# Patient Record
Sex: Female | Born: 1965
Health system: Southern US, Community
[De-identification: ages and names within clinical notes are randomized; demographics above are authoritative.]

## PROBLEM LIST (undated history)

## (undated) DIAGNOSIS — M47812 Spondylosis without myelopathy or radiculopathy, cervical region: Secondary | ICD-10-CM

## (undated) DIAGNOSIS — G43909 Migraine, unspecified, not intractable, without status migrainosus: Secondary | ICD-10-CM

## (undated) DIAGNOSIS — I10 Essential (primary) hypertension: Secondary | ICD-10-CM

## (undated) DIAGNOSIS — F445 Conversion disorder with seizures or convulsions: Secondary | ICD-10-CM

## (undated) DIAGNOSIS — R630 Anorexia: Secondary | ICD-10-CM

## (undated) DIAGNOSIS — F32A Depression, unspecified: Secondary | ICD-10-CM

## (undated) DIAGNOSIS — F329 Major depressive disorder, single episode, unspecified: Secondary | ICD-10-CM

## (undated) DIAGNOSIS — E785 Hyperlipidemia, unspecified: Secondary | ICD-10-CM

## (undated) DIAGNOSIS — F419 Anxiety disorder, unspecified: Secondary | ICD-10-CM

## (undated) DIAGNOSIS — F1010 Alcohol abuse, uncomplicated: Secondary | ICD-10-CM

## (undated) DIAGNOSIS — R569 Unspecified convulsions: Secondary | ICD-10-CM

## (undated) DIAGNOSIS — Z452 Encounter for adjustment and management of vascular access device: Secondary | ICD-10-CM

## (undated) HISTORY — PX: CARPAL TUNNEL RELEASE: SHX101

## (undated) HISTORY — PX: OTHER SURGICAL HISTORY: SHX169

## (undated) HISTORY — PX: CHOLECYSTECTOMY: SHX55

## (undated) HISTORY — PX: KNEE SURGERY: SHX244

## (undated) HISTORY — DX: Hyperlipidemia, unspecified: E78.5

## (undated) HISTORY — PX: NASAL SINUS SURGERY: SHX719

## (undated) HISTORY — PX: APPENDECTOMY: SHX54

## (undated) HISTORY — PX: ABDOMINAL SURGERY: SHX537

## (undated) HISTORY — PX: ABDOMINAL HYSTERECTOMY: SHX81

## (undated) HISTORY — DX: Essential (primary) hypertension: I10

---

## 1998-06-02 ENCOUNTER — Emergency Department (HOSPITAL_COMMUNITY): Admission: EM | Admit: 1998-06-02 | Discharge: 1998-06-02 | Payer: Self-pay | Admitting: Internal Medicine

## 1998-08-02 ENCOUNTER — Emergency Department (HOSPITAL_COMMUNITY): Admission: EM | Admit: 1998-08-02 | Discharge: 1998-08-02 | Payer: Self-pay | Admitting: Emergency Medicine

## 1998-08-02 ENCOUNTER — Encounter: Payer: Self-pay | Admitting: Emergency Medicine

## 1998-08-12 ENCOUNTER — Other Ambulatory Visit: Admission: RE | Admit: 1998-08-12 | Discharge: 1998-08-12 | Payer: Self-pay | Admitting: Obstetrics and Gynecology

## 1998-10-16 ENCOUNTER — Other Ambulatory Visit: Admission: RE | Admit: 1998-10-16 | Discharge: 1998-10-16 | Payer: Self-pay | Admitting: Obstetrics and Gynecology

## 1998-10-19 ENCOUNTER — Emergency Department (HOSPITAL_COMMUNITY): Admission: EM | Admit: 1998-10-19 | Discharge: 1998-10-19 | Payer: Self-pay | Admitting: Emergency Medicine

## 1998-11-11 ENCOUNTER — Ambulatory Visit (HOSPITAL_COMMUNITY): Admission: RE | Admit: 1998-11-11 | Discharge: 1998-11-11 | Payer: Self-pay | Admitting: Obstetrics and Gynecology

## 1998-11-15 ENCOUNTER — Inpatient Hospital Stay (HOSPITAL_COMMUNITY): Admission: AD | Admit: 1998-11-15 | Discharge: 1998-11-15 | Payer: Self-pay | Admitting: Obstetrics and Gynecology

## 1999-02-11 ENCOUNTER — Observation Stay (HOSPITAL_COMMUNITY): Admission: EM | Admit: 1999-02-11 | Discharge: 1999-02-12 | Payer: Self-pay | Admitting: Emergency Medicine

## 1999-02-11 ENCOUNTER — Encounter: Payer: Self-pay | Admitting: Family Medicine

## 1999-02-12 ENCOUNTER — Inpatient Hospital Stay (HOSPITAL_COMMUNITY): Admission: AD | Admit: 1999-02-12 | Discharge: 1999-02-20 | Payer: Self-pay | Admitting: Psychiatry

## 1999-02-16 ENCOUNTER — Encounter: Payer: Self-pay | Admitting: Psychiatry

## 1999-02-17 ENCOUNTER — Encounter: Payer: Self-pay | Admitting: Orthopedic Surgery

## 1999-02-20 ENCOUNTER — Encounter: Admission: RE | Admit: 1999-02-20 | Discharge: 1999-02-20 | Payer: Self-pay | Admitting: Family Medicine

## 1999-02-24 ENCOUNTER — Encounter: Admission: RE | Admit: 1999-02-24 | Discharge: 1999-05-25 | Payer: Self-pay | Admitting: Orthopedic Surgery

## 1999-03-14 ENCOUNTER — Emergency Department (HOSPITAL_COMMUNITY): Admission: EM | Admit: 1999-03-14 | Discharge: 1999-03-14 | Payer: Self-pay | Admitting: *Deleted

## 1999-03-21 ENCOUNTER — Encounter: Payer: Self-pay | Admitting: Emergency Medicine

## 1999-03-21 ENCOUNTER — Emergency Department (HOSPITAL_COMMUNITY): Admission: EM | Admit: 1999-03-21 | Discharge: 1999-03-21 | Payer: Self-pay | Admitting: Emergency Medicine

## 1999-03-22 ENCOUNTER — Emergency Department (HOSPITAL_COMMUNITY): Admission: EM | Admit: 1999-03-22 | Discharge: 1999-03-22 | Payer: Self-pay | Admitting: Emergency Medicine

## 1999-06-06 ENCOUNTER — Emergency Department (HOSPITAL_COMMUNITY): Admission: EM | Admit: 1999-06-06 | Discharge: 1999-06-06 | Payer: Self-pay | Admitting: Emergency Medicine

## 1999-06-07 ENCOUNTER — Emergency Department (HOSPITAL_COMMUNITY): Admission: EM | Admit: 1999-06-07 | Discharge: 1999-06-07 | Payer: Self-pay | Admitting: Emergency Medicine

## 1999-06-07 ENCOUNTER — Encounter: Payer: Self-pay | Admitting: Emergency Medicine

## 1999-09-07 ENCOUNTER — Emergency Department (HOSPITAL_COMMUNITY): Admission: EM | Admit: 1999-09-07 | Discharge: 1999-09-07 | Payer: Self-pay | Admitting: Emergency Medicine

## 1999-10-06 ENCOUNTER — Other Ambulatory Visit: Admission: RE | Admit: 1999-10-06 | Discharge: 1999-10-06 | Payer: Self-pay | Admitting: Obstetrics and Gynecology

## 1999-11-17 ENCOUNTER — Encounter (INDEPENDENT_AMBULATORY_CARE_PROVIDER_SITE_OTHER): Payer: Self-pay

## 1999-11-17 ENCOUNTER — Observation Stay (HOSPITAL_COMMUNITY): Admission: RE | Admit: 1999-11-17 | Discharge: 1999-11-18 | Payer: Self-pay

## 1999-12-16 ENCOUNTER — Encounter: Payer: Self-pay | Admitting: Emergency Medicine

## 1999-12-16 ENCOUNTER — Emergency Department (HOSPITAL_COMMUNITY): Admission: EM | Admit: 1999-12-16 | Discharge: 1999-12-16 | Payer: Self-pay | Admitting: Emergency Medicine

## 2000-02-24 ENCOUNTER — Emergency Department (HOSPITAL_COMMUNITY): Admission: EM | Admit: 2000-02-24 | Discharge: 2000-02-24 | Payer: Self-pay | Admitting: Emergency Medicine

## 2000-02-24 ENCOUNTER — Encounter: Payer: Self-pay | Admitting: Emergency Medicine

## 2000-04-11 ENCOUNTER — Emergency Department (HOSPITAL_COMMUNITY): Admission: EM | Admit: 2000-04-11 | Discharge: 2000-04-11 | Payer: Self-pay | Admitting: Internal Medicine

## 2000-04-11 ENCOUNTER — Emergency Department (HOSPITAL_COMMUNITY): Admission: EM | Admit: 2000-04-11 | Discharge: 2000-04-11 | Payer: Self-pay | Admitting: Emergency Medicine

## 2000-04-19 ENCOUNTER — Encounter (HOSPITAL_COMMUNITY): Admission: RE | Admit: 2000-04-19 | Discharge: 2000-07-18 | Payer: Self-pay | Admitting: *Deleted

## 2000-05-11 ENCOUNTER — Emergency Department (HOSPITAL_COMMUNITY): Admission: EM | Admit: 2000-05-11 | Discharge: 2000-05-11 | Payer: Self-pay | Admitting: Emergency Medicine

## 2000-05-31 ENCOUNTER — Emergency Department (HOSPITAL_COMMUNITY): Admission: EM | Admit: 2000-05-31 | Discharge: 2000-05-31 | Payer: Self-pay | Admitting: Emergency Medicine

## 2000-07-05 ENCOUNTER — Other Ambulatory Visit: Admission: RE | Admit: 2000-07-05 | Discharge: 2000-07-05 | Payer: Self-pay | Admitting: Obstetrics and Gynecology

## 2000-07-17 ENCOUNTER — Emergency Department (HOSPITAL_COMMUNITY): Admission: EM | Admit: 2000-07-17 | Discharge: 2000-07-17 | Payer: Self-pay | Admitting: *Deleted

## 2000-07-19 ENCOUNTER — Observation Stay (HOSPITAL_COMMUNITY): Admission: RE | Admit: 2000-07-19 | Discharge: 2000-07-20 | Payer: Self-pay | Admitting: Obstetrics and Gynecology

## 2000-07-26 ENCOUNTER — Emergency Department (HOSPITAL_COMMUNITY): Admission: EM | Admit: 2000-07-26 | Discharge: 2000-07-26 | Payer: Self-pay | Admitting: *Deleted

## 2000-08-12 ENCOUNTER — Emergency Department (HOSPITAL_COMMUNITY): Admission: EM | Admit: 2000-08-12 | Discharge: 2000-08-12 | Payer: Self-pay | Admitting: Emergency Medicine

## 2000-08-13 ENCOUNTER — Emergency Department (HOSPITAL_COMMUNITY): Admission: EM | Admit: 2000-08-13 | Discharge: 2000-08-13 | Payer: Self-pay | Admitting: Emergency Medicine

## 2000-08-23 ENCOUNTER — Encounter: Payer: Self-pay | Admitting: Internal Medicine

## 2000-08-23 ENCOUNTER — Inpatient Hospital Stay (HOSPITAL_COMMUNITY): Admission: EM | Admit: 2000-08-23 | Discharge: 2000-08-25 | Payer: Self-pay | Admitting: Emergency Medicine

## 2000-09-05 ENCOUNTER — Encounter: Admission: RE | Admit: 2000-09-05 | Discharge: 2000-09-05 | Payer: Self-pay | Admitting: Internal Medicine

## 2000-10-26 ENCOUNTER — Ambulatory Visit (HOSPITAL_COMMUNITY): Admission: RE | Admit: 2000-10-26 | Discharge: 2000-10-26 | Payer: Self-pay | Admitting: Obstetrics and Gynecology

## 2000-10-29 ENCOUNTER — Emergency Department (HOSPITAL_COMMUNITY): Admission: EM | Admit: 2000-10-29 | Discharge: 2000-10-29 | Payer: Self-pay | Admitting: Emergency Medicine

## 2000-10-30 ENCOUNTER — Emergency Department (HOSPITAL_COMMUNITY): Admission: EM | Admit: 2000-10-30 | Discharge: 2000-10-30 | Payer: Self-pay | Admitting: Emergency Medicine

## 2000-10-31 ENCOUNTER — Emergency Department (HOSPITAL_COMMUNITY): Admission: EM | Admit: 2000-10-31 | Discharge: 2000-10-31 | Payer: Self-pay | Admitting: Emergency Medicine

## 2000-11-02 ENCOUNTER — Emergency Department (HOSPITAL_COMMUNITY): Admission: EM | Admit: 2000-11-02 | Discharge: 2000-11-02 | Payer: Self-pay | Admitting: Emergency Medicine

## 2000-11-02 ENCOUNTER — Encounter: Payer: Self-pay | Admitting: Emergency Medicine

## 2000-11-11 ENCOUNTER — Emergency Department (HOSPITAL_COMMUNITY): Admission: EM | Admit: 2000-11-11 | Discharge: 2000-11-12 | Payer: Self-pay | Admitting: Emergency Medicine

## 2000-11-23 ENCOUNTER — Other Ambulatory Visit: Admission: RE | Admit: 2000-11-23 | Discharge: 2000-11-23 | Payer: Self-pay | Admitting: Obstetrics and Gynecology

## 2000-11-30 ENCOUNTER — Emergency Department (HOSPITAL_COMMUNITY): Admission: EM | Admit: 2000-11-30 | Discharge: 2000-12-01 | Payer: Self-pay | Admitting: Emergency Medicine

## 2000-12-28 ENCOUNTER — Emergency Department (HOSPITAL_COMMUNITY): Admission: EM | Admit: 2000-12-28 | Discharge: 2000-12-29 | Payer: Self-pay

## 2001-01-28 ENCOUNTER — Encounter: Payer: Self-pay | Admitting: Emergency Medicine

## 2001-01-28 ENCOUNTER — Inpatient Hospital Stay (HOSPITAL_COMMUNITY): Admission: EM | Admit: 2001-01-28 | Discharge: 2001-01-30 | Payer: Self-pay | Admitting: Emergency Medicine

## 2001-02-07 ENCOUNTER — Emergency Department (HOSPITAL_COMMUNITY): Admission: EM | Admit: 2001-02-07 | Discharge: 2001-02-07 | Payer: Self-pay | Admitting: Emergency Medicine

## 2001-03-18 ENCOUNTER — Emergency Department (HOSPITAL_COMMUNITY): Admission: EM | Admit: 2001-03-18 | Discharge: 2001-03-18 | Payer: Self-pay | Admitting: Emergency Medicine

## 2001-03-20 ENCOUNTER — Emergency Department (HOSPITAL_COMMUNITY): Admission: EM | Admit: 2001-03-20 | Discharge: 2001-03-20 | Payer: Self-pay | Admitting: Emergency Medicine

## 2001-05-19 ENCOUNTER — Encounter: Payer: Self-pay | Admitting: Emergency Medicine

## 2001-05-19 ENCOUNTER — Emergency Department (HOSPITAL_COMMUNITY): Admission: EM | Admit: 2001-05-19 | Discharge: 2001-05-19 | Payer: Self-pay | Admitting: *Deleted

## 2001-07-11 ENCOUNTER — Emergency Department (HOSPITAL_COMMUNITY): Admission: EM | Admit: 2001-07-11 | Discharge: 2001-07-11 | Payer: Self-pay | Admitting: Emergency Medicine

## 2001-08-07 ENCOUNTER — Emergency Department (HOSPITAL_COMMUNITY): Admission: EM | Admit: 2001-08-07 | Discharge: 2001-08-07 | Payer: Self-pay | Admitting: Emergency Medicine

## 2001-11-09 ENCOUNTER — Emergency Department (HOSPITAL_COMMUNITY): Admission: EM | Admit: 2001-11-09 | Discharge: 2001-11-09 | Payer: Self-pay | Admitting: Emergency Medicine

## 2001-11-09 ENCOUNTER — Encounter: Payer: Self-pay | Admitting: Emergency Medicine

## 2002-01-28 ENCOUNTER — Emergency Department (HOSPITAL_COMMUNITY): Admission: EM | Admit: 2002-01-28 | Discharge: 2002-01-28 | Payer: Self-pay

## 2002-01-29 ENCOUNTER — Encounter: Payer: Self-pay | Admitting: Emergency Medicine

## 2002-01-29 ENCOUNTER — Emergency Department (HOSPITAL_COMMUNITY): Admission: EM | Admit: 2002-01-29 | Discharge: 2002-01-29 | Payer: Self-pay

## 2002-02-04 ENCOUNTER — Emergency Department (HOSPITAL_COMMUNITY): Admission: EM | Admit: 2002-02-04 | Discharge: 2002-02-04 | Payer: Self-pay | Admitting: Emergency Medicine

## 2002-02-04 ENCOUNTER — Encounter: Payer: Self-pay | Admitting: Emergency Medicine

## 2002-02-07 ENCOUNTER — Other Ambulatory Visit: Admission: RE | Admit: 2002-02-07 | Discharge: 2002-02-07 | Payer: Self-pay | Admitting: Obstetrics and Gynecology

## 2003-12-03 ENCOUNTER — Emergency Department (HOSPITAL_COMMUNITY): Admission: EM | Admit: 2003-12-03 | Discharge: 2003-12-03 | Payer: Self-pay | Admitting: Emergency Medicine

## 2003-12-22 ENCOUNTER — Emergency Department (HOSPITAL_COMMUNITY): Admission: EM | Admit: 2003-12-22 | Discharge: 2003-12-22 | Payer: Self-pay

## 2004-01-24 ENCOUNTER — Encounter (INDEPENDENT_AMBULATORY_CARE_PROVIDER_SITE_OTHER): Payer: Self-pay | Admitting: Specialist

## 2004-01-24 ENCOUNTER — Ambulatory Visit (HOSPITAL_BASED_OUTPATIENT_CLINIC_OR_DEPARTMENT_OTHER): Admission: RE | Admit: 2004-01-24 | Discharge: 2004-01-24 | Payer: Self-pay | Admitting: Orthopedic Surgery

## 2004-01-24 ENCOUNTER — Ambulatory Visit (HOSPITAL_COMMUNITY): Admission: RE | Admit: 2004-01-24 | Discharge: 2004-01-24 | Payer: Self-pay | Admitting: Orthopedic Surgery

## 2004-02-12 ENCOUNTER — Encounter: Admission: RE | Admit: 2004-02-12 | Discharge: 2004-02-12 | Payer: Self-pay | Admitting: Internal Medicine

## 2004-02-20 ENCOUNTER — Emergency Department (HOSPITAL_COMMUNITY): Admission: EM | Admit: 2004-02-20 | Discharge: 2004-02-20 | Payer: Self-pay | Admitting: Emergency Medicine

## 2004-02-24 ENCOUNTER — Emergency Department (HOSPITAL_COMMUNITY): Admission: EM | Admit: 2004-02-24 | Discharge: 2004-02-25 | Payer: Self-pay | Admitting: Emergency Medicine

## 2004-03-05 ENCOUNTER — Encounter: Admission: RE | Admit: 2004-03-05 | Discharge: 2004-03-05 | Payer: Self-pay | Admitting: Internal Medicine

## 2004-03-12 ENCOUNTER — Inpatient Hospital Stay (HOSPITAL_COMMUNITY): Admission: EM | Admit: 2004-03-12 | Discharge: 2004-03-14 | Payer: Self-pay | Admitting: Emergency Medicine

## 2004-03-31 ENCOUNTER — Emergency Department (HOSPITAL_COMMUNITY): Admission: EM | Admit: 2004-03-31 | Discharge: 2004-03-31 | Payer: Self-pay | Admitting: Emergency Medicine

## 2004-04-03 ENCOUNTER — Inpatient Hospital Stay (HOSPITAL_COMMUNITY): Admission: EM | Admit: 2004-04-03 | Discharge: 2004-04-07 | Payer: Self-pay | Admitting: Emergency Medicine

## 2004-04-07 ENCOUNTER — Inpatient Hospital Stay (HOSPITAL_COMMUNITY): Admission: RE | Admit: 2004-04-07 | Discharge: 2004-04-10 | Payer: Self-pay | Admitting: Psychiatry

## 2004-04-13 ENCOUNTER — Other Ambulatory Visit (HOSPITAL_COMMUNITY): Admission: RE | Admit: 2004-04-13 | Discharge: 2004-07-12 | Payer: Self-pay | Admitting: Psychiatry

## 2004-05-03 ENCOUNTER — Emergency Department (HOSPITAL_COMMUNITY): Admission: EM | Admit: 2004-05-03 | Discharge: 2004-05-04 | Payer: Self-pay | Admitting: Emergency Medicine

## 2004-05-08 ENCOUNTER — Emergency Department (HOSPITAL_COMMUNITY): Admission: EM | Admit: 2004-05-08 | Discharge: 2004-05-08 | Payer: Self-pay

## 2004-05-19 ENCOUNTER — Emergency Department (HOSPITAL_COMMUNITY): Admission: EM | Admit: 2004-05-19 | Discharge: 2004-05-19 | Payer: Self-pay | Admitting: Emergency Medicine

## 2004-05-20 ENCOUNTER — Emergency Department (HOSPITAL_COMMUNITY): Admission: EM | Admit: 2004-05-20 | Discharge: 2004-05-20 | Payer: Self-pay | Admitting: *Deleted

## 2004-05-26 ENCOUNTER — Emergency Department (HOSPITAL_COMMUNITY): Admission: EM | Admit: 2004-05-26 | Discharge: 2004-05-27 | Payer: Self-pay

## 2004-06-03 ENCOUNTER — Emergency Department (HOSPITAL_COMMUNITY): Admission: EM | Admit: 2004-06-03 | Discharge: 2004-06-03 | Payer: Self-pay | Admitting: Family Medicine

## 2004-06-05 ENCOUNTER — Inpatient Hospital Stay (HOSPITAL_COMMUNITY): Admission: EM | Admit: 2004-06-05 | Discharge: 2004-06-06 | Payer: Self-pay | Admitting: Emergency Medicine

## 2004-07-03 ENCOUNTER — Encounter: Admission: RE | Admit: 2004-07-03 | Discharge: 2004-07-31 | Payer: Self-pay | Admitting: Internal Medicine

## 2004-07-06 ENCOUNTER — Emergency Department (HOSPITAL_COMMUNITY): Admission: EM | Admit: 2004-07-06 | Discharge: 2004-07-06 | Payer: Self-pay | Admitting: Emergency Medicine

## 2004-08-24 ENCOUNTER — Emergency Department (HOSPITAL_COMMUNITY): Admission: EM | Admit: 2004-08-24 | Discharge: 2004-08-25 | Payer: Self-pay

## 2004-08-27 ENCOUNTER — Emergency Department (HOSPITAL_COMMUNITY): Admission: EM | Admit: 2004-08-27 | Discharge: 2004-08-27 | Payer: Self-pay | Admitting: Family Medicine

## 2004-10-06 ENCOUNTER — Emergency Department (HOSPITAL_COMMUNITY): Admission: EM | Admit: 2004-10-06 | Discharge: 2004-10-06 | Payer: Self-pay | Admitting: Emergency Medicine

## 2004-10-07 ENCOUNTER — Emergency Department (HOSPITAL_COMMUNITY): Admission: EM | Admit: 2004-10-07 | Discharge: 2004-10-07 | Payer: Self-pay | Admitting: Emergency Medicine

## 2004-10-12 ENCOUNTER — Encounter
Admission: RE | Admit: 2004-10-12 | Discharge: 2005-01-10 | Payer: Self-pay | Admitting: Physical Medicine & Rehabilitation

## 2004-10-14 ENCOUNTER — Ambulatory Visit: Payer: Self-pay | Admitting: Physical Medicine & Rehabilitation

## 2004-10-15 ENCOUNTER — Emergency Department (HOSPITAL_COMMUNITY): Admission: EM | Admit: 2004-10-15 | Discharge: 2004-10-15 | Payer: Self-pay | Admitting: Emergency Medicine

## 2004-10-25 ENCOUNTER — Emergency Department (HOSPITAL_COMMUNITY): Admission: EM | Admit: 2004-10-25 | Discharge: 2004-10-26 | Payer: Self-pay | Admitting: Emergency Medicine

## 2004-10-28 ENCOUNTER — Emergency Department (HOSPITAL_COMMUNITY): Admission: EM | Admit: 2004-10-28 | Discharge: 2004-10-28 | Payer: Self-pay | Admitting: Emergency Medicine

## 2004-11-01 ENCOUNTER — Emergency Department (HOSPITAL_COMMUNITY): Admission: EM | Admit: 2004-11-01 | Discharge: 2004-11-02 | Payer: Self-pay | Admitting: Emergency Medicine

## 2004-11-01 ENCOUNTER — Emergency Department (HOSPITAL_COMMUNITY): Admission: EM | Admit: 2004-11-01 | Discharge: 2004-11-01 | Payer: Self-pay | Admitting: Emergency Medicine

## 2004-11-04 ENCOUNTER — Emergency Department (HOSPITAL_COMMUNITY): Admission: EM | Admit: 2004-11-04 | Discharge: 2004-11-04 | Payer: Self-pay | Admitting: Emergency Medicine

## 2004-11-14 ENCOUNTER — Emergency Department (HOSPITAL_COMMUNITY): Admission: EM | Admit: 2004-11-14 | Discharge: 2004-11-14 | Payer: Self-pay | Admitting: Emergency Medicine

## 2004-12-07 ENCOUNTER — Ambulatory Visit: Payer: Self-pay | Admitting: Physical Medicine & Rehabilitation

## 2004-12-28 ENCOUNTER — Emergency Department (HOSPITAL_COMMUNITY): Admission: EM | Admit: 2004-12-28 | Discharge: 2004-12-28 | Payer: Self-pay | Admitting: Emergency Medicine

## 2005-01-02 ENCOUNTER — Emergency Department (HOSPITAL_COMMUNITY): Admission: EM | Admit: 2005-01-02 | Discharge: 2005-01-02 | Payer: Self-pay | Admitting: Emergency Medicine

## 2005-01-15 ENCOUNTER — Emergency Department (HOSPITAL_COMMUNITY): Admission: EM | Admit: 2005-01-15 | Discharge: 2005-01-15 | Payer: Self-pay | Admitting: Emergency Medicine

## 2005-02-02 ENCOUNTER — Encounter
Admission: RE | Admit: 2005-02-02 | Discharge: 2005-05-03 | Payer: Self-pay | Admitting: Physical Medicine & Rehabilitation

## 2005-02-18 ENCOUNTER — Ambulatory Visit: Payer: Self-pay | Admitting: Physical Medicine & Rehabilitation

## 2005-02-22 ENCOUNTER — Emergency Department (HOSPITAL_COMMUNITY): Admission: EM | Admit: 2005-02-22 | Discharge: 2005-02-22 | Payer: Self-pay | Admitting: Emergency Medicine

## 2005-04-05 ENCOUNTER — Encounter: Admission: RE | Admit: 2005-04-05 | Discharge: 2005-04-05 | Payer: Self-pay | Admitting: Internal Medicine

## 2005-04-28 ENCOUNTER — Ambulatory Visit: Payer: Self-pay | Admitting: Physical Medicine & Rehabilitation

## 2005-04-30 ENCOUNTER — Emergency Department (HOSPITAL_COMMUNITY): Admission: EM | Admit: 2005-04-30 | Discharge: 2005-04-30 | Payer: Self-pay | Admitting: Emergency Medicine

## 2005-05-12 ENCOUNTER — Emergency Department (HOSPITAL_COMMUNITY): Admission: EM | Admit: 2005-05-12 | Discharge: 2005-05-12 | Payer: Self-pay | Admitting: Emergency Medicine

## 2005-05-19 ENCOUNTER — Encounter
Admission: RE | Admit: 2005-05-19 | Discharge: 2005-05-19 | Payer: Self-pay | Admitting: Physical Medicine & Rehabilitation

## 2005-05-25 ENCOUNTER — Encounter
Admission: RE | Admit: 2005-05-25 | Discharge: 2005-08-23 | Payer: Self-pay | Admitting: Physical Medicine & Rehabilitation

## 2005-06-09 ENCOUNTER — Emergency Department (HOSPITAL_COMMUNITY): Admission: EM | Admit: 2005-06-09 | Discharge: 2005-06-09 | Payer: Self-pay | Admitting: Emergency Medicine

## 2005-06-15 ENCOUNTER — Emergency Department (HOSPITAL_COMMUNITY): Admission: EM | Admit: 2005-06-15 | Discharge: 2005-06-16 | Payer: Self-pay | Admitting: Emergency Medicine

## 2005-06-25 ENCOUNTER — Emergency Department (HOSPITAL_COMMUNITY): Admission: EM | Admit: 2005-06-25 | Discharge: 2005-06-26 | Payer: Self-pay | Admitting: Emergency Medicine

## 2005-06-26 ENCOUNTER — Emergency Department (HOSPITAL_COMMUNITY): Admission: EM | Admit: 2005-06-26 | Discharge: 2005-06-26 | Payer: Self-pay | Admitting: Family Medicine

## 2005-07-05 ENCOUNTER — Emergency Department (HOSPITAL_COMMUNITY): Admission: EM | Admit: 2005-07-05 | Discharge: 2005-07-05 | Payer: Self-pay | Admitting: Emergency Medicine

## 2005-07-08 ENCOUNTER — Ambulatory Visit: Payer: Self-pay | Admitting: Physical Medicine & Rehabilitation

## 2005-07-29 ENCOUNTER — Emergency Department (HOSPITAL_COMMUNITY): Admission: EM | Admit: 2005-07-29 | Discharge: 2005-07-30 | Payer: Self-pay | Admitting: Emergency Medicine

## 2005-10-09 ENCOUNTER — Observation Stay (HOSPITAL_COMMUNITY): Admission: EM | Admit: 2005-10-09 | Discharge: 2005-10-11 | Payer: Self-pay | Admitting: *Deleted

## 2005-10-09 ENCOUNTER — Ambulatory Visit: Payer: Self-pay | Admitting: Family Medicine

## 2005-10-13 ENCOUNTER — Encounter
Admission: RE | Admit: 2005-10-13 | Discharge: 2006-01-11 | Payer: Self-pay | Admitting: Physical Medicine & Rehabilitation

## 2005-10-22 ENCOUNTER — Observation Stay (HOSPITAL_COMMUNITY): Admission: EM | Admit: 2005-10-22 | Discharge: 2005-10-24 | Payer: Self-pay | Admitting: Emergency Medicine

## 2005-10-24 ENCOUNTER — Inpatient Hospital Stay (HOSPITAL_COMMUNITY): Admission: AD | Admit: 2005-10-24 | Discharge: 2005-10-29 | Payer: Self-pay | Admitting: Psychiatry

## 2005-10-25 ENCOUNTER — Ambulatory Visit: Payer: Self-pay | Admitting: Psychiatry

## 2005-10-28 ENCOUNTER — Emergency Department (HOSPITAL_COMMUNITY): Admission: EM | Admit: 2005-10-28 | Discharge: 2005-10-29 | Payer: Self-pay | Admitting: Emergency Medicine

## 2005-11-09 ENCOUNTER — Ambulatory Visit: Payer: Self-pay | Admitting: Family Medicine

## 2005-11-09 ENCOUNTER — Inpatient Hospital Stay (HOSPITAL_COMMUNITY): Admission: EM | Admit: 2005-11-09 | Discharge: 2005-11-11 | Payer: Self-pay | Admitting: Emergency Medicine

## 2005-11-25 ENCOUNTER — Ambulatory Visit: Payer: Self-pay | Admitting: Physical Medicine & Rehabilitation

## 2005-12-28 ENCOUNTER — Emergency Department (HOSPITAL_COMMUNITY): Admission: EM | Admit: 2005-12-28 | Discharge: 2005-12-28 | Payer: Self-pay | Admitting: Emergency Medicine

## 2006-01-22 ENCOUNTER — Ambulatory Visit: Payer: Self-pay | Admitting: Physical Medicine & Rehabilitation

## 2006-01-22 ENCOUNTER — Encounter
Admission: RE | Admit: 2006-01-22 | Discharge: 2006-04-22 | Payer: Self-pay | Admitting: Physical Medicine & Rehabilitation

## 2006-04-19 ENCOUNTER — Ambulatory Visit: Payer: Self-pay | Admitting: Physical Medicine & Rehabilitation

## 2006-04-25 ENCOUNTER — Encounter
Admission: RE | Admit: 2006-04-25 | Discharge: 2006-07-24 | Payer: Self-pay | Admitting: Physical Medicine & Rehabilitation

## 2006-07-15 ENCOUNTER — Ambulatory Visit: Payer: Self-pay | Admitting: Physical Medicine & Rehabilitation

## 2006-09-07 ENCOUNTER — Encounter
Admission: RE | Admit: 2006-09-07 | Discharge: 2006-12-06 | Payer: Self-pay | Admitting: Physical Medicine & Rehabilitation

## 2006-09-23 ENCOUNTER — Ambulatory Visit: Payer: Self-pay | Admitting: Physical Medicine & Rehabilitation

## 2006-12-22 ENCOUNTER — Encounter
Admission: RE | Admit: 2006-12-22 | Discharge: 2007-03-22 | Payer: Self-pay | Admitting: Physical Medicine & Rehabilitation

## 2007-02-03 ENCOUNTER — Ambulatory Visit: Payer: Self-pay | Admitting: Physical Medicine & Rehabilitation

## 2007-05-23 ENCOUNTER — Ambulatory Visit: Payer: Self-pay | Admitting: Physical Medicine & Rehabilitation

## 2007-05-23 ENCOUNTER — Encounter
Admission: RE | Admit: 2007-05-23 | Discharge: 2007-05-24 | Payer: Self-pay | Admitting: Physical Medicine & Rehabilitation

## 2007-07-25 ENCOUNTER — Ambulatory Visit: Payer: Self-pay | Admitting: Physical Medicine & Rehabilitation

## 2007-07-26 ENCOUNTER — Encounter
Admission: RE | Admit: 2007-07-26 | Discharge: 2007-07-26 | Payer: Self-pay | Admitting: Physical Medicine & Rehabilitation

## 2007-10-12 ENCOUNTER — Ambulatory Visit: Payer: Self-pay | Admitting: Physical Medicine & Rehabilitation

## 2007-10-12 ENCOUNTER — Encounter
Admission: RE | Admit: 2007-10-12 | Discharge: 2007-10-16 | Payer: Self-pay | Admitting: Physical Medicine & Rehabilitation

## 2008-01-08 ENCOUNTER — Encounter
Admission: RE | Admit: 2008-01-08 | Discharge: 2008-01-12 | Payer: Self-pay | Admitting: Physical Medicine & Rehabilitation

## 2008-01-12 ENCOUNTER — Ambulatory Visit: Payer: Self-pay | Admitting: Physical Medicine & Rehabilitation

## 2008-04-18 ENCOUNTER — Encounter
Admission: RE | Admit: 2008-04-18 | Discharge: 2008-04-19 | Payer: Self-pay | Admitting: Physical Medicine & Rehabilitation

## 2008-04-19 ENCOUNTER — Ambulatory Visit: Payer: Self-pay | Admitting: Physical Medicine & Rehabilitation

## 2008-08-27 ENCOUNTER — Encounter
Admission: RE | Admit: 2008-08-27 | Discharge: 2008-08-27 | Payer: Self-pay | Admitting: Physical Medicine & Rehabilitation

## 2008-08-28 ENCOUNTER — Emergency Department (HOSPITAL_COMMUNITY): Admission: EM | Admit: 2008-08-28 | Discharge: 2008-08-28 | Payer: Self-pay | Admitting: Emergency Medicine

## 2009-01-12 ENCOUNTER — Emergency Department (HOSPITAL_COMMUNITY): Admission: EM | Admit: 2009-01-12 | Discharge: 2009-01-12 | Payer: Self-pay | Admitting: Emergency Medicine

## 2009-07-15 ENCOUNTER — Encounter: Admission: RE | Admit: 2009-07-15 | Discharge: 2009-07-15 | Payer: Self-pay | Admitting: Internal Medicine

## 2009-07-16 ENCOUNTER — Emergency Department (HOSPITAL_COMMUNITY): Admission: EM | Admit: 2009-07-16 | Discharge: 2009-07-16 | Payer: Self-pay | Admitting: Emergency Medicine

## 2009-09-02 ENCOUNTER — Inpatient Hospital Stay (HOSPITAL_COMMUNITY): Admission: EM | Admit: 2009-09-02 | Discharge: 2009-09-06 | Payer: Self-pay | Admitting: Emergency Medicine

## 2009-09-06 ENCOUNTER — Ambulatory Visit: Payer: Self-pay | Admitting: Psychiatry

## 2009-09-06 ENCOUNTER — Inpatient Hospital Stay (HOSPITAL_COMMUNITY): Admission: RE | Admit: 2009-09-06 | Discharge: 2009-09-10 | Payer: Self-pay | Admitting: Psychiatry

## 2009-12-18 ENCOUNTER — Emergency Department (HOSPITAL_COMMUNITY): Admission: EM | Admit: 2009-12-18 | Discharge: 2009-12-18 | Payer: Self-pay | Admitting: Emergency Medicine

## 2010-01-01 ENCOUNTER — Emergency Department (HOSPITAL_COMMUNITY): Admission: EM | Admit: 2010-01-01 | Discharge: 2010-01-02 | Payer: Self-pay | Admitting: Emergency Medicine

## 2010-01-03 ENCOUNTER — Emergency Department (HOSPITAL_COMMUNITY): Admission: EM | Admit: 2010-01-03 | Discharge: 2010-01-03 | Payer: Self-pay | Admitting: Emergency Medicine

## 2010-01-04 ENCOUNTER — Emergency Department (HOSPITAL_COMMUNITY): Admission: EM | Admit: 2010-01-04 | Discharge: 2010-01-04 | Payer: Self-pay | Admitting: Emergency Medicine

## 2010-01-06 ENCOUNTER — Emergency Department (HOSPITAL_COMMUNITY): Admission: EM | Admit: 2010-01-06 | Discharge: 2010-01-06 | Payer: Self-pay | Admitting: Emergency Medicine

## 2010-01-07 ENCOUNTER — Emergency Department (HOSPITAL_COMMUNITY): Admission: EM | Admit: 2010-01-07 | Discharge: 2010-01-07 | Payer: Self-pay | Admitting: Family Medicine

## 2010-01-08 ENCOUNTER — Observation Stay (HOSPITAL_COMMUNITY): Admission: EM | Admit: 2010-01-08 | Discharge: 2010-01-10 | Payer: Self-pay | Admitting: Emergency Medicine

## 2010-01-08 ENCOUNTER — Ambulatory Visit: Payer: Self-pay | Admitting: Cardiology

## 2010-01-09 ENCOUNTER — Encounter (INDEPENDENT_AMBULATORY_CARE_PROVIDER_SITE_OTHER): Payer: Self-pay | Admitting: Internal Medicine

## 2010-01-14 ENCOUNTER — Emergency Department (HOSPITAL_COMMUNITY): Admission: EM | Admit: 2010-01-14 | Discharge: 2010-01-14 | Payer: Self-pay | Admitting: Emergency Medicine

## 2010-01-17 ENCOUNTER — Emergency Department (HOSPITAL_COMMUNITY): Admission: EM | Admit: 2010-01-17 | Discharge: 2010-01-18 | Payer: Self-pay | Admitting: Emergency Medicine

## 2010-02-13 ENCOUNTER — Emergency Department (HOSPITAL_COMMUNITY): Admission: EM | Admit: 2010-02-13 | Discharge: 2010-02-13 | Payer: Self-pay | Admitting: Emergency Medicine

## 2010-02-14 ENCOUNTER — Emergency Department (HOSPITAL_COMMUNITY): Admission: EM | Admit: 2010-02-14 | Discharge: 2010-02-15 | Payer: Self-pay | Admitting: Emergency Medicine

## 2010-05-25 ENCOUNTER — Emergency Department (HOSPITAL_COMMUNITY): Admission: EM | Admit: 2010-05-25 | Discharge: 2010-05-25 | Payer: Self-pay | Admitting: Emergency Medicine

## 2010-06-08 ENCOUNTER — Emergency Department (HOSPITAL_COMMUNITY): Admission: EM | Admit: 2010-06-08 | Discharge: 2010-06-09 | Payer: Self-pay | Admitting: Emergency Medicine

## 2010-06-11 ENCOUNTER — Encounter: Admission: RE | Admit: 2010-06-11 | Discharge: 2010-06-11 | Payer: Self-pay | Admitting: Specialist

## 2010-10-01 ENCOUNTER — Emergency Department (HOSPITAL_COMMUNITY)
Admission: EM | Admit: 2010-10-01 | Discharge: 2010-10-01 | Payer: Self-pay | Source: Home / Self Care | Admitting: Family Medicine

## 2010-10-03 ENCOUNTER — Emergency Department (HOSPITAL_COMMUNITY)
Admission: EM | Admit: 2010-10-03 | Discharge: 2010-10-04 | Payer: Self-pay | Source: Home / Self Care | Admitting: Emergency Medicine

## 2010-10-10 ENCOUNTER — Emergency Department (HOSPITAL_COMMUNITY)
Admission: EM | Admit: 2010-10-10 | Discharge: 2010-10-10 | Payer: Self-pay | Source: Home / Self Care | Admitting: Emergency Medicine

## 2010-10-10 ENCOUNTER — Encounter: Payer: Self-pay | Admitting: Family Medicine

## 2010-10-13 LAB — CBC
Hemoglobin: 13.4 g/dL (ref 12.0–15.0)
MCHC: 35.4 g/dL (ref 30.0–36.0)
MCV: 90.2 fL (ref 78.0–100.0)
Platelets: 417 10*3/uL — ABNORMAL HIGH (ref 150–400)
RDW: 12.8 % (ref 11.5–15.5)

## 2010-10-13 LAB — DIFFERENTIAL
Basophils Absolute: 0 10*3/uL (ref 0.0–0.1)
Lymphocytes Relative: 46 % (ref 12–46)
Lymphs Abs: 3.6 10*3/uL (ref 0.7–4.0)
Monocytes Absolute: 0.5 10*3/uL (ref 0.1–1.0)

## 2010-10-13 LAB — BASIC METABOLIC PANEL
BUN: 9 mg/dL (ref 6–23)
CO2: 28 mEq/L (ref 19–32)
Calcium: 9.9 mg/dL (ref 8.4–10.5)
Creatinine, Ser: 0.64 mg/dL (ref 0.4–1.2)
GFR calc non Af Amer: >60 mL/min (ref >60)
Sodium: 136 mEq/L (ref 135–145)

## 2010-10-13 LAB — CK TOTAL AND CKMB (NOT AT ARMC)
CK, MB: 3.8 ng/mL (ref 0.3–4.0)
Total CK: 191 U/L — ABNORMAL HIGH (ref 7–177)

## 2010-12-03 LAB — POCT CARDIAC MARKERS
Myoglobin, poc: 108 ng/mL (ref 12–200)
Myoglobin, poc: 43.8 ng/mL (ref 12–200)
Troponin i, poc: <0.05 ng/mL (ref 0.00–0.09)
Troponin i, poc: <0.05 ng/mL (ref 0.00–0.09)
Troponin i, poc: <0.05 ng/mL (ref 0.00–0.09)

## 2010-12-03 LAB — CBC
HCT: 42 % (ref 36.0–46.0)
Hemoglobin: 14.6 g/dL (ref 12.0–15.0)
MCHC: 34.6 g/dL (ref 30.0–36.0)
MCV: 92.9 fL (ref 78.0–100.0)

## 2010-12-03 LAB — POCT I-STAT, CHEM 8
BUN: 11 mg/dL (ref 6–23)
Calcium, Ion: 1.16 mmol/L (ref 1.12–1.32)
Chloride: 102 mEq/L (ref 96–112)
Glucose, Bld: 89 mg/dL (ref 70–99)
HCT: 47 % — ABNORMAL HIGH (ref 36.0–46.0)

## 2010-12-03 LAB — DIFFERENTIAL
Basophils Relative: 1 % (ref 0–1)
Monocytes Absolute: 0.5 10*3/uL (ref 0.1–1.0)
Monocytes Relative: 5 % (ref 3–12)
Neutro Abs: 5.7 10*3/uL (ref 1.7–7.7)

## 2010-12-08 LAB — COMPREHENSIVE METABOLIC PANEL
ALT: 24 U/L (ref 0–35)
ALT: 27 U/L (ref 0–35)
ALT: 62 U/L — ABNORMAL HIGH (ref 0–35)
ALT: 75 U/L — ABNORMAL HIGH (ref 0–35)
AST: 20 U/L (ref 0–37)
AST: 22 U/L (ref 0–37)
AST: 25 U/L (ref 0–37)
AST: 28 U/L (ref 0–37)
Albumin: 3.3 g/dL — ABNORMAL LOW (ref 3.5–5.2)
Albumin: 3.8 g/dL (ref 3.5–5.2)
Albumin: 3.8 g/dL (ref 3.5–5.2)
Alkaline Phosphatase: 120 U/L — ABNORMAL HIGH (ref 39–117)
Alkaline Phosphatase: 98 U/L (ref 39–117)
Alkaline Phosphatase: 99 U/L (ref 39–117)
BUN: 7 mg/dL (ref 6–23)
BUN: 8 mg/dL (ref 6–23)
CO2: 25 mEq/L (ref 19–32)
CO2: 25 mEq/L (ref 19–32)
CO2: 29 mEq/L (ref 19–32)
Calcium: 8.8 mg/dL (ref 8.4–10.5)
Calcium: 9.7 mg/dL (ref 8.4–10.5)
Chloride: 103 mEq/L (ref 96–112)
Chloride: 105 mEq/L (ref 96–112)
Chloride: 106 mEq/L (ref 96–112)
Chloride: 97 mEq/L (ref 96–112)
Creatinine, Ser: 0.57 mg/dL (ref 0.4–1.2)
GFR calc Af Amer: >60 mL/min (ref >60)
GFR calc Af Amer: >60 mL/min (ref >60)
GFR calc Af Amer: >60 mL/min (ref >60)
GFR calc Af Amer: >60 mL/min (ref >60)
GFR calc non Af Amer: >60 mL/min (ref >60)
GFR calc non Af Amer: >60 mL/min (ref >60)
GFR calc non Af Amer: >60 mL/min (ref >60)
Glucose, Bld: 103 mg/dL — ABNORMAL HIGH (ref 70–99)
Potassium: 3.5 mEq/L (ref 3.5–5.1)
Potassium: 3.6 mEq/L (ref 3.5–5.1)
Potassium: 3.7 mEq/L (ref 3.5–5.1)
Potassium: 3.8 mEq/L (ref 3.5–5.1)
Sodium: 134 mEq/L — ABNORMAL LOW (ref 135–145)
Sodium: 136 mEq/L (ref 135–145)
Sodium: 139 mEq/L (ref 135–145)
Sodium: 139 mEq/L (ref 135–145)
Total Bilirubin: 0.4 mg/dL (ref 0.3–1.2)
Total Bilirubin: 0.5 mg/dL (ref 0.3–1.2)
Total Bilirubin: 0.5 mg/dL (ref 0.3–1.2)
Total Protein: 6 g/dL (ref 6.0–8.3)
Total Protein: 7 g/dL (ref 6.0–8.3)

## 2010-12-08 LAB — AMMONIA
Ammonia: 29 umol/L (ref 11–35)
Ammonia: 32 umol/L (ref 11–35)

## 2010-12-08 LAB — DIFFERENTIAL
Eosinophils Absolute: 0.2 10*3/uL (ref 0.0–0.7)
Eosinophils Relative: 1 % (ref 0–5)
Lymphs Abs: 5.9 10*3/uL — ABNORMAL HIGH (ref 0.7–4.0)
Monocytes Absolute: 0.7 10*3/uL (ref 0.1–1.0)

## 2010-12-08 LAB — CLOSTRIDIUM DIFFICILE EIA
C difficile Toxins A+B, EIA: NEGATIVE
C difficile Toxins A+B, EIA: NEGATIVE

## 2010-12-08 LAB — URINALYSIS, ROUTINE W REFLEX MICROSCOPIC
Bilirubin Urine: NEGATIVE
Nitrite: NEGATIVE
Nitrite: NEGATIVE
Specific Gravity, Urine: 1.011 (ref 1.005–1.030)
Specific Gravity, Urine: 1.011 (ref 1.005–1.030)
Urobilinogen, UA: 0.2 mg/dL (ref 0.0–1.0)
pH: 5.5 (ref 5.0–8.0)
pH: 7 (ref 5.0–8.0)

## 2010-12-08 LAB — POCT I-STAT 3, ART BLOOD GAS (G3+)
Acid-Base Excess: 6 mmol/L — ABNORMAL HIGH (ref 0.0–2.0)
Bicarbonate: 30 mEq/L — ABNORMAL HIGH (ref 20.0–24.0)
Patient temperature: 98.6
TCO2: 31 mmol/L (ref 0–100)
pH, Arterial: 7.486 — ABNORMAL HIGH (ref 7.350–7.400)
pO2, Arterial: 61 mmHg — ABNORMAL LOW (ref 80.0–100.0)

## 2010-12-08 LAB — CBC
HCT: 36.4 % (ref 36.0–46.0)
HCT: 38.7 % (ref 36.0–46.0)
Hemoglobin: 12.8 g/dL (ref 12.0–15.0)
MCV: 95 fL (ref 78.0–100.0)
Platelets: 352 10*3/uL (ref 150–400)
Platelets: ADEQUATE 10*3/uL (ref 150–400)
RBC: 3.74 MIL/uL — ABNORMAL LOW (ref 3.87–5.11)
RBC: 3.83 MIL/uL — ABNORMAL LOW (ref 3.87–5.11)
RDW: 13.5 % (ref 11.5–15.5)
WBC: 11.1 10*3/uL — ABNORMAL HIGH (ref 4.0–10.5)
WBC: 17.5 10*3/uL — ABNORMAL HIGH (ref 4.0–10.5)
WBC: 8.6 10*3/uL (ref 4.0–10.5)

## 2010-12-08 LAB — POCT I-STAT, CHEM 8
BUN: 17 mg/dL (ref 6–23)
BUN: 9 mg/dL (ref 6–23)
BUN: 9 mg/dL (ref 6–23)
Calcium, Ion: 1.03 mmol/L — ABNORMAL LOW (ref 1.12–1.32)
Calcium, Ion: 1.17 mmol/L (ref 1.12–1.32)
Chloride: 104 mEq/L (ref 96–112)
Creatinine, Ser: 0.8 mg/dL (ref 0.4–1.2)
Glucose, Bld: 106 mg/dL — ABNORMAL HIGH (ref 70–99)
Glucose, Bld: 70 mg/dL (ref 70–99)
Glucose, Bld: 86 mg/dL (ref 70–99)
Hemoglobin: 12.6 g/dL (ref 12.0–15.0)
Potassium: 3.7 mEq/L (ref 3.5–5.1)
TCO2: 28 mmol/L (ref 0–100)
TCO2: 30 mmol/L (ref 0–100)

## 2010-12-08 LAB — POCT CARDIAC MARKERS

## 2010-12-08 LAB — HEPATIC FUNCTION PANEL
ALT: 86 U/L — ABNORMAL HIGH (ref 0–35)
AST: 64 U/L — ABNORMAL HIGH (ref 0–37)
Alkaline Phosphatase: 111 U/L (ref 39–117)
Bilirubin, Direct: 0.2 mg/dL (ref 0.0–0.3)
Indirect Bilirubin: 0.3 mg/dL (ref 0.3–0.9)

## 2010-12-08 LAB — CARDIAC PANEL(CRET KIN+CKTOT+MB+TROPI)
CK, MB: 0.9 ng/mL (ref 0.3–4.0)
CK, MB: 1 ng/mL (ref 0.3–4.0)
Relative Index: INVALID (ref 0.0–2.5)
Relative Index: INVALID (ref 0.0–2.5)
Relative Index: INVALID (ref 0.0–2.5)
Total CK: 60 U/L (ref 7–177)
Total CK: 64 U/L (ref 7–177)
Troponin I: 0.03 ng/mL (ref 0.00–0.06)

## 2010-12-08 LAB — HEPATITIS PANEL, ACUTE: Hepatitis B Surface Ag: NEGATIVE

## 2010-12-08 LAB — ACETAMINOPHEN LEVEL: Acetaminophen (Tylenol), Serum: <10 ug/mL — ABNORMAL LOW (ref 10–30)

## 2010-12-08 LAB — HEPATITIS B SURFACE ANTIBODY,QUALITATIVE: Hep B S Ab: POSITIVE — AB

## 2010-12-08 LAB — RAPID URINE DRUG SCREEN, HOSP PERFORMED
Barbiturates: NOT DETECTED
Opiates: POSITIVE — AB

## 2010-12-08 LAB — SALICYLATE LEVEL: Salicylate Lvl: <4 mg/dL (ref 2.8–20.0)

## 2010-12-08 LAB — APTT: aPTT: 26 seconds (ref 24–37)

## 2010-12-08 LAB — TSH: TSH: 0.373 u[IU]/mL (ref 0.350–4.500)

## 2010-12-08 LAB — LIPASE, BLOOD: Lipase: 34 U/L (ref 11–59)

## 2010-12-08 LAB — POCT PREGNANCY, URINE: Preg Test, Ur: NEGATIVE

## 2010-12-08 LAB — PREGNANCY, URINE: Preg Test, Ur: NEGATIVE

## 2010-12-21 LAB — COMPREHENSIVE METABOLIC PANEL
ALT: 581 U/L — ABNORMAL HIGH (ref 0–35)
AST: 276 U/L — ABNORMAL HIGH (ref 0–37)
AST: 97 U/L — ABNORMAL HIGH (ref 0–37)
Albumin: 3.4 g/dL — ABNORMAL LOW (ref 3.5–5.2)
Albumin: 3.6 g/dL (ref 3.5–5.2)
Alkaline Phosphatase: 118 U/L — ABNORMAL HIGH (ref 39–117)
BUN: 4 mg/dL — ABNORMAL LOW (ref 6–23)
BUN: 6 mg/dL (ref 6–23)
CO2: 26 mEq/L (ref 19–32)
Calcium: 9.4 mg/dL (ref 8.4–10.5)
Chloride: 105 mEq/L (ref 96–112)
Creatinine, Ser: 0.54 mg/dL (ref 0.4–1.2)
Creatinine, Ser: 0.67 mg/dL (ref 0.4–1.2)
GFR calc Af Amer: >60 mL/min (ref >60)
GFR calc Af Amer: >60 mL/min (ref >60)
GFR calc non Af Amer: >60 mL/min (ref >60)
Glucose, Bld: 99 mg/dL (ref 70–99)
Potassium: 3.7 mEq/L (ref 3.5–5.1)
Sodium: 139 mEq/L (ref 135–145)
Total Bilirubin: 0.6 mg/dL (ref 0.3–1.2)
Total Protein: 6.3 g/dL (ref 6.0–8.3)
Total Protein: 6.4 g/dL (ref 6.0–8.3)

## 2010-12-21 LAB — MAGNESIUM: Magnesium: 2.2 mg/dL (ref 1.5–2.5)

## 2010-12-21 LAB — HEPATIC FUNCTION PANEL
ALT: 1215 U/L — ABNORMAL HIGH (ref 0–35)
AST: 27 U/L (ref 0–37)
AST: 713 U/L — ABNORMAL HIGH (ref 0–37)
Albumin: 4 g/dL (ref 3.5–5.2)
Albumin: 3.6 g/dL (ref 3.5–5.2)
Alkaline Phosphatase: 120 U/L — ABNORMAL HIGH (ref 39–117)
Bilirubin, Direct: 0.1 mg/dL (ref 0.0–0.3)
Indirect Bilirubin: 0.6 mg/dL (ref 0.3–0.9)
Total Bilirubin: 0.7 mg/dL (ref 0.3–1.2)
Total Protein: 7.3 g/dL (ref 6.0–8.3)
Total Protein: 6.4 g/dL (ref 6.0–8.3)

## 2010-12-21 LAB — TSH: TSH: 0.888 u[IU]/mL (ref 0.350–4.500)

## 2010-12-21 LAB — RPR: RPR Ser Ql: NONREACTIVE

## 2010-12-21 LAB — PROTIME-INR
INR: 0.92 (ref 0.00–1.49)
INR: 1 (ref 0.00–1.49)
Prothrombin Time: 12.3 seconds (ref 11.6–15.2)
Prothrombin Time: 13.1 seconds (ref 11.6–15.2)

## 2010-12-21 LAB — PHOSPHORUS: Phosphorus: 3.1 mg/dL (ref 2.3–4.6)

## 2010-12-22 LAB — HEPATIC FUNCTION PANEL
ALT: 195 U/L — ABNORMAL HIGH (ref 0–35)
AST: 178 U/L — ABNORMAL HIGH (ref 0–37)
AST: 308 U/L — ABNORMAL HIGH (ref 0–37)
Albumin: 3.6 g/dL (ref 3.5–5.2)
Albumin: 4.3 g/dL (ref 3.5–5.2)
Alkaline Phosphatase: 133 U/L — ABNORMAL HIGH (ref 39–117)
Bilirubin, Direct: <0.1 mg/dL (ref 0.0–0.3)
Total Bilirubin: 0.6 mg/dL (ref 0.3–1.2)
Total Bilirubin: 0.8 mg/dL (ref 0.3–1.2)
Total Protein: 6.3 g/dL (ref 6.0–8.3)

## 2010-12-22 LAB — DIFFERENTIAL
Basophils Absolute: 0 10*3/uL (ref 0.0–0.1)
Eosinophils Relative: 2 % (ref 0–5)
Lymphocytes Relative: 16 % (ref 12–46)
Monocytes Absolute: 0.1 10*3/uL (ref 0.1–1.0)
Monocytes Relative: 1 % — ABNORMAL LOW (ref 3–12)
Neutro Abs: 11.8 10*3/uL — ABNORMAL HIGH (ref 1.7–7.7)

## 2010-12-22 LAB — BASIC METABOLIC PANEL
CO2: 21 mEq/L (ref 19–32)
CO2: 25 mEq/L (ref 19–32)
Calcium: 8.8 mg/dL (ref 8.4–10.5)
Calcium: 9.2 mg/dL (ref 8.4–10.5)
Creatinine, Ser: 0.64 mg/dL (ref 0.4–1.2)
GFR calc Af Amer: >60 mL/min (ref >60)
GFR calc Af Amer: >60 mL/min (ref >60)
GFR calc non Af Amer: >60 mL/min (ref >60)
GFR calc non Af Amer: >60 mL/min (ref >60)
Glucose, Bld: 120 mg/dL — ABNORMAL HIGH (ref 70–99)
Potassium: 3 mEq/L — ABNORMAL LOW (ref 3.5–5.1)
Sodium: 134 mEq/L — ABNORMAL LOW (ref 135–145)
Sodium: 136 mEq/L (ref 135–145)

## 2010-12-22 LAB — CBC
HCT: 43.5 % (ref 36.0–46.0)
Hemoglobin: 14.8 g/dL (ref 12.0–15.0)
MCHC: 34.1 g/dL (ref 30.0–36.0)
RBC: 4.51 MIL/uL (ref 3.87–5.11)
RDW: 13.5 % (ref 11.5–15.5)

## 2010-12-22 LAB — PROTIME-INR
INR: 1.04 (ref 0.00–1.49)
INR: 1.18 (ref 0.00–1.49)
Prothrombin Time: 13.5 seconds (ref 11.6–15.2)
Prothrombin Time: 14.9 seconds (ref 11.6–15.2)

## 2010-12-22 LAB — ACETAMINOPHEN LEVEL: Acetaminophen (Tylenol), Serum: <10 ug/mL — ABNORMAL LOW (ref 10–30)

## 2010-12-22 LAB — RAPID URINE DRUG SCREEN, HOSP PERFORMED
Cocaine: NOT DETECTED
Opiates: POSITIVE — AB

## 2010-12-22 LAB — APTT: aPTT: 28 seconds (ref 24–37)

## 2010-12-27 ENCOUNTER — Emergency Department (HOSPITAL_COMMUNITY)
Admission: EM | Admit: 2010-12-27 | Discharge: 2010-12-27 | Disposition: A | Discharge status: Home / Self Care | Payer: Self-pay | Attending: Emergency Medicine | Admitting: Emergency Medicine

## 2010-12-27 DIAGNOSIS — S335XXA Sprain of ligaments of lumbar spine, initial encounter: Secondary | ICD-10-CM | POA: Insufficient documentation

## 2010-12-27 DIAGNOSIS — M546 Pain in thoracic spine: Secondary | ICD-10-CM | POA: Insufficient documentation

## 2010-12-27 DIAGNOSIS — M545 Low back pain, unspecified: Secondary | ICD-10-CM | POA: Insufficient documentation

## 2010-12-27 DIAGNOSIS — M79609 Pain in unspecified limb: Secondary | ICD-10-CM | POA: Insufficient documentation

## 2010-12-27 DIAGNOSIS — X500XXA Overexertion from strenuous movement or load, initial encounter: Secondary | ICD-10-CM | POA: Insufficient documentation

## 2010-12-27 DIAGNOSIS — M5380 Other specified dorsopathies, site unspecified: Secondary | ICD-10-CM | POA: Insufficient documentation

## 2011-01-26 ENCOUNTER — Emergency Department (HOSPITAL_COMMUNITY)
Admission: EM | Admit: 2011-01-26 | Discharge: 2011-01-27 | Disposition: A | Discharge status: Home / Self Care | Payer: Self-pay | Attending: Emergency Medicine | Admitting: Emergency Medicine

## 2011-01-26 DIAGNOSIS — W108XXA Fall (on) (from) other stairs and steps, initial encounter: Secondary | ICD-10-CM | POA: Insufficient documentation

## 2011-01-26 DIAGNOSIS — Z9889 Other specified postprocedural states: Secondary | ICD-10-CM | POA: Insufficient documentation

## 2011-01-26 DIAGNOSIS — M25569 Pain in unspecified knee: Secondary | ICD-10-CM | POA: Insufficient documentation

## 2011-01-27 ENCOUNTER — Emergency Department (HOSPITAL_COMMUNITY): Payer: Self-pay

## 2011-02-02 NOTE — Assessment & Plan Note (Signed)
HISTORY OF PRESENT ILLNESS:  [name] returns to the clinic today for  followup evaluation.  She does need a refill on her morphine sulfate  over the next few days.  She also would like to have refills on her Tresa Garter  when she is next due.  She reports that she is having poor sleep at the  present time.  She cannot take Lunesta and apparently is fearful of  taking Ambien.  We have decided to try trazodone for her in the office  today.  The patient reports that she is getting reasonable pain relief  overall.   MEDICATIONS:  1. Estrace 1 mg daily.  2. Morphine sulfate immediate release 15 mg one tablet 4 times daily      p.r.n.  3. Soma 350 mg one tablet 4 times daily p.r.n.  4. Benadryl p.r.n.  5. Vitamins daily.  6. Calcium daily.   REVIEW OF SYSTEMS:  Positive for night sweats.   PHYSICAL EXAMINATION:  General:  Well-appearing, middle-aged, adult  female in mild to no acute discomfort.  Vital Signs:  Blood pressure  130/68 with a pulse of 92, respiratory rate 18, and O2 saturation 96% on  room air.  Musculoskeletal:  She has 5-/5 strength throughout.  She  ambulates without any assistive device.   IMPRESSION:  1. Mid thoracic pain related to T7-T8 herniated nucleus pulposus      without neurological compromise.  2. Degenerative disk disease of the lumbar spine, L3-L5.  3. Status post rotator cuff repair, right upper extremity, with      subsequent reinjury.   PLAN:  In the office today, we did refill the patient's morphine sulfate  immediate release as of September 10.  We also refilled her Tresa Garter as of  the end of the month.  We gave her a new script for trazodone 50 mg 2  tablets p.o. q.h.s. p.r.n. for sleep.  Will plan on seeing the patient  in followup in approximately 3 to 4 months' time with refills prior to  that appointment as necessary.           ______________________________  Ellwood Dense, M.D.     DC/MedQ  D:  05/24/2007 09:51:40  T:  05/24/2007 11:47:42   Job #:  [id number]

## 2011-02-02 NOTE — Assessment & Plan Note (Signed)
[name] returns to the clinic today for followup evaluation.  She  reports that she has been having increased pain recently.  She reports  that she has been working more hours and has a recent puppy that she is  trying to care of at home.  She does requested an adjustment in her  morphine sulfate immediate release medication.  She is presently using  15 mg 4 times per day for a total of 60 mg daily.  The patient reports  having been started back on Prozac by her psychiatrist, and presently is  up to 40 mg daily.  Pain is located in her T7-T8 area posteriorly.   MEDICATIONS:  1. Estrace 1 mg daily.  2. Morphine sulfate immediate release 15 mg 1 tablet q.i.d. p.r.n. (4      per day).  3. Soma 350 mg 1 tablet q.i.d. p.r.n. (4 per day).  4. Benadryl p.r.n.  5. Vitamins daily.  6. Calcium daily.  7. Trazodone 50 mg nightly.   REVIEW OF SYSTEMS:  Positive for weight gain, poor appetite, diarrhea,  and nausea.   PHYSICAL EXAMINATION:  GENERAL:  Reasonably well-appearing, middle-aged  adult female in mild acute discomfort.  VITAL SIGNS:  Blood pressure 120/71 with a pulse of 82, respiratory rate  22, and O2 saturation 98% on room air.  EXTREMITIES:  She ambulates without any assistive device.  She has 5-/5  strength throughout.   IMPRESSION:  1. Mid thoracic pain related to T7-T8 herniated nucleus pulposus      without neurological compromise.  2. Degenerative disk disease of the lumbar spine at L3-L5.  3. Status post rotator cuff injury, right upper extremity with      subsequent reinjury.   In the office today, we did adjust the patient's morphine sulfate  immediate release to 30 mg 1 tablet t.i.d.  This will be an increase  from 60 mg to 90 mg daily.  I have gone over with her how she can use up  to 15 mg tablets that she has at present and then she will fill the  script of the 30 mg tablets as of May 03, 2008.  So essentially she  will be taking two 15 mg tablets 3 times per  day for a total of 90 and  then switch over to 30 mg tablets 3 times a day for the same dosage.  We  will plan on seeing the patient in followup in this office in  approximately 3 weeks' time with refills prior to that appointment if  necessary.  I would like to continue the Soma as I think she does gain  better benefit than she realizes at present and I think that she would  do worse off that medication.           ______________________________  Ellwood Dense, M.D.     DC/MedQ  D:  04/19/2008 16:26:47  T:  04/20/2008 23:07:33  Job #:  [id number]

## 2011-02-02 NOTE — Assessment & Plan Note (Signed)
FOLLOW-UP VISIT   SUBJECTIVE:  [name] returns to clinic today for follow-up evaluation.  She reports that she is getting good relief from the morphine sulfate  used 15 mg three to four times per day.  She also has had to increase  her Soma up to four times a day.  During the last clinic visit, we  increased her from twice a day to three times a day, but she finds that  she still needs to add an extra dose.  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  She has had to lift some  chemical containers and those have aggravated her pain to some degree.  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   She is having some health problems involving her mother.  Apparently her  mother underwent femoral artery bypass grafting Feb 03, 2007 and is in  the Intensive Care Unit at [facility].  She is having some mild  psychosis at the present time and being agitated after prolonged  surgery.  The patient has had to spend some extra time with her and that  has also caused her problems with her sleep cycle.   MEDICATIONS:  1. Esterase 1 mg __________.  2. Morphine sulfate immediate release 15 mg one tablet q.i.d. p.r.n.      (3 to 4 per day).  3. Soma 350 mg one tablet q.i.d. p.r.n.  4. Benadryl q day p.r.n.  5. Vitamins q day.  6. Calcium q day.   REVIEW OF SYSTEMS:  Positive for night sweats.   PHYSICAL EXAMINATION:  Well-appearing, middle-aged adult female in mild  to no acute discomfort.  Blood pressure 135/78 with pulse 68,  respiratory rate 16 and O2 saturation 98 percent on room air.  She has 5-  /5 strength throughout the bilateral upper and lower extremities.  She  ambulates without any assistive device.   IMPRESSION:  1. Mid-thoracic pain related to T7-T8.  2. Herniated nucleus pulposus without neurological compromise.  3. Degenerative disk disease of the lumbar spine L3-L5 status post  rotator cuff repair, right upper extremity with subsequent      reinjury.  4. __________ no refill on medications as necessary.  We will refill      her morphine sulfate and Soma as noted above when she needs a      refill.  Those were just filled this past Friday, May, 16, 2008.      We will plan on seeing her in follow-up in approximately 3 to 4      months' time.           ______________________________  Ellwood Dense, M.D.     DC/MedQ  D:  02/06/2007 11:40:13  T:  02/06/2007 12:16:18  Job #:  [id number]

## 2011-02-02 NOTE — Assessment & Plan Note (Signed)
[name] returns to clinic today for followup evaluation.  She reports  that she is doing well although she has had some increased stress.  She  is caring for her mother in addition to working full time and still  planning to start school again in the fall.  She is also in the process  of moving and having a lot of increased pain with the extra lifting that  she has been doing.  She does need a refill on her morphine sulfate  which she uses 4 times a day.  She does get good relief overall in  combination with the Soma.  She has a sufficient supply of this Soma and  trazodone at this point.   MEDICATIONS:  1. Estrace 1 mg daily.  2. Morphine sulfate immediate release 15 mg 1 tablet q.i.d. p.r.n.  3. Soma 350 mg 1 tablet q.i.d. p.r.n.  4. Benadryl p.r.n.  5. Vitamins daily.  6. Calcium daily.  7. Trazodone 50 mg p.o. q.h.s.   REVIEW OF SYSTEMS:  Noncontributory.   PHYSICAL EXAM:  Well-appearing, middle-aged, adult female in mild to no  acute discomfort.  Blood pressure is 122/61 with a pulse of 82,  respiratory rate 20, and O2 saturation 96% on room air.  She has 5-/5  strength throughout.  She ambulates without any assistive device.   IMPRESSION:  1. Midthoracic pain related to T7-T8 herniated nucleus pulposus      without neurological compromise.  2. Degenerative disk disease of the lumbar spine at L3-L5.  3. Status post rotator cuff injury, right upper extremity, with      subsequent reinjury.   In the office today, we did refill the patient's morphine sulfate  immediate release as of January 16, 2008.  We will plan on seeing her in  followup in approximately 2 to 3 months' time with refills prior to that  appointment as necessary.  She has a sufficient supply of Soma and  trazodone at this point.           ______________________________  Ellwood Dense, M.D.     DC/MedQ  D:  01/12/2008 15:07:49  T:  01/12/2008 15:24:47  Job #:  [id number]

## 2011-02-02 NOTE — Assessment & Plan Note (Signed)
[name] returns to the clinic today for followup evaluation. She  reports that she has had some changes in her overall social situation.  She was recently laid off and plans to apply for unemployment. She is  also going back to the college where she has plans to return in January  2009 and is going to finally complete paperwork for that admission  process. She reports that she has been working out a Research officer, political party, [employer] and therapy balls along with doing crunches. She  has a history of anorexia and bulimia and feels that she is experiencing  some of the same symptoms again. She does see Dr. Sherrine Maples and she reports  that they are presently working on post-traumatic symptoms. She plans to  address the bulimia/anorexia with Dr. Sherrine Maples when she next meets. The  patient continues to attend Alcoholics Anonymous and is now sober for 21  months. 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   MEDICATIONS:  1. Estrace 1 mg daily.  2. Morphine sulfate immediate release 15 mg one tablet q.i.d. p.r.n.  3. Soma 350 mg one tablet q.i.d. p.r.n.  4. Benadryl p.r.n.  5. Vitamins daily.  6. Calcium daily.  7. Trazodone 50 mg q nightly.   REVIEW OF SYSTEMS:  Positive for night sweats, diarrhea, nausea and poor  appetite and abdominal pain along with stress.   PHYSICAL EXAMINATION:  Is a well-appearing middle aged adult female in  mild acute discomfort. Vitals were not obtained in the office today. She  ambulates without any assistive device. She has 5-/5 strength  throughout.   IMPRESSION:  1. Mid thoracic pain related to T7-T8 herniated nucleus pulposis      without neurological compromise.  2. Degenerative disc disease of the lumbar spine at L3-L5.  3. Status post rotator cuff right upper extremity with subsequent re-      injury.   In the office today, we did refill the patient's morphine sulfate. No  refill  was necessary on the Soma at this time. I have encouraged her to  definitely bring up the anorexia/bulimia symptoms that she is  experiencing with Dr. Sherrine Maples, her psychologist. Will plan on seeing the  patient in followup in this office in approximately three months time  with refills prior to that appointment as necessary.           ______________________________  Ellwood Dense, M.D.     DC/MedQ  D:  07/26/2007 11:14:05  T:  07/26/2007 19:50:00  Job #:  [id number]

## 2011-02-02 NOTE — Assessment & Plan Note (Signed)
[name] returns to clinic today for followup evaluation.  She reports  that she is trying to do more swimming at the present time.  She has  tried Pilates and yoga, but that caused increased pain of her knees.  She also reports that she is almost 2 years completed from alcohol  recovery.  She has been sober now for almost 2 years.  She reports that  she is working with a friend of hers at [employer].  They are  working on [employer] and grants for her to hopefully start  schooling, working towards a degree in social work.  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  She would then plan to go to school for Social Work and work  to support herself as a Engineer, site.   In terms of her pain medicines, she has used immediate release morphine  4 times a day along with the Soma 4 times a day.  She just has had a  refill on the Soma but does need a refill on the morphine over the next  few days.   MEDICATIONS:  1. Estrace 1 mg daily.  2. Morphine sulfate immediate release 15 mg one tablet q.i.d. p.r.n.  3. Soma 350 mg one tablet q.i.d. p.r.n.  4. Benadryl p.r.n.  5. Vitamins daily.  6. Calcium daily.  7. Trazodone 50 mg p.o. nightly.   REVIEW OF SYSTEMS:  Noncontributory.   PHYSICAL EXAMINATION:  Well-appearing, middle-aged adult female in mild  to no acute discomfort.  Blood pressure 127/73 with a pulse of 93.  Respiratory rate 18 and O2 saturation 98% on room air.  She ambulates  without any assistive device.  She has 5- over 5 strength throughout.   IMPRESSION:  1. Midthoracic pain related to T7-T8 herniated nucleus pulposis      without neurological compromise.  2. Degenerative disk disease of the lumbar spine at L3-L5.  3. Status post rotator cuff injury, right upper extremity with      subsequent re-injury.   In the office today we did refill the patient's morphine sulfate  immediate release as of October 21, 2007.  She has sufficient supply of  Soma at this point.  We will plan on seeing her in followup in  approximately 3 month's time with refills prior to that appointment as  necessary.  She is encouraged to continue her education at this point.           ______________________________  [name], M.D.     DC/MedQ  D:  10/16/2007 10:25:11  T:  10/16/2007 12:19:44  Job #:  [id number]

## 2011-02-05 NOTE — Consult Note (Signed)
NAME:  [name], [name]                          ACCOUNT NO.:  [id number]   MEDICAL RECORD NO.:  [id number]                   PATIENT TYPE:  INP   LOCATION:  0349                                 FACILITY:  [facility]   PHYSICIAN:  Anselmo Rod, M.D.               DATE OF BIRTH:  10-Aug-1966   DATE OF CONSULTATION:  03/13/2004  DATE OF DISCHARGE:  03/14/2004                                   CONSULTATION   REASON FOR CONSULTATION:  Left lower quadrant pain with diarrhea.   ASSESSMENT:  1. Left lower quadrant pain with diarrhea, question irritable bowel     syndrome, question post cholecystectomy syndrome.  2. History of occasional bright red blood per rectum with family history of     colon cancer, colonoscopy planned on July 27th.  This was scheduled on     her initial visit on March 05, 2004.  3. History of eating disorder anorexia nervosa and bulimia.  4. History endometriosis status post total abdominal hysterectomy.  5. History of laparoscopic cholecystectomy.  6. Anxiety and depression with insomnia and a history of a suicide attempt.  7. Scarlet fever infectious mononucleosis in the past.  8. Bronchitis with history of tobacco use.   RECOMMENDATIONS:  1. Prep for flexible sigmoidoscopy today.  2. Colonoscopy at a later date.  3. Avoid narcotic use.  4. Psyche evaluation.  5. Question transfer to another gastroenterologist after hospitalization     discharge at the patient's request.   DISCUSSION:  [name] is a 45 year old white female who first saw me  on March 05, 2004 when she was referred to me by Dr. Kern Reap for a GI  evaluation.  The patient presented for further evaluation of left lower  quadrant pain and diarrhea.  She says she has had nervous stomach and has  averaged about 5 loose bowel movements per day.  Over the weekend, she says  she was at her nephew's party and her diarrhea become worse reaching 10 to  15 loose bowel movements a day.  She thought  this was an infectious etiology  and was started to feel dehydration and wanted IV fluids.  She was examined  in the office again a couple of days ago and was found to have adequate  blood pressure with no evidence of dehydration, therefore it was advised to  have labs and white count was slightly elevated at 11.6 and she was advised  to do a course of Cipro and Flagyl empirically which I believe she did not  start and went to the emergency room where she was evaluated and admitted  for dehydration.  She describes having a nervous stomach and has had a lot  of stress.  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  Her symptoms seemed to be more since her gallbladder  surgery  in 2000.  On her last visit in the office, I advised her to try  cholestyramine which I believe she has not filled yet.  She also had an  anaphylactic reaction at the [facility] when a recent CT was done which  was essentially unrevealing.  The patient continues to have this complaint  and was therefore hospitalized as mentioned above.   ALLERGIES:  The patient has multiple DRUG ALLERGIES including IBUPROFEN,  NAPROSYN, SODIUM SULFA, AMITRIPTYLINE, LIDOCAINE, X-RAY DYE, STEROIDS, and  COMPAZINE.   MEDICATIONS:  Klonopin, Zanaflex, estradiol, Phenergan p.r.n., Thorazine,  Robaxin, Lexapro, Effexor, and Actonel.   PAST MEDICAL HISTORY:  See list above.   PAST SURGICAL HISTORY:  She has had a total abdominal hysterectomy in 2004,  laparoscopic cholecystectomy in 2000, and laparoscopic evaluation for  endometriosis prior to that.   PERSONAL HISTORY:  She is married and describes using alcohol socially.  She  has smoked 1/2 pack per day for the last 8 years.  She has had one  miscarriage in the past prior to her hysterectomy.  She denies use of  illicit drugs and there are no other children.   FAMILY HISTORY:  There is a history of colon cancer associated with her   paternal grandfather and paternal uncle and prostate cancer in her paternal  grandfather as well.  Her father died of an MI.  Her paternal aunt has had  ovarian cancer.  There is no known family history of breast or uterine  cancer.   PHYSICAL EXAMINATION:  GENERAL:  An anxious young white female somewhat  sedated from the use of Ativan but requesting more medications.  VITAL SIGNS:  With stable vital signs.  NECK:  Supple.  CHEST:  Clear to auscultation.  HEART:  S1 & S2 regular.  ABDOMEN:  Soft with normal bowel sounds with some left lower quadrant  tenderness on palpation with no evidence of hemoptysis.  RECTAL:  Deferred.   Laboratory evaluation including liver function tests, renal function, CBC  and UA are all within normal limits.  White count was 7.6 with hemoglobin of  14.2.  Sodium was slightly low at 133 and the rest of her electrolytes were  within normal limits.   PLAN:  As above.  Further recommendations made thereafter.                                               Anselmo Rod, M.D.    JNM/MEDQ  D:  03/14/2004  T:  03/14/2004  Job:  [id number]   cc:   Olene Craven, M.D.  [address]  [address]  [place]  [place] [address]  Fax: [phone]

## 2011-02-05 NOTE — Discharge Summary (Signed)
[name], LABELL                            ACCOUNT NO.:  [id number]   MEDICAL RECORD NO.:  [id number]                   PATIENT TYPE:  INP   LOCATION:  2309                                 FACILITY:  [facility]   PHYSICIAN:  Olene Craven, M.D.            DATE OF BIRTH:  [birth year]   DATE OF ADMISSION:  04/03/2004  DATE OF DISCHARGE:  04/07/2004                                 DISCHARGE SUMMARY   DISCHARGE DIAGNOSES:  1. Suicide attempt, acetaminophen.  2. History of mood disorder/anxiety disorder/bipolar disorder.  3. Migraine.   PROCEDURES:  The patient had a central line placed.   CONSULTATIONS:  Psychiatry, Dr. Dub Mikes.   DISCHARGE MEDICATIONS:  She will continue her previous medications from her  last discharge from March 20, 2004, which is:  1. Effexor XR 37.5 mg p.o. daily.  2. Estradiol 1 mg p.o. daily.  3. Robaxin 100 mg p.o. q.6h. p.r.n.  4. Thorazine 25 mg 1-2 tablets p.o. q.6h. p.r.n.  5. Zanaflex 4 mg p.o. q.8h. p.r.n.  6. Klonopin 0.5 mg b.i.d.  7. Atenolol 100 mg p.o. daily.  8. Lexapro 10 mg p.o. daily.   HOSPITAL COURSE:  The patient was admitted on April 03, 2004, after attempted  suicide.  She was having social issues with her husband being in [place] in the  [employer].  She ingested a combination of Tylenol, Robaxin and Zanaflex.  Tylenol level on admission was 117.6.  She did receive a course of the  Mucomyst therapy with acetaminophen level being reduced to less than 10.  She did see an increase in her LFT's.  However, this leveled off and was on  the decline at the time of discharge.  Alk-phos being 142, SGOT 94, SGPT 42,  INR 0.8.  She is being discharged to the [facility]  to follow up for psychiatric treatment.  The patient also suffered from  migraine during the course of her hospitalization.  She has a long standing  history of migraines with allergies to multiple medications.  She has been  seen by the headache center as an  outpatient and put on regimen including  what her discharge summary includes.  The patient has a long standing  history of multiple medical admissions requiring large amounts of narcotics  for her migraine control.  This remains an issue of concern considering  narcotic use and abuse potential.  She is to follow up with the headache  center upon discharge from [facility].  She, at  this time, is stable for discharge at the [facility] for psychiatric care.  I would recommend at this time an LFT follow  up at 72 hours.  Of note, the patient did have a urine drug screen that was  positive for benzodiazepine, barbiturates and cocaine.  However, she denies  cocaine use at this time.   RECOMMENDATIONS:  Follow up LFT's in 72 hours.  Continue to follow up with  headache center upon discharge to [facility].  She is stable  on discharge.                                                Olene Craven, M.D.    DEH/MEDQ  D:  04/07/2004  T:  04/07/2004  Job:  [id number]
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
## 2011-02-05 NOTE — Assessment & Plan Note (Signed)
FOLLOWUP NOTE   DATE OF SERVICE:  September 23, 2006   [name] returns to clinic today for followup evaluation.  She  reports that she is getting good relief from her Morphine Sulfate used  three to four tablets per day.  She does use her Soma twice a day but  gets toward the end of the day and has trouble sleeping.  She would like  to consider an adjustment of that medication or possibly a different  medication for improvement in her sleep and tightness in her back.  She  has tried Flexeril and Robaxin in the past without much benefit.   In terms of her work:  She is looking for a job at the present time.  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  She plans to apply to  nursing school as of March of 2008.   MEDICATIONS:  1. Estrace 1 mg.  2. Hydrochlorothiazide 25 mg.  3. Morphine Sulfate mediate release 15 mg one tablet q.i.d., p.r.n.      (three to four per day).  4. Soma 350 mg one tablet b.i.d., p.r.n.   REVIEW OF SYSTEMS:  Positive for weight loss.   PHYSICAL EXAMINATION:  GENERAL:  Well-appearing adult female in mild to  no acute discomfort.  VITAL SIGNS:  Blood pressure 120/71 with pulse of 80, respiratory rate  16 and 02 saturation 97% on room air.  NEURO:  She has 5- over 5 strength throughout the bilateral upper and  lower extremities.  Sensation was intact to light touch throughout.  She  ambulates with no assisted device.   IMPRESSION:  1. Mid thoracic sprain related to T7-T8 nucleolus pulposus without      neurological compromise.  2. Degenerative disc disease of the lumbar spine, L3-L5.  3. Status post rotator cuff repair, right upper extremity with      subsequent re-injury.   PLAN:  1. Off today.  2. No refill on her morphine sulfate __________ or Soma is necessary.      She is allowed to increase her Tresa Garter that she has at home to 350 mg      three times a day from twice a  day.  She will be calling in for a      refill on the Soma in approximately three weeks which should      coincide with a refill on the Morphine Sulfate.  She has a bottle      of the Soma available at the pharmacy but that is on a b.i.d.      dosing and she is now allowed to take it three times per day.  We      will plan on filling both of those prescriptions probably the third      week in January 2008.  We will plan on seeing the patient in      followup in approximately two to three months' time with refill as      part of that appointment if necessary.           ______________________________  Ellwood Dense, M.D.     DC/MedQ  D:  09/23/2006 14:32:14  T:  09/23/2006 15:19:18  Job #:  [id number]

## 2011-02-05 NOTE — Assessment & Plan Note (Signed)
MEDICAL RECORD NUMBER:  [id number].   [name] returns to clinic today for followup evaluation. I first and  last saw her October 14, 2004 for evaluation and treatment of mid thoracic  pain posteriorly. The patient reported at that time that she was getting a  fair amount of relief from oxycodone medication which she had obtained  through an emergency room visit. With that information, we had started her  back on her oxycodone and asked her to use 5 mg 1 tablet t.i.d. p.r.n. for  her posterior thoracic pain. She had a MRI scan of her thoracic spine  August 05, 2004 which showed a small right paracentral herniated nucleus  pulposus at T7-8 abutting the thoracic cord with abnormal cord signal. There  was also mild degenerative disk disease in the lower thoracic spine. She was  being treated by Dr. Otelia Sergeant at that time, and he recommended conservative  management with no over head lifting of greater than 10 to 15 pounds.   The patient is reporting that she has not gained any relief with the  oxycodone and actually threw the medication away recently when she got  frustrated. She apparently has been back to see the emergency room physician  at least once, and I received a phone call from the emergency room physician  approximately a week ago. At that time, she was complaining of low back pain  in addition to headache pain. She was apparently prescribed mild analgesics  and sent home and told to rest for a while. That did not satisfy her, and  she went into the [facility] room on February 15. She  reports that x-rays showed she had degenerative disk disease at the L4-5  level, and this seems to have satisfied her. She was also treated with  Reglan, Ativan, and also Dilaudid for her headaches.   The patient reports that she has received treatment with Dr. Neale Burly, her  migraine headache specialist, in the past. Apparently, they were using  visualization along with Midrin and  occasional Demerol. She is unable to get  in with Dr. Neale Burly as she is having trouble with insurance coverage at the  present time. Additionally, she has not been able to get in with therapy  because of the same problem. Apparently, she has been working for [employer] since December, but she has had some work absences, and they  apparently require an extensive period of time consecutively working before  an employee can be covered under their Programmer, applications. She has [employer]  through her husband but is trying to get over to [employer]  through the [employer].   The patient reports that she has tried Vicodin and also methadone in the  past, and they either gave her no relief or made her sick. Also, she has  tried Ultram, and that reportedly made her sick. She is anxious about trying  any patches, and for that reason, we have not used fentanyl on her.   I have explained to her that we need to have her managed through this office  for her pain and try to avoid emergency room visits. Hopefully, she can get  back in with Dr. Neale Burly, and we can decrease the incidence of emergency  room visits for treatment of migraine headaches.   The patient also has questions regarding her employer and what restrictions  she needs to have at work at the present time.   MEDICATIONS:  1.  Atenolol  100 mg q.d.  2.  Estrace 1 mg q.d.  3.  Oxycodone 5 mg t.i.d. p.r.n. (not using at present).   PHYSICAL EXAMINATION:  Well-appearing, fit, adult female in mild to no acute  distress. Blood pressure 114/72 with pulse of 89, respiratory rate 16, and  O2 saturation 98% on room air. Bilateral upper extremity exam showed 4 to  4+/5 strength throughout. Bulk and tone were normal. Reflexes were 2+ and  symmetrical. Bilateral lower extremity exam showed strength at least 4+/5  with decreased sensation of the right lateral and anterior thigh. The  patient ambulates without any assistive  device.   IMPRESSION:  1.  Mild thoracic pain related to T7-8 herniated nucleus pulposus without      neurological changes.  2.  Reported degenerative disk disease of the lumbar spine at L4-5.   In the office today, we did repeat the instructions about all pain medicines  coming through this office. If she continues to seek Dilaudid or Demerol  through the emergency room, then we will probably need to discharge her from  this practice. I am not prepared to prescribe Demerol or Dilaudid for her on  a regular basis.   We have been left with minimal choices regarding her pain medicines. She  cannot take Vicodin or methadone and is not interested in fentanyl. She has  not gained any relief with the oxycodone according to her report.   We have decided to try MS Contin at 15 mg 1 p.o. b.i.d. I have given a  prescription for 60 in the office today.   We will have also discussed her work issues. At the present time, we  discussed general avoidance of lifting greater than 20 pounds. That should  be adequate for her at the present time. Hopefully, she will not be  discharged from her employment. We will plan on seeing the patient in  followup in approximately one month's time.      DC/MedQ  D:  11/09/2004 10:54:13  T:  11/09/2004 11:27:07  Job #:  [id number]

## 2011-02-05 NOTE — Discharge Summary (Signed)
[place]  Patient:    [name], [name]                 MRN: [id number] Adm. Date:  [id number] Disc. Date: [id number] Attending:  Alfonso Ramus Dictator:   Talmage Coin, M.D. CC:         Outpatient Clinic   Discharge Summary  DISCHARGE DIAGNOSES: 1. Dehydration. 2. Nausea and vomiting. 3. Diarrhea. 4. Viral gastroenteritis. 5. Irritable bowel syndrome. 6. Endometriosis. 7. Migraine. 8. Status post resection of the gallbladder August 28, 2000 status post    appendectomy.  DISCHARGE MEDICATIONS: 1. Benadryl 50 mg one tab p.o. t.i.d. x 2 days. 2. Darvocet-N 100 one tab p.o. q.4h. p.r.n. pain.  FOLLOW-UP:  The patient has a follow-up appointment scheduled at the outpatient clinic on Monday, September 05, 2000 at 2 p.m.  At this time, we will follow up on her gastroenteritis as well as her dystonic reaction.  PROCEDURE:  Abdominal x-ray showed no obstruction or masses.  CONSULTANTS:  None.  HISTORY OF PRESENT ILLNESS:  [name] is a 45 year old white female with a history of irritable bowel syndrome, endometriosis, and migraine headaches who presents to the emergency room with complaints of dizziness, nausea, vomiting, diarrhea, and headache.  She stated that she had been unable to keep down solid foods for eight days and has tolerated very little fluid intake for three days.  She reported vomiting as well as having diarrhea shortly after taking anything by mouth.  She also complained of abdominal pain in the epigastric and suprapubic region.  She denied blood in emesis or diarrhea. Additionally, on the day of admission, she reported that a migraine headache had begun, this was mostly left sided and also associated with tinnitus and a visual halo.  She stated that the headache at time of her presentation was 8/10 although it had previously been relieved somewhat by morphine.  She said she felt dizzy on standing but has not had  any syncope.  While in the ER, she had had additional bouts of diarrhea but no further emesis.  PAST MEDICAL HISTORY:  Irritable bowel syndrome, migraine headaches, endometriosis, and panic attacks.  PAST SURGICAL HISTORY:  Appendectomy, cholecystectomy, and an exploratory laparoscopy for endometriosis.  ALLERGIES:  ASPIRIN and NSAIDs, SULFA, CODEINE, and IMITREX.  SOCIAL HISTORY:  The patient lives in [place] with her mother, is single, and self-employed.  Smokes one pack of cigarettes per day x 14 years. Occasional alcohol.  No drugs.  PHYSICAL EXAMINATION:  VITAL SIGNS:  The patient had a temperature of 100.6 and a blood pressure 122/73, orthostatic blood pressure showed no significant change from from lying to standing, pulse 108, and 75% O2 saturations on room air.  GENERAL:  The patient was alert and oriented but lying in the bed with her head covered by a towel.  HEENT:  Pupils were equal, round, and reactive to light.  Extraocular muscles were intact.  Her oropharynx was clear.  CARDIOVASCULAR:  Her heart rate was regular without any murmurs, rubs, or gallops.  LUNGS:  Clear to auscultation bilaterally.  There was no CVA tenderness.  ABDOMEN:  Soft, nontender, with normoactive bowel sounds, but was diffusely tender, especially in the epigastric and suprapubic areas.  There was no hepatosplenomegaly.  EXTREMITIES:  No edema, pulses were 2+.  NEUROLOGIC:  The patient was alert and oriented x 3.  Cranial nerves II-XII were grossly intact.  Strength was 5/5.  Deep tendon reflexes were 2+. Sensation  was grossly intact.  LABORATORIES AT TIME OF ADMISSION:  BMP: Sodium 139, potassium 3.6, chloride 107, bicarb 26, BUN 8, creatinine 0.8, glucose 87.  CBC showed white count of 10.7 with an ANC of 6.6, hemoglobin 14.6, hematocrit 40.7, and platelets 571. Liver function tests were all within normal limits.  Urinalysis was negative. Urine pregnancy test was negative.   Abdominal film was negative as well.  HOSPITAL COURSE:  The patient was admitted for IV fluid rehydration in an attempt to control her vomiting and diarrhea as this was thought to be viral gastroenteritis versus a small-bowel obstruction versus an irritable bowel syndrome exacerbation.  She was given IV fluids and clear liquid p.o. as tolerated.  She was given Phenergan for the nausea and morphine IV for migraine headache.  Throughout the duration of the admission, the patients pain was controlled fairly well with IV morphine.  Additionally, nausea was controlled with Phenergan and then Compazine was begun in an attempt to relieve the headache pain and the nausea.  Just shortly before the patient was planning to be discharged the patient was found to be having a dystonic reaction about one hour after she had Compazine in which she was turning her head, clenching her teeth, and contracting the whole left side of her body. The patients O2 saturation was noted at this time and determined to be greater than 90% on room air.  IV Benadryl 50 mg was given to the patient with relief of symptoms.  The patient was then watched for several more hours and given an additional dose of IV Benadryl 25 mg in which time she was determined to not have any further dystonia although she did have some muscle tenderness in the areas that had been dystonic.  At the time of discharge, the patients vital signs were temperature 99.0, pulse 66, respirations 20, and blood pressure was 65/81.  Pupils were equal, round, and reactive to light.  Lungs were clear to auscultation bilaterally. Heart rate was regular.  Abdomen was soft and nontender with good bowel sounds but was still diffusely tender.  Extremities showed no edema.  Discharge laboratories were sodium 139, potassium 3.3, chloride 109, bicarb 22, BUN 3, creatinine 0.7, and glucose 105.  White blood cell count was 7.5.   It was determined that the patients  symptoms of viral gastroenteritis had improved although she still was not taking solid food p.o.  She was taking sufficient fluids p.o. and her headache symptoms had resolved somewhat.  The patient was discharged with Darvocet for pain and Benadryl to prevent further dystonia.  Due to this dystonic reaction the patient was not given any further Compazine or Phenergan. DD:  08/26/00 TD:  08/26/00 Job: [id number]

## 2011-02-05 NOTE — Group Therapy Note (Signed)
REASON FOR EVALUATION:  Evaluate and treat mid thoracic pain.   HISTORY OF PRESENT ILLNESS:  [name] is a 45 year old adult female  referred to this office by Dr. Otelia Sergeant for evaluation and treatment of mid  thoracic pain posteriorly. The patient reports that the pain has been  present since approximately September 2005. She denies any particular  injury, falls or accident at that time. She had been working out at [employer]  regularly and had not noticed any particular problems. She has not noticed  any injuries while she was doing exercises on a regular basis. The patient  reports that the pain seems to have developed spontaneously. On July 09, 2004, the patient saw Dr. Otelia Sergeant for the mid back along with rib pain and  also right knee pain. He diagnosed soft tissue pain of the right  parascapular region secondary to muscle strain/sprain. He recommended a rib  belt along with Flexeril. On July 29, 2004 the patient saw Dr. Otelia Sergeant in  followup. She had neck and back pain and reported problems using her arm  over her head on the right. The pain was along the medial scapular border.  Dr. Otelia Sergeant requested a MRI scan of her thoracic spine. The patient also  reports that bone spurs were identified on right shoulder films. On August 05, 2004, a MRI scan of her thoracic spine without contrast showed small  right paracentral herniated nucleus pulposus at T7-8 abutting the thoracic  cord without abnormal cord signal. There was mild degenerative disk disease  in the lower thoracic spine. On August 07, 2004 the patient saw Dr. Otelia Sergeant  in followup. The MRI scan was reviewed. No surgery was recommended for the  disk herniation. She was referred to pain management and refilled on her  Vicodin. She was told to avoid overhead lifting of greater than 10 to 15  pounds. Presently, the patient reports that her pain is in the right lower  back, in the thoracic region around the area of the lower  rhomboids. She  reports that there is deep pressure pain in that area. When she sits for any  prolonged period of time, she has a sharp radiating pain into her right  scapular area and radiation into her right shoulder. She reports that if she  stands, lifts, or walks for a prolonged period of time, that same pain  radiates into her right hip and down her right leg into her knee. She  reports that Flexeril has helped some in the past but she gained no relief  with the Vicodin. She also reports having tried Amitriptyline but that made  her too sedate. She also gained no relief with Klonopin and no substantial  relief with Robaxin. The patient had tried physical therapy for 2 visits but  that was stopped by her insurance carrier, as the pre-authorization steps  were not taken. She reports that she gained no relief with the rib belt. The  patient reports that she had been on Dilaudid in the past. She went to the  emergency room recently for migraine headaches and was sent home with  Oxycodone for approximately 2 or 3 days. She reports that that gave her a  fair amount of relief, mostly in her back, but did not help her headache  much. She feels that if she had pain control for a short period of time,  that she could resume some of her prior workouts and also if she had  therapy, she could  improve. She has not been able to attend therapy because  of insurance issues. 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 She had been doing aerobics along with the use of  free weights and a treadmill back in September 2005. She has not been doing  much of that recently and would like to get back into that.   MEDICATIONS:  Atenolol 100 mg q.d., Esterase 1 mg q.d.   PAST MEDICAL HISTORY:  1.  Prior appendectomy.  2.  Prior cholecystectomy.  3.  Prior hysterectomy.  4.  Status post left wrist ganglion cyst resection.  5.  History of headaches.   FAMILY HISTORY:   Positive for cancer of the stomach, breast, lung and bone.  She also has positive family history for hypertension and heart disease.   ALLERGIES:  ASPIRIN, SULFA, IBUPROFEN, NAPROXEN, INTRAVENOUS IMITREX,  ULTRAM, STEROIDS, TRYPTOPHAN, COMPAZINE.   SOCIAL HISTORY:  The patient smokes 1/2 pack of cigarettes per day and  admits to occasional alcohol intake. 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 She is separated with no children at this time.   PHYSICAL EXAMINATION:  GENERAL:  A well appearing, fit adult female.  VITAL SIGNS:  Blood pressure 143/94 with pulse of 70, respiratory rate 16  and O2 saturation 98% on room air.  EXTREMITIES:  She complains of pain in her right scapular region, both  medially and inferiorly with range of motion of her bilateral upper  extremities. She has rather full range of motion but complains of pain in  those areas noted. On palpation, those areas were also painful and she has  also pain up into her left axilla. Strength was generally 4 to 4+ over 5  throughout the bilateral upper extremities. Reflexes were 2+ and symmetrical  and sensation was intact to light touch throughout. Lower extremity  examination showed normal bulk and tone throughout with reflexes, 2+ and  symmetrical and sensation intact to light touch. Strength was 5 over 5  throughout the bilateral lower extremities.   IMPRESSION:  Mid thoracic back pain probably related to T7-8 herniated  nucleus pulposus.   PLAN:  At the present time, conservative treatment has been recommended for  her herniated nucleus pulposus at T7-8. We have given her a refill on her  Oxycodone 5 mg 1 tablet p.o. t.i.d. p.r.n. I asked her to discontinue her  Vicodin medication, as she gained no relief with that. She is comfortable  with that plan. We have also planned for her to start outpatient physical  therapy, probably early February, once she is covered by [facility] in place of her  present insurance. Hopefully, we can treat her with  narcotics for a very short period of time and then get her active with the  therapy and that hopefully, will improve her pain overall. We will plan on  seeing her in followup in this office in approximately 1 months time to see  how she is doing. She is comfortable with this plan at the present time and  reports that she will be complying with all restrictions of our narcotic  agreement that she signed in the office today.      DC/MedQ  D:  10/14/2004 16:49:30  T:  10/14/2004 21:47:30  Job #:  [id number]   cc:   Kerrin Champagne, M.D.  [address]  [place]  [place] [address]  Fax: [phone]

## 2011-02-05 NOTE — Discharge Summary (Signed)
[name], [name]          ACCOUNT NO.:  [id number]   MEDICAL RECORD NO.:  [id number]          PATIENT TYPE:  INP   LOCATION:  5523                         FACILITY:  [facility]   PHYSICIAN:  Pearlean Brownie, M.D.DATE OF BIRTH:  [birth year]   DATE OF ADMISSION:  10/22/2005  DATE OF DISCHARGE:  10/24/2005                                 DISCHARGE SUMMARY   DISCHARGE DIAGNOSES:  1.  Tylenol overdose.  2.  Suicide attempt.  3.  Anorexia nervosa.  4.  Anxiety.  5.  Depression.  6.  Migraines.  7.  Borderline personality disorder.  8.  Pseudoseizure.  9.  Posttraumatic stress disorder.   DISCHARGE MEDICATIONS:  To be determined by Behavioral Health. We will not  resume any medications currently.   CONSULTATIONS:  Poison Control, wound care management as well as Behavioral  Health.   BRIEF HISTORY ADMISSION:  [name] is a 45 year old female with  multiple psychiatric illness who was brought to the emergency department  after a reported overdose of greater than 90 Tylenol gelcaps as well as five  Fentanyl patches and apparently some MSIR as well. The patient states  multiple social stressors leading up to her actions. She is unsure if she  wanted to end her life. One of the patient's friends called her immediately  after she took the medication and the patient revealed what she had done, so  the friend called EMS and she was brought promptly to the emergency  department. In the ER she was given Charcoal and Mucomyst and the initial  Tylenol range was 260. She was admitted for this. Please see the dictated  history and physical for full details of the admission history and physical.   HOSPITAL COURSE:  Problem #1:  Tylenol overdose. The patient was started  Mucomyst promptly, i.e., within a couple of hours after ingestion. Her  initial Tylenol level was 253.3, it dropped down to 168.6 with Mucomyst  treatment and at discharge was less than 10.  Her liver function  tests rose  and plateaued the day of her admission. SGOT went from 190 to 165,  to 83 to  43; SGPT from 55 to 127 to 126 to 86.  The Poison Control Center was  contacted to assist with [place] in the care of this patient. Mucomyst was  continued for 48 hours, but there were alterations in her laboratory studies  that I have already discussed.  Her INR was never elevated above 1.3, and  was 1.2 the day prior to discharge. After consulting with the [facility] on the day of discharge, her lab results were discussed with them and  they stated that her vital signs were stable, her Tylenol level was less  than 10, and her SGOT and SGPT levels that they were, i.e., they had  plateaued and then begun to drop, that she would be stable for discharge.  She had been seen by [facility] and was deemed appropriate for  inpatient care.  So we contacted them and informed them of the advice given  by the [facility] and they suggested that we consult  care  management to have the patient evaluated for discharge.   Problem #2:  Suicide attempt. The patient denies being actively suicidal.  However, with large amounts of medications that she took and the resultant  alterations in her labs, which worries, it seems like this certainly is a  likely possibility and she has also had a history of this in the past.  Psychiatry was consulted and they recommend inpatient Behavioral Health.   Problem #3:  Anxiety.  She was continued on her Klonopin during her  hospitalization.   Problem #4:  Depression. She did not appear to be on an regular medications  for this and therefore nothing was continued. However, she may require some  kind of medication after discharge to [facility].   Problem #5:  Migraine headache. She did not complain of migraine headache  during her hospitalization. Her atenolol had been stopped at admission. She  takes the atenolol for prophylaxis and this could be  restarted as an  outpatient.   Problem #6:  Borderline personality disorder. This will be addressed with  Behavioral Health.   Problem #7:  Pseudoseizures. There was no evidence of pseudoseizures during  her admission.   Problem #8:  PTSD. He is currently not medically treated for that and this  appeared to be stable during her hospitalization.   The patient was discharged to the care of the [facility] in  medically stable condition.      Hailey Beath, MD    ______________________________  Pearlean Brownie, M.D.    JT/MEDQ  D:  10/24/2005  T:  10/24/2005  Job:  [id number]   cc:   Geoffery Lyons, M.D.   Dr. Merla Riches  [facility]

## 2011-02-05 NOTE — Op Note (Signed)
[facility]  Patient:    [name], [name]                 MRN: [id number] Proc. Date: 07/19/00 Adm. Date:  [id number] Attending:  Wandalee Ferdinand                           Operative Report  PREOPERATIVE DIAGNOSES:       1. Endometriosis.                               2. Pelvic pain.  POSTOPERATIVE DIAGNOSES:      1. Endometriosis.                               2. Pelvic pain.                               3. Pigmented lesions on the posterior                                  aspect of the right ovary and right                                  uterosacral ligament.  PROCEDURE:                    1. Diagnostic/operative laparoscopy with                                  fulguration of lesions.                               2. Chromopertubation of the ova ducts.  SURGEON:                      Rudy Jew. Ashley Royalty, M.D.  ANESTHESIA:                   General.  ESTIMATED BLOOD LOSS:         Less than 25 cc.  COMPLICATIONS:                None.  PACKS/DRAINS:                 None.  DESCRIPTION OF PROCEDURE:     The patient was taken to the operating room and placed in the dorsal supine position.  An adequate general endotracheal anesthesia was administered.  She was placed in the lithotomy position and prepped and draped in the usual manner for abdominal and vaginal surgery.  A 1.5 cm infraumbilical incision was made with through the umbilicus in the transverse plane.  The operator dissected into the abdominal cavity using sharp and blunt dissection.  An open laparoscopic technique was used where the open laparoscopic trocar was placed directly into the abdominal cavity and stabilized with stay sutures of #0 Vicryl in the fascia.  The laparoscope was placed.  There was no evidence of any trauma.  A pneumoperitoneum was created with CO2 and maintained throughout  the procedure.  A left-sided suprapubic 5.0 mm trocar was placed using direct  visualization and transillumination techniques.  The pelvis was thoroughly surveyed.  The uterus was normal size, shape, and contour, without evidence of any endometriosis or adhesions.  There were no fibroids.  The fallopian tubes were bilaterally normal size, shape, length, with luxuriant fimbria.  The left ovary was normal size and shape, without evidence of any endometriosis or cysts.  The right ovary was normal size and shape, without evidence of any cysts.  There was a pigmented lesion on the posterior aspect of the right ovary, consistent with, but not diagnostic of endometriosis.  In addition, the right uterosacral ligament contained a pigmented lesion consistent with, but not diagnostic of endometriosis.  The ureters were identified bilaterally.  The peritoneal surfaces were smooth and glistening without evidence of any endometriosis. Appropriate photos were obtained.  The bipolar cautery was used to cauterize the pigmented lesions mentioned above.  Appropriate photos were obtained.  Chromopertubation of the ova ducts was performed with Indigo Carmine.  The peritoneal spill was noted bilaterally.  Appropriate photos were obtained. The patient was felt to then have benefited maxillary from the surgical procedure.  The abdominal instruments were removed, and the pneumoperitoneum evacuated.  The fascial defects were closed with #0 Vicryl in an interrupted fashion.  The skin was closed with #3-0 chromic in a subcuticular fashion. Approximately 10 cc of 0.25% bupivacaine was instilled into the surgical incisions to aid in postoperative analgesia.  The vaginal instruments were removed.  Hemostasis was noted.  The procedure was terminated.  The patient was taken to the recovery room in excellent condition.DD:  07/19/00 TD:  07/19/00 Job: [id number]

## 2011-02-05 NOTE — Op Note (Signed)
[name], [name]                            ACCOUNT NO.:  [id number]   MEDICAL RECORD NO.:  [id number]                   PATIENT TYPE:  AMB   LOCATION:  DSC                                  FACILITY:  [facility]   PHYSICIAN:  Almedia Balls. Ranell Patrick, M.D.              DATE OF BIRTH:  [birth year]   DATE OF PROCEDURE:  01/24/2004  DATE OF DISCHARGE:                                 OPERATIVE REPORT   PREOPERATIVE DIAGNOSIS:  Left dorsal wrist ganglion.   POSTOPERATIVE DIAGNOSIS:  Left dorsal wrist ganglion.   PROCEDURE PERFORMED:  Left dorsal wrist ganglion excision and posterior  interosseous nerve neurectomy.   ATTENDING SURGEON:  Almedia Balls. Ranell Patrick, M.D.   General anesthesia was used.   ESTIMATED BLOOD LOSS:  Minimal.   TOURNIQUET TIME:  17 minutes.   Instrument count was correct.  There were no complications.  Perioperative  antibiotics given.   INDICATIONS:  The patient is a 45 year old female who presents complaining  of a painful dorsal wrist mass.  The patient has negative x-ray.  She has an  examination consistent with a dorsal wrist ganglion, which seems to be  irritating her terminal branch posterior interosseous nerve on the dorsal  wrist capsule.  The patient has pain with dorsiflexion and pain with grip,  and it is interfering with activities of daily living.  Due to these  concerns, the patient has elected to proceed with surgical management.   DESCRIPTION OF OPERATION:  After an adequate level of anesthesia was  achieved, the patient positioned on the operating room table, the left arm  was sterilely prepped and draped.  A nonsterile tourniquet was placed on the  left proximal arm.  The left arm was then exsanguinated and the tourniquet  elevated to 300 mmHg.  A transverse skin incision was created directly  overlying the subcutaneous mass.  Dissection was carried under loupe  magnification sharply down through the subcutaneous tissues.  The mass was  dissected free of  surrounding tissues and down to the dorsal wrist capsule  and excised with its stalk off the capsule and sent for pathology.  Exposure  was then made of the terminal branch of the posterior interosseous nerve at  the wrist capsule.  One centimeter of this nerve was resected, allowing the  free end of the nerve to retract back into the proximal forearm.  Thorough  irrigation was performed, followed by 3-0 Vicryl subcutaneous closure with  Steri-Strips, a sterile dressing, and a short-arm splint.  The patient  tolerated surgery well and was taken to the recovery room in stable  condition.  Two specimens, both the terminal end of the nerve and the cyst,  were sent.  Almedia Balls. Ranell Patrick, M.D.    SRN/MEDQ  D:  01/24/2004  T:  01/25/2004  Job:  [id number]

## 2011-02-05 NOTE — Discharge Summary (Signed)
[name], [name]          ACCOUNT NO.:  [id number]   MEDICAL RECORD NO.:  [id number]          PATIENT TYPE:  IPS   LOCATION:  0306                          [facility]  [facility]   PHYSICIAN:  Geoffery Lyons, M.D.      DATE OF BIRTH:  Jul 05, 1966   DATE OF ADMISSION:  10/24/2005  DATE OF DISCHARGE:  10/29/2005                                 DISCHARGE SUMMARY   CHIEF COMPLAINT AND PRESENT ILLNESS:  This was the second admission to [facility] for this 45 year old divorced white female  voluntarily admitted.  Transferred from [facility] after overdosing  on 75 100 mg Tylenol gel caps, 8 morphine tablets and 15 mcg patch of  fentanyl, using 6-7 at home.  Intention of hoping to go quietly in her  sleep.  Endorsed that she was tired of all the peaks and valleys of her  depression.  She had no interest in things, had dropped out of school due to  her depression.  History of substance abuse.  Has been sober for 15 months  from her periods of binge-drinking.  States she has tried all medicines.  Denied any specific stressors.  Had been sleeping satisfactorily.  History  of eating disorder.  Endorsing nausea.   PAST PSYCHIATRIC HISTORY:  Second time at [facility].  Was admitted  two years prior to this admission.  Seeing Delano Metz for  psychotherapy.  Prior history of overdosing six times on Tylenol.   ALCOHOL/DRUG HISTORY:  As already stated, prior history of substance abuse.  Denied any active use.   MEDICAL HISTORY:  [facility].  Endorsed migraines and anorexia.  Seeing Dr.  Ellamae Sia.   MEDICATIONS:  Atenolol 100 mg per day, hydrochlorothiazide 25 mg per day,  was on Duragesic and morphine for breakthrough pain.   PHYSICAL EXAMINATION:  Performed and failed to show any acute findings.   LABORATORY DATA:  BUN 2.  Liver function tests with SGOT 185, SGPT 120.  Tylenol level  was 160.6.   MENTAL STATUS EXAM:  Female, in bed, awake.  Good eye contact.  Speech was  normal in rate, tempo and production.  Endorsed feeling nauseated.  Affect  was constricted.  Thought processes were logical, coherent and relevant.  No  evidence of delusions.  No active suicidal or homicidal ideation.  Cognition  was well-preserved.   ADMISSION DIAGNOSES:  AXIS I:  Post-traumatic stress disorder.  Major  depressive disorder.  Bipolar disorder, type 2, depressed.  AXIS II:  Personality disorder not otherwise specified.  AXIS III:  Status post Tylenol overdose, hypertension, chronic back pain.  AXIS IV:  Moderate.  AXIS V:  GAF upon admission 25; highest GAF in the last year 70.   HOSPITAL COURSE:  She was admitted.  She was started in individual and group  psychotherapy.  She was maintained on the fentanyl patch 50 mcg every 24  hours, MSIR 15 mg four times a day as needed, Robaxin 500 mg four times a  day.  She was started on Lamictal 25 mg  per day and she was given atenolol  100 mg per day.  She endorsed this was one of a cycle of suicidal attempts.  Endorsed that she had done this every few years, same pattern.  Endorsed  relationship issues, past experiences, memories, flashbacks, still dealing  with the mood fluctuation.  Endorsed the suicidal ruminations but cannot  identify one trigger.  Endorsed being overwhelmed.  Hopeless, helpless.  Tired of the mood swings.  She continued to endorse a sense of hopelessness  and helplessness.  We started on Lamictal 25 mg per day.  On the 7th, she  had an acute migraine headache.  She was given Dilaudid 2 mg IM, which took  care of it.  Had difficulty with sleep.  Still feeling that her moods were  unpredictable, trying to identify possible triggers.  She was given Lunesta  and that night she had an allergic reaction to it.  Became dysphoric,  unresponsive, requiring 9-1-1 to be called.  She was returned back to the  unit.  On October 29, 2005, she endorsed she was better.  She was endorsing  no active suicidal or homicidal ideation.  She was committed to her health.  She was encouraged by the possibility that the medication was making a  difference.  She was also wanting to see a different counselor as she felt  that she was not getting anywhere with her __________ counselor.  As she was  improved and there were no active suicidal or homicidal ideation, we went  ahead and discharged to outpatient follow-up.   DISCHARGE DIAGNOSES:  AXIS I:  Bipolar disorder, depressed.  Post-traumatic  stress disorder.  AXIS II:  No diagnosis.  AXIS III:  Status post overdose, arterial hypertension.  AXIS IV:  Moderate.  AXIS V:  GAF upon discharge 50-55.   DISCHARGE MEDICATIONS:  1.  Protonix 40 mg per day.  2.  Tenormin 100 mg per day.  3.  Duragesic 50 mcg every 72 hours.  4.  Robaxin 500 mg four times a day.  5.  Lamictal 25 mg per day.  6.  Klonopin 1 mg every eight hours as needed.  7.  Morphine sulfate 15 mg for breakthrough pain.  8.  Seroquel 25 mg, 1-2 at night for sleep.   FOLLOW UP:  Dr. Lolly Mustache.  She was going to follow up with Delano Metz  and follow up with Vernia Buff.      Geoffery Lyons, M.D.  Electronically Signed     IL/MEDQ  D:  11/08/2005  T:  11/09/2005  Job:  [id number]

## 2011-02-05 NOTE — Op Note (Signed)
[facility]  Patient:    [name], [name]                 MRN: [id number] Proc. Date: 10/26/00 Adm. Date:  [id number] Attending:  Wandalee Ferdinand                           Operative Report  PREOPERATIVE DIAGNOSES:       1. Endometriosis.                               2. Pelvic pain.  POSTOPERATIVE DIAGNOSES:      Pelvic pain, no evidence of active                               endometriosis.  OPERATION:                    Diagnostic laparoscopy.  SURGEON:                      Rudy Jew. Ashley Royalty, M.D.  ASSISTANT:  ANESTHESIA:                   General anesthesia.  ESTIMATED BLOOD LOSS:         Less than 25 cc.  COMPLICATIONS:                None.  PACKS AND DRAINS:             None.  DESCRIPTION OF PROCEDURE:     The patient was taken to the operating room and placed in the dorsal supine position.  After adequate general anesthesia was administered. She was placed in the lithotomy position and prepped and draped in the usual manner for vaginal surgery.  A posterior weighted retractor was placed in the vagina and anterior lip of the cervix was grasped with a single-tooth tenaculum.  Jelco uterine manipulator was placed per cervix. Bladder was drained with a latex-free catheter which was left in situ during the procedure.  A 1.5 cm infraumbilical incision was made in the transverse plane.  Subcutaneous tissues were bluntly and sharply dissected down to the level of the peritoneum which was entered bluntly with the operators finger. Open laparoscopic trocar was then placed into the abdominal cavity and held in place with stay sutures of 0 Vicryl. The laparoscopic was placed and intra-abdominal location verified.  At this point pneumoperitoneum was created with CO2 and maintained throughout the procedure.  The pelvis was thoroughly surveyed.  A 5 mm trocar was placed in the left lower quadrant using transillumination and direct  visualization techniques to aid in visualization. The uterus was normal size, shape and contour without evidence of endometriosis or fibroids. The left and right ovaries were normal size, shape and contour without evidence of any cysts or endometriosis. The right and left fallopian tubes were normal size, shape, contour and length with ___________ fimbria bilaterally.  The anterior and posterior cul-de-sacs were clean.  The remainder of the peritoneal surfaces were smooth and glistening without evidence of any endometriosis or adhesions.  After this point, the patient was felt to have benefited maximally from the surgical procedure. Appropriate photos were obtained and the abdominal instruments were removed and pneumoperitoneum evacuated.  Fascial defects were closed with 0 Vicryl in interrupted fashion. The skin  was closed with 3-0 chromic in subcuticular fashion. Vaginal instruments were removed.  Hemostasis was noted and the procedure was terminated.  The patient tolerated the procedure extremely well and was returned to the recovery room in good condition. DD:  10/26/00 TD:  10/27/00 Job: [id number]

## 2011-02-05 NOTE — Discharge Summary (Signed)
[name], [name]          ACCOUNT NO.:  [id number]   MEDICAL RECORD NO.:  [id number]          PATIENT TYPE:  INP   LOCATION:  3009                         FACILITY:  [facility]   PHYSICIAN:  Altamese Cabal, M.D.  DATE OF BIRTH:  01-21-1966   DATE OF ADMISSION:  11/09/2005  DATE OF DISCHARGE:  11/11/2005                                 DISCHARGE SUMMARY   DISCHARGE DIAGNOSES:  1.  Nausea, vomiting, and dehydration.  2.  Hypokalemia.  3.  Migraine headache.  4.  History of drug abuse.  5.  History of psychiatric issues.   DISCHARGE MEDICATIONS:  1.  Protonix 40 mg p.o. daily.  2.  MSIR 15 mg p.o. q.8h. p.r.n. breakthrough pain.  3.  Fentanyl patch 500 mcg q.72h.  4.  Reglan 10 mg p.o. q.6h.  5.  Seroquel 25 to 50 mg p.o. q.h.s.  6.  Robaxin 500 mg p.o. q.i.d.  7.  Lamictal 25 mg p.o. daily.  8.  Klonopin 1 mg p.o. t.i.d. p.r.n.  9.  Estrace 1 mg p.o. daily.   LABORATORY DATA ON ADMISSION:  Sodium 135, potassium 2.9, chloride 95,  bicarb 27, BUN 20, creatinine 1.0, glucose 83.  T bilirubin 0.9, alkaline  phosphatase 103, AST 48, ALT 55, total protein 7.3, albumin 4.7, amylase 82,  lipase 34.  White count 6.9, hemoglobin 14.8, hematocrit 43.3, platelets  320.  UDS positive for opiates and benzodiazepines.   HISTORY OF PRESENT ILLNESS:  [name] is a 45 year old female who  presented to the ED with a 12-hour history of persistent nausea, vomiting,  and diarrhea.  The patient was unable to tolerate any fluid intake, so she  was admitted to the hospital for rehydration.   PHYSICAL EXAMINATION:  VITAL SIGNS:  She was afebrile, and other vital signs  were normal and stable.  ABDOMEN:  Nonsurgical.   HOSPITAL COURSE BY PROBLEM:  Problem 1.  Nausea, vomiting, and dehydration.  The patient was aggressively hydrated with IV fluids starting early in the  morning on the day of admission.  She was given Phenergan for nausea.  The  patient began to feel better much later  on the day of admission and was able  to take p.o. that night.  Her blood pressures remained low despite IV  fluids; however, the patient's baseline blood pressure is on the low side.  Her electrolyte abnormalities were also corrected.  No further vomiting was  witnessed by staff while she was hospitalized, but the patient said she had  a few small episodes of emesis while she was in the hospital.  We decided  not to do an EGD on the patient since her symptoms improved and she had had  a normal EGD back in 2005.  On the day of discharge, she was able to  tolerate clear liquids and was no longer nauseous or vomiting.   Problem 2.  Bacteria.  The patient was treated with Cipro for two days for  bacteria in her urine.  She did not have any symptoms of dysuria or  frequency.   Problem 3.  Hypokalemia.  The patient's initial potassium  was 2.9 and was  repleted IV on the morning of admission.  Later in the day, she was able to  tolerate p.o. potassium.  When she was discharged, her potassium was normal  at 3.6.   Problem 4.  Migraine headache.  The patient came in with a migraine  headache, and this persisted per the patient's report throughout her stay  until she was given 2 mg of IV Dilaudid, and then her headache was relieved  quite suddenly after that.   Problem 5.  History of drug abuse.  Given the patient's multiple suicide  attempts in the past, I only gave her four pills of her MS Contin, which is  a chronic medication for her, at discharge along with enough Klonopin to  last two days until the patient could follow up with her psychiatrist on  Monday.   Problem 6.  History of psychiatric issues.  The patient's Klonopin and  Seroquel were initially held along with Lamictal until she took p.o. which  was only 12 hours after admission.  I restarted those later that day, and  the patient remained stable.   DISCHARGE INSTRUCTIONS:  1.  She is to take all medications as prescribed.   2.  She is to follow up with [name], her counselor, on Monday.  She is      also to follow up with [name] next week.  3.  She is not to take her atenolol or hydrochlorothiazide until further      instructed to do so by a medical professional.  4.  She verbally contracted for her safety and denied any suicidal or      homicidal ideations at any time while she was in the hospital.      Altamese Cabal, M.D.     KS/MEDQ  D:  11/14/2005  T:  11/15/2005  Job:  [id number]

## 2011-02-05 NOTE — H&P (Signed)
[name], [name]                            ACCOUNT NO.:  [id number]   MEDICAL RECORD NO.:  [id number]                   PATIENT TYPE:  INP   LOCATION:  1843                                 FACILITY:  [facility]   PHYSICIAN:  Ralene Ok, M.D.                   DATE OF BIRTH:  September 13, 1966   DATE OF ADMISSION:  04/03/2004  DATE OF DISCHARGE:                                HISTORY & PHYSICAL   HISTORY OF PRESENT ILLNESS:  This 44 year old white female was brought to  the emergency room after she was noted to have taken an overdose of her  medications.   The patient apparently took her Robaxin, Keppra, and Tylenol.  The exact  time of ingestion is unclear, and on arrival, the patient is drowsy, is  arousable, but is not speaking as to why she took the overdose.  She had  only recently been discharged from the hospital when she was admitted with  nausea and vomiting.  Evaluation at that time with a CT scan of the abdomen  and pelvis was negative according to her discharge.  The patient had been  discharged on multiple medications that included Effexor, estradiol,  Robaxin, Thorazine, Keflex, Klonopin, atenolol, Lexapro, and Percocet.  Currently unclear how the patient obtained Keppra.  She, however, has been  seeing the neurologist at the headache clinic, and it is not clear if this  was started recently.  The patient's mother is at her bedside, but is unable  to give any significant history.  The patient apparently has been admitted  in the past with an overdose in 1982.  __________ in 2002.   ALLERGIES:  She gives an allergy list of multiple medications including -  1. SALICYLATE.  2. PHENERGAN.  3. COMPAZINE.   REVIEW OF SYSTEMS:  The patient does not give any review of systems.  She  did, however, did wake up and ask for a Percocet tablet.   PAST MEDICAL HISTORY:  Her previous main medical problems included -  1. Major depression.  2. Borderline personality disorder.  3. History  of migraine-type headaches, and a recent CT scan of the head was     reported as negative.  4. Status post total hysterectomy, with a past history of endometriosis.  5. She has also had a cholecystectomy.   PHYSICAL EXAMINATION:  VITAL SIGNS:  The patient is afebrile.  GENERAL:  She is well nourished.  She has recently been given activated  charcoal.  Gag reflex is still positive.  HEENT:  Pupils equal, round and reactive to light.  Ears, nose, and throat  are normal.  NECK:  Supple.  HEART:  Auscultation showed no murmurs with a regular rhythm.  Blood  pressure was 100/70, pulse thready.  EXTREMITIES:  The patient was moving all four limbs, but tone was decreased.  ABDOMEN:  Soft, nontender.  Liver and spleen not  palpable.   LABORATORY DATA:  Urinalysis was negative.  Alcohol level was not  detectable.  Salicylate level was also negative.  CBC showed a WBC of 9.4,  hemoglobin of 12.7, and platelet count of 190.  Her chemistry profile showed  a slightly elevated glucose of 149.  BUN and creatinine were normal, as were  sodium and potassium.  Acetaminophen level was 117.6, which was elevated.  __________ unclear.  ABG showed a pH of 7.49, with a slight alkalosis, with  a bicarb of 18.5.  Her PT and PTT were normal.   IMPRESSION:  Multiple drug overdose, probably oral ingestion of Keppra, as  the bottle is empty.  Also Robaxin, exact tablets unclear, and  acetaminophen.  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   PLAN:  Patient admitted to the hospital for __________ treatment for  acetaminophen overdose and supportive care for Robaxin and Keppra overdose.  Her EKG showed bradycardia, which could reflect her atenolol __________.  The patient will have an NG tube placed for the Mucomyst.  We will consult  Dr. Sherene Sires, pulmonary critical care,  for central line placement for better IV  access and blood draws, as attempt by the ER physician was unsuccessful.  We  will hold all her other medications including pain medications for now.  The  patient will need psychiatric evaluation when she is well enough.  Her  mother was as her bedside and has been advised of patient's __________.                                                Ralene Ok, M.D.    RM/MEDQ  D:  04/03/2004  T:  04/03/2004  Job:  [id number]

## 2011-02-05 NOTE — H&P (Signed)
[name], [name]          ACCOUNT NO.:  [id number]   MEDICAL RECORD NO.:  [id number]          PATIENT TYPE:  IPS   LOCATION:  0405                          [facility]ACILITY[facility]  [facility]   PHYSICIAN:  Geoffery Lyons, M.D.      DATE OF BIRTH:  [birth year]   DATE OF ADMISSION:  10/24/2005  DATE OF DISCHARGE:                         PSYCHIATRIC ADMISSION ASSESSMENT   IDENTIFYING INFORMATION:  A 45 year old divorced white female, voluntarily  admitted on October 24, 2005.   HISTORY OF PRESENT ILLNESS:  The patient is a transfer from [facility] after overdosing on 75-100 Tylenol Gelcaps; 8 morphine tablets; and  15-mcg patches of fentanyl, using 6-7 at home, with her intention of hoping  to go quietly in her sleep.  The patient states she is just tired of all the  peaks and valleys of her depression.  The patient states she has no  interest in things, has dropped out of school from her depression.  She  states a history of substance abuse but has been sober for 14 months from  her periods of binge drinking.  The patient states she has tried all  medicines.  She denies any specific stressors.  She has been sleeping  satisfactorily, has a history of eating disorder, not currently active.  No  psychotic symptoms.  The patient states that she is complaining of some  nausea, but feels that is due to the Mucomyst that she received.   PAST PSYCHIATRIC HISTORY:  Second admission to [facility].  Here about 2 years ago.  She has a Paramedic, first name Durward Mallard.  Has a  history of overdosing 6 times before, on Tylenol.  The patient states that  in the past she has been on all psychiatric medications.   SOCIAL HISTORY:  She is a 45 year old divorced white female with no  children, currently living with her mother.  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  No legal issues.   FAMILY HISTORY:  Denies.   ALCOHOL DRUG HISTORY:  The patient smokes.  Denies any other drug use.   PAST  MEDICAL HISTORY:  Primary care Noelie Renfrow is Dr. Ellamae Sia in  [place].  Sees Dr. Thomasena Edis at [facility].  Medical problems are migraines and anorexia.   MEDICATIONS:  Medications are Atenolol 100 mg daily, hydrochlorothiazide 25  mg daily.  Was on a Duragesic patch and morphine for breakthrough pain.   DRUG ALLERGIES:  LATEX, from which she reports a rash.  ASPIRIN, from which  she reports anaphylaxis.  NSAIDS--anaphylaxis.  DOXYCYCLINE--hallucinations.  IVP DYES--anaphylaxis.  TRIPTANS--reporting elevated blood pressure.   PHYSICAL EXAMINATION:  The patient was assessed at [facility].  The patient,  today, is complaining of some nausea.  She appears in no acute distress.   LABORATORY DATA:  Her BUN was 2.  Liver function tests:  Her AST was  elevated at 185, down to 43 prior to transfer. ALT was elevated at 120, down  to 66 prior to transfer.  Her Tylenol level was elevated at 160.6, less than  0.10 prior to transfer.   MENTAL STATUS EXAM:  She is in the bed.  She is awake, with good eye  contact.  Speech is normal in rate and tone.  The patient feels nauseated.  Her affect is constricted.  Thought processes are coherent.  No evidence of  psychosis.  Cognitive function intact.  Memory is good.  Judgment is poor.  Insight is limited.   ADMISSION DIAGNOSES:  AXIS I:  Major depressive disorder, recurrent, severe,  post-traumatic stress disorder, rule out bipolar disorder 2.  AXIS II:  Borderline personality disorder, per chart.  AXIS III:  Status post Tylenol overdose, hypertension, chronic back pain.  AXIS IV:  Other psychosocial problems, medical problems, education problems.  AXIS V:  Current is 25, estimated this past year is 60.   PLAN:  Plan is to stabilize mood and thinking, contract for safety.  We will  monitor intake.  We will contact pain clinic for recommendations for pain  control.  The patient may benefit from Lamictal.  We  will have a family  session with mother for support.  The patient needs to continue with her  therapy and her followup specialist appointments.   TENTATIVE LENGTH OF CARE:  4-6 days.      Landry Corporal, N.P.      Geoffery Lyons, M.D.  Electronically Signed    JO/MEDQ  D:  10/25/2005  T:  10/25/2005  Job:  [id number]

## 2011-02-05 NOTE — Assessment & Plan Note (Signed)
MEDICAL RECORD NUMBER:  [id number].   [name] returns to the clinic today for followup evaluation. Since last  clinic visit, the patient had arthroscopic surgery of her right shoulder  approximately three weeks' ago with Dr. Ranell Patrick. She apparently had bone  spurs that were shaved that were causing problems with her rotator cuff on  that side. She has been doing outpatient physical therapy at [facility]. She is wondering if she could do some water aerobics and  water therapy to see if she could help with her back pain.   Specifically, regarding her back pain, she reports that she is not getting  as much relief as she hoped with the MS Contin 15 mg q.i.d. She has 72  tablets remaining which covered her for 18 days. She reports having been on  Dilaudid and Robaxin after her scoping. She understands that we cannot  prescribe Dilaudid for her. She would like to try the Robaxin as that did  give her some relief in terms of her back despite having been started for  her shoulder.   MEDICATIONS:  1.  Atenolol 100 mg q.d.  2.  Estrace 1 mg q.d.  3.  MS Contin extended release 15 mg q.i.d.  4.  Klonopin 0.5 mg p.r.n.  5.  Tylenol p.r.n.   PHYSICAL EXAMINATION:  Well appearing adult female in no acute discomfort.  Examination of her shoulders show well healed arthroscopic scars. She has  strength at 5-/5 throughout the bilateral upper and lower extremities. Bulk  and tone were normal, and reflexes were 2+ and symmetrical.   IMPRESSION:  1.  Mid thoracic pain related to T7-8 herniated nucleus pulposus without      neurological change.  2.  Reported degenerative disk disease of the lumbar spine at L4-5.  3.  Status post right rotator cuff surgery, arthroscopically.   In the office today, we did start the patient on Robaxin 500 mg 1 tablet  p.o. t.i.d. I also gave her a prescription for outpatient PT to work on  lumbar strengthening and aerobic exercises in the water through  hand and  rehabilitation at [facility].   In terms of the pain medicine, she will be using up the MS Contin 15 mg  q.i.d. We will then start her on MS Contin 30 mg 1 tablet q.12h. The total  dose will remain at 60 mg q.d., but it may be more helpful to have the  stronger dose twice a day instead of the 15 mg 4 times per day. In any  event, we will see how she responds to that once she starts it mid June  2006. We will plan on seeing her in followup in approximately one month's  time.      DC/MedQ  D:  02/18/2005 10:55:36  T:  02/18/2005 12:34:49  Job #:  [id number]

## 2011-02-05 NOTE — Assessment & Plan Note (Signed)
MEDICAL RECORD NUMBER:  [id number]   [name] returns to the clinic today for follow-up evaluation. She  reports that she did have an overdose of Tylenol intentionally October 21, 2005. She was in the intensive care unit for several days and then the  stepdown unit, and eventually went home October 29, 2005. She saw Dr. Dub [name]  from [facility] while she was in the hospital and was placed on  Lamictal, Seroquel and Klonopin. She continues to see Dr. Lance [name], her  psychiatrist, along with her psychologist, [name].   The patient reports that she has a history of periodic Tylenol overdoses.  She reports that she would never try that with any of her narcotic  medication. 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 The patient does need a refill on her  fentanyl patch in the office today but she does have sufficient morphine.  She also is not using her Robaxin and asked for a substitute for that.   MEDICATIONS:  1.  Atenolol 100 mg q.i.d.  2.  Estrace 1 mg daily.  3.  Morphine sulfate immediate-release 15 mg one tablet q.i.d. p.r.n.  4.  Fentanyl patch 50 mcg per hour, changed q.72h.  5.  Robaxin 500 mg t.i.d. p.r.n. (not taking at present).   REVIEW OF SYSTEMS:  Positive for poor appetite, night sweats, weight gain,  diarrhea, vomiting, nausea.   PHYSICAL EXAMINATION:  Reasonably well-appearing adult female in mild to no  acute discomfort. Blood pressure 117/59 with a pulse of 93, respiratory rate  16, and O2 saturation 98% on room air. She has 5-/5 strength throughout the  bilateral upper and lower extremities. She ambulates without any assistive  device. Reflexes were 2+ and symmetrical   IMPRESSION:  1.  Mid thoracic pain related to T7-8 herniated nucleus pulposus without      neurological compromise.  2.  Degenerative disc disease of the lumbar spine at L3-4 and L4-5.  3.  Status post rotator cuff surgery on the right with subsequent re-injury.   In the office today, no refill on the morphine sulfate is necessary at this  time. We did start her on Soma 350 mg one tablet p.o. b.i.d. in place of her  Robaxin that she is not taking. We also refilled her Duragesic 50 mcg per  hour as of March 15. We will plan on seeing her in follow-up in this office  in approximately 2 months' time.           ______________________________  Ellwood Dense, M.D.     DC/MedQ  D:  11/26/2005 10:15:34  T:  11/26/2005 15:07:21  Job #:  [id number]

## 2011-02-05 NOTE — Assessment & Plan Note (Signed)
[name] returns to clinic today for follow-up evaluation.  She has been  using MS Contin 30 mg q.12h. along with the morphine sulfate immediate  release 15 mg q.i.d.  She reports that she is sleeping poorly at the present  time.  We did review the MRI scan of her thoracic and lumbar spine done  May 19, 2005.  The impression on the thoracic spine was right paracentral  disc protrusion at T7-8 with mild cord flattening.  No abnormal cord signal  or significant spinal stenosis was identified.  This was not significantly  changed compared to a prior study of November 2005.  We also did a new study  for lumbar spine involving an MRI study.  This showed mild annular bulging  at L3-4 and L4-5.  No prior study was available for comparison.  There was  no spinal stenosis nor disc protrusion of any significance present in the  lumbar spine.   The patient is concerned that she is not getting better.  I have explained  to her again that the condition of her thoracic spine at the T7-8 level is  not likely to get back to normal and hopefully will not worsen. She  certainly is not in need of surgery at this time and Dr. Otelia Sergeant has already  seen her in November and told her that she is not a surgical candidate.  The  latest MRI scan does not show any significant worsening.  She has been told  that the only activities that can probably worsen her condition are an  accident or a fall or heavy lifting on a repetitive basis.  Certainly,  personal habits such smoking may eventually slowly cause deterioration of  her vertebral discs.  She has been cautioned against continuation of tobacco  use.   MEDICATIONS:  1.  Atenolol 100 mg q.i.d.  2.  Esterase 1 mg daily.  3.  MS Contin extended release 30 mg q.12h.  4.  Morphine sulfate immediate release 15 mg q.i.d. p.r.n.  5.  Klonopin 0.5 mg p.r.n.  6.  Tylenol p.r.n.   REVIEW OF SYSTEMS:  Positive for weight gain, night sweats, nausea,  vomiting,  diarrhea, poor appetite, limb swelling, shortness of breath.   PHYSICAL EXAMINATION:  GENERAL APPEARANCE:  A well-appearing, fit adult  female in mild acute discomfort.  VITAL SIGNS:  Blood pressure 116/73 with pulse 73, respiratory rate 16 and  O2 saturation 98% on room air.  NEUROLOGIC:  She has 5-/5 strength throughout the bilateral upper and lower  extremities.  Bulk and tone were normal.  Reflexes were 2+ and symmetrical.   IMPRESSION:  1.  Mid thoracic pain related to T7-8 herniated nucleus pulposus without      neurological compromise.  2.  Reported degenerative disc disease of the lumbar spine at L3-4 and L4-5.  3.  Status post right rotator cuff surgery and subsequent injury.   In the office today, we did refill the patient's methocarbamol along with  her morphine sulfate extended release.  She just has had a refill on the  immediate release medication.  Will plan on seeing her in follow-up in  approximately one to two months' time with refill of medication prior to  that appointment as necessary.           ______________________________  Ellwood Dense, M.D.    DC/MedQ  D:  05/26/2005 15:00:38  T:  05/26/2005 18:45:51  Job #:  [id number]

## 2011-02-05 NOTE — Assessment & Plan Note (Signed)
[name] returns to clinic today for follow-up evaluation.  She reports  that she is doing well on her morphine sulfate used anywhere from two to  three times per day.  She does need a refill on that in the office today.  She has a sufficient supply of Soma at the present time.   The patient was water skiing this past weekend and twisted her left ankle  which is slightly puffy on the lateral aspect.  She also is complaining of  some muscle aching through her shoulders and arms secondary to the water  skiing activity.   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  She plans to attend either [employer] or possibly  local community college for an associate's degree before proceeding on to  get a four-year degree.   MEDICATIONS:  1. Estrace 1 mg daily.  2. Hydrochlorothiazide 25 mg daily.  3. Morphine sulfate immediate release 15 mg one tablet t.i.d. p.r.n. (two      to three per day).  4. Soma 350 mg one tablet two to three times per week.   REVIEW OF SYSTEMS:  Noncontributory.   PHYSICAL EXAMINATION:  GENERAL:  Well-appearing, fit adult female in no  acute discomfort.  VITAL SIGNS:  Blood pressure 121/74 with a pulse 92, respiratory rate 16,  and O2 saturation 99% on room air.  EXTREMITIES:  She is 5-/5 strength throughout the bilateral upper  extremities with some complaints of muscle aching.  She has slightly puffy  left ankle on the lateral aspect, but is able to ambulate without any  assistive device.   IMPRESSION:  1. Mid thoracic pain related to T7-T8 herniated nucleus pulposus without      neurological compromise.  2. Degenerative disk disease of the lumbar spine L3-L5.  3. Status post rotator cuff surgery of the right upper extremity with      subsequent re-injury.   At the present time the patient is doing well and we have refilled her  morphine sulfate immediate release.  No refill on the Tresa Garter is necessary.  Will plan on seeing the patient in follow-up in approximately three months'  time.  She is getting good analgesic effect and is getting gradually more  active.  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  Will  plan on seeing her as noted above.           ______________________________  Ellwood Dense, M.D.     DC/MedQ  D:  04/25/2006 09:25:20  T:  04/25/2006 09:40:25  Job #:  [id number]
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
## 2011-02-05 NOTE — Op Note (Signed)
[name], SIPE                            ACCOUNT NO.:  [id number]   MEDICAL RECORD NO.:  [id number]                   PATIENT TYPE:  INP   LOCATION:  0349                                 FACILITY[facility]   PHYSICIAN:  Anselmo Rod, M.D.               DATE OF BIRTH:  04/29/1966   DATE OF PROCEDURE:  03/14/2004  DATE OF DISCHARGE:  03/14/2004                                 OPERATIVE REPORT   PROCEDURE:  Flexible sigmoidoscopy.   ENDOSCOPIST:  Anselmo Rod, M.D.   INSTRUMENT USED:  Olympus video colonoscope.   INDICATIONS FOR PROCEDURE:  Severe diarrhea in a 45 year old white female,  rule out C Dif colitis, proctitis, etc.   PREPROCEDURE PREPARATION:  Informed consent was procured from the patient.  The patient fasted for eight hours prior to the procedure.   PREPROCEDURE HISTORY AND PHYSICAL:  The patient had stable vital signs. Neck  supple. Chest clear to auscultation. S1, S2 regular. Abdomen soft with  normal bowel sounds.  Some left lower quadrant tenderness on palpation and  guarding noted around the region of the hepatosplenomegaly.   DESCRIPTION OF PROCEDURE:  The patient received 2 g of Ativan prior to the  procedure when she was in her room and received 100 mg of Demerol and 10 mg  of Versed in slow incremental doses.  The patient placed in the left lateral  position and the video colonoscope was advanced in the rectum to 20 cm.  There was solid stool beyond that point and the scope had to be withdrawn.  Multiple washings were done and the stool was washed off. No ulcers,  erosions or exudates were noted. Retroflexion in the rectum revealed no  evidence of hemorrhoids.  The patient tolerated the procedure well without  complications.   IMPRESSION:  Unrevealing flexible sigmoidoscopy up to 20 cm, no hemorrhoids  seen on retroflexion.   RECOMMENDATIONS:  1. Avoid narcotics, question psyche consult.  2. Colonoscopy at a later date considering the patient's  family history of     colon cancer and occasional BRBPR.  3. Outpatient followup.  The patient's situation is complicated. She does     not want me to continue providing her GI care and has requested Dr.     Barbee Shropshire to get her a new gastroenterologist.  I was not aware of this     prior to the     procedure and therefore I will talk to the patient when she recovers from     her sedation and make arrangements as is best for the patient after we     discuss the results of the procedure with the patient's mother over the     phone at the patient's request.  Anselmo Rod, M.D.    JNM/MEDQ  D:  03/14/2004  T:  03/14/2004  Job:  [id number]   cc:   Olene Craven, M.D.  [address]  [address]  [place]  [place] [address]  Fax: [phone]

## 2011-02-05 NOTE — H&P (Signed)
NAMEKRYSSA, RISENHOOVER          ACCOUNT NO.:  [id number]   MEDICAL RECORD NO.:  [id number]          PATIENT TYPE:  INP   LOCATION:  2904                         FACILITY:  [facility]   PHYSICIAN:  Pearlean Brownie, M.D.DATE OF BIRTH:  Oct 04, 1965   DATE OF ADMISSION:  10/22/2005  DATE OF DISCHARGE:                                HISTORY & PHYSICAL   CHIEF COMPLAINT:  Overdose.   HISTORY OF PRESENT ILLNESS:  Ms. Mahler is a 45 year old female with  multiple psychiatric issues who was brought to the ED by EMS after an  overdose of Tylenol.  She took approximately 90 gel caps as well as five  fentanyl patches, a handful of morphine pills, and Klonopin between 11 and  12 p.m. last night.  The patient sites multiple social stressors leading up  to her actions.  Patient was unsure if she wanted to end her life.  The  patient's friend had called her immediately after she had taken the  medication and phoned EMS.  The patient has had multiple suicide attempts in  the period.  Currently she claims she is not actively suicidal in the ED.  In the ED she was given charcoal and loaded with Mucomyst and given  potassium 10 mEq IV as well.  Her first Tylenol level at 2 a.m. which was  about three hours post ingestion was 260 and this is likely in the  hepatotoxic range.  The patient is now feeling nauseated and having  abdominal pain.  She was recently discharged from the hospital 10 days ago  with a GI viral illness and atypical chest pain.   PAST MEDICAL HISTORY:  1.  Anorexia nervosa.  2.  Anxiety.  3.  Depression.  4.  Migraines.  5.  Endometriosis.  6.  Borderline personality disorder.  7.  Pseudo seizures.  8.  PTSD.  9.  Status post hysterectomy.  10. Status post appendectomy.  11. Status post cholecystectomy.  12. Multiple suicide attempts.   MEDICATIONS:  1.  Atenolol 100 mg p.o. daily.  2.  Hydrochlorothiazide 25 mg p.o. daily.  3.  Fentanyl transdermal patch 50 mcg  every 72 hours.  4.  Robaxin as directed.  5.  Klonopin 1 mg p.o. t.i.d.   ALLERGIES:  ASPIRIN causes anaphylaxis, NSAIDs cause anaphylaxis, STEROIDS  cause high blood pressure and heart rate, DOXYCYCLINE causes hallucinations,  IV DYE causes anaphylaxis, TRIPTANS cause increase blood pressure and  increased heart rate, LATEX.   FAMILY HISTORY:  Significant for father dying at age 36 secondary to an MI.  Maternal grandparents have a history of stomach and prostate cancer.   SOCIAL HISTORY:  She lives with her mom.  She is a Physicist, medical at  [employer]  She smokes one pack a day.  She has a history of alcohol dependence,  though she has been sober for 14 months and attending AA.   PHYSICAL EXAMINATION:  VITAL SIGNS:  Temperature ranged between 95.1-97,  pulse 76, blood pressure 112/72 down to 96/34, respirations 16, O2 100% on  room air.  GENERAL:  The patient is sleepy, but awakens with verbal stimulation and  responds appropriately and in complete sentences, although she is sometimes  slow to respond at times.  She is oriented x3.  HEENT:  Head is normocephalic, atraumatic.  Pupils are equal, round, and  reactive to light and accommodation.  Extraocular movements intact.  Oropharynx without erythema or exudate.  Dry mucous membranes.  NECK:  Supple.  No lymphadenopathy.  CARDIOVASCULAR:  Regular rate and rhythm.  Pulses 2+ and equal throughout.  She has a grade 2/6 systolic ejection murmur at the left upper sternal  border.  LUNGS:  Clear to auscultation bilaterally with good air movement.  Patient  is breathing comfortably and at a normal rate.  ABDOMEN:  Decreased bowel sounds, soft, nontender, nondistended.  No  hepatosplenomegaly.  NEUROLOGIC:  Cranial nerves II-XII grossly intact.  Cerebellar function  grossly intact.  Sensation normal throughout.  Strength about 5/5  throughout.   LABORATORIES:  EKG normal sinus rhythm, right axis deviation, normal  intervals.  No ST or T  changes.  UDS positive for benzos and opiates.  Alcohol less than 5.  Tylenol level 260.8 three hours post ingestion and  253.3 seven hours post ingestion.  Salicylates negative.  White count 9.7,  hemoglobin 12.2, hematocrit 34.8, platelets 316.  Total protein 6.4, AST 23,  ALT 14, alkaline phosphatase 57, Tbili 0.8.  Sodium 132, potassium 3,  chloride 99, bicarbonate 30, BUN 15, creatinine 1.2, glucose 122.   ASSESSMENT/PLAN:  This is a 45 year old female status post acetaminophen and  multiple medication overdose.  1.  Drug overdose.  Her Tylenol first level was in the hepatotoxic range on      the Tylenol nomogram as is the second level.  Poison control has been      contacted once already and will be calling us back again shortly.  We      have loaded her with Mucomyst and will probably need 17 doses every four      hours.  We will need to check a PT/INR and follow a CMP and CBGs on her.      We will be following her Tylenol levels.  Should her INR bump over 2 she      may be at the point where she needs to be transferred for a liver      transplant.  Will give her Zofran for nausea and Protonix for GI upset.  2.  Hypokalemia.  We will replete her with four runs of KCl and recheck.  3.  Psych.  We will get a sitter and have her on suicide precautions.  We      will call psych in the morning.  4.  Fluid, electrolytes, nutrition.  We will give a 500 mL bolus and then      maintenance intravenous fluids and then a low salt diet.      Altamese Cabal, M.D.    ______________________________  Pearlean Brownie, M.D.    KS/MEDQ  D:  10/22/2005  T:  10/22/2005  Job:  [id number]

## 2011-02-05 NOTE — Discharge Summary (Signed)
NAMESHARAYAH, [name]                            ACCOUNT NO.:  [id number]   MEDICAL RECORD NO.:  [id number]                   PATIENT TYPE:  INP   LOCATION:  0373                                 [facility]ACILITY[facility]  [facility]   PHYSICIAN:  Lonia Blood, M.D.                   DATE OF BIRTH:  11-02-1965   DATE OF ADMISSION:  06/04/2004  DATE OF DISCHARGE:  06/06/2004                                 DISCHARGE SUMMARY   PRIMARY CARE PHYSICIAN:  Dr. Kern Reap.   PSYCHIATRIST:  Dr. Dub Mikes.   NEUROLOGIST:  Dr. Lesia Sago   REASON FOR ADMISSION:  1.  Drug overdose secondary to Tylenol intoxication.  2.  Suicide attempt.  3.  Depression.  4.  Alcohol intoxication.  5.  Migraine headaches.   CONSULTATIONS:  Dr. Jeanie Sewer, psychiatrist.   DISCHARGE MEDICATIONS:  1.  Lexapro 20 mg daily.  2.  Estradiol 1 mg daily.  3.  Atenolol 100 mg daily.  4.  Oxycodone 5 mg q.6h. p.r.n. pain.   DISPOSITION:  The patient is to follow with Dr. Lesia Sago on June 17, 2004 and her psychiatrist, Dr. Dub Mikes on this coming Tuesday.  She is also  to follow with Dr. Barbee Shropshire, who is her primary care physician.  She has  been instructed to call on Monday for an appointment.   PROCEDURES PERFORMED:  1.  Head CT performed on June 04, 2004, showed no acute disease.  2.  She had a chest x-ray performed on June 04, 2004, also that shows      clear lungs.   BRIEF HISTORY AND PHYSICAL:  This is a 45 year old female with history of  severe migraine headaches, previous drug overdose in August 2005, and  depression.  The patient was admitted secondary to Tylenol overdose.  Please  refer to history and physical as dictated on June 05, 2004.  Per  patient, she was having severe headache that was not relieved by any  medication.  She was scheduled to meet with Dr. Anne Hahn at the end of this  month for further adjustment to her medications.  The headache was so severe  that she decided to take Tylenol  Extra-Strength, and she took 100 pills at  once.  She collapsed while in the emergency room and was subsequently  intubated.   HOSPITAL COURSE:  1.  Drug overdose.  The patient was initially intubated, placed on      mechanical ventilation; however, she recovered a few hours later and was      extubated.  Mucomyst protocol was started according to [employer].      The patient's Tylenol level at the time of admission was as high as 369      but was less than 10 today at the time of discharge.  Supportive      measures were mainly instituted with IV fluids in addition to  the      Mucomyst as well as NG tube aspiration in the ED.  She did not get any      activated charcoal, but she seemed to have recovered very well, probably      because she came in on time.  2.  Suicide attempt.  Per her mom, she believed the patient was probably      trying to take her life.  She __________ earlier in August, but patient      now denies this.  She said it was the extreme headache that drove her to      do that.  She was also found to have an alcohol level of 89, indicating      that she was intoxicated at the time.  Psychiatry was subsequently      consulted, and Dr. Jeanie Sewer evaluated patient, cleared patient for      discharge, and increased her Lexapro to 20 mg daily.  She will be seen      by Dr. Dub Mikes within the next week for further adjustment of her      medications.  3.  Migraine headaches.  These are severe and continuous with the patient.      She has had multiple medications in the past __________.  She has an      appointment with Dr. Anne Hahn next week.  She has tried all sorts of      medications including Zanaflex, Robaxin, Midrin, etc.  But patient      stated they do not work for her, and she really does not want to take      Midrin.  She is also allergic to the [facility], so she does not want to      give them a try.  The atenolol is the only drug that seems to help her.      So, I  am discharging her on the atenolol, the Lexapro and oxycodone      which she said helps a little bit, until she sees Dr. Anne Hahn.  4.  Depression.  This is also an old problem, and the Lexapro hopefully will      help the patient.      LG/MEDQ  D:  06/06/2004  T:  06/07/2004  Job:  [id number]   cc:   Olene Craven, M.D.  [address]  [address]  [place]  [place] [address]  Fax: [phone]   Geoffery Lyons, M.D.   Marlan Palau, M.D.  [address]  [address]  [place]  [place] [address]  Fax: [phone]

## 2011-02-05 NOTE — H&P (Signed)
NAMETHRESEA, DOBLE                            ACCOUNT NO.:  [id number]   MEDICAL RECORD NO.:  [id number]                   PATIENT TYPE:  INP   LOCATION:  0161                                 FACILITY:  [facility]   PHYSICIAN:  Lonia Blood, M.D.                   DATE OF BIRTH:  [birth year]   DATE OF ADMISSION:  06/05/2004  DATE OF DISCHARGE:                                HISTORY & PHYSICAL   PRIMARY CARE PHYSICIAN:  Dr. Demetrios Isaacs. [place]we[place].   PSYCHIATRIST:  Dr. Geoffery Lyons.   REASON FOR ADMISSION:  Drug overdose.   HISTORY OF PRESENT ILLNESS:  This is a 45 year old white female with history  of migraine headaches and previous drug overdose in August 2005, who came in  accompanied by her boyfriend secondary to ingestion of about 100 pills of  Extra-Strength Tylenol.  Per patient's mother, she was doing okay today,  although boyfriend said she had some headaches.  She was doing okay, up  until this night, however, she went into an argument with her boyfriend; she  left her mother's house abruptly.  On arrival to the ED, she was alert and  talking to the ED physician, when she passed out.  She quickly lost  consciousness.  The patient was promptly intubated and placed on the  ventilator.  Initial NG suction showed a number of un-dissolved pills.  The  patient apparently saw Dr. Dub Mikes, her psychiatrist, this past week and  according to her mom, he was pleased with her progress.  She has avoided  Tylenol and has been telling her mom she is worried because her LFTs were  elevated from previous obtained in August 2005, for which she was admitted  to [facility] and subsequently [facility].   PAST MEDICAL HISTORY:  1.  Migraine headaches.  2.  History of depression.  3.  Borderline personality disorder.  4.  Previous drug overdose in August of 2005 and suicide attempt for which      she was discharged on May 20, 2004 from [facility].  5.  She is status post  hysterectomy for endometritis.  6.  Status post cholecystectomy.   ALLERGIES:  The patient has reported allergies in her chart including  ASPIRIN, NSAIDS, SULFA, LIDOCAINE, COMPAZINE and PHENERGAN.   MEDICATIONS:  Her medication according to discharge note on May 20, 2004  include:  1.  Atenolol 100 mg daily.  2.  Estradiol 1 mg daily.  3.  Keppra 500 mg daily.  4.  Effexor XR 37.5 mg daily.  5.  Robaxin 100 mg q.6 h. p.r.n.  6.  Midrin p.r.n. for migraine headaches.  7.  Zanaflex 4 mg daily.  8.  Lexapro 10 mg daily.  9.  Klonopin 0.5 mg daily.   SOCIAL HISTORY:  The patient lives with her mom here in [place].  She  also has a  boyfriend who accompanied her here.  She smokes about a pack per  day.  She drinks alcohol sparingly, but denies any IV drug use.   FAMILY HISTORY:  No significant psychiatric illness in the family.   REVIEW OF SYSTEMS:  Review of systems is not obtainable, as the patient is  obtunded.   PHYSICAL EXAMINATION:  VITAL SIGNS:  On exam, temperature is 98.2, blood  pressure 101/65, pulse of 93, respiratory rate of 16, SATS 98% on room air  prior to her intubation.  She is currently on the vent, PRVC with a rate of  12, PEEP +5, tidal volume 500, FIO2 1.0 with saturations 100%.  GENERAL:  Generally, the patient is on the vent and sedated.  HEENT:  Pupils reactive to light.  She had decreased gag reflex.  NECK:  Neck is supple, no JVD, no lymphadenopathy.  CHEST:  Her chest is clear to auscultation bilaterally with good air entry.  CARDIOVASCULAR:  She is slightly tachycardic.  ABDOMEN:  Abdomen is soft with positive bowel sounds.  EXTREMITIES:  Extremities show no edema, cyanosis or clubbing.   LABORATORIES:  Initial labs showed a white count of 10.1, hemoglobin 14.1,  platelets 407,000.  Sodium is 143, potassium 4.2, chloride 111, CO2 24, BUN  10, creatinine 0.6, glucose 115.  Her LFTs at this point are all normal.  Urine drug screen is also  negative.  Her acetaminophen level is 369.6,  salicylate level less than 4.  Pregnancy test is negative.  Her alcohol  level is 89.  Her ABG on the vent:  A pH of 7.377, PCO2 41, PO2 550, bicarb  of 23 and SATs 100%.   ASSESSMENT:  This is a 45 year old female with drug overdose and potentially  a suicide attempt.   PLAN:  1.  Drug overdose:  The patient is not currently on the vent.  We will admit      to the ICU and institute supportive care.  We will give her some IV      Mucomyst.  Hydrate patient.  We will also get critical care medicine      more for vent management.  We will provide a sitter for the patient and      continue to monitor her LFTs overnight.  2.  Suicide attempt:  This is a repeated episode and the patient is already      being seen by a psychiatrist.  We will put suicide precautions and a      sitter in her room.  Once she is off the vent and stable, we will re-      consult psychiatry for further management.  3.  Depression:  We will continue as in #2.  4.  Alcohol intoxication:  This may have worsened her condition and her drug      overdose.  We will also continue with supportive care.  We will give      thiamine and folate at this point.                                               Lonia Blood, M.D.    Verlin Grills  D:  06/05/2004  T:  06/05/2004  Job:  [id number]   cc:   Olene Craven, M.D.  [address]  [address]  [place]  [place] [address]  Fax: [phone]  Geoffery Lyons, M.D.

## 2011-02-05 NOTE — Discharge Summary (Signed)
[name], [name]                            ACCOUNT NO.:  [id number]   MEDICAL RECORD NO.:  [id number]                   PATIENT TYPE:  INP   LOCATION:  0349                                 FACILITY:  [facility]   PHYSICIAN:  Olene Craven, M.D.            DATE OF BIRTH:  1966-08-12   DATE OF ADMISSION:  03/12/2004  DATE OF DISCHARGE:  03/14/2004                                 DISCHARGE SUMMARY   DISCHARGE DIAGNOSES:  1. Protracted nausea and vomiting.  2. Left lower quadrant abdominal pain.  3. Hypovolemia.  4. Diarrhea.  5. Anxiety/depression.  6. Migraine.   DISCHARGE MEDICATIONS:  1. Effexor XR 37.5 mg daily.  2. Estradiol 1 mg daily.  3. Robaxin 500 mg on p.o. q.6h. p.r.n.  4. Thorazine 25 mg one to two tablets p.o. q.6h. p.r.n.  5. Zanaflex 4 mg q.8h. p.r.n.  6. Klonopin 0.5 mg b.i.d.  7. Atenolol 100 mg p.o. daily.  8. Lexapro 10 mg p.o. daily.  9. Percocet 5/325 mg one p.o. q.6h. p.r.n., #30, no refills.   CONSULTATIONS:  Dr. Anselmo Rod.   PROCEDURE:  Colonoscopy with normal mucosa up to 25 cm; too much stool to go  any further.   HOSPITAL COURSE:  The patient was admission on March 12, 2004, after a  several week history of protracted nausea and vomiting, and continued  abdominal pain. She had negative CT scans as an outpatient. Scheduled her  for outpatient workup and she reported to the emergency department claiming  inability to hold down any food or drink, and dehydration. She was admitted  for IV hydration and pursued GI workup. She was consulted by Dr. Loreta Ave and  Dr. Loreta Ave attempted lower endoscopy; however, it was difficult to complete  secondary to content of stool. She was monitored during the hospitalization  without any episode of true vomiting. She had multiple small loose mucosy  stools, however not to the extent as reported as an outpatient. She was  discharged on March 14, 2004, after further evaluation. She is to have a  complete evaluation  as an outpatient after a thorough cleansing. Possible  psych evaluation may be in order due to her continued complaints with no  physical findings to validate. She continues to  complain of migraine throughout the course of hospitalization. She is seen  at the headache clinic and would recommend that she follow up with the  headache clinic upon discharge. She is to follow with Dr. Barbee Shropshire in one  week's times.                                               Olene Craven, M.D.    DEH/MEDQ  D:  03/25/2004  T:  03/25/2004  Job:  [id number]   cc:  Anselmo Rod, M.D.  [address]  [address]  [place]  [place] [address]  Fax: [phone]

## 2011-02-05 NOTE — H&P (Signed)
[name], [name]          ACCOUNT NO.:  [id number]   MEDICAL RECORD NO.:  [id number]          PATIENT TYPE:  INP   LOCATION:  6743                         FACILITY:  [facility]   PHYSICIAN:  Leighton Roach McDiarmid, M.D.DATE OF BIRTH:  23-Jun-1966   DATE OF ADMISSION:  10/09/2005  DATE OF DISCHARGE:                                HISTORY & PHYSICAL   CHIEF COMPLAINT:  Vomiting.   PRIMARY CARE PHYSICIAN:  Dr. Merla Riches at [facility].   HISTORY OF PRESENT ILLNESS:  This is a 45 year old female with history of  anorexia, anxiety and depression who is presenting with vomiting and  diarrhea. The patient states she has had vomiting for approximately 3-4  weeks that is non bloody.  Her p.o. intake has been poor, mainly eating  broth over the last 3-4 weeks. She said the vomiting was worse today up to  17 times and is the reason why she came in. The patient states she has had  diarrhea for the past 2 days, loose stools, not frequent, no melena or  hematochezia. The patient also complains of chest pain that she says began  last night. Patient describes the chest pain as tightness substernally,  radiating to her back. She states that the chest pain is not as severe but  is still present. No diaphoresis associated, but patient states she did have  shortness of breath earlier with the chest pain. She mentions that she  experienced light headedness last week when she was walking. She also  reports having an aura for the past 2 days and states her headache has begun  since being in the emergency room and she is fearful of the pain being quite  severe. She would like to have pain medication for this to alleviate it. The  patient also mentions that her symptoms may be secondary to her thyroid. The  patient states she was seen at Urgent Care on October 08, 2005; lab work was  done and that she was told she is likely hyperthyroid.   ALLERGIES:  ASPIRIN (with anaphylaxis). NSAIDs (anaphylaxis).  SULFA (with  rash). COMPAZINE with a dystonic reaction. STEROIDS (increased blood  pressure and heart rate). DOXYCYCLINE (produces hallucinations). IVP DYE  (evokes anaphylaxis).  LATEX (causes a rash). TRITANS' (cause increased  blood pressure and heart rate).   CURRENT MEDICATIONS:  1.  Estrace 1 mg p.o. daily.  2.  Hydrochlorothiazide 25 mg p.o. daily.  3.  Fentanyl patch 50 mcg q.72 hours.  4.  Klonopin 2 mg p.r.n.  5.  Robaxin p.r.n.  6.  Atenolol 100 mg q.a.m.   PAST MEDICAL HISTORY:  1.  Significant for anorexia nervosa.  2.  Anxiety.  3.  Depression.  4.  Migraines.  5.  Endometriosis.  6.  Status post hysterectomy.   PAST SURGICAL HISTORY:  Appendectomy, a number of laparoscopic procedures  prior to hysterectomy. Cholecystectomy. Sphenoidectomy x2.   SOCIAL HISTORY:  The patient lives with her Mom. She is a full time Consulting civil engineer  at [employer]. She smokes approximately one pack per day. She has a history of  alcohol dependence, states she has been sober  for approximately 14 months  and is currently in A. [facility].  PSYCHE HISTORY:  The patient states that she has overdosed on Tylenol a  number of times and is fearful of Tylenol. States she was hospitalized each  time. Also states that there has been another suicide attempt that she was  hospitalized for. The patient does not go into details.   FAMILY HISTORY:  Father died at age 53 with a myocardial infarction. Stomach  and prostate cancer with the maternal grandparents.   PHYSICAL EXAMINATION:  VITAL SIGNS:  Temperature is 97.6, blood pressure  102/78, pulse 74, respiratory rate of 16, O2 saturation 100% on room air.  GENERAL:  The patient is alert, in no apparent distress. The room is dark.  HEENT:  Pupils are equal, round and reactive to light. Oropharynx is clear  with mild dryness of the mucosa.  NECK:  No thyromegaly, no lymphadenopathy.  CARDIOVASCULAR:  Regular rate and rhythm with a 1 out of 6 systolic ejection  murmur.   LUNGS:  Clear to auscultation bilaterally.  ABDOMEN:  Positive bowel sounds, soft, nontender, nondistended. No rebound  or guarding.  EXTREMITIES:  Have 2+ pulses and no edema.   LABS/PROCEDURES:  EKG shows sinus bradycardia with nonspecific ST and T wave  changes, mildly indicative of anterior infarct. Chest x-ray is NAD.  White blood cell count of 10,800, hemoglobin 14.2, platelets 392,000. I stat  shows sodium of 134, potassium 2.8, chloride 100, bicarb of 30, BUN of 24,  creatinine 1, and glucose of 81. Urine drug screen was positive for  benzodiazepines. Blood alcohol level was less than 5. Urinalysis was  completely negative.   ASSESSMENT AND PLAN:  A 45 year old female admitted with vomiting, diarrhea,  and dehydration.  1.  GI. The patient has had diarrhea x2 days with vomiting 3-4 weeks. I do      not think this is related to her anorexia. The patient reports nausea      and spontaneous emesis versus induced emesis that she states she has had      in the past with anorexia. Will monitor patient's I&Os strictly and      treat supportively. If patient tolerates p.o., will likely be discharged      tomorrow.  2.  Chest pain, atypical in nature. Considering her EKG was abnormal and      history of tobacco abuse, will rule out myocardial infarction with      serial enzymes. Will also check her TSH and fasting lipids.  3.  FEN. Patient has mild dehydration. Will hydrate with normal saline.      Potassium is low, she was given 40 mEq in the emergency department. Will      repeat her labs and supplement as needed. Will hold her      hydrochlorothiazide for now.  4.  Anorexia. There are no current signs or symptoms of anorexia at this      point. Patient states she would like to get into the [facility] eating disorder      center and is hoping that this could expedite her admission.  5.  Anxiety. Will continue her Klonopin p.r.n. 6.  Tobacco abuse. Discussed cessation with the patient. She  states that her      physician and AA members have counseled her not to do too many things at      once. She states she has been working on her steps in [place] and does not  feel like she is at the point where she can attempt to stop smoking.      Anastasio Auerbach, MD    ______________________________  Leighton Roach McDiarmid, M.D.    AD/MEDQ  D:  10/09/2005  T:  10/10/2005  Job:  [id number]   cc:   Harrel Lemon. Merla Riches, M.D.  Fax: [phone]

## 2011-02-05 NOTE — Assessment & Plan Note (Signed)
[name] returns to the clinic today for follow-up evaluation.  She  reports that overall she is doing much better.  She has decided to wean  herself off several medications and reports that even off the medications  her pain is better controlled.  She has stopped using the fentanyl patch  along with the Robaxin.  She has also stopped the atenolol that she was  using for migraine headaches and also has stopped the Klonopin.  She is  using the morphine sulfate immediate-release anywhere from two to three  times per day and along with the [facility] that gives her better relief than she  has had in a while.  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  She is involved  with Alcoholics Anonymous.   MEDICATIONS:  1.  Estrace 1 mg daily.  2.  Hydrochlorothiazide 25 mg daily.  3.  Morphine sulfate immediate release 15 mg one tablet b.i.d. to t.i.d.  4.  Soma 350 mg one tablet every week.   REVIEW OF SYSTEMS:  Positive for weight loss.   PHYSICAL EXAMINATION:  GENERAL:  Well-appearing, fit adult female in no  acute discomfort.  VITAL SIGNS:  Blood pressure and vitals were not yet obtained.  NEUROLOGIC:  She has 5-/5 strength throughout the bilateral upper and lower  extremities.  She ambulates without any assistive device.  Reflexes were 2+  and symmetrical throughout the bilateral upper and lower extremities.   IMPRESSION:  1.  Mid thoracic pain related to T7-8.  Herniated nucleus pulposus without      neurological compromise.  2.  Degenerative disk disease of the lumbar spine at L3-L4, L4-L5.  3.  Status post rotator cuff surgery of the right upper extremity with      subsequent re-injury.   In the office today no refill on her Tresa Garter is necessary.  She is using only  minimal amounts approximately once weekly.  We have given her a refill for  her  morphine sulfate immediate release as of Feb 05, 2006.  She continues to  use small amounts of that on a daily basis.  She is not using any fentanyl  and we have asked her to take the remaining patches back to the pharmacy to  see if she can get a refill.  She is not using any Klonopin or Robaxin at  this time.   We will plan on seeing her on follow-up in approximately three months' time  with refill of medication prior to that appointment if necessary.           ______________________________  Ellwood Dense, M.D.     DC/MedQ  D:  01/24/2006 12:40:49  T:  01/25/2006 10:56:06  Job #:  [id number]

## 2011-02-05 NOTE — Discharge Summary (Signed)
[name], [name]                            ACCOUNT NO.:  [id number]   MEDICAL RECORD NO.:  [id number]                   PATIENT TYPE:  IPS   LOCATION:  0505                                 FACILITY:  [facility]   PHYSICIAN:  Geoffery Lyons, M.D.                   DATE OF BIRTH:  07-10-1966   DATE OF ADMISSION:  04/07/2004  DATE OF DISCHARGE:  04/10/2004                                 DISCHARGE SUMMARY   CHIEF COMPLAINT AND PRESENT ILLNESS:  This was the second admission to [facility] for this 45 year old married white female  voluntarily admitted, transferred from the medical unit after an overdose of  Tylenol.  She had a couple of alcohol drinks but denied cocaine abuse.  Had  used marijuana 2 weeks prior.  She was depressed and upset that the husband  could not be treated for war injuries in [place] but would be moved  to [place].  Also upset about migraine headaches not being controlled.   PAST PSYCHIATRIC HISTORY:  No current psychiatric care.  Second time  [facility].  History of eating disorder, depression, and agitation.  There are 4 Tylenol overdoses.  Has been at [facility] in the past.   ALCOHOL AND DRUG HISTORY:  Minimizes the use of substances.  __________ use  of alcohol.   PAST MEDICAL HISTORY:  Migraine headaches.   MEDICATIONS:  1.  Keppra 500 daily for headaches.  2.  Effexor XR 37.5 mg daily.  3.  Estradiol 1 mg daily.  4.  Robaxin 100 mg every 6 hours.  5.  Zanaflex 4 mg daily, 2 at the onset of the headache and one every 6      hours.  6.  Atenolol 100 mg daily.  7.  Lexapro 10 mg daily.  8.  Klonopin 0.5 daily.   PHYSICAL EXAMINATION:  Performed and failed show any acute findings.   LABORATORY DATA:  CBC:  Hemoglobin 11.1.  Hematocrit 33.7.  Blood  chemistries:  Glucose 101.  SGOT 60.  SGPT 125.  Alkaline phosphatase 137.  Total bilirubin 0.5.  TSH 0.864.   MENTAL STATUS EXAM:  Reveals a fully alert please, cooperative female.  Blunted affect.  Speech, normal rate, production, and tempo.  Mood is  depressed.  Affect depressed.  Thought processes were logical and coherent.  Suicidal rumination.  No plans or homicidal ideas.  There are no delusional  or hallucinations.  Cognition well preserved.   ADMISSION DIAGNOSIS:   AXIS I:  1.  Depressive disorder, not otherwise specified.  2.  Polysubstance abuse.  3.  Post-traumatic stress disorder.   AXIS II:  No diagnosis.   AXIS III:  1.  Migraine headaches.  2.  Status post acetaminophen overdose.   AXIS IV:  Moderate.   AXIS V:  1.  Global assessment of functioning upon admission:  22.  2.  Highest global assessment of functioning in the last year:  11.   HOSPITAL COURSE:  She was admitted and started in intensive individual and  group psychotherapy.  She was maintained on her medications.  She had to be  given some Demerol and Phenergan for acute headache.  She was also given  Dilaudid.  She did endorse multiple symptoms of nightmares, flashbacks of  abuse.  Recurring nightmares.  Unable to handle the images.  She claimed  that she was rocking back and forth most of the night.  Mood was of anxiety  and depression.  Affect was anxious and depressed.  __________ headache, the  flashbacks.  There was a family session with the husband where it was clear  that there was a lot of conflict between them.  The concern for her  substance abuse was identified.   On April 10, 2004, she was endorsing she was doing well.  She was able to  deal with the conflict that was identified in the session with the husband.  She said that she would be ready to leave the husband if he has to leave  town.  She minimized all her substance abuse.  There was no suicide  ideation.  No homicidal ideas.  There were no hallucinations or delusions.  She was willing to pursue abstinence through the [facility] program.   DISCHARGE DIAGNOSIS:   AXIS I:  1.  Depressive disorder, not otherwise  specified.  2.  Polysubstance abuse.   AXIS II:  No diagnosis.   AXIS III:  Migraine headaches.   AXIS IV:  Moderate.   AXIS V:  Global assessment of functioning on discharge:  55.   DISCHARGE MEDICATIONS:  1.  Tenormin 200 mg daily.  2.  Estrace 1 mg daily.  3.  Lexapro 20 mg daily.  4.  Klonopin 0.5 twice a day as needed with plans to decrease further and      eventually come off.  5.  Zanaflex 4 mg one every 8 hours as needed.   FOLLOW UP:  [facility].                                               Geoffery Lyons, M.D.    IL/MEDQ  D:  05/20/2004  T:  05/22/2004  Job:  [id number]

## 2011-02-05 NOTE — Assessment & Plan Note (Signed)
[name] returns to the clinic today for followup evaluation.  She  reports that overall she is doing fairly well on her morphine sulphate  immediate release up to 4 times per day.  She generally uses 3 to 4 tablets  per day.  She also uses her soma generally at bedtime and occasionally  through the day as needed.  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  She is continuing her education and finishing off  prerequisites for nursing school.  The patient reports that she occasionally  elevates her feet and that helps relieve her low back pain.  She complains  of tightness, especially in the low back in the evening.   MEDICATIONS:  1. Estrace 1 mg q. day.  2. Hydrochlorothiazide 25 mg q. day.  3. Morphine sulfate immediate release 15 mg 1 tablet q.i.d. p.r.n. (3 to 4      per day).  4. Soma 350 mg 1 tablet b.i.d. p.r.n.   REVIEW OF SYSTEMS:  Noncontributory.   PHYSICAL EXAM:  Well-appearing thin adult female in mild to no acute  discomfort.  Blood pressure 137/87, with a pulse of 92, respiratory rate 16, and O2  saturation 99% on room air.  She has 5-/5 strength symmetric bilateral upper and lower extremities.  The  bulk of them were normal.  Reflexes were 2+ and symmetrical.  Sensation was  intact to light touch throughout.  She ambulates with no assistive devices.   IMPRESSION:  1. Mid thoracic pain related to T7-T8 herniated nucleus pulposis without      neurological compromise.  2. Degenerative disk disease of the lumbar spine L3-L5.  3. Status post rotator cuff surgery of the right upper extremity with      subsequent re-injury.   In the office today we did refill the patient's soma as of today, along with  her morphine sulfate.  We will plan on seeing her in followup in  approximately 2 months' time with refills prior to that appointment as  needed.  She continues to get good analgesic effect with no signs of  diversion and no significant adverse side  effects.           ______________________________  Ellwood Dense, M.D.     DC/MedQ  D:  07/18/2006 09:18:42  T:  07/18/2006 15:42:18  Job #:  [id number]
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
## 2011-02-05 NOTE — Assessment & Plan Note (Signed)
MEDICAL RECORD NUMBER:  [id number].   She comes into the clinic today for followup evaluation. She actually has  several different names, some of which she prefers and some of which are  legal names. She reports that her official name is [name]. We  will plan on using that at least through this note.   The patient was started on a fentanyl patch at 50 mcg per hour, change  q.72h. during the last clinic visit July 09, 2005. She reports that she  is getting good relief with that. She still uses her morphine sulfate  immediate release 15 mg 1 tablet q.i.d. p.r.n. She does need a refill on  both of those in the office today. She also uses her Robaxin but has  sufficient supply at the present time.   MEDICATIONS:  1.  Atenolol 100 mg q.i.d.  2.  Estrace 1 mg daily.  3.  Morphine sulfate immediate relief 15 mg 1 tablet q.i.d. p.r.n.  4.  Fentanyl patch 50 mcg per hour, change q.72h.  5.  Robaxin 500 mg t.i.d. p.r.n.   PHYSICAL EXAMINATION:  Reasonably well appearing adult female in no acute  discomfort. Blood pressure 116/55 with a pulse of 68, respiratory rate 16,  and O2 saturation 98% on room air. She is reporting respiratory symptoms  related to a recent cold. She has 5-/5 strength through the bilateral upper  and lower extremities. Bulk and tone were normal, and reflexes were 2+ and  symmetrical.   IMPRESSION:  1.  Mid thoracic pain related to T7-8 herniated nucleus pulposus without      neurological compromise.  2.  Degenerative disk disease of the lumbar spine at L3-4 and L4-5.  3.  Status post rotator cuff surgery on the right with subsequent reinjury.   In the office today, we did refill the patient's morphine sulfate immediate  release and her fentanyl patch. She is doing reasonably well on the fentanyl  patch and that seems to work better for her compared to the morphine sulfate  continuous release. We will plan on seeing her in followup at approximately  two  months' time with refill of medications prior that appointment as  necessary. She has obtained some tape which seems to help her keep the patch  in place without having it peel off accidentally.           ______________________________  Ellwood Dense, M.D.     DC/MedQ  D:  08/06/2005 15:01:49  T:  08/08/2005 08:22:53  Job #:  [id number]

## 2011-02-05 NOTE — Assessment & Plan Note (Signed)
[name] returns to the clinic today for followup evaluation. She reports  that she underwent ganglion cyst removal from her left wrist with Dr. Ranell Patrick  approximately three weeks ago. She was given Percocet and reports that she  took them for about four days and then threw the prescription away. While  she was taking the Percocet, she took one MS Contin daily and then once she  threw the Percocet away, she started taking her MS Contin back three times  per day. She took her last dose this morning. She reports that she gets some  fair relief from the MS Contin. She apparently fell off a [facility] while  she was visiting her recently. Apparently she was taken to the emergency  room January 02, 2005 and given a shot of Dilaudid and Phenergan by Dr.  Jennette Kettle. No x-rays were reportedly taken.   The patient is apparently doing some shoulder therapy for planned  arthroscopic surgery to her right shoulder. She has some questions about the  pain that she gets during therapy and we discussed that in the office today.   MEDICATIONS:  1.  Atenolol 100 mg q.d.  2.  Estrace 1 mg q.d.  3.  MS Contin extended release 15 mg three times a day.  4.  Klonopin 0.5 mg p.r.n.  5.  Tylenol p.r.n.   PHYSICAL EXAMINATION:  GENERAL:  A well appearing adult female in no acute  distress.  VITAL SIGNS:  Blood pressure 115/87 with a pulse of 70, respiratory rate 18,  and O2 saturation 99% on room air.   She has a splint present in her left wrist and forearm region. She has 5-/5  strength throughout the bilateral upper and lower extremities. Range of  motion was slightly decreased in the right shoulder compared to the left.  She ambulates without any assistive device.   IMPRESSION:  1.  Mid thoracic pain related to T7-8 herniated nucleus pulposus without      neurological change.  2.  Reported degenerative disk disease of the lumbar spine at L4-5.   In the office today, we did refill her MS Contin 15 mg, 1  tablet p.o. t.i.d.  p.r.n.  Will plan on seeing the patient in followup in approximately one  months time. I have recommended that she continue with her range of motion  exercises that they are doing in therapy in anticipation of probable right  shoulder arthroscopic surgery with her orthopedist. Will plan on seeing her  in followup as noted above.      DC/MedQ  D:  01/07/2005 11:43:12  T:  01/07/2005 11:53:00  Job #:  [id number]
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
## 2011-02-05 NOTE — Consult Note (Signed)
[facility]  Patient:    [name], [name]                 MRN: [id number] Proc. Date: 11/11/00 Adm. Date:  [id number] Disc. Date: [id number] Attending:  Molpus, John L                          Consultation Report  HISTORY OF PRESENT ILLNESS:  The patient is a 45 year old right-handed white female, born [birth year], with a history of intractable headaches.  This patient has [facility] been [facility] and has found her way to [facility] for her further care of headache.  The patient has reports of multiple drug allergies including codeine and codeine derivatives, Phenergan, Compazine, nonsteroidal anti-inflammatory medication and DHE-45.  Patient has recently been placed on Depakote but claims she cannot take it because of nausea and vomiting. Patient has been seen in the emergency room on multiple occasions over the last two to three weeks.  Patient returned to the emergency room tonight once again claiming to have intractable headache but adds to the story that she has had multiple seizure events as well involving the left side.  Patient gives a history of left-sided numbness and weakness following these seizures.  Workup for seizures in the past has been unremarkable.  Patient has been seen by Dr. Gustavus Messing. [facility] for this recently.  PAST MEDICAL HISTORY 1. Intractable headaches. 2. Seizures versus pseudoseizures. 3. Psychiatric illness including depression, followed through    Dr. Madie Reno A. Lugo for this.  CURRENT MEDICATIONS 1. Depakote 500 mg at night. 2. Zanaflex 4 mg if needed for headache. 3. Zofran.  SOCIAL HISTORY:  Patient does not smoke or drink.  [place] and [place].  REVIEW OF SYSTEMS:  Review of systems is notable for no recent fever or chills.  Patient does note some neck stiffness and some slight shortness of breath,  occasional chest pains, nausea, vomiting, diarrhea, left-sided numbness and weakness following her "seizures."  Reported with seizures that she will have some jerking on the left side, drooling on the left side and some confusion with the events.  PHYSICAL EXAMINATION  VITALS:  Blood pressure is 120/73.  Heart rate 105.  Respiratory rate 24. Temperature:  Afebrile.  GENERAL:  This patient is a well-developed white female who is alert and cooperative at the time of examination.  HEENT:  Head is atraumatic.  Eyes:  Pupils are equal, round and reactive to light.  Disks are flat bilaterally.  NECK:  Supple.  No carotid bruits are noted.  RESPIRATORY:  Clear to auscultation and percussion.  CARDIOVASCULAR:  Examination reveals a regular rate and rhythm.  No obvious murmurs or rubs noted.  EXTREMITIES:  Without significant edema.  NEUROLOGIC:  Cranial nerves as above.  Facial symmetry is present.  Patients pupils are again equal, round and reactive to light.  Disks are flat.  Speech is well-enunciated.  No facial droop is seen.  Patient notes decreased pinprick sensation on the left face compared to the right, does not split the midline with vibratory sensation.  Patient claims that there is decreased pinprick sensation in the left arm and leg, decreased vibratory sensation of the left arm and leg, normal sensation on the right side of the body.  Deep tendon reflexes are symmetric and normal.  Patient gives give-away-type weakness of the left arm and leg  that is inconsistent.  Patient was not ambulated.  IMPRESSION 1. History of intractable headache. 2. Seizures versus pseudoseizures. 3. Hysterical left hemiparesis.  Patient objectively appears to have a normal neurologic examination tonight. Will initiate treatment for headache at this point.  PLAN 1. Patient is to have an IV load of Depacon 1 g now. 2. Normal saline 500 cc bolus. 3. Zofran 4 mg IV now. 4. The patient may  be discharged to home following the above dosing. DD:  11/11/00 TD:  11/14/00 Job: [id number]

## 2011-05-04 ENCOUNTER — Emergency Department (HOSPITAL_COMMUNITY): Payer: Self-pay

## 2011-05-04 ENCOUNTER — Emergency Department (HOSPITAL_COMMUNITY)
Admission: EM | Admit: 2011-05-04 | Discharge: 2011-05-04 | Disposition: A | Discharge status: Home / Self Care | Payer: Self-pay | Attending: Emergency Medicine | Admitting: Emergency Medicine

## 2011-05-04 DIAGNOSIS — R229 Localized swelling, mass and lump, unspecified: Secondary | ICD-10-CM | POA: Insufficient documentation

## 2011-05-04 DIAGNOSIS — W010XXA Fall on same level from slipping, tripping and stumbling without subsequent striking against object, initial encounter: Secondary | ICD-10-CM | POA: Insufficient documentation

## 2011-05-04 DIAGNOSIS — S93409A Sprain of unspecified ligament of unspecified ankle, initial encounter: Secondary | ICD-10-CM | POA: Insufficient documentation

## 2011-05-04 DIAGNOSIS — M25579 Pain in unspecified ankle and joints of unspecified foot: Secondary | ICD-10-CM | POA: Insufficient documentation

## 2011-05-04 DIAGNOSIS — Y9289 Other specified places as the place of occurrence of the external cause: Secondary | ICD-10-CM | POA: Insufficient documentation

## 2011-05-12 ENCOUNTER — Other Ambulatory Visit (HOSPITAL_COMMUNITY): Payer: Self-pay | Admitting: Specialist

## 2011-05-12 DIAGNOSIS — M25562 Pain in left knee: Secondary | ICD-10-CM

## 2011-05-17 ENCOUNTER — Inpatient Hospital Stay (HOSPITAL_COMMUNITY): Admission: RE | Admit: 2011-05-17 | Payer: Self-pay | Source: Ambulatory Visit

## 2011-05-28 ENCOUNTER — Emergency Department (HOSPITAL_COMMUNITY)
Admission: EM | Admit: 2011-05-28 | Discharge: 2011-05-28 | Disposition: A | Discharge status: Home / Self Care | Payer: Self-pay | Attending: Emergency Medicine | Admitting: Emergency Medicine

## 2011-05-28 DIAGNOSIS — B9789 Other viral agents as the cause of diseases classified elsewhere: Secondary | ICD-10-CM | POA: Insufficient documentation

## 2011-05-28 DIAGNOSIS — R51 Headache: Secondary | ICD-10-CM | POA: Insufficient documentation

## 2011-05-28 DIAGNOSIS — R112 Nausea with vomiting, unspecified: Secondary | ICD-10-CM | POA: Insufficient documentation

## 2011-05-28 DIAGNOSIS — IMO0001 Reserved for inherently not codable concepts without codable children: Secondary | ICD-10-CM | POA: Insufficient documentation

## 2011-05-28 LAB — POCT I-STAT, CHEM 8
BUN: 9 mg/dL (ref 6–23)
Creatinine, Ser: 0.7 mg/dL (ref 0.50–1.10)
Hemoglobin: 15.6 g/dL — ABNORMAL HIGH (ref 12.0–15.0)
Potassium: 5 mEq/L (ref 3.5–5.1)
Sodium: 138 mEq/L (ref 135–145)

## 2011-06-25 LAB — RAPID URINE DRUG SCREEN, HOSP PERFORMED
Amphetamines: NOT DETECTED
Barbiturates: NOT DETECTED
Cocaine: NOT DETECTED
Opiates: POSITIVE — AB
Tetrahydrocannabinol: NOT DETECTED

## 2011-06-25 LAB — DIFFERENTIAL
Eosinophils Relative: 2 % (ref 0–5)
Lymphocytes Relative: 49 % — ABNORMAL HIGH (ref 12–46)
Lymphs Abs: 4.6 10*3/uL — ABNORMAL HIGH (ref 0.7–4.0)
Monocytes Absolute: 0.6 10*3/uL (ref 0.1–1.0)

## 2011-06-25 LAB — URINALYSIS, ROUTINE W REFLEX MICROSCOPIC
Bilirubin Urine: NEGATIVE
Hgb urine dipstick: NEGATIVE
Ketones, ur: NEGATIVE mg/dL
Nitrite: NEGATIVE
pH: 6 (ref 5.0–8.0)

## 2011-06-25 LAB — CBC
MCHC: 33.5 g/dL (ref 30.0–36.0)
MCV: 95.1 fL (ref 78.0–100.0)
Platelets: 345 10*3/uL (ref 150–400)
RDW: 14.2 % (ref 11.5–15.5)
WBC: 9.4 10*3/uL (ref 4.0–10.5)

## 2011-06-25 LAB — COMPREHENSIVE METABOLIC PANEL
AST: 97 U/L — ABNORMAL HIGH (ref 0–37)
Albumin: 3.7 g/dL (ref 3.5–5.2)
Calcium: 9 mg/dL (ref 8.4–10.5)
Creatinine, Ser: 0.68 mg/dL (ref 0.4–1.2)
GFR calc Af Amer: >60 mL/min (ref >60)
Total Protein: 6.6 g/dL (ref 6.0–8.3)

## 2011-09-01 ENCOUNTER — Emergency Department (HOSPITAL_COMMUNITY)
Admission: EM | Admit: 2011-09-01 | Discharge: 2011-09-01 | Disposition: A | Discharge status: Home / Self Care | Payer: Self-pay | Attending: Emergency Medicine | Admitting: Emergency Medicine

## 2011-09-01 ENCOUNTER — Emergency Department (HOSPITAL_COMMUNITY): Payer: Self-pay

## 2011-09-01 ENCOUNTER — Encounter: Payer: Self-pay | Admitting: *Deleted

## 2011-09-01 DIAGNOSIS — W010XXA Fall on same level from slipping, tripping and stumbling without subsequent striking against object, initial encounter: Secondary | ICD-10-CM | POA: Insufficient documentation

## 2011-09-01 DIAGNOSIS — F17200 Nicotine dependence, unspecified, uncomplicated: Secondary | ICD-10-CM | POA: Insufficient documentation

## 2011-09-01 DIAGNOSIS — M7989 Other specified soft tissue disorders: Secondary | ICD-10-CM | POA: Insufficient documentation

## 2011-09-01 DIAGNOSIS — M25569 Pain in unspecified knee: Secondary | ICD-10-CM | POA: Insufficient documentation

## 2011-09-01 DIAGNOSIS — S8000XA Contusion of unspecified knee, initial encounter: Secondary | ICD-10-CM | POA: Insufficient documentation

## 2011-09-01 DIAGNOSIS — S8002XA Contusion of left knee, initial encounter: Secondary | ICD-10-CM

## 2011-09-01 HISTORY — DX: Migraine, unspecified, not intractable, without status migrainosus: G43.909

## 2011-09-01 MED ORDER — OXYCODONE-ACETAMINOPHEN 5-325 MG PO TABS
2.0000 | ORAL_TABLET | Freq: Once | ORAL | Status: AC
  Administered 2011-09-01: 2 via ORAL
  Filled 2011-09-01: qty 2

## 2011-09-01 MED ORDER — OXYCODONE-ACETAMINOPHEN 5-325 MG PO TABS: 1.0000 | ORAL_TABLET | ORAL | Status: AC | PRN

## 2011-09-01 NOTE — ED Notes (Signed)
Pt. Refused ace wrap and crutches, pt. States I have both of them at home

## 2011-09-01 NOTE — ED Notes (Signed)
Pt. Discharged to home via w/c pt. Alert and oriented, NAD noted

## 2011-09-01 NOTE — ED Provider Notes (Signed)
History     CSN: [id number] Arrival date & time: 09/01/2011  1:55 AM   First MD Initiated Contact with Patient 09/01/11 0217      Chief Complaint  Patient presents with  . Knee Pain    fell while walkign dogs, fell onto L knee    (Consider location/radiation/quality/duration/timing/severity/associated sxs/prior treatment) HPI Comments: 45 year old female with a history of left knee injury causing meniscal tear and resultant arthroscopic knee surgery 2 years ago who presents after a trip and fall this evening. This was acute in onset approximately 5 hours prior to arrival, occurred when she tripped over a uneven piece of concrete landing on her left knee in a flexed position. Pain was mild initially and was ambulatory with minimal difficulty but since that time has gradually worsened. There is no associated numbness or weakness. Symptoms are constant, not associated with swelling. She has tried Tylenol, tramadol, heat, ice, Ace wraps but still has pain.  The history is provided by the patient.    Past Medical History  Diagnosis Date  . Migraines   . Endometriosis     Past Surgical History  Procedure Date  . Knee surgery   . Abdominal surgery   . Cholecystectomy   . Appendectomy   . Abdominal hysterectomy     Family History  Problem Relation Age of Onset  . Hypertension Mother   . Hyperlipidemia Mother   . Heart failure Father   . Cancer Other     History  Substance Use Topics  . Smoking status: Current Everyday Smoker -- 0.5 packs/day    Types: Cigarettes  . Smokeless tobacco: Not on file  . Alcohol Use:      rare    OB History    Grav Para Term Preterm Abortions TAB SAB Ect Mult Living                  Review of Systems  Gastrointestinal: Negative for nausea and vomiting.  Musculoskeletal: Positive for joint swelling and gait problem. Negative for back pain.  Neurological: Negative for weakness and numbness.    Allergies  Aspirin; Doxycycline; Sulfa  antibiotics; Imitrex; Levaquin; Prednisone; Latex; and Nsaids  Home Medications   Current Outpatient Rx  Name Route Sig Dispense Refill  . TYLENOL PO Oral Take 1 tablet by mouth every 4 (four) hours as needed. For pain     . CALCIUM PO Oral Take 1 tablet by mouth daily.      Marland Kitchen CLONAZEPAM 0.5 MG PO TABS Oral Take 0.5-1.5 mg by mouth 2 (two) times daily as needed. For anxiety      . DIPHENHYDRAMINE HCL (SLEEP) 25 MG PO TABS Oral Take 25 mg by mouth at bedtime.      . MULTIVITAMINS PO TABS Oral Take 1 tablet by mouth daily.      Marland Kitchen OXCARBAZEPINE 150 MG PO TABS Oral Take 150 mg by mouth 2 (two) times daily.      . TRAMADOL HCL 50 MG PO TABS Oral Take 50 mg by mouth 4 (four) times daily. Maximum dose= 8 tablets per day     . OXYCODONE-ACETAMINOPHEN 5-325 MG PO TABS Oral Take 1 tablet by mouth every 4 (four) hours as needed for pain. May take 2 tablets PO q 6 hours for severe pain - Do not take with Tylenol as this tablet already contains tylenol 15 tablet 0    BP 154/102  Pulse 97  Temp(Src) 97.2 F (36.2 C) (Oral)  Resp 18  SpO2  97%  Physical Exam  Nursing note and vitals reviewed. Constitutional: She appears well-developed and well-nourished. No distress.  HENT:  Head: Normocephalic and atraumatic.  Eyes: Conjunctivae are normal. No scleral icterus.  Cardiovascular: Normal rate, regular rhythm and intact distal pulses.   Pulmonary/Chest: Effort normal and breath sounds normal.  Musculoskeletal: She exhibits tenderness ( Tender to palpation over the left knee including the patella, proximal tibia, distal femur, approximately 90 range of motion but is unable to totally straighten the knee nor flex past 90. She has no inherited stability but exam is limited second to pain). She exhibits no edema.       No obvious swelling of the joints or effusion. No pain at the hip or the ankle on the right  Neurological: She is alert.  Skin: Skin is warm and dry. No rash noted. She is not  diaphoretic.    ED Course  Procedures (including critical care time)  Labs Reviewed - No data to display Dg Knee Complete 4 Views Left  09/01/2011  *RADIOLOGY REPORT*  Clinical Data: The patient fell and landed on the left knee. Unable to completely straighten.  LEFT KNEE - COMPLETE 4+ VIEW  Comparison: 01/27/2011  Findings: Linear lucency projected over the tibia which is consistent with a fat plane.  This was also present on the previous study.  No evidence of acute fracture or subluxation of the left knee.  No focal bone lesion demonstrated.  No bone destruction. Bone cortex and trabecular architecture appear intact.  No significant effusion.  IMPRESSION: No acute bony abnormalities demonstrated.  No significant change since prior study.  Original Report Authenticated By: Marlon Pel, M.D.     1. Contusion of left knee       MDM  Neurologically intact, exam consistent with contusion but no effusion or fracture seen on x-ray or clinically. Percocet given for pain, rice therapy initiated in the emergency department, recommendations for close followup.        Vida Roller, MD 09/01/11 [phone]

## 2011-09-01 NOTE — ED Notes (Addendum)
Fell onto L knee while walking dogs, c/o L knee pain, nausea, pain radiates down Leg/shin (upper tib/fib), pt crying sitting in w/c, "unable to get comfortable", has tried ice, heat, tylenol & tramadol. Occurred around 2130. H/o surgery on L knee 2 yrs ago (Dr. Otelia Sergeant).

## 2012-05-18 ENCOUNTER — Encounter: Payer: Self-pay | Admitting: Family Medicine

## 2012-05-18 ENCOUNTER — Ambulatory Visit: Payer: Self-pay | Admitting: Family Medicine

## 2012-05-18 VITALS — BP 142/96 | HR 89 | Temp 98.6°F | Resp 16 | Ht 61.5 in | Wt 151.0 lb

## 2012-05-18 DIAGNOSIS — Z1231 Encounter for screening mammogram for malignant neoplasm of breast: Secondary | ICD-10-CM

## 2012-05-18 DIAGNOSIS — F32A Depression, unspecified: Secondary | ICD-10-CM

## 2012-05-18 DIAGNOSIS — F329 Major depressive disorder, single episode, unspecified: Secondary | ICD-10-CM

## 2012-05-18 DIAGNOSIS — G8929 Other chronic pain: Secondary | ICD-10-CM

## 2012-05-18 DIAGNOSIS — Z87898 Personal history of other specified conditions: Secondary | ICD-10-CM

## 2012-05-18 DIAGNOSIS — Z0289 Encounter for other administrative examinations: Secondary | ICD-10-CM

## 2012-05-18 DIAGNOSIS — E663 Overweight: Secondary | ICD-10-CM

## 2012-05-18 DIAGNOSIS — M549 Dorsalgia, unspecified: Secondary | ICD-10-CM

## 2012-05-18 LAB — POCT URINALYSIS DIPSTICK
Bilirubin, UA: NEGATIVE
Glucose, UA: NEGATIVE
Ketones, UA: NEGATIVE
Leukocytes, UA: NEGATIVE
pH, UA: 5.5

## 2012-05-18 NOTE — Patient Instructions (Addendum)

## 2012-05-18 NOTE — Progress Notes (Signed)
Subjective:    Patient ID: [name], female    DOB: Mar 04, 1966, 46 y.o.   MRN: [id number]  HPI  This 46 y.o. Cauc female is here for CPE (no PAP); she has a DMV form to be completed.  She has a hypoglycemic episode a few years ago while driving; there has been no recurrence.  She is S/P TAH for endometriosis and abnormal bleeding. Has not had PAP/pelvic since. Last  MMG was more than 5 years ago.   Pt takes Trileptal 150 mg 1 tablet bid prescribed by Dr. Dub Mikes. She also takes Tramadol and   Soma for chronic low back pain due to bulging disc at T7-T8.    Review of Systems  Constitutional: Negative for fever, chills, diaphoresis, appetite change and fatigue.       Temperature control problems  HENT: Negative.   Eyes: Negative.   Respiratory: Negative.   Cardiovascular: Negative.   Gastrointestinal: Negative.   Genitourinary: Negative.   Musculoskeletal: Negative.   Skin: Negative.   Neurological: Negative.   Hematological: Negative.   Psychiatric/Behavioral: Negative.        Pt in treatment with Dr.Lugo       Objective:   Physical Exam  Nursing note and vitals reviewed. Constitutional: She is oriented to person, place, and time. She appears well-developed and well-nourished. No distress.  HENT:  Head: Normocephalic and atraumatic.  Right Ear: Hearing, tympanic membrane, external ear and ear canal normal.  Left Ear: Hearing, tympanic membrane, external ear and ear canal normal.  Nose: No nasal deformity.  Mouth/Throat: Uvula is midline, oropharynx is clear and moist and mucous membranes are normal. No oral lesions. Normal dentition. No dental caries.  Eyes: Conjunctivae and EOM are normal. Pupils are equal, round, and reactive to light. No scleral icterus.  Neck: Normal range of motion. Neck supple. No thyromegaly present.  Cardiovascular: Normal rate, regular rhythm and intact distal pulses.  Exam reveals no gallop and no friction rub.   No murmur  heard. Pulmonary/Chest: Effort normal and breath sounds normal. No respiratory distress.  Abdominal: Soft. Bowel sounds are normal. She exhibits no mass. There is no hepatosplenomegaly. There is no tenderness. There is no guarding and no CVA tenderness.  Genitourinary:       Exam deferred.  Musculoskeletal: Normal range of motion. She exhibits no edema and no tenderness.  Lymphadenopathy:    She has no cervical adenopathy.  Neurological: She is alert and oriented to person, place, and time. She has normal reflexes. No cranial nerve deficit. She exhibits normal muscle tone. Coordination normal.  Skin: Skin is warm and dry. No erythema. No pallor.  Psychiatric: She has a normal mood and affect. Her behavior is normal. Judgment and thought content normal.       Slightly flat affect.    Results for orders placed in visit on 05/18/12  POCT URINALYSIS DIPSTICK      Component Value Range   Color, UA yellow     Clarity, UA clear     Glucose, UA neg     Bilirubin, UA neg     Ketones, UA neg     Spec Grav, UA >=1.030     Blood, UA trace     pH, UA 5.5     Protein, UA neg     Urobilinogen, UA 0.2     Nitrite, UA neg     Leukocytes, UA Negative          Assessment & Plan:   1.  Health examination of defined subpopulation  POCT urinalysis dipstick  2. Other screening mammogram  MM Digital Screening  3. Depression  Continue follow-up with Dr.Lugo  4. Overweight (BMI 25.0-29.9)

## 2012-05-25 DIAGNOSIS — Z87898 Personal history of other specified conditions: Secondary | ICD-10-CM | POA: Insufficient documentation

## 2012-05-25 DIAGNOSIS — M549 Dorsalgia, unspecified: Secondary | ICD-10-CM | POA: Insufficient documentation

## 2012-05-25 DIAGNOSIS — F32A Depression, unspecified: Secondary | ICD-10-CM | POA: Insufficient documentation

## 2012-05-25 DIAGNOSIS — E663 Overweight: Secondary | ICD-10-CM | POA: Insufficient documentation

## 2012-05-25 DIAGNOSIS — Z0000 Encounter for general adult medical examination without abnormal findings: Secondary | ICD-10-CM | POA: Insufficient documentation

## 2012-05-25 DIAGNOSIS — F329 Major depressive disorder, single episode, unspecified: Secondary | ICD-10-CM | POA: Insufficient documentation

## 2012-05-25 DIAGNOSIS — G8929 Other chronic pain: Secondary | ICD-10-CM | POA: Insufficient documentation

## 2013-08-26 ENCOUNTER — Encounter (HOSPITAL_COMMUNITY): Payer: Self-pay | Admitting: Emergency Medicine

## 2013-08-26 ENCOUNTER — Emergency Department (HOSPITAL_COMMUNITY)
Admission: EM | Admit: 2013-08-26 | Discharge: 2013-08-26 | Disposition: A | Discharge status: Home / Self Care | Payer: Self-pay | Attending: Emergency Medicine | Admitting: Emergency Medicine

## 2013-08-26 DIAGNOSIS — R61 Generalized hyperhidrosis: Secondary | ICD-10-CM | POA: Insufficient documentation

## 2013-08-26 DIAGNOSIS — H53149 Visual discomfort, unspecified: Secondary | ICD-10-CM | POA: Insufficient documentation

## 2013-08-26 DIAGNOSIS — Z8742 Personal history of other diseases of the female genital tract: Secondary | ICD-10-CM | POA: Insufficient documentation

## 2013-08-26 DIAGNOSIS — K529 Noninfective gastroenteritis and colitis, unspecified: Secondary | ICD-10-CM

## 2013-08-26 DIAGNOSIS — Z91040 Latex allergy status: Secondary | ICD-10-CM | POA: Insufficient documentation

## 2013-08-26 DIAGNOSIS — F17200 Nicotine dependence, unspecified, uncomplicated: Secondary | ICD-10-CM | POA: Insufficient documentation

## 2013-08-26 DIAGNOSIS — F3289 Other specified depressive episodes: Secondary | ICD-10-CM | POA: Insufficient documentation

## 2013-08-26 DIAGNOSIS — R51 Headache: Secondary | ICD-10-CM | POA: Insufficient documentation

## 2013-08-26 DIAGNOSIS — K5289 Other specified noninfective gastroenteritis and colitis: Secondary | ICD-10-CM | POA: Insufficient documentation

## 2013-08-26 DIAGNOSIS — Z79899 Other long term (current) drug therapy: Secondary | ICD-10-CM | POA: Insufficient documentation

## 2013-08-26 DIAGNOSIS — Z8679 Personal history of other diseases of the circulatory system: Secondary | ICD-10-CM | POA: Insufficient documentation

## 2013-08-26 DIAGNOSIS — F329 Major depressive disorder, single episode, unspecified: Secondary | ICD-10-CM | POA: Insufficient documentation

## 2013-08-26 DIAGNOSIS — R519 Headache, unspecified: Secondary | ICD-10-CM

## 2013-08-26 DIAGNOSIS — Z9089 Acquired absence of other organs: Secondary | ICD-10-CM | POA: Insufficient documentation

## 2013-08-26 LAB — COMPREHENSIVE METABOLIC PANEL
AST: 13 U/L (ref 0–37)
Alkaline Phosphatase: 99 U/L (ref 39–117)
CO2: 25 mEq/L (ref 19–32)
Chloride: 98 mEq/L (ref 96–112)
Creatinine, Ser: 0.7 mg/dL (ref 0.50–1.10)
GFR calc non Af Amer: >90 mL/min (ref >90)
Potassium: 3.4 mEq/L — ABNORMAL LOW (ref 3.5–5.1)
Total Bilirubin: 0.3 mg/dL (ref 0.3–1.2)

## 2013-08-26 LAB — CBC WITH DIFFERENTIAL/PLATELET
Basophils Absolute: 0 10*3/uL (ref 0.0–0.1)
Basophils Relative: 0 % (ref 0–1)
Eosinophils Absolute: 0 10*3/uL (ref 0.0–0.7)
Eosinophils Relative: 0 % (ref 0–5)
Lymphs Abs: 3.2 10*3/uL (ref 0.7–4.0)
MCH: 32.2 pg (ref 26.0–34.0)
MCHC: 35.6 g/dL (ref 30.0–36.0)
Neutrophils Relative %: 67 % (ref 43–77)
Platelets: 409 10*3/uL — ABNORMAL HIGH (ref 150–400)
RBC: 4.63 MIL/uL (ref 3.87–5.11)
RDW: 12.7 % (ref 11.5–15.5)

## 2013-08-26 LAB — URINALYSIS, ROUTINE W REFLEX MICROSCOPIC
Bilirubin Urine: NEGATIVE
Ketones, ur: 40 mg/dL — AB
Leukocytes, UA: NEGATIVE
Nitrite: NEGATIVE
Protein, ur: NEGATIVE mg/dL
Urobilinogen, UA: 0.2 mg/dL (ref 0.0–1.0)

## 2013-08-26 LAB — LIPASE, BLOOD: Lipase: 17 U/L (ref 11–59)

## 2013-08-26 MED ORDER — SODIUM CHLORIDE 0.9 % IV BOLUS (SEPSIS)
1000.0000 mL | Freq: Once | INTRAVENOUS | Status: AC
  Administered 2013-08-26: 1000 mL via INTRAVENOUS

## 2013-08-26 MED ORDER — MORPHINE SULFATE 4 MG/ML IJ SOLN
4.0000 mg | Freq: Once | INTRAMUSCULAR | Status: AC
  Administered 2013-08-26: 4 mg via INTRAMUSCULAR
  Filled 2013-08-26: qty 1

## 2013-08-26 MED ORDER — MORPHINE SULFATE 4 MG/ML IJ SOLN
4.0000 mg | Freq: Once | INTRAMUSCULAR | Status: DC
  Filled 2013-08-26: qty 1

## 2013-08-26 MED ORDER — ONDANSETRON HCL 4 MG/2ML IJ SOLN
4.0000 mg | Freq: Once | INTRAMUSCULAR | Status: DC
  Filled 2013-08-26: qty 2

## 2013-08-26 MED ORDER — SODIUM CHLORIDE 0.9 % IJ SOLN
INTRAMUSCULAR | Status: AC
  Filled 2013-08-26: qty 10

## 2013-08-26 MED ORDER — ONDANSETRON 4 MG PO TBDP: 4.0000 mg | ORAL_TABLET | Freq: Three times a day (TID) | ORAL | Status: DC | PRN

## 2013-08-26 MED ORDER — ONDANSETRON HCL 4 MG/2ML IJ SOLN
4.0000 mg | Freq: Once | INTRAMUSCULAR | Status: AC
  Administered 2013-08-26: 4 mg via INTRAVENOUS
  Filled 2013-08-26: qty 2

## 2013-08-26 MED ORDER — ONDANSETRON 4 MG PO TBDP
4.0000 mg | ORAL_TABLET | Freq: Once | ORAL | Status: AC
  Administered 2013-08-26: 4 mg via ORAL
  Filled 2013-08-26: qty 1

## 2013-08-26 MED ORDER — KETOROLAC TROMETHAMINE 30 MG/ML IJ SOLN
30.0000 mg | Freq: Once | INTRAMUSCULAR | Status: AC
  Administered 2013-08-26: 30 mg via INTRAVENOUS
  Filled 2013-08-26: qty 1

## 2013-08-26 NOTE — ED Notes (Signed)
This RN was unable to get IV access or draw labs.  Asked Consulting civil engineer to attempt.

## 2013-08-26 NOTE — ED Notes (Signed)
Patient has multiple complaints. Patient states symptoms of body aches, abdominal spasms, chills, N/V/D, headache, and sensitivity to light x 3 days. Patient states she has vomited multiple times in the past 24 hours and 12 diarrheal stools yesterday, but none today.

## 2013-08-26 NOTE — ED Notes (Signed)
Reminded pt that she needs to use the restroom for sample. Gave specimen cup to pt.

## 2013-08-26 NOTE — ED Notes (Signed)
PA made aware that we were unable to get an IV line.  IV team was called and sts that it's going to be "a while."  Asked for medications to be made PO and/or IM.

## 2013-08-26 NOTE — ED Provider Notes (Signed)
CSN: [id number]     Arrival date & time 08/26/13  1507 History   First MD Initiated Contact with Patient 08/26/13 1559     Chief Complaint  Patient presents with  . Emesis  . Diarrhea  . Generalized Body Aches   (Consider location/radiation/quality/duration/timing/severity/associated sxs/prior Treatment) HPI Comments: The patient is a 47 year-old female with a past medical history of migraines and depression, presenting the Emergency Department with a chief complaint of abdominal pain with vomiting and diarrhea for 3 days.  She reports a sudden onset of symptoms. She reports associated posterior headache and photophobia.  Reports subjective fever and cough.  Abdominal surgeries: Cholecystectomy, appendectomy.     Patient is a 47 y.o. female presenting with vomiting and diarrhea. The history is provided by the patient and medical records. No language interpreter was used.  Emesis Associated symptoms: diarrhea   Diarrhea Associated symptoms: vomiting     Past Medical History  Diagnosis Date  . Migraines   . Endometriosis    Past Surgical History  Procedure Laterality Date  . Knee surgery    . Abdominal surgery    . Cholecystectomy    . Appendectomy    . Abdominal hysterectomy    . Nasal sinus surgery    . Laproscopy     Family History  Problem Relation Age of Onset  . Hypertension Mother   . Hyperlipidemia Mother   . Heart failure Father   . Cancer Other    History  Substance Use Topics  . Smoking status: Current Every Day Smoker -- 0.25 packs/day    Types: Cigarettes  . Smokeless tobacco: Never Used  . Alcohol Use: No   OB History   Grav Para Term Preterm Abortions TAB SAB Ect Mult Living                 Review of Systems  Gastrointestinal: Positive for vomiting and diarrhea.    Allergies  Aspirin; Doxycycline; Sulfa antibiotics; Imitrex; Levofloxacin; Prednisone; Latex; and Nsaids  Home Medications   Current Outpatient Rx  Name  Route  Sig  Dispense   Refill  . carisoprodol (SOMA) 350 MG tablet   Oral   Take 350 mg by mouth 4 (four) times daily as needed for muscle spasms.          . multivitamin (THERAGRAN) per tablet   Oral   Take 1 tablet by mouth daily.           . traMADol (ULTRAM) 50 MG tablet   Oral   Take 50 mg by mouth 4 (four) times daily. Maximum dose= 8 tablets per day         . ondansetron (ZOFRAN ODT) 4 MG disintegrating tablet   Oral   Take 1 tablet (4 mg total) by mouth every 8 (eight) hours as needed for nausea or vomiting.   20 tablet   0    BP 147/72  Pulse 71  Temp(Src) 98.2 F (36.8 C) (Oral)  Resp 16  SpO2 92% Physical Exam  Nursing note and vitals reviewed. Constitutional: She is oriented to person, place, and time. Vital signs are normal. She appears well-developed and well-nourished. No distress.  HENT:  Head: Normocephalic and atraumatic.  Mouth/Throat: Mucous membranes are dry. No oropharyngeal exudate, posterior oropharyngeal edema or posterior oropharyngeal erythema.  Eyes: EOM are normal. Pupils are equal, round, and reactive to light. No scleral icterus.  Neck: Neck supple.  Cardiovascular: Normal rate, regular rhythm and normal heart sounds.   No  murmur heard. Pulmonary/Chest: Effort normal and breath sounds normal. She has no wheezes.  Abdominal: Soft. Bowel sounds are normal. There is no tenderness. There is no rebound and no guarding.  Musculoskeletal: Normal range of motion. She exhibits no edema.  Neurological: She is alert and oriented to person, place, and time.  Skin: Skin is warm. No rash noted. She is diaphoretic. No pallor.    ED Course  Procedures (including critical care time) Labs Review Labs Reviewed  COMPREHENSIVE METABOLIC PANEL - Abnormal; Notable for the following:    Potassium 3.4 (*)    Glucose, Bld 101 (*)    All other components within normal limits  CBC WITH DIFFERENTIAL - Abnormal; Notable for the following:    WBC 11.4 (*)    Platelets 409 (*)     All other components within normal limits  URINALYSIS, ROUTINE W REFLEX MICROSCOPIC - Abnormal; Notable for the following:    Hgb urine dipstick TRACE (*)    Ketones, ur 40 (*)    All other components within normal limits  LIPASE, BLOOD  URINE MICROSCOPIC-ADD ON   Imaging Review No results found.  EKG Interpretation   None       MDM   1. Gastroenteritis   2. Headache    Patient with n/v/d abdominal pain and headache for several days.  Labs sent, fluids, antinausea medication ordered.  Discussed pt history and condition with Dr. Jeraldine Loots. Reports nausea has improved, complains of abdominal pain and headache.  She is tolerating liquids in the ED. She reports symptoms have improved and is requesting to be discharged home at this time. Discussed lab results, imaging results, and treatment plan with the patient.  She reports understanding and no other concerns at this time.   Patient is stable for discharge at this time. Meds given in ED:  Medications  sodium chloride 0.9 % bolus 1,000 mL (0 mLs Intravenous Stopped 08/26/13 2112)  morphine 4 MG/ML injection 4 mg (4 mg Intramuscular Given 08/26/13 1815)  ondansetron (ZOFRAN-ODT) disintegrating tablet 4 mg (4 mg Oral Given 08/26/13 1814)  ondansetron (ZOFRAN) injection 4 mg (4 mg Intravenous Given 08/26/13 2001)  sodium chloride 0.9 % bolus 1,000 mL (1,000 mLs Intravenous New Bag/Given 08/26/13 2110)  ketorolac (TORADOL) 30 MG/ML injection 30 mg (30 mg Intravenous Given 08/26/13 2109)    Discharge Medication List as of 08/26/2013 10:07 PM    START taking these medications   Details  ondansetron (ZOFRAN ODT) 4 MG disintegrating tablet Take 1 tablet (4 mg total) by mouth every 8 (eight) hours as needed for nausea or vomiting., Starting 08/26/2013, Until Discontinued, Print             Clabe Seal, PA-C 08/29/13 1603

## 2013-08-26 NOTE — ED Notes (Signed)
Patient is alert and oriented x3.  She was given DC instructions and follow up visit instructions.  Patient gave verbal understanding. She was DC ambulatory under her own power to home.  V/S stable.  He was not showing any signs of distress on DC 

## 2013-08-29 NOTE — ED Provider Notes (Signed)
  Medical screening examination/treatment/procedure(s) were performed by non-physician practitioner and as supervising physician I was immediately available for consultation/collaboration.  EKG Interpretation   None          Gerhard Munch, MD 08/29/13 1734

## 2013-11-04 ENCOUNTER — Emergency Department (HOSPITAL_COMMUNITY)
Admission: EM | Admit: 2013-11-04 | Discharge: 2013-11-04 | Disposition: A | Discharge status: Home / Self Care | Payer: Self-pay | Attending: Emergency Medicine | Admitting: Emergency Medicine

## 2013-11-04 ENCOUNTER — Encounter (HOSPITAL_COMMUNITY): Payer: Self-pay | Admitting: Emergency Medicine

## 2013-11-04 ENCOUNTER — Emergency Department (HOSPITAL_COMMUNITY): Payer: Self-pay

## 2013-11-04 DIAGNOSIS — Y929 Unspecified place or not applicable: Secondary | ICD-10-CM | POA: Insufficient documentation

## 2013-11-04 DIAGNOSIS — S63501A Unspecified sprain of right wrist, initial encounter: Secondary | ICD-10-CM

## 2013-11-04 DIAGNOSIS — R296 Repeated falls: Secondary | ICD-10-CM | POA: Insufficient documentation

## 2013-11-04 DIAGNOSIS — S63601A Unspecified sprain of right thumb, initial encounter: Secondary | ICD-10-CM

## 2013-11-04 DIAGNOSIS — F17200 Nicotine dependence, unspecified, uncomplicated: Secondary | ICD-10-CM | POA: Insufficient documentation

## 2013-11-04 DIAGNOSIS — N390 Urinary tract infection, site not specified: Secondary | ICD-10-CM | POA: Insufficient documentation

## 2013-11-04 DIAGNOSIS — Z8669 Personal history of other diseases of the nervous system and sense organs: Secondary | ICD-10-CM | POA: Insufficient documentation

## 2013-11-04 DIAGNOSIS — G43909 Migraine, unspecified, not intractable, without status migrainosus: Secondary | ICD-10-CM | POA: Insufficient documentation

## 2013-11-04 DIAGNOSIS — Z91040 Latex allergy status: Secondary | ICD-10-CM | POA: Insufficient documentation

## 2013-11-04 DIAGNOSIS — S63509A Unspecified sprain of unspecified wrist, initial encounter: Secondary | ICD-10-CM | POA: Insufficient documentation

## 2013-11-04 DIAGNOSIS — Y9389 Activity, other specified: Secondary | ICD-10-CM | POA: Insufficient documentation

## 2013-11-04 DIAGNOSIS — X500XXA Overexertion from strenuous movement or load, initial encounter: Secondary | ICD-10-CM | POA: Insufficient documentation

## 2013-11-04 DIAGNOSIS — S6390XA Sprain of unspecified part of unspecified wrist and hand, initial encounter: Secondary | ICD-10-CM | POA: Insufficient documentation

## 2013-11-04 DIAGNOSIS — Z8742 Personal history of other diseases of the female genital tract: Secondary | ICD-10-CM | POA: Insufficient documentation

## 2013-11-04 DIAGNOSIS — Z79899 Other long term (current) drug therapy: Secondary | ICD-10-CM | POA: Insufficient documentation

## 2013-11-04 DIAGNOSIS — Z9889 Other specified postprocedural states: Secondary | ICD-10-CM | POA: Insufficient documentation

## 2013-11-04 LAB — URINALYSIS, ROUTINE W REFLEX MICROSCOPIC
BILIRUBIN URINE: NEGATIVE
Glucose, UA: NEGATIVE mg/dL
HGB URINE DIPSTICK: NEGATIVE
Ketones, ur: NEGATIVE mg/dL
Nitrite: POSITIVE — AB
PROTEIN: NEGATIVE mg/dL
Specific Gravity, Urine: 1.007 (ref 1.005–1.030)
Urobilinogen, UA: 0.2 mg/dL (ref 0.0–1.0)
pH: 7 (ref 5.0–8.0)

## 2013-11-04 LAB — URINE MICROSCOPIC-ADD ON

## 2013-11-04 MED ORDER — OXYCODONE-ACETAMINOPHEN 5-325 MG PO TABS
1.0000 | ORAL_TABLET | Freq: Once | ORAL | Status: AC
  Administered 2013-11-04: 1 via ORAL
  Filled 2013-11-04: qty 1

## 2013-11-04 MED ORDER — OXYCODONE-ACETAMINOPHEN 5-325 MG PO TABS: 1.0000 | ORAL_TABLET | Freq: Three times a day (TID) | ORAL | Status: DC | PRN

## 2013-11-04 MED ORDER — NITROFURANTOIN MONOHYD MACRO 100 MG PO CAPS: 100.0000 mg | ORAL_CAPSULE | Freq: Two times a day (BID) | ORAL | Status: DC

## 2013-11-04 NOTE — Discharge Instructions (Signed)
Please call your doctor for a followup appointment within 24-48 hours. When you talk to your doctor please let them know that you were seen in the emergency department and have them acquire all of your records so that they can discuss the findings with you and formulate a treatment plan to fully care for your new and ongoing problems. Please rest and stay hydrated.  Please keep splint dry and avoid the splint from getting wet Please avoid any physical or strenuous activity Please keep arm elevated, ice, and rest Please take pain medications as prescribed - while on pain medications there is to be no drinking alcohol, driving, operating any heavy machinery. If there is extra please dispose in a proper manner. Please do not take any extra Tylenol for this can lead to Tylenol overdose and liver issue.  Please take antibiotics as prescribed - for urinary tract infection  Please followup with primary care provider for urinary tract infection Please followup with hand specialist regarding hand injury Will need a repeat x-ray within 10-14 days of the right wrist for injury to be reassessed. Please continue to monitor symptoms closely and if symptoms are to worsen or change (fever greater than 101, chills, fall, injury, numbness, tingling, abdominal pain, fever, chills, nausea, vomiting, pain with urination) please report back to the ED immediately  Wrist Sprain with Rehab A sprain is an injury in which a ligament that maintains the proper alignment of a joint is partially or completely torn. The ligaments of the wrist are susceptible to sprains. Sprains are classified into three categories. Grade 1 sprains cause pain, but the tendon is not lengthened. Grade 2 sprains include a lengthened ligament because the ligament is stretched or partially ruptured. With grade 2 sprains there is still function, although the function may be diminished. Grade 3 sprains are characterized by a complete tear of the tendon or  muscle, and function is usually impaired. SYMPTOMS   Pain tenderness, inflammation, and/or bruising (contusion) of the injury.  A "pop" or tear felt and/or heard at the time of injury.  Decreased wrist function. CAUSES  A wrist sprain occurs when a force is placed on one or more ligaments that is greater than it/they can withstand. Common mechanisms of injury include:  Catching a ball with you hands.  Repetitive and/ or strenuous extension or flexion of the wrist. RISK INCREASES WITH:  Previous wrist injury.  Contact sports (boxing or wrestling).  Activities in which falling is common.  Poor strength and flexibility.  Improperly fitted or padded protective equipment. PREVENTION  Warm up and stretch properly before activity.  Allow for adequate recovery between workouts.  Maintain physical fitness:  Strength, flexibility, and endurance.  Cardiovascular fitness.  Protect the wrist joint by limiting its motion with the use of taping, braces, or splints.  Protect the wrist after injury for 6 to 12 months. PROGNOSIS  The prognosis for wrist sprains depends on the degree of injury. Grade 1 sprains require 2 to 6 weeks of treatment. Grade 2 sprains require 6 to 8 weeks of treatment, and grade 3 sprains require up to 12 weeks.  RELATED COMPLICATIONS   Prolonged healing time, if improperly treated or re-injured.  Recurrent symptoms that result in a chronic problem.  Injury to nearby structures (bone, cartilage, nerves, or tendons).  Arthritis of the wrist.  Inability to compete in athletics at a high level.  Wrist stiffness or weakness.  Progression to a complete rupture of the ligament. TREATMENT  Treatment initially involves  resting from any activities that aggravate the symptoms, and the use of ice and medications to help reduce pain and inflammation. Your caregiver may recommend immobilizing the wrist for a period of time in order to reduce stress on the ligament  and allow for healing. After immobilization it is important to perform strengthening and stretching exercises to help regain strength and a full range of motion. These exercises may be completed at home or with a therapist. Surgery is not usually required for wrist sprains, unless the ligament has been ruptured (grade 3 sprain). MEDICATION   If pain medication is necessary, then nonsteroidal anti-inflammatory medications, such as aspirin and ibuprofen, or other minor pain relievers, such as acetaminophen, are often recommended.  Do not take pain medication for 7 days before surgery.  Prescription pain relievers may be given if deemed necessary by your caregiver. Use only as directed and only as much as you need. HEAT AND COLD  Cold treatment (icing) relieves pain and reduces inflammation. Cold treatment should be applied for 10 to 15 minutes every 2 to 3 hours for inflammation and pain and immediately after any activity that aggravates your symptoms. Use ice packs or massage the area with a piece of ice (ice massage).  Heat treatment may be used prior to performing the stretching and strengthening activities prescribed by your caregiver, physical therapist, or athletic trainer. Use a heat pack or soak your injury in warm water. SEEK MEDICAL CARE IF:  Treatment seems to offer no benefit, or the condition worsens.  Any medications produce adverse side effects. EXERCISES RANGE OF MOTION (ROM) AND STRETCHING EXERCISES - Wrist Sprain  These exercises may help you when beginning to rehabilitate your injury. Your symptoms may resolve with or without further involvement from your physician, physical therapist or athletic trainer. While completing these exercises, remember:   Restoring tissue flexibility helps normal motion to return to the joints. This allows healthier, less painful movement and activity.  An effective stretch should be held for at least 30 seconds.  A stretch should never be  painful. You should only feel a gentle lengthening or release in the stretched tissue. RANGE OF MOTION  Wrist Flexion, Active-Assisted  Extend your right / left elbow with your fingers pointing down.*  Gently pull the back of your hand towards you until you feel a gentle stretch on the top of your forearm.  Hold this position for __________ seconds. Repeat __________ times. Complete this exercise __________ times per day.  *If directed by your physician, physical therapist or athletic trainer, complete this stretch with your elbow bent rather than extended. RANGE OF MOTION  Wrist Extension, Active-Assisted  Extend your right / left elbow and turn your palm upwards.*  Gently pull your palm/fingertips back so your wrist extends and your fingers point more toward the ground.  You should feel a gentle stretch on the inside of your forearm.  Hold this position for __________ seconds. Repeat __________ times. Complete this exercise __________ times per day. *If directed by your physician, physical therapist or athletic trainer, complete this stretch with your elbow bent, rather than extended. RANGE OF MOTION  Supination, Active  Stand or sit with your elbows at your side. Bend your right / left elbow to 90 degrees.  Turn your palm upward until you feel a gentle stretch on the inside of your forearm.  Hold this position for __________ seconds. Slowly release and return to the starting position. Repeat __________ times. Complete this stretch __________ times per day.  RANGE OF MOTION  Pronation, Active  Stand or sit with your elbows at your side. Bend your right / left elbow to 90 degrees.  Turn your palm downward until you feel a gentle stretch on the top of your forearm.  Hold this position for __________ seconds. Slowly release and return to the starting position. Repeat __________ times. Complete this stretch __________ times per day.  STRETCH - Wrist Flexion  Place the back of  your right / left hand on a tabletop leaving your elbow slightly bent. Your fingers should point away from your body.  Gently press the back of your hand down onto the table by straightening your elbow. You should feel a stretch on the top of your forearm.  Hold this position for __________ seconds. Repeat __________ times. Complete this stretch __________ times per day.  STRETCH  Wrist Extension  Place your right / left fingertips on a tabletop leaving your elbow slightly bent. Your fingers should point backwards.  Gently press your fingers and palm down onto the table by straightening your elbow. You should feel a stretch on the inside of your forearm.  Hold this position for __________ seconds. Repeat __________ times. Complete this stretch __________ times per day.  STRENGTHENING EXERCISES - Wrist Sprain These exercises may help you when beginning to rehabilitate your injury. They may resolve your symptoms with or without further involvement from your physician, physical therapist or athletic trainer. While completing these exercises, remember:   Muscles can gain both the endurance and the strength needed for everyday activities through controlled exercises.  Complete these exercises as instructed by your physician, physical therapist or athletic trainer. Progress with the resistance and repetition exercises only as your caregiver advises. STRENGTH Wrist Flexors  Sit with your right / left forearm palm-up and fully supported. Your elbow should be resting below the height of your shoulder. Allow your wrist to extend over the edge of the surface.  Loosely holding a __________ weight or a piece of rubber exercise band/tubing, slowly curl your hand up toward your forearm.  Hold this position for __________ seconds. Slowly lower the wrist back to the starting position in a controlled manner. Repeat __________ times. Complete this exercise __________ times per day.  STRENGTH  Wrist  Extensors  Sit with your right / left forearm palm-down and fully supported. Your elbow should be resting below the height of your shoulder. Allow your wrist to extend over the edge of the surface.  Loosely holding a __________ weight or a piece of rubber exercise band/tubing, slowly curl your hand up toward your forearm.  Hold this position for __________ seconds. Slowly lower the wrist back to the starting position in a controlled manner. Repeat __________ times. Complete this exercise __________ times per day.  STRENGTH - Ulnar Deviators  Stand with a ____________________ weight in your right / left hand, or sit holding on to the rubber exercise band/tubing with your opposite arm supported.  Move your wrist so that your pinkie travels toward your forearm and your thumb moves away from your forearm.  Hold this position for __________ seconds and then slowly lower the wrist back to the starting position. Repeat __________ times. Complete this exercise __________ times per day STRENGTH - Radial Deviators  Stand with a ____________________ weight in your  right / left hand, or sit holding on to the rubber exercise band/tubing with your arm supported.  Raise your hand upward in front of you or pull up on the rubber tubing.  Hold this position for __________ seconds and then slowly lower the wrist back to the starting position. Repeat __________ times. Complete this exercise __________ times per day. STRENGTH  Forearm Supinators  Sit with your right / left forearm supported on a table, keeping your elbow below shoulder height. Rest your hand over the edge, palm down.  Gently grip a hammer or a soup ladle.  Without moving your elbow, slowly turn your palm and hand upward to a "thumbs-up" position.  Hold this position for __________ seconds. Slowly return to the starting position. Repeat __________ times. Complete this exercise __________ times per day.  STRENGTH  Forearm  Pronators  Sit with your right / left forearm supported on a table, keeping your elbow below shoulder height. Rest your hand over the edge, palm up.  Gently grip a hammer or a soup ladle.  Without moving your elbow, slowly turn your palm and hand upward to a "thumbs-up" position.  Hold this position for __________ seconds. Slowly return to the starting position. Repeat __________ times. Complete this exercise __________ times per day.  STRENGTH - Grip  Grasp a tennis ball, a dense sponge, or a large, rolled sock in your hand.  Squeeze as hard as you can without increasing any pain.  Hold this position for __________ seconds. Release your grip slowly. Repeat __________ times. Complete this exercise __________ times per day.  Document Released: 09/06/2005 Document Revised: 11/29/2011 Document Reviewed: 12/19/2008 [facility] Patient Information 2014 [place], [place].  Urinary Tract Infection Urinary tract infections (UTIs) can develop anywhere along your urinary tract. Your urinary tract is your body's drainage system for removing wastes and extra water. Your urinary tract includes two kidneys, two ureters, a bladder, and a urethra. Your kidneys are a pair of bean-shaped organs. Each kidney is about the size of your fist. They are located below your ribs, one on each side of your spine. CAUSES Infections are caused by microbes, which are microscopic organisms, including fungi, viruses, and bacteria. These organisms are so small that they can only be seen through a microscope. Bacteria are the microbes that most commonly cause UTIs. SYMPTOMS  Symptoms of UTIs may vary by age and gender of the patient and by the location of the infection. Symptoms in young women typically include a frequent and intense urge to urinate and a painful, burning feeling in the bladder or urethra during urination. Older women and men are more likely to be tired, shaky, and weak and have muscle aches and abdominal pain. A  fever may mean the infection is in your kidneys. Other symptoms of a kidney infection include pain in your back or sides below the ribs, nausea, and vomiting. DIAGNOSIS To diagnose a UTI, your caregiver will ask you about your symptoms. Your caregiver also will ask to provide a urine sample. The urine sample will be tested for bacteria and white blood cells. White blood cells are made by your body to help fight infection. TREATMENT  Typically, UTIs can be treated with medication. Because most UTIs are caused by a bacterial infection, they usually can be treated with the use of antibiotics. The choice of antibiotic and length of treatment depend on your symptoms and the type of bacteria causing your infection. HOME CARE INSTRUCTIONS  If you were prescribed antibiotics, take them exactly as your caregiver instructs you. Finish the medication even if you feel better after you have only taken some of the medication.  Drink enough water and fluids to keep your urine clear or  pale yellow.  Avoid caffeine, tea, and carbonated beverages. They tend to irritate your bladder.  Empty your bladder often. Avoid holding urine for long periods of time.  Empty your bladder before and after sexual intercourse.  After a bowel movement, women should cleanse from front to back. Use each tissue only once. SEEK MEDICAL CARE IF:   You have back pain.  You develop a fever.  Your symptoms do not begin to resolve within 3 days. SEEK IMMEDIATE MEDICAL CARE IF:   You have severe back pain or lower abdominal pain.  You develop chills.  You have nausea or vomiting.  You have continued burning or discomfort with urination. MAKE SURE YOU:   Understand these instructions.  Will watch your condition.  Will get help right away if you are not doing well or get worse. Document Released: 06/16/2005 Document Revised: 03/07/2012 Document Reviewed: 10/15/2011 [facility] Patient Information 2014 [place],  [place].   Emergency Department Resource Guide 1) Find a Doctor and Pay Out of Pocket Although you won't have to find out who is covered by your insurance plan, it is a good idea to ask around and get recommendations. You will then need to call the office and see if the doctor you have chosen will accept you as a new patient and what types of options they offer for patients who are self-pay. Some doctors offer discounts or will set up payment plans for their patients who do not have insurance, but you will need to ask so you aren't surprised when you get to your appointment.  2) Contact Your Local Health Department Not all health departments have doctors that can see patients for sick visits, but many do, so it is worth a call to see if yours does. If you don't know where your local health department is, you can check in your phone book. The CDC also has a tool to help you locate your state's health department, and many state websites also have listings of all of their local health departments.  3) Find a Walk-in Clinic If your illness is not likely to be very severe or complicated, you may want to try a walk in clinic. These are popping up all over the country in pharmacies, drugstores, and shopping centers. They're usually staffed by nurse practitioners or physician assistants that have been trained to treat common illnesses and complaints. They're usually fairly quick and inexpensive. However, if you have serious medical issues or chronic medical problems, these are probably not your best option.  No Primary Care Doctor: - Call Health Connect at  [phone] - they can help you locate a primary care doctor that  accepts your insurance, provides certain services, etc. - Physician Referral Service- [phone]  Chronic Pain Problems: Organization         Address  Phone   Notes  [facility]  [phone] Patients need to be referred by their primary care doctor.   Medication  Assistance: Organization         Address  Phone   Notes  [facility] Medication [facility] [address] [place] [place], [place] [address] [phone] --Must be a resident of [facility] -- Must have NO insurance coverage whatsoever (no Medicaid/ Medicare, etc.) -- The pt. MUST have a primary care doctor that directs their care regularly and follows them in the community   [facility]  [phone]   [employer]  [phone]    Agencies that provide inexpensive  medical care: Organization         Address  Phone   Notes  [place]  [phone]   [facility]    [phone]   [facility] [place], [place] [address] [phone]   [place] [address], [place] [phone]   Planned Parenthood    [phone]   [facility]    [phone]   [place] [address], [place] Phone:  [phone], Fax:  [phone] Hours of Operation:  9 am - 6 pm, M-F.  Also accepts Medicaid/Medicare and self-pay.  [facility] for [place] [address], [place] Phone: [phone], Fax: [phone]. Hours of Operation:  8:30 am - 5:30 pm, M-F.  Also accepts Medicaid and self-pay.  [facility] [address] [place] Phone: [phone]   [place], [place], [place] [phone], Ext. [phone] Mondays & Thursdays: 7-9 AM.  First 15 patients are seen on a first come, first serve basis.    [place] Providers:  Organization         Address  Phone   Notes  [facility] [address],  [phone] Also accepts self-pay patients.  [facility] [address], [place]  [phone]   [place], [address], [place]  [phone]   [facility] [address], [place] [phone]   Lucianne Lei [address], [place]   [phone] Only accepts [place] Access [place] patients after they have their name applied to their card.   Self-Pay (no insurance) in [facility]:  Organization         Address  Phone   Notes  Sickle Cell Patients, [facility] [place] [phone]   [facility] [place] [phone]   [facility] [place]  [place], [place], [place] [phone]   [facility]/Dr. Osei-Bonsu  [address], [place] or [address], [place] [phone] Phone number for both [place] and [place] locations is the same.  [facility]gent [facility] and [facility] [address] [place] [phone]   [facility] [address] [place] or [address] [phone]   [facility] [address], [place] [phone], phone; [phone], fax Sees patients 1st and 3rd Saturday of every month.  Must not qualify for public or private insurance (i.e. Medicaid, Medicare, [place] Health Choice, Veterans' Benefits)  Household income should be no more than 200% of the poverty level The clinic cannot treat you if you are pregnant or think you are pregnant  Sexually transmitted diseases are not treated at the clinic.    Dental Care: Organization         Address  Phone  Notes  [facility] Department of [facility] [place] [phone] Accepts children up to age 51 who are enrolled in [place] or [place]; pregnant women with a Medicaid card; and children who have applied for Medicaid or [place] Health Choice, but were  declined, whose parents can pay a reduced fee at time of service.  [facility]  Department of [facility]  [address], [place] [phone] Accepts children up to age 20 who are enrolled in [place] or [place]; pregnant women with a Medicaid card; and children who have applied for Medicaid or [place] Health Choice, but were declined, whose parents can pay a reduced fee at time of service.  [facility]  [place] [phone] Patients are seen by appointment only. Walk-ins are not accepted. [place] will see patients 32 years of age and older. Monday - Tuesday (8am-5pm) Most Wednesdays (8:30-5pm) $30 per visit, cash only  [facility] Adult [facility] Access PRO[facility]  [address], [facility] [phone] Patients are seen by appointment only. Walk-ins are not accepted. [place] will see patients 69 years of age and older. One Wednesday Evening (Monthly: Volunteer Based).  $30 per visit, cash only  [place]  [phone] for adults; Children under age 75, call Graduate Pediatric Dentistry at [phone]. Children aged 9-14, please call [phone] to request a pediatric application.  Dental services are provided in all areas of dental care including fillings, crowns and bridges, complete and partial dentures, implants, gum treatment, root canals, and extractions. Preventive care is also provided. Treatment is provided to both adults and children. Patients are selected via a lottery and there is often a waiting list.   [facility] [address], [place]  [phone] [email or web address]   [facility] [address] [place], [place] [phone], Ext. [phone] Second and Fourth Thursday of each month, opens at 6:30 AM; Clinic ends at 9 AM.  Patients are seen on a first-come first-served basis, and a limited number are seen during each clinic.   [facility]  [address] [place], [place] [phone]    Eligibility Requirements You must have lived in [place], [place], or [place] counties for at least the last three months.   You cannot be eligible for state or federal sponsored [employer], including [employer], [place], or [employer].   You generally cannot be eligible for healthcare insurance through your employer.    How to apply: Eligibility screenings are held every Tuesday and Wednesday afternoon from 1:00 pm until 4:00 pm. You do not need an appointment for the interview!  [facility] [address] [place], [place]   [place]  [place]  [phone]    Behavioral Health Resources in the Community: Intensive Outpatient Programs Organization         Address  Phone  Notes  [place] [address], [place], [place] [phone]   [facility] Outpatient [address] [place], [place]   [facility]: [facility] [address] [place], [place]   [place] [address]  [place], [place] or [phone]   Substance Abuse Resources Organization         Address  Phone  Notes  [facility]  [phone]   [place]  [phone]   [facility] [place]   Chinita Pester  [phone]   Residential & Outpatient Substance Abuse Program  [phone]   Psychological Services Organization  Address  Phone  Notes  [place]  [phone]   [place] [facility] [address] [place] or [phone]    Mobile Crisis Teams Organization         Address  Phone  Notes  [facility], Mobile Crisis Care Unit  [phone]   [facility]  [address]  [place], [place]   [place] [address], [place] [phone]    Self-Help/Support Groups Organization         Address  Phone             Notes  [place] [place] - variety of support groups  [place] Call for more information  Narcotics Anonymous (NA), [facility] [address], [employer] [place]  2 meetings at this location   Special educational needs teacher         Address  Phone  Notes  [facility] [place],    [place]  [phone]   [facility]  [address], [place] [id number], [place], [place]   [place], [place] [phone] Admissions: 8am-3pm M-F  Incent[facility] Substance [place] [address]    [place], [place] [id number]   [facility] [address] [place], [place], [place]   [facility] [address]  [place], [place]   [facility] - Intensive [address], [place], [place]   [facility] ([place].,  [place], [place] [phone] or [phone]   [facility] ([facility]) [address] [place], [place] Accepts Medicaid  [facility] [address]  [place] [phone] Substance Abuse/Addiction Treatment   [facility] Organization         Address  Phone  Notes  [facility]  [phone]   Domenic Schwab, PhD [address] Arlis Porta [place], [place]   [phone] or [phone]   [place], [place] [phone]   [facility] [address], [place], [place] [phone] Insurance/Medicaid/sponsorship through [facility] and Families [address] [place]                                    [place], [place] [phone] [place] [address], [place] [phone]    Dr. Adele Schilder  [phone]   [facility] 1) [address], [place] 2) [place] 3)  [place] [address], [place] [phone]  [phone]   [place] [phone] or [phone] (After Hours)

## 2013-11-04 NOTE — ED Notes (Signed)
Pt from home c/o right thumb injury that occurred when she fell into a pot hole. She reports that her thumb bent backwards. She is having pain in the right thumb that radiates up the arm.

## 2013-11-04 NOTE — ED Provider Notes (Signed)
CSN: [id number]     Arrival date & time 11/04/13  1035 History   First MD Initiated Contact with Patient 11/04/13 1054     Chief Complaint  Patient presents with  . Finger Injury     (Consider location/radiation/quality/duration/timing/severity/associated sxs/prior Treatment) The history is provided by the patient. No language interpreter was used.  [name] is a 48 year old female with past medical history of migraines, endometriosis, carpal tunnel syndrome and surgery to the right side 3 years ago presenting to emergency department with right hand pain that started yesterday. Patient reported that she fell-reported that she stepped in a pothole resulting in her landing on her hands bilaterally in an outstretched manner. Reported that she's been experiencing right thumb pain, right wrist and right elbow pain. Reported that the pain is "intense"described as a sharp pain that radiates up to her wrist and right elbow. Stated that the pain worsens with motion. Stated that she took a Vicodin which did not help her pain. Denied fever, chills, previous injury. Patient requesting urinalysis secondary to being treated urinary tract infection. Patient reported that she cannot recall what antibiotic she used. Stated that she has continuous urinary frequency-reported that she knows that the urinary tract infection is not healed.  Past Medical History  Diagnosis Date  . Migraines   . Endometriosis    Past Surgical History  Procedure Laterality Date  . Knee surgery    . Abdominal surgery    . Cholecystectomy    . Appendectomy    . Abdominal hysterectomy    . Nasal sinus surgery    . Laproscopy     Family History  Problem Relation Age of Onset  . Hypertension Mother   . Hyperlipidemia Mother   . Heart failure Father   . Cancer Other    History  Substance Use Topics  . Smoking status: Current Every Day Smoker -- 0.25 packs/day    Types: Cigarettes  . Smokeless tobacco: Never Used  .  Alcohol Use: No   OB History   Grav Para Term Preterm Abortions TAB SAB Ect Mult Living                 Review of Systems  Constitutional: Negative for fever and chills.  Respiratory: Negative for chest tightness and shortness of breath.   Cardiovascular: Negative for chest pain.  Gastrointestinal: Negative for nausea, vomiting and abdominal pain.  Musculoskeletal: Positive for arthralgias (Right thumb, right wrist, right elbow). Negative for myalgias and neck pain.  Neurological: Positive for numbness (Right thumb). Negative for dizziness and weakness.  All other systems reviewed and are negative.      Allergies  Aspirin; Doxycycline; Sulfa antibiotics; Imitrex; Iodinated diagnostic agents; Levofloxacin; Prednisone; Latex; and Nsaids  Home Medications   Current Outpatient Rx  Name  Route  Sig  Dispense  Refill  . calcium-vitamin D (OSCAL WITH D) 500-200 MG-UNIT per tablet   Oral   Take 1 tablet by mouth daily with breakfast.         . carisoprodol (SOMA) 350 MG tablet   Oral   Take 350 mg by mouth 4 (four) times daily as needed for muscle spasms.          . clonazePAM (KLONOPIN) 1 MG tablet   Oral   Take 1 mg by mouth at bedtime as needed for anxiety (sleep).         Marland [place] HYDROcodone-acetaminophen (NORCO/VICODIN) 5-325 MG per tablet   Oral   Take 1 tablet by  mouth every 4 (four) hours as needed for moderate pain.         . multivitamin (THERAGRAN) per tablet   Oral   Take 1 tablet by mouth daily.           . traMADol (ULTRAM) 50 MG tablet   Oral   Take 50-100 mg by mouth 4 (four) times daily. Maximum dose= 8 tablets per day         . nitrofurantoin, macrocrystal-monohydrate, (MACROBID) 100 MG capsule   Oral   Take 1 capsule (100 mg total) by mouth 2 (two) times daily.   14 capsule   0   . oxyCODONE-acetaminophen (PERCOCET/ROXICET) 5-325 MG per tablet   Oral   Take 1 tablet by mouth every 8 (eight) hours as needed for severe pain.   5 tablet   0     BP 141/82  Pulse 91  Resp 16  Wt 130 lb (58.968 kg)  SpO2 100% Physical Exam  Nursing note and vitals reviewed. Constitutional: She is oriented to person, place, and time. She appears well-developed and well-nourished. No distress.  HENT:  Head: Normocephalic and atraumatic.  Eyes: Conjunctivae and EOM are normal. Right eye exhibits no discharge. Left eye exhibits no discharge.  Neck: Normal range of motion. Neck supple.  Cardiovascular: Normal rate, regular rhythm and normal heart sounds.  Exam reveals no friction rub.   No murmur heard. Pulses:      Radial pulses are 2+ on the right side, and 2+ on the left side.  Cap refill less than 3 seconds  Pulmonary/Chest: Effort normal and breath sounds normal. No respiratory distress. She has no wheezes. She has no rales.  Musculoskeletal: She exhibits tenderness.       Arms: Negative swelling, erythema, inflammation, lesions, sores noted to the right upper extremity. Negative deformities noted. Discomfort upon palpation to the right thumb circumferentially. Decreased range of motion to the right thumb secondary to pain. Full range of motion to remaining digits. Decreased range of motion to the wrist secondary to pain. Full pronation and supination noted. Full flexion extension noted to the right elbow. Discomfort upon palpation to the lateral epicondyle. Positive snuffbox tenderness.  Neurological: She is alert and oriented to person, place, and time. She exhibits normal muscle tone. Coordination normal.  Cranial nerves III-XII grossly intact Strength 5+/5+ to upper and lower extremities bilaterally with resistance applied, equal distribution noted Sensation intact with differentiation sharp and dull touch Gait proper, proper balance - negative sway, negative drift, negative step-offs  Skin: Skin is warm and dry. No rash noted. She is not diaphoretic. No erythema.  Psychiatric: She has a normal mood and affect. Her behavior is normal.  Thought content normal.    ED Course  Procedures (including critical care time)  1:09 PM This provider spoke with Dr. Lindajo Royal regarding case and how patient was not happy. Patient requesting further imaging to rule out nerve damage.   1:14 PM Attending in room assessing patient. Patient was seen and assessed by Dr. Lindajo Royal who recommended to place patient on Macrobid for UTI. Physician cleared patient for discharge.  Patient reported that Vicodin does not help her - reported that Percocets are better, stated that she has taken these before.   Results for orders placed during the hospital encounter of 11/04/13  URINALYSIS, ROUTINE W REFLEX MICROSCOPIC      Result Value Ref Range   Color, Urine YELLOW  YELLOW   APPearance CLOUDY (*) CLEAR   Specific  Gravity, Urine 1.007  1.005 - 1.030   pH 7.0  5.0 - 8.0   Glucose, UA NEGATIVE  NEGATIVE mg/dL   Hgb urine dipstick NEGATIVE  NEGATIVE   Bilirubin Urine NEGATIVE  NEGATIVE   Ketones, ur NEGATIVE  NEGATIVE mg/dL   Protein, ur NEGATIVE  NEGATIVE mg/dL   Urobilinogen, UA 0.2  0.0 - 1.0 mg/dL   Nitrite POSITIVE (*) NEGATIVE   Leukocytes, UA MODERATE (*) NEGATIVE  URINE MICROSCOPIC-ADD ON      Result Value Ref Range   Squamous Epithelial / LPF RARE  RARE   WBC, UA 11-20  <3 WBC/hpf   Bacteria, UA MANY (*) RARE   Dg Elbow Complete Right  11/04/2013   CLINICAL DATA:  Pain post fall.  EXAM: RIGHT ELBOW - COMPLETE 3+ VIEW  COMPARISON:  None.  FINDINGS: There is no evidence of fracture, dislocation, or joint effusion. There is no evidence of arthropathy or other focal bone abnormality. Soft tissues are unremarkable.  IMPRESSION: Negative.   Electronically Signed   By: Marin Olp M.D.   On: 11/04/2013 12:34   Dg Wrist Complete Right  11/04/2013   CLINICAL DATA:  Post fall, now with pain involving the first MCP joint  EXAM: RIGHT WRIST - COMPLETE 3+ VIEW  COMPARISON:  DG HAND COMPLETE*R* dated 11/04/2013  FINDINGS: No fracture or dislocation.  Joint spaces are preserved. No erosions. No evidence of chondrocalcinosis. No definite displacement of the pronator quadratus fat pad. Regional soft tissues are normal. No radiopaque foreign body.  IMPRESSION: No fracture. If the patient has pain referable to the anatomic snuff box, splinting and a follow-up radiograph in 10 to 14 days is recommended to evaluate for occult scaphoid fracture.   Electronically Signed   By: Sandi Mariscal M.D.   On: 11/04/2013 11:37   Dg Hand Complete Right  11/04/2013   CLINICAL DATA:  Post fall, now with pain involving the first MCP  EXAM: RIGHT HAND - COMPLETE 3+ VIEW  COMPARISON:  None.  FINDINGS: No fracture or dislocation special attention paid to the first MCP joint. Joint spaces are preserved. No definite erosions. No evidence of chondrocalcinosis. Regional soft tissues are normal. No radiopaque foreign body.  IMPRESSION: No fracture or dislocation.   Electronically Signed   By: Sandi Mariscal M.D.   On: 11/04/2013 11:34   Labs Review Labs Reviewed  URINALYSIS, ROUTINE W REFLEX MICROSCOPIC - Abnormal; Notable for the following:    APPearance CLOUDY (*)    Nitrite POSITIVE (*)    Leukocytes, UA MODERATE (*)    All other components within normal limits  URINE MICROSCOPIC-ADD ON - Abnormal; Notable for the following:    Bacteria, UA MANY (*)    All other components within normal limits   Imaging Review Dg Elbow Complete Right  11/04/2013   CLINICAL DATA:  Pain post fall.  EXAM: RIGHT ELBOW - COMPLETE 3+ VIEW  COMPARISON:  None.  FINDINGS: There is no evidence of fracture, dislocation, or joint effusion. There is no evidence of arthropathy or other focal bone abnormality. Soft tissues are unremarkable.  IMPRESSION: Negative.   Electronically Signed   By: Marin Olp M.D.   On: 11/04/2013 12:34   Dg Wrist Complete Right  11/04/2013   CLINICAL DATA:  Post fall, now with pain involving the first MCP joint  EXAM: RIGHT WRIST - COMPLETE 3+ VIEW  COMPARISON:  DG HAND  COMPLETE*R* dated 11/04/2013  FINDINGS: No fracture or dislocation. Joint spaces are preserved. No  erosions. No evidence of chondrocalcinosis. No definite displacement of the pronator quadratus fat pad. Regional soft tissues are normal. No radiopaque foreign body.  IMPRESSION: No fracture. If the patient has pain referable to the anatomic snuff box, splinting and a follow-up radiograph in 10 to 14 days is recommended to evaluate for occult scaphoid fracture.   Electronically Signed   By: Sandi Mariscal M.D.   On: 11/04/2013 11:37   Dg Hand Complete Right  11/04/2013   CLINICAL DATA:  Post fall, now with pain involving the first MCP  EXAM: RIGHT HAND - COMPLETE 3+ VIEW  COMPARISON:  None.  FINDINGS: No fracture or dislocation special attention paid to the first MCP joint. Joint spaces are preserved. No definite erosions. No evidence of chondrocalcinosis. Regional soft tissues are normal. No radiopaque foreign body.  IMPRESSION: No fracture or dislocation.   Electronically Signed   By: Sandi Mariscal M.D.   On: 11/04/2013 11:34    EKG Interpretation   None       MDM   Final diagnoses:  Sprain of right thumb  Right wrist sprain  UTI (urinary tract infection)   Medications  oxyCODONE-acetaminophen (PERCOCET/ROXICET) 5-325 MG per tablet 1 tablet (1 tablet Oral Given 11/04/13 1257)    Filed Vitals:   11/04/13 1044  BP: 141/82  Pulse: 91  Resp: 16  Weight: 130 lb (58.968 kg)  SpO2: 100%    Patient presenting to emergency department with right thumb, right wrist right elbow pain that started last night while the patient fell and caught herself, landed on her hands bilaterally in an outstretched manner when she stepped in a pothole. Reported that she's been having sharp pain radiating up to her wrist and right elbow. Reported pain is worse with motion. Reported that she took Vicodin with minimal relief. Patient requesting urinalysis secondary to being treated with urinary tract infection-patient  reports urinary frequency, reports that the urinary tract infection is not cured. Alert and oriented. GCS 15. Negative facial trauma noted. Lungs clear to auscultation. Heart rate and rhythm normal. Radial pulses 2+ bilaterally. Negative swelling, erythema, formation, lesions, sores, deformities noted to the right upper extremity. Discomfort upon palpation to the right common wrist circumferentially. Discomfort upon palpation to the lateral epicondyles of the right elbow. Full flexion extension of the elbow noted. Proper supination and pronation. Decreased range of motion to the wrist and right thumb secondary to pain. Patient is able to make a fist. Patient is able to abduct and adduct the fingers without difficulty. Strength intact to MCP, PIP, DIP joints-mild decrease strength to the right thumb secondary to pain. Sensation intact with differentiation sharp and dull touch. Positive snuffbox tenderness. Right hand plain film no fracture noted. Right plain film no fracture noted. Right elbow negative for acute fracture. Urinalysis noted positive nitrites, moderate leukocytes with many bacteria-pyuria noted with 11-20. Attending physician saw and assess patient. Patient stable, afebrile. Patient neurovascularly intact Negative findings for fracture. Due to snuffbox tenderness will place patient in thumb spica splint for comfort. Patient has a urinary tract infection will discharge patient with antibiotics. Discharged patient with antibiotics and small dose of pain medication discussed course, cautions, disposal technique. Discussed with patient to rest, ice, elevate. Discussed with patient to avoid any physical or shortness activity. Discussed with patient that plain film of right wrist and hand will need to be repeated within 10-14 days. Discussed with patient that urine will need to be rechecked by her primary care provider. Discussed with patient to  closely monitor symptoms and if symptoms are to worsen or  change to report back to the ED - strict return instructions given.  Patient agreed to plan of care, understood, all questions answered.    Jamse Mead, PA-C 11/05/13 1135  Hailey Murrillo, PA-C 11/05/13 1142

## 2013-11-06 NOTE — ED Provider Notes (Signed)
Medical screening examination/treatment/procedure(s) were conducted as a shared visit with non-physician practitioner(s) and myself.  I personally evaluated the patient during the encounter.  EKG Interpretation   None      Patient seen by me. Patient extended right since last night. X-rays of right elbow, right wrist, right hand all negative for fracture. Immobilized. Referral to orthopedics. Treat for urinary tract infection  Nat Christen, MD 11/06/13 [phone]

## 2014-03-18 ENCOUNTER — Emergency Department (HOSPITAL_COMMUNITY): Payer: 59

## 2014-03-18 ENCOUNTER — Encounter (HOSPITAL_COMMUNITY): Payer: Self-pay | Admitting: Emergency Medicine

## 2014-03-18 ENCOUNTER — Observation Stay (HOSPITAL_COMMUNITY)
Admission: EM | Admit: 2014-03-18 | Discharge: 2014-03-20 | Disposition: A | Discharge status: Home / Self Care | Payer: 59 | Attending: Internal Medicine | Admitting: Internal Medicine

## 2014-03-18 DIAGNOSIS — F40298 Other specified phobia: Secondary | ICD-10-CM | POA: Insufficient documentation

## 2014-03-18 DIAGNOSIS — R531 Weakness: Secondary | ICD-10-CM

## 2014-03-18 DIAGNOSIS — M549 Dorsalgia, unspecified: Secondary | ICD-10-CM | POA: Insufficient documentation

## 2014-03-18 DIAGNOSIS — F3289 Other specified depressive episodes: Secondary | ICD-10-CM | POA: Insufficient documentation

## 2014-03-18 DIAGNOSIS — R404 Transient alteration of awareness: Secondary | ICD-10-CM

## 2014-03-18 DIAGNOSIS — F411 Generalized anxiety disorder: Secondary | ICD-10-CM

## 2014-03-18 DIAGNOSIS — F1011 Alcohol abuse, in remission: Secondary | ICD-10-CM | POA: Insufficient documentation

## 2014-03-18 DIAGNOSIS — R4182 Altered mental status, unspecified: Secondary | ICD-10-CM | POA: Insufficient documentation

## 2014-03-18 DIAGNOSIS — F29 Unspecified psychosis not due to a substance or known physiological condition: Secondary | ICD-10-CM | POA: Insufficient documentation

## 2014-03-18 DIAGNOSIS — F329 Major depressive disorder, single episode, unspecified: Secondary | ICD-10-CM | POA: Insufficient documentation

## 2014-03-18 DIAGNOSIS — G8929 Other chronic pain: Secondary | ICD-10-CM | POA: Diagnosis present

## 2014-03-18 DIAGNOSIS — R569 Unspecified convulsions: Principal | ICD-10-CM | POA: Diagnosis present

## 2014-03-18 DIAGNOSIS — F17200 Nicotine dependence, unspecified, uncomplicated: Secondary | ICD-10-CM | POA: Insufficient documentation

## 2014-03-18 DIAGNOSIS — G43909 Migraine, unspecified, not intractable, without status migrainosus: Secondary | ICD-10-CM | POA: Insufficient documentation

## 2014-03-18 DIAGNOSIS — Z0000 Encounter for general adult medical examination without abnormal findings: Secondary | ICD-10-CM

## 2014-03-18 DIAGNOSIS — F32A Depression, unspecified: Secondary | ICD-10-CM | POA: Diagnosis present

## 2014-03-18 DIAGNOSIS — E663 Overweight: Secondary | ICD-10-CM

## 2014-03-18 DIAGNOSIS — R29898 Other symptoms and signs involving the musculoskeletal system: Secondary | ICD-10-CM | POA: Insufficient documentation

## 2014-03-18 HISTORY — DX: Depression, unspecified: F32.A

## 2014-03-18 HISTORY — DX: Alcohol abuse, uncomplicated: F10.10

## 2014-03-18 HISTORY — DX: Anxiety disorder, unspecified: F41.9

## 2014-03-18 HISTORY — DX: Anorexia: R63.0

## 2014-03-18 HISTORY — DX: Major depressive disorder, single episode, unspecified: F32.9

## 2014-03-18 LAB — CBC WITH DIFFERENTIAL/PLATELET
Basophils Absolute: 0 10*3/uL (ref 0.0–0.1)
Basophils Relative: 0 % (ref 0–1)
Eosinophils Absolute: 0 10*3/uL (ref 0.0–0.7)
Eosinophils Relative: 0 % (ref 0–5)
HCT: 43.1 % (ref 36.0–46.0)
HEMOGLOBIN: 14.5 g/dL (ref 12.0–15.0)
LYMPHS ABS: 1.6 10*3/uL (ref 0.7–4.0)
LYMPHS PCT: 11 % — AB (ref 12–46)
MCH: 32 pg (ref 26.0–34.0)
MCHC: 33.6 g/dL (ref 30.0–36.0)
MCV: 95.1 fL (ref 78.0–100.0)
Monocytes Absolute: 0.5 10*3/uL (ref 0.1–1.0)
Monocytes Relative: 4 % (ref 3–12)
Neutro Abs: 13.1 10*3/uL — ABNORMAL HIGH (ref 1.7–7.7)
Neutrophils Relative %: 85 % — ABNORMAL HIGH (ref 43–77)
PLATELETS: 337 10*3/uL (ref 150–400)
RBC: 4.53 MIL/uL (ref 3.87–5.11)
RDW: 12.5 % (ref 11.5–15.5)
WBC: 15.3 10*3/uL — ABNORMAL HIGH (ref 4.0–10.5)

## 2014-03-18 LAB — COMPREHENSIVE METABOLIC PANEL
ALK PHOS: 76 U/L (ref 39–117)
ALT: 11 U/L (ref 0–35)
AST: 15 U/L (ref 0–37)
Albumin: 4.7 g/dL (ref 3.5–5.2)
BILIRUBIN TOTAL: 0.2 mg/dL — AB (ref 0.3–1.2)
BUN: 12 mg/dL (ref 6–23)
CHLORIDE: 95 meq/L — AB (ref 96–112)
CO2: 25 mEq/L (ref 19–32)
Calcium: 9.8 mg/dL (ref 8.4–10.5)
Creatinine, Ser: 0.8 mg/dL (ref 0.50–1.10)
GFR, EST NON AFRICAN AMERICAN: 86 mL/min — AB (ref >90)
GLUCOSE: 131 mg/dL — AB (ref 70–99)
POTASSIUM: 3.8 meq/L (ref 3.7–5.3)
SODIUM: 137 meq/L (ref 137–147)
Total Protein: 7.8 g/dL (ref 6.0–8.3)

## 2014-03-18 LAB — RAPID URINE DRUG SCREEN, HOSP PERFORMED
Amphetamines: NOT DETECTED
BARBITURATES: NOT DETECTED
Benzodiazepines: NOT DETECTED
Cocaine: NOT DETECTED
Opiates: NOT DETECTED
TETRAHYDROCANNABINOL: NOT DETECTED

## 2014-03-18 LAB — URINALYSIS, ROUTINE W REFLEX MICROSCOPIC
BILIRUBIN URINE: NEGATIVE
GLUCOSE, UA: NEGATIVE mg/dL
HGB URINE DIPSTICK: NEGATIVE
KETONES UR: 15 mg/dL — AB
Leukocytes, UA: NEGATIVE
NITRITE: NEGATIVE
PH: 5.5 (ref 5.0–8.0)
Protein, ur: NEGATIVE mg/dL
SPECIFIC GRAVITY, URINE: 1.028 (ref 1.005–1.030)
Urobilinogen, UA: 0.2 mg/dL (ref 0.0–1.0)

## 2014-03-18 LAB — ETHANOL

## 2014-03-18 LAB — MAGNESIUM: Magnesium: 2.2 mg/dL (ref 1.5–2.5)

## 2014-03-18 LAB — ACETAMINOPHEN LEVEL

## 2014-03-18 LAB — PHOSPHORUS: Phosphorus: 3.8 mg/dL (ref 2.3–4.6)

## 2014-03-18 LAB — SALICYLATE LEVEL

## 2014-03-18 MED ORDER — SUCCINYLCHOLINE CHLORIDE 20 MG/ML IJ SOLN
INTRAMUSCULAR | Status: AC
  Filled 2014-03-18: qty 1

## 2014-03-18 MED ORDER — ROCURONIUM BROMIDE 50 MG/5ML IV SOLN
INTRAVENOUS | Status: AC
  Filled 2014-03-18: qty 2

## 2014-03-18 MED ORDER — LIDOCAINE HCL (CARDIAC) 20 MG/ML IV SOLN
INTRAVENOUS | Status: DC
Start: 2014-03-18 — End: 2014-03-18
  Filled 2014-03-18: qty 5

## 2014-03-18 MED ORDER — SODIUM CHLORIDE 0.9 % IV SOLN
1000.0000 mg | INTRAVENOUS | Status: AC
  Administered 2014-03-18: 1000 mg via INTRAVENOUS
  Filled 2014-03-18: qty 10

## 2014-03-18 MED ORDER — LORAZEPAM 2 MG/ML IJ SOLN
INTRAMUSCULAR | Status: AC
  Filled 2014-03-18: qty 1

## 2014-03-18 MED ORDER — MORPHINE SULFATE 4 MG/ML IJ SOLN
4.0000 mg | Freq: Once | INTRAMUSCULAR | Status: AC
  Administered 2014-03-18: 4 mg via INTRAVENOUS
  Filled 2014-03-18: qty 1

## 2014-03-18 MED ORDER — SODIUM CHLORIDE 0.9 % IV BOLUS (SEPSIS)
1000.0000 mL | Freq: Once | INTRAVENOUS | Status: AC
  Administered 2014-03-18: 1000 mL via INTRAVENOUS

## 2014-03-18 MED ORDER — ETOMIDATE 2 MG/ML IV SOLN
INTRAVENOUS | Status: AC
  Filled 2014-03-18: qty 20

## 2014-03-18 MED ORDER — NALOXONE HCL 0.4 MG/ML IJ SOLN
INTRAMUSCULAR | Status: AC
Start: 2014-03-18 — End: 2014-03-19
  Filled 2014-03-18: qty 1

## 2014-03-18 MED ORDER — NALOXONE HCL 1 MG/ML IJ SOLN
INTRAMUSCULAR | Status: AC
  Administered 2014-03-18: 2 mg
  Filled 2014-03-18: qty 2

## 2014-03-18 MED ORDER — ONDANSETRON HCL 4 MG/2ML IJ SOLN
INTRAMUSCULAR | Status: AC
  Administered 2014-03-18: 4 mg
  Filled 2014-03-18: qty 2

## 2014-03-18 NOTE — ED Provider Notes (Signed)
CSN: [id number]     Arrival date & time 03/18/14  1917 History   First MD Initiated Contact with Patient 03/18/14 1921     Chief Complaint  Patient presents with  . Altered Mental Status    unresponsive     (Consider location/radiation/quality/duration/timing/severity/associated sxs/prior Treatment) Patient is a 48 y.o. female presenting with altered mental status. The history is provided by the EMS personnel. The history is limited by the condition of the patient and the absence of a caregiver.  Altered Mental Status Severity:  Severe Most recent episode:  Today Episode history:  Single Timing:  Constant Progression:  Unable to specify Chronicity:  New Associated symptoms: seizures     48 yo F presents with AMS.  History obtained via EMS Reportedly patient with headache all day today. Went outside to smoke. Came back inside. Fell to ground. Struck head. Started having seizure like activity. Full body moving. Unresponsive with EMS. CBG 90. HDS. Placed on NRB.  Only known medication is tramadol. No known pmh.   Past Medical History  Diagnosis Date  . Migraines   . Endometriosis   . ETOH abuse     sober 3 1/2 years  . Anorexia    Past Surgical History  Procedure Laterality Date  . Knee surgery    . Abdominal surgery    . Cholecystectomy    . Appendectomy    . Abdominal hysterectomy    . Nasal sinus surgery    . Laproscopy    . Carpal tunnel release      2010   Family History  Problem Relation Age of Onset  . Hypertension Mother   . Hyperlipidemia Mother   . Heart failure Father   . Cancer Other    History  Substance Use Topics  . Smoking status: Current Every Day Smoker -- 0.25 packs/day    Types: Cigarettes  . Smokeless tobacco: Never Used  . Alcohol Use: No   OB History   Grav Para Term Preterm Abortions TAB SAB Ect Mult Living                 Review of Systems  Unable to perform ROS: Mental status change  Neurological: Positive for seizures.       Allergies  Aspirin; Doxycycline; Sulfa antibiotics; Imitrex; Iodinated diagnostic agents; Levofloxacin; Prednisone; Latex; and Nsaids  Home Medications   Prior to Admission medications   Medication Sig Start Date End Date Taking? Authorizing Provider  calcium-vitamin D (OSCAL WITH D) 500-200 MG-UNIT per tablet Take 1 tablet by mouth daily with breakfast.   Yes Historical Provider, MD  carisoprodol (SOMA) 350 MG tablet Take 350 mg by mouth 4 (four) times daily as needed for muscle spasms.    Yes Historical Provider, MD  clonazePAM (KLONOPIN) 1 MG tablet Take 1 mg by mouth 3 (three) times daily as needed for anxiety (sleep).    Yes Historical Provider, MD  diphenhydrAMINE (SOMINEX) 25 MG tablet Take 25 mg by mouth at bedtime as needed for sleep.   Yes Historical Provider, MD  multivitamin [facility]) per tablet Take 1 tablet by mouth daily.     Yes Historical Provider, MD  TRAMADOL HCL PO Take by mouth.   Yes Historical Provider, MD   BP 149/83  Pulse 79  Temp(Src) 98.4 F (36.9 C) (Oral)  Resp 18  Ht 5\' 2"  (1.575 m)  SpO2 96% Physical Exam  Nursing note and vitals reviewed. Constitutional: She appears well-developed and well-nourished.  HENT:  Head: Normocephalic  and atraumatic.  Eyes: Conjunctivae are normal. Pupils are equal, round, and reactive to light. Right eye exhibits no discharge. Left eye exhibits no discharge.  Neck: No tracheal deviation present.  Cardiovascular: Normal rate, regular rhythm, normal heart sounds and intact distal pulses.   Pulmonary/Chest: Effort normal and breath sounds normal. No stridor. No respiratory distress. She has no wheezes. She has no rales.  Abdominal: Soft. She exhibits no distension.  Musculoskeletal: She exhibits no edema.  Neurological: GCS eye subscore is 2. GCS verbal subscore is 1. GCS motor subscore is 1.  +gag (opens eyes at same time).  Skin: Skin is warm and dry.    ED Course  Procedures (including critical care  time) Labs Review Labs Reviewed  CBC WITH DIFFERENTIAL - Abnormal; Notable for the following:    WBC 15.3 (*)    Neutrophils Relative % 85 (*)    Neutro Abs 13.1 (*)    Lymphocytes Relative 11 (*)    All other components within normal limits  COMPREHENSIVE METABOLIC PANEL - Abnormal; Notable for the following:    Chloride 95 (*)    Glucose, Bld 131 (*)    Total Bilirubin 0.2 (*)    GFR calc non Af Amer 86 (*)    All other components within normal limits  SALICYLATE LEVEL - Abnormal; Notable for the following:    Salicylate Lvl <4.2 (*)    All other components within normal limits  URINALYSIS, ROUTINE W REFLEX MICROSCOPIC - Abnormal; Notable for the following:    Ketones, ur 15 (*)    All other components within normal limits  MAGNESIUM  PHOSPHORUS  URINE RAPID DRUG SCREEN (HOSP PERFORMED)  ACETAMINOPHEN LEVEL  ETHANOL  COMPREHENSIVE METABOLIC PANEL  CBC  PROTIME-INR    Imaging Review Ct Head Wo Contrast  03/18/2014   CLINICAL DATA:  Altered mental status, unresponsive  EXAM: CT HEAD WITHOUT CONTRAST  CT CERVICAL SPINE WITHOUT CONTRAST  TECHNIQUE: Multidetector CT imaging of the head and cervical spine was performed following the standard protocol without intravenous contrast. Multiplanar CT image reconstructions of the cervical spine were also generated.  COMPARISON:  None.  FINDINGS: CT HEAD FINDINGS  There is no evidence of mass effect, midline shift or extra-axial fluid collections. There is no evidence of a space-occupying lesion or intracranial hemorrhage. There is no evidence of a cortical-based area of acute infarction.  The ventricles and sulci are appropriate for the patient's age. The basal cisterns are patent.  Visualized portions of the orbits are unremarkable. The visualized portions of the paranasal sinuses and mastoid air cells are unremarkable.  The osseous structures are unremarkable.  There is a nasal airway present.  CT CERVICAL SPINE FINDINGS  The alignment is  anatomic. The vertebral body heights are maintained. There is no acute fracture. There is no static listhesis. The prevertebral soft tissues are normal. The intraspinal soft tissues are not fully imaged on this examination due to poor soft tissue contrast, but there is no gross soft tissue abnormality.  The disc spaces are maintained.  The visualized portions of the lung apices demonstrate no focal abnormality.  IMPRESSION: 1. No acute intracranial pathology. 2. No acute osseous injury of the cervical spine.   Electronically Signed   By: Kathreen Devoid   On: 03/18/2014 19:48   Ct Cervical Spine Wo Contrast  03/18/2014   CLINICAL DATA:  Altered mental status, unresponsive  EXAM: CT HEAD WITHOUT CONTRAST  CT CERVICAL SPINE WITHOUT CONTRAST  TECHNIQUE: Multidetector CT imaging of  the head and cervical spine was performed following the standard protocol without intravenous contrast. Multiplanar CT image reconstructions of the cervical spine were also generated.  COMPARISON:  None.  FINDINGS: CT HEAD FINDINGS  There is no evidence of mass effect, midline shift or extra-axial fluid collections. There is no evidence of a space-occupying lesion or intracranial hemorrhage. There is no evidence of a cortical-based area of acute infarction.  The ventricles and sulci are appropriate for the patient's age. The basal cisterns are patent.  Visualized portions of the orbits are unremarkable. The visualized portions of the paranasal sinuses and mastoid air cells are unremarkable.  The osseous structures are unremarkable.  There is a nasal airway present.  CT CERVICAL SPINE FINDINGS  The alignment is anatomic. The vertebral body heights are maintained. There is no acute fracture. There is no static listhesis. The prevertebral soft tissues are normal. The intraspinal soft tissues are not fully imaged on this examination due to poor soft tissue contrast, but there is no gross soft tissue abnormality.  The disc spaces are maintained.   The visualized portions of the lung apices demonstrate no focal abnormality.  IMPRESSION: 1. No acute intracranial pathology. 2. No acute osseous injury of the cervical spine.   Electronically Signed   By: Kathreen Devoid   On: 03/18/2014 19:48     EKG Interpretation None      MDM   Final diagnoses:  Seizure  Altered mental status, unspecified altered mental status type    48 yo F pw seizure. New for patient.  Upon arrival, patient maintaining airway, but low GCS. Monitored closely. Return to baseline. Likely was postictal upon arrival. Eventually was sitting up in bed speaking in full sentences.  Neuro consulted. recs for EEG and MR brain.  Admit to hospitalist.  CT head negative. Without evidence of significant trauma on exam.  Labs largely reassuring.   Labs and imaging reviewed by myself and considered in medical decision making if ordered. Imaging interpreted by radiology.   Discussed case with Dr. Kathrynn Humble who is in agreement with assessment and plan.      Bonnita Hollow, MD 03/19/14 [phone]

## 2014-03-18 NOTE — ED Notes (Signed)
Pt stated to move eyes and move arms for a moment. Responsive to ammonia tab. Pt coughing.

## 2014-03-18 NOTE — ED Notes (Signed)
ems found bottle of tramadol at home and another bottle of medication that was unmarked.

## 2014-03-18 NOTE — ED Notes (Signed)
Phlebotomy at bedside.

## 2014-03-18 NOTE — ED Notes (Signed)
Pt yawning frequently.

## 2014-03-18 NOTE — Consult Note (Addendum)
Neurology Consultation Reason for Consult: Seizure Referring Physician: Kathrynn Humble, A  CC: Seizure  History is obtained from: Patient  HPI: [name] is a 48 y.o. female with a history of a single previous seizure several years ago who presents with seizure that was seen at home. She has migraines and had a typical migraine over the course of the day, taking multiple doses of tramadol secondary to the headache. She was walking in to the house when her roommate witnessed her to slumped against the wall and then fall to the ground and have convulsive activity. This lasted for approximately 90 seconds. Following this, she was very confused and essentially unresponsive on arrival. Given unclear reason for unresponsiveness, she was given Narcan without improvement.   LKW: 6:30 PM tpa given?: no, mild symptoms, seizure at onset    ROS: A 14 point ROS was performed and is negative except as noted in the HPI.   Past Medical History  Diagnosis Date  . Migraines   . Endometriosis     Family History: Mother-hypertension  Social History: Tob: Current smoker  Exam: Current vital signs: BP 135/93  Pulse 84  Temp(Src) 96.7 F (35.9 C) (Rectal)  Resp 14  SpO2 97% Vital signs in last 24 hours: Temp:  [96.7 F (35.9 C)] 96.7 F (35.9 C) (06/29 1949) Pulse Rate:  [78-91] 84 (06/29 2143) Resp:  [12-15] 14 (06/29 2143) BP: (117-149)/(89-93) 135/93 mmHg (06/29 2143) SpO2:  [94 %-100 %] 97 % (06/29 2143)  General: In bed, c-collar in place CV: Regular rate and rhythm Mental Status: Patient is awake, alert, oriented to person, place, month, year, and situation. Immediate and remote memory are intact. Patient is able to give a clear and coherent history. No signs of aphasia or neglect Cranial Nerves: II: Visual Fields are full. Pupils are equal, round, and reactive to light.  Discs are difficult to visualize. III,IV, VI: EOMI without ptosis or diploplia.  V: Facial sensation is  decreased on the left VII: Facial movement is symmetric.  VIII: hearing is intact to voice X: Uvula elevates symmetrically XI: Shoulder shrug is symmetric. XII: tongue is midline without atrophy or fasciculations.  Motor: Tone is normal. Bulk is normal. 5/5 strength was present on the right side, she has 4+/5 weakness of the left arm with minimal drift, her bilateral leg surgical to examination does not give good effort due to back pain. Sensory: Sensation is diminished throughout the left side including face arm and leg Deep Tendon Reflexes: 2+ and symmetric in the biceps and patellae.  Cerebellar: FNF intact on the right, very slow to perform on the left Gait: Not performed secondary to patient safety concerns   I have reviewed labs in epic and the results pertinent to this consultation are: CMP-unremarkable CBC-leukocytosis  I have reviewed the images obtained: CT head-unremarkable  Impression: 48 year old female with seizure that is the second lifetime seizure, possibly provoked with tramadol use. Given her previous seizure, I do think that further evaluation with MRI and EEG is indicated. Also, she has persistent mild deficits the left side which could be postictal, but would need an MRI to further evaluate this.  I am not certain if she needs long-term antiepileptic therapy, but this could be discussion to be had with her once she is recovered from her current event. If MRI or EEG showing he reason to suspect seizure predisposition, then I would favor continuing antiepileptic therapy. Given that she took tramadol multiple times today, I would favor temporarily  covering her and she is been given a dose of Keppra in the ER which I feel should be sufficient. I discussed the patient's back pain with the ED physician who will further evaluate.  Recommendations: 1) MRI brain 2) EEG 3) will continue to follow   Roland Rack, MD [facility] [phone]  If 7pm-  7am, please page neurology on call as listed in [place].

## 2014-03-18 NOTE — ED Notes (Signed)
Neurology at bedside.

## 2014-03-18 NOTE — ED Notes (Signed)
Per ems-- pt from home. Pt has been having headache all day. Family member brought home tylenol at 86. Pt went outside to smoke. Came back in, fell striking head on wall. Pt had seizure like activity for approx 1 min. Pt unresponsive with ems. Nasal trumpet in place L nare. NRB. Gag reflex present. cbg 90.

## 2014-03-18 NOTE — H&P (Signed)
[facility] History and Physical  Patient: [name]  LEX:[id number]  DOB: 1966-09-02  DOS: the patient was seen and examined on 03/18/2014 PCP: No primary provider on file.  Chief Complaint: Seizure  HPI: [name] is a 48 y.o. female with Past medical history of migraines, depression, chronic back pain. Patient presented with complaints of seizure-like activity. She mentions that she had a headache all day. Because of the headache she went out to smoke and was taking tramadol and Tylenol. When she came back she felt dizzy lightheaded and sat on the ground struck her head. After that she started having seizure-like activity. This was witnessed by the patient's daughter. She mentions she had full tonic-clonic activity without any tongue bite or incontinence. This she had a history of seizure-like activity in the past once. Pt denies any fever, chills, headache, cough, chest pain, palpitation, shortness of breath, orthopnea, PND, nausea, vomiting, abdominal pain, diarrhea, constipation, active bleeding, burning urination, dizziness, pedal edema. Complains of left-sided back pain and generalized weakness on left side with numbness.  The patient is coming from home. And at her baseline independent for most of her ADL.  Review of Systems: as mentioned in the history of present illness.  A Comprehensive review of the other systems is negative.  Past Medical History  Diagnosis Date  . Migraines   . Endometriosis    Past Surgical History  Procedure Laterality Date  . Knee surgery    . Abdominal surgery    . Cholecystectomy    . Appendectomy    . Abdominal hysterectomy    . Nasal sinus surgery    . Laproscopy     Social History:  reports that she has been smoking Cigarettes.  She has been smoking about 0.25 packs per day. She has never used smokeless tobacco. She reports that she does not drink alcohol or use illicit drugs.  Allergies  Allergen Reactions  . Aspirin  Anaphylaxis  . Doxycycline Anaphylaxis  . Sulfa Antibiotics Anaphylaxis  . Imitrex [Sumatriptan Base] Other (See Comments)    Blood Pressure goes high  . Iodinated Diagnostic Agents   . Levofloxacin Other (See Comments)    Joints hurt  . Prednisone Other (See Comments)    She goes crazy  . Latex Rash  . Nsaids Rash    Family History  Problem Relation Age of Onset  . Hypertension Mother   . Hyperlipidemia Mother   . Heart failure Father   . Cancer Other     Prior to Admission medications   Medication Sig Start Date End Date Taking? Authorizing Provider  calcium-vitamin D (OSCAL WITH D) 500-200 MG-UNIT per tablet Take 1 tablet by mouth daily with breakfast.   Yes Historical Provider, MD  carisoprodol (SOMA) 350 MG tablet Take 350 mg by mouth 4 (four) times daily as needed for muscle spasms.    Yes Historical Provider, MD  clonazePAM (KLONOPIN) 1 MG tablet Take 1 mg by mouth 3 (three) times daily as needed for anxiety (sleep).    Yes Historical Provider, MD  diphenhydrAMINE (SOMINEX) 25 MG tablet Take 25 mg by mouth at bedtime as needed for sleep.   Yes Historical Provider, MD  multivitamin [facility]) per tablet Take 1 tablet by mouth daily.     Yes Historical Provider, MD    Physical Exam: Filed Vitals:   03/18/14 2143 03/18/14 2200 03/18/14 2245 03/18/14 2315  BP: 135/93 144/93 138/85 140/86  Pulse: 84 82 82 76  Temp:  TempSrc:      Resp: 14 13 16 17   SpO2: 97% 95% 95% 95%    General: Alert, Awake and Oriented to Time, Place and Person. Appear in mild distress Eyes: PERRL ENT: Oral Mucosa clear moist. Neck: no JVD Cardiovascular: S1 and S2 Present, no Murmur, Peripheral Pulses Present Respiratory: Bilateral Air entry equal and Decreased, Clear to Auscultation,  no Crackles,no wheezes Abdomen: Bowel Sound Present, Soft and Non tender Skin: no Rash Extremities: no Pedal edema, no calf tenderness Neurologic:  Mental status alert awake and oriented, Cranial Nerves  pupils are reactive, cough present, Motor strength right side 4 x 5 Left upper extremity limited above elbow Left lower extremity patient was able to lift all the way to 90 while she was trying to [place] But she was not able to provide any motor strength on examination of ankle prior to this, Sensation limited on left side,  reflexes present bilaterally, babinski and, Cerebellar test normal finger-nose-finger.  Labs on Admission:  CBC:  Recent Labs Lab 03/18/14 1620  WBC 15.3*  NEUTROABS 13.1*  HGB 14.5  HCT 43.1  MCV 95.1  PLT 337    CMP     Component Value Date/Time   NA 137 03/18/2014 1620   K 3.8 03/18/2014 1620   CL 95* 03/18/2014 1620   CO2 25 03/18/2014 1620   GLUCOSE 131* 03/18/2014 1620   BUN 12 03/18/2014 1620   CREATININE 0.80 03/18/2014 1620   CALCIUM 9.8 03/18/2014 1620   PROT 7.8 03/18/2014 1620   ALBUMIN 4.7 03/18/2014 1620   AST 15 03/18/2014 1620   ALT 11 03/18/2014 1620   ALKPHOS 76 03/18/2014 1620   BILITOT 0.2* 03/18/2014 1620   GFRNONAA 86* 03/18/2014 1620   GFRAA >90 03/18/2014 1620    No results found for this basename: LIPASE, AMYLASE,  in the last 168 hours No results found for this basename: AMMONIA,  in the last 168 hours  No results found for this basename: CKTOTAL, CKMB, CKMBINDEX, TROPONINI,  in the last 168 hours BNP (last 3 results) No results found for this basename: PROBNP,  in the last 8760 hours  Radiological Exams on Admission: Ct Head Wo Contrast  03/18/2014   CLINICAL DATA:  Altered mental status, unresponsive  EXAM: CT HEAD WITHOUT CONTRAST  CT CERVICAL SPINE WITHOUT CONTRAST  TECHNIQUE: Multidetector CT imaging of the head and cervical spine was performed following the standard protocol without intravenous contrast. Multiplanar CT image reconstructions of the cervical spine were also generated.  COMPARISON:  None.  FINDINGS: CT HEAD FINDINGS  There is no evidence of mass effect, midline shift or extra-axial fluid collections.  There is no evidence of a space-occupying lesion or intracranial hemorrhage. There is no evidence of a cortical-based area of acute infarction.  The ventricles and sulci are appropriate for the patient's age. The basal cisterns are patent.  Visualized portions of the orbits are unremarkable. The visualized portions of the paranasal sinuses and mastoid air cells are unremarkable.  The osseous structures are unremarkable.  There is a nasal airway present.  CT CERVICAL SPINE FINDINGS  The alignment is anatomic. The vertebral body heights are maintained. There is no acute fracture. There is no static listhesis. The prevertebral soft tissues are normal. The intraspinal soft tissues are not fully imaged on this examination due to poor soft tissue contrast, but there is no gross soft tissue abnormality.  The disc spaces are maintained.  The visualized portions of the lung apices  demonstrate no focal abnormality.  IMPRESSION: 1. No acute intracranial pathology. 2. No acute osseous injury of the cervical spine.   Electronically Signed   By: Kathreen Devoid   On: 03/18/2014 19:48   Ct Cervical Spine Wo Contrast  03/18/2014   CLINICAL DATA:  Altered mental status, unresponsive  EXAM: CT HEAD WITHOUT CONTRAST  CT CERVICAL SPINE WITHOUT CONTRAST  TECHNIQUE: Multidetector CT imaging of the head and cervical spine was performed following the standard protocol without intravenous contrast. Multiplanar CT image reconstructions of the cervical spine were also generated.  COMPARISON:  None.  FINDINGS: CT HEAD FINDINGS  There is no evidence of mass effect, midline shift or extra-axial fluid collections. There is no evidence of a space-occupying lesion or intracranial hemorrhage. There is no evidence of a cortical-based area of acute infarction.  The ventricles and sulci are appropriate for the patient's age. The basal cisterns are patent.  Visualized portions of the orbits are unremarkable. The visualized portions of the paranasal  sinuses and mastoid air cells are unremarkable.  The osseous structures are unremarkable.  There is a nasal airway present.  CT CERVICAL SPINE FINDINGS  The alignment is anatomic. The vertebral body heights are maintained. There is no acute fracture. There is no static listhesis. The prevertebral soft tissues are normal. The intraspinal soft tissues are not fully imaged on this examination due to poor soft tissue contrast, but there is no gross soft tissue abnormality.  The disc spaces are maintained.  The visualized portions of the lung apices demonstrate no focal abnormality.  IMPRESSION: 1. No acute intracranial pathology. 2. No acute osseous injury of the cervical spine.   Electronically Signed   By: Kathreen Devoid   On: 03/18/2014 19:48    EKG: Independently reviewed. normal sinus rhythm, nonspecific ST and T waves changes.  Assessment/Plan Principal Problem:   Seizure Active Problems:   Depression   Chronic back pain   1. Seizure Patient presents with seizure-like activity. At present she has left-sided weakness and sensory limitation which may or may not be functional. CT of the head is negative for any acute pulmonic the pediatric neurologist was admitted for the patient. At present MRI EEG and monitoring in the hospital. Patient has already received Keppra. C. ciprofloxacin IV acute. Continue with lorazepam. Hold tramadol.  2. Back pain Obtain x-ray of the lumbar spine and continue using pain medications as needed.  Consults: neurology  DVT Prophylaxis: subcutaneous Heparin Nutrition: Regular diet  Code Status: Full  Family Communication: Family was present at bedside, opportunity was given to ask question and all questions were answered satisfactorily at the time of interview. Disposition: Admitted to observation in telemetry unit.  Author: Berle Mull, MD [facility] Pager: [phone] 03/18/2014, 11:58 PM    If 7PM-7AM, please contact  night-coverage [email or web address] Password [email or web address]  **Disclaimer: This note may have been dictated with voice recognition software. Similar sounding words can inadvertently be transcribed and this note may contain transcription errors which may not have been corrected upon publication of note.**

## 2014-03-19 ENCOUNTER — Observation Stay (HOSPITAL_COMMUNITY): Payer: 59

## 2014-03-19 ENCOUNTER — Encounter (HOSPITAL_COMMUNITY): Payer: Self-pay

## 2014-03-19 DIAGNOSIS — M6281 Muscle weakness (generalized): Secondary | ICD-10-CM

## 2014-03-19 DIAGNOSIS — F411 Generalized anxiety disorder: Secondary | ICD-10-CM

## 2014-03-19 DIAGNOSIS — G8929 Other chronic pain: Secondary | ICD-10-CM

## 2014-03-19 DIAGNOSIS — R4182 Altered mental status, unspecified: Secondary | ICD-10-CM

## 2014-03-19 DIAGNOSIS — M549 Dorsalgia, unspecified: Secondary | ICD-10-CM

## 2014-03-19 DIAGNOSIS — R569 Unspecified convulsions: Secondary | ICD-10-CM

## 2014-03-19 LAB — COMPREHENSIVE METABOLIC PANEL
ALBUMIN: 3.8 g/dL (ref 3.5–5.2)
ALK PHOS: 64 U/L (ref 39–117)
ALT: 10 U/L (ref 0–35)
AST: 19 U/L (ref 0–37)
BUN: 8 mg/dL (ref 6–23)
CALCIUM: 8.8 mg/dL (ref 8.4–10.5)
CO2: 27 mEq/L (ref 19–32)
Chloride: 99 mEq/L (ref 96–112)
Creatinine, Ser: 0.64 mg/dL (ref 0.50–1.10)
GFR calc Af Amer: >90 mL/min (ref >90)
GFR calc non Af Amer: >90 mL/min (ref >90)
Glucose, Bld: 112 mg/dL — ABNORMAL HIGH (ref 70–99)
Potassium: 3.4 mEq/L — ABNORMAL LOW (ref 3.7–5.3)
SODIUM: 138 meq/L (ref 137–147)
TOTAL PROTEIN: 6.6 g/dL (ref 6.0–8.3)
Total Bilirubin: 0.3 mg/dL (ref 0.3–1.2)

## 2014-03-19 LAB — CBC
HEMATOCRIT: 37.7 % (ref 36.0–46.0)
HEMOGLOBIN: 12.6 g/dL (ref 12.0–15.0)
MCH: 31.7 pg (ref 26.0–34.0)
MCHC: 33.4 g/dL (ref 30.0–36.0)
MCV: 95 fL (ref 78.0–100.0)
Platelets: 324 10*3/uL (ref 150–400)
RBC: 3.97 MIL/uL (ref 3.87–5.11)
RDW: 12.5 % (ref 11.5–15.5)
WBC: 12.9 10*3/uL — ABNORMAL HIGH (ref 4.0–10.5)

## 2014-03-19 LAB — PROTIME-INR
INR: 1.05 (ref 0.00–1.49)
Prothrombin Time: 13.7 seconds (ref 11.6–15.2)

## 2014-03-19 MED ORDER — LORAZEPAM 2 MG/ML IJ SOLN: 2.0000 mg | Freq: Once | INTRAMUSCULAR | Status: AC | PRN

## 2014-03-19 MED ORDER — LORAZEPAM 2 MG/ML IJ SOLN
1.0000 mg | Freq: Once | INTRAMUSCULAR | Status: AC
  Administered 2014-03-19: 1 mg via INTRAVENOUS

## 2014-03-19 MED ORDER — ACETAMINOPHEN 325 MG PO TABS: 650.0000 mg | ORAL_TABLET | Freq: Four times a day (QID) | ORAL | Status: DC | PRN

## 2014-03-19 MED ORDER — SODIUM CHLORIDE 0.9 % IJ SOLN
3.0000 mL | Freq: Two times a day (BID) | INTRAMUSCULAR | Status: DC
  Administered 2014-03-19 – 2014-03-20 (×3): 3 mL via INTRAVENOUS

## 2014-03-19 MED ORDER — MORPHINE SULFATE 2 MG/ML IJ SOLN
2.0000 mg | INTRAMUSCULAR | Status: DC | PRN
  Administered 2014-03-19: 2 mg via INTRAVENOUS
  Filled 2014-03-19: qty 1

## 2014-03-19 MED ORDER — CYCLOBENZAPRINE HCL 10 MG PO TABS
10.0000 mg | ORAL_TABLET | Freq: Three times a day (TID) | ORAL | Status: DC | PRN
  Administered 2014-03-19: 10 mg via ORAL
  Filled 2014-03-19 (×2): qty 1

## 2014-03-19 MED ORDER — ONDANSETRON HCL 4 MG PO TABS: 4.0000 mg | ORAL_TABLET | Freq: Four times a day (QID) | ORAL | Status: DC | PRN

## 2014-03-19 MED ORDER — CLONAZEPAM 1 MG PO TABS
1.0000 mg | ORAL_TABLET | Freq: Three times a day (TID) | ORAL | Status: DC | PRN
  Administered 2014-03-19 – 2014-03-20 (×3): 1 mg via ORAL
  Filled 2014-03-19 (×3): qty 1

## 2014-03-19 MED ORDER — LORAZEPAM 2 MG/ML IJ SOLN
1.0000 mg | INTRAMUSCULAR | Status: DC | PRN
  Administered 2014-03-20: 1 mg via INTRAVENOUS
  Filled 2014-03-19 (×2): qty 1

## 2014-03-19 MED ORDER — ENOXAPARIN SODIUM 40 MG/0.4ML ~~LOC~~ SOLN
40.0000 mg | SUBCUTANEOUS | Status: DC
  Administered 2014-03-19 – 2014-03-20 (×2): 40 mg via SUBCUTANEOUS
  Filled 2014-03-19 (×2): qty 0.4

## 2014-03-19 MED ORDER — ACETAMINOPHEN 650 MG RE SUPP: 650.0000 mg | Freq: Four times a day (QID) | RECTAL | Status: DC | PRN

## 2014-03-19 MED ORDER — OXYCODONE HCL 5 MG PO TABS
5.0000 mg | ORAL_TABLET | ORAL | Status: DC | PRN
  Administered 2014-03-19 – 2014-03-20 (×9): 5 mg via ORAL
  Filled 2014-03-19 (×9): qty 1

## 2014-03-19 MED ORDER — CARISOPRODOL 350 MG PO TABS
350.0000 mg | ORAL_TABLET | Freq: Four times a day (QID) | ORAL | Status: DC | PRN
  Administered 2014-03-19 – 2014-03-20 (×5): 350 mg via ORAL
  Filled 2014-03-19 (×5): qty 1

## 2014-03-19 MED ORDER — ONDANSETRON HCL 4 MG/2ML IJ SOLN: 4.0000 mg | Freq: Four times a day (QID) | INTRAMUSCULAR | Status: DC | PRN

## 2014-03-19 NOTE — Progress Notes (Signed)
Rehab Admissions Coordinator Note:  Patient was screened by Cleatrice Burke for appropriateness for an Inpatient Acute Rehab Consult per PT recommendation.  At this time, we are recommending await further medical workup to determine most appropriate rehab venue.Cleatrice Burke 03/19/2014, 4:21 PM  I can be reached at [phone].

## 2014-03-19 NOTE — ED Provider Notes (Signed)
I performed a history and physical examination of  [name] and discussed her management with Dr. Areta Haber. I agree with the history, physical, assessment, and plan of care, with the following exceptions: None I was present for the following procedures: None  Time Spent in Critical Care of the patient: 45 minutes  Time spent in discussions with the patient and family: 69 min  [name]  Pt to ER with cc of unresponsiveness. Per EMS and later room mate - pt was having a headache, and then just collapsed, followed by seizure like activity. Arrived to the ER with GCS of 3. DDX - toxic encephalopathy vs. [place] stroke, vs. Seizures Responded for a little while to narcan. Pt had a gag, and we thought she was possibly post ictal, and so patient's head was raised to 30 degrees - and we monitored her closely for extended perid of time. Finally neuro was consulted - in the interim, patient became more alert and was admitted.  Impression: Altered mental status Seizure Possible overdose Comatose  CRITICAL CARE Performed by: Varney Biles   Total critical care time: 92 miin  Critical care time was exclusive of separately billable procedures and treating other patients.  Critical care was necessary to treat or prevent imminent or life-threatening deterioration.  Critical care was time spent personally by me on the following activities: development of treatment plan with patient and/or surrogate as well as nursing, discussions with consultants, evaluation of patient's response to treatment, examination of patient, obtaining history from patient or surrogate, ordering and performing treatments and interventions, ordering and review of laboratory studies, ordering and review of radiographic studies, pulse oximetry and re-evaluation of patient's condition.   Varney Biles, MD 03/19/14 2127

## 2014-03-19 NOTE — Progress Notes (Signed)
UR Completed.  Brown, Sarah Jane [phone] 03/19/2014  

## 2014-03-19 NOTE — Progress Notes (Signed)
Subjective: Patient has had no further seizure episodes. Continues to complain of left face, arm and leg decreased sensation and weakness.   Objective: Current vital signs: BP 135/83  Pulse 71  Temp(Src) 98.4 F (36.9 C) (Oral)  Resp 18  Ht 5\' 2"  (1.575 m)  Wt 61.462 kg (135 lb 8 oz)  BMI 24.78 kg/m2  SpO2 96% Vital signs in last 24 hours: Temp:  [96.7 F (35.9 C)-98.9 F (37.2 C)] 98.4 F (36.9 C) (06/30 1056) Pulse Rate:  [71-91] 71 (06/30 1056) Resp:  [12-18] 18 (06/30 1056) BP: (117-149)/(74-93) 135/83 mmHg (06/30 1056) SpO2:  [94 %-100 %] 96 % (06/30 1056) Weight:  [61.462 kg (135 lb 8 oz)] 61.462 kg (135 lb 8 oz) (06/30 0503)  Intake/Output from previous day:   Intake/Output this shift: Total I/O In: 120 [P.O.:120] Out: -  Nutritional status: General  Neurologic Exam: Mental Status: Alert, oriented, thought content appropriate.  Speech fluent without evidence of aphasia.  Able to follow 3 step commands without difficulty. Cranial Nerves: II:  Visual fields grossly normal, pupils equal, round, reactive to light and accommodation III,IV, VI: ptosis not present, extra-ocular motions intact bilaterally V,VII: smile symmetric, facial light touch sensation decreased on the left VIII: hearing normal bilaterally IX,X: gag reflex present XI: bilateral shoulder shrug XII: midline tongue extension without atrophy or fasciculations  Motor: Right : Upper extremity   5/5    Left:     Upper extremity   4/5 (significant give way weakness)  Lower extremity   5/5     Lower extremity   3/5 (significant give way weakness) Tone and bulk:normal tone throughout; no atrophy noted Sensory: Pinprick and light touch decreased on the left arm and leg Deep Tendon Reflexes:  Right: Upper Extremity   Left: Upper extremity   biceps (C-5 to C-6) 2/4   biceps (C-5 to C-6) 2/4 tricep (C7) 2/4    triceps (C7) 2/4 Brachioradialis (C6) 2/4  Brachioradialis (C6) 2/4  Lower Extremity Lower  Extremity  quadriceps (L-2 to L-4) 2/4   quadriceps (L-2 to L-4) 2/4 Achilles (S1) 2/4   Achilles (S1) 2/4  Plantars: Right: downgoing   Left: downgoing Cerebellar: normal finger-to-nose,  normal heel-to-shin test on the right and slow to perform    Lab Results: Basic Metabolic Panel:  Recent Labs Lab 03/18/14 1620 03/19/14 0536  NA 137 138  K 3.8 3.4*  CL 95* 99  CO2 25 27  GLUCOSE 131* 112*  BUN 12 8  CREATININE 0.80 0.64  CALCIUM 9.8 8.8  MG 2.2  --   PHOS 3.8  --     Liver Function Tests:  Recent Labs Lab 03/18/14 1620 03/19/14 0536  AST 15 19  ALT 11 10  ALKPHOS 76 64  BILITOT 0.2* 0.3  PROT 7.8 6.6  ALBUMIN 4.7 3.8   No results found for this basename: LIPASE, AMYLASE,  in the last 168 hours No results found for this basename: AMMONIA,  in the last 168 hours  CBC:  Recent Labs Lab 03/18/14 1620 03/19/14 0536  WBC 15.3* 12.9*  NEUTROABS 13.1*  --   HGB 14.5 12.6  HCT 43.1 37.7  MCV 95.1 95.0  PLT 337 324    Cardiac Enzymes: No results found for this basename: CKTOTAL, CKMB, CKMBINDEX, TROPONINI,  in the last 168 hours  Lipid Panel: No results found for this basename: CHOL, TRIG, HDL, CHOLHDL, VLDL, LDLCALC,  in the last 168 hours  CBG: No results found for this basename:  GLUCAP,  in the last 168 hours  Microbiology: Results for orders placed during the hospital encounter of 01/08/10  CLOSTRIDIUM DIFFICILE EIA     Status: None   Collection Time    01/09/10 12:28 AM      Result Value Ref Range Status   Specimen Description STOOL   Final   Special Requests IMMUNE:NORM   Final   C difficile Toxins A+B, EIA NEGATIVE   Final   Report Status 01/09/2010 FINAL   Final  CLOSTRIDIUM DIFFICILE EIA     Status: None   Collection Time    01/10/10 10:08 AM      Result Value Ref Range Status   Specimen Description STOOL   Final   Special Requests NONE   Final   C difficile Toxins A+B, EIA NEGATIVE   Final   Report Status 01/11/2010 FINAL    Final    Coagulation Studies:  Recent Labs  03/19/14 0536  LABPROT 13.7  INR 1.05    Imaging: Dg Lumbar Spine Complete  03/19/2014   CLINICAL DATA:  Low back pain post trauma  EXAM: LUMBAR SPINE - COMPLETE 4+ VIEW  COMPARISON:  Jan 18, 2010  FINDINGS: Frontal, lateral, spot lumbosacral lateral, and bilateral oblique views were obtained. There are 5 non-rib-bearing lumbar type vertebral bodies. There is lumbar dextroscoliosis. There is no fracture or spondylolisthesis. Disc spaces appear intact. There is no appreciable facet arthropathy.  IMPRESSION: Scoliosis.  No fracture or appreciable arthropathy.   Electronically Signed   By: Lowella Grip M.D.   On: 03/19/2014 08:10   Ct Head Wo Contrast  03/18/2014   CLINICAL DATA:  Altered mental status, unresponsive  EXAM: CT HEAD WITHOUT CONTRAST  CT CERVICAL SPINE WITHOUT CONTRAST  TECHNIQUE: Multidetector CT imaging of the head and cervical spine was performed following the standard protocol without intravenous contrast. Multiplanar CT image reconstructions of the cervical spine were also generated.  COMPARISON:  None.  FINDINGS: CT HEAD FINDINGS  There is no evidence of mass effect, midline shift or extra-axial fluid collections. There is no evidence of a space-occupying lesion or intracranial hemorrhage. There is no evidence of a cortical-based area of acute infarction.  The ventricles and sulci are appropriate for the patient's age. The basal cisterns are patent.  Visualized portions of the orbits are unremarkable. The visualized portions of the paranasal sinuses and mastoid air cells are unremarkable.  The osseous structures are unremarkable.  There is a nasal airway present.  CT CERVICAL SPINE FINDINGS  The alignment is anatomic. The vertebral body heights are maintained. There is no acute fracture. There is no static listhesis. The prevertebral soft tissues are normal. The intraspinal soft tissues are not fully imaged on this examination due to  poor soft tissue contrast, but there is no gross soft tissue abnormality.  The disc spaces are maintained.  The visualized portions of the lung apices demonstrate no focal abnormality.  IMPRESSION: 1. No acute intracranial pathology. 2. No acute osseous injury of the cervical spine.   Electronically Signed   By: Kathreen Devoid   On: 03/18/2014 19:48   Ct Cervical Spine Wo Contrast  03/18/2014   CLINICAL DATA:  Altered mental status, unresponsive  EXAM: CT HEAD WITHOUT CONTRAST  CT CERVICAL SPINE WITHOUT CONTRAST  TECHNIQUE: Multidetector CT imaging of the head and cervical spine was performed following the standard protocol without intravenous contrast. Multiplanar CT image reconstructions of the cervical spine were also generated.  COMPARISON:  None.  FINDINGS: CT HEAD  FINDINGS  There is no evidence of mass effect, midline shift or extra-axial fluid collections. There is no evidence of a space-occupying lesion or intracranial hemorrhage. There is no evidence of a cortical-based area of acute infarction.  The ventricles and sulci are appropriate for the patient's age. The basal cisterns are patent.  Visualized portions of the orbits are unremarkable. The visualized portions of the paranasal sinuses and mastoid air cells are unremarkable.  The osseous structures are unremarkable.  There is a nasal airway present.  CT CERVICAL SPINE FINDINGS  The alignment is anatomic. The vertebral body heights are maintained. There is no acute fracture. There is no static listhesis. The prevertebral soft tissues are normal. The intraspinal soft tissues are not fully imaged on this examination due to poor soft tissue contrast, but there is no gross soft tissue abnormality.  The disc spaces are maintained.  The visualized portions of the lung apices demonstrate no focal abnormality.  IMPRESSION: 1. No acute intracranial pathology. 2. No acute osseous injury of the cervical spine.   Electronically Signed   By: Kathreen Devoid   On:  03/18/2014 19:48    Medications:  Scheduled: . enoxaparin (LOVENOX) injection  40 mg Subcutaneous Q24H  . sodium chloride  3 mL Intravenous Q12H    Assessment/Plan: 48 year old female with seizure that is the second lifetime seizure, possibly provoked with tramadol use. She continues to have persistent left arm and leg decreased sensation and leg>arm weakness. Currently on no AED and awaiting MRI and EEG to make further decision about need for AED as out patient.    Recommendations:  1) MRI brain  2) EEG  3) Will continue to follow   Etta Quill PA-C [facility] [phone]  03/19/2014, 11:35 AM

## 2014-03-19 NOTE — Progress Notes (Signed)
EEG Completed; Results Pending  

## 2014-03-19 NOTE — Evaluation (Signed)
Physical Therapy Evaluation Patient Details Name: [name] MRN: [id number] DOB: 09/01/1966 Today's Date: 03/19/2014   History of Present Illness  [name] is a 48 y.o. female with Past medical history of migraines, depression, chronic back pain. Patient presented 6/29 with complaints of seizure-like activity. She mentions that she had a headache all day. Because of the headache she went out to smoke and was taking tramadol and Tylenol. When she came back she felt dizzy lightheaded and sat on the ground struck her head. After that she started having seizure-like activity. This was witnessed by the patient's daughter. She mentions she had full tonic-clonic activity without any tongue bite or incontinence. Complains of left-sided back pain and generalized weakness on left side with numbness.  Clinical Impression  Pt with report of L sided hemiplegia, pt with inconsistent MMT findings compared to functional observation. Pt currently at significant falls risk and when pt told to call someone for help to go to bathroom or get back to bed she said "I"ll just turn the alarm off. I don't need anyone to help me." When CIR or 24/7 supervision with HHPT was recommended pt reports 'I'm not leaving here until I"m better. When I set my mind to something I get better." Pt was indep and working PTA. Pt to benefit from CIR to achieve safe mod I function as pt would be home alone during the day.     Follow Up Recommendations CIR;Supervision/Assistance - 24 hour    Equipment Recommendations  None recommended by PT    Recommendations for Other Services Rehab consult     Precautions / Restrictions Precautions Precautions: Fall Precaution Comments: inconsistent presentation vs report of symptoms Restrictions Weight Bearing Restrictions: No      Mobility  Bed Mobility Overal bed mobility: Modified Independent             General bed mobility comments: pt used bed rail with R UE, pt able to swing  LEs off edge of bed  Transfers Overall transfer level: Needs assistance Equipment used: Rolling walker (2 wheeled) Transfers: Sit to/from Stand Sit to Stand: Min guard         General transfer comment: pt pulled self to standing with R UE despite v/c's to push up from bed. pt with strong lean onto R side, v/c's to obtain midline  Ambulation/Gait Ambulation/Gait assistance: Mod assist Ambulation Distance (Feet): 10 Feet Assistive device: Rolling walker (2 wheeled) Gait Pattern/deviations: Step-to pattern;Decreased step length - left;Decreased stance time - left Gait velocity: slow   General Gait Details: pt appeared to have occasional L knee instability/buckling but caught self. pt with strong lean onto R UE. reporting "My arms are getting so weak" and pt began to have increased trunk flexion despite max v/c's to maintain upright position. Pt able to hold onto walker with L UE but leaned over to R UE. Pt returned to normal affect once seated in chair  Stairs            Wheelchair Mobility    Modified Rankin (Stroke Patients Only)       Balance Overall balance assessment: Needs assistance Sitting-balance support: Feet supported Sitting balance-Leahy Scale: Fair     Standing balance support: Bilateral upper extremity supported Standing balance-Leahy Scale: Poor Standing balance comment: pt with L LE instabilty requiring physical support                             Pertinent  Vitals/Pain Did not rate but reports of back pain from fall    Home Living Family/patient expects to be discharged to:: Private residence Living Arrangements: Non-relatives/Friends (roommate) Available Help at Discharge: Friend(s);Family;Available PRN/intermittently Type of Home: House Home Access: Stairs to enter Entrance Stairs-Rails: None Entrance Stairs-Number of Steps: 3 Home Layout: Two level Home Equipment: None      Prior Function Level of Independence: Independent          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 pt appears to have 2/5 shld strength but was able to use it to assist with transfers   Lower Extremity Assessment: LLE deficits/detail   LLE Deficits / Details: pt with inconsistent functional presentation with MMT. Pt able to hold LE against gravity for >10 sec however immeadiately breaks with resistance. pt able to hold LE in extension for PT to don sock. Pt then used L UE to pull sock up  Cervical / Trunk Assessment:  (back pain)  Communication   Communication: No difficulties  Cognition Arousal/Alertness: Awake/alert Behavior During Therapy: WFL for tasks assessed/performed Overall Cognitive Status: Impaired/Different from baseline Area of Impairment: Awareness;Safety/judgement         Safety/Judgement: Decreased awareness of safety (reports to RN and PT "I will just turn off the alarm when I get up"  Pt refusing assist however con't to report and demo apparent  L sided weakness causing pt to be at extremely high falls risk      General Comments: pt resistive to recommendations, pt with inconsistent presentation of function and report of symptoms    General Comments      Exercises        Assessment/Plan    PT Assessment Patient needs continued PT services  PT Diagnosis Difficulty walking;Generalized weakness;Acute pain;Hemiplegia non-dominant side   PT Problem List Decreased strength;Decreased activity tolerance;Decreased balance;Decreased mobility;Decreased coordination;Decreased safety awareness  PT Treatment Interventions DME instruction;Gait training;Stair training;Functional mobility training;Therapeutic activities;Therapeutic exercise;Balance training;Neuromuscular re-education   PT  Goals (Current goals can be found in the Care Plan section) Acute Rehab PT Goals Patient Stated Goal: home PT Goal Formulation: With patient Time For Goal Achievement: 03/26/14 Potential to Achieve Goals: Good    Frequency Min 4X/week   Barriers to discharge Decreased caregiver support would be alone during the day    Co-evaluation               End of Session Equipment Utilized During Treatment: Gait belt Activity Tolerance: Patient tolerated treatment well Patient left: in chair;with call bell/phone within reach;with chair alarm set;with family/visitor present Nurse Communication: Mobility status    Functional Assessment Tool Used: clinical judgement Functional Limitation: Mobility: Walking and moving around Mobility: Walking and Moving Around Current Status [phone]): At least 20 percent but less than 40 percent impaired, limited or restricted Mobility: Walking and Moving Around Goal Status [phone]): At least 1 percent but less than 20 percent impaired, limited or restricted    Time: 4656-8127 PT Time Calculation (min): 28 min   Charges:   PT Evaluation $Initial PT Evaluation Tier I: 1 Procedure PT Treatments $Gait Training: 8-22 mins   PT G Codes:   Functional Assessment Tool Used: clinical judgement Functional Limitation: Mobility: Walking and moving around    [place],  Knute Neu 03/19/2014, 4:12 PM  Kittie Plater, PT, DPT Pager #: [phone] Office #: [phone]

## 2014-03-19 NOTE — Procedures (Signed)
ELECTROENCEPHALOGRAM REPORT  Patient: [name]       Room #: 4X32  EEG No. ID: [id number] Age: 48 y.o.        Sex: female Referring Physician: Ghimire Report Date:  03/19/2014        Interpreting Physician: Anthony Sar  History: [name] is an 47 y.o. female history migraine headache as well as seizure several years ago who experienced a witnessed generalized seizure prior to being brought to the hospital. Seizure occurred in the setting of taking tramadol for headache.  Indications for study:  Rule out seizure disorder.  Technique: This is an 18 channel routine scalp EEG performed at the bedside with bipolar and monopolar montages arranged in accordance to the international 10/20 system of electrode placement.   Description: This EEG recording was performed during wakefulness and during sleep. The background activity during wakefulness consisted of 9 Hz symmetrical alpha rhythm recorder from posterior head regions as well as diffuse low amplitude 20-25 Hz beta activity. Photic stimulation produced symmetrical occipital driving response. Hyperventilation was not performed. There was slowing of background activity with mixed irregular delta and theta activity diffusely and symmetrically during sleep. During stage II sleep symmetrical vertex waves, sleep spindles and K-complexes were recorded. No epileptiform discharges were recorded during wakefulness nor during sleep.  Interpretation: This is a normal EEG recording during wakefulness during sleep. No evidence of an epileptic disorder is demonstrated.   Rush Farmer M.D. [facility] [phone]

## 2014-03-19 NOTE — Progress Notes (Signed)
oob to bedside toilet with 1 person assist. gait belt. Sit to stand impaired, with mod weakness to left leg, and max weakness to lue. Toileting independent. Back to bed with no distress.

## 2014-03-19 NOTE — Progress Notes (Signed)
PATIENT DETAILS Name: [name] Age: 48 y.o. Sex: female Date of Birth: Aug 28, 1966 Admit Date: 03/18/2014 Admitting Physician Berle Mull, MD PCP:No primary provider on file.  Subjective: Still complaints of left sided weakness.Complains of ongoing headache and backpain.  Assessment/Plan: Principal Problem:   Seizure -admitted with second episode of seizure. Current suspicion, is that it may have been provoked by use of Tramadol. Seen by Neurology, work in progress, await MRI Brain, EEG. Given claustrophobia will premedicate with IV Ativan prior to MRI. Reviewed Neuro input, apart from one dose of Keppra given in the ED,no further anti-epileptics needed. Decision for long term anti-epileptics will depend on EEG/MRI Brain results  Active Problems: Left Sided weakness -mild -not sure whether this is Todd's Paresis or acute CVA -await MRI Brain, if neg for CVA-will not require any further work up  Chronic Back Pain -c/w supportive care  Hx of Migraine -supportive care  Hx of Anxiety -c/w Klonopin  Disposition: Remain inpatient  DVT Prophylaxis: Prophylactic Lovenox  Code Status: Full code  Family Communication None at bedside  Procedures:  None  CONSULTS:  neurology  Time spent 40 minutes-which includes 50% of the time with face-to-face with patient/ family and coordinating care related to the above assessment and plan.    MEDICATIONS: Scheduled Meds: . enoxaparin (LOVENOX) injection  40 mg Subcutaneous Q24H  . sodium chloride  3 mL Intravenous Q12H   Continuous Infusions:  PRN Meds:.acetaminophen, acetaminophen, carisoprodol, clonazePAM, cyclobenzaprine, LORazepam, LORazepam, morphine injection, ondansetron (ZOFRAN) IV, ondansetron, oxyCODONE  Antibiotics: Anti-infectives   None       PHYSICAL EXAM: Vital signs in last 24 hours: Filed Vitals:   03/19/14 0005 03/19/14 0030 03/19/14 0200 03/19/14 0503  BP:  149/83 130/74 119/80    Pulse:  79 74 77  Temp: 96.7 F (35.9 C) 98.4 F (36.9 C) 98.4 F (36.9 C) 98.9 F (37.2 C)  TempSrc:  Oral Oral Oral  Resp:  18 18 18   Height:  5\' 2"  (1.575 m)    Weight:    61.462 kg (135 lb 8 oz)  SpO2:  96% 97% 97%    Weight change:  Filed Weights   03/19/14 0503  Weight: 61.462 kg (135 lb 8 oz)   Body mass index is 24.78 kg/(m^2).   Gen Exam: Awake and alert with clear speech.   Neck: Supple, No JVD.   Chest: B/L Clear.   CVS: S1 S2 Regular, no murmurs.  Abdomen: soft, BS +, non tender, non distended.  Extremities: no edema, lower extremities warm to touch. Neurologic:Mild left sided weakness-able to lift leg/arm-without difficulty-but weaker than right side   Skin: No Rash.   Wounds: N/A.    Intake/Output from previous day: No intake or output data in the 24 hours ending 03/19/14 0901   LAB RESULTS: CBC  Recent Labs Lab 03/18/14 1620 03/19/14 0536  WBC 15.3* 12.9*  HGB 14.5 12.6  HCT 43.1 37.7  PLT 337 324  MCV 95.1 95.0  MCH 32.0 31.7  MCHC 33.6 33.4  RDW 12.5 12.5  LYMPHSABS 1.6  --   MONOABS 0.5  --   EOSABS 0.0  --   BASOSABS 0.0  --     Chemistries   Recent Labs Lab 03/18/14 1620 03/19/14 0536  NA 137 138  K 3.8 3.4*  CL 95* 99  CO2 25 27  GLUCOSE 131* 112*  BUN 12 8  CREATININE 0.80 0.64  CALCIUM 9.8 8.8  MG  2.2  --     CBG: No results found for this basename: GLUCAP,  in the last 168 hours  GFR Estimated Creatinine Clearance: 75.1 ml/min (by C-G formula based on Cr of 0.64).  Coagulation profile  Recent Labs Lab 03/19/14 0536  INR 1.05    Cardiac Enzymes No results found for this basename: CK, CKMB, TROPONINI, MYOGLOBIN,  in the last 168 hours  No components found with this basename: POCBNP,  No results found for this basename: DDIMER,  in the last 72 hours No results found for this basename: HGBA1C,  in the last 72 hours No results found for this basename: CHOL, HDL, LDLCALC, TRIG, CHOLHDL, LDLDIRECT,  in the  last 72 hours No results found for this basename: TSH, T4TOTAL, FREET3, T3FREE, THYROIDAB,  in the last 72 hours No results found for this basename: VITAMINB12, FOLATE, FERRITIN, TIBC, IRON, RETICCTPCT,  in the last 72 hours No results found for this basename: LIPASE, AMYLASE,  in the last 72 hours  Urine Studies No results found for this basename: UACOL, UAPR, USPG, UPH, UTP, UGL, UKET, UBIL, UHGB, UNIT, UROB, ULEU, UEPI, UWBC, URBC, UBAC, CAST, CRYS, UCOM, BILUA,  in the last 72 hours  MICROBIOLOGY: No results found for this or any previous visit (from the past 240 hour(s)).  RADIOLOGY STUDIES/RESULTS: Dg Lumbar Spine Complete  03/19/2014   CLINICAL DATA:  Low back pain post trauma  EXAM: LUMBAR SPINE - COMPLETE 4+ VIEW  COMPARISON:  Jan 18, 2010  FINDINGS: Frontal, lateral, spot lumbosacral lateral, and bilateral oblique views were obtained. There are 5 non-rib-bearing lumbar type vertebral bodies. There is lumbar dextroscoliosis. There is no fracture or spondylolisthesis. Disc spaces appear intact. There is no appreciable facet arthropathy.  IMPRESSION: Scoliosis.  No fracture or appreciable arthropathy.   Electronically Signed   By: Lowella Grip M.D.   On: 03/19/2014 08:10   Ct Head Wo Contrast  03/18/2014   CLINICAL DATA:  Altered mental status, unresponsive  EXAM: CT HEAD WITHOUT CONTRAST  CT CERVICAL SPINE WITHOUT CONTRAST  TECHNIQUE: Multidetector CT imaging of the head and cervical spine was performed following the standard protocol without intravenous contrast. Multiplanar CT image reconstructions of the cervical spine were also generated.  COMPARISON:  None.  FINDINGS: CT HEAD FINDINGS  There is no evidence of mass effect, midline shift or extra-axial fluid collections. There is no evidence of a space-occupying lesion or intracranial hemorrhage. There is no evidence of a cortical-based area of acute infarction.  The ventricles and sulci are appropriate for the patient's age. The basal  cisterns are patent.  Visualized portions of the orbits are unremarkable. The visualized portions of the paranasal sinuses and mastoid air cells are unremarkable.  The osseous structures are unremarkable.  There is a nasal airway present.  CT CERVICAL SPINE FINDINGS  The alignment is anatomic. The vertebral body heights are maintained. There is no acute fracture. There is no static listhesis. The prevertebral soft tissues are normal. The intraspinal soft tissues are not fully imaged on this examination due to poor soft tissue contrast, but there is no gross soft tissue abnormality.  The disc spaces are maintained.  The visualized portions of the lung apices demonstrate no focal abnormality.  IMPRESSION: 1. No acute intracranial pathology. 2. No acute osseous injury of the cervical spine.   Electronically Signed   By: Kathreen Devoid   On: 03/18/2014 19:48   Ct Cervical Spine Wo Contrast  03/18/2014   CLINICAL DATA:  Altered mental  status, unresponsive  EXAM: CT HEAD WITHOUT CONTRAST  CT CERVICAL SPINE WITHOUT CONTRAST  TECHNIQUE: Multidetector CT imaging of the head and cervical spine was performed following the standard protocol without intravenous contrast. Multiplanar CT image reconstructions of the cervical spine were also generated.  COMPARISON:  None.  FINDINGS: CT HEAD FINDINGS  There is no evidence of mass effect, midline shift or extra-axial fluid collections. There is no evidence of a space-occupying lesion or intracranial hemorrhage. There is no evidence of a cortical-based area of acute infarction.  The ventricles and sulci are appropriate for the patient's age. The basal cisterns are patent.  Visualized portions of the orbits are unremarkable. The visualized portions of the paranasal sinuses and mastoid air cells are unremarkable.  The osseous structures are unremarkable.  There is a nasal airway present.  CT CERVICAL SPINE FINDINGS  The alignment is anatomic. The vertebral body heights are maintained.  There is no acute fracture. There is no static listhesis. The prevertebral soft tissues are normal. The intraspinal soft tissues are not fully imaged on this examination due to poor soft tissue contrast, but there is no gross soft tissue abnormality.  The disc spaces are maintained.  The visualized portions of the lung apices demonstrate no focal abnormality.  IMPRESSION: 1. No acute intracranial pathology. 2. No acute osseous injury of the cervical spine.   Electronically Signed   By: Kathreen Devoid   On: 03/18/2014 19:48    Oren Binet, MD  [facility] Pager:[phone]  If 7PM-7AM, please contact night-coverage [email or web address] Password [email or web address] 03/19/2014, 9:01 AM   LOS: 1 day   **Disclaimer: This note may have been dictated with voice recognition software. Similar sounding words can inadvertently be transcribed and this note may contain transcription errors which may not have been corrected upon publication of note.**

## 2014-03-20 ENCOUNTER — Observation Stay (HOSPITAL_COMMUNITY): Payer: 59

## 2014-03-20 MED ORDER — POTASSIUM CHLORIDE CRYS ER 20 MEQ PO TBCR
40.0000 meq | EXTENDED_RELEASE_TABLET | Freq: Once | ORAL | Status: AC
  Administered 2014-03-20: 40 meq via ORAL
  Filled 2014-03-20: qty 2

## 2014-03-20 MED ORDER — ACETAMINOPHEN 325 MG PO TABS: 650.0000 mg | ORAL_TABLET | Freq: Four times a day (QID) | ORAL | Status: DC | PRN

## 2014-03-20 NOTE — Progress Notes (Signed)
Subjective: No further seizure activity.  No new complaints.    Objective: Current vital signs: BP 143/84  Pulse 77  Temp(Src) 98.6 F (37 C) (Oral)  Resp 20  Ht 5\' 2"  (1.575 m)  Wt 61.78 kg (136 lb 3.2 oz)  BMI 24.91 kg/m2  SpO2 100% Vital signs in last 24 hours: Temp:  [97.9 F (36.6 C)-98.6 F (37 C)] 98.6 F (37 C) (07/01 1511) Pulse Rate:  [66-78] 77 (07/01 1511) Resp:  [18-20] 20 (07/01 1511) BP: (125-143)/(74-85) 143/84 mmHg (07/01 1511) SpO2:  [97 %-100 %] 100 % (07/01 1511) Weight:  [61.78 kg (136 lb 3.2 oz)] 61.78 kg (136 lb 3.2 oz) (07/01 0500)  Intake/Output from previous day: 06/30 0701 - 07/01 0700 In: 480 [P.O.:480] Out: -  Intake/Output this shift: Total I/O In: 240 [P.O.:240] Out: -  Nutritional status: General  Neurologic Exam: Mental Status:  Alert, oriented, thought content appropriate. Speech fluent without evidence of aphasia. Able to follow 3 step commands without difficulty.  Cranial Nerves:  II: Visual fields grossly normal, pupils equal, round, reactive to light and accommodation  III,IV, VI: ptosis not present, extra-ocular motions intact bilaterally  V,VII: smile symmetric, facial light touch sensation decreased on the left  VIII: hearing normal bilaterally  IX,X: gag reflex present  XI: bilateral shoulder shrug  XII: midline tongue extension without atrophy or fasciculations  Motor:  5/5 throughout Sensory: Pinprick and light touch decreased on the left arm and leg  Deep Tendon Reflexes:  2+ throughout Plantars:  Right: downgoing Left: downgoing  Cerebellar:  normal finger-to-nose, normal heel-to-shin test on the right and slow to perform      Lab Results: Basic Metabolic Panel:  Recent Labs Lab 03/18/14 1620 03/19/14 0536  NA 137 138  K 3.8 3.4*  CL 95* 99  CO2 25 27  GLUCOSE 131* 112*  BUN 12 8  CREATININE 0.80 0.64  CALCIUM 9.8 8.8  MG 2.2  --   PHOS 3.8  --     Liver Function Tests:  Recent Labs Lab  03/18/14 1620 03/19/14 0536  AST 15 19  ALT 11 10  ALKPHOS 76 64  BILITOT 0.2* 0.3  PROT 7.8 6.6  ALBUMIN 4.7 3.8   No results found for this basename: LIPASE, AMYLASE,  in the last 168 hours No results found for this basename: AMMONIA,  in the last 168 hours  CBC:  Recent Labs Lab 03/18/14 1620 03/19/14 0536  WBC 15.3* 12.9*  NEUTROABS 13.1*  --   HGB 14.5 12.6  HCT 43.1 37.7  MCV 95.1 95.0  PLT 337 324    Cardiac Enzymes: No results found for this basename: CKTOTAL, CKMB, CKMBINDEX, TROPONINI,  in the last 168 hours  Lipid Panel: No results found for this basename: CHOL, TRIG, HDL, CHOLHDL, VLDL, LDLCALC,  in the last 168 hours  CBG: No results found for this basename: GLUCAP,  in the last 168 hours  Microbiology: Results for orders placed during the hospital encounter of 01/08/10  CLOSTRIDIUM DIFFICILE EIA     Status: None   Collection Time    01/09/10 12:28 AM      Result Value Ref Range Status   Specimen Description STOOL   Final   Special Requests IMMUNE:NORM   Final   C difficile Toxins A+B, EIA NEGATIVE   Final   Report Status 01/09/2010 FINAL   Final  CLOSTRIDIUM DIFFICILE EIA     Status: None   Collection Time    01/10/10 10:08  AM      Result Value Ref Range Status   Specimen Description STOOL   Final   Special Requests NONE   Final   C difficile Toxins A+B, EIA NEGATIVE   Final   Report Status 01/11/2010 FINAL   Final    Coagulation Studies:  Recent Labs  03/19/14 0536  LABPROT 13.7  INR 1.05    Imaging: Dg Lumbar Spine Complete  03/19/2014   CLINICAL DATA:  Low back pain post trauma  EXAM: LUMBAR SPINE - COMPLETE 4+ VIEW  COMPARISON:  Jan 18, 2010  FINDINGS: Frontal, lateral, spot lumbosacral lateral, and bilateral oblique views were obtained. There are 5 non-rib-bearing lumbar type vertebral bodies. There is lumbar dextroscoliosis. There is no fracture or spondylolisthesis. Disc spaces appear intact. There is no appreciable facet  arthropathy.  IMPRESSION: Scoliosis.  No fracture or appreciable arthropathy.   Electronically Signed   By: Lowella Grip M.D.   On: 03/19/2014 08:10   Ct Head Wo Contrast  03/18/2014   CLINICAL DATA:  Altered mental status, unresponsive  EXAM: CT HEAD WITHOUT CONTRAST  CT CERVICAL SPINE WITHOUT CONTRAST  TECHNIQUE: Multidetector CT imaging of the head and cervical spine was performed following the standard protocol without intravenous contrast. Multiplanar CT image reconstructions of the cervical spine were also generated.  COMPARISON:  None.  FINDINGS: CT HEAD FINDINGS  There is no evidence of mass effect, midline shift or extra-axial fluid collections. There is no evidence of a space-occupying lesion or intracranial hemorrhage. There is no evidence of a cortical-based area of acute infarction.  The ventricles and sulci are appropriate for the patient's age. The basal cisterns are patent.  Visualized portions of the orbits are unremarkable. The visualized portions of the paranasal sinuses and mastoid air cells are unremarkable.  The osseous structures are unremarkable.  There is a nasal airway present.  CT CERVICAL SPINE FINDINGS  The alignment is anatomic. The vertebral body heights are maintained. There is no acute fracture. There is no static listhesis. The prevertebral soft tissues are normal. The intraspinal soft tissues are not fully imaged on this examination due to poor soft tissue contrast, but there is no gross soft tissue abnormality.  The disc spaces are maintained.  The visualized portions of the lung apices demonstrate no focal abnormality.  IMPRESSION: 1. No acute intracranial pathology. 2. No acute osseous injury of the cervical spine.   Electronically Signed   By: Kathreen Devoid   On: 03/18/2014 19:48   Ct Cervical Spine Wo Contrast  03/18/2014   CLINICAL DATA:  Altered mental status, unresponsive  EXAM: CT HEAD WITHOUT CONTRAST  CT CERVICAL SPINE WITHOUT CONTRAST  TECHNIQUE: Multidetector  CT imaging of the head and cervical spine was performed following the standard protocol without intravenous contrast. Multiplanar CT image reconstructions of the cervical spine were also generated.  COMPARISON:  None.  FINDINGS: CT HEAD FINDINGS  There is no evidence of mass effect, midline shift or extra-axial fluid collections. There is no evidence of a space-occupying lesion or intracranial hemorrhage. There is no evidence of a cortical-based area of acute infarction.  The ventricles and sulci are appropriate for the patient's age. The basal cisterns are patent.  Visualized portions of the orbits are unremarkable. The visualized portions of the paranasal sinuses and mastoid air cells are unremarkable.  The osseous structures are unremarkable.  There is a nasal airway present.  CT CERVICAL SPINE FINDINGS  The alignment is anatomic. The vertebral body heights are maintained. There  is no acute fracture. There is no static listhesis. The prevertebral soft tissues are normal. The intraspinal soft tissues are not fully imaged on this examination due to poor soft tissue contrast, but there is no gross soft tissue abnormality.  The disc spaces are maintained.  The visualized portions of the lung apices demonstrate no focal abnormality.  IMPRESSION: 1. No acute intracranial pathology. 2. No acute osseous injury of the cervical spine.   Electronically Signed   By: Kathreen Devoid   On: 03/18/2014 19:48    Medications:  I have reviewed the patient's current medications. Scheduled: . enoxaparin (LOVENOX) injection  40 mg Subcutaneous Q24H  . sodium chloride  3 mL Intravenous Q12H    Assessment/Plan: Patient back to baseline.  No further seizures noted.  EEG unremarkable.  MRI being performed at this time.  Suspect that seizure provoked by medications.  Recommendations: 1.  Will follow up results of MRI.  If no significant abnormalities noted, anticonvulsant therapy not indicated at this time.   2.  Patient unable  to drive, operate heavy machinery, perform activities at heights and participate in water activities until release by outpatient physician.   LOS: 2 days   Alexis Goodell, MD [facility] [phone] 03/20/2014  5:53 PM

## 2014-03-20 NOTE — Progress Notes (Signed)
Radiology called regarding pt's MRI, scheduled for today. Radiology unable to give an exact time for the procedure at this time. Pt aware.

## 2014-03-20 NOTE — Discharge Summary (Signed)
Physician Discharge Summary  [name] GGE:[id number] DOB: 1965-12-12 DOA: 03/18/2014  PCP: No primary provider on file.  Admit date: 03/18/2014 Discharge date: 03/20/2014  Time spent: 45 minutes  Recommendations for Outpatient Follow-up:  1. FU with PCP in 1 week 2. Fu with Dr.Yan in 2 weeks 3. No driving, operating machinery till cleared by PCP or NEurologist  Discharge Diagnoses:  Principal Problem:   Seizure Active Problems:   Depression   Chronic back pain   Discharge Condition: stable  Diet recommendation: regular  Filed Weights   03/19/14 0503 03/20/14 0500  Weight: 61.462 kg (135 lb 8 oz) 61.78 kg (136 lb 3.2 oz)    History of present illness:  [name] is a 48 y.o. female with Past medical history of migraines, depression, chronic back pain.  Patient presented with complaints of seizure-like activity. She mentions that she had a headache all day. Because of the headache she went out to smoke and was taking tramadol and Tylenol. When she came back she felt dizzy lightheaded and sat on the ground struck her head. After that she started having seizure-like activity. This was witnessed by the patient's daughter. She mentions she had full tonic-clonic activity without any tongue bite or incontinence.  This she had a history of seizure-like activity in the past once.  Pt denies any fever, chills, headache, cough, chest pain, palpitation, shortness of breath, orthopnea, PND, nausea, vomiting, abdominal pain, diarrhea, constipation, active bleeding, burning urination, dizziness, pedal edema.  Complains of left-sided back pain and generalized weakness on left side with numbness  Hospital Course:  Seizure  -admitted with second episode of seizure. Current suspicion, is that it may have been provoked by use of Tramadol.  -Seen by Neurology, EEG normal, MRI normal -Per NEuro, no further AEDs recommended at this time   Left Sided weakness  -mild, possibly Todd paralysis vs  functional -improved -MRI Brain normal, if neg for CVA-will not require any further work up  -strong functional component suspected due to inconsistencies noted especially by PT and OT  Chronic Back Pain  -c/w supportive care, advised to stop Tramadol and use Tylenol  Hx of Migraine  -supportive care   Hx of Anxiety  -c/w Klonopin   Procedures:  EEG normal  MRI pending  Consultations:  Neurology  Discharge Exam: Filed Vitals:   03/20/14 1511  BP: 143/84  Pulse: 77  Temp: 98.6 F (37 C)  Resp: 20    General: AAOx3 Cardiovascular: S1S2/RRR Respiratory: CTAB  Discharge Instructions You were cared for by a hospitalist during your hospital stay. If you have any questions about your discharge medications or the care you received while you were in the hospital after you are discharged, you can call the unit and asked to speak with the hospitalist on call if the hospitalist that took care of you is not available. Once you are discharged, your primary care physician will handle any further medical issues. Please note that NO REFILLS for any discharge medications will be authorized once you are discharged, as it is imperative that you return to your primary care physician (or establish a relationship with a primary care physician if you do not have one) for your aftercare needs so that they can reassess your need for medications and monitor your lab values.     Medication List    ASK your doctor about these medications       calcium-vitamin D 500-200 MG-UNIT per tablet  Commonly known as:  OSCAL  WITH D  Take 1 tablet by mouth daily with breakfast.     carisoprodol 350 MG tablet  Commonly known as:  SOMA  Take 350 mg by mouth 4 (four) times daily as needed for muscle spasms.     clonazePAM 1 MG tablet  Commonly known as:  KLONOPIN  Take 1 mg by mouth 3 (three) times daily as needed for anxiety (sleep).     diphenhydrAMINE 25 MG tablet  Commonly known as:  SOMINEX   Take 25 mg by mouth at bedtime as needed for sleep.     multivitamin per tablet  Take 1 tablet by mouth daily.     TRAMADOL HCL PO  Take by mouth.       Allergies  Allergen Reactions  . Aspirin Anaphylaxis  . Doxycycline Anaphylaxis  . Sulfa Antibiotics Anaphylaxis  . Imitrex [Sumatriptan Base] Other (See Comments)    Blood Pressure goes high  . Iodinated Diagnostic Agents   . Levofloxacin Other (See Comments)    Joints hurt  . Prednisone Other (See Comments)    She goes crazy  . Latex Rash  . Nsaids Rash      The results of significant diagnostics from this hospitalization (including imaging, microbiology, ancillary and laboratory) are listed below for reference.    Significant Diagnostic Studies: Dg Lumbar Spine Complete  03/19/2014   CLINICAL DATA:  Low back pain post trauma  EXAM: LUMBAR SPINE - COMPLETE 4+ VIEW  COMPARISON:  Jan 18, 2010  FINDINGS: Frontal, lateral, spot lumbosacral lateral, and bilateral oblique views were obtained. There are 5 non-rib-bearing lumbar type vertebral bodies. There is lumbar dextroscoliosis. There is no fracture or spondylolisthesis. Disc spaces appear intact. There is no appreciable facet arthropathy.  IMPRESSION: Scoliosis.  No fracture or appreciable arthropathy.   Electronically Signed   By: Lowella Grip M.D.   On: 03/19/2014 08:10   Ct Head Wo Contrast  03/18/2014   CLINICAL DATA:  Altered mental status, unresponsive  EXAM: CT HEAD WITHOUT CONTRAST  CT CERVICAL SPINE WITHOUT CONTRAST  TECHNIQUE: Multidetector CT imaging of the head and cervical spine was performed following the standard protocol without intravenous contrast. Multiplanar CT image reconstructions of the cervical spine were also generated.  COMPARISON:  None.  FINDINGS: CT HEAD FINDINGS  There is no evidence of mass effect, midline shift or extra-axial fluid collections. There is no evidence of a space-occupying lesion or intracranial hemorrhage. There is no evidence of  a cortical-based area of acute infarction.  The ventricles and sulci are appropriate for the patient's age. The basal cisterns are patent.  Visualized portions of the orbits are unremarkable. The visualized portions of the paranasal sinuses and mastoid air cells are unremarkable.  The osseous structures are unremarkable.  There is a nasal airway present.  CT CERVICAL SPINE FINDINGS  The alignment is anatomic. The vertebral body heights are maintained. There is no acute fracture. There is no static listhesis. The prevertebral soft tissues are normal. The intraspinal soft tissues are not fully imaged on this examination due to poor soft tissue contrast, but there is no gross soft tissue abnormality.  The disc spaces are maintained.  The visualized portions of the lung apices demonstrate no focal abnormality.  IMPRESSION: 1. No acute intracranial pathology. 2. No acute osseous injury of the cervical spine.   Electronically Signed   By: Kathreen Devoid   On: 03/18/2014 19:48   Ct Cervical Spine Wo Contrast  03/18/2014   CLINICAL DATA:  Altered mental  status, unresponsive  EXAM: CT HEAD WITHOUT CONTRAST  CT CERVICAL SPINE WITHOUT CONTRAST  TECHNIQUE: Multidetector CT imaging of the head and cervical spine was performed following the standard protocol without intravenous contrast. Multiplanar CT image reconstructions of the cervical spine were also generated.  COMPARISON:  None.  FINDINGS: CT HEAD FINDINGS  There is no evidence of mass effect, midline shift or extra-axial fluid collections. There is no evidence of a space-occupying lesion or intracranial hemorrhage. There is no evidence of a cortical-based area of acute infarction.  The ventricles and sulci are appropriate for the patient's age. The basal cisterns are patent.  Visualized portions of the orbits are unremarkable. The visualized portions of the paranasal sinuses and mastoid air cells are unremarkable.  The osseous structures are unremarkable.  There is a  nasal airway present.  CT CERVICAL SPINE FINDINGS  The alignment is anatomic. The vertebral body heights are maintained. There is no acute fracture. There is no static listhesis. The prevertebral soft tissues are normal. The intraspinal soft tissues are not fully imaged on this examination due to poor soft tissue contrast, but there is no gross soft tissue abnormality.  The disc spaces are maintained.  The visualized portions of the lung apices demonstrate no focal abnormality.  IMPRESSION: 1. No acute intracranial pathology. 2. No acute osseous injury of the cervical spine.   Electronically Signed   By: Kathreen Devoid   On: 03/18/2014 19:48    Microbiology: No results found for this or any previous visit (from the past 240 hour(s)).   Labs: Basic Metabolic Panel:  Recent Labs Lab 03/18/14 1620 03/19/14 0536  NA 137 138  K 3.8 3.4*  CL 95* 99  CO2 25 27  GLUCOSE 131* 112*  BUN 12 8  CREATININE 0.80 0.64  CALCIUM 9.8 8.8  MG 2.2  --   PHOS 3.8  --    Liver Function Tests:  Recent Labs Lab 03/18/14 1620 03/19/14 0536  AST 15 19  ALT 11 10  ALKPHOS 76 64  BILITOT 0.2* 0.3  PROT 7.8 6.6  ALBUMIN 4.7 3.8   No results found for this basename: LIPASE, AMYLASE,  in the last 168 hours No results found for this basename: AMMONIA,  in the last 168 hours CBC:  Recent Labs Lab 03/18/14 1620 03/19/14 0536  WBC 15.3* 12.9*  NEUTROABS 13.1*  --   HGB 14.5 12.6  HCT 43.1 37.7  MCV 95.1 95.0  PLT 337 324   Cardiac Enzymes: No results found for this basename: CKTOTAL, CKMB, CKMBINDEX, TROPONINI,  in the last 168 hours BNP: BNP (last 3 results) No results found for this basename: PROBNP,  in the last 8760 hours CBG: No results found for this basename: GLUCAP,  in the last 168 hours     Signed:  Sylvanna Burggraf  [facility] 03/20/2014, 3:49 PM

## 2014-03-20 NOTE — Evaluation (Signed)
Occupational Therapy Evaluation Patient Details Name: [name] MRN: [id number] DOB: January 17, 1966 Today's Date: 03/20/2014    History of Present Illness [name] is a 48 y.o. female with Past medical history of migraines, depression, chronic back pain. Patient presented 6/29 with complaints of seizure-like activity. She mentions that she had a headache all day. Because of the headache she went out to smoke and was taking tramadol and Tylenol. When she came back she felt dizzy lightheaded and sat on the ground struck her head. After that she started having seizure-like activity. This was witnessed by the patient's daughter. She mentions she had full tonic-clonic activity without any tongue bite or incontinence. Complains of left-sided back pain and generalized weakness on left side with numbness.   Clinical Impression   Pt admitted with above. She demonstrates the below listed deficits and will benefit from continued OT to maximize safety and independence with BADLs.  Pt presents to OT with inconsistencies throughout evaluation.  Pt will use Lt UE spontaneously during function and demonstrates isolated full AROM, but when AROM formally assessed, she was unable to fully flex Lt. UE and movements were very slow and deliberate.  She was also noted to "leave" Lt UE behind her as if neglecting it during LB ADL tasks, but later spontaneously used and incorporated it into activity without difficulty.  Pt with no balance loss during functional tasks, and when distracted, gait pattern normalized.  She missed items consistently in inferior quadrants bil. During visual field testing, but was able to read a full newspaper article without difficulty and was able to scan environment without missing items in the inferior quadrants.   Pt is able to perform BADLs at supervision level today, including stepping over the tub.   Pt states she plans to return work tomorrow. Will see pt acutely, but no follow up OT  recommended at discharge.          No OT follow up    Equipment Recommendations  None recommended by OT    Recommendations for Other Services       Precautions / Restrictions Precautions Precautions: Fall Restrictions Weight Bearing Restrictions: No      Mobility Bed Mobility Overal bed mobility: Modified Independent                Transfers Overall transfer level: Needs assistance Equipment used: Rolling walker (2 wheeled) Transfers: Sit to/from [facility] Sit to Stand: Supervision Stand pivot transfers: Supervision            Balance Overall balance assessment: No apparent balance deficits (not formally assessed)                                          ADL Overall ADL's : Needs assistance/impaired Eating/Feeding: Modified independent;Sitting   Grooming: Wash/dry hands;Wash/dry face;Oral care;Brushing hair;Standing;Supervision/safety   Upper Body Bathing: Set up;Sitting   Lower Body Bathing: Supervison/ safety;Sit to/from stand   Upper Body Dressing : Supervision/safety;Sitting;Set up   Lower Body Dressing: Supervision/safety;Sit to/from stand Lower Body Dressing Details (indicate cue type and reason): Pt donned/doffed socks with Rt. UE only  Toilet Transfer: Supervision/safety;Ambulation;Comfort height toilet   Toileting- Clothing Manipulation and Hygiene: Supervision/safety;Sit to/from stand   Tub/ Shower Transfer: Supervision/safety;Ambulation Tub/Shower Transfer Details (indicate cue type and reason): Pt paced up and down the side of the tub (with normal gait pattern) trying to figure out  best method for stepping into tub.  She asked if she could sit on the edge of tub, but was instructed to step over the top.  Pt lifted Lt. LE slowly and deliberately, moved it over the ledge of the tub, then slowly and in a controlled fashion sat on the edge of the tub and swung the other leg in.  She then stood from low  surface without LOB or difficulty.  She turned and stepped out of tub with supervision.   Functional mobility during ADLs: Supervision/safety;Rolling walker General ADL Comments: Pt will incorporate Lt. UE into activities spontaneously.  She is frustrated with hospitalization and fixates on the gel in her hair and her IV in Lt. hand, frequently Raising Lt. UE to inspect IV site.  Pt states she is eager to discharge home, and is eager to return to work - she states she will go back to work tomorrow.      Vision Eye Alignment: Within Functional Limits Alignment/Gaze Preference: Within Defined Limits Ocular Range of Motion: Within Functional Limits Tracking/Visual Pursuits: Able to track stimulus in all quads without difficulty         Additional Comments: Pt missed items in bil. inferior quadrants, but noted to locate items in environment without difficulty and was able to read newspaper article independently without difficulty    Perception Perception Spatial deficits: Pt noted to keep Lt. UE behind her as if neglecting it, but then used it spontaneously throughout session.  Inconsistent responses   Praxis Praxis Praxis tested?: Within functional limits    Pertinent Vitals/Pain See vitals flow sheet     Hand Dominance Right   Extremity/Trunk Assessment Upper Extremity Assessment Upper Extremity Assessment: LUE deficits/detail LUE Deficits / Details: Pt noted to spontaneously use Lt. UE to manipulate blankets, heart monitor, manipulate gown, etc.  but when asked to move Lt. UE, movement is very slow and deliberate with shoulder flexion ~110*.  AROM elbow distally WFL, but pt moves slowly.  When pt asked to move Lt. UE behind her back "like your tucking in your shirt", pt did so, but then left UE behind her for ~8 mins with no attempt to use it while donning socks.  When she moved to standing, she spontaneously used it to push up off bed and reached for RW with it, then proceeded to use  it during remainder of session            Communication Communication Communication: No difficulties   Cognition Arousal/Alertness: Awake/alert Behavior During Therapy: WFL for tasks assessed/performed Overall Cognitive Status: Within Functional Limits for tasks assessed                     General Comments       Exercises       Shoulder Instructions      Home Living Family/patient expects to be discharged to:: Private residence Living Arrangements: Non-relatives/Friends (roommate) Available Help at Discharge: Friend(s);Family;Available PRN/intermittently Type of Home: House Home Access: Stairs to enter [employer] of Steps: 3 Entrance Stairs-Rails: None Home Layout: Two level Alternate Level Stairs-Number of Steps: 12 Alternate Level Stairs-Rails: Left Bathroom Shower/Tub: Tub/shower unit;Curtain Shower/tub characteristics: Architectural technologist: Standard     Home Equipment: None          Prior Functioning/Environment Level of Independence: Independent        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    OT Diagnosis: Generalized weakness;Acute pain   OT Problem List: Decreased strength;Decreased activity  tolerance;Impaired vision/perception;Decreased coordination;Decreased knowledge of use of DME or AE;Pain;Impaired UE functional use   OT Treatment/Interventions: Self-care/ADL training;Neuromuscular education;DME and/or AE instruction;Therapeutic activities;Patient/family education    OT Goals(Current goals can be found in the care plan section) Acute Rehab OT Goals Patient Stated Goal: to go home OT Goal Formulation: With patient Time For Goal Achievement: 03/27/14 Potential to Achieve Goals: Good ADL Goals Pt Will Perform Grooming: Independently;standing Pt Will Perform Upper Body Bathing: Independently;sitting;standing Pt Will Perform Lower Body Bathing: Independently;sit to/from stand Pt Will Perform Upper Body Dressing:  Independently;sitting Pt Will Perform Lower Body Dressing: Independently;sit to/from stand Pt Will Transfer to Toilet: Independently;regular height toilet;ambulating Pt Will Perform Toileting - Clothing Manipulation and hygiene: Independently;sit to/from stand  OT Frequency: Min 2X/week   Barriers to D/C: Decreased caregiver support          Co-evaluation              End of Session Equipment Utilized During Treatment: Rolling walker Nurse Communication: Mobility status  Activity Tolerance: Patient tolerated treatment well Patient left: in bed;with call bell/phone within reach;with bed alarm set   Time: 0737-1062 OT Time Calculation (min): 38 min Charges:  OT General Charges $OT Visit: 1 Procedure OT Evaluation $Initial OT Evaluation Tier I: 1 Procedure OT Treatments $Self Care/Home Management : 23-37 mins G-Codes: OT G-codes **NOT FOR INPATIENT CLASS** Functional Limitation: Self care Self Care Current Status (I9485): At least 1 percent but less than 20 percent impaired, limited or restricted Self Care Goal Status (I6270): 0 percent impaired, limited or restricted  Chandon Lazcano M 03/20/2014, 11:16 AM

## 2014-03-20 NOTE — Progress Notes (Signed)
Physical Therapy Treatment Patient Details Name: SUMMERS BUENDIA MRN: [id number] DOB: Dec 01, 1965 Today's Date: 03/20/2014    History of Present Illness ELLEAN FIRMAN is a 48 y.o. female with Past medical history of migraines, depression, chronic back pain. Patient presented 6/29 with complaints of seizure-like activity. She mentions that she had a headache all day. Because of the headache she went out to smoke and was taking tramadol and Tylenol. When she came back she felt dizzy lightheaded and sat on the ground struck her head. After that she started having seizure-like activity. This was witnessed by the patient's daughter. She mentions she had full tonic-clonic activity without any tongue bite or incontinence. Complains of left-sided back pain and generalized weakness on left side with numbness.    PT Comments    Pt much improved this date. Pt able to amb 600' without LOB and able to complete stair negotiation. Pt strongly desires to go home and return to work despite back pain and altered sensation t/o L UE/LE. Pt no longer would benefit from CIR upon d/c and is safe to d/c home once medically stable with intermittent supervision.  Follow Up Recommendations  No PT follow up;Supervision/Assistance - intermittent     Equipment Recommendations  None recommended by PT    Recommendations for Other Services       Precautions / Restrictions Precautions Precautions: Fall Restrictions Weight Bearing Restrictions: No    Mobility  Bed Mobility Overal bed mobility: Modified Independent                Transfers Overall transfer level: Needs assistance Equipment used: None Transfers: Sit to/from Stand Sit to Stand: Supervision         General transfer comment: pt with no instability with transfer  Ambulation/Gait Ambulation/Gait assistance: Min guard Ambulation Distance (Feet): 600 Feet Assistive device: None Gait Pattern/deviations: Step-through pattern;Decreased stride  length;Antalgic Gait velocity: decreased Gait velocity interpretation: Below normal speed for age/gender General Gait Details: pt with antalgic L LE limp due to back pain and L LE pain per pt report. no episodes of LOB   Stairs Stairs: Yes Stairs assistance: Min guard Stair Management: Forwards;One rail Right Number of Stairs: 6 General stair comments: v/c's for sequencing "up with good, down with the weak." Pt with no instability. Relied on R UE and hand rail'  Wheelchair Mobility    Modified Rankin (Stroke Patients Only)       Balance               Standing balance comment: pt much improved this date and was able to stand and move tray table independently without LOB                    Cognition Arousal/Alertness: Awake/alert Behavior During Therapy: WFL for tasks assessed/performed Overall Cognitive Status: Within Functional Limits for tasks assessed                      Exercises      General Comments        Pertinent Vitals/Pain Back and L LE pain, pt reports "It hurts like hati"    Home Living                      Prior Function            PT Goals (current goals can now be found in the care plan section) Acute Rehab PT Goals Patient Stated Goal: go home  today Progress towards PT goals: Progressing toward goals    Frequency  Min 2X/week    PT Plan Frequency needs to be updated    Co-evaluation             End of Session Equipment Utilized During Treatment: Gait belt Activity Tolerance: Patient tolerated treatment well Patient left: in bed;with call bell/phone within reach     Time: 1445-1500 PT Time Calculation (min): 15 min  Charges:  $Gait Training: 8-22 mins                    G Codes:      Kingsley Callander 03/20/2014, 3:31 PM  Kittie Plater, PT, DPT Pager #: [phone] Office #: [phone]

## 2014-03-20 NOTE — Progress Notes (Signed)
Pt gone to Radiology for her MRI at 1719. MD informed.

## 2014-04-07 ENCOUNTER — Emergency Department (HOSPITAL_COMMUNITY)
Admission: EM | Admit: 2014-04-07 | Discharge: 2014-04-07 | Disposition: A | Discharge status: Home / Self Care | Payer: 59 | Attending: Emergency Medicine | Admitting: Emergency Medicine

## 2014-04-07 ENCOUNTER — Emergency Department (HOSPITAL_COMMUNITY): Payer: 59

## 2014-04-07 ENCOUNTER — Encounter (HOSPITAL_COMMUNITY): Payer: Self-pay | Admitting: Emergency Medicine

## 2014-04-07 DIAGNOSIS — F411 Generalized anxiety disorder: Secondary | ICD-10-CM | POA: Insufficient documentation

## 2014-04-07 DIAGNOSIS — Z8742 Personal history of other diseases of the female genital tract: Secondary | ICD-10-CM | POA: Insufficient documentation

## 2014-04-07 DIAGNOSIS — F17200 Nicotine dependence, unspecified, uncomplicated: Secondary | ICD-10-CM | POA: Insufficient documentation

## 2014-04-07 DIAGNOSIS — Y9389 Activity, other specified: Secondary | ICD-10-CM | POA: Insufficient documentation

## 2014-04-07 DIAGNOSIS — Z79899 Other long term (current) drug therapy: Secondary | ICD-10-CM | POA: Insufficient documentation

## 2014-04-07 DIAGNOSIS — R569 Unspecified convulsions: Secondary | ICD-10-CM

## 2014-04-07 DIAGNOSIS — G43909 Migraine, unspecified, not intractable, without status migrainosus: Secondary | ICD-10-CM | POA: Insufficient documentation

## 2014-04-07 DIAGNOSIS — Y92410 Unspecified street and highway as the place of occurrence of the external cause: Secondary | ICD-10-CM | POA: Insufficient documentation

## 2014-04-07 DIAGNOSIS — Z91040 Latex allergy status: Secondary | ICD-10-CM | POA: Insufficient documentation

## 2014-04-07 LAB — CBC WITH DIFFERENTIAL/PLATELET
BASOS ABS: 0 10*3/uL (ref 0.0–0.1)
Basophils Relative: 0 % (ref 0–1)
EOS PCT: 2 % (ref 0–5)
Eosinophils Absolute: 0.2 10*3/uL (ref 0.0–0.7)
HCT: 35.5 % — ABNORMAL LOW (ref 36.0–46.0)
Hemoglobin: 11.8 g/dL — ABNORMAL LOW (ref 12.0–15.0)
Lymphocytes Relative: 56 % — ABNORMAL HIGH (ref 12–46)
Lymphs Abs: 5.2 10*3/uL — ABNORMAL HIGH (ref 0.7–4.0)
MCH: 31.4 pg (ref 26.0–34.0)
MCHC: 33.2 g/dL (ref 30.0–36.0)
MCV: 94.4 fL (ref 78.0–100.0)
Monocytes Absolute: 0.5 10*3/uL (ref 0.1–1.0)
Monocytes Relative: 5 % (ref 3–12)
NEUTROS ABS: 3.5 10*3/uL (ref 1.7–7.7)
Neutrophils Relative %: 37 % — ABNORMAL LOW (ref 43–77)
Platelets: 321 10*3/uL (ref 150–400)
RBC: 3.76 MIL/uL — ABNORMAL LOW (ref 3.87–5.11)
RDW: 13 % (ref 11.5–15.5)
WBC: 9.4 10*3/uL (ref 4.0–10.5)

## 2014-04-07 LAB — URINALYSIS, ROUTINE W REFLEX MICROSCOPIC
BILIRUBIN URINE: NEGATIVE
Glucose, UA: NEGATIVE mg/dL
HGB URINE DIPSTICK: NEGATIVE
KETONES UR: NEGATIVE mg/dL
Leukocytes, UA: NEGATIVE
Nitrite: NEGATIVE
PROTEIN: NEGATIVE mg/dL
Specific Gravity, Urine: 1.01 (ref 1.005–1.030)
Urobilinogen, UA: 0.2 mg/dL (ref 0.0–1.0)
pH: 5 (ref 5.0–8.0)

## 2014-04-07 LAB — RAPID URINE DRUG SCREEN, HOSP PERFORMED
AMPHETAMINES: NOT DETECTED
Barbiturates: NOT DETECTED
Benzodiazepines: NOT DETECTED
Cocaine: NOT DETECTED
Opiates: POSITIVE — AB
TETRAHYDROCANNABINOL: NOT DETECTED

## 2014-04-07 LAB — COMPREHENSIVE METABOLIC PANEL
ALBUMIN: 3.7 g/dL (ref 3.5–5.2)
ALT: 12 U/L (ref 0–35)
AST: 20 U/L (ref 0–37)
Alkaline Phosphatase: 94 U/L (ref 39–117)
Anion gap: 17 — ABNORMAL HIGH (ref 5–15)
BUN: 11 mg/dL (ref 6–23)
CHLORIDE: 99 meq/L (ref 96–112)
CO2: 23 mEq/L (ref 19–32)
CREATININE: 0.68 mg/dL (ref 0.50–1.10)
Calcium: 8.6 mg/dL (ref 8.4–10.5)
GFR calc Af Amer: >90 mL/min (ref >90)
GFR calc non Af Amer: >90 mL/min (ref >90)
Glucose, Bld: 101 mg/dL — ABNORMAL HIGH (ref 70–99)
POTASSIUM: 3.5 meq/L — AB (ref 3.7–5.3)
SODIUM: 139 meq/L (ref 137–147)
Total Protein: 6.8 g/dL (ref 6.0–8.3)

## 2014-04-07 LAB — LIPASE, BLOOD: LIPASE: 17 U/L (ref 11–59)

## 2014-04-07 MED ORDER — PROMETHAZINE HCL 25 MG/ML IJ SOLN
12.5000 mg | Freq: Four times a day (QID) | INTRAMUSCULAR | Status: DC | PRN
  Administered 2014-04-07: 12.5 mg via INTRAVENOUS
  Filled 2014-04-07: qty 1

## 2014-04-07 MED ORDER — MORPHINE SULFATE 4 MG/ML IJ SOLN
4.0000 mg | Freq: Once | INTRAMUSCULAR | Status: AC
  Administered 2014-04-07: 4 mg via INTRAVENOUS
  Filled 2014-04-07: qty 1

## 2014-04-07 MED ORDER — ONDANSETRON HCL 4 MG/2ML IJ SOLN
4.0000 mg | Freq: Once | INTRAMUSCULAR | Status: AC
  Administered 2014-04-07: 4 mg via INTRAVENOUS
  Filled 2014-04-07: qty 2

## 2014-04-07 MED ORDER — LEVETIRACETAM 500 MG PO TABS: 500.0000 mg | ORAL_TABLET | Freq: Two times a day (BID) | ORAL | Status: DC

## 2014-04-07 MED ORDER — SODIUM CHLORIDE 0.9 % IV BOLUS (SEPSIS)
1000.0000 mL | Freq: Once | INTRAVENOUS | Status: AC
  Administered 2014-04-07: 1000 mL via INTRAVENOUS

## 2014-04-07 MED ORDER — LEVETIRACETAM IN NACL 1000 MG/100ML IV SOLN
1000.0000 mg | Freq: Once | INTRAVENOUS | Status: AC
  Administered 2014-04-07: 1000 mg via INTRAVENOUS
  Filled 2014-04-07: qty 100

## 2014-04-07 MED ORDER — OXYCODONE-ACETAMINOPHEN 5-325 MG PO TABS: 1.0000 | ORAL_TABLET | Freq: Four times a day (QID) | ORAL | Status: DC | PRN

## 2014-04-07 MED ORDER — PROCHLORPERAZINE EDISYLATE 5 MG/ML IJ SOLN
10.0000 mg | Freq: Four times a day (QID) | INTRAMUSCULAR | Status: DC | PRN
  Administered 2014-04-07: 10 mg via INTRAVENOUS
  Filled 2014-04-07: qty 2

## 2014-04-07 NOTE — ED Notes (Signed)
Pt comfortable with discharge and follow up instructions. Prescriptions x2. 

## 2014-04-07 NOTE — ED Provider Notes (Signed)
Medical screening examination/treatment/procedure(s) were performed by non-physician practitioner and as supervising physician I was immediately available for consultation/collaboration.   EKG Interpretation None        Hoy Morn, MD 04/07/14 2354

## 2014-04-07 NOTE — Discharge Instructions (Signed)
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
## 2014-04-08 LAB — ETHANOL: Alcohol, Ethyl (B): <11 mg/dL (ref 0–11)

## 2014-07-13 ENCOUNTER — Encounter (HOSPITAL_COMMUNITY): Payer: Self-pay | Admitting: Emergency Medicine

## 2014-07-13 ENCOUNTER — Inpatient Hospital Stay (HOSPITAL_COMMUNITY): Payer: Self-pay

## 2014-07-13 ENCOUNTER — Inpatient Hospital Stay (HOSPITAL_COMMUNITY)
Admission: EM | Admit: 2014-07-13 | Discharge: 2014-07-15 | DRG: 918 | Disposition: A | Discharge status: Home / Self Care | Payer: Self-pay | Attending: Internal Medicine | Admitting: Internal Medicine

## 2014-07-13 DIAGNOSIS — Z8249 Family history of ischemic heart disease and other diseases of the circulatory system: Secondary | ICD-10-CM

## 2014-07-13 DIAGNOSIS — D6959 Other secondary thrombocytopenia: Secondary | ICD-10-CM | POA: Diagnosis present

## 2014-07-13 DIAGNOSIS — R630 Anorexia: Secondary | ICD-10-CM | POA: Diagnosis present

## 2014-07-13 DIAGNOSIS — G40909 Epilepsy, unspecified, not intractable, without status epilepticus: Secondary | ICD-10-CM | POA: Diagnosis present

## 2014-07-13 DIAGNOSIS — R6510 Systemic inflammatory response syndrome (SIRS) of non-infectious origin without acute organ dysfunction: Secondary | ICD-10-CM | POA: Diagnosis not present

## 2014-07-13 DIAGNOSIS — E872 Acidosis, unspecified: Secondary | ICD-10-CM

## 2014-07-13 DIAGNOSIS — F17210 Nicotine dependence, cigarettes, uncomplicated: Secondary | ICD-10-CM | POA: Diagnosis present

## 2014-07-13 DIAGNOSIS — I959 Hypotension, unspecified: Secondary | ICD-10-CM | POA: Diagnosis present

## 2014-07-13 DIAGNOSIS — T391X1A Poisoning by 4-Aminophenol derivatives, accidental (unintentional), initial encounter: Secondary | ICD-10-CM | POA: Diagnosis present

## 2014-07-13 DIAGNOSIS — F329 Major depressive disorder, single episode, unspecified: Secondary | ICD-10-CM | POA: Diagnosis present

## 2014-07-13 DIAGNOSIS — D684 Acquired coagulation factor deficiency: Secondary | ICD-10-CM | POA: Diagnosis present

## 2014-07-13 DIAGNOSIS — T428X2A Poisoning by antiparkinsonism drugs and other central muscle-tone depressants, intentional self-harm, initial encounter: Secondary | ICD-10-CM | POA: Diagnosis present

## 2014-07-13 DIAGNOSIS — I10 Essential (primary) hypertension: Secondary | ICD-10-CM | POA: Diagnosis present

## 2014-07-13 DIAGNOSIS — T50901A Poisoning by unspecified drugs, medicaments and biological substances, accidental (unintentional), initial encounter: Secondary | ICD-10-CM | POA: Diagnosis present

## 2014-07-13 DIAGNOSIS — R945 Abnormal results of liver function studies: Secondary | ICD-10-CM

## 2014-07-13 DIAGNOSIS — Z90710 Acquired absence of both cervix and uterus: Secondary | ICD-10-CM

## 2014-07-13 DIAGNOSIS — T402X2A Poisoning by other opioids, intentional self-harm, initial encounter: Secondary | ICD-10-CM | POA: Diagnosis present

## 2014-07-13 DIAGNOSIS — Z882 Allergy status to sulfonamides status: Secondary | ICD-10-CM

## 2014-07-13 DIAGNOSIS — T391X2A Poisoning by 4-Aminophenol derivatives, intentional self-harm, initial encounter: Principal | ICD-10-CM | POA: Diagnosis present

## 2014-07-13 DIAGNOSIS — F419 Anxiety disorder, unspecified: Secondary | ICD-10-CM | POA: Diagnosis present

## 2014-07-13 DIAGNOSIS — E876 Hypokalemia: Secondary | ICD-10-CM | POA: Diagnosis present

## 2014-07-13 DIAGNOSIS — F1010 Alcohol abuse, uncomplicated: Secondary | ICD-10-CM | POA: Diagnosis present

## 2014-07-13 DIAGNOSIS — R7989 Other specified abnormal findings of blood chemistry: Secondary | ICD-10-CM

## 2014-07-13 DIAGNOSIS — Z881 Allergy status to other antibiotic agents status: Secondary | ICD-10-CM

## 2014-07-13 DIAGNOSIS — T424X2A Poisoning by benzodiazepines, intentional self-harm, initial encounter: Secondary | ICD-10-CM | POA: Diagnosis present

## 2014-07-13 DIAGNOSIS — K7290 Hepatic failure, unspecified without coma: Secondary | ICD-10-CM | POA: Diagnosis present

## 2014-07-13 DIAGNOSIS — F32A Depression, unspecified: Secondary | ICD-10-CM

## 2014-07-13 LAB — URINALYSIS, ROUTINE W REFLEX MICROSCOPIC
BILIRUBIN URINE: NEGATIVE
Glucose, UA: NEGATIVE mg/dL
Hgb urine dipstick: NEGATIVE
KETONES UR: NEGATIVE mg/dL
Leukocytes, UA: NEGATIVE
NITRITE: NEGATIVE
PROTEIN: NEGATIVE mg/dL
Urobilinogen, UA: 0.2 mg/dL (ref 0.0–1.0)
pH: 5 (ref 5.0–8.0)

## 2014-07-13 LAB — ACETAMINOPHEN LEVEL: ACETAMINOPHEN (TYLENOL), SERUM: 491.9 ug/mL — AB (ref 10–30)

## 2014-07-13 LAB — CBC WITH DIFFERENTIAL/PLATELET
BASOS PCT: 0 % (ref 0–1)
Basophils Absolute: 0 10*3/uL (ref 0.0–0.1)
Eosinophils Absolute: 0 10*3/uL (ref 0.0–0.7)
Eosinophils Relative: 0 % (ref 0–5)
HCT: 43.8 % (ref 36.0–46.0)
HEMOGLOBIN: 15.4 g/dL — AB (ref 12.0–15.0)
LYMPHS ABS: 1.6 10*3/uL (ref 0.7–4.0)
Lymphocytes Relative: 26 % (ref 12–46)
MCH: 31.8 pg (ref 26.0–34.0)
MCHC: 35.2 g/dL (ref 30.0–36.0)
MCV: 90.3 fL (ref 78.0–100.0)
MONOS PCT: 9 % (ref 3–12)
Monocytes Absolute: 0.5 10*3/uL (ref 0.1–1.0)
NEUTROS ABS: 3.8 10*3/uL (ref 1.7–7.7)
Neutrophils Relative %: 65 % (ref 43–77)
Platelets: 311 10*3/uL (ref 150–400)
RBC: 4.85 MIL/uL (ref 3.87–5.11)
RDW: 12.4 % (ref 11.5–15.5)
WBC: 6 10*3/uL (ref 4.0–10.5)

## 2014-07-13 LAB — COMPREHENSIVE METABOLIC PANEL
ALBUMIN: 4.6 g/dL (ref 3.5–5.2)
ALK PHOS: 80 U/L (ref 39–117)
ALT: 166 U/L — ABNORMAL HIGH (ref 0–35)
ANION GAP: 25 — AB (ref 5–15)
AST: 188 U/L — ABNORMAL HIGH (ref 0–37)
BILIRUBIN TOTAL: 0.4 mg/dL (ref 0.3–1.2)
BUN: 11 mg/dL (ref 6–23)
CHLORIDE: 100 meq/L (ref 96–112)
CO2: 15 mEq/L — ABNORMAL LOW (ref 19–32)
Calcium: 9.6 mg/dL (ref 8.4–10.5)
Creatinine, Ser: 0.26 mg/dL — ABNORMAL LOW (ref 0.50–1.10)
GFR calc Af Amer: >90 mL/min (ref >90)
GFR calc non Af Amer: >90 mL/min (ref >90)
GLUCOSE: 151 mg/dL — AB (ref 70–99)
POTASSIUM: 3.2 meq/L — AB (ref 3.7–5.3)
Sodium: 140 mEq/L (ref 137–147)
Total Protein: 7.6 g/dL (ref 6.0–8.3)

## 2014-07-13 LAB — BLOOD GAS, ARTERIAL
Acid-base deficit: 8.1 mmol/L — ABNORMAL HIGH (ref 0.0–2.0)
Bicarbonate: 15.1 mEq/L — ABNORMAL LOW (ref 20.0–24.0)
Drawn by: 331471
O2 CONTENT: 2 L/min
O2 Saturation: 98.1 %
PATIENT TEMPERATURE: 95.2
TCO2: 13.7 mmol/L (ref 0–100)
pCO2 arterial: 23.2 mmHg — ABNORMAL LOW (ref 35.0–45.0)
pH, Arterial: 7.419 (ref 7.350–7.450)
pO2, Arterial: 105 mmHg — ABNORMAL HIGH (ref 80.0–100.0)

## 2014-07-13 LAB — I-STAT CHEM 8, ED
BUN: 10 mg/dL (ref 6–23)
Calcium, Ion: 0.95 mmol/L — ABNORMAL LOW (ref 1.12–1.23)
Chloride: 107 mEq/L (ref 96–112)
Creatinine, Ser: 1.5 mg/dL — ABNORMAL HIGH (ref 0.50–1.10)
Glucose, Bld: 157 mg/dL — ABNORMAL HIGH (ref 70–99)
HCT: 47 % — ABNORMAL HIGH (ref 36.0–46.0)
HEMOGLOBIN: 16 g/dL — AB (ref 12.0–15.0)
Potassium: 2.9 mEq/L — CL (ref 3.7–5.3)
Sodium: 138 mEq/L (ref 137–147)
TCO2: 16 mmol/L (ref 0–100)

## 2014-07-13 LAB — PROTIME-INR
INR: 1.2 (ref 0.00–1.49)
Prothrombin Time: 15.3 seconds — ABNORMAL HIGH (ref 11.6–15.2)

## 2014-07-13 LAB — RAPID URINE DRUG SCREEN, HOSP PERFORMED
AMPHETAMINES: NOT DETECTED
Barbiturates: NOT DETECTED
Benzodiazepines: NOT DETECTED
Cocaine: NOT DETECTED
Opiates: NOT DETECTED
Tetrahydrocannabinol: NOT DETECTED

## 2014-07-13 LAB — ETHANOL: Alcohol, Ethyl (B): <11 mg/dL (ref 0–11)

## 2014-07-13 LAB — TSH: TSH: 0.306 u[IU]/mL — ABNORMAL LOW (ref 0.350–4.500)

## 2014-07-13 LAB — MAGNESIUM: Magnesium: 1.9 mg/dL (ref 1.5–2.5)

## 2014-07-13 LAB — MRSA PCR SCREENING: MRSA BY PCR: NEGATIVE

## 2014-07-13 LAB — POC URINE PREG, ED: PREG TEST UR: NEGATIVE

## 2014-07-13 LAB — PHOSPHORUS: Phosphorus: 2 mg/dL — ABNORMAL LOW (ref 2.3–4.6)

## 2014-07-13 LAB — SALICYLATE LEVEL: Salicylate Lvl: <2 mg/dL — ABNORMAL LOW (ref 2.8–20.0)

## 2014-07-13 LAB — APTT: APTT: 29 s (ref 24–37)

## 2014-07-13 MED ORDER — SODIUM CHLORIDE 0.9 % IV SOLN
INTRAVENOUS | Status: AC
  Administered 2014-07-13: 16:00:00 via INTRAVENOUS

## 2014-07-13 MED ORDER — SODIUM CHLORIDE 0.9 % IV SOLN
1000.0000 mL | Freq: Once | INTRAVENOUS | Status: AC
Start: 2014-07-13 — End: 2014-07-13
  Administered 2014-07-13: 1000 mL via INTRAVENOUS

## 2014-07-13 MED ORDER — ACETYLCYSTEINE LOAD VIA INFUSION
150.0000 mg/kg | Freq: Once | INTRAVENOUS | Status: AC
  Administered 2014-07-13: 9255 mg via INTRAVENOUS
  Filled 2014-07-13: qty 232

## 2014-07-13 MED ORDER — NALOXONE HCL 1 MG/ML IJ SOLN
INTRAMUSCULAR | Status: AC
  Administered 2014-07-13: 2 mg via INTRAVENOUS
  Filled 2014-07-13: qty 2

## 2014-07-13 MED ORDER — POTASSIUM CHLORIDE 10 MEQ/100ML IV SOLN
10.0000 meq | Freq: Once | INTRAVENOUS | Status: AC
  Administered 2014-07-13: 10 meq via INTRAVENOUS
  Filled 2014-07-13: qty 100

## 2014-07-13 MED ORDER — POTASSIUM CHLORIDE 10 MEQ/100ML IV SOLN
10.0000 meq | INTRAVENOUS | Status: AC
  Administered 2014-07-13 (×3): 10 meq via INTRAVENOUS
  Filled 2014-07-13: qty 100

## 2014-07-13 MED ORDER — SODIUM CHLORIDE 0.9 % IV SOLN
1000.0000 mL | Freq: Once | INTRAVENOUS | Status: AC
  Administered 2014-07-13: 1000 mL via INTRAVENOUS

## 2014-07-13 MED ORDER — DEXTROSE 5 % IV SOLN
15.0000 mg/kg/h | INTRAVENOUS | Status: DC
  Administered 2014-07-13: 15 mg/kg/h via INTRAVENOUS
  Filled 2014-07-13 (×3): qty 200

## 2014-07-13 MED ORDER — ONDANSETRON HCL 4 MG/2ML IJ SOLN
INTRAMUSCULAR | Status: AC
  Filled 2014-07-13: qty 2

## 2014-07-13 MED ORDER — ONDANSETRON HCL 4 MG/2ML IJ SOLN: 4.0000 mg | Freq: Three times a day (TID) | INTRAMUSCULAR | Status: DC | PRN

## 2014-07-13 MED ORDER — ENOXAPARIN SODIUM 40 MG/0.4ML ~~LOC~~ SOLN
40.0000 mg | SUBCUTANEOUS | Status: DC
  Administered 2014-07-13 – 2014-07-14 (×2): 40 mg via SUBCUTANEOUS
  Filled 2014-07-13 (×2): qty 0.4

## 2014-07-13 MED ORDER — NALOXONE HCL 1 MG/ML IJ SOLN
2.0000 mg | Freq: Once | INTRAMUSCULAR | Status: AC
  Administered 2014-07-13: 2 mg via INTRAVENOUS

## 2014-07-13 MED ORDER — SODIUM CHLORIDE 0.9 % IV SOLN
1000.0000 mL | INTRAVENOUS | Status: DC
  Administered 2014-07-13 – 2014-07-15 (×4): 1000 mL via INTRAVENOUS

## 2014-07-13 MED ORDER — ONDANSETRON HCL 4 MG PO TABS
4.0000 mg | ORAL_TABLET | Freq: Four times a day (QID) | ORAL | Status: DC | PRN
  Administered 2014-07-13 – 2014-07-15 (×3): 4 mg via ORAL
  Filled 2014-07-13 (×4): qty 1

## 2014-07-13 MED ORDER — NALOXONE HCL 0.4 MG/ML IJ SOLN: 0.4000 mg | Freq: Once | INTRAMUSCULAR | Status: DC

## 2014-07-13 MED ORDER — FLUMAZENIL 0.5 MG/5ML IV SOLN
0.2000 mg | Freq: Once | INTRAVENOUS | Status: AC
  Administered 2014-07-13: 0.2 mg via INTRAVENOUS

## 2014-07-13 MED ORDER — FLUMAZENIL 0.5 MG/5ML IV SOLN
INTRAVENOUS | Status: AC
  Filled 2014-07-13: qty 5

## 2014-07-13 MED ORDER — HYDRALAZINE HCL 20 MG/ML IJ SOLN
5.0000 mg | Freq: Four times a day (QID) | INTRAMUSCULAR | Status: DC | PRN
  Administered 2014-07-13 – 2014-07-14 (×2): 5 mg via INTRAVENOUS
  Administered 2014-07-14: 10 mg via INTRAVENOUS
  Filled 2014-07-13 (×3): qty 1

## 2014-07-13 MED ORDER — ONDANSETRON HCL 4 MG/2ML IJ SOLN
4.0000 mg | Freq: Four times a day (QID) | INTRAMUSCULAR | Status: DC | PRN
  Administered 2014-07-13 – 2014-07-15 (×7): 4 mg via INTRAVENOUS
  Filled 2014-07-13 (×6): qty 2
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
## 2014-07-13 NOTE — ED Notes (Signed)
resp called.

## 2014-07-13 NOTE — ED Provider Notes (Signed)
CSN: [id number]     Arrival date & time 07/13/14  1139 History   First MD Initiated Contact with Patient 07/13/14 1146     Chief Complaint  Patient presents with  . Drug Overdose      HPI PER EMS- Pt picked up from home c/o overdose. Reported that pt took 55- 500mg  of tylenol, 40-350mg , carisoprodol, 15- 1mg  clonazepam, 40 5mg  oxycodone. Actual time and amount unknown. Pt reported she took medications last night. Pt arrived to ED responsive to painfull stimuli.  Gag intact.  Past Medical History  Diagnosis Date  . Migraines   . Endometriosis   . ETOH abuse     sober 3 1/2 years  . Anorexia   . Depression   . Anxiety    Past Surgical History  Procedure Laterality Date  . Knee surgery    . Abdominal surgery    . Cholecystectomy    . Appendectomy    . Abdominal hysterectomy    . Nasal sinus surgery    . Laproscopy    . Carpal tunnel release      2010   Family History  Problem Relation Age of Onset  . Hypertension Mother   . Hyperlipidemia Mother   . Heart failure Father   . Cancer Other    History  Substance Use Topics  . Smoking status: Current Every Day Smoker -- 0.25 packs/day    Types: Cigarettes  . Smokeless tobacco: Never Used  . Alcohol Use: No   OB History   Grav Para Term Preterm Abortions TAB SAB Ect Mult Living                 Review of Systems  Unable to perform ROS: Acuity of condition      Allergies  Aspirin; Doxycycline; Sulfa antibiotics; Imitrex; Iodinated diagnostic agents; Levofloxacin; Prednisone; Tramadol; Latex; and Nsaids  Home Medications   Prior to Admission medications   Medication Sig Start Date End Date Taking? Authorizing Provider  acetaminophen (TYLENOL) 325 MG tablet Take 2 tablets (650 mg total) by mouth every 6 (six) hours as needed for moderate pain or headache. 03/20/14  Yes Domenic Polite, MD  acetaminophen (TYLENOL) 500 MG tablet Take 500 mg by mouth every 6 (six) hours as needed for moderate pain or headache.   Yes  Historical Provider, MD  calcium-vitamin D (OSCAL WITH D) 500-200 MG-UNIT per tablet Take 1 tablet by mouth daily with breakfast.   Yes Historical Provider, MD  carisoprodol (SOMA) 350 MG tablet Take 350 mg by mouth 4 (four) times daily.    Yes Historical Provider, MD  clonazePAM (KLONOPIN) 1 MG tablet Take 1 mg by mouth 3 (three) times daily as needed for anxiety (sleep).    Yes Historical Provider, MD  levETIRAcetam (KEPPRA) 500 MG tablet Take 500 mg by mouth 2 (two) times daily.   Yes Historical Provider, MD  multivitamin [facility]) per tablet Take 1 tablet by mouth daily.     Yes Historical Provider, MD  oxyCODONE-acetaminophen (PERCOCET/ROXICET) 5-325 MG per tablet Take 1-2 tablets by mouth 4 (four) times daily.    Yes Historical Provider, MD  acetylcysteine 40,000 mg in dextrose 5 % 800 mL Inject 925.5 mg/hr into the vein continuous. 07/14/14   Robbie Lis, MD  enoxaparin (LOVENOX) 40 MG/0.4ML injection Inject 0.4 mLs (40 mg total) into the skin daily. 07/14/14   Robbie Lis, MD  ondansetron (ZOFRAN) 4 MG tablet Take 1 tablet (4 mg total) by mouth every 6 (six)  hours as needed for nausea. 07/14/14   Robbie Lis, MD   BP 174/87  Pulse 100  Temp(Src) 100.8 F (38.2 C) (Core (Comment))  Resp 17  Ht 5\' 1"  (1.549 m)  Wt 139 lb 11.2 oz (63.368 kg)  BMI 26.41 kg/m2  SpO2 98% Physical Exam  Nursing note and vitals reviewed. Constitutional: She is oriented to person, place, and time. She appears well-developed and well-nourished. She appears lethargic. She appears distressed.  HENT:  Head: Normocephalic and atraumatic.  Eyes: Pupils are equal, round, and reactive to light.  Neck: Normal range of motion.  Cardiovascular: Normal rate and intact distal pulses.   Pulmonary/Chest: No respiratory distress.  Abdominal: Normal appearance. She exhibits no distension.  Musculoskeletal: Normal range of motion.  Neurological: She is oriented to person, place, and time. She appears lethargic.  No cranial nerve deficit.  Skin: Skin is warm and dry. No rash noted.  Psychiatric: She expresses homicidal ideation. She is noncommunicative.    ED Course  Procedures (including critical care time) Medications  0.9 %  sodium chloride infusion (0 mLs Intravenous Stopped 07/13/14 1330)    Followed by  0.9 %  sodium chloride infusion (0 mLs Intravenous Stopped 07/13/14 1225)    Followed by  0.9 %  sodium chloride infusion (1,000 mLs Intravenous New Bag/Given 07/15/14 1931)  acetylcysteine (ACETADOTE) 40 mg/mL load via infusion 9,255 mg (0 mg/kg  61.7 kg Intravenous Stopped 07/13/14 1420)    Followed by  acetylcysteine (ACETADOTE) 40,000 mg in dextrose 5 % 1,000 mL (40 mg/mL) infusion (15 mg/kg/hr  61.7 kg Intravenous New Bag/Given 07/13/14 1350)  ondansetron (ZOFRAN) tablet 4 mg (4 mg Oral Given 07/15/14 1846)    Or  ondansetron (ZOFRAN) injection 4 mg ( Intravenous See Alternative 07/15/14 1846)  ondansetron (ZOFRAN) 4 MG/2ML injection (  Not Given 07/13/14 1600)  labetalol (NORMODYNE,TRANDATE) injection 10 mg (10 mg Intravenous Given 07/15/14 1510)  pantoprazole (PROTONIX) injection 40 mg (40 mg Intravenous Given 07/15/14 0909)  oxyCODONE (Oxy IR/ROXICODONE) immediate release tablet 5 mg (5 mg Oral Given 07/15/14 1705)  hydrALAZINE (APRESOLINE) injection 10 mg (10 mg Intravenous Given 07/15/14 1706)  potassium chloride 10 mEq in 100 mL IVPB (0 mEq Intravenous Stopped 07/13/14 1320)  flumazenil (ROMAZICON) injection 0.2 mg (0.2 mg Intravenous Given 07/13/14 1219)  naloxone (NARCAN) injection 2 mg (2 mg Intravenous Given 07/13/14 1219)  0.9 %  sodium chloride infusion ( Intravenous New Bag/Given 07/13/14 1556)  potassium chloride 10 mEq in 100 mL IVPB (10 mEq Intravenous Given 07/13/14 1829)  diphenhydrAMINE (BENADRYL) injection 12.5 mg (12.5 mg Intravenous Given 07/14/14 1038)  metoCLOPramide (REGLAN) injection 5 mg (5 mg Intravenous Given 07/15/14 0906)  potassium chloride SA  (K-DUR,KLOR-CON) CR tablet 40 mEq (0 mEq Oral Duplicate 26/37/85 8850)  phytonadione (VITAMIN K) 10 mg in dextrose 5 % 50 mL IVPB (10 mg Intravenous Given 07/15/14 0911)    CRITICAL CARE Performed by: Leonard Schwartz L Total critical care time: 45 min Critical care time was exclusive of separately billable procedures and treating other patients. Critical care was necessary to treat or prevent imminent or life-threatening deterioration. Critical care was time spent personally by me on the following activities: development of treatment plan with patient and/or surrogate as well as nursing, discussions with consultants, evaluation of patient's response to treatment, examination of patient, obtaining history from patient or surrogate, ordering and performing treatments and interventions, ordering and review of laboratory studies, ordering and review of radiographic studies, pulse oximetry and re-evaluation of patient's  condition.  Labs Review Labs Reviewed  ACETAMINOPHEN LEVEL - Abnormal; Notable for the following:    Acetaminophen (Tylenol), Serum 491.9 (*)    All other components within normal limits  CBC WITH DIFFERENTIAL - Abnormal; Notable for the following:    Hemoglobin 15.4 (*)    All other components within normal limits  COMPREHENSIVE METABOLIC PANEL - Abnormal; Notable for the following:    Potassium 3.2 (*)    CO2 15 (*)    Glucose, Bld 151 (*)    Creatinine, Ser 0.26 (*)    AST 188 (*)    ALT 166 (*)    Anion gap 25 (*)    All other components within normal limits  SALICYLATE LEVEL - Abnormal; Notable for the following:    Salicylate Lvl <1.6 (*)    All other components within normal limits  URINALYSIS, ROUTINE W REFLEX MICROSCOPIC - Abnormal; Notable for the following:    Specific Gravity, Urine >1.046 (*)    All other components within normal limits  BLOOD GAS, ARTERIAL - Abnormal; Notable for the following:    pCO2 arterial 23.2 (*)    pO2, Arterial 105.0 (*)     Bicarbonate 15.1 (*)    Acid-base deficit 8.1 (*)    All other components within normal limits  PROTIME-INR - Abnormal; Notable for the following:    Prothrombin Time 15.3 (*)    All other components within normal limits  PHOSPHORUS - Abnormal; Notable for the following:    Phosphorus 2.0 (*)    All other components within normal limits  TSH - Abnormal; Notable for the following:    TSH 0.306 (*)    All other components within normal limits  CBC - Abnormal; Notable for the following:    WBC 20.8 (*)    Platelets 116 (*)    All other components within normal limits  COMPREHENSIVE METABOLIC PANEL - Abnormal; Notable for the following:    CO2 14 (*)    Glucose, Bld 219 (*)    Creatinine, Ser 0.45 (*)    Calcium 8.3 (*)    Albumin 3.4 (*)    AST 6844 (*)    ALT 4861 (*)    Total Bilirubin 1.5 (*)    Anion gap 19 (*)    All other components within normal limits  PROTIME-INR - Abnormal; Notable for the following:    Prothrombin Time 32.5 (*)    INR 3.14 (*)    All other components within normal limits  LACTIC ACID, PLASMA - Abnormal; Notable for the following:    Lactic Acid, Venous 3.0 (*)    All other components within normal limits  BLOOD GAS, ARTERIAL - Abnormal; Notable for the following:    pCO2 arterial 22.3 (*)    pO2, Arterial 102.0 (*)    Bicarbonate 15.1 (*)    Acid-base deficit 6.7 (*)    All other components within normal limits  COMPREHENSIVE METABOLIC PANEL - Abnormal; Notable for the following:    Potassium 3.2 (*)    Glucose, Bld 169 (*)    Creatinine, Ser 0.46 (*)    AST 6448 (*)    ALT 6779 (*)    Total Bilirubin 1.9 (*)    Anion gap 17 (*)    All other components within normal limits  BLOOD GAS, ARTERIAL - Abnormal; Notable for the following:    pH, Arterial 7.452 (*)    pCO2 arterial 27.7 (*)    pO2, Arterial 54.1 (*)  Bicarbonate 19.1 (*)    Acid-base deficit 3.1 (*)    All other components within normal limits  CBC WITH DIFFERENTIAL -  Abnormal; Notable for the following:    WBC 15.6 (*)    Neutrophils Relative % 89 (*)    Neutro Abs 13.8 (*)    Lymphocytes Relative 8 (*)    All other components within normal limits  LACTIC ACID, PLASMA - Abnormal; Notable for the following:    Lactic Acid, Venous 2.6 (*)    All other components within normal limits  PROTIME-INR - Abnormal; Notable for the following:    Prothrombin Time 39.4 (*)    INR 4.02 (*)    All other components within normal limits  HEPATIC FUNCTION PANEL - Abnormal; Notable for the following:    Albumin 3.4 (*)    AST 8434 (*)    ALT 9894 (*)    Total Bilirubin 1.7 (*)    Bilirubin, Direct 0.9 (*)    All other components within normal limits  BASIC METABOLIC PANEL - Abnormal; Notable for the following:    Potassium 2.8 (*)    Glucose, Bld 148 (*)    Anion gap 16 (*)    All other components within normal limits  PHOSPHORUS - Abnormal; Notable for the following:    Phosphorus 1.8 (*)    All other components within normal limits  GLUCOSE, CAPILLARY - Abnormal; Notable for the following:    Glucose-Capillary 128 (*)    All other components within normal limits  I-STAT CHEM 8, ED - Abnormal; Notable for the following:    Potassium 2.9 (*)    Creatinine, Ser 1.50 (*)    Glucose, Bld 157 (*)    Calcium, Ion 0.95 (*)    Hemoglobin 16.0 (*)    HCT 47.0 (*)    All other components within normal limits  URINE CULTURE  MRSA PCR SCREENING  CULTURE, BLOOD (ROUTINE X 2)  URINE RAPID DRUG SCREEN (HOSP PERFORMED)  ETHANOL  MAGNESIUM  APTT  APTT  ACETAMINOPHEN LEVEL  ACETAMINOPHEN LEVEL  MAGNESIUM  ACETAMINOPHEN LEVEL  CBC WITH DIFFERENTIAL  CBC  COMPREHENSIVE METABOLIC PANEL  PROTIME-INR  ACETAMINOPHEN LEVEL  POC URINE PREG, ED  TYPE AND SCREEN  ABO/RH    Imaging Review No results found.   EKG Interpretation   Date/Time:  Saturday July 13 2014 12:05:51 EDT Ventricular Rate:  74 PR Interval:  156 QRS Duration: 99 QT Interval:   540 QTC Calculation: 599 R Axis:   82 Text Interpretation:  Sinus rhythm Prolonged QT interval Confirmed by  Audie Pinto  MD, Kanishk Stroebel ([id number]) on 07/13/2014 12:41:57 PM     I discussed the case with the critical care medicine doctors who saw and evaluated the patient and  recommended admission to the hospitalist service with critical care consultation. MDM   Final diagnoses:  Tylenol overdose, intentional self-harm, initial encounter  Overdose        Dot Lanes, MD 07/15/14 2055

## 2014-07-13 NOTE — ED Notes (Signed)
Pt belongings in locker 28

## 2014-07-13 NOTE — ED Notes (Addendum)
PER EMS- Pt picked up from home c/o overdose.  Reported that pt took 40- 500mg  of tylenol, 40-350mg , carisoprodol, 15- 1mg  clonazepam,  40 5mg  oxycodone. Actual time and amount unknown.  Pt reported she took medications last night.  Pt arrived to ED unresponsive.  BP 81/50.  Dr Stevie Kern ordered 2mg  of romazicon and 2mg  of narcan. Both given.

## 2014-07-13 NOTE — ED Notes (Signed)
Bed: NK53 Expected date: 07/13/14 Expected time: 11:31 AM Means of arrival: Ambulance Comments: OD last night

## 2014-07-13 NOTE — ED Notes (Signed)
Called poison control. Recommend Mucomyst IV 150mg /kg for 1 hour, then 15mg /kg for 23 hours. Recommend ALT/AST/INR/acetomenophen levels. If abnormal continue Mucomyst for 24 more hours.

## 2014-07-13 NOTE — H&P (Signed)
[facility] History and Physical  [name] GBT:[id number] DOB: 03-08-1966 DOA: 07/13/2014  Referring physician: ER physician PCP: Imelda Pillow, NP   Chief Complaint: tylenol overdose, altered mental status   HPI:  48 year old female with past medical history of depression, seizure disorder who was brought to [facility] ED 07/13/2014 by EMS after overdose on tylenol, carisoprodol, clonazepam and oxycodone. Patient is not a good historian at this time due to lethargy. No respiratory distress. No fevers. No vomiting. Per EMS report, patient took 75 of 500 mg tylenol, 40 of 350 mg carisoprodol, 15 of 1 mg clonazepam and 40 of 5 mg oxycodone.  On arrival to ED, patient was unresponsive. BP was 96/49, HR 72, RR 15-27, T 95.8 F and she required bear hugger to improve the temperature which then normalized to 98.4 F. Tylenol level was 492, UDS negative, alcohol level normal. She received narcan and romazicon. Poison control was contacted. She was subsequently started on mucomyst.   Assessment & Plan    Active Problems: Suicidal attempt by drug overdose / abnormal LFT's  Overdose on 75 of 500 mg tylenol, 40 of 350 mg carisoprodol, 15 of 1 mg clonazepam and 40 of 5 mg oxycodone.   Poison control center called and recommended Mucomyst IV 150mg /kg for 1 hour, then 15mg /kg for 23 hours. Recommend checking  ALT/AST/INR/acetomenophen levels and if abnormal continue Mucomyst for 24 more hours.   PCCM consulted and appreciate their recommendations.   Will need psych eval for suicidal ideations   DVT prophylaxis:   Lovenox subQ ordered   Radiological Exams on Admission: Dg Chest Portable 1 View 07/13/2014  No acute abnormalities.   Electronically Signed   By: Lavonia Dana M.D.   On: 07/13/2014 14:43    EKG: sinus rhythm  Code Status: Full Family Communication: family not at the bedside  Disposition Plan: Admit for further evaluation to stepdown unit.   Leisa Lenz, MD  [facility] Pager [phone]  Review of Systems:  Unable to obtain due to lethargy.  Past Medical History  Diagnosis Date  . Migraines   . Endometriosis   . ETOH abuse     sober 3 1/2 years  . Anorexia   . Depression   . Anxiety    Past Surgical History  Procedure Laterality Date  . Knee surgery    . Abdominal surgery    . Cholecystectomy    . Appendectomy    . Abdominal hysterectomy    . Nasal sinus surgery    . Laproscopy    . Carpal tunnel release      2010   Social History:  reports that she has been smoking Cigarettes.  She has been smoking about 0.25 packs per day. She has never used smokeless tobacco. She reports that she does not drink alcohol or use illicit drugs.  Allergies  Allergen Reactions  . Aspirin Anaphylaxis  . Doxycycline Anaphylaxis  . Sulfa Antibiotics Anaphylaxis  . Imitrex [Sumatriptan Base] Other (See Comments)    Blood Pressure goes high  . Iodinated Diagnostic Agents   . Levofloxacin Other (See Comments)    Joints hurt  . Prednisone Other (See Comments)    She goes crazy  . Tramadol Other (See Comments)    seizures  . Latex Rash  . Nsaids Rash    Family History:  Family History  Problem Relation Age of Onset  . Hypertension Mother   . Hyperlipidemia Mother   . Heart failure Father   .  Cancer Other      Prior to Admission medications   Medication Sig Start Date End Date Taking? Authorizing Provider  acetaminophen (TYLENOL) 325 MG tablet Take 2 tablets (650 mg total) by mouth every 6 (six) hours as needed for moderate pain or headache. 03/20/14  Yes Domenic Polite, MD  acetaminophen (TYLENOL) 500 MG tablet Take 500 mg by mouth every 6 (six) hours as needed for moderate pain or headache.   Yes Historical Provider, MD  calcium-vitamin D (OSCAL WITH D) 500-200 MG-UNIT per tablet Take 1 tablet by mouth daily with breakfast.   Yes Historical Provider, MD  carisoprodol (SOMA) 350 MG tablet Take 350 mg by mouth 4 (four) times daily.    Yes  Historical Provider, MD  clonazePAM (KLONOPIN) 1 MG tablet Take 1 mg by mouth 3 (three) times daily as needed for anxiety (sleep).    Yes Historical Provider, MD  multivitamin [facility]) per tablet Take 1 tablet by mouth daily.     Yes Historical Provider, MD  oxyCODONE-acetaminophen (PERCOCET/ROXICET) 5-325 MG per tablet Take 1-2 tablets by mouth 4 (four) times daily.    Yes Historical Provider, MD   Physical Exam: Filed Vitals:   07/13/14 1430 07/13/14 1445 07/13/14 1453 07/13/14 1500  BP: 117/59 116/58  129/67  Pulse: 86 88  91  Temp:   96.8 F (36 C)   TempSrc:   Core (Comment)   Resp: 23 21  21   Height:      Weight:      SpO2: 96% 94%  97%    Physical Exam  Constitutional: Appears lethargic, no distress HENT: Normocephalic. No tonsillar erythema or exudates Eyes: Conjunctivae and EOM are normal. PERRLA, no scleral icterus.  Neck: Normal ROM. Neck supple. No JVD. No tracheal deviation. No thyromegaly.  CVS: RRR, S1/S2 +, no murmurs, no gallops, no carotid bruit.  Pulmonary: Effort and breath sounds normal, no stridor, rhonchi, wheezes, rales.  Abdominal: Soft. BS +,  no distension, tenderness, rebound or guarding.  Musculoskeletal: No edema and no tenderness.  Lymphadenopathy: No lymphadenopathy noted, cervical, inguinal. Neuro: no focal neurologic deficits, normal reflexes Skin: Skin is warm and dry. No rash noted.  Psychiatric: Lethargic, unable to assess  Labs on Admission:  Basic Metabolic Panel:  Recent Labs Lab 07/13/14 1151 07/13/14 1158  NA 140 138  K 3.2* 2.9*  CL 100 107  CO2 15*  --   GLUCOSE 151* 157*  BUN 11 10  CREATININE 0.26* 1.50*  CALCIUM 9.6  --    Liver Function Tests:  Recent Labs Lab 07/13/14 1151  AST 188*  ALT 166*  ALKPHOS 80  BILITOT 0.4  PROT 7.6  ALBUMIN 4.6   No results found for this basename: LIPASE, AMYLASE,  in the last 168 hours No results found for this basename: AMMONIA,  in the last 168 hours CBC:  Recent  Labs Lab 07/13/14 1151 07/13/14 1158  WBC 6.0  --   NEUTROABS 3.8  --   HGB 15.4* 16.0*  HCT 43.8 47.0*  MCV 90.3  --   PLT 311  --    Cardiac Enzymes: No results found for this basename: CKTOTAL, CKMB, CKMBINDEX, TROPONINI,  in the last 168 hours BNP: No components found with this basename: POCBNP,  CBG: No results found for this basename: GLUCAP,  in the last 168 hours  If 7PM-7AM, please contact night-coverage [email or web address] Password [email or web address] 07/13/2014, 3:05 PM

## 2014-07-14 LAB — COMPREHENSIVE METABOLIC PANEL
ALT: 4861 U/L — AB (ref 0–35)
ALT: 6779 U/L — ABNORMAL HIGH (ref 0–35)
ANION GAP: 17 — AB (ref 5–15)
AST: 6844 U/L — AB (ref 0–37)
AST: 6448 U/L — ABNORMAL HIGH (ref 0–37)
Albumin: 3.4 g/dL — ABNORMAL LOW (ref 3.5–5.2)
Albumin: 3.7 g/dL (ref 3.5–5.2)
Alkaline Phosphatase: 77 U/L (ref 39–117)
Alkaline Phosphatase: 94 U/L (ref 39–117)
Anion gap: 19 — ABNORMAL HIGH (ref 5–15)
BUN: 10 mg/dL (ref 6–23)
BUN: 7 mg/dL (ref 6–23)
CHLORIDE: 110 meq/L (ref 96–112)
CO2: 14 mEq/L — ABNORMAL LOW (ref 19–32)
CO2: 19 mEq/L (ref 19–32)
Calcium: 8.3 mg/dL — ABNORMAL LOW (ref 8.4–10.5)
Calcium: 8.7 mg/dL (ref 8.4–10.5)
Chloride: 110 mEq/L (ref 96–112)
Creatinine, Ser: 0.45 mg/dL — ABNORMAL LOW (ref 0.50–1.10)
Creatinine, Ser: 0.46 mg/dL — ABNORMAL LOW (ref 0.50–1.10)
GFR calc Af Amer: >90 mL/min (ref >90)
GFR calc non Af Amer: >90 mL/min (ref >90)
GFR calc non Af Amer: >90 mL/min (ref >90)
GLUCOSE: 169 mg/dL — AB (ref 70–99)
Glucose, Bld: 219 mg/dL — ABNORMAL HIGH (ref 70–99)
POTASSIUM: 3.2 meq/L — AB (ref 3.7–5.3)
Potassium: 4.1 mEq/L (ref 3.7–5.3)
SODIUM: 143 meq/L (ref 137–147)
Sodium: 146 mEq/L (ref 137–147)
TOTAL PROTEIN: 6.1 g/dL (ref 6.0–8.3)
TOTAL PROTEIN: 6.4 g/dL (ref 6.0–8.3)
Total Bilirubin: 1.5 mg/dL — ABNORMAL HIGH (ref 0.3–1.2)
Total Bilirubin: 1.9 mg/dL — ABNORMAL HIGH (ref 0.3–1.2)

## 2014-07-14 LAB — CBC
HCT: 40.2 % (ref 36.0–46.0)
Hemoglobin: 14 g/dL (ref 12.0–15.0)
MCH: 31.4 pg (ref 26.0–34.0)
MCHC: 34.8 g/dL (ref 30.0–36.0)
MCV: 90.1 fL (ref 78.0–100.0)
Platelets: 116 10*3/uL — ABNORMAL LOW (ref 150–400)
RBC: 4.46 MIL/uL (ref 3.87–5.11)
RDW: 12.8 % (ref 11.5–15.5)
WBC: 20.8 10*3/uL — ABNORMAL HIGH (ref 4.0–10.5)

## 2014-07-14 LAB — ACETAMINOPHEN LEVEL
Acetaminophen (Tylenol), Serum: 25.3 ug/mL (ref 10–30)
Acetaminophen (Tylenol), Serum: 22.4 ug/mL (ref 10–30)

## 2014-07-14 LAB — BLOOD GAS, ARTERIAL
ACID-BASE DEFICIT: 6.7 mmol/L — AB (ref 0.0–2.0)
Acid-base deficit: 3.1 mmol/L — ABNORMAL HIGH (ref 0.0–2.0)
Bicarbonate: 15.1 mEq/L — ABNORMAL LOW (ref 20.0–24.0)
Bicarbonate: 19.1 mEq/L — ABNORMAL LOW (ref 20.0–24.0)
FIO2: 0.21 %
FIO2: 0.21 %
O2 Saturation: 97.5 %
O2 Saturation: 88 %
PATIENT TEMPERATURE: 98.6
PH ART: 7.452 — AB (ref 7.350–7.450)
Patient temperature: 98.6
TCO2: 13 mmol/L (ref 0–100)
TCO2: 16.5 mmol/L (ref 0–100)
pCO2 arterial: 22.3 mmHg — ABNORMAL LOW (ref 35.0–45.0)
pCO2 arterial: 27.7 mmHg — ABNORMAL LOW (ref 35.0–45.0)
pH, Arterial: 7.444 (ref 7.350–7.450)
pO2, Arterial: 102 mmHg — ABNORMAL HIGH (ref 80.0–100.0)
pO2, Arterial: 54.1 mmHg — ABNORMAL LOW (ref 80.0–100.0)

## 2014-07-14 LAB — PROTIME-INR
INR: 3.14 — AB (ref 0.00–1.49)
Prothrombin Time: 32.5 seconds — ABNORMAL HIGH (ref 11.6–15.2)

## 2014-07-14 LAB — URINE CULTURE
Colony Count: NO GROWTH
Culture: NO GROWTH

## 2014-07-14 LAB — TYPE AND SCREEN
ABO/RH(D): O POS
Antibody Screen: NEGATIVE

## 2014-07-14 LAB — LACTIC ACID, PLASMA: LACTIC ACID, VENOUS: 3 mmol/L — AB (ref 0.5–2.2)

## 2014-07-14 LAB — APTT: aPTT: 35 seconds (ref 24–37)

## 2014-07-14 LAB — ABO/RH: ABO/RH(D): O POS

## 2014-07-14 MED ORDER — ONDANSETRON HCL 4 MG PO TABS: 4.0000 mg | ORAL_TABLET | Freq: Four times a day (QID) | ORAL | Status: DC | PRN

## 2014-07-14 MED ORDER — HYDRALAZINE HCL 20 MG/ML IJ SOLN
10.0000 mg | Freq: Four times a day (QID) | INTRAMUSCULAR | Status: DC | PRN
  Filled 2014-07-14: qty 1

## 2014-07-14 MED ORDER — DEXTROSE 5 % IV SOLN: 15.0000 mg/kg/h | INTRAVENOUS | Status: DC

## 2014-07-14 MED ORDER — DIPHENHYDRAMINE HCL 50 MG/ML IJ SOLN
12.5000 mg | Freq: Once | INTRAMUSCULAR | Status: AC
  Administered 2014-07-14: 12.5 mg via INTRAVENOUS
  Filled 2014-07-14: qty 1

## 2014-07-14 MED ORDER — LABETALOL HCL 5 MG/ML IV SOLN
10.0000 mg | INTRAVENOUS | Status: DC | PRN
  Administered 2014-07-14 – 2014-07-15 (×4): 10 mg via INTRAVENOUS
  Filled 2014-07-14 (×4): qty 4

## 2014-07-14 MED ORDER — ENOXAPARIN SODIUM 40 MG/0.4ML ~~LOC~~ SOLN: 40.0000 mg | SUBCUTANEOUS | Status: DC

## 2014-07-14 NOTE — Progress Notes (Signed)
Spoke with Laneta Simmers at [facility]. He advised patient in their ED is needing the extra bed at time and doubts will be able to transfer patient to their unit this evening. He continued to state once a bed becomes available, someone from transfer center will be intouch with [place].

## 2014-07-14 NOTE — Consult Note (Signed)
   Name: [name] MRN: [id number] DOB: [birth year]    ADMISSION DATE:  07/13/2014 CONSULTATION DATE:  07/14/2014  REFERRING MD :  Audie Pinto  CHIEF COMPLAINT:  Overdose  HISTORY OF PRESENT ILLNESS:  48 year old with seizure disorder and depression presents with intentional overdose.  Significant tylenol OD (37 grams)  SUBJECTIVE: nervous " i cant even die successfully"  VITAL SIGNS: Temp:  [95.8 F (35.4 C)-99.7 F (37.6 C)] 99.5 F (37.5 C) (10/25 0800) Pulse Rate:  [62-103] 100 (10/25 0800) Resp:  [15-30] 18 (10/25 0800) BP: (96-189)/(31-94) 161/77 mmHg (10/25 0800) SpO2:  [94 %-100 %] 96 % (10/25 0800) Weight:  [61.689 kg (136 lb)-65.1 kg (143 lb 8.3 oz)] 64 kg (141 lb 1.5 oz) (10/25 0341)  PHYSICAL EXAMINATION: Gen. Pleasant, well-nourished, in no distress, anxious ENT - no lesions, no jvd Neck: No JVD, no thyromegaly, no carotid bruits Lungs: no use of accessory muscles, CTA Cardiovascular: Rhythm regular, heart sounds  normal, no murmurs, no peripheral edema, mild int tachy Abdomen: soft and non-tender, mild upper quad tenderness Musculoskeletal: No deformities, no cyanosis or clubbing Neuro:  alert, non focal Skin:  Warm, no lesions/ rash    Recent Labs Lab 07/13/14 1151 07/13/14 1158 07/14/14 0400  NA 140 138 143  K 3.2* 2.9* 4.1  CL 100 107 110  CO2 15*  --  14*  BUN $Re'11 10 10  'GKv$ CREATININE 0.26* 1.50* 0.45*  GLUCOSE 151* 157* 219*    Recent Labs Lab 07/13/14 1151 07/13/14 1158 07/14/14 0400  HGB 15.4* 16.0* 14.0  HCT 43.8 47.0* 40.2  WBC 6.0  --  20.8*  PLT 311  --  116*   Dg Chest Portable 1 View  07/13/2014   CLINICAL DATA:  Multi drug overdose, hypertension, nausea, history smoking  EXAM: PORTABLE CHEST - 1 VIEW  COMPARISON:  Portable exam 1413 hr compared to 10/10/2010  FINDINGS: Normal heart size, mediastinal contours, and pulmonary vascularity.  Lungs clear.  No pleural effusion or pneumothorax.  Bones unremarkable.  IMPRESSION: No acute  abnormalities.   Electronically Signed   By: Lavonia Dana M.D.   On: 07/13/2014 14:43   EKG - normal sinus rhythm, QTC 599  ASSESSMENT / PLAN:  Suicidal overdose of benzos/narcotics/acetaminophen Severe tylenol OD Liver failure from tylenol OD (high risk fulminant) Mild elevated LFTs Anion gap acidosis - from tylenol OD Thrombocytopenia from liver failure  Recommend - Transfer to transplant ability hospital , [facility] NOW The total taken tylenal (with limited thrown up tabs), met acidosis, LF trend all concerning for high risk death / transplant needs Do not stop aceteylsteine Level difficult to utilize as not at 4 hours post ingestion Obtain LFt in afternoon Obtain, lactic, abg, coags now Type and cross If remains at [facility], change to pccm service NPO maintain Updated pt of severity of illness NO BENZO  D/w Dr Charlies Silvers, she will initiate transfer  Lavon Paganini. Titus Mould, MD, [place] Pgr: [facility]

## 2014-07-14 NOTE — Progress Notes (Signed)
Poison control called and advised to get acetaminophen level. If Acetaminophen is >300 or patient is having worsening acidosis, they are strongly urging/recommending hemodialysis begin before 10 pm if patient does not transfer to [place] by then. Will make charge RN aware.   Who I spoke with:  Pidgeon from [place] control  [phone]

## 2014-07-14 NOTE — Progress Notes (Signed)
Repeat acetaminophen level is 22.4 ug/ml.  Sarah from [place] control stated that based on this level, we can hold off on hemodialysis.  She recommended continuing mucomyst gtt at 15 mg/kg/hr and also giving patient 10 mg of vitamin K.  MD notified.

## 2014-07-14 NOTE — Progress Notes (Signed)
Faxed documents to [facility] transfer [facility] representative. Also spoke with charge RN from the MICU at [place] RN advised they did not have any beds, would contact me once they receive more information.

## 2014-07-14 NOTE — Discharge Summary (Addendum)
Physician Discharge Summary  [name] TDV:[id number] DOB: April 11, 1966 DOA: 07/13/2014  PCP: Imelda Pillow, NP  Admit date: 07/13/2014 Discharge date: 07/14/2014  Discharge Diagnoses:  Principal Problem:   Tylenol overdose Active Problems:   Hypokalemia    Discharge Condition: stable   Diet recommendation: as tolerated   History of present illness:  48 year old female with past medical history of depression, seizure disorder who was brought to [facility] ED 07/13/2014 by EMS after overdose on tylenol, carisoprodol, clonazepam and oxycodone. Per EMS report, patient took 75 of 500 mg tylenol, 40 of 350 mg carisoprodol, 15 of 1 mg clonazepam and 40 of 5 mg oxycodone.  On arrival to ED, patient was unresponsive. BP was 96/49, HR 72, RR 15-27, T 95.8 F and she required bear hugger to improve the temperature which then normalized to 98.4 F. Tylenol level was 492, UDS negative, alcohol level normal. She received narcan and romazicon. Poison control was contacted. She was subsequently started on mucomyst.   Assessment & Plan   Active Problems:  Suicidal attempt by drug overdose / abnormal LFT's / metabolic acidosis  Overdose on 75 of 500 mg tylenol, 40 of 350 mg carisoprodol, 15 of 1 mg clonazepam and 40 of 5 mg oxycodone.  Poison control center called and recommended Mucomyst IV 150mg /kg for 1 hour, then 15mg /kg for 23 hours. Repeat LFT's are as follows AST 4861, ALT 6844, bilirubin 1.5. In addition, patient continues to have metabolic acidosis. We will continue mucomyst infusion. Acetaminophen level is pending this morning, order was placed. Per PCCM recommendations we will try to coordinate transfer to [facility] to medical ICU (I already spoke with GI fellow in [facility] and they reccommended transferring the pt to ICU. DVT prophylaxis:  Lovenox subQ ordered   Code Status: Full.  Family Communication: plan of care discussed with the patient  Disposition Plan: remains in ICU. Patient will need  transfer to [facility]/transplant center per PCCM; we are coordinating the transfer. Medical ICU # [phone]; transfer center [phone]. Transfer to [facility].  IV Access:   Peripheral IV Procedures and diagnostic studies:   Dg Chest Portable 1 View 07/13/2014 No acute abnormalities.  Medical Consultants:   PCCM Other Consultants:   None  Anti-Infectives:   None  Signed:  Leisa Lenz, MD  [facility] 07/14/2014, 9:27 AM  Pager #: [phone]   Discharge Exam: Filed Vitals:   07/14/14 0800  BP: 161/77  Pulse: 100  Temp: 99.5 F (37.5 C)  Resp: 18   Filed Vitals:   07/14/14 0500 07/14/14 0600 07/14/14 0700 07/14/14 0800  BP: 161/65 181/63 183/81 161/77  Pulse: 84 78 77 100  Temp: 99.5 F (37.5 C) 99.3 F (37.4 C) 99.3 F (37.4 C) 99.5 F (37.5 C)  TempSrc:      Resp: 20 22 22 18   Height:      Weight:      SpO2: 95% 96% 96% 96%    General: Pt is alert, follows commands appropriately, not in acute distress Cardiovascular: Regular rate and rhythm, S1/S2 +, no murmurs Respiratory: Clear to auscultation bilaterally, no wheezing, no crackles, no rhonchi Abdominal: Soft, non tender, non distended, bowel sounds +, no guarding Extremities: no edema, no cyanosis, pulses palpable bilaterally DP and PT Neuro: Grossly nonfocal  Discharge Instructions  Discharge Instructions   Call MD for:  difficulty breathing, headache or visual disturbances    Complete by:  As directed      Call MD for:  persistant dizziness or light-headedness  Complete by:  As directed      Call MD for:  persistant nausea and vomiting    Complete by:  As directed      Call MD for:  severe uncontrolled pain    Complete by:  As directed      Diet - low sodium heart healthy    Complete by:  As directed      Discharge instructions    Complete by:  As directed   Continue mucomyst     Increase activity slowly    Complete by:  As directed             Medication List    STOP taking these  medications       acetaminophen 325 MG tablet  Commonly known as:  TYLENOL     acetaminophen 500 MG tablet  Commonly known as:  TYLENOL     calcium-vitamin D 500-200 MG-UNIT per tablet  Commonly known as:  OSCAL WITH D     carisoprodol 350 MG tablet  Commonly known as:  SOMA     clonazePAM 1 MG tablet  Commonly known as:  KLONOPIN     multivitamin per tablet     oxyCODONE-acetaminophen 5-325 MG per tablet  Commonly known as:  PERCOCET/ROXICET      TAKE these medications       acetylcysteine 40,000 mg in dextrose 5 % 800 mL  Inject 925.5 mg/hr into the vein continuous.     enoxaparin 40 MG/0.4ML injection  Commonly known as:  LOVENOX  Inject 0.4 mLs (40 mg total) into the skin daily.     ondansetron 4 MG tablet  Commonly known as:  ZOFRAN  Take 1 tablet (4 mg total) by mouth every 6 (six) hours as needed for nausea.           Follow-up Information   Follow up with Millsaps, Luane School, NP. (Follow up appt after recent hospitalization)    Contact information:   [facility] [place] [address] [phone]        The results of significant diagnostics from this hospitalization (including imaging, microbiology, ancillary and laboratory) are listed below for reference.    Significant Diagnostic Studies: Dg Chest Portable 1 View  07/13/2014   CLINICAL DATA:  Multi drug overdose, hypertension, nausea, history smoking  EXAM: PORTABLE CHEST - 1 VIEW  COMPARISON:  Portable exam 1413 hr compared to 10/10/2010  FINDINGS: Normal heart size, mediastinal contours, and pulmonary vascularity.  Lungs clear.  No pleural effusion or pneumothorax.  Bones unremarkable.  IMPRESSION: No acute abnormalities.   Electronically Signed   By: Lavonia Dana M.D.   On: 07/13/2014 14:43    Microbiology: Recent Results (from the past 240 hour(s))  MRSA PCR SCREENING     Status: None   Collection Time    07/13/14  3:39 PM      Result Value Ref Range  Status   MRSA by PCR NEGATIVE  NEGATIVE Final   Comment:            The GeneXpert MRSA Assay (FDA     approved for NASAL specimens     only), is one component of a     comprehensive MRSA colonization     surveillance program. It is not     intended to diagnose MRSA     infection nor to guide or     monitor treatment for     MRSA infections.  Labs: Basic Metabolic Panel:  Recent Labs Lab 07/13/14 1151 07/13/14 1152 07/13/14 1158 07/14/14 0400  NA 140  --  138 143  K 3.2*  --  2.9* 4.1  CL 100  --  107 110  CO2 15*  --   --  14*  GLUCOSE 151*  --  157* 219*  BUN 11  --  10 10  CREATININE 0.26*  --  1.50* 0.45*  CALCIUM 9.6  --   --  8.3*  MG  --  1.9  --   --   PHOS  --  2.0*  --   --    Liver Function Tests:  Recent Labs Lab 07/13/14 1151 07/14/14 0400  AST 188* 6844*  ALT 166* 4861*  ALKPHOS 80 77  BILITOT 0.4 1.5*  PROT 7.6 6.1  ALBUMIN 4.6 3.4*   No results found for this basename: LIPASE, AMYLASE,  in the last 168 hours No results found for this basename: AMMONIA,  in the last 168 hours CBC:  Recent Labs Lab 07/13/14 1151 07/13/14 1158 07/14/14 0400  WBC 6.0  --  20.8*  NEUTROABS 3.8  --   --   HGB 15.4* 16.0* 14.0  HCT 43.8 47.0* 40.2  MCV 90.3  --  90.1  PLT 311  --  116*   Cardiac Enzymes: No results found for this basename: CKTOTAL, CKMB, CKMBINDEX, TROPONINI,  in the last 168 hours BNP: BNP (last 3 results) No results found for this basename: PROBNP,  in the last 8760 hours CBG: No results found for this basename: GLUCAP,  in the last 168 hours  Time coordinating discharge: Over 30 minutes

## 2014-07-14 NOTE — Progress Notes (Addendum)
Patient ID: [name], female   DOB: 1966-08-06, 48 y.o.   MRN: [id number] [facility] PROGRESS NOTE  [name] [id number] DOB: 02/09/1966 DOA: 07/13/2014 PCP: Imelda Pillow, NP  Brief narrative: 48 year old female with past medical history of depression, seizure disorder who was brought to [facility] ED 07/13/2014 by EMS after overdose on tylenol, carisoprodol, clonazepam and oxycodone.  Per EMS report, patient took 75 of 500 mg tylenol, 40 of 350 mg carisoprodol, 15 of 1 mg clonazepam and 40 of 5 mg oxycodone.  On arrival to ED, patient was unresponsive. BP was 96/49, HR 72, RR 15-27, T 95.8 F and she required bear hugger to improve the temperature which then normalized to 98.4 F. Tylenol level was 492, UDS negative, alcohol level normal. She received narcan and romazicon. Poison control was contacted. She was subsequently started on mucomyst.   Assessment & Plan    Active Problems:  Suicidal attempt by drug overdose / abnormal LFT's / metabolic acidosis Overdose on 75 of 500 mg tylenol, 40 of 350 mg carisoprodol, 15 of 1 mg clonazepam and 40 of 5 mg oxycodone.  Poison control center called and recommended Mucomyst IV 150mg /kg for 1 hour, then 15mg /kg for 23 hours. Repeat LFT's are as follows AST 4861, ALT 6844, bilirubin 1.5. In addition, patient continues to have metabolic acidosis. We will continue mucomyst infusion. Per PCCM recommendations we will try to coordinate transfer to [facility] to medical ICU (I already spoke with GI fellow in [facility] and they reccommended transferring the pt to ICU.  DVT prophylaxis:  Lovenox subQ ordered    Code Status: Full.  Family Communication:  plan of care discussed with the patient Disposition Plan: remains in ICU. Patient will need transfer to [facility]/transplant center per PCCM; we are coordinating the transfer. Medical ICU # [phone]; transfer center [phone]   IV Access:   Peripheral IV Procedures and diagnostic studies:    Dg Chest  Portable 1 View 07/13/2014  No acute abnormalities.    Medical Consultants:   PCCM Other Consultants:   None  Anti-Infectives:   None  Leisa Lenz, MD  [facility] Pager [phone]  If 7PM-7AM, please contact night-coverage [email or web address] Password [email or web address] 07/14/2014, 9:01 AM   LOS: 1 day    HPI/Subjective: No acute overnight events.  Objective: Filed Vitals:   07/14/14 0500 07/14/14 0600 07/14/14 0700 07/14/14 0800  BP: 161/65 181/63 183/81 161/77  Pulse: 84 78 77 100  Temp: 99.5 F (37.5 C) 99.3 F (37.4 C) 99.3 F (37.4 C) 99.5 F (37.5 C)  TempSrc:      Resp: 20 22 22 18   Height:      Weight:      SpO2: 95% 96% 96% 96%    Intake/Output Summary (Last 24 hours) at 07/14/14 0901 Last data filed at 07/14/14 0800  Gross per 24 hour  Intake 6590.8 ml  Output   4625 ml  Net 1965.8 ml    Exam:   General:  Pt is alert, follows commands appropriately, not in acute distress  Cardiovascular: Regular rate and rhythm, S1/S2 appreciated   Respiratory: Clear to auscultation bilaterally, no wheezing, no crackles, no rhonchi  Abdomen: Soft, tender to palpation in upper abdomen, non distended, bowel sounds present  Extremities: No edema, pulses DP and PT palpable bilaterally  Neuro: Grossly nonfocal  Data Reviewed: Basic Metabolic Panel:  Recent Labs Lab 07/13/14 1151 07/13/14 1152 07/13/14 1158 07/14/14 0400  NA 140  --  138 143  K  3.2*  --  2.9* 4.1  CL 100  --  107 110  CO2 15*  --   --  14*  GLUCOSE 151*  --  157* 219*  BUN 11  --  10 10  CREATININE 0.26*  --  1.50* 0.45*  CALCIUM 9.6  --   --  8.3*  MG  --  1.9  --   --   PHOS  --  2.0*  --   --    Liver Function Tests:  Recent Labs Lab 07/13/14 1151 07/14/14 0400  AST 188* 6844*  ALT 166* 4861*  ALKPHOS 80 77  BILITOT 0.4 1.5*  PROT 7.6 6.1  ALBUMIN 4.6 3.4*   No results found for this basename: LIPASE, AMYLASE,  in the last 168 hours No results found for this basename: AMMONIA,   in the last 168 hours CBC:  Recent Labs Lab 07/13/14 1151 07/13/14 1158 07/14/14 0400  WBC 6.0  --  20.8*  NEUTROABS 3.8  --   --   HGB 15.4* 16.0* 14.0  HCT 43.8 47.0* 40.2  MCV 90.3  --  90.1  PLT 311  --  116*   Cardiac Enzymes: No results found for this basename: CKTOTAL, CKMB, CKMBINDEX, TROPONINI,  in the last 168 hours BNP: No components found with this basename: POCBNP,  CBG: No results found for this basename: GLUCAP,  in the last 168 hours  MRSA PCR SCREENING     Status: None   Collection Time    07/13/14  3:39 PM      Result Value Ref Range Status   MRSA by PCR NEGATIVE  NEGATIVE Final     Scheduled Meds: . enoxaparin (LOVENOX) injection  40 mg Subcutaneous Q24H   Continuous Infusions: . sodium chloride 1,000 mL (07/13/14 1341)  . acetylcysteine 15 mg/kg/hr (07/13/14 1350)

## 2014-07-14 NOTE — Progress Notes (Signed)
Acetaminophen level of 25.3 ug/ml called to MD and re-test was ordered stat for accuracy.

## 2014-07-15 DIAGNOSIS — R6510 Systemic inflammatory response syndrome (SIRS) of non-infectious origin without acute organ dysfunction: Secondary | ICD-10-CM | POA: Diagnosis not present

## 2014-07-15 DIAGNOSIS — A419 Sepsis, unspecified organism: Secondary | ICD-10-CM

## 2014-07-15 DIAGNOSIS — R7989 Other specified abnormal findings of blood chemistry: Secondary | ICD-10-CM

## 2014-07-15 LAB — CBC WITH DIFFERENTIAL/PLATELET
Basophils Absolute: 0 10*3/uL (ref 0.0–0.1)
Basophils Relative: 0 % (ref 0–1)
EOS ABS: 0 10*3/uL (ref 0.0–0.7)
EOS PCT: 0 % (ref 0–5)
HEMATOCRIT: 39.9 % (ref 36.0–46.0)
Hemoglobin: 13.8 g/dL (ref 12.0–15.0)
LYMPHS ABS: 1.3 10*3/uL (ref 0.7–4.0)
Lymphocytes Relative: 8 % — ABNORMAL LOW (ref 12–46)
MCH: 30.7 pg (ref 26.0–34.0)
MCHC: 34.6 g/dL (ref 30.0–36.0)
MCV: 88.7 fL (ref 78.0–100.0)
Monocytes Absolute: 0.5 10*3/uL (ref 0.1–1.0)
Monocytes Relative: 3 % (ref 3–12)
Neutro Abs: 13.8 10*3/uL — ABNORMAL HIGH (ref 1.7–7.7)
Neutrophils Relative %: 89 % — ABNORMAL HIGH (ref 43–77)
PLATELETS: 176 10*3/uL (ref 150–400)
RBC: 4.5 MIL/uL (ref 3.87–5.11)
RDW: 12.7 % (ref 11.5–15.5)
WBC: 15.6 10*3/uL — ABNORMAL HIGH (ref 4.0–10.5)

## 2014-07-15 LAB — PROTIME-INR
INR: 4.02 — ABNORMAL HIGH (ref 0.00–1.49)
Prothrombin Time: 39.4 seconds — ABNORMAL HIGH (ref 11.6–15.2)

## 2014-07-15 LAB — BASIC METABOLIC PANEL
Anion gap: 16 — ABNORMAL HIGH (ref 5–15)
BUN: 7 mg/dL (ref 6–23)
CO2: 20 mEq/L (ref 19–32)
CREATININE: 0.54 mg/dL (ref 0.50–1.10)
Calcium: 8.8 mg/dL (ref 8.4–10.5)
Chloride: 108 mEq/L (ref 96–112)
GFR calc Af Amer: >90 mL/min (ref >90)
Glucose, Bld: 148 mg/dL — ABNORMAL HIGH (ref 70–99)
Potassium: 2.8 mEq/L — CL (ref 3.7–5.3)
SODIUM: 144 meq/L (ref 137–147)

## 2014-07-15 LAB — HEPATIC FUNCTION PANEL
ALT: 9894 U/L — ABNORMAL HIGH (ref 0–35)
AST: 8434 U/L — ABNORMAL HIGH (ref 0–37)
Albumin: 3.4 g/dL — ABNORMAL LOW (ref 3.5–5.2)
Alkaline Phosphatase: 102 U/L (ref 39–117)
BILIRUBIN TOTAL: 1.7 mg/dL — AB (ref 0.3–1.2)
Bilirubin, Direct: 0.9 mg/dL — ABNORMAL HIGH (ref 0.0–0.3)
Indirect Bilirubin: 0.8 mg/dL (ref 0.3–0.9)
Total Protein: 6 g/dL (ref 6.0–8.3)

## 2014-07-15 LAB — GLUCOSE, CAPILLARY: Glucose-Capillary: 128 mg/dL — ABNORMAL HIGH (ref 70–99)

## 2014-07-15 LAB — PHOSPHORUS: Phosphorus: 1.8 mg/dL — ABNORMAL LOW (ref 2.3–4.6)

## 2014-07-15 LAB — MAGNESIUM: Magnesium: 1.9 mg/dL (ref 1.5–2.5)

## 2014-07-15 LAB — ACETAMINOPHEN LEVEL

## 2014-07-15 LAB — LACTIC ACID, PLASMA: LACTIC ACID, VENOUS: 2.6 mmol/L — AB (ref 0.5–2.2)

## 2014-07-15 MED ORDER — PANTOPRAZOLE SODIUM 40 MG IV SOLR
40.0000 mg | Freq: Every day | INTRAVENOUS | Status: DC
  Administered 2014-07-15: 40 mg via INTRAVENOUS
  Filled 2014-07-15: qty 40

## 2014-07-15 MED ORDER — OXYCODONE HCL 5 MG PO TABS
5.0000 mg | ORAL_TABLET | Freq: Four times a day (QID) | ORAL | Status: DC | PRN
  Administered 2014-07-15: 5 mg via ORAL
  Filled 2014-07-15 (×3): qty 1

## 2014-07-15 MED ORDER — OXYCODONE HCL 5 MG PO TABS
10.0000 mg | ORAL_TABLET | Freq: Once | ORAL | Status: AC
Start: 2014-07-15 — End: 2014-07-15
  Administered 2014-07-15: 10 mg via ORAL

## 2014-07-15 MED ORDER — POTASSIUM CHLORIDE CRYS ER 20 MEQ PO TBCR
40.0000 meq | EXTENDED_RELEASE_TABLET | ORAL | Status: AC
  Administered 2014-07-15: 40 meq via ORAL
  Filled 2014-07-15: qty 2

## 2014-07-15 MED ORDER — VITAMIN K1 10 MG/ML IJ SOLN
5.0000 mg | Freq: Once | INTRAMUSCULAR | Status: DC
  Filled 2014-07-15: qty 0.5

## 2014-07-15 MED ORDER — VITAMIN K1 10 MG/ML IJ SOLN
10.0000 mg | Freq: Once | INTRAVENOUS | Status: AC
  Administered 2014-07-15: 10 mg via INTRAVENOUS
  Filled 2014-07-15: qty 1

## 2014-07-15 MED ORDER — HYDRALAZINE HCL 20 MG/ML IJ SOLN
10.0000 mg | Freq: Four times a day (QID) | INTRAMUSCULAR | Status: DC | PRN
  Administered 2014-07-15: 10 mg via INTRAVENOUS
  Filled 2014-07-15: qty 1

## 2014-07-15 MED ORDER — METOCLOPRAMIDE HCL 5 MG/ML IJ SOLN
5.0000 mg | Freq: Once | INTRAMUSCULAR | Status: AC
  Administered 2014-07-15: 5 mg via INTRAVENOUS
  Filled 2014-07-15: qty 2

## 2014-07-15 MED ORDER — POTASSIUM CHLORIDE 20 MEQ/15ML (10%) PO LIQD: 40.0000 meq | Freq: Once | ORAL | Status: DC

## 2014-07-15 MED ORDER — POTASSIUM CHLORIDE CRYS ER 20 MEQ PO TBCR: 40.0000 meq | EXTENDED_RELEASE_TABLET | Freq: Once | ORAL | Status: DC

## 2014-07-15 NOTE — Progress Notes (Addendum)
[facility] PROGRESS NOTE  ALEIGHA GILANI JOA:[id number] DOB: 1966/08/05 DOA: 07/13/2014 PCP: Imelda Pillow, NP   BRIEF NARRATIVE 48 year old female with history of depression, seizure disorder brought to [facility] ED on 07/13/2014 for  overdose on tylenol, carisoprodol, clonazepam and oxycodone. Per EMS report, patient took 75 of 500 mg tylenol, 40 of 350 mg carisoprodol, 15 of 1 mg clonazepam and 40 of 5 mg oxycodone.  patient was unresponsive on arrival to ED. BP was 96/49, HR 72, RR 15-27, T 95.8 F. she required bear hugger to improve the temperature which then normalized to 98.4 F. Tylenol level was 492, UDS negative, alcohol level normal. She received narcan and romazicon. Poison control was contacted. She was subsequently started on mucomyst and admitted to ICU.  Patient planned for transfer to [facility] ICU given her acute overdose, worsening LFTs and INR .  Assessment/Plan: Suicidal attempt by drug overdose / abnormal LFT's / metabolic acidosis  -Overdosed on 75 of 500 mg tylenol, 40 of 350 mg carisoprodol, 15 of 1 mg clonazepam and 40 of 5 mg oxycodone.  -Poison control center called and recommended Mucomyst IV 150mg /kg for 1 hour, then 15mg /kg for 23 hours. Follow up LFTs this am worsening with AST of 8434 and ALT of 9894, total bili of 1.7, bilirubin 1.5. AG of 16. INR progressively elevated to 4.17. Ordered 5 mg sq vitamin k. -transfer to [facility] pending. awaiting bed availability in the ICU. -no signs of encephalopathy or bleeding. -prn zofran for nausea.  Monitor LFTs and INR closely.  SIRS Temp of 100.92F  Overnight with wbc of 15.9k . UA and CXR on admission normal. Will order blood cx. Follow closely  depression Patient  still reports to be  depressed but denies suicidal ideations. Needs psych eval once acute issues resolve.  Hypokalemia Replenished  abdominal pain Possibly gastritis vs acute hepatitis. No signs of GI bleed or obstruction. Monitor      DVT prophylaxis:   SCD  Diet: regular  Code Status: Full.  Family Communication: plan of care discussed with the patient  Disposition Plan: remains in ICU.  transfer to [facility]/transplant center once ICU bed available ( called again this am and was told we will be notified once bed opens up)   Medical ICU # [phone]; transfer center [phone].    Consultants:  PCCM  Procedures:  none  Antibiotics:  NONE  HPI/Subjective: Seen and examined . Reports abdominal pain and nausea. No vomiting. Still feels depressed.   Objective: Filed Vitals:   07/15/14 0500  BP: 166/77  Pulse: 70  Temp: 100.4 F (38 C)  Resp: 22    Intake/Output Summary (Last 24 hours) at 07/15/14 0805 Last data filed at 07/15/14 0214  Gross per 24 hour  Intake   1841 ml  Output   2375 ml  Net   -534 ml   Filed Weights   07/13/14 1600 07/14/14 0341 07/15/14 0500  Weight: 65.1 kg (143 lb 8.3 oz) 64 kg (141 lb 1.5 oz) 63.368 kg (139 lb 11.2 oz)    Exam:   General:  NAD  HEENT: no pallor, no icterus, moist mucosa  Cardiovascular:  NS1&S2, no murmurs  Respiratory: clear b/l  Abdomen: soft, epigastric tenderness, ND, BS+  Musculoskeletal: Warm, no edema   CNS: alert and oriented.    Data Reviewed: Basic Metabolic Panel:  Recent Labs Lab 07/13/14 1151 07/13/14 1152 07/13/14 1158 07/14/14 0400 07/14/14 1627 07/15/14 0534  NA 140  --  138 143 146  144  K 3.2*  --  2.9* 4.1 3.2* 2.8*  CL 100  --  107 110 110 108  CO2 15*  --   --  14* 19 20  GLUCOSE 151*  --  157* 219* 169* 148*  BUN 11  --  10 10 7 7   CREATININE 0.26*  --  1.50* 0.45* 0.46* 0.54  CALCIUM 9.6  --   --  8.3* 8.7 8.8  MG  --  1.9  --   --   --  1.9  PHOS  --  2.0*  --   --   --  1.8*   Liver Function Tests:  Recent Labs Lab 07/13/14 1151 07/14/14 0400 07/14/14 1627 07/15/14 0534  AST 188* 6844* 6448* 8434*  ALT 166* 4861* 6779* 9894*  ALKPHOS 80 77 94 102  BILITOT 0.4 1.5* 1.9* 1.7*  PROT 7.6 6.1 6.4 6.0  ALBUMIN  4.6 3.4* 3.7 3.4*   No results found for this basename: LIPASE, AMYLASE,  in the last 168 hours No results found for this basename: AMMONIA,  in the last 168 hours CBC:  Recent Labs Lab 07/13/14 1151 07/13/14 1158 07/14/14 0400 07/15/14 0534  WBC 6.0  --  20.8* 15.6*  NEUTROABS 3.8  --   --  13.8*  HGB 15.4* 16.0* 14.0 13.8  HCT 43.8 47.0* 40.2 39.9  MCV 90.3  --  90.1 88.7  PLT 311  --  116* 176   Cardiac Enzymes: No results found for this basename: CKTOTAL, CKMB, CKMBINDEX, TROPONINI,  in the last 168 hours BNP (last 3 results) No results found for this basename: PROBNP,  in the last 8760 hours CBG: No results found for this basename: GLUCAP,  in the last 168 hours  Recent Results (from the past 240 hour(s))  URINE CULTURE     Status: None   Collection Time    07/13/14 12:16 PM      Result Value Ref Range Status   Specimen Description URINE, CATHETERIZED   Final   Special Requests NONE   Final   Culture  Setup Time     Final   Value: 07/13/2014 18:53     Performed at [place]     Final   Value: NO GROWTH     Performed at [facility]   Culture     Final   Value: NO GROWTH     Performed at [facility]   Report Status 07/14/2014 FINAL   Final  MRSA PCR SCREENING     Status: None   Collection Time    07/13/14  3:39 PM      Result Value Ref Range Status   MRSA by PCR NEGATIVE  NEGATIVE Final   Comment:            The GeneXpert MRSA Assay (FDA     approved for NASAL specimens     only), is one component of a     comprehensive MRSA colonization     surveillance program. It is not     intended to diagnose MRSA     infection nor to guide or     monitor treatment for     MRSA infections.     Studies: Dg Chest Portable 1 View  07/13/2014   CLINICAL DATA:  Multi drug overdose, hypertension, nausea, history smoking  EXAM: PORTABLE CHEST - 1 VIEW  COMPARISON:  Portable exam 1413 hr compared to 10/10/2010  FINDINGS: Normal  heart size, mediastinal contours, and pulmonary vascularity.  Lungs clear.  No pleural effusion or pneumothorax.  Bones unremarkable.  IMPRESSION: No acute abnormalities.   Electronically Signed   By: Lavonia Dana M.D.   On: 07/13/2014 14:43    Scheduled Meds: . potassium chloride  40 mEq Per Tube Once   Continuous Infusions: . sodium chloride 1,000 mL (07/14/14 1300)  . acetylcysteine 15 mg/kg/hr (07/13/14 1350)     Time spent: [place]  [facility] Pager [phone] If 7PM-7AM, please contact night-coverage at [email or web address], password [facility] 07/15/2014, 8:05 AM  LOS: 2 days

## 2014-07-15 NOTE — Progress Notes (Signed)
MEDICATION RELATED CONSULT NOTE - FOLLOW UP  Pharmacy Consult for Acetylcysteine IV  Indication: Acetaminophen overdose  Allergies  Allergen Reactions  . Aspirin Anaphylaxis  . Doxycycline Anaphylaxis  . Sulfa Antibiotics Anaphylaxis  . Imitrex [Sumatriptan Base] Other (See Comments)    Blood Pressure goes high  . Iodinated Diagnostic Agents   . Levofloxacin Other (See Comments)    Joints hurt  . Prednisone Other (See Comments)    She goes crazy  . Tramadol Other (See Comments)    seizures  . Latex Rash  . Nsaids Rash    Patient Measurements: Height: 5\' 1"  (154.9 cm) Weight: 139 lb 11.2 oz (63.368 kg) IBW/kg (Calculated) : 47.8  Vital Signs: Temp: 100.6 F (38.1 C) (10/26 0800) BP: 134/74 mmHg (10/26 0800) Pulse Rate: 70 (10/26 0800) Intake/Output from previous day: 10/25 0701 - 10/26 0700 In: 2109.1 [P.O.:480; I.V.:1629.1] Out: 2725 [Urine:2725] Intake/Output from this shift: Total I/O In: -  Out: 1200 [Urine:1200]  Labs:  Recent Labs  07/13/14 1151 07/13/14 1152 07/13/14 1158 07/14/14 0400 07/14/14 1019 07/14/14 1627 07/15/14 0534  WBC 6.0  --   --  20.8*  --   --  15.6*  HGB 15.4*  --  16.0* 14.0  --   --  13.8  HCT 43.8  --  47.0* 40.2  --   --  39.9  PLT 311  --   --  116*  --   --  176  APTT  --  29  --   --  35  --   --   CREATININE 0.26*  --  1.50* 0.45*  --  0.46* 0.54  MG  --  1.9  --   --   --   --  1.9  PHOS  --  2.0*  --   --   --   --  1.8*  ALBUMIN 4.6  --   --  3.4*  --  3.7 3.4*  PROT 7.6  --   --  6.1  --  6.4 6.0  AST 188*  --   --  6844*  --  6448* 8434*  ALT 166*  --   --  4861*  --  6779* 9894*  ALKPHOS 80  --   --  77  --  94 102  BILITOT 0.4  --   --  1.5*  --  1.9* 1.7*  BILIDIR  --   --   --   --   --   --  0.9*  IBILI  --   --   --   --   --   --  0.8   Estimated Creatinine Clearance: 74.1 ml/min (by C-G formula based on Cr of 0.54).    Medications:  Scheduled:  . metoCLOPramide (REGLAN) injection  5 mg Intravenous  Once  . pantoprazole (PROTONIX) IV  40 mg Intravenous Daily  . phytonadione (VITAMIN K) IV  10 mg Intravenous Once  . potassium chloride  40 mEq Oral 6 times per day   Infusions:  . sodium chloride 1,000 mL (07/14/14 1300)  . acetylcysteine 15 mg/kg/hr (07/13/14 1350)    Assessment: 48 y/o F admitted 10/24 after suicide attempt by drug overdose (reportedly 75 tabs of Tylenol 500mg , 40 tabs of carisoprodol 350mg , 15 tabs of clonazepam 1mg , 40 tabs of oxycodone 5mg ).  She was unresponsive on arrival to ED, awoke after naloxone and flumazenil.  Her initial APAP level was 491.9 with timing of ingestion uncertain. IV NAC was started  per recommendations from [facility].  Pharmacy was consulted for assistance with lab ordering and follow-up.  Today, 10/26:  AST/ALT increased, 8434/9894  INR increased, 4.02  T bili 1.7  Lactic acid 2.6  K 2.8, replacing per MD  [facility] updated at 0900.     Plan:   Continue acetylcysteine IV infusion at 15 mg/kg/hr (23.1 ml/hr).  Vitamin K 10mg  IV per MD dosing and toxicologist recommendations.  Recheck AM labs: CMET, INR, APAP level  Gretta Arab PharmD, BCPS Pager [phone] 07/15/2014 9:01 AM

## 2014-07-15 NOTE — Progress Notes (Signed)
Name: [name] MRN: [id number] DOB: 1966-02-26    ADMISSION DATE:  07/13/2014 CONSULTATION DATE:  07/15/2014  REFERRING MD :  Audie Pinto  CHIEF COMPLAINT:  Overdose  HISTORY OF PRESENT ILLNESS:  48 year old with seizure disorder and depression presents with intentional overdose.  Significant tylenol OD (37 grams)  SUBJECTIVE: Calm , no complaints  VITAL SIGNS: Temp:  [99.7 F (37.6 C)-100.9 F (38.3 C)] 100.6 F (38.1 C) (10/26 0800) Pulse Rate:  [69-89] 70 (10/26 0800) Resp:  [13-26] 21 (10/26 0800) BP: (134-189)/(47-90) 134/74 mmHg (10/26 0800) SpO2:  [96 %-98 %] 97 % (10/26 0800) Weight:  [139 lb 11.2 oz (63.368 kg)] 139 lb 11.2 oz (63.368 kg) (10/26 0500)  PHYSICAL EXAMINATION: Gen. Pleasant, well-nourished, in no distress, anxious ENT - no lesions, no jvd Neck: No JVD/LAN Lungs: no use of accessory muscles, CTA Cardiovascular: Rhythm regular, heart sounds  normal, no murmurs. Abdomen: soft and non-tender. Musculoskeletal: No deformities, no cyanosis or clubbing Neuro:  alert, non focal Skin:  Warm, no lesions/ rash    Recent Labs Lab 07/14/14 0400 07/14/14 1627 07/15/14 0534  NA 143 146 144  K 4.1 3.2* 2.8*  CL 110 110 108  CO2 14* 19 20  BUN 10 7 7   CREATININE 0.45* 0.46* 0.54  GLUCOSE 219* 169* 148*    Recent Labs Lab 07/13/14 1151 07/13/14 1158 07/14/14 0400 07/15/14 0534  HGB 15.4* 16.0* 14.0 13.8  HCT 43.8 47.0* 40.2 39.9  WBC 6.0  --  20.8* 15.6*  PLT 311  --  116* 176   Lab Results  Component Value Date   INR 4.02* 07/15/2014   INR 3.14* 07/14/2014   INR 1.20 07/13/2014   Hepatic Function Panel     Component Value Date/Time   PROT 6.0 07/15/2014 0534   ALBUMIN 3.4* 07/15/2014 0534   AST 8434* 07/15/2014 0534   ALT 9894* 07/15/2014 0534   ALKPHOS 102 07/15/2014 0534   BILITOT 1.7* 07/15/2014 0534   BILIDIR 0.9* 07/15/2014 0534   IBILI 0.8 07/15/2014 0534    Dg Chest Portable 1 View  07/13/2014   CLINICAL DATA:  Multi  drug overdose, hypertension, nausea, history smoking  EXAM: PORTABLE CHEST - 1 VIEW  COMPARISON:  Portable exam 1413 hr compared to 10/10/2010  FINDINGS: Normal heart size, mediastinal contours, and pulmonary vascularity.  Lungs clear.  No pleural effusion or pneumothorax.  Bones unremarkable.  IMPRESSION: No acute abnormalities.   Electronically Signed   By: Lavonia Dana M.D.   On: 07/13/2014 14:43     ASSESSMENT / PLAN:  Overdose of benzos/narcotics/acetaminophen Liver failure from tylenol OD (high risk fulminant, although currently does not meet [facility] criteria for urgent tx) Transaminitis Anion gap acidosis - from tylenol OD (10/26 AG 18) Thrombocytopenia from liver failure Hypokalemia Hepatic coagulopathy  Recommend - Vitamin K 10/26 Follow INR Follow transminases and renal fxn Continue n-AC infusion Agree with beginning process to transfer to [facility] for transplant eval, certainly she could worsen quickly and meet criteria for transplant.  Watch neuro status closely for evolving encephalopathy Safety sitter at bedside   [facility] Minor ACNP [place] Pager [phone] till 3 pm If no answer page [phone] 07/15/2014, 10:23 AM   Attending Note:  35 minutes independent CC time. Examined pt and reviewed data. I agree with the notes above as amended. She does not meet [facility] criteria for tx at this time. MELD on 10/26 = 24 (39% mortality at 3 months). She will likely progress although  I am hopeful that n-AC was started in time to prevent fulminant failure. I believe she should go to a transplant-ready facility given high risk for worsening. Dr Clementeen Graham is working on transfer to [facility].   Baltazar Apo, MD, PhD 07/15/2014, 11:44 AM [place] [facility] [phone] or if no answer [phone]

## 2014-07-15 NOTE — Progress Notes (Signed)
Review from [facility]   . sodium chloride 1,000 mL (07/14/14 1300)  . acetylcysteine 15 mg/kg/hr (07/13/14 1350)    Camera exam  - looks stable  Labs  . PULMONARY  Recent Labs Lab 07/13/14 1158 07/13/14 1300 07/14/14 0905 07/14/14 1900  PHART  --  7.419 7.444 7.452*  PCO2ART  --  23.2* 22.3* 27.7*  PO2ART  --  105.0* 102.0* 54.1*  HCO3  --  15.1* 15.1* 19.1*  TCO2 16 13.7 13.0 16.5  O2SAT  --  98.1 97.5 88.0    CBC  Recent Labs Lab 07/13/14 1151 07/13/14 1158 07/14/14 0400  HGB 15.4* 16.0* 14.0  HCT 43.8 47.0* 40.2  WBC 6.0  --  20.8*  PLT 311  --  116*    COAGULATION  Recent Labs Lab 07/13/14 1410 07/14/14 1019  INR 1.20 3.14*    CARDIAC  No results found for this basename: TROPONINI,  in the last 168 hours No results found for this basename: PROBNP,  in the last 168 hours   CHEMISTRY  Recent Labs Lab 07/13/14 1151 07/13/14 1152 07/13/14 1158 07/14/14 0400 07/14/14 1627  NA 140  --  138 143 146  K 3.2*  --  2.9* 4.1 3.2*  CL 100  --  107 110 110  CO2 15*  --   --  14* 19  GLUCOSE 151*  --  157* 219* 169*  BUN 11  --  10 10 7   CREATININE 0.26*  --  1.50* 0.45* 0.46*  CALCIUM 9.6  --   --  8.3* 8.7  MG  --  1.9  --   --   --   PHOS  --  2.0*  --   --   --    Estimated Creatinine Clearance: 74.5 ml/min (by C-G formula based on Cr of 0.46).   LIVER  Recent Labs Lab 07/13/14 1151 07/13/14 1410 07/14/14 0400 07/14/14 1019 07/14/14 1627  AST 188*  --  6844*  --  6448*  ALT 166*  --  4861*  --  6779*  ALKPHOS 80  --  77  --  94  BILITOT 0.4  --  1.5*  --  1.9*  PROT 7.6  --  6.1  --  6.4  ALBUMIN 4.6  --  3.4*  --  3.7  INR  --  1.20  --  3.14*  --      INFECTIOUS  Recent Labs Lab 07/14/14 1019  LATICACIDVEN 3.0*     ENDOCRINE CBG (last 3)  No results found for this basename: GLUCAP,  in the last 72 hours       IMAGING x48h Dg Chest Portable 1 View  07/13/2014   CLINICAL DATA:  Multi drug overdose,  hypertension, nausea, history smoking  EXAM: PORTABLE CHEST - 1 VIEW  COMPARISON:  Portable exam 1413 hr compared to 10/10/2010  FINDINGS: Normal heart size, mediastinal contours, and pulmonary vascularity.  Lungs clear.  No pleural effusion or pneumothorax.  Bones unremarkable.  IMPRESSION: No acute abnormalities.   Electronically Signed   By: Lavonia Dana M.D.   On: 07/13/2014 14:43     A) Tylenol OD with rising INR  No labs this AM  P) Dc lovenox Stat cbcd, bmet, inr, lactic acid, lft,    Dr. Brand Males, M.D., [facility].C.P Pulmonary and Critical Care Medicine Staff Physician [place] [facility] Pager: [phone], If no answer or between  15:00h - 7:00h: call [phone]  [phone]  07/15/2014 5:04 AM

## 2014-07-15 NOTE — Progress Notes (Signed)
   Lab review   Recent Labs Lab 07/14/14 1019 07/15/14 0534  LATICACIDVEN 3.0* 2.6*   Liver  - shows worsening INR .  Recent Labs Lab 07/13/14 1151 07/13/14 1410 07/14/14 0400 07/14/14 1019 07/14/14 1627 07/15/14 0534  AST 188*  --  6844*  --  6448* PENDING  ALT 166*  --  4861*  --  6779* PENDING  ALKPHOS 80  --  77  --  94 102  BILITOT 0.4  --  1.5*  --  1.9* 1.7*  PROT 7.6  --  6.1  --  6.4 6.0  ALBUMIN 4.6  --  3.4*  --  3.7 3.4*  INR  --  1.20  --  3.14*  --  4.02*    Lytes  low K,  Recent Labs Lab 07/13/14 1151 07/13/14 1152 07/13/14 1158 07/14/14 0400 07/14/14 1627 07/15/14 0534  NA 140  --  138 143 146 144  K 3.2*  --  2.9* 4.1 3.2* 2.8*  CL 100  --  107 110 110 108  CO2 15*  --   --  14* 19 20  GLUCOSE 151*  --  157* 219* 169* 148*  BUN 11  --  10 10 7 7   CREATININE 0.26*  --  1.50* 0.45* 0.46* 0.54  CALCIUM 9.6  --   --  8.3* 8.7 8.8  MG  --  1.9  --   --   --   --   PHOS  --  2.0*  --   --   --   --    A Tylenol toxicity with coagulopathy Doubt meeting [facility] criteria for transfer but trend worrisome  Plan  replete K orally Might need CVL   Dr. Brand Males, M.D., [facility].C.P Pulmonary and Critical Care Medicine Staff Physician [place] [facility] Pager: [phone], If no answer or between  15:00h - 7:00h: call [phone]  07/15/2014 7:03 AM

## 2014-07-21 LAB — CULTURE, BLOOD (ROUTINE X 2): Culture: NO GROWTH

## 2014-08-20 ENCOUNTER — Encounter (HOSPITAL_COMMUNITY): Payer: Self-pay | Admitting: *Deleted

## 2014-08-20 ENCOUNTER — Emergency Department (HOSPITAL_COMMUNITY)
Admission: EM | Admit: 2014-08-20 | Discharge: 2014-08-20 | Discharge status: Against Medical Advice | Payer: MEDICAID | Attending: Emergency Medicine | Admitting: Emergency Medicine

## 2014-08-20 DIAGNOSIS — Z720 Tobacco use: Secondary | ICD-10-CM | POA: Insufficient documentation

## 2014-08-20 DIAGNOSIS — R1110 Vomiting, unspecified: Secondary | ICD-10-CM | POA: Insufficient documentation

## 2014-08-20 DIAGNOSIS — R000 Tachycardia, unspecified: Secondary | ICD-10-CM | POA: Insufficient documentation

## 2014-08-20 DIAGNOSIS — I10 Essential (primary) hypertension: Secondary | ICD-10-CM | POA: Insufficient documentation

## 2014-08-20 DIAGNOSIS — R197 Diarrhea, unspecified: Secondary | ICD-10-CM | POA: Insufficient documentation

## 2014-08-20 LAB — CBC WITH DIFFERENTIAL/PLATELET
Basophils Absolute: 0 10*3/uL (ref 0.0–0.1)
Basophils Relative: 0 % (ref 0–1)
EOS PCT: 1 % (ref 0–5)
Eosinophils Absolute: 0.1 10*3/uL (ref 0.0–0.7)
HCT: 46.3 % — ABNORMAL HIGH (ref 36.0–46.0)
HEMOGLOBIN: 17 g/dL — AB (ref 12.0–15.0)
Lymphocytes Relative: 37 % (ref 12–46)
Lymphs Abs: 3.9 10*3/uL (ref 0.7–4.0)
MCH: 32.5 pg (ref 26.0–34.0)
MCHC: 36.7 g/dL — AB (ref 30.0–36.0)
MCV: 88.5 fL (ref 78.0–100.0)
MONOS PCT: 5 % (ref 3–12)
Monocytes Absolute: 0.5 10*3/uL (ref 0.1–1.0)
NEUTROS ABS: 6.1 10*3/uL (ref 1.7–7.7)
Neutrophils Relative %: 57 % (ref 43–77)
Platelets: 304 10*3/uL (ref 150–400)
RBC: 5.23 MIL/uL — ABNORMAL HIGH (ref 3.87–5.11)
RDW: 12.6 % (ref 11.5–15.5)
WBC: 10.6 10*3/uL — ABNORMAL HIGH (ref 4.0–10.5)

## 2014-08-20 LAB — COMPREHENSIVE METABOLIC PANEL
ALBUMIN: 5 g/dL (ref 3.5–5.2)
ALT: 28 U/L (ref 0–35)
AST: 36 U/L (ref 0–37)
Alkaline Phosphatase: 90 U/L (ref 39–117)
Anion gap: 23 — ABNORMAL HIGH (ref 5–15)
BILIRUBIN TOTAL: 0.5 mg/dL (ref 0.3–1.2)
BUN: 15 mg/dL (ref 6–23)
CHLORIDE: 89 meq/L — AB (ref 96–112)
CO2: 18 mEq/L — ABNORMAL LOW (ref 19–32)
Calcium: 10.2 mg/dL (ref 8.4–10.5)
Creatinine, Ser: 0.65 mg/dL (ref 0.50–1.10)
GFR calc Af Amer: >90 mL/min (ref >90)
GFR calc non Af Amer: >90 mL/min (ref >90)
Glucose, Bld: 91 mg/dL (ref 70–99)
Potassium: 5.2 mEq/L (ref 3.7–5.3)
Sodium: 130 mEq/L — ABNORMAL LOW (ref 137–147)
Total Protein: 8.6 g/dL — ABNORMAL HIGH (ref 6.0–8.3)

## 2014-08-20 LAB — LIPASE, BLOOD: LIPASE: 23 U/L (ref 11–59)

## 2014-08-20 LAB — CBG MONITORING, ED: GLUCOSE-CAPILLARY: 87 mg/dL (ref 70–99)

## 2014-08-20 NOTE — ED Notes (Signed)
Referred [facility]. 3-4 days of n/v/diarrheea, week, elevated ketones;Pt. Was d/c 3 weeks from [facility] for liver problems. Possible liver transplant canidate.

## 2014-08-21 LAB — I-STAT TROPONIN, ED: Troponin i, poc: 0 ng/mL (ref 0.00–0.08)

## 2014-09-30 ENCOUNTER — Emergency Department (HOSPITAL_COMMUNITY): Payer: Self-pay

## 2014-09-30 ENCOUNTER — Observation Stay (HOSPITAL_COMMUNITY)
Admission: EM | Admit: 2014-09-30 | Discharge: 2014-10-01 | Disposition: A | Discharge status: Home / Self Care | Payer: Self-pay | Attending: Internal Medicine | Admitting: Internal Medicine

## 2014-09-30 ENCOUNTER — Encounter (HOSPITAL_COMMUNITY): Payer: Self-pay | Admitting: Emergency Medicine

## 2014-09-30 DIAGNOSIS — F329 Major depressive disorder, single episode, unspecified: Secondary | ICD-10-CM

## 2014-09-30 DIAGNOSIS — Z886 Allergy status to analgesic agent status: Secondary | ICD-10-CM | POA: Insufficient documentation

## 2014-09-30 DIAGNOSIS — Z884 Allergy status to anesthetic agent status: Secondary | ICD-10-CM | POA: Insufficient documentation

## 2014-09-30 DIAGNOSIS — Z91040 Latex allergy status: Secondary | ICD-10-CM | POA: Insufficient documentation

## 2014-09-30 DIAGNOSIS — R197 Diarrhea, unspecified: Secondary | ICD-10-CM

## 2014-09-30 DIAGNOSIS — Z881 Allergy status to other antibiotic agents status: Secondary | ICD-10-CM | POA: Insufficient documentation

## 2014-09-30 DIAGNOSIS — Z7901 Long term (current) use of anticoagulants: Secondary | ICD-10-CM | POA: Insufficient documentation

## 2014-09-30 DIAGNOSIS — Z882 Allergy status to sulfonamides status: Secondary | ICD-10-CM | POA: Insufficient documentation

## 2014-09-30 DIAGNOSIS — F17210 Nicotine dependence, cigarettes, uncomplicated: Secondary | ICD-10-CM | POA: Insufficient documentation

## 2014-09-30 DIAGNOSIS — Z79899 Other long term (current) drug therapy: Secondary | ICD-10-CM | POA: Insufficient documentation

## 2014-09-30 DIAGNOSIS — F319 Bipolar disorder, unspecified: Secondary | ICD-10-CM

## 2014-09-30 DIAGNOSIS — F419 Anxiety disorder, unspecified: Secondary | ICD-10-CM | POA: Insufficient documentation

## 2014-09-30 DIAGNOSIS — Z888 Allergy status to other drugs, medicaments and biological substances status: Secondary | ICD-10-CM | POA: Insufficient documentation

## 2014-09-30 DIAGNOSIS — N809 Endometriosis, unspecified: Secondary | ICD-10-CM | POA: Insufficient documentation

## 2014-09-30 DIAGNOSIS — R630 Anorexia: Secondary | ICD-10-CM | POA: Insufficient documentation

## 2014-09-30 DIAGNOSIS — E876 Hypokalemia: Secondary | ICD-10-CM | POA: Diagnosis present

## 2014-09-30 DIAGNOSIS — R112 Nausea with vomiting, unspecified: Principal | ICD-10-CM

## 2014-09-30 DIAGNOSIS — Z452 Encounter for adjustment and management of vascular access device: Secondary | ICD-10-CM

## 2014-09-30 DIAGNOSIS — E860 Dehydration: Secondary | ICD-10-CM | POA: Insufficient documentation

## 2014-09-30 DIAGNOSIS — R109 Unspecified abdominal pain: Secondary | ICD-10-CM | POA: Insufficient documentation

## 2014-09-30 DIAGNOSIS — G43909 Migraine, unspecified, not intractable, without status migrainosus: Secondary | ICD-10-CM | POA: Insufficient documentation

## 2014-09-30 HISTORY — DX: Spondylosis without myelopathy or radiculopathy, cervical region: M47.812

## 2014-09-30 HISTORY — DX: Unspecified convulsions: R56.9

## 2014-09-30 HISTORY — DX: Encounter for adjustment and management of vascular access device: Z45.2

## 2014-09-30 LAB — COMPREHENSIVE METABOLIC PANEL
ALT: 13 U/L (ref 0–35)
AST: 20 U/L (ref 0–37)
Albumin: 5.1 g/dL (ref 3.5–5.2)
Alkaline Phosphatase: 90 U/L (ref 39–117)
Anion gap: 10 (ref 5–15)
BUN: 13 mg/dL (ref 6–23)
CO2: 23 mmol/L (ref 19–32)
Calcium: 9.9 mg/dL (ref 8.4–10.5)
Chloride: 101 mEq/L (ref 96–112)
Creatinine, Ser: 0.95 mg/dL (ref 0.50–1.10)
GFR calc Af Amer: 81 mL/min — ABNORMAL LOW (ref >90)
GFR calc non Af Amer: 70 mL/min — ABNORMAL LOW (ref >90)
GLUCOSE: 124 mg/dL — AB (ref 70–99)
Potassium: 2.8 mmol/L — ABNORMAL LOW (ref 3.5–5.1)
Sodium: 134 mmol/L — ABNORMAL LOW (ref 135–145)
TOTAL PROTEIN: 7.7 g/dL (ref 6.0–8.3)
Total Bilirubin: 0.7 mg/dL (ref 0.3–1.2)

## 2014-09-30 LAB — URINALYSIS, ROUTINE W REFLEX MICROSCOPIC
Bilirubin Urine: NEGATIVE
Glucose, UA: NEGATIVE mg/dL
Hgb urine dipstick: NEGATIVE
Ketones, ur: NEGATIVE mg/dL
LEUKOCYTES UA: NEGATIVE
Nitrite: NEGATIVE
Protein, ur: NEGATIVE mg/dL
Specific Gravity, Urine: 1.027 (ref 1.005–1.030)
UROBILINOGEN UA: 0.2 mg/dL (ref 0.0–1.0)
pH: 6 (ref 5.0–8.0)

## 2014-09-30 LAB — ACETAMINOPHEN LEVEL

## 2014-09-30 LAB — CBC WITH DIFFERENTIAL/PLATELET
BASOS ABS: 0 10*3/uL (ref 0.0–0.1)
Basophils Relative: 0 % (ref 0–1)
EOS ABS: 0.1 10*3/uL (ref 0.0–0.7)
Eosinophils Relative: 0 % (ref 0–5)
HEMATOCRIT: 45.4 % (ref 36.0–46.0)
HEMOGLOBIN: 16.6 g/dL — AB (ref 12.0–15.0)
LYMPHS PCT: 40 % (ref 12–46)
Lymphs Abs: 4.7 10*3/uL — ABNORMAL HIGH (ref 0.7–4.0)
MCH: 31.7 pg (ref 26.0–34.0)
MCHC: 36.6 g/dL — ABNORMAL HIGH (ref 30.0–36.0)
MCV: 86.6 fL (ref 78.0–100.0)
MONOS PCT: 7 % (ref 3–12)
Monocytes Absolute: 0.9 10*3/uL (ref 0.1–1.0)
NEUTROS ABS: 6.2 10*3/uL (ref 1.7–7.7)
Neutrophils Relative %: 52 % (ref 43–77)
Platelets: 411 10*3/uL — ABNORMAL HIGH (ref 150–400)
RBC: 5.24 MIL/uL — ABNORMAL HIGH (ref 3.87–5.11)
RDW: 12.8 % (ref 11.5–15.5)
WBC: 11.9 10*3/uL — ABNORMAL HIGH (ref 4.0–10.5)

## 2014-09-30 LAB — PREGNANCY, URINE: Preg Test, Ur: NEGATIVE

## 2014-09-30 LAB — LIPASE, BLOOD: LIPASE: 39 U/L (ref 11–59)

## 2014-09-30 MED ORDER — BUPIVACAINE HCL (PF) 0.5 % IJ SOLN
10.0000 mL | Freq: Once | INTRAMUSCULAR | Status: AC
  Administered 2014-09-30: 10 mL
  Filled 2014-09-30: qty 10

## 2014-09-30 MED ORDER — SODIUM CHLORIDE 0.9 % IJ SOLN: 3.0000 mL | Freq: Two times a day (BID) | INTRAMUSCULAR | Status: DC

## 2014-09-30 MED ORDER — ADULT MULTIVITAMIN W/MINERALS CH
1.0000 | ORAL_TABLET | Freq: Every day | ORAL | Status: DC
  Administered 2014-10-01: 1 via ORAL
  Filled 2014-09-30: qty 1

## 2014-09-30 MED ORDER — SODIUM CHLORIDE 0.9 % IV SOLN
INTRAVENOUS | Status: DC
  Administered 2014-10-01: 01:00:00 via INTRAVENOUS

## 2014-09-30 MED ORDER — LORAZEPAM 2 MG/ML IJ SOLN
1.0000 mg | Freq: Once | INTRAMUSCULAR | Status: AC
  Administered 2014-09-30: 1 mg via INTRAMUSCULAR

## 2014-09-30 MED ORDER — LEVETIRACETAM 500 MG PO TABS
500.0000 mg | ORAL_TABLET | Freq: Two times a day (BID) | ORAL | Status: DC
  Administered 2014-10-01 (×2): 500 mg via ORAL
  Filled 2014-09-30 (×3): qty 1

## 2014-09-30 MED ORDER — HYDROMORPHONE HCL 1 MG/ML IJ SOLN
1.0000 mg | Freq: Once | INTRAMUSCULAR | Status: AC
  Administered 2014-09-30: 1 mg via INTRAMUSCULAR
  Filled 2014-09-30: qty 1

## 2014-09-30 MED ORDER — ONDANSETRON HCL 4 MG/2ML IJ SOLN
4.0000 mg | Freq: Four times a day (QID) | INTRAMUSCULAR | Status: DC | PRN
  Administered 2014-10-01 (×2): 4 mg via INTRAVENOUS
  Filled 2014-09-30: qty 2

## 2014-09-30 MED ORDER — FOLIC ACID 1 MG PO TABS
1.0000 mg | ORAL_TABLET | Freq: Every day | ORAL | Status: DC
  Administered 2014-10-01: 1 mg via ORAL
  Filled 2014-09-30: qty 1

## 2014-09-30 MED ORDER — POTASSIUM CHLORIDE 10 MEQ/100ML IV SOLN
10.0000 meq | INTRAVENOUS | Status: AC
  Administered 2014-10-01 (×2): 10 meq via INTRAVENOUS
  Filled 2014-09-30 (×2): qty 100

## 2014-09-30 MED ORDER — ONDANSETRON HCL 4 MG/2ML IJ SOLN
4.0000 mg | Freq: Once | INTRAMUSCULAR | Status: AC
  Administered 2014-09-30: 4 mg via INTRAVENOUS
  Filled 2014-09-30: qty 2

## 2014-09-30 MED ORDER — POTASSIUM CHLORIDE CRYS ER 20 MEQ PO TBCR
40.0000 meq | EXTENDED_RELEASE_TABLET | Freq: Once | ORAL | Status: AC
  Administered 2014-09-30: 40 meq via ORAL
  Filled 2014-09-30: qty 2

## 2014-09-30 MED ORDER — ONDANSETRON HCL 4 MG PO TABS: 4.0000 mg | ORAL_TABLET | Freq: Four times a day (QID) | ORAL | Status: DC | PRN

## 2014-09-30 MED ORDER — VITAMIN B-1 100 MG PO TABS
100.0000 mg | ORAL_TABLET | Freq: Every day | ORAL | Status: DC
  Administered 2014-10-01: 100 mg via ORAL
  Filled 2014-09-30: qty 1

## 2014-09-30 MED ORDER — ONDANSETRON 4 MG PO TBDP
4.0000 mg | ORAL_TABLET | Freq: Once | ORAL | Status: AC
  Administered 2014-09-30: 4 mg via ORAL
  Filled 2014-09-30: qty 1

## 2014-09-30 MED ORDER — OXCARBAZEPINE 300 MG PO TABS
300.0000 mg | ORAL_TABLET | Freq: Two times a day (BID) | ORAL | Status: DC
  Administered 2014-10-01 (×2): 300 mg via ORAL
  Filled 2014-09-30 (×3): qty 1

## 2014-09-30 MED ORDER — LORAZEPAM 2 MG/ML IJ SOLN
1.0000 mg | Freq: Once | INTRAMUSCULAR | Status: DC
  Filled 2014-09-30: qty 1

## 2014-09-30 MED ORDER — CLONAZEPAM 0.5 MG PO TABS
1.0000 mg | ORAL_TABLET | Freq: Three times a day (TID) | ORAL | Status: DC | PRN
  Administered 2014-10-01 (×2): 1 mg via ORAL
  Filled 2014-09-30 (×2): qty 2

## 2014-09-30 MED ORDER — HEPARIN SODIUM (PORCINE) 5000 UNIT/ML IJ SOLN
5000.0000 [IU] | Freq: Three times a day (TID) | INTRAMUSCULAR | Status: DC
  Filled 2014-09-30 (×3): qty 1

## 2014-09-30 MED ORDER — SODIUM CHLORIDE 0.9 % IV BOLUS (SEPSIS)
1000.0000 mL | Freq: Once | INTRAVENOUS | Status: AC
  Administered 2014-09-30: 1000 mL via INTRAVENOUS

## 2014-09-30 MED ORDER — POTASSIUM CHLORIDE 10 MEQ/100ML IV SOLN
10.0000 meq | INTRAVENOUS | Status: AC
  Administered 2014-09-30: 10 meq via INTRAVENOUS
  Filled 2014-09-30: qty 100

## 2014-09-30 NOTE — ED Notes (Signed)
Report attempted 

## 2014-09-30 NOTE — ED Notes (Signed)
IV has infiltrated. Dr. Darl Householder informed.

## 2014-09-30 NOTE — H&P (Signed)
[facility] History and Physical  KAAVYA PUSKARICH QBV:[id number] DOB: 12-18-1965 DOA: 09/30/2014  Referring physician: Shirlyn Goltz, MD PCP: Imelda Pillow, NP   Chief Complaint: Nausea vomiting  HPI: [name] is a 49 y.o. female presents with nausea and vomiting. Patient states that she was admitted with a tylenol overdose in October. At that time she went into hepatic failure and had to be transferred to [facility]. Patient ended up recovering since then. Today she came in because she has been having nausea and vomiting. Patient states that she has chronic back pain and has been taking tylenol for this as she cannot take tramadol which caused seizures. She states that she has not been able to keep anything own. She has no blood noted. She states that there is abdominal pain which is cramping. She states that she has also had diarrhea. She states is has been dark and there is mucus noted. She denies sick exposures. She states that she has not traveled anywhere.   Review of Systems:  Constitutional:  No weight loss, night sweats, Fevers.  HEENT:  No headaches, Difficulty swallowing Cardio-vascular:  No chest pain, Orthopnea, PND, swelling in lower extremities, anasarca, ++dizziness GI:  No heartburn, indigestion, ++abdominal pain, ++nausea, ++vomiting, ++diarrhea, ++change in bowel habits, ++loss of appetite  Resp:  ++shortness of breath. No excess mucus, no productive cough, No coughing up of blood Skin:  no rash or lesions.  GU:  no dysuria, change in color of urine, no urgency or frequency Musculoskeletal:  No joint pain or swelling. ++back pain.  Psych:  No change in mood or affect. No depression or anxiety  Past Medical History  Diagnosis Date  . Migraines   . Endometriosis   . ETOH abuse     sober 3 1/2 years  . Anorexia   . Depression   . Anxiety    Past Surgical History  Procedure Laterality Date  . Knee surgery    . Abdominal surgery    . Cholecystectomy      . Appendectomy    . Abdominal hysterectomy    . Nasal sinus surgery    . Laproscopy    . Carpal tunnel release      2010   Social History:  reports that she has been smoking Cigarettes.  She has been smoking about 0.25 packs per day. She has never used smokeless tobacco. She reports that she does not drink alcohol or use illicit drugs.  Allergies  Allergen Reactions  . Aspirin Anaphylaxis  . Doxycycline Anaphylaxis  . Imitrex [Sumatriptan Base] Other (See Comments)    Severe hypertension  . Iodinated Diagnostic Agents Anaphylaxis  . Lidocaine Anaphylaxis    Pt states she does well with Marcaine/Bupivicaine without problem  . Prednisone Other (See Comments)    She goes crazy  . Sulfa Antibiotics Anaphylaxis  . Sulfasalazine Anaphylaxis  . Sumatriptan Other (See Comments)    Felt like she was "having a stroke", heart rate and blood pressure "went through the roof"  . Tramadol Other (See Comments)    seizures  . Levofloxacin Other (See Comments)    Joints hurt  . Latex Rash  . Levofloxacin Rash and Other (See Comments)    Joints hurt  . Metrizamide Other (See Comments)  . Nsaids Rash and Other (See Comments)    GI bleed  . Tolmetin Rash    Family History  Problem Relation Age of Onset  . Hypertension Mother   . Hyperlipidemia Mother   .  Heart failure Father   . Cancer Other      Prior to Admission medications   Medication Sig Start Date End Date Taking? Authorizing Provider  acetaminophen (TYLENOL) 325 MG tablet Take 650 mg by mouth every 6 (six) hours as needed for moderate pain.   Yes Historical Provider, MD  clonazePAM (KLONOPIN) 1 MG tablet Take 1 mg by mouth 3 (three) times daily as needed for anxiety.   Yes Historical Provider, MD  levETIRAcetam (KEPPRA) 500 MG tablet Take 500 mg by mouth 2 (two) times daily.   Yes Historical Provider, MD  Oxcarbazepine (TRILEPTAL) 300 MG tablet Take 300 mg by mouth 2 (two) times daily.   Yes Historical Provider, MD   acetylcysteine 40,000 mg in dextrose 5 % 800 mL Inject 925.5 mg/hr into the vein continuous. 07/14/14   Robbie Lis, MD  enoxaparin (LOVENOX) 40 MG/0.4ML injection Inject 0.4 mLs (40 mg total) into the skin daily. 07/14/14   Robbie Lis, MD  ondansetron (ZOFRAN) 4 MG tablet Take 1 tablet (4 mg total) by mouth every 6 (six) hours as needed for nausea. 07/14/14   Robbie Lis, MD   Physical Exam: Filed Vitals:   09/30/14 1628 09/30/14 1943  BP: 133/104 135/100  Pulse: 115 96  Temp: 98.7 F (37.1 C) 98.5 F (36.9 C)  TempSrc: Oral Oral  Resp: 17 17  Height: 5\' 1"  (1.549 m)   Weight: 54.885 kg (121 lb)   SpO2: 98% 98%    Wt Readings from Last 3 Encounters:  09/30/14 54.885 kg (121 lb)  07/15/14 63.368 kg (139 lb 11.2 oz)  03/20/14 61.78 kg (136 lb 3.2 oz)    General:  Appears calm and comfortable Eyes: PERRL, normal lids, irises & conjunctiva ENT: grossly normal hearing, lips & tongue Neck: no LAD, masses or thyromegaly Cardiovascular: RRR, no m/r/g. No LE edema. Respiratory: CTA bilaterally, no w/r/r. Normal respiratory effort. Abdomen: soft, mild upper abdomen tenderness noted Skin: no rash or induration seen on limited exam Musculoskeletal: grossly normal tone BUE/BLE Psychiatric: grossly normal mood and affect, speech fluent and appropriate Neurologic: grossly non-focal.          Labs on Admission:  Basic Metabolic Panel:  Recent Labs Lab 09/30/14 1649  NA 134*  K 2.8*  CL 101  CO2 23  GLUCOSE 124*  BUN 13  CREATININE 0.95  CALCIUM 9.9   Liver Function Tests:  Recent Labs Lab 09/30/14 1649  AST 20  ALT 13  ALKPHOS 90  BILITOT 0.7  PROT 7.7  ALBUMIN 5.1    Recent Labs Lab 09/30/14 1649  LIPASE 39   No results for input(s): AMMONIA in the last 168 hours. CBC:  Recent Labs Lab 09/30/14 1649  WBC 11.9*  NEUTROABS 6.2  HGB 16.6*  HCT 45.4  MCV 86.6  PLT 411*   Cardiac Enzymes: No results for input(s): CKTOTAL, CKMB, CKMBINDEX,  TROPONINI in the last 168 hours.  BNP (last 3 results) No results for input(s): PROBNP in the last 8760 hours. CBG: No results for input(s): GLUCAP in the last 168 hours.  Radiological Exams on Admission: Ct Abdomen Pelvis Wo Contrast  09/30/2014   CLINICAL DATA:  Abdominal pain.  Nausea, vomiting, diarrhea.  EXAM: CT ABDOMEN AND PELVIS WITHOUT CONTRAST  TECHNIQUE: Multidetector CT imaging of the abdomen and pelvis was performed following the standard protocol without IV contrast.  COMPARISON:  01/06/2010  FINDINGS: The included lung bases are clear.  Clips in the gallbladder fossa from cholecystectomy.  Mild prominence of the biliary tree, a common finding postcholecystectomy. This is unchanged from prior exam. There is focal fatty infiltration adjacent to the falciform ligament, the unenhanced liver is otherwise unremarkable. The unenhanced spleen, pancreas, and adrenal glands are normal. The kidneys are symmetric in size without stones or hydronephrosis. There is no perinephric stranding. Both ureters are decompressed without stones along the course.  Ingested oral contrast remains in the stomach. There is questionable gastric wall thickening. Small and large bowel loops are mildly fluid filled but without bowel wall thickening or dilatation. No perienteric inflammatory change. The appendix is normal. No free air, free fluid, or intra-abdominal fluid collection. Abdominal aorta is normal in caliber with mild atherosclerosis. No retroperitoneal adenopathy.  Within the pelvis, the urinary bladder is minimally distended. The uterus is not seen, presumably surgically absent. There is no adnexal mass. No pelvic free fluid.  No acute or suspicious osseous abnormality. There is a bone island in the left proximal femur.  IMPRESSION: 1. Questionable gastric wall thickening. Small and large bowel loops are fluid-filled but otherwise normal. Findings may reflect mild gastroenteritis. 2. Postcholecystectomy with  unchanged prominence of the common bile duct.   Electronically Signed   By: Jeb Levering M.D.   On: 09/30/2014 22:24     Assessment/Plan Active Problems:   Hypokalemia   Nausea and vomiting   Diarrhea   1. Hypokalemia -will replete with IV kcl now -repeat labs -Central Line placed in ED  2. Nausea and Vomiting -will treat symptomatically -will hydrate with IV NS  3. Diarrhea -CT suggests Gastroenteritis -hydrate and monitor -denies having been on antibiotics recently   Code Status: Full Code (must indicate code status--if unknown or must be presumed, indicate so) DVT Prophylaxis:Heparin Family Communication: None (indicate person spoken with, if applicable, with phone number if by telephone) Disposition Plan: Home (indicate anticipated LOS)  Time spent: 51min  KHAN,SAADAT A [facility] Pager [phone]

## 2014-09-30 NOTE — ED Notes (Signed)
Pt. Stated, Im really dizzy because I've had N/V/D for the last week.  Goes to [employer], PN

## 2014-09-30 NOTE — ED Notes (Signed)
Dr. Darl Householder states it is okay to use patient's central line.

## 2014-09-30 NOTE — ED Provider Notes (Signed)
CSN: [id number]     Arrival date & time 09/30/14  15 History   First MD Initiated Contact with Patient 09/30/14 1930     Chief Complaint  Patient presents with  . Nausea  . Morning Sickness  . Diarrhea  . Dizziness     (Consider location/radiation/quality/duration/timing/severity/associated sxs/prior Treatment) The history is provided by the patient.  MADYSUN THALL is a 49 y.o. female hx of endometriosis, recently admitted for tylenol overdose requiring transfer to [facility] here with persistent abdominal pain, vomiting, diarrhea. She was admitted in November and since then has been having persistent nausea and vomiting and diarrhea. She's been having about 2-3 episodes of diarrhea per day. Over the last several days she was unable to eat anything and was just vomiting up some mucus. Denies any chest pain or shortness of breath. She does have some diffuse abdominal pain as well.    Past Medical History  Diagnosis Date  . Migraines   . Endometriosis   . ETOH abuse     sober 3 1/2 years  . Anorexia   . Depression   . Anxiety    Past Surgical History  Procedure Laterality Date  . Knee surgery    . Abdominal surgery    . Cholecystectomy    . Appendectomy    . Abdominal hysterectomy    . Nasal sinus surgery    . Laproscopy    . Carpal tunnel release      2010   Family History  Problem Relation Age of Onset  . Hypertension Mother   . Hyperlipidemia Mother   . Heart failure Father   . Cancer Other    History  Substance Use Topics  . Smoking status: Current Every Day Smoker -- 0.25 packs/day    Types: Cigarettes  . Smokeless tobacco: Never Used  . Alcohol Use: No   OB History    No data available     Review of Systems  Gastrointestinal: Positive for nausea, vomiting, abdominal pain and diarrhea.  All other systems reviewed and are negative.     Allergies  Aspirin; Doxycycline; Imitrex; Iodinated diagnostic agents; Lidocaine; Prednisone; Sulfa antibiotics;  Sulfasalazine; Sumatriptan; Tramadol; Levofloxacin; Latex; Levofloxacin; Metrizamide; Nsaids; and Tolmetin  Home Medications   Prior to Admission medications   Medication Sig Start Date End Date Taking? Authorizing Provider  acetaminophen (TYLENOL) 325 MG tablet Take 650 mg by mouth every 6 (six) hours as needed for moderate pain.   Yes Historical Provider, MD  clonazePAM (KLONOPIN) 1 MG tablet Take 1 mg by mouth 3 (three) times daily as needed for anxiety.   Yes Historical Provider, MD  levETIRAcetam (KEPPRA) 500 MG tablet Take 500 mg by mouth 2 (two) times daily.   Yes Historical Provider, MD  Oxcarbazepine (TRILEPTAL) 300 MG tablet Take 300 mg by mouth 2 (two) times daily.   Yes Historical Provider, MD  acetylcysteine 40,000 mg in dextrose 5 % 800 mL Inject 925.5 mg/hr into the vein continuous. 07/14/14   Robbie Lis, MD  enoxaparin (LOVENOX) 40 MG/0.4ML injection Inject 0.4 mLs (40 mg total) into the skin daily. 07/14/14   Robbie Lis, MD  ondansetron (ZOFRAN) 4 MG tablet Take 1 tablet (4 mg total) by mouth every 6 (six) hours as needed for nausea. 07/14/14   Robbie Lis, MD   BP 135/100 mmHg  Pulse 96  Temp(Src) 98.5 F (36.9 C) (Oral)  Resp 17  Ht 5\' 1"  (1.549 m)  Wt 121 lb (54.885 kg)  BMI 22.87 kg/m2  SpO2 98% Physical Exam  Constitutional: She is oriented to person, place, and time.  Uncomfortable, dehydrated   HENT:  Head: Normocephalic.  MM dry   Eyes: Conjunctivae are normal. Pupils are equal, round, and reactive to light.  Neck: Normal range of motion. Neck supple.  Cardiovascular: Regular rhythm and normal heart sounds.   Tachy   Pulmonary/Chest: Effort normal and breath sounds normal. No respiratory distress. She has no wheezes. She has no rales.  Abdominal: Soft.  Mild diffuse tenderness, worse in periumbilical area, no rebound   Musculoskeletal: Normal range of motion. She exhibits no edema or tenderness.  Neurological: She is alert and oriented to person,  place, and time. No cranial nerve deficit. Coordination normal.  Skin: Skin is warm and dry.  Psychiatric: She has a normal mood and affect. Her behavior is normal. Judgment and thought content normal.  Nursing note and vitals reviewed.   ED Course  Procedures (including critical care time)  Angiocath insertion Performed by: Darl Householder, DAVID  Consent: Verbal consent obtained. Risks and benefits: risks, benefits and alternatives were discussed Time out: Immediately prior to procedure a "time out" was called to verify the correct patient, procedure, equipment, support staff and site/side marked as required.  Preparation: Patient was prepped and draped in the usual sterile fashion.  Vein Location: R axillary  Ultrasound Guided  Gauge: 20   Normal blood return and flush without difficulty Patient tolerance: Patient tolerated the procedure well with no immediate complications.  CENTRAL LINE Performed by: Darl Householder, DAVID Consent: The procedure was performed in an emergent situation. Required items: required blood products, implants, devices, and special equipment available Patient identity confirmed: arm band and provided demographic data Time out: Immediately prior to procedure a "time out" was called to verify the correct patient, procedure, equipment, support staff and site/side marked as required. Indications: vascular access Anesthesia: local infiltration Local anesthetic: marcaine Anesthetic total: 3 ml Patient sedated: no Preparation: skin prepped with 2% chlorhexidine Skin prep agent dried: skin prep agent completely dried prior to procedure Sterile barriers: all five maximum sterile barriers used - cap, mask, sterile gown, sterile gloves, and large sterile sheet Hand hygiene: hand hygiene performed prior to central venous catheter insertion  Location details: R IJ  Catheter type: triple lumen Catheter size: 8 Fr Pre-procedure: landmarks identified Ultrasound guidance:  Yes Successful placement: yes Post-procedure: line sutured and dressing applied Assessment: blood return through all parts, free fluid flow, placement verified by x-ray and no pneumothorax on x-ray Patient tolerance: Patient tolerated the procedure well with no immediate complications.      Labs Review Labs Reviewed  CBC WITH DIFFERENTIAL - Abnormal; Notable for the following:    WBC 11.9 (*)    RBC 5.24 (*)    Hemoglobin 16.6 (*)    MCHC 36.6 (*)    Platelets 411 (*)    Lymphs Abs 4.7 (*)    All other components within normal limits  COMPREHENSIVE METABOLIC PANEL - Abnormal; Notable for the following:    Sodium 134 (*)    Potassium 2.8 (*)    Glucose, Bld 124 (*)    GFR calc non Af Amer 70 (*)    GFR calc Af Amer 81 (*)    All other components within normal limits  URINALYSIS, ROUTINE W REFLEX MICROSCOPIC - Abnormal; Notable for the following:    APPearance HAZY (*)    All other components within normal limits  ACETAMINOPHEN LEVEL - Abnormal; Notable for the following:  Acetaminophen (Tylenol), Serum <10.0 (*)    All other components within normal limits  LIPASE, BLOOD  PREGNANCY, URINE    Imaging Review Ct Abdomen Pelvis Wo Contrast  09/30/2014   CLINICAL DATA:  Abdominal pain.  Nausea, vomiting, diarrhea.  EXAM: CT ABDOMEN AND PELVIS WITHOUT CONTRAST  TECHNIQUE: Multidetector CT imaging of the abdomen and pelvis was performed following the standard protocol without IV contrast.  COMPARISON:  01/06/2010  FINDINGS: The included lung bases are clear.  Clips in the gallbladder fossa from cholecystectomy. Mild prominence of the biliary tree, a common finding postcholecystectomy. This is unchanged from prior exam. There is focal fatty infiltration adjacent to the falciform ligament, the unenhanced liver is otherwise unremarkable. The unenhanced spleen, pancreas, and adrenal glands are normal. The kidneys are symmetric in size without stones or hydronephrosis. There is no  perinephric stranding. Both ureters are decompressed without stones along the course.  Ingested oral contrast remains in the stomach. There is questionable gastric wall thickening. Small and large bowel loops are mildly fluid filled but without bowel wall thickening or dilatation. No perienteric inflammatory change. The appendix is normal. No free air, free fluid, or intra-abdominal fluid collection. Abdominal aorta is normal in caliber with mild atherosclerosis. No retroperitoneal adenopathy.  Within the pelvis, the urinary bladder is minimally distended. The uterus is not seen, presumably surgically absent. There is no adnexal mass. No pelvic free fluid.  No acute or suspicious osseous abnormality. There is a bone island in the left proximal femur.  IMPRESSION: 1. Questionable gastric wall thickening. Small and large bowel loops are fluid-filled but otherwise normal. Findings may reflect mild gastroenteritis. 2. Postcholecystectomy with unchanged prominence of the common bile duct.   Electronically Signed   By: Jeb Levering M.D.   On: 09/30/2014 22:24     EKG Interpretation None      MDM   Final diagnoses:  Abdominal pain  Encounter for central line placement    ICELYN NAVARRETE is a 49 y.o. female here with abdominal pain. During last admission, she was transferred to [facility] for transplant evaluation but didn't require it. She is dehydrated. Consider persistent hepatitis vs colitis. Will check labs and CT and hydrate and likely need admission.   11:08 PM Attempted IV access. But it infiltrated. I tried R IJ but it went to R axillary vein. Can still use for now. Will admit for hypokalemia, dehydration. CT showed gastro. Hold off abx for now.     Wandra Arthurs, MD 09/30/14 [phone]

## 2014-10-01 ENCOUNTER — Encounter (HOSPITAL_COMMUNITY): Payer: Self-pay | Admitting: General Practice

## 2014-10-01 DIAGNOSIS — F319 Bipolar disorder, unspecified: Secondary | ICD-10-CM

## 2014-10-01 DIAGNOSIS — F329 Major depressive disorder, single episode, unspecified: Secondary | ICD-10-CM

## 2014-10-01 DIAGNOSIS — A084 Viral intestinal infection, unspecified: Secondary | ICD-10-CM

## 2014-10-01 DIAGNOSIS — F3160 Bipolar disorder, current episode mixed, unspecified: Secondary | ICD-10-CM

## 2014-10-01 LAB — CBC
HCT: 39.3 % (ref 36.0–46.0)
Hemoglobin: 13.9 g/dL (ref 12.0–15.0)
MCH: 32 pg (ref 26.0–34.0)
MCHC: 35.4 g/dL (ref 30.0–36.0)
MCV: 90.6 fL (ref 78.0–100.0)
PLATELETS: 352 10*3/uL (ref 150–400)
RBC: 4.34 MIL/uL (ref 3.87–5.11)
RDW: 13 % (ref 11.5–15.5)
WBC: 15.9 10*3/uL — ABNORMAL HIGH (ref 4.0–10.5)

## 2014-10-01 LAB — COMPREHENSIVE METABOLIC PANEL
ALBUMIN: 4.2 g/dL (ref 3.5–5.2)
ALT: 11 U/L (ref 0–35)
AST: 21 U/L (ref 0–37)
Alkaline Phosphatase: 69 U/L (ref 39–117)
Anion gap: 6 (ref 5–15)
BUN: 10 mg/dL (ref 6–23)
CO2: 26 mmol/L (ref 19–32)
CREATININE: 0.91 mg/dL (ref 0.50–1.10)
Calcium: 9.3 mg/dL (ref 8.4–10.5)
Chloride: 103 mEq/L (ref 96–112)
GFR calc Af Amer: 85 mL/min — ABNORMAL LOW (ref >90)
GFR calc non Af Amer: 73 mL/min — ABNORMAL LOW (ref >90)
Glucose, Bld: 97 mg/dL (ref 70–99)
Potassium: 3.6 mmol/L (ref 3.5–5.1)
Sodium: 135 mmol/L (ref 135–145)
TOTAL PROTEIN: 6.7 g/dL (ref 6.0–8.3)
Total Bilirubin: 0.6 mg/dL (ref 0.3–1.2)

## 2014-10-01 LAB — GLUCOSE, CAPILLARY: GLUCOSE-CAPILLARY: 138 mg/dL — AB (ref 70–99)

## 2014-10-01 LAB — TSH: TSH: 0.878 u[IU]/mL (ref 0.350–4.500)

## 2014-10-01 LAB — HEMOGLOBIN A1C
Hgb A1c MFr Bld: 5.5 % (ref <5.7)
Mean Plasma Glucose: 111 mg/dL (ref <117)

## 2014-10-01 MED ORDER — ENSURE COMPLETE PO LIQD
237.0000 mL | Freq: Two times a day (BID) | ORAL | Status: DC
  Administered 2014-10-01: 237 mL via ORAL

## 2014-10-01 MED ORDER — OXYCODONE HCL 5 MG PO TABS
10.0000 mg | ORAL_TABLET | Freq: Two times a day (BID) | ORAL | Status: DC | PRN
  Administered 2014-10-01: 10 mg via ORAL
  Filled 2014-10-01: qty 2

## 2014-10-01 MED ORDER — OXYCODONE HCL ER 10 MG PO T12A: 10.0000 mg | EXTENDED_RELEASE_TABLET | Freq: Two times a day (BID) | ORAL | Status: DC | PRN

## 2014-10-01 MED ORDER — CLONAZEPAM 1 MG PO TABS: 1.0000 mg | ORAL_TABLET | Freq: Three times a day (TID) | ORAL | Status: DC | PRN

## 2014-10-01 MED ORDER — OXYCODONE HCL 5 MG PO TABS
10.0000 mg | ORAL_TABLET | Freq: Four times a day (QID) | ORAL | Status: DC | PRN
  Administered 2014-10-01 (×2): 10 mg via ORAL
  Filled 2014-10-01 (×2): qty 2

## 2014-10-01 MED ORDER — SODIUM CHLORIDE 0.9 % IJ SOLN
10.0000 mL | INTRAMUSCULAR | Status: DC | PRN
Start: 2014-10-01 — End: 2014-10-01
  Administered 2014-10-01 (×3): 10 mL

## 2014-10-01 MED ORDER — OXCARBAZEPINE 300 MG PO TABS
300.0000 mg | ORAL_TABLET | Freq: Two times a day (BID) | ORAL | Status: DC
Start: 2014-10-01 — End: 2014-10-09

## 2014-10-01 MED ORDER — ENSURE COMPLETE PO LIQD: 237.0000 mL | Freq: Two times a day (BID) | ORAL | Status: DC

## 2014-10-01 NOTE — Care Management Note (Signed)
    Page 1 of 1   10/01/2014     4:05:48 PM CARE MANAGEMENT NOTE 10/01/2014  Patient:  [name], [name]   Account Number:  [id number]  Date Initiated:  10/01/2014  Documentation initiated by:  Tandi Hanko  Subjective/Objective Assessment:   Pt adm on 09/30/14 with N/V/D, hypokalemia.  PTA, pt independent of ADLS.     Action/Plan:   Pt for dc home today.  She needs PCP follow up and assistance with meds, as she has no insurance.   Anticipated DC Date:  10/01/2014   Anticipated DC Plan:  [place]  CM consult  Medication Assistance  [facility]  PCP issues      Choice offered to / List presented to:             Status of service:  Completed, signed off Medicare Important Message given?  NO (If response is "NO", the following Medicare IM given date fields will be blank) Date Medicare IM given:   Medicare IM given by:   Date Additional Medicare IM given:   Additional Medicare IM given by:    Discharge Disposition:  HOME/SELF CARE  Per UR Regulation:  Reviewed for med. necessity/level of care/duration of stay  If discussed at [place] of Stay Meetings, dates discussed:    Comments:  10/01/14 Ellan Lambert, RN, BSN [id number] Pt states she has no PCP currently, and is agreeable to follow up at [facility] and [facility]. Follow up appt made for 10/03/14 at 9:00am.  Appt information put on AVS.  Pt states she has no money for antidepressant meds, and is eligible for med assist through [facility] program.  [facility] letter given with explanation of program benefits.  She is appreciative of my help.

## 2014-10-01 NOTE — Discharge Summary (Signed)
Physician Discharge Summary  [name] BJS:[id number] DOB: 1966-08-29 DOA: 09/30/2014  PCP: Imelda Pillow, NP  Admit date: 09/30/2014 Discharge date: 10/01/2014  Time spent: 45 minutes  Recommendations for Outpatient Follow-up:  1. Patient having difficulties affording medications, case manager was consulted to assist her with medication assistance, please follow to ensure she has access to her medicines  Discharge Diagnoses:  Active Problems:   Bipolar disorder   Major depression   Hypokalemia   Nausea and vomiting   Diarrhea   Discharge Condition: Stable  Diet recommendation:  Normal  Filed Weights   09/30/14 1628 10/01/14 0001  Weight: 54.885 kg (121 lb) 56.836 kg (125 lb 4.8 oz)    History of present illness:   [name] is a 49 y.o. female presents with nausea and vomiting. Patient states that she was admitted with a tylenol overdose in October. At that time she went into hepatic failure and had to be transferred to [facility]. Patient ended up recovering since then. Today she came in because she has been having nausea and vomiting. Patient states that she has chronic back pain and has been taking tylenol for this as she cannot take tramadol which caused seizures. She states that she has not been able to keep anything own. She has no blood noted. She states that there is abdominal pain which is cramping. She states that she has also had diarrhea. She states is has been dark and there is mucus noted. She denies sick exposures. She states that she has not traveled anywhere.   Hospital Course:   [name] is a 49 y.o. female presents with nausea and vomiting. Patient states that she was admitted with a tylenol overdose in 10/15. At that time she went into hepatic failure and had to be transferred to [facility] to meet with a transplant team and to start acetylcystein.  She did not end up needing a liver transplant, but states her stomach problems began then. Patient states that  she has chronic back pain and got fed up with it in October, which is why she attempted suicide.  When asked if she had any thoughts of self harm, she said the thought had crossed her mind but immediately went away and that she said she would never put her family through another suicide attempt again.   She has a history of depression, bipolar disorder, anxiety, alcohol abuse (sober for 3.5 years), anorexia and bolemia, endometriosis, and seizures.  She states that she has not eaten in 7 days because of her stomach pain, which she describes as feeling similar to how it feels when having a panic attack. She states that there is abdominal pain which is cramping. She had only urinated once by 1/12 am after IV and po fluids.  Abdominal CT showed possible gastroenteritis.  She came in with a potassium of 2.8 and has a K of 3.6 on 1/12 after IV  Potassium chloride.  Anxiety, Depression, and Bipolar Disorder History of anxiety, depression, and bipolar disorder Initiated psych referral for after discharge Continued clonazepam for anxiety and Trileptal for bipolar disorder with counseling to take as directed   N/V/D Likely secondary to food anxiety and other psychologic issues Abdominal CT scan shows possible gastroenteritis Inpatient Zofran addressed the nausea but did not help the anxiety-like cramping of her stomach Addressing psychological issues with psych referral   Hypokalemia Likely due to poor nutrition, anorexia, and bulimia Given  Potassium chloride tablet (40 mEq) and potassium chloride 10  mEq 1/11 K on 1/12 3.6  Procedures:  None    Consultations:  None  Discharge Exam: Filed Vitals:   10/01/14 0425  BP: 131/80  Pulse: 97  Temp: 97.8 F (36.6 C)  Resp: 18    General: Thin, NAD, laying calmly in bed  Cardiovascular: RRR, S1 S2 auscultated, no rubs, murmurs or gallops.   Respiratory: Clear to auscultation bilaterally with equal chest rise  Abdomen: Soft, tender to palpation  in midepigastric, LUQ, and LLQ, nondistended, + bowel sounds  Extremities: warm dry without cyanosis clubbing or edema.  Neuro: AAOx3. Strength 5/5 in lower extremities  Skin: Without rashes exudates or nodules.   Psych: Normal affect and demeanor with intact judgement and insight Discharge Instructions   Discharge Instructions    Call MD for:  difficulty breathing, headache or visual disturbances    Complete by:  As directed      Call MD for:  extreme fatigue    Complete by:  As directed      Call MD for:  hives    Complete by:  As directed      Call MD for:  persistant dizziness or light-headedness    Complete by:  As directed      Call MD for:  persistant nausea and vomiting    Complete by:  As directed      Call MD for:  redness, tenderness, or signs of infection (pain, swelling, redness, odor or green/yellow discharge around incision site)    Complete by:  As directed      Call MD for:  severe uncontrolled pain    Complete by:  As directed      Call MD for:  temperature >100.4    Complete by:  As directed      Diet - low sodium heart healthy    Complete by:  As directed      Increase activity slowly    Complete by:  As directed           Current Discharge Medication List    START taking these medications   Details  feeding supplement, ENSURE COMPLETE, (ENSURE COMPLETE) LIQD Take 237 mLs by mouth 2 (two) times daily between meals. Qty: 20 Bottle, Refills: 2      CONTINUE these medications which have CHANGED   Details  clonazePAM (KLONOPIN) 1 MG tablet Take 1 tablet (1 mg total) by mouth 3 (three) times daily as needed for anxiety. Qty: 20 tablet, Refills: 0    Oxcarbazepine (TRILEPTAL) 300 MG tablet Take 1 tablet (300 mg total) by mouth 2 (two) times daily. Qty: 60 tablet, Refills: 3      CONTINUE these medications which have NOT CHANGED   Details  levETIRAcetam (KEPPRA) 500 MG tablet Take 500 mg by mouth 2 (two) times daily.      STOP taking these  medications     acetaminophen (TYLENOL) 325 MG tablet      acetylcysteine 40,000 mg in dextrose 5 % 800 mL      enoxaparin (LOVENOX) 40 MG/0.4ML injection      ondansetron (ZOFRAN) 4 MG tablet        Allergies  Allergen Reactions  . Aspirin Anaphylaxis  . Doxycycline Anaphylaxis  . Imitrex [Sumatriptan Base] Other (See Comments)    Severe hypertension  . Iodinated Diagnostic Agents Anaphylaxis  . Lidocaine Anaphylaxis    Pt states she does well with Marcaine/Bupivicaine without problem  . Prednisone Other (See Comments)    She goes crazy  .  Sulfa Antibiotics Anaphylaxis  . Sulfasalazine Anaphylaxis  . Sumatriptan Other (See Comments)    Felt like she was "having a stroke", heart rate and blood pressure "went through the roof"  . Tramadol Other (See Comments)    seizures  . Levofloxacin Other (See Comments)    Joints hurt  . Latex Rash  . Levofloxacin Rash and Other (See Comments)    Joints hurt  . Metrizamide Other (See Comments)  . Nsaids Rash and Other (See Comments)    GI bleed  . Tolmetin Rash   Follow-up Information    Follow up with [facility]     On 10/03/2014.   Why:  9:00 am; please bring photo ID, dc instructions, all meds you are taking, and $20 copay if you are able.     Contact information:   [address] [place] [address] [phone]       The results of significant diagnostics from this hospitalization (including imaging, microbiology, ancillary and laboratory) are listed below for reference.    Significant Diagnostic Studies: Ct Abdomen Pelvis Wo Contrast  09/30/2014   CLINICAL DATA:  Abdominal pain.  Nausea, vomiting, diarrhea.  EXAM: CT ABDOMEN AND PELVIS WITHOUT CONTRAST  TECHNIQUE: Multidetector CT imaging of the abdomen and pelvis was performed following the standard protocol without IV contrast.  COMPARISON:  01/06/2010  FINDINGS: The included lung bases are clear.  Clips in the  gallbladder fossa from cholecystectomy. Mild prominence of the biliary tree, a common finding postcholecystectomy. This is unchanged from prior exam. There is focal fatty infiltration adjacent to the falciform ligament, the unenhanced liver is otherwise unremarkable. The unenhanced spleen, pancreas, and adrenal glands are normal. The kidneys are symmetric in size without stones or hydronephrosis. There is no perinephric stranding. Both ureters are decompressed without stones along the course.  Ingested oral contrast remains in the stomach. There is questionable gastric wall thickening. Small and large bowel loops are mildly fluid filled but without bowel wall thickening or dilatation. No perienteric inflammatory change. The appendix is normal. No free air, free fluid, or intra-abdominal fluid collection. Abdominal aorta is normal in caliber with mild atherosclerosis. No retroperitoneal adenopathy.  Within the pelvis, the urinary bladder is minimally distended. The uterus is not seen, presumably surgically absent. There is no adnexal mass. No pelvic free fluid.  No acute or suspicious osseous abnormality. There is a bone island in the left proximal femur.  IMPRESSION: 1. Questionable gastric wall thickening. Small and large bowel loops are fluid-filled but otherwise normal. Findings may reflect mild gastroenteritis. 2. Postcholecystectomy with unchanged prominence of the common bile duct.   Electronically Signed   By: Jeb Levering M.D.   On: 09/30/2014 22:24   Dg Chest Portable 1 View  09/30/2014   CLINICAL DATA:  Central line placement.  Initial encounter.  EXAM: PORTABLE CHEST - 1 VIEW  COMPARISON:  Chest radiograph performed 07/13/2014  FINDINGS: The patient's right IJ line is seen extending into the right axillary vein. On discussion with the clinical team, repositioning may be too difficult at this time, given the patient's difficulties with vascular access.  The lungs are well-aerated and clear. There is  no evidence of focal opacification, pleural effusion or pneumothorax.  The cardiomediastinal silhouette is within normal limits. No acute osseous abnormalities are seen.  IMPRESSION: 1. Left IJ line noted extending into the right axillary vein. On discussion with the clinical team, repositioning may be too difficult at this time, given  the patient's difficulties with vascular access. 2. No acute cardiopulmonary process seen.  These results were called by telephone at the time of interpretation on 09/30/2014 at 11:10 pm to Dr. Shirlyn Goltz, who verbally acknowledged these results.   Electronically Signed   By: Garald Balding M.D.   On: 09/30/2014 23:10    Microbiology: No results found for this or any previous visit (from the past 240 hour(s)).   Labs: Basic Metabolic Panel:  Recent Labs Lab 09/30/14 1649 10/01/14 0521  NA 134* 135  K 2.8* 3.6  CL 101 103  CO2 23 26  GLUCOSE 124* 97  BUN 13 10  CREATININE 0.95 0.91  CALCIUM 9.9 9.3   Liver Function Tests:  Recent Labs Lab 09/30/14 1649 10/01/14 0521  AST 20 21  ALT 13 11  ALKPHOS 90 69  BILITOT 0.7 0.6  PROT 7.7 6.7  ALBUMIN 5.1 4.2    Recent Labs Lab 09/30/14 1649  LIPASE 39   No results for input(s): AMMONIA in the last 168 hours. CBC:  Recent Labs Lab 09/30/14 1649 10/01/14 0521  WBC 11.9* 15.9*  NEUTROABS 6.2  --   HGB 16.6* 13.9  HCT 45.4 39.3  MCV 86.6 90.6  PLT 411* 352   Cardiac Enzymes: No results for input(s): CKTOTAL, CKMB, CKMBINDEX, TROPONINI in the last 168 hours. BNP: BNP (last 3 results) No results for input(s): PROBNP in the last 8760 hours. CBG:  Recent Labs Lab 10/01/14 0556  GLUCAP 138*       Signed:  Kelvin Cellar  [facility] 10/01/2014, 1:33 PM  Addendum  I evaluated patient on 10/01/2014 and agree with the above findings. Patient is a 49 year old female with a past medical history of bipolar disorder, major depression, who was admitted to the medicine service  on 09/30/2014. She presented with complaints of increased anxiety, nausea vomiting and diarrhea. Labs revealed depressed hypokalemia having a potassium 2.8. She'll also have a leukocytosis with white count of 11,900. Suspected that symptoms were related to infections viral gastroenteritis. Patient was placed in 24 observation, given IV fluids and potassium supplementation. With regard to mood disorder she was restarted on Trileptal along with clonazepam. She reported clinical improvement by the following day. Case manager was consulted to assist with setting her up with local PCP for hospital follow-up along with prescription assistance for psych meds. She was discharged to her home in stable condition on 10/01/2014.

## 2014-10-01 NOTE — Progress Notes (Signed)
UR completed 

## 2014-10-05 ENCOUNTER — Encounter (HOSPITAL_COMMUNITY): Payer: Self-pay | Admitting: *Deleted

## 2014-10-05 ENCOUNTER — Inpatient Hospital Stay (HOSPITAL_COMMUNITY): Payer: Self-pay

## 2014-10-05 ENCOUNTER — Inpatient Hospital Stay (HOSPITAL_COMMUNITY)
Admission: EM | Admit: 2014-10-05 | Discharge: 2014-10-09 | DRG: 378 | Disposition: A | Discharge status: Other Acute Inpatient Hospital | Payer: Self-pay | Attending: Internal Medicine | Admitting: Internal Medicine

## 2014-10-05 ENCOUNTER — Emergency Department (HOSPITAL_COMMUNITY): Payer: Self-pay

## 2014-10-05 DIAGNOSIS — F509 Eating disorder, unspecified: Secondary | ICD-10-CM | POA: Diagnosis present

## 2014-10-05 DIAGNOSIS — N289 Disorder of kidney and ureter, unspecified: Secondary | ICD-10-CM

## 2014-10-05 DIAGNOSIS — Z609 Problem related to social environment, unspecified: Secondary | ICD-10-CM | POA: Diagnosis present

## 2014-10-05 DIAGNOSIS — W19XXXA Unspecified fall, initial encounter: Secondary | ICD-10-CM

## 2014-10-05 DIAGNOSIS — R569 Unspecified convulsions: Secondary | ICD-10-CM | POA: Diagnosis present

## 2014-10-05 DIAGNOSIS — K7200 Acute and subacute hepatic failure without coma: Secondary | ICD-10-CM | POA: Insufficient documentation

## 2014-10-05 DIAGNOSIS — K226 Gastro-esophageal laceration-hemorrhage syndrome: Secondary | ICD-10-CM | POA: Diagnosis present

## 2014-10-05 DIAGNOSIS — F17210 Nicotine dependence, cigarettes, uncomplicated: Secondary | ICD-10-CM | POA: Diagnosis present

## 2014-10-05 DIAGNOSIS — D62 Acute posthemorrhagic anemia: Secondary | ICD-10-CM | POA: Diagnosis present

## 2014-10-05 DIAGNOSIS — M479 Spondylosis, unspecified: Secondary | ICD-10-CM | POA: Diagnosis present

## 2014-10-05 DIAGNOSIS — E876 Hypokalemia: Secondary | ICD-10-CM | POA: Diagnosis present

## 2014-10-05 DIAGNOSIS — R45851 Suicidal ideations: Secondary | ICD-10-CM

## 2014-10-05 DIAGNOSIS — K260 Acute duodenal ulcer with hemorrhage: Principal | ICD-10-CM | POA: Diagnosis present

## 2014-10-05 DIAGNOSIS — K264 Chronic or unspecified duodenal ulcer with hemorrhage: Secondary | ICD-10-CM | POA: Diagnosis present

## 2014-10-05 DIAGNOSIS — B179 Acute viral hepatitis, unspecified: Secondary | ICD-10-CM | POA: Diagnosis present

## 2014-10-05 DIAGNOSIS — Z8659 Personal history of other mental and behavioral disorders: Secondary | ICD-10-CM

## 2014-10-05 DIAGNOSIS — S36118A Other injury of liver, initial encounter: Secondary | ICD-10-CM | POA: Diagnosis present

## 2014-10-05 DIAGNOSIS — T391X2A Poisoning by 4-Aminophenol derivatives, intentional self-harm, initial encounter: Secondary | ICD-10-CM | POA: Diagnosis present

## 2014-10-05 DIAGNOSIS — F319 Bipolar disorder, unspecified: Secondary | ICD-10-CM | POA: Diagnosis present

## 2014-10-05 DIAGNOSIS — G40909 Epilepsy, unspecified, not intractable, without status epilepticus: Secondary | ICD-10-CM

## 2014-10-05 DIAGNOSIS — E860 Dehydration: Secondary | ICD-10-CM | POA: Diagnosis present

## 2014-10-05 DIAGNOSIS — F332 Major depressive disorder, recurrent severe without psychotic features: Secondary | ICD-10-CM | POA: Diagnosis present

## 2014-10-05 DIAGNOSIS — T391X1A Poisoning by 4-Aminophenol derivatives, accidental (unintentional), initial encounter: Secondary | ICD-10-CM | POA: Diagnosis present

## 2014-10-05 DIAGNOSIS — R1110 Vomiting, unspecified: Secondary | ICD-10-CM | POA: Diagnosis present

## 2014-10-05 DIAGNOSIS — K922 Gastrointestinal hemorrhage, unspecified: Secondary | ICD-10-CM | POA: Diagnosis present

## 2014-10-05 DIAGNOSIS — F22 Delusional disorders: Secondary | ICD-10-CM | POA: Diagnosis present

## 2014-10-05 DIAGNOSIS — Z599 Problem related to housing and economic circumstances, unspecified: Secondary | ICD-10-CM

## 2014-10-05 DIAGNOSIS — Y92009 Unspecified place in unspecified non-institutional (private) residence as the place of occurrence of the external cause: Secondary | ICD-10-CM

## 2014-10-05 DIAGNOSIS — E7220 Disorder of urea cycle metabolism, unspecified: Secondary | ICD-10-CM | POA: Diagnosis present

## 2014-10-05 LAB — COMPREHENSIVE METABOLIC PANEL
ALBUMIN: 4.6 g/dL (ref 3.5–5.2)
ALT: 4112 U/L — AB (ref 0–35)
AST: 2286 U/L — ABNORMAL HIGH (ref 0–37)
Alkaline Phosphatase: 137 U/L — ABNORMAL HIGH (ref 39–117)
Anion gap: 18 — ABNORMAL HIGH (ref 5–15)
BUN: 23 mg/dL (ref 6–23)
CALCIUM: 9.8 mg/dL (ref 8.4–10.5)
CHLORIDE: 105 meq/L (ref 96–112)
CO2: 16 mmol/L — AB (ref 19–32)
Creatinine, Ser: 1.47 mg/dL — ABNORMAL HIGH (ref 0.50–1.10)
GFR calc Af Amer: 48 mL/min — ABNORMAL LOW (ref >90)
GFR calc non Af Amer: 41 mL/min — ABNORMAL LOW (ref >90)
GLUCOSE: 90 mg/dL (ref 70–99)
POTASSIUM: 3.2 mmol/L — AB (ref 3.5–5.1)
Sodium: 139 mmol/L (ref 135–145)
Total Bilirubin: 1.6 mg/dL — ABNORMAL HIGH (ref 0.3–1.2)
Total Protein: 7.1 g/dL (ref 6.0–8.3)

## 2014-10-05 LAB — I-STAT VENOUS BLOOD GAS, ED
Acid-base deficit: 8 mmol/L — ABNORMAL HIGH (ref 0.0–2.0)
Bicarbonate: 16 mEq/L — ABNORMAL LOW (ref 20.0–24.0)
O2 Saturation: 94 %
TCO2: 17 mmol/L (ref 0–100)
pCO2, Ven: 28.1 mmHg — ABNORMAL LOW (ref 45.0–50.0)
pH, Ven: 7.364 — ABNORMAL HIGH (ref 7.250–7.300)
pO2, Ven: 72 mmHg — ABNORMAL HIGH (ref 30.0–45.0)

## 2014-10-05 LAB — URINALYSIS, ROUTINE W REFLEX MICROSCOPIC
GLUCOSE, UA: NEGATIVE mg/dL
HGB URINE DIPSTICK: NEGATIVE
Ketones, ur: 15 mg/dL — AB
Leukocytes, UA: NEGATIVE
Nitrite: NEGATIVE
Protein, ur: NEGATIVE mg/dL
SPECIFIC GRAVITY, URINE: 1.031 — AB (ref 1.005–1.030)
Urobilinogen, UA: 1 mg/dL (ref 0.0–1.0)
pH: 6 (ref 5.0–8.0)

## 2014-10-05 LAB — CBC WITH DIFFERENTIAL/PLATELET
Basophils Absolute: 0 10*3/uL (ref 0.0–0.1)
Basophils Relative: 0 % (ref 0–1)
EOS ABS: 0 10*3/uL (ref 0.0–0.7)
EOS PCT: 0 % (ref 0–5)
HCT: 41.3 % (ref 36.0–46.0)
Hemoglobin: 14.7 g/dL (ref 12.0–15.0)
LYMPHS ABS: 2.3 10*3/uL (ref 0.7–4.0)
LYMPHS PCT: 15 % (ref 12–46)
MCH: 31.3 pg (ref 26.0–34.0)
MCHC: 35.6 g/dL (ref 30.0–36.0)
MCV: 87.9 fL (ref 78.0–100.0)
MONO ABS: 0.2 10*3/uL (ref 0.1–1.0)
Monocytes Relative: 1 % — ABNORMAL LOW (ref 3–12)
NEUTROS ABS: 12.5 10*3/uL — AB (ref 1.7–7.7)
NEUTROS PCT: 84 % — AB (ref 43–77)
Platelets: 285 10*3/uL (ref 150–400)
RBC: 4.7 MIL/uL (ref 3.87–5.11)
RDW: 14.3 % (ref 11.5–15.5)
WBC: 15 10*3/uL — AB (ref 4.0–10.5)

## 2014-10-05 LAB — ETHANOL

## 2014-10-05 LAB — RAPID URINE DRUG SCREEN, HOSP PERFORMED
Amphetamines: NOT DETECTED
BENZODIAZEPINES: POSITIVE — AB
Barbiturates: NOT DETECTED
Cocaine: NOT DETECTED
Opiates: NOT DETECTED
TETRAHYDROCANNABINOL: NOT DETECTED

## 2014-10-05 LAB — LIPASE, BLOOD: Lipase: 90 U/L — ABNORMAL HIGH (ref 11–59)

## 2014-10-05 LAB — ACETAMINOPHEN LEVEL
ACETAMINOPHEN (TYLENOL), SERUM: 61 ug/mL — AB (ref 10–30)
Acetaminophen (Tylenol), Serum: 27 ug/mL (ref 10–30)

## 2014-10-05 LAB — TYPE AND SCREEN
ABO/RH(D): O POS
Antibody Screen: NEGATIVE

## 2014-10-05 LAB — PREGNANCY, URINE: PREG TEST UR: NEGATIVE

## 2014-10-05 LAB — I-STAT CG4 LACTIC ACID, ED: Lactic Acid, Venous: 1 mmol/L (ref 0.5–2.2)

## 2014-10-05 LAB — MRSA PCR SCREENING: MRSA by PCR: NEGATIVE

## 2014-10-05 LAB — MAGNESIUM: Magnesium: 2.5 mg/dL (ref 1.5–2.5)

## 2014-10-05 LAB — PROTIME-INR
INR: 1.45 (ref 0.00–1.49)
Prothrombin Time: 17.8 seconds — ABNORMAL HIGH (ref 11.6–15.2)

## 2014-10-05 LAB — AMMONIA: AMMONIA: 78 umol/L — AB (ref 11–32)

## 2014-10-05 LAB — SALICYLATE LEVEL

## 2014-10-05 LAB — ABO/RH: ABO/RH(D): O POS

## 2014-10-05 MED ORDER — ACETYLCYSTEINE LOAD VIA INFUSION
150.0000 mg/kg | Freq: Once | INTRAVENOUS | Status: AC
  Administered 2014-10-05: 8505 mg via INTRAVENOUS
  Filled 2014-10-05: qty 213

## 2014-10-05 MED ORDER — SODIUM CHLORIDE 0.9 % IJ SOLN
10.0000 mL | Freq: Two times a day (BID) | INTRAMUSCULAR | Status: DC
  Administered 2014-10-05 – 2014-10-08 (×5): 10 mL

## 2014-10-05 MED ORDER — SODIUM CHLORIDE 0.9 % IV BOLUS (SEPSIS)
1000.0000 mL | Freq: Once | INTRAVENOUS | Status: AC
  Administered 2014-10-05: 1000 mL via INTRAVENOUS

## 2014-10-05 MED ORDER — LEVETIRACETAM IN NACL 500 MG/100ML IV SOLN
500.0000 mg | Freq: Two times a day (BID) | INTRAVENOUS | Status: DC
  Administered 2014-10-05 – 2014-10-07 (×4): 500 mg via INTRAVENOUS
  Filled 2014-10-05 (×6): qty 100

## 2014-10-05 MED ORDER — SODIUM CHLORIDE 0.9 % IJ SOLN
10.0000 mL | INTRAMUSCULAR | Status: DC | PRN
  Administered 2014-10-08 – 2014-10-09 (×3): 10 mL
  Filled 2014-10-05 (×3): qty 40

## 2014-10-05 MED ORDER — SODIUM CHLORIDE 0.9 % IV SOLN
8.0000 mg/h | INTRAVENOUS | Status: DC
  Administered 2014-10-05 – 2014-10-07 (×3): 8 mg/h via INTRAVENOUS
  Filled 2014-10-05 (×10): qty 80

## 2014-10-05 MED ORDER — BUPIVACAINE HCL 0.5 % IJ SOLN
50.0000 mL | Freq: Once | INTRAMUSCULAR | Status: AC
  Administered 2014-10-05: 10 mL
  Filled 2014-10-05: qty 50

## 2014-10-05 MED ORDER — ONDANSETRON HCL 4 MG PO TABS: 4.0000 mg | ORAL_TABLET | Freq: Four times a day (QID) | ORAL | Status: DC | PRN

## 2014-10-05 MED ORDER — ONDANSETRON HCL 4 MG/2ML IJ SOLN
4.0000 mg | Freq: Four times a day (QID) | INTRAMUSCULAR | Status: DC | PRN
  Administered 2014-10-06: 4 mg via INTRAVENOUS
  Filled 2014-10-05: qty 2

## 2014-10-05 MED ORDER — DEXTROSE 5 % IV SOLN
15.0000 mg/kg/h | INTRAVENOUS | Status: DC
  Administered 2014-10-05: 15 mg/kg/h via INTRAVENOUS
  Filled 2014-10-05 (×4): qty 150

## 2014-10-05 MED ORDER — PANTOPRAZOLE SODIUM 40 MG IV SOLR
80.0000 mg | Freq: Once | INTRAVENOUS | Status: AC
  Administered 2014-10-05: 80 mg via INTRAVENOUS
  Filled 2014-10-05: qty 80

## 2014-10-05 MED ORDER — POTASSIUM CHLORIDE 10 MEQ/100ML IV SOLN
10.0000 meq | INTRAVENOUS | Status: AC
  Administered 2014-10-05 (×3): 10 meq via INTRAVENOUS
  Filled 2014-10-05 (×3): qty 100

## 2014-10-05 MED ORDER — POTASSIUM CHLORIDE IN NACL 40-0.9 MEQ/L-% IV SOLN
INTRAVENOUS | Status: DC
  Administered 2014-10-05 – 2014-10-07 (×4): 100 mL/h via INTRAVENOUS
  Filled 2014-10-05 (×6): qty 1000

## 2014-10-05 MED ORDER — FENTANYL CITRATE 0.05 MG/ML IJ SOLN
50.0000 ug | Freq: Once | INTRAMUSCULAR | Status: AC
Start: 2014-10-05 — End: 2014-10-05
  Administered 2014-10-05: 50 ug via INTRAVENOUS
  Filled 2014-10-05: qty 2

## 2014-10-05 MED ORDER — ONDANSETRON 4 MG PO TBDP
4.0000 mg | ORAL_TABLET | Freq: Once | ORAL | Status: AC
  Administered 2014-10-05: 4 mg via ORAL
  Filled 2014-10-05: qty 1

## 2014-10-05 NOTE — ED Notes (Signed)
Pharmacy called and informed of need for acetylcysteine, they stated they will send it.

## 2014-10-05 NOTE — ED Notes (Signed)
Attempted report 

## 2014-10-05 NOTE — ED Notes (Signed)
House sitter relieved me from sitting; now he is at bedside with pt

## 2014-10-05 NOTE — Progress Notes (Addendum)
Pt came from ED to room 2C08. Pt is alert and oriented to self, place and situation. Pt given CHG bath and swabbed nares for MRSA and sent to lab. Pt hooked up to central telemetry and secretary called to notify telemetry. Giving report to night nurse. Nurse from ED who gave report stated patient had been on suicidal precautions initially but no longer was. Pt also states she had a fall at home and fell down steps and her shoulder blade on right side hurts to touch when nurse examined patient and did CHG bath. Pt stated she did not report this to doctor in ED. Will pass on to night nurse.

## 2014-10-05 NOTE — Progress Notes (Signed)
Peripherally Inserted Central Catheter/Midline Placement  The IV Nurse has discussed with the patient and/or persons authorized to consent for the patient, the purpose of this procedure and the potential benefits and risks involved with this procedure.  The benefits include less needle sticks, lab draws from the catheter and patient may be discharged home with the catheter.  Risks include, but not limited to, infection, bleeding, blood clot (thrombus formation), and puncture of an artery; nerve damage and irregular heat beat.  Alternatives to this procedure were also discussed.  PICC/Midline Placement Documentation  PICC / Midline Double Lumen 29/52/84 PICC Right Basilic 35 cm 0 cm (Active)  Indication for Insertion or Continuance of Line Poor Vasculature-patient has had multiple peripheral attempts or PIVs lasting less than 24 hours 10/05/2014 12:18 PM  Exposed Catheter (cm) 0 cm 10/05/2014 12:18 PM  Site Assessment Clean;Dry;Intact 10/05/2014 12:18 PM  Lumen #1 Status Flushed;Saline locked;Blood return noted 10/05/2014 12:18 PM  Lumen #2 Status Flushed;Saline locked;Blood return noted 10/05/2014 12:18 PM  Dressing Change Due 10/12/14 10/05/2014 12:18 PM       Gordan Payment 10/05/2014, 12:20 PM

## 2014-10-05 NOTE — ED Provider Notes (Signed)
CSN: [id number]     Arrival date & time 10/05/14  0600 History   First MD Initiated Contact with Patient 10/05/14 [phone]     Chief Complaint  Patient presents with  . Hematemesis  . Melena  . Suicidal      The history is provided by the patient.   [name] presents for vomiting coffee grounds for the last two days.  [name] reports 5-6 episodes of emesis daily.  Reports not eating for 8 days because [name] is anorexic/bulemic.  [name] reports two days of coffee ground stools, 4-5 times daily and described as mucousy.  [name] has associated headache, chest pain for four days.  Chest pain feels like stabbing and burning, pain is constant in nature and spreads to her left arm.  Pt comes from home.  No fevers, no abdominal pain.  Recently had a central line placed.  Feels as if her stomach is pulsing.  Pt denies being actively suicidal but would rather not be here, [name] would rather die.  Sxs are moderate, constant, worsening.  Pt has hx/o recurrent GI bleeding.  [name] states that [name] took one Tylenol yesterday and one Tylenol at 3 AM. [name] denies any overdose or suicide attempts today.   Past Medical History  Diagnosis Date  . Migraines   . Endometriosis   . ETOH abuse     sober 3 1/2 years  . Anorexia   . Depression   . Anxiety   . DJD (degenerative joint disease) of cervical spine   . Seizures   . PICC (peripherally inserted central catheter) in place     rt neck   Past Surgical History  Procedure Laterality Date  . Knee surgery    . Abdominal surgery    . Cholecystectomy    . Appendectomy    . Abdominal hysterectomy    . Nasal sinus surgery    . Laproscopy    . Carpal tunnel release      2010   Family History  Problem Relation Age of Onset  . Hypertension Mother   . Hyperlipidemia Mother   . Heart failure Father   . Cancer Other    History  Substance Use Topics  . Smoking status: Current Every Day Smoker -- 0.25 packs/day for 20 years    Types: Cigarettes  . Smokeless tobacco:  Never Used  . Alcohol Use: No   OB History    No data available     Review of Systems  All other systems reviewed and are negative.     Allergies  Aspirin; Doxycycline; Imitrex; Iodinated diagnostic agents; Lidocaine; Prednisone; Sulfa antibiotics; Sulfasalazine; Sumatriptan; Tramadol; Levofloxacin; Latex; Levofloxacin; Metrizamide; Nsaids; and Tolmetin  Home Medications   Prior to Admission medications   Medication Sig Start Date End Date Taking? Authorizing Provider  clonazePAM (KLONOPIN) 1 MG tablet Take 1 tablet (1 mg total) by mouth 3 (three) times daily as needed for anxiety. 10/01/14   Kelvin Cellar, MD  feeding supplement, ENSURE COMPLETE, (ENSURE COMPLETE) LIQD Take 237 mLs by mouth 2 (two) times daily between meals. 10/01/14   Kelvin Cellar, MD  levETIRAcetam (KEPPRA) 500 MG tablet Take 500 mg by mouth 2 (two) times daily.    Historical Provider, MD  Oxcarbazepine (TRILEPTAL) 300 MG tablet Take 1 tablet (300 mg total) by mouth 2 (two) times daily. 10/01/14   Kelvin Cellar, MD   BP 114/70 mmHg  Pulse 110  Temp(Src) 98.7 F (37.1 C) (Oral)  Resp 16  Ht 5\' 2"  (  1.575 m)  Wt 125 lb (56.7 kg)  BMI 22.86 kg/m2  SpO2 100% Physical Exam  Constitutional: [name] is oriented to person, place, and time. [name] appears well-developed and well-nourished.  HENT:  Head: Normocephalic and atraumatic.  Cardiovascular: Normal rate and regular rhythm.   No murmur heard. Pulmonary/Chest: Effort normal and breath sounds normal. No respiratory distress.  Abdominal: Soft.  Moderate diffuse abdominal tenderness without guarding or rebound.    Musculoskeletal: [name] exhibits no edema or tenderness.  Neurological: [name] is alert and oriented to person, place, and time.  Skin: Skin is warm and dry.  Psychiatric:  Flat affect  Nursing note and vitals reviewed.   ED Course  Procedures (including critical care time) Labs Review Labs Reviewed - No data to display  Imaging Review No results  found.   EKG Interpretation   Date/Time:  Saturday October 05 2014 06:05:33 EST Ventricular Rate:  114 PR Interval:  132 QRS Duration: 89 QT Interval:  357 QTC Calculation: 492 R Axis:   77 Text Interpretation:  Sinus tachycardia Borderline prolonged QT interval  Confirmed by Glynn Octave [phone]) on 10/05/2014 6:12:04 AM      MDM   Final diagnoses:  Liver failure, acute  Suicidal thoughts    Patient here for vomiting and diarrhea, has a history of suicide attempt by Tylenol ingestion with prior liver failure. Based on laboratory evaluation and patient's history there is concern for potential recent suicide attempt. There were multiple attempts made to place a peripheral IV without success. Attempted peripherally inserted IV via ultrasound without success. Patient did agree to consent to attempt for central line. The left IJ was prepped and draped in sterile fashion and the patient was placed in Trendelenburg position. Chlorhexidine prep was used to close the skin. Marcaine was infiltrated for analgesia. Attempted to place a left IJ and patient could not tolerate the procedure despite Marcaine, the IJ was not punctured, only superficial punctures were performed. Patient did eventually get a PICC line place for resuscitation. Patient treated empirically with Mucomyst for potential acetaminophen overdose. Discussed the case with medicine who will admit the patient for further treatment. Patient without any vomiting in the department but [name] did have nausea.    Quintella Reichert, MD 10/05/14 1655

## 2014-10-05 NOTE — Progress Notes (Signed)
Patient refused ABG, dr. Ralene Bathe notified.

## 2014-10-05 NOTE — ED Notes (Signed)
IV team could not get IV started due to difficult IV start.  Dr. Ralene Bathe at bedside could not get IV started with US guided.

## 2014-10-05 NOTE — ED Notes (Signed)
Pt arrives from home via [facility] c/o of hematemesis and black tarry stools. Pt c/o central chest pain that radiates left arm. Per EMS pulse palpated in ABD. Pt does not have a hx AAA. 130/80 99% RA.

## 2014-10-05 NOTE — ED Notes (Signed)
Pt resting, laying on her side with her eyes closed

## 2014-10-05 NOTE — Progress Notes (Signed)
Received report from ED nurse. 2 central nurse asked if pt had urinated today. ED nurse has no record of pt urinating today. 2 Central nurse asked if patient is retaining urine. ED nurse completing a bladder scan.

## 2014-10-05 NOTE — Progress Notes (Addendum)
Pharmacy Consult for Mucomyst  Assessment: 49 yo female admitted with coffee ground emesis, black stools and nausea. Pt admits to taking several tylenol PMs.  LFT elevations AST 2286, ALT 414, initial APAP level 61 this morning, 6 hours later, level dropped to 27. BP stable, HR tachy up to 130.   Plan -150 mg/kg LD over 1 hour -15 mg/kg/hr until pt is asymptomatic and LFTs are near WNL -Consider transition to PO Mucomyst in 24 hr -Repeat CMP, APAP, INR in 24hr -Trend LFTs q24h until WNL -Recommend maintenance fluids  Plan has been discussed with [place], PharmD, BCPS Clinical Pharmacist Pager: [phone] 10/05/2014 8:50 PM

## 2014-10-05 NOTE — ED Notes (Signed)
Pt expresses feelings of hopelessness. Pt states that she wish her chest would blow up so she can die. Pt has a hx of anorexia and bulimia. Pt states that she takes trileptal and states she has not taken in several weeks. Pt has been wanded and sitter is at bedside.

## 2014-10-05 NOTE — ED Notes (Signed)
IV team at beside 

## 2014-10-05 NOTE — ED Notes (Signed)
Relieved night shift EMT from sitting; phlb at bedside and radiology tech is waiting outside door also

## 2014-10-05 NOTE — H&P (Signed)
[facility] History and Physical  [name] FWY:[id number] DOB: 02/19/1966 DOA: 10/05/2014  Referring physician: EDP PCP: Imelda Pillow, NP   Chief Complaint: vomiting  HPI: [name] is a 49 y.o. female  With h/o bipolar disorder, multiple previous suicide attempts, borderline personality disorder, seizures and pseudoseizures, migraines, eating disorder, h/o substance abuse presents to ED via EMS c/o coffee ground emesis and black stools.  Also c/o burning chest pain and suicidal ideation.  Was admitted to hospital with viral gastroenteritis about a week ago.  Today, AST 2286, ALT 4112. T bili 1.6. Alk phos 137.  WBC 15,000. H/h normal, potassium 3.2. BUN 23. Creat 1.47.  Lipase 90.  CT abdomen pelvis show severe hepatic steatosis.  LFTs and creatinine normal a week ago.  acetominophen level 61. Repeat 4 hours later negative.  Initially said she took 1 tylenol. After further questioning, admits to taking several tylenol PMs over the past week, but won't quantify further.  "wanted to sleep".  Family reports she has been sleepy all week and an empty tylenol bottle found in her house, with emesis containing pill fragments.  Has been unable to afford any of her psychiatric or seizure medications. Denies alcohol use or other drug use.   Feels hopeless and no longer wants to live. Has had no melena in ED, had "dark" emesis in ED, not coffee ground or bloody.  Reports previous EGD  many years ago for "stomach problems".  Review of Systems:  As per HPI. Complete systems reviewed and otherwise negative.  Past Medical History  Diagnosis Date  . Migraines   . Endometriosis   . ETOH abuse     sober 3 1/2 years  . Anorexia   . Depression   . Anxiety   . DJD (degenerative joint disease) of cervical spine   . Seizures   . PICC (peripherally inserted central catheter) in place     rt neck   Past Surgical History  Procedure Laterality Date  . Knee surgery    . Abdominal surgery     . Cholecystectomy    . Appendectomy    . Abdominal hysterectomy    . Nasal sinus surgery    . Laproscopy    . Carpal tunnel release      2010   Social History:  reports that she has been smoking Cigarettes.  She has a 5 pack-year smoking history. She has never used smokeless tobacco. She reports that she does not drink alcohol or use illicit drugs.  Allergies  Allergen Reactions  . Aspirin Anaphylaxis  . Doxycycline Anaphylaxis  . Imitrex [Sumatriptan Base] Other (See Comments)    Severe hypertension  . Iodinated Diagnostic Agents Anaphylaxis  . Lidocaine Anaphylaxis    Pt states she does well with Marcaine/Bupivicaine without problem  . Prednisone Other (See Comments)    She goes crazy  . Sulfa Antibiotics Anaphylaxis  . Sulfasalazine Anaphylaxis  . Sumatriptan Other (See Comments)    Felt like she was "having a stroke", heart rate and blood pressure "went through the roof"  . Tramadol Other (See Comments)    seizures  . Levofloxacin Other (See Comments)    Joints hurt  . Latex Rash  . Levofloxacin Rash and Other (See Comments)    Joints hurt  . Metrizamide Other (See Comments)  . Nsaids Rash and Other (See Comments)    GI bleed  . Tolmetin Rash    Family History  Problem Relation Age of Onset  .  Hypertension Mother   . Hyperlipidemia Mother   . Heart failure Father   . Cancer Other      Prior to Admission medications   Medication Sig Start Date End Date Taking? Authorizing Provider  clonazePAM (KLONOPIN) 1 MG tablet Take 1 tablet (1 mg total) by mouth 3 (three) times daily as needed for anxiety. 10/01/14  Yes Kelvin Cellar, MD  feeding supplement, ENSURE COMPLETE, (ENSURE COMPLETE) LIQD Take 237 mLs by mouth 2 (two) times daily between meals. 10/01/14  Yes Kelvin Cellar, MD  levETIRAcetam (KEPPRA) 500 MG tablet Take 500 mg by mouth 2 (two) times daily.   Yes Historical Provider, MD  Oxcarbazepine (TRILEPTAL) 300 MG tablet Take 1 tablet (300 mg total) by mouth  2 (two) times daily. 10/01/14  Yes Kelvin Cellar, MD   Physical Exam: Filed Vitals:   10/05/14 1430 10/05/14 1515 10/05/14 1530 10/05/14 1600  BP: 138/82 142/85 144/82 162/91  Pulse: 101 100 98 107  Temp:      TempSrc:      Resp: '15 16 17 19  ' Height:      Weight:      SpO2: 100% 100% 100% 100%    Wt Readings from Last 3 Encounters:  10/05/14 56.7 kg (125 lb)  10/01/14 56.836 kg (125 lb 4.8 oz)  07/15/14 63.368 kg (139 lb 11.2 oz)    BP 155/85 mmHg  Pulse 118  Temp(Src) 98.5 F (36.9 C) (Oral)  Resp 18  Ht '5\' 2"'  (1.575 m)  Wt 57.9 kg (127 lb 10.3 oz)  BMI 23.34 kg/m2  SpO2 98%  General Appearance:    Unkempt. Tearful. Cooperative. oriented  Head:    Normocephalic, without obvious abnormality, atraumatic  Eyes:    PERRL, conjunctiva/corneas clear, EOM's intact      Nose:   Nares normal, septum midline, mucosa normal, no drainage   or sinus tenderness  Throat:   Dry mucous membranes. Op clear without erythma or exudate  Neck:   Supple, symmetrical, trachea midline, no adenopathy;       thyroid:  No enlargement/tenderness/nodules; no carotid   bruit or JVD  Back:     Symmetric, no curvature, ROM normal, no CVA tenderness  Lungs:     Clear to auscultation bilaterally, respirations unlabored  Chest wall:    No tenderness or deformity  Heart:    Regular rate and rhythm, S1 and S2 normal, no murmur, rub   or gallop  Abdomen:     Soft, non-tender, bowel sounds active all four quadrants,    no masses, no organomegaly  Genitalia:    deferred  Rectal:    deferred  Extremities:   Extremities normal, atraumatic, no cyanosis or edema  Pulses:   2+ and symmetric all extremities  Skin:   Skin color, texture, turgor normal, no rashes or lesions  Lymph nodes:   Cervical, supraclavicular, and axillary nodes normal  Neurologic:   CNII-XII intact. Normal strength, sensation and reflexes      throughout             Psych: tearful, poor eye contact  Labs on Admission:  Basic  Metabolic Panel:  Recent Labs Lab 09/30/14 1649 10/01/14 0521 10/05/14 0758  NA 134* 135 139  K 2.8* 3.6 3.2*  CL 101 103 105  CO2 23 26 16*  GLUCOSE 124* 97 90  BUN '13 10 23  ' CREATININE 0.95 0.91 1.47*  CALCIUM 9.9 9.3 9.8   Liver Function Tests:  Recent Labs Lab 09/30/14 1649  10/01/14 0521 10/05/14 0758  AST 20 21 2286*  ALT 13 11 4112*  ALKPHOS 90 69 137*  BILITOT 0.7 0.6 1.6*  PROT 7.7 6.7 7.1  ALBUMIN 5.1 4.2 4.6    Recent Labs Lab 09/30/14 1649 10/05/14 0758  LIPASE 39 90*    Recent Labs Lab 10/05/14 1430  AMMONIA 78*   CBC:  Recent Labs Lab 09/30/14 1649 10/01/14 0521 10/05/14 0758  WBC 11.9* 15.9* 15.0*  NEUTROABS 6.2  --  12.5*  HGB 16.6* 13.9 14.7  HCT 45.4 39.3 41.3  MCV 86.6 90.6 87.9  PLT 411* 352 285   Cardiac Enzymes: No results for input(s): CKTOTAL, CKMB, CKMBINDEX, TROPONINI in the last 168 hours.  BNP (last 3 results) No results for input(s): PROBNP in the last 8760 hours. CBG:  Recent Labs Lab 10/01/14 0556  GLUCAP 138*    Radiological Exams on Admission: Ct Abdomen Pelvis Wo Contrast  10/05/2014   CLINICAL DATA:  Acute onset of hematemesis and melena over the past several days. Surgical history includes cholecystectomy, appendectomy and hysterectomy. Current history of depression and alcohol use.  EXAM: CT ABDOMEN AND PELVIS WITHOUT CONTRAST  TECHNIQUE: Multidetector CT imaging of the abdomen and pelvis was performed following the standard protocol without IV contrast.  COMPARISON:  09/30/2014, 01/06/2010, 03/12/2004.  FINDINGS: Severe diffuse hepatic steatosis without focal parenchymal abnormality, allowing for the unenhanced technique. Normal unenhanced appearance of the spleen, pancreas, right adrenal gland, and both kidneys. Approximate 1.6 x 1.0 cm low-attenuation nodule involving the left adrenal gland, unchanged. Gallbladder surgically absent. No unexpected biliary ductal dilation. Mild iliac atherosclerosis. No  significant lymphadenopathy.  Normal appearing stomach and small bowel. Entire colon relatively decompressed with the exception of mild gaseous distention of the sigmoid colon, though no obstructing colonic mass is identified. No ascites. No visible gastric or esophageal varices.  Urinary bladder unremarkable. Uterus surgically absent. No adnexal masses or free pelvic fluid.  Bone window images demonstrate mild degenerative changes at T9-10 with Schmorl's nodes in multiple thoracic vertebrae. Visualized lung bases clear. Heart size normal.  IMPRESSION: 1. No acute abnormality involving the abdomen or pelvis. 2. Severe diffuse hepatic steatosis. 3. Stable left adrenal adenoma.   Electronically Signed   By: Evangeline Dakin M.D.   On: 10/05/2014 15:12   Dg Chest Port 1 View  10/05/2014   CLINICAL DATA:  SOB, emesis, diarrhea x 4 days. Chest pain radiates down left arm x 3 days with headache and abdominal pain  EXAM: PORTABLE CHEST - 1 VIEW  COMPARISON:  09/30/2014.  FINDINGS: Normal sized heart. Clear lungs with normal vascularity. Mild scoliosis.  IMPRESSION: No acute abnormality.   Electronically Signed   By: Enrique Sack M.D.   On: 10/05/2014 07:55    EKG:  tracing reviewed. Sinus tachycardia  Assessment/Plan  Principal Problem:   Acute hepatitis: likely tylenol toxicity. Admit to stepdown unit. acetylcystine protocol per pharmacy. Serial LFTs, PT, PTT.  Check UDS, BAL Active Problems:   Vomiting: with reported coffee ground emesis and melena.  H/h, blood pressure stable.  Started on PPI drip in ED. Suspect MW tear or esophagitis. Monitor h/h. Hemoccult stools. GI consult in am   Hypokalemia: replete. Check mag   Bipolar disorder Borderline personality disorder   Acute renal insufficiency secondary to prerenal dehydration   Seizure disorder with h/o pseudoseizures: IV keppra for now   Hyperammonemia: monitor   Suicidal ideation: suicide precautions. Eventual psych consult H/o substance abuse:  limit opiates H/o eating disorder  Time  spent: 60 minutes  [facility] [email or web address]

## 2014-10-05 NOTE — ED Notes (Signed)
Due to nursing assessment, pt denies SI, MD agrees that patient is safe and does not need to remain on suicide precautions. Sitter discontinued at this time.

## 2014-10-06 ENCOUNTER — Encounter (HOSPITAL_COMMUNITY): Payer: Self-pay

## 2014-10-06 ENCOUNTER — Encounter (HOSPITAL_COMMUNITY)
Admission: EM | Disposition: A | Discharge status: Other Acute Inpatient Hospital | Payer: Self-pay | Source: Home / Self Care | Attending: Internal Medicine

## 2014-10-06 DIAGNOSIS — K922 Gastrointestinal hemorrhage, unspecified: Secondary | ICD-10-CM | POA: Diagnosis present

## 2014-10-06 DIAGNOSIS — F332 Major depressive disorder, recurrent severe without psychotic features: Secondary | ICD-10-CM

## 2014-10-06 DIAGNOSIS — T391X4A Poisoning by 4-Aminophenol derivatives, undetermined, initial encounter: Secondary | ICD-10-CM

## 2014-10-06 DIAGNOSIS — K921 Melena: Secondary | ICD-10-CM

## 2014-10-06 DIAGNOSIS — E876 Hypokalemia: Secondary | ICD-10-CM

## 2014-10-06 DIAGNOSIS — K7200 Acute and subacute hepatic failure without coma: Secondary | ICD-10-CM

## 2014-10-06 DIAGNOSIS — K264 Chronic or unspecified duodenal ulcer with hemorrhage: Secondary | ICD-10-CM | POA: Diagnosis present

## 2014-10-06 HISTORY — PX: ESOPHAGOGASTRODUODENOSCOPY: SHX5428

## 2014-10-06 LAB — CBC
HEMATOCRIT: 28.2 % — AB (ref 36.0–46.0)
Hemoglobin: 10.3 g/dL — ABNORMAL LOW (ref 12.0–15.0)
MCH: 31.3 pg (ref 26.0–34.0)
MCHC: 36.5 g/dL — AB (ref 30.0–36.0)
MCV: 85.7 fL (ref 78.0–100.0)
Platelets: 272 10*3/uL (ref 150–400)
RBC: 3.29 MIL/uL — ABNORMAL LOW (ref 3.87–5.11)
RDW: 14.1 % (ref 11.5–15.5)
WBC: 9.6 10*3/uL (ref 4.0–10.5)

## 2014-10-06 LAB — COMPREHENSIVE METABOLIC PANEL
ALBUMIN: 2.8 g/dL — AB (ref 3.5–5.2)
ALT: 1678 U/L — ABNORMAL HIGH (ref 0–35)
AST: 425 U/L — ABNORMAL HIGH (ref 0–37)
Alkaline Phosphatase: 80 U/L (ref 39–117)
Anion gap: 9 (ref 5–15)
BUN: 21 mg/dL (ref 6–23)
CALCIUM: 8.7 mg/dL (ref 8.4–10.5)
CHLORIDE: 116 meq/L — AB (ref 96–112)
CO2: 19 mmol/L (ref 19–32)
Creatinine, Ser: 0.89 mg/dL (ref 0.50–1.10)
GFR calc Af Amer: 87 mL/min — ABNORMAL LOW (ref >90)
GFR calc non Af Amer: 75 mL/min — ABNORMAL LOW (ref >90)
Glucose, Bld: 99 mg/dL (ref 70–99)
Potassium: 2.7 mmol/L — CL (ref 3.5–5.1)
Sodium: 144 mmol/L (ref 135–145)
Total Bilirubin: 2.7 mg/dL — ABNORMAL HIGH (ref 0.3–1.2)
Total Protein: 4.6 g/dL — ABNORMAL LOW (ref 6.0–8.3)

## 2014-10-06 LAB — PROTIME-INR
INR: 1.29 (ref 0.00–1.49)
PROTHROMBIN TIME: 16.2 s — AB (ref 11.6–15.2)

## 2014-10-06 LAB — HEPATITIS PANEL, ACUTE
HCV AB: NEGATIVE
HEP B S AG: NEGATIVE
Hep A IgM: NONREACTIVE
Hep B C IgM: NONREACTIVE

## 2014-10-06 LAB — APTT: APTT: 31 s (ref 24–37)

## 2014-10-06 LAB — ACETAMINOPHEN LEVEL: Acetaminophen (Tylenol), Serum: <10 ug/mL — ABNORMAL LOW (ref 10–30)

## 2014-10-06 SURGERY — EGD (ESOPHAGOGASTRODUODENOSCOPY)
Anesthesia: Moderate Sedation

## 2014-10-06 MED ORDER — LORAZEPAM 2 MG/ML IJ SOLN: 1.0000 mg | Freq: Four times a day (QID) | INTRAMUSCULAR | Status: DC | PRN

## 2014-10-06 MED ORDER — FENTANYL CITRATE 0.05 MG/ML IJ SOLN
INTRAMUSCULAR | Status: DC | PRN
  Administered 2014-10-06 (×2): 25 ug via INTRAVENOUS

## 2014-10-06 MED ORDER — FENTANYL CITRATE 0.05 MG/ML IJ SOLN
INTRAMUSCULAR | Status: AC
  Filled 2014-10-06: qty 2

## 2014-10-06 MED ORDER — POTASSIUM CHLORIDE 10 MEQ/100ML IV SOLN
10.0000 meq | INTRAVENOUS | Status: AC
  Administered 2014-10-06 (×4): 10 meq via INTRAVENOUS
  Filled 2014-10-06 (×4): qty 100

## 2014-10-06 MED ORDER — FENTANYL CITRATE 0.05 MG/ML IJ SOLN
50.0000 ug | Freq: Once | INTRAMUSCULAR | Status: AC
  Administered 2014-10-06: 50 ug via INTRAVENOUS
  Filled 2014-10-06: qty 2

## 2014-10-06 MED ORDER — DIPHENHYDRAMINE HCL 50 MG/ML IJ SOLN
INTRAMUSCULAR | Status: AC
  Filled 2014-10-06: qty 1

## 2014-10-06 MED ORDER — DIPHENHYDRAMINE HCL 50 MG/ML IJ SOLN
INTRAMUSCULAR | Status: DC | PRN
  Administered 2014-10-06: 12.5 mg via INTRAVENOUS

## 2014-10-06 MED ORDER — FLUOXETINE HCL 20 MG/5ML PO SOLN
10.0000 mg | Freq: Every day | ORAL | Status: DC
  Administered 2014-10-07: 10 mg via ORAL
  Filled 2014-10-06 (×3): qty 5

## 2014-10-06 MED ORDER — MIDAZOLAM HCL 5 MG/ML IJ SOLN
INTRAMUSCULAR | Status: AC
  Filled 2014-10-06: qty 2

## 2014-10-06 MED ORDER — MIDAZOLAM HCL 5 MG/5ML IJ SOLN
INTRAMUSCULAR | Status: DC | PRN
  Administered 2014-10-06: 1 mg via INTRAVENOUS
  Administered 2014-10-06 (×2): 2 mg via INTRAVENOUS

## 2014-10-06 MED ORDER — LORAZEPAM 2 MG/ML IJ SOLN
1.0000 mg | Freq: Once | INTRAMUSCULAR | Status: AC
  Administered 2014-10-06: 1 mg via INTRAVENOUS
  Filled 2014-10-06: qty 1

## 2014-10-06 NOTE — Progress Notes (Signed)
Pt reported about fall down the stairs at home. MD notified and right and left shoulder X-ray ordered.   Hart Rochester, RN, BSN

## 2014-10-06 NOTE — Progress Notes (Signed)
Poison control called RN and recommended stopping acetylcysteine.  Doree Barthel, MD

## 2014-10-06 NOTE — Progress Notes (Addendum)
Endoscopy demonstrated several duodenal ulcers in the bulb with stigmata of recent hemorrhage.  Recommend twice a day PPI therapy for 2 weeks and then daily.  Patient may start clear liquids.

## 2014-10-06 NOTE — Op Note (Signed)
[facility] [place], [address]   ENDOSCOPY PROCEDURE REPORT  PATIENT: Hailey, [name]  MR#: [id number] BIRTHDATE: November 28, 1965 , 48  yrs. old GENDER: female ENDOSCOPIST: Inda Castle, MD REFERRED BY: PROCEDURE DATE:  10/06/2014 PROCEDURE:  EGD w/ biopsy ASA CLASS:     Class II INDICATIONS:  melena and vomiting. MEDICATIONS: Versed 5 mg IV, Fentanyl 50 mcg IV, and Benadryl 12.5 mg IV TOPICAL ANESTHETIC:  DESCRIPTION OF PROCEDURE: After the risks benefits and alternatives of the procedure were thoroughly explained, informed consent was obtained.  The Pentax Gastroscope [id number] endoscope was introduced through the mouth and advanced to the second portion of the duodenum , Without limitations.  The instrument was slowly withdrawn as the mucosa was fully examined.    In the duodenal bulb there are at least 3 ulcers, the largest measuring 1 cm.  There was old pigmented at the base of one ulcer. Surrounding because was markedly inflamed.  Biopsy was taken to rule out H pylori.  There was no fresh or old blood. Except for the findings listed the EGD was otherwise normal.  Retroflexed views revealed no abnormalities.     The scope was then withdrawn from the patient and the procedure completed.  COMPLICATIONS: There were no immediate complications.  ENDOSCOPIC IMPRESSION: 1. ulcers, duodenal bulb, with stigmata of recent hemorrhage  RECOMMENDATIONS: 1.  twice a day PPI therapy for 2 weeks and then daily 2.  Check biopsy for H pylori  REPEAT EXAM:  eSigned:  Inda Castle, MD 10/06/2014 12:31 PM    CC:

## 2014-10-06 NOTE — Progress Notes (Signed)
CRITICAL VALUE ALERT  Critical value received:  K 2.7  Date of notification:  10/06/14  Time of notification:  0735  Critical value read back: YES  Nurse who received alert:  Sherryl Manges  MD notified (1st page):  Conley Canal  Time of first page:  [phone]  MD notified (2nd page):  Time of second page:  Responding MD:    Time MD responded:

## 2014-10-06 NOTE — Progress Notes (Signed)
I was called into the patients room by sitter who said pt was refusing to eat . Pt stated to me multiple times "I dont trust you because of all these disturbing images you have been showing me". Pt states she is upset and will not take any food or drink. Pt is oriented to person place and time. MD was notified.  Dorena Cookey, RN

## 2014-10-06 NOTE — Consult Note (Signed)
[name]   Reason for [name]:  Recurrent overdoses, depression Referring Physician:  Dr. Gary Fleet is an 49 y.o. female. Total Time spent with patient: 30 minutes  Assessment: AXIS I:  Major Depression, Recurrent severe AXIS II:  Cluster B Traits AXIS III:   Past Medical History  Diagnosis Date  . Migraines   . Endometriosis   . ETOH abuse     sober 3 1/2 years  . Anorexia   . Depression   . Anxiety   . DJD (degenerative joint disease) of cervical spine   . Seizures   . PICC (peripherally inserted central catheter) in place     rt neck   AXIS IV:  economic problems, problems related to social environment and problems with primary support group AXIS V:  1-10 persistent dangerousness to self and others present  Plan:  Recommend psychiatric Inpatient admission when medically cleared.  Subjective:   [name] is a 49 y.o. female patient admitted with coffee ground emesis and black stools.  HPI: This patient is a 49 year old single white female who lives alone in [place]. She works as a Cytogeneticist.  The patient was admitted after presenting to the ED complaining of coffee-ground emesis and black stools, burning chest pain and suicidal ideation. She admitted to primary M.D. that she took several Tylenol PM's over the past week claiming she wanted to sleep. A family member found empty Tylenol bottle in her house and emesis containing pill fragments. At present she is very irritable and refuses to answer questions directly. She does admit that she no longer wants to live because "my family doesn't want me here". She's unable to contract for safety but will not say whether or not she would harm herself if allowed to leave the hospital in the future.  The patient has past psychiatric treatment with Dr. Sabra Heck but has not been able to afford any of her psychiatric her seizure medications because she has no health  insurance. The patient cut the assessment short because she didn't want to be questioned any further and  became angry. She does deny auditory or visual hallucinations paranoid ideation or substance abuse. Drug screen is positive for benzodiazepines. HPI Elements:   Location:  Global. Quality:  Severe. Severity:  Severe. Timing:  One week. Duration:  Years. Context:  Conflicts with family.  Past Psychiatric History: Past Medical History  Diagnosis Date  . Migraines   . Endometriosis   . ETOH abuse     sober 3 1/2 years  . Anorexia   . Depression   . Anxiety   . DJD (degenerative joint disease) of cervical spine   . Seizures   . PICC (peripherally inserted central catheter) in place     rt neck    reports that she has been smoking Cigarettes.  She has a 5 pack-year smoking history. She has never used smokeless tobacco. She reports that she does not drink alcohol or use illicit drugs. Family History  Problem Relation Age of Onset  . Hypertension Mother   . Hyperlipidemia Mother   . Heart failure Father   . Cancer Other      Living Arrangements: Alone   Abuse/Neglect [facility]) Physical Abuse: Denies Verbal Abuse: Denies Sexual Abuse: Denies Allergies:   Allergies  Allergen Reactions  . Aspirin Anaphylaxis  . Doxycycline Anaphylaxis  . Imitrex [Sumatriptan Base] Other (See Comments)    Severe hypertension  . Iodinated Diagnostic Agents Anaphylaxis  .  Lidocaine Anaphylaxis    Pt states she does well with Marcaine/Bupivicaine without problem  . Prednisone Other (See Comments)    She goes crazy  . Sulfa Antibiotics Anaphylaxis  . Sulfasalazine Anaphylaxis  . Sumatriptan Other (See Comments)    Felt like she was "having a stroke", heart rate and blood pressure "went through the roof"  . Tramadol Other (See Comments)    seizures  . Levofloxacin Other (See Comments)    Joints hurt  . Latex Rash  . Levofloxacin Rash and Other (See Comments)    Joints hurt  . Metrizamide  Other (See Comments)  . Nsaids Rash and Other (See Comments)    GI bleed  . Tolmetin Rash     Objective: Blood pressure 136/82, pulse 90, temperature 97.9 F (36.6 C), temperature source Oral, resp. rate 18, height $RemoveBe'5\' 2"'jhiyDeiiC$  (1.575 m), weight 127 lb 10.3 oz (57.9 kg), SpO2 98 %.Body mass index is 23.34 kg/(m^2). Results for orders placed or performed during the hospital encounter of 10/05/14 (from the past 72 hour(s))  Type and screen     Status: None   Collection Time: 10/05/14  7:55 AM  Result Value Ref Range   ABO/RH(D) O POS    Antibody Screen NEG    Sample Expiration 10/08/2014   ABO/Rh     Status: None   Collection Time: 10/05/14  7:55 AM  Result Value Ref Range   ABO/RH(D) O POS   Comprehensive metabolic panel     Status: Abnormal   Collection Time: 10/05/14  7:58 AM  Result Value Ref Range   Sodium 139 135 - 145 mmol/L    Comment: Please note change in reference range.   Potassium 3.2 (L) 3.5 - 5.1 mmol/L    Comment: Please note change in reference range.   Chloride 105 96 - 112 mEq/L   CO2 16 (L) 19 - 32 mmol/L   Glucose, Bld 90 70 - 99 mg/dL   BUN 23 6 - 23 mg/dL   Creatinine, Ser 1.47 (H) 0.50 - 1.10 mg/dL   Calcium 9.8 8.4 - 10.5 mg/dL   Total Protein 7.1 6.0 - 8.3 g/dL   Albumin 4.6 3.5 - 5.2 g/dL   AST 2286 (H) 0 - 37 U/L    Comment: RESULTS CONFIRMED BY MANUAL DILUTION   ALT 4112 (H) 0 - 35 U/L    Comment: RESULTS CONFIRMED BY MANUAL DILUTION   Alkaline Phosphatase 137 (H) 39 - 117 U/L   Total Bilirubin 1.6 (H) 0.3 - 1.2 mg/dL   GFR calc non Af Amer 41 (L) >90 mL/min   GFR calc Af Amer 48 (L) >90 mL/min    Comment: (NOTE) The eGFR has been calculated using the CKD EPI equation. This calculation has not been validated in all clinical situations. eGFR's persistently <90 mL/min signify possible Chronic Kidney Disease.    Anion gap 18 (H) 5 - 15  Lipase, blood     Status: Abnormal   Collection Time: 10/05/14  7:58 AM  Result Value Ref Range   Lipase 90 (H)  11 - 59 U/L  CBC with Differential     Status: Abnormal   Collection Time: 10/05/14  7:58 AM  Result Value Ref Range   WBC 15.0 (H) 4.0 - 10.5 K/uL    Comment: WHITE COUNT CONFIRMED ON SMEAR   RBC 4.70 3.87 - 5.11 MIL/uL   Hemoglobin 14.7 12.0 - 15.0 g/dL   HCT 41.3 36.0 - 46.0 %   MCV 87.9 78.0 -  100.0 fL   MCH 31.3 26.0 - 34.0 pg   MCHC 35.6 30.0 - 36.0 g/dL   RDW 14.3 11.5 - 15.5 %   Platelets 285 150 - 400 K/uL    Comment: PLATELET COUNT CONFIRMED BY SMEAR   Neutrophils Relative % 84 (H) 43 - 77 %   Lymphocytes Relative 15 12 - 46 %   Monocytes Relative 1 (L) 3 - 12 %   Eosinophils Relative 0 0 - 5 %   Basophils Relative 0 0 - 1 %   Neutro Abs 12.5 (H) 1.7 - 7.7 K/uL   Lymphs Abs 2.3 0.7 - 4.0 K/uL   Monocytes Absolute 0.2 0.1 - 1.0 K/uL   Eosinophils Absolute 0.0 0.0 - 0.7 K/uL   Basophils Absolute 0.0 0.0 - 0.1 K/uL   RBC Morphology BURR CELLS   Protime-INR     Status: Abnormal   Collection Time: 10/05/14  7:58 AM  Result Value Ref Range   Prothrombin Time 17.8 (H) 11.6 - 15.2 seconds   INR 1.45 0.00 - 1.49  Acetaminophen level     Status: Abnormal   Collection Time: 10/05/14  7:58 AM  Result Value Ref Range   Acetaminophen (Tylenol), Serum 61.0 (H) 10 - 30 ug/mL    Comment:        THERAPEUTIC CONCENTRATIONS VARY SIGNIFICANTLY. A RANGE OF 10-30 ug/mL MAY BE AN EFFECTIVE CONCENTRATION FOR MANY PATIENTS. HOWEVER, SOME ARE BEST TREATED AT CONCENTRATIONS OUTSIDE THIS RANGE. ACETAMINOPHEN CONCENTRATIONS >150 ug/mL AT 4 HOURS AFTER INGESTION AND >50 ug/mL AT 12 HOURS AFTER INGESTION ARE OFTEN ASSOCIATED WITH TOXIC REACTIONS.   Salicylate level     Status: None   Collection Time: 10/05/14  7:58 AM  Result Value Ref Range   Salicylate Lvl <5.4 2.8 - 20.0 mg/dL  Urinalysis, Routine w reflex microscopic     Status: Abnormal   Collection Time: 10/05/14  8:22 AM  Result Value Ref Range   Color, Urine AMBER (A) YELLOW    Comment: BIOCHEMICALS MAY BE AFFECTED BY  COLOR   APPearance CLEAR CLEAR   Specific Gravity, Urine 1.031 (H) 1.005 - 1.030   pH 6.0 5.0 - 8.0   Glucose, UA NEGATIVE NEGATIVE mg/dL   Hgb urine dipstick NEGATIVE NEGATIVE   Bilirubin Urine MODERATE (A) NEGATIVE   Ketones, ur 15 (A) NEGATIVE mg/dL   Protein, ur NEGATIVE NEGATIVE mg/dL   Urobilinogen, UA 1.0 0.0 - 1.0 mg/dL   Nitrite NEGATIVE NEGATIVE   Leukocytes, UA NEGATIVE NEGATIVE    Comment: MICROSCOPIC NOT DONE ON URINES WITH NEGATIVE PROTEIN, BLOOD, LEUKOCYTES, NITRITE, OR GLUCOSE <1000 mg/dL.  Acetaminophen level     Status: None   Collection Time: 10/05/14 12:28 PM  Result Value Ref Range   Acetaminophen (Tylenol), Serum 27.0 10 - 30 ug/mL    Comment:        THERAPEUTIC CONCENTRATIONS VARY SIGNIFICANTLY. A RANGE OF 10-30 ug/mL MAY BE AN EFFECTIVE CONCENTRATION FOR MANY PATIENTS. HOWEVER, SOME ARE BEST TREATED AT CONCENTRATIONS OUTSIDE THIS RANGE. ACETAMINOPHEN CONCENTRATIONS >150 ug/mL AT 4 HOURS AFTER INGESTION AND >50 ug/mL AT 12 HOURS AFTER INGESTION ARE OFTEN ASSOCIATED WITH TOXIC REACTIONS.   Pregnancy, urine     Status: None   Collection Time: 10/05/14  1:27 PM  Result Value Ref Range   Preg Test, Ur NEGATIVE NEGATIVE    Comment:        THE SENSITIVITY OF THIS METHODOLOGY IS >20 mIU/mL.   I-Stat CG4 Lactic Acid, ED  Status: None   Collection Time: 10/05/14  2:02 PM  Result Value Ref Range   Lactic Acid, Venous 1.00 0.5 - 2.2 mmol/L  I-Stat venous blood gas, ED     Status: Abnormal   Collection Time: 10/05/14  2:02 PM  Result Value Ref Range   pH, Ven 7.364 (H) 7.250 - 7.300   pCO2, Ven 28.1 (L) 45.0 - 50.0 mmHg   pO2, Ven 72.0 (H) 30.0 - 45.0 mmHg   Bicarbonate 16.0 (L) 20.0 - 24.0 mEq/L   TCO2 17 0 - 100 mmol/L   O2 Saturation 94.0 %   Acid-base deficit 8.0 (H) 0.0 - 2.0 mmol/L   Sample type VENOUS   Ammonia     Status: Abnormal   Collection Time: 10/05/14  2:30 PM  Result Value Ref Range   Ammonia 78 (H) 11 - 32 umol/L    Comment:  Please note change in reference range.  MRSA PCR Screening     Status: None   Collection Time: 10/05/14  8:27 PM  Result Value Ref Range   MRSA by PCR NEGATIVE NEGATIVE    Comment:        The GeneXpert MRSA Assay (FDA approved for NASAL specimens only), is one component of a comprehensive MRSA colonization surveillance program. It is not intended to diagnose MRSA infection nor to guide or monitor treatment for MRSA infections.   Ethanol     Status: None   Collection Time: 10/05/14 10:22 PM  Result Value Ref Range   Alcohol, Ethyl (B) <5 0 - 9 mg/dL    Comment:        LOWEST DETECTABLE LIMIT FOR SERUM ALCOHOL IS 11 mg/dL FOR MEDICAL PURPOSES ONLY REPEATED TO VERIFY   Magnesium     Status: None   Collection Time: 10/05/14 10:22 PM  Result Value Ref Range   Magnesium 2.5 1.5 - 2.5 mg/dL  Urine rapid drug screen (hosp performed)     Status: Abnormal   Collection Time: 10/05/14 10:26 PM  Result Value Ref Range   Opiates NONE DETECTED NONE DETECTED   Cocaine NONE DETECTED NONE DETECTED   Benzodiazepines POSITIVE (A) NONE DETECTED   Amphetamines NONE DETECTED NONE DETECTED   Tetrahydrocannabinol NONE DETECTED NONE DETECTED   Barbiturates NONE DETECTED NONE DETECTED    Comment:        DRUG SCREEN FOR MEDICAL PURPOSES ONLY.  IF CONFIRMATION IS NEEDED FOR ANY PURPOSE, NOTIFY LAB WITHIN 5 DAYS.        LOWEST DETECTABLE LIMITS FOR URINE DRUG SCREEN Drug Class       Cutoff (ng/mL) Amphetamine      1000 Barbiturate      200 Benzodiazepine   732 Tricyclics       202 Opiates          300 Cocaine          300 THC              50   Comprehensive metabolic panel     Status: Abnormal   Collection Time: 10/06/14  5:06 AM  Result Value Ref Range   Sodium 144 135 - 145 mmol/L    Comment: Please note change in reference range.   Potassium 2.7 (LL) 3.5 - 5.1 mmol/L    Comment: Please note change in reference range. REPEATED TO VERIFY CRITICAL RESULT CALLED TO, READ BACK BY AND  VERIFIED WITH: ENNISSRN [id number] MCCAULEG    Chloride 116 (H) 96 - 112 mEq/L   CO2  19 19 - 32 mmol/L   Glucose, Bld 99 70 - 99 mg/dL   BUN 21 6 - 23 mg/dL   Creatinine, Ser 0.89 0.50 - 1.10 mg/dL   Calcium 8.7 8.4 - 10.5 mg/dL   Total Protein 4.6 (L) 6.0 - 8.3 g/dL   Albumin 2.8 (L) 3.5 - 5.2 g/dL   AST 425 (H) 0 - 37 U/L   ALT 1678 (H) 0 - 35 U/L   Alkaline Phosphatase 80 39 - 117 U/L   Total Bilirubin 2.7 (H) 0.3 - 1.2 mg/dL   GFR calc non Af Amer 75 (L) >90 mL/min   GFR calc Af Amer 87 (L) >90 mL/min    Comment: (NOTE) The eGFR has been calculated using the CKD EPI equation. This calculation has not been validated in all clinical situations. eGFR's persistently <90 mL/min signify possible Chronic Kidney Disease.    Anion gap 9 5 - 15  CBC     Status: Abnormal   Collection Time: 10/06/14  5:06 AM  Result Value Ref Range   WBC 9.6 4.0 - 10.5 K/uL   RBC 3.29 (L) 3.87 - 5.11 MIL/uL   Hemoglobin 10.3 (L) 12.0 - 15.0 g/dL    Comment: DELTA CHECK NOTED REPEATED TO VERIFY    HCT 28.2 (L) 36.0 - 46.0 %   MCV 85.7 78.0 - 100.0 fL   MCH 31.3 26.0 - 34.0 pg   MCHC 36.5 (H) 30.0 - 36.0 g/dL   RDW 14.1 11.5 - 15.5 %   Platelets 272 150 - 400 K/uL  Protime-INR     Status: Abnormal   Collection Time: 10/06/14  5:06 AM  Result Value Ref Range   Prothrombin Time 16.2 (H) 11.6 - 15.2 seconds   INR 1.29 0.00 - 1.49  APTT     Status: None   Collection Time: 10/06/14  5:06 AM  Result Value Ref Range   aPTT 31 24 - 37 seconds  Acetaminophen level     Status: Abnormal   Collection Time: 10/06/14  5:06 AM  Result Value Ref Range   Acetaminophen (Tylenol), Serum <10.0 (L) 10 - 30 ug/mL    Comment:        THERAPEUTIC CONCENTRATIONS VARY SIGNIFICANTLY. A RANGE OF 10-30 ug/mL MAY BE AN EFFECTIVE CONCENTRATION FOR MANY PATIENTS. HOWEVER, SOME ARE BEST TREATED AT CONCENTRATIONS OUTSIDE THIS RANGE. ACETAMINOPHEN CONCENTRATIONS >150 ug/mL AT 4 HOURS AFTER INGESTION AND >50 ug/mL AT  12 HOURS AFTER INGESTION ARE OFTEN ASSOCIATED WITH TOXIC REACTIONS.    Labs are reviewed and are pertinent for acute liver failure  Current Facility-Administered Medications  Medication Dose Route Frequency Provider Last Rate Last Dose  . 0.9 % NaCl with KCl 40 mEq / L  infusion   Intravenous Continuous Delfina Redwood, MD 100 mL/hr at 10/06/14 0643 100 mL/hr at 10/06/14 0643  . levETIRAcetam (KEPPRA) IVPB 500 mg/100 mL premix  500 mg Intravenous Q12H Delfina Redwood, MD   500 mg at 10/06/14 0837  . ondansetron (ZOFRAN) tablet 4 mg  4 mg Oral Q6H PRN Delfina Redwood, MD       Or  . ondansetron [facility]) injection 4 mg  4 mg Intravenous Q6H PRN Delfina Redwood, MD   4 mg at 10/06/14 0519  . pantoprazole (PROTONIX) 80 mg in sodium chloride 0.9 % 250 mL (0.32 mg/mL) infusion  8 mg/hr Intravenous Continuous Quintella Reichert, MD 25 mL/hr at 10/06/14 0158 8 mg/hr at 10/06/14 0158  . potassium chloride 10 mEq  in 100 mL IVPB  10 mEq Intravenous Q1 Hr x 4 Delfina Redwood, MD   10 mEq at 10/06/14 1409  . sodium chloride 0.9 % injection 10-40 mL  10-40 mL Intracatheter Q12H Quintella Reichert, MD   10 mL at 10/06/14 0945  . sodium chloride 0.9 % injection 10-40 mL  10-40 mL Intracatheter PRN Quintella Reichert, MD        Psychiatric Specialty Exam:     Blood pressure 136/82, pulse 90, temperature 97.9 F (36.6 C), temperature source Oral, resp. rate 18, height $RemoveBe'5\' 2"'UejxOlWCv$  (1.575 m), weight 127 lb 10.3 oz (57.9 kg), SpO2 98 %.Body mass index is 23.34 kg/(m^2).  General Appearance: Fairly Groomed and Guarded  Engineer, water::  Minimal  Speech:  Slow  Volume:  Decreased  Mood:  Angry, Anxious and Hopeless  Affect:  Constricted, Depressed, Flat and Inappropriate  Thought Process:  Loose  Orientation:  Full (Time, Place, and Person)  Thought Content:  Rumination  Suicidal Thoughts:  Yes.  with intent/plan  Homicidal Thoughts:  No  Memory:  Immediate;   Poor Recent;   Poor Remote;   Poor   Judgement:  Impaired  Insight:  Lacking  Psychomotor Activity:  Decreased  Concentration:  Fair  Recall:  Poor  Fund of Knowledge:Poor  Language: Fair  Akathisia:  No  Handed:  Right  AIMS (if indicated):     Assets:  Communication Skills  Sleep:      Musculoskeletal: Strength & Muscle Tone: within normal limits Gait & Station: unsteady Patient leans: N/A  Treatment Plan Summary: Daily contact with patient to assess and evaluate symptoms and progress in treatment Medication management  Patient is very difficult to assess today due to lack of cooperation. We can start low-dose Prozac oral solution carefully as her liver functions are impaired right now. Suggest reconsulting psychiatry tomorrow to reassess  Harrington Challenger [facility] 10/06/2014 2:52 PM

## 2014-10-06 NOTE — Progress Notes (Addendum)
[facility] PROGRESS NOTE  [name] NFA:[id number] DOB: [birth year] DOA: 10/05/2014 PCP: Imelda Pillow, NP  Assessment/Plan:  Principal Problem:   Acute liver injury secondary to tylenol toxicity. LFTs, INR improving. Continue acetylcysteine per pharmacy Active Problems:   Intractable Vomiting for >1 week with coffee ground emesis. One episode of "dark" emesis in ED, none further and no stools.  BUN only 21. H/h dropped, but patient aggressively hydrated so, likely in part dilutional. Continue PPI.  Consult GI. NPO since midnight.   Suicidal ideation/probable attempt: patient reports feeling slightly better, but still depressed.  Landscape architect. Consult psych. Multiple previous intentional overdoses. BAL < 10. UDS with benzodiazepines   Hypokalemia: replete IV.  Mag ok.   Bipolar disorder and Borderline personality disorder   Acute renal insufficiency secondary to prerenal azotemia:  resolving   Seizure disorder with h/o pseudoseizures per old records. IV keppra   Hyperammonemia H/o eating disorder per old records  HPI/Subjective: "thirsty". No vomiting overnight. Some burning epigastric pain.  No stools. Feels slightly more hopeful today.  Objective: Filed Vitals:   10/06/14 0643  BP:   Pulse:   Temp: 99 F (37.2 C)  Resp:     Intake/Output Summary (Last 24 hours) at 10/06/14 0816 Last data filed at 10/06/14 0700  Gross per 24 hour  Intake 1564.38 ml  Output    930 ml  Net 634.38 ml   Filed Weights   10/05/14 0616 10/05/14 1953  Weight: 56.7 kg (125 lb) 57.9 kg (127 lb 10.3 oz)    Exam:   General:  Sleepy. Arousable. Calm and cooperative  HEENT: dry MM  Cardiovascular: RRR without MGR   Respiratory: CTA without WRR  Abdomen: S, epigastric tenderness, ND  Ext: no CCE  Basic Metabolic Panel:  Recent Labs Lab 09/30/14 1649 10/01/14 0521 10/05/14 0758 10/05/14 2222 10/06/14 0506  NA 134* 135 139  --  144  K 2.8* 3.6 3.2*  --  2.7*   CL 101 103 105  --  116*  CO2 23 26 16*  --  19  GLUCOSE 124* 97 90  --  99  BUN 13 10 23   --  21  CREATININE 0.95 0.91 1.47*  --  0.89  CALCIUM 9.9 9.3 9.8  --  8.7  MG  --   --   --  2.5  --    Liver Function Tests:  Recent Labs Lab 09/30/14 1649 10/01/14 0521 10/05/14 0758 10/06/14 0506  AST 20 21 2286* 425*  ALT 13 11 4112* 1678*  ALKPHOS 90 69 137* 80  BILITOT 0.7 0.6 1.6* 2.7*  PROT 7.7 6.7 7.1 4.6*  ALBUMIN 5.1 4.2 4.6 2.8*    Recent Labs Lab 09/30/14 1649 10/05/14 0758  LIPASE 39 90*    Recent Labs Lab 10/05/14 1430  AMMONIA 78*   CBC:  Recent Labs Lab 09/30/14 1649 10/01/14 0521 10/05/14 0758 10/06/14 0506  WBC 11.9* 15.9* 15.0* 9.6  NEUTROABS 6.2  --  12.5*  --   HGB 16.6* 13.9 14.7 10.3*  HCT 45.4 39.3 41.3 28.2*  MCV 86.6 90.6 87.9 85.7  PLT 411* 352 285 272   Cardiac Enzymes: No results for input(s): CKTOTAL, CKMB, CKMBINDEX, TROPONINI in the last 168 hours. BNP (last 3 results) No results for input(s): PROBNP in the last 8760 hours. CBG:  Recent Labs Lab 10/01/14 0556  GLUCAP 138*    Recent Results (from the past 240 hour(s))  MRSA PCR Screening     Status:  None   Collection Time: 10/05/14  8:27 PM  Result Value Ref Range Status   MRSA by PCR NEGATIVE NEGATIVE Final    Comment:        The GeneXpert MRSA Assay (FDA approved for NASAL specimens only), is one component of a comprehensive MRSA colonization surveillance program. It is not intended to diagnose MRSA infection nor to guide or monitor treatment for MRSA infections.      Studies: Ct Abdomen Pelvis Wo Contrast  10/05/2014   CLINICAL DATA:  Acute onset of hematemesis and melena over the past several days. Surgical history includes cholecystectomy, appendectomy and hysterectomy. Current history of depression and alcohol use.  EXAM: CT ABDOMEN AND PELVIS WITHOUT CONTRAST  TECHNIQUE: Multidetector CT imaging of the abdomen and pelvis was performed following the  standard protocol without IV contrast.  COMPARISON:  09/30/2014, 01/06/2010, 03/12/2004.  FINDINGS: Severe diffuse hepatic steatosis without focal parenchymal abnormality, allowing for the unenhanced technique. Normal unenhanced appearance of the spleen, pancreas, right adrenal gland, and both kidneys. Approximate 1.6 x 1.0 cm low-attenuation nodule involving the left adrenal gland, unchanged. Gallbladder surgically absent. No unexpected biliary ductal dilation. Mild iliac atherosclerosis. No significant lymphadenopathy.  Normal appearing stomach and small bowel. Entire colon relatively decompressed with the exception of mild gaseous distention of the sigmoid colon, though no obstructing colonic mass is identified. No ascites. No visible gastric or esophageal varices.  Urinary bladder unremarkable. Uterus surgically absent. No adnexal masses or free pelvic fluid.  Bone window images demonstrate mild degenerative changes at T9-10 with Schmorl's nodes in multiple thoracic vertebrae. Visualized lung bases clear. Heart size normal.  IMPRESSION: 1. No acute abnormality involving the abdomen or pelvis. 2. Severe diffuse hepatic steatosis. 3. Stable left adrenal adenoma.   Electronically Signed   By: Evangeline Dakin M.D.   On: 10/05/2014 15:12   Dg Shoulder Right  10/05/2014   CLINICAL DATA:  Status post fall down steps, with persistent right shoulder pain. Initial encounter.  EXAM: RIGHT SHOULDER - 2+ VIEW  COMPARISON:  Right shoulder radiographs performed 01/06/2010  FINDINGS: There is no evidence of fracture or dislocation. The right humeral head is seated within the glenoid fossa. Minimal degenerative change is noted at the right acromioclavicular joint. No significant soft tissue abnormalities are seen. The visualized portions of the right lung are clear.  IMPRESSION: No evidence of fracture or dislocation.   Electronically Signed   By: Garald Balding M.D.   On: 10/05/2014 21:54   Dg Chest Port 1  View  10/05/2014   CLINICAL DATA:  SOB, emesis, diarrhea x 4 days. Chest pain radiates down left arm x 3 days with headache and abdominal pain  EXAM: PORTABLE CHEST - 1 VIEW  COMPARISON:  09/30/2014.  FINDINGS: Normal sized heart. Clear lungs with normal vascularity. Mild scoliosis.  IMPRESSION: No acute abnormality.   Electronically Signed   By: Enrique Sack M.D.   On: 10/05/2014 07:55   Dg Shoulder Left  10/05/2014   CLINICAL DATA:  Status post fall, with persistent left shoulder pain. Initial encounter.  EXAM: LEFT SHOULDER - 2+ VIEW  COMPARISON:  None.  FINDINGS: There is no evidence of fracture or dislocation. The left humeral head is seated within the glenoid fossa. The acromioclavicular joint is unremarkable in appearance. No significant soft tissue abnormalities are seen. The visualized portions of the left lung are clear.  IMPRESSION: No evidence of fracture or dislocation.   Electronically Signed   By: Francoise Schaumann.D.  On: 10/05/2014 21:54    Scheduled Meds: . levETIRAcetam  500 mg Intravenous Q12H  . potassium chloride  10 mEq Intravenous Q1 Hr x 4  . sodium chloride  10-40 mL Intracatheter Q12H   Continuous Infusions: . 0.9 % NaCl with KCl 40 mEq / L 100 mL/hr (10/06/14 0722)  . acetylcysteine 15 mg/kg/hr (10/05/14 2100)  . pantoprozole (PROTONIX) infusion 8 mg/hr (10/06/14 0158)    Time spent: 35 minutes  Alvarado L  [email or web address], password [facility] 10/06/2014, 8:16 AM  LOS: 1 day

## 2014-10-06 NOTE — Consult Note (Signed)
Consultation  Referring Provider: Doree Barthel MD- [facility] Primary Care Physician:  Imelda Pillow, NP Primary Gastroenterologist:  none  Reason for Consultation:   Melena , coffee ground emesis, nausea/vomiting.  HPI: [name] is a 49 y.o. female  With Bipolar disorder, borderline personality, seizure disorder, and hx of suicide attempt with tylenol OD in Oct 2015 . She developed hepatic failure and was transferred to [facility]. She recovered . Admitted last week with nausea and vomiting,and again yesterday with coffee ground emesis, black stool, abdominal pain. Empty Tylenol bottle found at home- not clear if overt suicide attempt again. Tylenol level was 61. She is on acetylcystine protocol., and parameters all improving  INR 1,2 today. Transaminases were in 400 range- now 1600/400. HGb 13.9 last week down to 10.3 today. No vomiting or BM since admit She denies ASA or Nsaids- says she cannot take because she had an ulcer in the past. Denies active ETOH. Had not been taking any of her meds  because she cannot afford them.   Past Medical History  Diagnosis Date  . Migraines   . Endometriosis   . ETOH abuse     sober 3 1/2 years  . Anorexia   . Depression   . Anxiety   . DJD (degenerative joint disease) of cervical spine   . Seizures   . PICC (peripherally inserted central catheter) in place     rt neck    Past Surgical History  Procedure Laterality Date  . Knee surgery    . Abdominal surgery    . Cholecystectomy    . Appendectomy    . Abdominal hysterectomy    . Nasal sinus surgery    . Laproscopy    . Carpal tunnel release      2010    Prior to Admission medications   Medication Sig Start Date End Date Taking? Authorizing Provider  clonazePAM (KLONOPIN) 1 MG tablet Take 1 tablet (1 mg total) by mouth 3 (three) times daily as needed for anxiety. 10/01/14  Yes Kelvin Cellar, MD  feeding supplement, ENSURE COMPLETE, (ENSURE COMPLETE) LIQD Take 237  mLs by mouth 2 (two) times daily between meals. 10/01/14  Yes Kelvin Cellar, MD  levETIRAcetam (KEPPRA) 500 MG tablet Take 500 mg by mouth 2 (two) times daily.   Yes Historical Provider, MD  Oxcarbazepine (TRILEPTAL) 300 MG tablet Take 1 tablet (300 mg total) by mouth 2 (two) times daily. 10/01/14  Yes Kelvin Cellar, MD    Current Facility-Administered Medications  Medication Dose Route Frequency Provider Last Rate Last Dose  . 0.9 % NaCl with KCl 40 mEq / L  infusion   Intravenous Continuous Delfina Redwood, MD 100 mL/hr at 10/06/14 0643 100 mL/hr at 10/06/14 0643  . acetylcysteine (ACETADOTE) 30,000 mg in dextrose 5 % 750 mL (40 mg/mL) infusion  15 mg/kg/hr Intravenous Continuous Jake Church Masters, RPH 21.3 mL/hr at 10/05/14 2100 15 mg/kg/hr at 10/05/14 2100  . levETIRAcetam (KEPPRA) IVPB 500 mg/100 mL premix  500 mg Intravenous Q12H Delfina Redwood, MD   500 mg at 10/06/14 0837  . ondansetron (ZOFRAN) tablet 4 mg  4 mg Oral Q6H PRN Delfina Redwood, MD       Or  . ondansetron [facility]) injection 4 mg  4 mg Intravenous Q6H PRN Delfina Redwood, MD   4 mg at 10/06/14 0519  . pantoprazole (PROTONIX) 80 mg in sodium chloride 0.9 % 250 mL (0.32 mg/mL) infusion  8 mg/hr Intravenous  Continuous Quintella Reichert, MD 25 mL/hr at 10/06/14 0158 8 mg/hr at 10/06/14 0158  . potassium chloride 10 mEq in 100 mL IVPB  10 mEq Intravenous Q1 Hr x 4 Delfina Redwood, MD   10 mEq at 10/06/14 0939  . sodium chloride 0.9 % injection 10-40 mL  10-40 mL Intracatheter Q12H Quintella Reichert, MD   10 mL at 10/06/14 0945  . sodium chloride 0.9 % injection 10-40 mL  10-40 mL Intracatheter PRN Quintella Reichert, MD        Allergies as of 10/05/2014 - Review Complete 10/05/2014  Allergen Reaction Noted  . Aspirin Anaphylaxis 09/01/2011  . Doxycycline Anaphylaxis 09/01/2011  . Imitrex [sumatriptan base] Other (See Comments) 09/01/2011  . Iodinated diagnostic agents Anaphylaxis 11/04/2013  . Lidocaine Anaphylaxis  09/30/2014  . Prednisone Other (See Comments) 09/01/2011  . Sulfa antibiotics Anaphylaxis 09/01/2011  . Sulfasalazine Anaphylaxis 09/30/2014  . Sumatriptan Other (See Comments) 09/30/2014  . Tramadol Other (See Comments) 04/07/2014  . Levofloxacin Other (See Comments) 09/01/2011  . Latex Rash 09/01/2011  . Levofloxacin Rash and Other (See Comments) 09/30/2014  . Metrizamide Other (See Comments) 09/30/2014  . Nsaids Rash and Other (See Comments) 09/01/2011  . Tolmetin Rash 09/30/2014    Family History  Problem Relation Age of Onset  . Hypertension Mother   . Hyperlipidemia Mother   . Heart failure Father   . Cancer Other     History   Social History  . Marital Status: Divorced    Spouse Name: N/A    Number of Children: N/A  . Years of Education: N/A   Occupational History  . Not on file.   Social History Main Topics  . Smoking status: Current Every Day Smoker -- 0.25 packs/day for 20 years    Types: Cigarettes  . Smokeless tobacco: Never Used  . Alcohol Use: No  . Drug Use: No  . Sexual Activity: Not on file   Other Topics Concern  . Not on file   Social History Narrative    Review of Systems: Pertinent positive and negative review of systems were noted in the above HPI section.  All other review of systems was otherwise negative.  Physical Exam: Vital signs in last 24 hours: Temp:  [97.9 F (36.6 C)-99 F (37.2 C)] 97.9 F (36.6 C) (01/17 0901) Pulse Rate:  [98-130] 105 (01/17 0901) Resp:  [14-27] 20 (01/17 0901) BP: (107-162)/(68-104) 162/93 mmHg (01/17 0901) SpO2:  [88 %-100 %] 97 % (01/17 0901) Weight:  [127 lb 10.3 oz (57.9 kg)] 127 lb 10.3 oz (57.9 kg) (01/16 1953)   General:   Alert,  Well-developed, well-nourished, WF,pleasant and cooperative in NAD Head:  Normocephalic and atraumatic. Eyes:  Sclera clear, no icterus.   Conjunctiva pink. Ears:  Normal auditory acuity. Nose:  No deformity, discharge,  or lesions. Mouth:  No deformity or  lesions.   Neck:  Supple; no masses or thyromegaly. Lungs:  Clear throughout to auscultation.   No wheezes, crackles, or rhonchi. Heart:  Regular rate and rhythm; no murmurs, clicks, rubs,  or gallops. Abdomen:  Soft,tender across upper abdomen, Bs+, no palp HSM, no rebound Rectal:  Deferred  Msk:  Symmetrical without gross deformities. . Pulses:  Normal pulses noted. Extremities:  Without clubbing or edema. Neurologic:  Alert and  oriented x4;  grossly normal neurologically. Skin:  Intact without significant lesions or rashes.. Psych:  Alert and cooperative. Normal mood and affect.  Intake/Output from previous day: 01/16 0701 - 01/17 0700 In: 1564.4 [  I.V.:1464.4; IV Piggyback:100] Out: 930 [Urine:900; Emesis/NG output:30] Intake/Output this shift:    Lab Results:  Recent Labs  10/05/14 0758 10/06/14 0506  WBC 15.0* 9.6  HGB 14.7 10.3*  HCT 41.3 28.2*  PLT 285 272   BMET  Recent Labs  10/05/14 0758 10/06/14 0506  NA 139 144  K 3.2* 2.7*  CL 105 116*  CO2 16* 19  GLUCOSE 90 99  BUN 23 21  CREATININE 1.47* 0.89  CALCIUM 9.8 8.7   LFT  Recent Labs  10/06/14 0506  PROT 4.6*  ALBUMIN 2.8*  AST 425*  ALT 1678*  ALKPHOS 80  BILITOT 2.7*   PT/INR  Recent Labs  10/05/14 0758 10/06/14 0506  LABPROT 17.8* 16.2*  INR 1.45 1.29     IMPRESSION:  #1 49 yo female with acute /subacute Upper GI bleed - R/o  PUD,esophagitis, Mallory weiss tear. #2 anemia secondary to above #3 Tylenol OD - likely intentional  With hx of same in 10 /2015 at which time she had acute hepatic failure #4 prior hx of ETOH abuse #5 Bipolar disorder #6 Seizure disorder #7 Borderline personality #8 S/p Gb, appendectomy, hysterect  PLAN: #1 EGD today with Dr. Deatra Ina #2 IV PPI BID #3 serial hgb's  #4 Fortunately has not developed hepatic failure this time and parameters improved-. Needs psych  inpatient help   [facility]  10/06/2014, 10:11 AM   GI Attending Note   Chart  was reviewed and patient was examined. X-rays and lab were reviewed.    I agree with management and plans. She has had limited GI bleeding most likely secondary to esophagitis, MW tear or active PUD.  She also has acute tylenol toxicity ameliorated by acetyl cysteine.  Hepatic function is stable.  Plan to procede with EGD  Sandy Salaam. Deatra Ina, M.D., [facility] [phone]

## 2014-10-06 NOTE — Progress Notes (Signed)
UR Completed.  [phone]  

## 2014-10-07 ENCOUNTER — Encounter (HOSPITAL_COMMUNITY): Payer: Self-pay | Admitting: Gastroenterology

## 2014-10-07 DIAGNOSIS — F319 Bipolar disorder, unspecified: Secondary | ICD-10-CM

## 2014-10-07 DIAGNOSIS — K264 Chronic or unspecified duodenal ulcer with hemorrhage: Secondary | ICD-10-CM

## 2014-10-07 DIAGNOSIS — K7200 Acute and subacute hepatic failure without coma: Secondary | ICD-10-CM | POA: Insufficient documentation

## 2014-10-07 DIAGNOSIS — D62 Acute posthemorrhagic anemia: Secondary | ICD-10-CM

## 2014-10-07 LAB — CBC
HCT: 19.9 % — ABNORMAL LOW (ref 36.0–46.0)
Hemoglobin: 7 g/dL — ABNORMAL LOW (ref 12.0–15.0)
MCH: 31.4 pg (ref 26.0–34.0)
MCHC: 35.2 g/dL (ref 30.0–36.0)
MCV: 89.2 fL (ref 78.0–100.0)
Platelets: 137 10*3/uL — ABNORMAL LOW (ref 150–400)
RBC: 2.23 MIL/uL — ABNORMAL LOW (ref 3.87–5.11)
RDW: 14 % (ref 11.5–15.5)
WBC: 4.8 10*3/uL (ref 4.0–10.5)

## 2014-10-07 LAB — COMPREHENSIVE METABOLIC PANEL
ALK PHOS: 68 U/L (ref 39–117)
ALT: 941 U/L — ABNORMAL HIGH (ref 0–35)
AST: 127 U/L — ABNORMAL HIGH (ref 0–37)
Albumin: 2.7 g/dL — ABNORMAL LOW (ref 3.5–5.2)
Anion gap: 5 (ref 5–15)
CO2: 28 mmol/L (ref 19–32)
CREATININE: 0.53 mg/dL (ref 0.50–1.10)
Calcium: 8.3 mg/dL — ABNORMAL LOW (ref 8.4–10.5)
Chloride: 107 mEq/L (ref 96–112)
GFR calc Af Amer: >90 mL/min (ref >90)
Glucose, Bld: 111 mg/dL — ABNORMAL HIGH (ref 70–99)
Potassium: 3 mmol/L — ABNORMAL LOW (ref 3.5–5.1)
SODIUM: 140 mmol/L (ref 135–145)
TOTAL PROTEIN: 4.5 g/dL — AB (ref 6.0–8.3)
Total Bilirubin: 1.9 mg/dL — ABNORMAL HIGH (ref 0.3–1.2)

## 2014-10-07 LAB — HEMOGLOBIN AND HEMATOCRIT, BLOOD
HCT: 25.2 % — ABNORMAL LOW (ref 36.0–46.0)
HEMOGLOBIN: 9.1 g/dL — AB (ref 12.0–15.0)

## 2014-10-07 LAB — HIV ANTIBODY (ROUTINE TESTING W REFLEX): HIV-1/HIV-2 Ab: NONREACTIVE

## 2014-10-07 MED ORDER — GUAIFENESIN ER 600 MG PO TB12
600.0000 mg | ORAL_TABLET | Freq: Once | ORAL | Status: AC
  Administered 2014-10-08: 600 mg via ORAL
  Filled 2014-10-07: qty 1

## 2014-10-07 MED ORDER — LEVETIRACETAM 100 MG/ML PO SOLN
500.0000 mg | Freq: Two times a day (BID) | ORAL | Status: DC
  Filled 2014-10-07 (×3): qty 5

## 2014-10-07 MED ORDER — LORAZEPAM 1 MG PO TABS
1.0000 mg | ORAL_TABLET | Freq: Four times a day (QID) | ORAL | Status: DC | PRN
  Administered 2014-10-07 – 2014-10-09 (×3): 1 mg via ORAL
  Filled 2014-10-07 (×3): qty 1

## 2014-10-07 MED ORDER — PANTOPRAZOLE SODIUM 40 MG PO PACK
40.0000 mg | PACK | Freq: Two times a day (BID) | ORAL | Status: DC
  Administered 2014-10-07: 40 mg
  Filled 2014-10-07 (×4): qty 20

## 2014-10-07 MED ORDER — POTASSIUM CHLORIDE 10 MEQ/100ML IV SOLN
10.0000 meq | INTRAVENOUS | Status: AC
  Administered 2014-10-07 (×4): 10 meq via INTRAVENOUS
  Filled 2014-10-07: qty 100

## 2014-10-07 MED ORDER — ALUM & MAG HYDROXIDE-SIMETH 200-200-20 MG/5ML PO SUSP
30.0000 mL | Freq: Four times a day (QID) | ORAL | Status: DC | PRN
  Administered 2014-10-07: 30 mL via ORAL
  Filled 2014-10-07: qty 30

## 2014-10-07 NOTE — Progress Notes (Signed)
Progress Note   Subjective  No specific complaints. Endorses upper abdominal pain.    Objective   Vital signs in last 24 hours: Temp:  [97.8 F (36.6 C)-99.6 F (37.6 C)] 97.8 F (36.6 C) (01/18 0800) Pulse Rate:  [90-115] 95 (01/18 0800) Resp:  [13-24] 22 (01/18 0800) BP: (103-198)/(58-112) 117/64 mmHg (01/18 0800) SpO2:  [92 %-100 %] 96 % (01/18 0800) Weight:  [135 lb 9.3 oz (61.5 kg)] 135 lb 9.3 oz (61.5 kg) (01/18 0423)   General:    white female in NAD Abdomen:  Soft,  Nondistended, mild epigastric tenderness. Normal bowel sounds. Extremities:  Without edema. Neurologic:  Alert and oriented,  grossly normal neurologically. Psych:  Flat affect, no eye contact, angry Lab Results:  Recent Labs  10/05/14 0758 10/06/14 0506 10/07/14 0420 10/07/14 0640  WBC 15.0* 9.6 4.8  --   HGB 14.7 10.3* 7.0* 9.1*  HCT 41.3 28.2* 19.9* 25.2*  PLT 285 272 137*  --    BMET  Recent Labs  10/05/14 0758 10/06/14 0506 10/07/14 0640  NA 139 144 140  K 3.2* 2.7* 3.0*  CL 105 116* 107  CO2 16* 19 28  GLUCOSE 90 99 111*  BUN 23 21 <5*  CREATININE 1.47* 0.89 0.53  CALCIUM 9.8 8.7 8.3*   LFT  Recent Labs  10/07/14 0640  PROT 4.5*  ALBUMIN 2.7*  AST 127*  ALT 941*  ALKPHOS 68  BILITOT 1.9*   PT/INR  Recent Labs  10/05/14 0758 10/06/14 0506  LABPROT 17.8* 16.2*  INR 1.45 1.29    Studies/Results: Ct Abdomen Pelvis Wo Contrast  10/05/2014   CLINICAL DATA:  Acute onset of hematemesis and melena over the past several days. Surgical history includes cholecystectomy, appendectomy and hysterectomy. Current history of depression and alcohol use.  EXAM: CT ABDOMEN AND PELVIS WITHOUT CONTRAST  TECHNIQUE: Multidetector CT imaging of the abdomen and pelvis was performed following the standard protocol without IV contrast.  COMPARISON:  09/30/2014, 01/06/2010, 03/12/2004.  FINDINGS: Severe diffuse hepatic steatosis without focal parenchymal abnormality, allowing for the  unenhanced technique. Normal unenhanced appearance of the spleen, pancreas, right adrenal gland, and both kidneys. Approximate 1.6 x 1.0 cm low-attenuation nodule involving the left adrenal gland, unchanged. Gallbladder surgically absent. No unexpected biliary ductal dilation. Mild iliac atherosclerosis. No significant lymphadenopathy.  Normal appearing stomach and small bowel. Entire colon relatively decompressed with the exception of mild gaseous distention of the sigmoid colon, though no obstructing colonic mass is identified. No ascites. No visible gastric or esophageal varices.  Urinary bladder unremarkable. Uterus surgically absent. No adnexal masses or free pelvic fluid.  Bone window images demonstrate mild degenerative changes at T9-10 with Schmorl's nodes in multiple thoracic vertebrae. Visualized lung bases clear. Heart size normal.  IMPRESSION: 1. No acute abnormality involving the abdomen or pelvis. 2. Severe diffuse hepatic steatosis. 3. Stable left adrenal adenoma.   Electronically Signed   By: Evangeline Dakin M.D.   On: 10/05/2014 15:12      Assessment / Plan:    1. Upper GI bleed secondary to duodenal bulb ulcers. Continue BID PPI x 2 weeks then daily. Awaiting biopsies to rule out H.pylori. She was not taking NSAIDs.  Offered to advance diet but "only if food will not be disgusting"   2. Drug induced liver injury (Tylenol). She is not forthcoming about amount of Tylenol consumed. Hx of Tylenol OD in October.  Transaminases continue to improve. Bilirubin improved at 1.9. Mild prolongation of PT (16.2).  Cannot find Acetaminophen level. Acetylcysteine discontinued yesterday.  3.  hypokalemia, repletion per primary team.  4. Bipolar disorder. Seems to have anger issues.     LOS: 2 days   Tye Savoy  10/07/2014, 10:09 AM  GI ATTENDING  The patient's case was reviewed in morning report with GI colleagues. Interval history and data reviewed. Patient personally seen and examined.  Agree with interval progress note. The patient needs to continue PPI for a minimum of 8 weeks. If aspirin or NSAIDs used in the future, she should coadministered PPI therapy. She needs to avoid Tylenol for obvious reasons. No further recommendations from GI standpoint. We'll sign off  Yvonne Stopher N. Geri Seminole., M.D. [facility] Division of Gastroenterology

## 2014-10-07 NOTE — Progress Notes (Signed)
[facility] PROGRESS NOTE  [name] ZJQ:[id number] DOB: Apr 29, 1966 DOA: 10/05/2014 PCP: Imelda Pillow, NP   Summary 49 yo female with bipolar disorder, borderline personality disorder, eating disorder, polysubstance abuse, multiple previous suicide attempts who presented to the emergency room with epigastric and chest pain, reported coffee-ground emesis and melena. Found to have acute liver injury and eventually admitted to taking multiple Tylenol PM's, though patient would not quantify or discuss further. Started on Protonix, acetylcysteine. GI consulted and found 3 duodenal ulcers, one with stigmata of recent bleed and no evidence of ongoing bleeding. Liver function tests have been improving and acetylcysteine was stopped yesterday per poison control's recommendations. Psychiatry has seen the patient, started Prozac and recommends transfer to inpatient psychiatry once medically stabilized.  Assessment/Plan:  Principal Problem:   Acute liver injury secondary to tylenol toxicity. LFTs, INR improving. Acetylcysteine stopped yesterday per poison control's recommendations. Active Problems:   Intractable Vomiting for >1 week with coffee ground emesis. Appreciate GIs assistance. 3 duodenal ulcers were found. Continue twice a day PPI for 2 weeks then daily. Biopsy pending. If H. pylori found, would treat once tolerating a diet. Has had no vomiting overnight. Will advance to soft. Monitor H&H's.  Currently euvolemic labs all improved. Will stop IV fluids.  Will give medicines in liquid form as able for now then discharge on pills once medically stable. Acute blood loss anemia: Hemoglobin this morning was 7 but repeat was above 9. Continue to trend. No evidence of ongoing bleeding.   Suicidal ideation/probable attempt: Patient was unwilling to talk much with psychiatry yesterday. Patient wants to be back on Trileptal, but Prozac was started as she reported being unable to afford Trileptal.  I asked her to discuss further with psychiatry and she agrees. Will reconsult them to see her today. Continue one-to-one suicide precautions. Anticipate patient will be medically stabilized within a day or 2 to transfer to inpatient psychiatry.   Hypokalemia: Still low. Continue IV repletion.   Bipolar disorder and Borderline personality disorder:  Staff noted some paranoia yesterday. Patient was on benzodiazepines as an outpatient.  She received a dose of IV Ativan and her paranoia has improved. May be an element of benzodiazepine withdrawal.   Acute renal insufficiency secondary to prerenal azotemia:  resolved   Seizure disorder with h/o pseudoseizures per old records. Change Keppra to by mouth.   Hyperammonemia:  No evidence of ongoing encephalopathy. H/o eating disorder per old records History of polysubstance abuse:  Patient requested that I call her 12 step sponsor.  Patient also requested that I call her sister, Raymon Mutton, to give her an update.  I did so, and sister voices concern that patient has overdosed in order to seek drugs.  HPI/Subjective: Feels sad today. Does not recall seeing the psychiatrist yesterday. Upset that she is on Prozac instead of Trileptal, as Prozac has not helped her in the past.  Agrees to speak with psychiatrist today.  Objective: Filed Vitals:   10/07/14 0800  BP: 117/64  Pulse: 95  Temp: 97.8 F (36.6 C)  Resp: 22    Intake/Output Summary (Last 24 hours) at 10/07/14 0950 Last data filed at 10/07/14 0747  Gross per 24 hour  Intake 3276.32 ml  Output   3525 ml  Net -248.68 ml   Filed Weights   10/05/14 0616 10/05/14 1953 10/07/14 0423  Weight: 56.7 kg (125 lb) 57.9 kg (127 lb 10.3 oz) 61.5 kg (135 lb 9.3 oz)    Exam:   General:  Awake. Cooperative.  HEENT: dry MM  Cardiovascular: RRR without MGR   Respiratory: CTA without WRR  Abdomen: S, epigastric tenderness, ND  Ext: no CCE  Psychiatric:  Affect sad.  Cooperative.  Poor eye  contact  Basic Metabolic Panel:  Recent Labs Lab 09/30/14 1649 10/01/14 0521 10/05/14 0758 10/05/14 2222 10/06/14 0506 10/07/14 0640  NA 134* 135 139  --  144 140  K 2.8* 3.6 3.2*  --  2.7* 3.0*  CL 101 103 105  --  116* 107  CO2 23 26 16*  --  19 28  GLUCOSE 124* 97 90  --  99 111*  BUN 13 10 23   --  21 <5*  CREATININE 0.95 0.91 1.47*  --  0.89 0.53  CALCIUM 9.9 9.3 9.8  --  8.7 8.3*  MG  --   --   --  2.5  --   --    Liver Function Tests:  Recent Labs Lab 09/30/14 1649 10/01/14 0521 10/05/14 0758 10/06/14 0506 10/07/14 0640  AST 20 21 2286* 425* 127*  ALT 13 11 4112* 1678* 941*  ALKPHOS 90 69 137* 80 68  BILITOT 0.7 0.6 1.6* 2.7* 1.9*  PROT 7.7 6.7 7.1 4.6* 4.5*  ALBUMIN 5.1 4.2 4.6 2.8* 2.7*    Recent Labs Lab 09/30/14 1649 10/05/14 0758  LIPASE 39 90*    Recent Labs Lab 10/05/14 1430  AMMONIA 78*   CBC:  Recent Labs Lab 09/30/14 1649 10/01/14 0521 10/05/14 0758 10/06/14 0506 10/07/14 0420 10/07/14 0640  WBC 11.9* 15.9* 15.0* 9.6 4.8  --   NEUTROABS 6.2  --  12.5*  --   --   --   HGB 16.6* 13.9 14.7 10.3* 7.0* 9.1*  HCT 45.4 39.3 41.3 28.2* 19.9* 25.2*  MCV 86.6 90.6 87.9 85.7 89.2  --   PLT 411* 352 285 272 137*  --    Cardiac Enzymes: No results for input(s): CKTOTAL, CKMB, CKMBINDEX, TROPONINI in the last 168 hours. BNP (last 3 results) No results for input(s): PROBNP in the last 8760 hours. CBG:  Recent Labs Lab 10/01/14 0556  GLUCAP 138*    Recent Results (from the past 240 hour(s))  MRSA PCR Screening     Status: None   Collection Time: 10/05/14  8:27 PM  Result Value Ref Range Status   MRSA by PCR NEGATIVE NEGATIVE Final    Comment:        The GeneXpert MRSA Assay (FDA approved for NASAL specimens only), is one component of a comprehensive MRSA colonization surveillance program. It is not intended to diagnose MRSA infection nor to guide or monitor treatment for MRSA infections.      Studies: Ct Abdomen Pelvis  Wo Contrast  10/05/2014   CLINICAL DATA:  Acute onset of hematemesis and melena over the past several days. Surgical history includes cholecystectomy, appendectomy and hysterectomy. Current history of depression and alcohol use.  EXAM: CT ABDOMEN AND PELVIS WITHOUT CONTRAST  TECHNIQUE: Multidetector CT imaging of the abdomen and pelvis was performed following the standard protocol without IV contrast.  COMPARISON:  09/30/2014, 01/06/2010, 03/12/2004.  FINDINGS: Severe diffuse hepatic steatosis without focal parenchymal abnormality, allowing for the unenhanced technique. Normal unenhanced appearance of the spleen, pancreas, right adrenal gland, and both kidneys. Approximate 1.6 x 1.0 cm low-attenuation nodule involving the left adrenal gland, unchanged. Gallbladder surgically absent. No unexpected biliary ductal dilation. Mild iliac atherosclerosis. No significant lymphadenopathy.  Normal appearing stomach and small bowel. Entire colon relatively decompressed with the  exception of mild gaseous distention of the sigmoid colon, though no obstructing colonic mass is identified. No ascites. No visible gastric or esophageal varices.  Urinary bladder unremarkable. Uterus surgically absent. No adnexal masses or free pelvic fluid.  Bone window images demonstrate mild degenerative changes at T9-10 with Schmorl's nodes in multiple thoracic vertebrae. Visualized lung bases clear. Heart size normal.  IMPRESSION: 1. No acute abnormality involving the abdomen or pelvis. 2. Severe diffuse hepatic steatosis. 3. Stable left adrenal adenoma.   Electronically Signed   By: Evangeline Dakin M.D.   On: 10/05/2014 15:12   Dg Shoulder Right  10/05/2014   CLINICAL DATA:  Status post fall down steps, with persistent right shoulder pain. Initial encounter.  EXAM: RIGHT SHOULDER - 2+ VIEW  COMPARISON:  Right shoulder radiographs performed 01/06/2010  FINDINGS: There is no evidence of fracture or dislocation. The right humeral head is seated  within the glenoid fossa. Minimal degenerative change is noted at the right acromioclavicular joint. No significant soft tissue abnormalities are seen. The visualized portions of the right lung are clear.  IMPRESSION: No evidence of fracture or dislocation.   Electronically Signed   By: Garald Balding M.D.   On: 10/05/2014 21:54   Dg Shoulder Left  10/05/2014   CLINICAL DATA:  Status post fall, with persistent left shoulder pain. Initial encounter.  EXAM: LEFT SHOULDER - 2+ VIEW  COMPARISON:  None.  FINDINGS: There is no evidence of fracture or dislocation. The left humeral head is seated within the glenoid fossa. The acromioclavicular joint is unremarkable in appearance. No significant soft tissue abnormalities are seen. The visualized portions of the left lung are clear.  IMPRESSION: No evidence of fracture or dislocation.   Electronically Signed   By: Garald Balding M.D.   On: 10/05/2014 21:54    Scheduled Meds: . FLUoxetine  10 mg Oral Daily  . levETIRAcetam  500 mg Intravenous Q12H  . potassium chloride  10 mEq Intravenous Q1 Hr x 4  . sodium chloride  10-40 mL Intracatheter Q12H   Continuous Infusions: . pantoprozole (PROTONIX) infusion 8 mg/hr (10/07/14 0149)    Time spent: 35 minutes  [place] L  [email or web address], password [facility] 10/07/2014, 9:50 AM  LOS: 2 days

## 2014-10-07 NOTE — Progress Notes (Signed)
Paged Dr Conley Canal about pt c/o of headache and epigastric pain; both of which she rates  10/10. New orders received. WIll continue to monitor closely.   Loni Muse, RN

## 2014-10-08 LAB — BASIC METABOLIC PANEL
Anion gap: 11 (ref 5–15)
CHLORIDE: 103 meq/L (ref 96–112)
CO2: 28 mmol/L (ref 19–32)
Calcium: 9.2 mg/dL (ref 8.4–10.5)
Creatinine, Ser: 0.64 mg/dL (ref 0.50–1.10)
GFR calc Af Amer: >90 mL/min (ref >90)
GFR calc non Af Amer: >90 mL/min (ref >90)
Glucose, Bld: 147 mg/dL — ABNORMAL HIGH (ref 70–99)
Potassium: 4.1 mmol/L (ref 3.5–5.1)
Sodium: 142 mmol/L (ref 135–145)

## 2014-10-08 LAB — CBC
HEMATOCRIT: 26 % — AB (ref 36.0–46.0)
HEMOGLOBIN: 9.3 g/dL — AB (ref 12.0–15.0)
MCH: 31.5 pg (ref 26.0–34.0)
MCHC: 35.8 g/dL (ref 30.0–36.0)
MCV: 88.1 fL (ref 78.0–100.0)
Platelets: 142 10*3/uL — ABNORMAL LOW (ref 150–400)
RBC: 2.95 MIL/uL — ABNORMAL LOW (ref 3.87–5.11)
RDW: 13.4 % (ref 11.5–15.5)
WBC: 3.7 10*3/uL — ABNORMAL LOW (ref 4.0–10.5)

## 2014-10-08 LAB — COMPREHENSIVE METABOLIC PANEL
ALK PHOS: 66 U/L (ref 39–117)
ALT: 675 U/L — ABNORMAL HIGH (ref 0–35)
AST: 63 U/L — ABNORMAL HIGH (ref 0–37)
Albumin: 2.9 g/dL — ABNORMAL LOW (ref 3.5–5.2)
Anion gap: 6 (ref 5–15)
BILIRUBIN TOTAL: 1 mg/dL (ref 0.3–1.2)
BUN: <5 mg/dL — ABNORMAL LOW (ref 6–23)
CO2: 33 mmol/L — ABNORMAL HIGH (ref 19–32)
Calcium: 8.8 mg/dL (ref 8.4–10.5)
Chloride: 102 mEq/L (ref 96–112)
Creatinine, Ser: 0.59 mg/dL (ref 0.50–1.10)
GLUCOSE: 142 mg/dL — AB (ref 70–99)
Potassium: 2.9 mmol/L — ABNORMAL LOW (ref 3.5–5.1)
SODIUM: 141 mmol/L (ref 135–145)
Total Protein: 4.8 g/dL — ABNORMAL LOW (ref 6.0–8.3)

## 2014-10-08 LAB — MAGNESIUM: MAGNESIUM: 1.8 mg/dL (ref 1.5–2.5)

## 2014-10-08 LAB — MRSA PCR SCREENING: MRSA by PCR: NEGATIVE

## 2014-10-08 MED ORDER — POTASSIUM CHLORIDE CRYS ER 20 MEQ PO TBCR
40.0000 meq | EXTENDED_RELEASE_TABLET | ORAL | Status: AC
  Administered 2014-10-08 (×3): 40 meq via ORAL
  Filled 2014-10-08 (×3): qty 2

## 2014-10-08 MED ORDER — LEVETIRACETAM 500 MG PO TABS
500.0000 mg | ORAL_TABLET | Freq: Two times a day (BID) | ORAL | Status: DC
  Administered 2014-10-08 – 2014-10-09 (×3): 500 mg via ORAL
  Filled 2014-10-08 (×4): qty 1

## 2014-10-08 MED ORDER — FLUOXETINE HCL 20 MG PO CAPS
20.0000 mg | ORAL_CAPSULE | Freq: Every day | ORAL | Status: DC
  Administered 2014-10-08 – 2014-10-09 (×2): 20 mg via ORAL
  Filled 2014-10-08 (×2): qty 1

## 2014-10-08 MED ORDER — DIPHENHYDRAMINE HCL 25 MG PO CAPS
25.0000 mg | ORAL_CAPSULE | Freq: Four times a day (QID) | ORAL | Status: DC | PRN
  Administered 2014-10-08 – 2014-10-09 (×2): 25 mg via ORAL
  Filled 2014-10-08 (×2): qty 1

## 2014-10-08 MED ORDER — PANTOPRAZOLE SODIUM 40 MG PO TBEC
40.0000 mg | DELAYED_RELEASE_TABLET | Freq: Two times a day (BID) | ORAL | Status: DC
  Administered 2014-10-08 – 2014-10-09 (×3): 40 mg via ORAL
  Filled 2014-10-08 (×3): qty 1

## 2014-10-08 MED ORDER — LORAZEPAM 2 MG/ML IJ SOLN
0.5000 mg | Freq: Once | INTRAMUSCULAR | Status: AC
  Administered 2014-10-08: 0.5 mg via INTRAVENOUS
  Filled 2014-10-08: qty 1

## 2014-10-08 MED ORDER — LORATADINE 10 MG PO TABS
10.0000 mg | ORAL_TABLET | Freq: Every day | ORAL | Status: DC
  Filled 2014-10-08: qty 1

## 2014-10-08 NOTE — Progress Notes (Signed)
[facility] PROGRESS NOTE  [name] GDJ:[id number] DOB: 1965/12/24 DOA: 10/05/2014 PCP: Imelda Pillow, NP   Summary 49 yo female with bipolar disorder, borderline personality disorder, eating disorder, polysubstance abuse, multiple previous suicide attempts who presented to the emergency room with epigastric and chest pain, reported coffee-ground emesis and melena. Found to have acute liver injury and eventually admitted to taking multiple Tylenol PM's, though patient would not quantify or discuss further. Started on Protonix, acetylcysteine. GI consulted and found 3 duodenal ulcers, one with stigmata of recent bleed and no evidence of ongoing bleeding. Liver function tests have been improving and acetylcysteine was stopped yesterday per poison control's recommendations. Psychiatry has seen the patient, started Prozac and recommends transfer to inpatient psychiatry once medically stabilized.  Assessment/Plan:     Acute liver injury secondary to tylenol toxicity. LFTs, INR improving. Acetylcysteine stopped per poison control's recommendations.    Intractable Vomiting for >1 week with coffee ground emesis. Appreciate GIs assistance. 3 duodenal ulcers were found. Continue twice a day PPI for 2 weeks then daily. Biopsy pending. If H. pylori found, would treat once tolerating a diet. Has had no vomiting overnight. Will advance to soft. Monitor H&H's.  Currently euvolemic labs all improved. Will stop IV fluids.  Will give medicines in liquid form as able for now then discharge on pills once medically stable. Acute blood loss anemia: Hemoglobin this morning was 7 but repeat was above 9. Continue to trend. No evidence of ongoing bleeding.   Suicidal ideation/probable attempt:  inpatient psychiatry Suspect tomm   Hypokalemia: Still low. Replete and recheck   Bipolar disorder and Borderline personality disorder:    Patient was on benzodiazepines as an outpatient.  She received a dose of  IV Ativan and her paranoia has improved. May be an element of benzodiazepine withdrawal.   Acute renal insufficiency secondary to prerenal azotemia:  resolved   Seizure disorder with h/o pseudoseizures per old records. Change Keppra to by mouth.   Hyperammonemia:  No evidence of ongoing encephalopathy. H/o eating disorder per old records History of polysubstance abuse:  Patient requested [name] call her 12 step sponsor.  Patient also requested that Hailey. Conley [name] call her sister, [name], to give her an update. sister voices concern that patient has overdosed in order to seek drugs.  HPI/Subjective: C/o itching eyes overnight  Objective: Filed Vitals:   10/08/14 0457  BP: 133/77  Pulse: 100  Temp: 98.5 F (36.9 C)  Resp: 20    Intake/Output Summary (Last 24 hours) at 10/08/14 0840 Last data filed at 10/08/14 0549  Gross per 24 hour  Intake   3599 ml  Output   6000 ml  Net  -2401 ml   Filed Weights   10/05/14 0616 10/05/14 1953 10/07/14 0423  Weight: 56.7 kg (125 lb) 57.9 kg (127 lb 10.3 oz) 61.5 kg (135 lb 9.3 oz)    Exam:   General:  Awake. Cooperative.- sitter at bedside  HEENT: dry MM  Cardiovascular: RRR without MGR   Respiratory: CTA without WRR  Abdomen: +BS, soft  Ext: no CCE   Basic Metabolic Panel:  Recent Labs Lab 10/05/14 0758 10/05/14 2222 10/06/14 0506 10/07/14 0640 10/08/14 0338  NA 139  --  144 140 141  K 3.2*  --  2.7* 3.0* 2.9*  CL 105  --  116* 107 102  CO2 16*  --  19 28 33*  GLUCOSE 90  --  99 111* 142*  BUN 23  --  21 <  5* <5*  CREATININE 1.47*  --  0.89 0.53 0.59  CALCIUM 9.8  --  8.7 8.3* 8.8  MG  --  2.5  --   --   --    Liver Function Tests:  Recent Labs Lab 10/05/14 0758 10/06/14 0506 10/07/14 0640 10/08/14 0338  AST 2286* 425* 127* 63*  ALT 4112* 1678* 941* 675*  ALKPHOS 137* 80 68 66  BILITOT 1.6* 2.7* 1.9* 1.0  PROT 7.1 4.6* 4.5* 4.8*  ALBUMIN 4.6 2.8* 2.7* 2.9*    Recent Labs Lab 10/05/14 0758  LIPASE  90*    Recent Labs Lab 10/05/14 1430  AMMONIA 78*   CBC:  Recent Labs Lab 10/05/14 0758 10/06/14 0506 10/07/14 0420 10/07/14 0640 10/08/14 0338  WBC 15.0* 9.6 4.8  --  3.7*  NEUTROABS 12.5*  --   --   --   --   HGB 14.7 10.3* 7.0* 9.1* 9.3*  HCT 41.3 28.2* 19.9* 25.2* 26.0*  MCV 87.9 85.7 89.2  --  88.1  PLT 285 272 137*  --  142*   Cardiac Enzymes: No results for input(s): CKTOTAL, CKMB, CKMBINDEX, TROPONINI in the last 168 hours. BNP (last 3 results) No results for input(s): PROBNP in the last 8760 hours. CBG: No results for input(s): GLUCAP in the last 168 hours.  Recent Results (from the past 240 hour(s))  MRSA PCR Screening     Status: None   Collection Time: 10/05/14  8:27 PM  Result Value Ref Range Status   MRSA by PCR NEGATIVE NEGATIVE Final    Comment:        The GeneXpert MRSA Assay (FDA approved for NASAL specimens only), is one component of a comprehensive MRSA colonization surveillance program. It is not intended to diagnose MRSA infection nor to guide or monitor treatment for MRSA infections.   MRSA PCR Screening     Status: None   Collection Time: 10/07/14 11:06 PM  Result Value Ref Range Status   MRSA by PCR NEGATIVE NEGATIVE Final    Comment:        The GeneXpert MRSA Assay (FDA approved for NASAL specimens only), is one component of a comprehensive MRSA colonization surveillance program. It is not intended to diagnose MRSA infection nor to guide or monitor treatment for MRSA infections.      Studies: No results found.  Scheduled Meds: . FLUoxetine  10 mg Oral Daily  . levETIRAcetam  500 mg Oral BID  . pantoprazole sodium  40 mg Per Tube BID  . potassium chloride  40 mEq Oral Q4H  . sodium chloride  10-40 mL Intracatheter Q12H   Continuous Infusions:    Time spent: 25 minutes  [name]  [email or web address], password [email or web address] 10/08/2014, 8:40 AM  LOS: 3 days

## 2014-10-08 NOTE — Consult Note (Signed)
Psychiatry Consult Follow Up  Reason for Consult:  Recurrent overdoses, depression Referring Physician:  Dr. Gary Fleet is an 49 y.o. female. Total Time spent with patient: 30 minutes  Assessment: AXIS I:  Major Depression, Recurrent severe AXIS II:  Cluster B Traits AXIS III:   Past Medical History  Diagnosis Date  . Migraines   . Endometriosis   . ETOH abuse     sober 3 1/2 years  . Anorexia   . Depression   . Anxiety   . DJD (degenerative joint disease) of cervical spine   . Seizures   . PICC (peripherally inserted central catheter) in place     rt neck   AXIS IV:  economic problems, problems related to social environment and problems with primary support group AXIS V:  1-10 persistent dangerousness to self and others present  Plan:  Recommend psychiatric Inpatient admission when medically cleared.  Subjective:   [name] is a 49 y.o. female patient admitted with coffee ground emesis and black stools.  HPI: This patient is a 49 year old single white female who lives alone in [place]. She works as a Cytogeneticist. The patient was admitted after presenting to the ED complaining of coffee-ground emesis and black stools, burning chest pain and suicidal ideation. She admitted to primary M.D. that she took several Tylenol PM's over the past week claiming she wanted to sleep. A family member found empty Tylenol bottle in her house and emesis containing pill fragments. At present she is very irritable and refuses to answer questions directly. She does admit that she no longer wants to live because "my family doesn't want me here". She's unable to contract for safety but will not say whether or not she would harm herself if allowed to leave the hospital in the future. The patient has past psychiatric treatment with Dr. Sabra Heck but has not been able to afford any of her psychiatric her seizure medications because she has no health insurance.  The patient cut the assessment short because she didn't want to be questioned any further and  became angry. She does deny auditory or visual hallucinations paranoid ideation or substance abuse. Drug screen is positive for benzodiazepines.  Interval History: patient seen and chart reviewed. Patient is known from her previous hospital encounter for the similar emotional symptoms. Patient stated that she started feeling better and wants staff care for her when she was having hard time due to chronic acetaminophen misuse vs abuse and presented with extremely elevated liver enzymes. Patient stated that she had a acetaminophen overdose in October 2015 and was admitted to [facility], where she was considered for liver transplantation but later clear for no transplantation. She continue to endorse symptoms of depression, suicide ideation and not able to contract for safety. She meets criteria for in patient psych admission for safety monitoring when medically stable.   She is a 49 yo female with bipolar disorder, borderline personality disorder, eating disorder, polysubstance abuse, multiple previous suicide attempts who presented to the emergency room with epigastric and chest pain, reported coffee-ground emesis and melena. Found to have acute liver injury and eventually admitted to taking multiple Tylenol PM's, though patient would not quantify or discuss further. Started on Protonix, acetylcysteine. GI consulted and found 3 duodenal ulcers, one with stigmata of recent bleed and no evidence of ongoing bleeding. Liver function tests have been improving and acetylcysteine was stopped yesterday per poison control's recommendations. Psychiatry has seen  the patient, started Prozac and recommends transfer to inpatient psychiatry once medically stabilized   Past Psychiatric History: Past Medical History  Diagnosis Date  . Migraines   . Endometriosis   . ETOH abuse     sober 3 1/2 years  . Anorexia    . Depression   . Anxiety   . DJD (degenerative joint disease) of cervical spine   . Seizures   . PICC (peripherally inserted central catheter) in place     rt neck    reports that she has been smoking Cigarettes.  She has a 5 pack-year smoking history. She has never used smokeless tobacco. She reports that she does not drink alcohol or use illicit drugs. Family History  Problem Relation Age of Onset  . Hypertension Mother   . Hyperlipidemia Mother   . Heart failure Father   . Cancer Other      Living Arrangements: Alone   Abuse/Neglect [facility]) Physical Abuse: Denies Verbal Abuse: Denies Sexual Abuse: Denies Allergies:   Allergies  Allergen Reactions  . Aspirin Anaphylaxis  . Doxycycline Anaphylaxis  . Imitrex [Sumatriptan Base] Other (See Comments)    Severe hypertension  . Iodinated Diagnostic Agents Anaphylaxis  . Lidocaine Anaphylaxis    Pt states she does well with Marcaine/Bupivicaine without problem  . Prednisone Other (See Comments)    She goes crazy  . Sulfa Antibiotics Anaphylaxis  . Sulfasalazine Anaphylaxis  . Sumatriptan Other (See Comments)    Felt like she was "having a stroke", heart rate and blood pressure "went through the roof"  . Tramadol Other (See Comments)    seizures  . Levofloxacin Other (See Comments)    Joints hurt  . Latex Rash  . Levofloxacin Rash and Other (See Comments)    Joints hurt  . Metrizamide Other (See Comments)  . Nsaids Rash and Other (See Comments)    GI bleed  . Tolmetin Rash     Objective: Blood pressure 133/77, pulse 100, temperature 98.5 F (36.9 C), temperature source Oral, resp. rate 20, height '5\' 2"'  (1.575 m), weight 61.5 kg (135 lb 9.3 oz), SpO2 98 %.Body mass index is 24.79 kg/(m^2). Results for orders placed or performed during the hospital encounter of 10/05/14 (from the past 72 hour(s))  Acetaminophen level     Status: None   Collection Time: 10/05/14 12:28 PM  Result Value Ref Range   Acetaminophen  (Tylenol), Serum 27.0 10 - 30 ug/mL    Comment:        THERAPEUTIC CONCENTRATIONS VARY SIGNIFICANTLY. A RANGE OF 10-30 ug/mL MAY BE AN EFFECTIVE CONCENTRATION FOR MANY PATIENTS. HOWEVER, SOME ARE BEST TREATED AT CONCENTRATIONS OUTSIDE THIS RANGE. ACETAMINOPHEN CONCENTRATIONS >150 ug/mL AT 4 HOURS AFTER INGESTION AND >50 ug/mL AT 12 HOURS AFTER INGESTION ARE OFTEN ASSOCIATED WITH TOXIC REACTIONS.   Pregnancy, urine     Status: None   Collection Time: 10/05/14  1:27 PM  Result Value Ref Range   Preg Test, Ur NEGATIVE NEGATIVE    Comment:        THE SENSITIVITY OF THIS METHODOLOGY IS >20 mIU/mL.   I-Stat CG4 Lactic Acid, ED     Status: None   Collection Time: 10/05/14  2:02 PM  Result Value Ref Range   Lactic Acid, Venous 1.00 0.5 - 2.2 mmol/L  I-Stat venous blood gas, ED     Status: Abnormal   Collection Time: 10/05/14  2:02 PM  Result Value Ref Range   pH, Ven 7.364 (H) 7.250 - 7.300  pCO2, Ven 28.1 (L) 45.0 - 50.0 mmHg   pO2, Ven 72.0 (H) 30.0 - 45.0 mmHg   Bicarbonate 16.0 (L) 20.0 - 24.0 mEq/L   TCO2 17 0 - 100 mmol/L   O2 Saturation 94.0 %   Acid-base deficit 8.0 (H) 0.0 - 2.0 mmol/L   Sample type VENOUS   Ammonia     Status: Abnormal   Collection Time: 10/05/14  2:30 PM  Result Value Ref Range   Ammonia 78 (H) 11 - 32 umol/L    Comment: Please note change in reference range.  MRSA PCR Screening     Status: None   Collection Time: 10/05/14  8:27 PM  Result Value Ref Range   MRSA by PCR NEGATIVE NEGATIVE    Comment:        The GeneXpert MRSA Assay (FDA approved for NASAL specimens only), is one component of a comprehensive MRSA colonization surveillance program. It is not intended to diagnose MRSA infection nor to guide or monitor treatment for MRSA infections.   Ethanol     Status: None   Collection Time: 10/05/14 10:22 PM  Result Value Ref Range   Alcohol, Ethyl (B) <5 0 - 9 mg/dL    Comment:        LOWEST DETECTABLE LIMIT FOR SERUM ALCOHOL IS 11  mg/dL FOR MEDICAL PURPOSES ONLY REPEATED TO VERIFY   HIV antibody     Status: None   Collection Time: 10/05/14 10:22 PM  Result Value Ref Range   HIV 1/O/2 Abs-Index Value <1.00 <1.00    Comment: Index Value: Specimen reactivity relative to the negative cutoff.   HIV-1/HIV-2 Ab Non Reactive Non Reactive    Comment: (NOTE) Performed At: [facility] [place], [place] [id number] Lindon Romp MD AV:[id number]   Hepatitis panel, acute     Status: None   Collection Time: 10/05/14 10:22 PM  Result Value Ref Range   Hepatitis B Surface Ag NEGATIVE NEGATIVE   HCV Ab NEGATIVE NEGATIVE   Hep A IgM NON REACTIVE NON REACTIVE    Comment: (NOTE) Effective August 05, 2014, Hepatitis Acute Panel (test code [phone]) will be revised to automatically reflex to the Hepatitis C Viral RNA, Quantitative, Real-Time PCR assay if the Hepatitis C antibody screening result is Reactive. This action is being taken to ensure that the CDC/USPSTF recommended HCV diagnostic algorithm with the appropriate test reflex needed for accurate interpretation is followed.    Hep B C IgM NON REACTIVE NON REACTIVE    Comment: (NOTE) High levels of Hepatitis B Core IgM antibody are detectable during the acute stage of Hepatitis B. This antibody is used to differentiate current from past HBV infection. Performed at [facility]   Magnesium     Status: None   Collection Time: 10/05/14 10:22 PM  Result Value Ref Range   Magnesium 2.5 1.5 - 2.5 mg/dL  Urine rapid drug screen (hosp performed)     Status: Abnormal   Collection Time: 10/05/14 10:26 PM  Result Value Ref Range   Opiates NONE DETECTED NONE DETECTED   Cocaine NONE DETECTED NONE DETECTED   Benzodiazepines POSITIVE (A) NONE DETECTED   Amphetamines NONE DETECTED NONE DETECTED   Tetrahydrocannabinol NONE DETECTED NONE DETECTED   Barbiturates NONE DETECTED NONE DETECTED    Comment:        DRUG SCREEN FOR MEDICAL  PURPOSES ONLY.  IF CONFIRMATION IS NEEDED FOR ANY PURPOSE, NOTIFY LAB WITHIN 5 DAYS.  LOWEST DETECTABLE LIMITS FOR URINE DRUG SCREEN Drug Class       Cutoff (ng/mL) Amphetamine      1000 Barbiturate      200 Benzodiazepine   888 Tricyclics       916 Opiates          300 Cocaine          300 THC              50   Comprehensive metabolic panel     Status: Abnormal   Collection Time: 10/06/14  5:06 AM  Result Value Ref Range   Sodium 144 135 - 145 mmol/L    Comment: Please note change in reference range.   Potassium 2.7 (LL) 3.5 - 5.1 mmol/L    Comment: Please note change in reference range. REPEATED TO VERIFY CRITICAL RESULT CALLED TO, READ BACK BY AND VERIFIED WITH: ENNISSRN [id number] MCCAULEG    Chloride 116 (H) 96 - 112 mEq/L   CO2 19 19 - 32 mmol/L   Glucose, Bld 99 70 - 99 mg/dL   BUN 21 6 - 23 mg/dL   Creatinine, Ser 0.89 0.50 - 1.10 mg/dL   Calcium 8.7 8.4 - 10.5 mg/dL   Total Protein 4.6 (L) 6.0 - 8.3 g/dL   Albumin 2.8 (L) 3.5 - 5.2 g/dL   AST 425 (H) 0 - 37 U/L   ALT 1678 (H) 0 - 35 U/L   Alkaline Phosphatase 80 39 - 117 U/L   Total Bilirubin 2.7 (H) 0.3 - 1.2 mg/dL   GFR calc non Af Amer 75 (L) >90 mL/min   GFR calc Af Amer 87 (L) >90 mL/min    Comment: (NOTE) The eGFR has been calculated using the CKD EPI equation. This calculation has not been validated in all clinical situations. eGFR's persistently <90 mL/min signify possible Chronic Kidney Disease.    Anion gap 9 5 - 15  CBC     Status: Abnormal   Collection Time: 10/06/14  5:06 AM  Result Value Ref Range   WBC 9.6 4.0 - 10.5 K/uL   RBC 3.29 (L) 3.87 - 5.11 MIL/uL   Hemoglobin 10.3 (L) 12.0 - 15.0 g/dL    Comment: DELTA CHECK NOTED REPEATED TO VERIFY    HCT 28.2 (L) 36.0 - 46.0 %   MCV 85.7 78.0 - 100.0 fL   MCH 31.3 26.0 - 34.0 pg   MCHC 36.5 (H) 30.0 - 36.0 g/dL   RDW 14.1 11.5 - 15.5 %   Platelets 272 150 - 400 K/uL  Protime-INR     Status: Abnormal   Collection Time: 10/06/14   5:06 AM  Result Value Ref Range   Prothrombin Time 16.2 (H) 11.6 - 15.2 seconds   INR 1.29 0.00 - 1.49  APTT     Status: None   Collection Time: 10/06/14  5:06 AM  Result Value Ref Range   aPTT 31 24 - 37 seconds  Acetaminophen level     Status: Abnormal   Collection Time: 10/06/14  5:06 AM  Result Value Ref Range   Acetaminophen (Tylenol), Serum <10.0 (L) 10 - 30 ug/mL    Comment:        THERAPEUTIC CONCENTRATIONS VARY SIGNIFICANTLY. A RANGE OF 10-30 ug/mL MAY BE AN EFFECTIVE CONCENTRATION FOR MANY PATIENTS. HOWEVER, SOME ARE BEST TREATED AT CONCENTRATIONS OUTSIDE THIS RANGE. ACETAMINOPHEN CONCENTRATIONS >150 ug/mL AT 4 HOURS AFTER INGESTION AND >50 ug/mL AT 12 HOURS AFTER INGESTION ARE OFTEN ASSOCIATED WITH TOXIC REACTIONS.  CBC     Status: Abnormal   Collection Time: 10/07/14  4:20 AM  Result Value Ref Range   WBC 4.8 4.0 - 10.5 K/uL   RBC 2.23 (L) 3.87 - 5.11 MIL/uL   Hemoglobin 7.0 (L) 12.0 - 15.0 g/dL    Comment: DELTA CHECK NOTED REPEATED TO VERIFY    HCT 19.9 (L) 36.0 - 46.0 %   MCV 89.2 78.0 - 100.0 fL   MCH 31.4 26.0 - 34.0 pg   MCHC 35.2 30.0 - 36.0 g/dL   RDW 14.0 11.5 - 15.5 %   Platelets 137 (L) 150 - 400 K/uL    Comment: DELTA CHECK NOTED REPEATED TO VERIFY   Comprehensive metabolic panel     Status: Abnormal   Collection Time: 10/07/14  6:40 AM  Result Value Ref Range   Sodium 140 135 - 145 mmol/L    Comment: Please note change in reference range.   Potassium 3.0 (L) 3.5 - 5.1 mmol/L    Comment: Please note change in reference range.   Chloride 107 96 - 112 mEq/L   CO2 28 19 - 32 mmol/L   Glucose, Bld 111 (H) 70 - 99 mg/dL   BUN <5 (L) 6 - 23 mg/dL    Comment: RESULT REPEATED AND VERIFIED   Creatinine, Ser 0.53 0.50 - 1.10 mg/dL    Comment: RESULT REPEATED AND VERIFIED   Calcium 8.3 (L) 8.4 - 10.5 mg/dL   Total Protein 4.5 (L) 6.0 - 8.3 g/dL   Albumin 2.7 (L) 3.5 - 5.2 g/dL   AST 127 (H) 0 - 37 U/L   ALT 941 (H) 0 - 35 U/L   Alkaline  Phosphatase 68 39 - 117 U/L   Total Bilirubin 1.9 (H) 0.3 - 1.2 mg/dL   GFR calc non Af Amer >90 >90 mL/min   GFR calc Af Amer >90 >90 mL/min    Comment: (NOTE) The eGFR has been calculated using the CKD EPI equation. This calculation has not been validated in all clinical situations. eGFR's persistently <90 mL/min signify possible Chronic Kidney Disease.    Anion gap 5 5 - 15  Hemoglobin and hematocrit, blood     Status: Abnormal   Collection Time: 10/07/14  6:40 AM  Result Value Ref Range   Hemoglobin 9.1 (L) 12.0 - 15.0 g/dL    Comment: REPEATED TO VERIFY   HCT 25.2 (L) 36.0 - 46.0 %  MRSA PCR Screening     Status: None   Collection Time: 10/07/14 11:06 PM  Result Value Ref Range   MRSA by PCR NEGATIVE NEGATIVE    Comment:        The GeneXpert MRSA Assay (FDA approved for NASAL specimens only), is one component of a comprehensive MRSA colonization surveillance program. It is not intended to diagnose MRSA infection nor to guide or monitor treatment for MRSA infections.   Comprehensive metabolic panel     Status: Abnormal   Collection Time: 10/08/14  3:38 AM  Result Value Ref Range   Sodium 141 135 - 145 mmol/L    Comment: Please note change in reference range.   Potassium 2.9 (L) 3.5 - 5.1 mmol/L    Comment: Please note change in reference range.   Chloride 102 96 - 112 mEq/L   CO2 33 (H) 19 - 32 mmol/L   Glucose, Bld 142 (H) 70 - 99 mg/dL   BUN <5 (L) 6 - 23 mg/dL    Comment: REPEATED TO VERIFY   Creatinine, Ser  0.59 0.50 - 1.10 mg/dL   Calcium 8.8 8.4 - 10.5 mg/dL   Total Protein 4.8 (L) 6.0 - 8.3 g/dL   Albumin 2.9 (L) 3.5 - 5.2 g/dL   AST 63 (H) 0 - 37 U/L   ALT 675 (H) 0 - 35 U/L   Alkaline Phosphatase 66 39 - 117 U/L   Total Bilirubin 1.0 0.3 - 1.2 mg/dL   GFR calc non Af Amer >90 >90 mL/min   GFR calc Af Amer >90 >90 mL/min    Comment: (NOTE) The eGFR has been calculated using the CKD EPI equation. This calculation has not been validated in all  clinical situations. eGFR's persistently <90 mL/min signify possible Chronic Kidney Disease.    Anion gap 6 5 - 15  CBC     Status: Abnormal   Collection Time: 10/08/14  3:38 AM  Result Value Ref Range   WBC 3.7 (L) 4.0 - 10.5 K/uL   RBC 2.95 (L) 3.87 - 5.11 MIL/uL   Hemoglobin 9.3 (L) 12.0 - 15.0 g/dL   HCT 26.0 (L) 36.0 - 46.0 %   MCV 88.1 78.0 - 100.0 fL   MCH 31.5 26.0 - 34.0 pg   MCHC 35.8 30.0 - 36.0 g/dL   RDW 13.4 11.5 - 15.5 %   Platelets 142 (L) 150 - 400 K/uL  Magnesium     Status: None   Collection Time: 10/08/14  3:38 AM  Result Value Ref Range   Magnesium 1.8 1.5 - 2.5 mg/dL   Labs are reviewed and are pertinent for acute liver failure  Current Facility-Administered Medications  Medication Dose Route Frequency Provider Last Rate Last Dose  . alum & mag hydroxide-simeth (MAALOX/MYLANTA) 200-200-20 MG/5ML suspension 30 mL  30 mL Oral Q6H PRN Delfina Redwood, MD   30 mL at 10/07/14 1825  . FLUoxetine (PROZAC) 20 MG/5ML solution 10 mg  10 mg Oral Daily Levonne Spiller, MD   10 mg at 10/07/14 0906  . levETIRAcetam (KEPPRA) tablet 500 mg  500 mg Oral BID Geradine Girt, DO   500 mg at 10/08/14 1039  . LORazepam (ATIVAN) tablet 1 mg  1 mg Oral Q6H PRN Delfina Redwood, MD   1 mg at 10/08/14 0139  . ondansetron (ZOFRAN) tablet 4 mg  4 mg Oral Q6H PRN Delfina Redwood, MD       Or  . ondansetron [facility]) injection 4 mg  4 mg Intravenous Q6H PRN Delfina Redwood, MD   4 mg at 10/06/14 0519  . pantoprazole (PROTONIX) EC tablet 40 mg  40 mg Oral BID Geradine Girt, DO   40 mg at 10/08/14 1039  . potassium chloride SA (K-DUR,KLOR-CON) CR tablet 40 mEq  40 mEq Oral Q4H Jessica U Vann, DO   40 mEq at 10/08/14 0920  . sodium chloride 0.9 % injection 10-40 mL  10-40 mL Intracatheter Q12H Quintella Reichert, MD   10 mL at 10/06/14 2027  . sodium chloride 0.9 % injection 10-40 mL  10-40 mL Intracatheter PRN Quintella Reichert, MD   10 mL at 10/08/14 0017    Psychiatric Specialty  Exam:     Blood pressure 133/77, pulse 100, temperature 98.5 F (36.9 C), temperature source Oral, resp. rate 20, height '5\' 2"'  (1.575 m), weight 61.5 kg (135 lb 9.3 oz), SpO2 98 %.Body mass index is 24.79 kg/(m^2).  General Appearance: Fairly Groomed and Guarded  Engineer, water::  Minimal  Speech:  Slow  Volume:  Decreased  Mood:  Angry, Anxious and Hopeless  Affect:  Constricted, Depressed, Flat and Inappropriate  Thought Process:  Loose  Orientation:  Full (Time, Place, and Person)  Thought Content:  Rumination  Suicidal Thoughts:  Yes.  with intent/plan  Homicidal Thoughts:  No  Memory:  Immediate;   Poor Recent;   Poor Remote;   Poor  Judgement:  Impaired  Insight:  Lacking  Psychomotor Activity:  Decreased  Concentration:  Fair  Recall:  Poor  Fund of Knowledge:Poor  Language: Fair  Akathisia:  No  Handed:  Right  AIMS (if indicated):     Assets:  Communication Skills  Sleep:      Musculoskeletal: Strength & Muscle Tone: within normal limits Gait & Station: unsteady Patient leans: N/A  Treatment Plan Summary: Daily contact with patient to assess and evaluate symptoms and progress in treatment Medication management  Continue Prozac oral solution carefully as her liver functions are impaired.   [place] Vuncannon,JANARDHAHA R. 10/08/2014 10:51 AM

## 2014-10-08 NOTE — Progress Notes (Addendum)
On call NP Baltazar Najjar paged again per pt's further complaints of anxiety and agitation. One time dose IV ativan ordered.

## 2014-10-08 NOTE — Progress Notes (Signed)
On call NP Baltazar Najjar paged per pt request. Pt state anxiety worse. Pt state eyes itchy and watery.

## 2014-10-08 NOTE — Clinical Social Work Psych Note (Signed)
Psych CSW notified of inpatient psychiatric hospitalization recommendation.  Psych CSW will assist with psychiatric placement once medically cleared.  Nonnie Done, [place] [phone]  Psychiatric & Orthopedics (5N 1-16) Clinical Social Worker

## 2014-10-09 ENCOUNTER — Inpatient Hospital Stay (HOSPITAL_COMMUNITY)
Admission: AD | Admit: 2014-10-09 | Discharge: 2014-10-19 | DRG: 885 | Disposition: A | Discharge status: Home / Self Care | Payer: Federal, State, Local not specified - Other | Source: Intra-hospital | Attending: Psychiatry | Admitting: Psychiatry

## 2014-10-09 ENCOUNTER — Encounter (HOSPITAL_COMMUNITY): Payer: Self-pay

## 2014-10-09 DIAGNOSIS — F3163 Bipolar disorder, current episode mixed, severe, without psychotic features: Secondary | ICD-10-CM | POA: Diagnosis present

## 2014-10-09 DIAGNOSIS — F411 Generalized anxiety disorder: Secondary | ICD-10-CM | POA: Diagnosis present

## 2014-10-09 DIAGNOSIS — K219 Gastro-esophageal reflux disease without esophagitis: Secondary | ICD-10-CM | POA: Diagnosis present

## 2014-10-09 DIAGNOSIS — F17210 Nicotine dependence, cigarettes, uncomplicated: Secondary | ICD-10-CM | POA: Diagnosis present

## 2014-10-09 DIAGNOSIS — Z8249 Family history of ischemic heart disease and other diseases of the circulatory system: Secondary | ICD-10-CM | POA: Diagnosis not present

## 2014-10-09 DIAGNOSIS — G8929 Other chronic pain: Secondary | ICD-10-CM | POA: Diagnosis present

## 2014-10-09 DIAGNOSIS — G4700 Insomnia, unspecified: Secondary | ICD-10-CM | POA: Diagnosis present

## 2014-10-09 DIAGNOSIS — G894 Chronic pain syndrome: Secondary | ICD-10-CM | POA: Insufficient documentation

## 2014-10-09 DIAGNOSIS — F332 Major depressive disorder, recurrent severe without psychotic features: Secondary | ICD-10-CM | POA: Insufficient documentation

## 2014-10-09 DIAGNOSIS — F329 Major depressive disorder, single episode, unspecified: Secondary | ICD-10-CM | POA: Diagnosis present

## 2014-10-09 DIAGNOSIS — M47812 Spondylosis without myelopathy or radiculopathy, cervical region: Secondary | ICD-10-CM | POA: Diagnosis present

## 2014-10-09 DIAGNOSIS — R51 Headache: Secondary | ICD-10-CM | POA: Diagnosis present

## 2014-10-09 LAB — BASIC METABOLIC PANEL
Anion gap: 0 — ABNORMAL LOW (ref 5–15)
BUN: <5 mg/dL — ABNORMAL LOW (ref 6–23)
CO2: 36 mmol/L — ABNORMAL HIGH (ref 19–32)
Calcium: 9.2 mg/dL (ref 8.4–10.5)
Chloride: 106 mEq/L (ref 96–112)
Creatinine, Ser: 0.59 mg/dL (ref 0.50–1.10)
GFR calc Af Amer: >90 mL/min (ref >90)
Glucose, Bld: 95 mg/dL (ref 70–99)
POTASSIUM: 4.2 mmol/L (ref 3.5–5.1)
SODIUM: 142 mmol/L (ref 135–145)

## 2014-10-09 LAB — CBC
HCT: 27.8 % — ABNORMAL LOW (ref 36.0–46.0)
HEMOGLOBIN: 9.6 g/dL — AB (ref 12.0–15.0)
MCH: 30.9 pg (ref 26.0–34.0)
MCHC: 34.5 g/dL (ref 30.0–36.0)
MCV: 89.4 fL (ref 78.0–100.0)
Platelets: 125 10*3/uL — ABNORMAL LOW (ref 150–400)
RBC: 3.11 MIL/uL — AB (ref 3.87–5.11)
RDW: 13.1 % (ref 11.5–15.5)
WBC: 4 10*3/uL (ref 4.0–10.5)

## 2014-10-09 MED ORDER — FLUOXETINE HCL 20 MG PO CAPS: 20.0000 mg | ORAL_CAPSULE | Freq: Every day | ORAL | Status: DC

## 2014-10-09 MED ORDER — MAGNESIUM HYDROXIDE 400 MG/5ML PO SUSP: 30.0000 mL | Freq: Every day | ORAL | Status: DC | PRN

## 2014-10-09 MED ORDER — PANTOPRAZOLE SODIUM 40 MG PO TBEC: 40.0000 mg | DELAYED_RELEASE_TABLET | Freq: Two times a day (BID) | ORAL | Status: DC

## 2014-10-09 MED ORDER — ENSURE COMPLETE PO LIQD: 237.0000 mL | Freq: Two times a day (BID) | ORAL | Status: DC

## 2014-10-09 MED ORDER — DIPHENHYDRAMINE HCL 25 MG PO CAPS
25.0000 mg | ORAL_CAPSULE | Freq: Four times a day (QID) | ORAL | Status: DC | PRN
  Administered 2014-10-09 – 2014-10-13 (×9): 25 mg via ORAL
  Filled 2014-10-09 (×10): qty 1

## 2014-10-09 MED ORDER — LEVETIRACETAM 500 MG PO TABS
500.0000 mg | ORAL_TABLET | Freq: Two times a day (BID) | ORAL | Status: DC
  Administered 2014-10-09 – 2014-10-19 (×20): 500 mg via ORAL
  Filled 2014-10-09 (×7): qty 1
  Filled 2014-10-09: qty 28
  Filled 2014-10-09 (×9): qty 1
  Filled 2014-10-09: qty 28
  Filled 2014-10-09 (×4): qty 1
  Filled 2014-10-09: qty 28
  Filled 2014-10-09 (×2): qty 1
  Filled 2014-10-09: qty 28
  Filled 2014-10-09: qty 1

## 2014-10-09 MED ORDER — FLUOXETINE HCL 20 MG PO CAPS
20.0000 mg | ORAL_CAPSULE | Freq: Every day | ORAL | Status: DC
  Administered 2014-10-10 – 2014-10-11 (×2): 20 mg via ORAL
  Filled 2014-10-09 (×3): qty 1

## 2014-10-09 MED ORDER — CLONAZEPAM 1 MG PO TABS
1.0000 mg | ORAL_TABLET | Freq: Three times a day (TID) | ORAL | Status: DC | PRN
  Administered 2014-10-09 – 2014-10-11 (×4): 1 mg via ORAL
  Filled 2014-10-09 (×4): qty 1

## 2014-10-09 MED ORDER — ALUM & MAG HYDROXIDE-SIMETH 200-200-20 MG/5ML PO SUSP: 30.0000 mL | ORAL | Status: DC | PRN

## 2014-10-09 MED ORDER — DIPHENHYDRAMINE HCL 25 MG PO CAPS: 25.0000 mg | ORAL_CAPSULE | Freq: Four times a day (QID) | ORAL | Status: DC | PRN

## 2014-10-09 MED ORDER — TRAZODONE HCL 50 MG PO TABS
50.0000 mg | ORAL_TABLET | Freq: Every evening | ORAL | Status: DC | PRN
  Filled 2014-10-09 (×6): qty 1

## 2014-10-09 MED ORDER — ACETAMINOPHEN 325 MG PO TABS: 650.0000 mg | ORAL_TABLET | Freq: Four times a day (QID) | ORAL | Status: DC | PRN

## 2014-10-09 MED ORDER — PANTOPRAZOLE SODIUM 40 MG PO TBEC
40.0000 mg | DELAYED_RELEASE_TABLET | Freq: Two times a day (BID) | ORAL | Status: DC
  Administered 2014-10-09 – 2014-10-19 (×20): 40 mg via ORAL
  Filled 2014-10-09 (×26): qty 1

## 2014-10-09 NOTE — Progress Notes (Signed)
Pt did not attend group this evening.  

## 2014-10-09 NOTE — Progress Notes (Signed)
Arranged transport for 6PM via [place] to [facility]

## 2014-10-09 NOTE — Progress Notes (Signed)
4:21 PM Report given to Jinny Sanders ,RN at behavioral health
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
## 2014-10-09 NOTE — Progress Notes (Signed)
Pt is a 50 year old female admitted with depression and status post overdose attempt   Pt overdosed on tylenol and was at the hospital being treated for same for several days   She is stable but is unsteady on her feet   Pt is depressed and hopeless   She continues to express passive si but denies any plans to carry it out   She has a seizure disorder and has been non compliant with her medications    She has attempted overdose before on tylenol   Pt reports her family doesn't want her here and that is her biggest stressor   Pt was oriented to the unit and given nourishment   Orders received and medications to be given according to orders    Q 15 min checks   Pt is adjusting well

## 2014-10-09 NOTE — Tx Team (Signed)
Initial Interdisciplinary Treatment Plan   PATIENT STRESSORS: Financial difficulties Health problems Medication change or noncompliance   PATIENT STRENGTHS: Capable of independent living General fund of knowledge   PROBLEM LIST: Problem List/Patient Goals Date to be addressed Date deferred Reason deferred Estimated date of resolution  "I want to work on my depression and anxiety"                                                       DISCHARGE CRITERIA:  Improved stabilization in mood, thinking, and/or behavior Motivation to continue treatment in a less acute level of care Verbal commitment to aftercare and medication compliance  PRELIMINARY DISCHARGE PLAN: Attend aftercare/continuing care group Outpatient therapy Return to previous living arrangement  PATIENT/FAMIILY INVOLVEMENT: This treatment plan has been presented to and reviewed with the patient, [name], and/or family member, .  The patient and family have been given the opportunity to ask questions and make suggestions.  Migdalia Dk 10/09/2014, 9:07 PM

## 2014-10-09 NOTE — Clinical Social Work Psych Note (Signed)
Patient to be discharged to [facility] on 10/09/2014. Patient updated at bedside regarding discharge disposition.  Patient completed Voluntary Admission form and CSW placed on patient's chart.  Patient to be admitted to [facility] Room 401 Bed 2 Admitting attending: Dr. Parke Poisson Transportation: [place] [phone])  Lubertha Sayres, [place] ([phone]) Licensed Clinical Social Worker Orthopedics [phone]) and Surgical [phone])

## 2014-10-09 NOTE — Progress Notes (Signed)
[name] to be D/C'd Behavioral Health per MD order.  Discussed with the patient and all questions fully answered.    Medication List    STOP taking these medications        Oxcarbazepine 300 MG tablet  Commonly known as:  TRILEPTAL      TAKE these medications        clonazePAM 1 MG tablet  Commonly known as:  KLONOPIN  Take 1 tablet (1 mg total) by mouth 3 (three) times daily as needed for anxiety.     diphenhydrAMINE 25 mg capsule  Commonly known as:  BENADRYL  Take 1 capsule (25 mg total) by mouth every 6 (six) hours as needed for itching or allergies.     feeding supplement (ENSURE COMPLETE) Liqd  Take 237 mLs by mouth 2 (two) times daily between meals.     FLUoxetine 20 MG capsule  Commonly known as:  PROZAC  Take 1 capsule (20 mg total) by mouth daily.     levETIRAcetam 500 MG tablet  Commonly known as:  KEPPRA  Take 500 mg by mouth 2 (two) times daily.     pantoprazole 40 MG tablet  Commonly known as:  PROTONIX  Take 1 tablet (40 mg total) by mouth 2 (two) times daily.        VVS, Skin clean, dry and intact without evidence of skin break down, no evidence of skin tears noted. IV catheter discontinued intact. Site without signs and symptoms of complications. Dressing and pressure applied.  Per Jinny Sanders at [facility], [place] Director of 5W and RN pt allowed to wear jacket and sneakers during transport. Security searching belongings before patient allowed to wear them.  Delman Cheadle 10/09/2014 3:53 PM

## 2014-10-09 NOTE — Discharge Summary (Addendum)
Physician Discharge Summary  VANISSA STRENGTH OEV:[id number] DOB: 02-13-1966 DOA: 10/05/2014  PCP: Imelda Pillow, NP  Admit date: 10/05/2014 Discharge date: 10/09/2014  Time spent: 35 minutes  Recommendations for Outpatient Follow-up:  INPATIENT PSYCH PPI BID x 2 weeks then daily Will need GI follow up  Discharge Diagnoses:  Principal Problem:   Acute hepatitis Active Problems:   Tylenol overdose   Hypokalemia   Bipolar disorder   Vomiting   Acute renal insufficiency   Seizure disorder   Hyperammonemia   Suicidal ideation   GI bleed   Duodenal ulcer hemorrhage   Acute blood loss anemia   Liver failure, acute   Discharge Condition: improved  Diet recommendation: regular  Filed Weights   10/05/14 0616 10/05/14 1953 10/07/14 0423  Weight: 56.7 kg (125 lb) 57.9 kg (127 lb 10.3 oz) 61.5 kg (135 lb 9.3 oz)    History of present illness:  [name] is a 49 y.o. female  With h/o bipolar disorder, multiple previous suicide attempts, borderline personality disorder, seizures and pseudoseizures, migraines, eating disorder, h/o substance abuse presents to ED via EMS c/o coffee ground emesis and black stools. Also c/o burning chest pain and suicidal ideation. Was admitted to hospital with viral gastroenteritis about a week ago. Today, AST 2286, ALT 4112. T bili 1.6. Alk phos 137. WBC 15,000. H/h normal, potassium 3.2. BUN 23. Creat 1.47. Lipase 90. CT abdomen pelvis show severe hepatic steatosis. LFTs and creatinine normal a week ago. acetominophen level 61. Repeat 4 hours later negative. Initially said she took 1 tylenol. After further questioning, admits to taking several tylenol PMs over the past week, but won't quantify further. "wanted to sleep". Family reports she has been sleepy all week and an empty tylenol bottle found in her house, with emesis containing pill fragments. Has been unable to afford any of her psychiatric or seizure medications. Denies alcohol use  or other drug use. Feels hopeless and no longer wants to live. Has had no melena in ED, had "dark" emesis in ED, not coffee ground or bloody. Reports previous EGD many years ago for "stomach problems".  Hospital Course:  Acute liver injury secondary to tylenol toxicity. LFTs, INR improving. Acetylcysteine stopped per poison control's recommendations.   Intractable Vomiting for >1 week with coffee ground emesis. Appreciate GIs assistance. 3 duodenal ulcers were found. Continue twice a day PPI for 2 weeks then daily. Biopsy pending. If H. pylori found, would treat.  Acute blood loss anemia:  No evidence of ongoing bleeding.  Suicidal ideation/probable attempt: inpatient psychiatry   Hypokalemia: repleated  Bipolar disorder and Borderline personality disorder:  Patient was on benzodiazepines as an outpatient.   Acute renal insufficiency secondary to prerenal azotemia: resolved  Seizure disorder with h/o pseudoseizures per old records. Change Keppra to by mouth.  Hyperammonemia: No evidence of ongoing encephalopathy. H/o eating disorder per old records  Procedures:  EGD  Consultations:  Psych  GI  Discharge Exam: Filed Vitals:   10/09/14 0516  BP: 123/66  Pulse: 99  Temp: 98.4 F (36.9 C)  Resp: 18    General: irritable Cardiovascular: rrr Respiratory: clear  Discharge Instructions   Discharge Instructions    Diet general    Complete by:  As directed      Discharge instructions    Complete by:  As directed   Continue twice a day PPI for 2 weeks then daily     Increase activity slowly    Complete by:  As directed  Current Discharge Medication List    START taking these medications   Details  diphenhydrAMINE (BENADRYL) 25 mg capsule Take 1 capsule (25 mg total) by mouth every 6 (six) hours as needed for itching or allergies. Qty: 30 capsule, Refills: 0    FLUoxetine (PROZAC) 20 MG capsule Take 1 capsule (20 mg total) by mouth  daily. Refills: 3    pantoprazole (PROTONIX) 40 MG tablet Take 1 tablet (40 mg total) by mouth 2 (two) times daily.      CONTINUE these medications which have NOT CHANGED   Details  clonazePAM (KLONOPIN) 1 MG tablet Take 1 tablet (1 mg total) by mouth 3 (three) times daily as needed for anxiety. Qty: 20 tablet, Refills: 0    feeding supplement, ENSURE COMPLETE, (ENSURE COMPLETE) LIQD Take 237 mLs by mouth 2 (two) times daily between meals. Qty: 20 Bottle, Refills: 2    levETIRAcetam (KEPPRA) 500 MG tablet Take 500 mg by mouth 2 (two) times daily.      STOP taking these medications     Oxcarbazepine (TRILEPTAL) 300 MG tablet        Allergies  Allergen Reactions  . Aspirin Anaphylaxis  . Doxycycline Anaphylaxis  . Imitrex [Sumatriptan Base] Other (See Comments)    Severe hypertension  . Iodinated Diagnostic Agents Anaphylaxis  . Lidocaine Anaphylaxis    Pt states she does well with Marcaine/Bupivicaine without problem  . Prednisone Other (See Comments)    She goes crazy  . Sulfa Antibiotics Anaphylaxis  . Sulfasalazine Anaphylaxis  . Sumatriptan Other (See Comments)    Felt like she was "having a stroke", heart rate and blood pressure "went through the roof"  . Tramadol Other (See Comments)    seizures  . Levofloxacin Other (See Comments)    Joints hurt  . Latex Rash  . Levofloxacin Rash and Other (See Comments)    Joints hurt  . Metrizamide Other (See Comments)  . Nsaids Rash and Other (See Comments)    GI bleed  . Tolmetin Rash      The results of significant diagnostics from this hospitalization (including imaging, microbiology, ancillary and laboratory) are listed below for reference.    Significant Diagnostic Studies: Ct Abdomen Pelvis Wo Contrast  10/05/2014   CLINICAL DATA:  Acute onset of hematemesis and melena over the past several days. Surgical history includes cholecystectomy, appendectomy and hysterectomy. Current history of depression and alcohol  use.  EXAM: CT ABDOMEN AND PELVIS WITHOUT CONTRAST  TECHNIQUE: Multidetector CT imaging of the abdomen and pelvis was performed following the standard protocol without IV contrast.  COMPARISON:  09/30/2014, 01/06/2010, 03/12/2004.  FINDINGS: Severe diffuse hepatic steatosis without focal parenchymal abnormality, allowing for the unenhanced technique. Normal unenhanced appearance of the spleen, pancreas, right adrenal gland, and both kidneys. Approximate 1.6 x 1.0 cm low-attenuation nodule involving the left adrenal gland, unchanged. Gallbladder surgically absent. No unexpected biliary ductal dilation. Mild iliac atherosclerosis. No significant lymphadenopathy.  Normal appearing stomach and small bowel. Entire colon relatively decompressed with the exception of mild gaseous distention of the sigmoid colon, though no obstructing colonic mass is identified. No ascites. No visible gastric or esophageal varices.  Urinary bladder unremarkable. Uterus surgically absent. No adnexal masses or free pelvic fluid.  Bone window images demonstrate mild degenerative changes at T9-10 with Schmorl's nodes in multiple thoracic vertebrae. Visualized lung bases clear. Heart size normal.  IMPRESSION: 1. No acute abnormality involving the abdomen or pelvis. 2. Severe diffuse hepatic steatosis. 3. Stable left adrenal adenoma.  Electronically Signed   By: Evangeline Dakin M.D.   On: 10/05/2014 15:12   Ct Abdomen Pelvis Wo Contrast  09/30/2014   CLINICAL DATA:  Abdominal pain.  Nausea, vomiting, diarrhea.  EXAM: CT ABDOMEN AND PELVIS WITHOUT CONTRAST  TECHNIQUE: Multidetector CT imaging of the abdomen and pelvis was performed following the standard protocol without IV contrast.  COMPARISON:  01/06/2010  FINDINGS: The included lung bases are clear.  Clips in the gallbladder fossa from cholecystectomy. Mild prominence of the biliary tree, a common finding postcholecystectomy. This is unchanged from prior exam. There is focal fatty  infiltration adjacent to the falciform ligament, the unenhanced liver is otherwise unremarkable. The unenhanced spleen, pancreas, and adrenal glands are normal. The kidneys are symmetric in size without stones or hydronephrosis. There is no perinephric stranding. Both ureters are decompressed without stones along the course.  Ingested oral contrast remains in the stomach. There is questionable gastric wall thickening. Small and large bowel loops are mildly fluid filled but without bowel wall thickening or dilatation. No perienteric inflammatory change. The appendix is normal. No free air, free fluid, or intra-abdominal fluid collection. Abdominal aorta is normal in caliber with mild atherosclerosis. No retroperitoneal adenopathy.  Within the pelvis, the urinary bladder is minimally distended. The uterus is not seen, presumably surgically absent. There is no adnexal mass. No pelvic free fluid.  No acute or suspicious osseous abnormality. There is a bone island in the left proximal femur.  IMPRESSION: 1. Questionable gastric wall thickening. Small and large bowel loops are fluid-filled but otherwise normal. Findings may reflect mild gastroenteritis. 2. Postcholecystectomy with unchanged prominence of the common bile duct.   Electronically Signed   By: Jeb Levering M.D.   On: 09/30/2014 22:24   Dg Shoulder Right  10/05/2014   CLINICAL DATA:  Status post fall down steps, with persistent right shoulder pain. Initial encounter.  EXAM: RIGHT SHOULDER - 2+ VIEW  COMPARISON:  Right shoulder radiographs performed 01/06/2010  FINDINGS: There is no evidence of fracture or dislocation. The right humeral head is seated within the glenoid fossa. Minimal degenerative change is noted at the right acromioclavicular joint. No significant soft tissue abnormalities are seen. The visualized portions of the right lung are clear.  IMPRESSION: No evidence of fracture or dislocation.   Electronically Signed   By: Garald Balding M.D.    On: 10/05/2014 21:54   Dg Chest Port 1 View  10/05/2014   CLINICAL DATA:  SOB, emesis, diarrhea x 4 days. Chest pain radiates down left arm x 3 days with headache and abdominal pain  EXAM: PORTABLE CHEST - 1 VIEW  COMPARISON:  09/30/2014.  FINDINGS: Normal sized heart. Clear lungs with normal vascularity. Mild scoliosis.  IMPRESSION: No acute abnormality.   Electronically Signed   By: Enrique Sack M.D.   On: 10/05/2014 07:55   Dg Chest Portable 1 View  09/30/2014   CLINICAL DATA:  Central line placement.  Initial encounter.  EXAM: PORTABLE CHEST - 1 VIEW  COMPARISON:  Chest radiograph performed 07/13/2014  FINDINGS: The patient's right IJ line is seen extending into the right axillary vein. On discussion with the clinical team, repositioning may be too difficult at this time, given the patient's difficulties with vascular access.  The lungs are well-aerated and clear. There is no evidence of focal opacification, pleural effusion or pneumothorax.  The cardiomediastinal silhouette is within normal limits. No acute osseous abnormalities are seen.  IMPRESSION: 1. Left IJ line noted extending into the right axillary  vein. On discussion with the clinical team, repositioning may be too difficult at this time, given the patient's difficulties with vascular access. 2. No acute cardiopulmonary process seen.  These results were called by telephone at the time of interpretation on 09/30/2014 at 11:10 pm to Dr. Shirlyn Goltz, who verbally acknowledged these results.   Electronically Signed   By: Garald Balding M.D.   On: 09/30/2014 23:10   Dg Shoulder Left  10/05/2014   CLINICAL DATA:  Status post fall, with persistent left shoulder pain. Initial encounter.  EXAM: LEFT SHOULDER - 2+ VIEW  COMPARISON:  None.  FINDINGS: There is no evidence of fracture or dislocation. The left humeral head is seated within the glenoid fossa. The acromioclavicular joint is unremarkable in appearance. No significant soft tissue abnormalities are  seen. The visualized portions of the left lung are clear.  IMPRESSION: No evidence of fracture or dislocation.   Electronically Signed   By: Garald Balding M.D.   On: 10/05/2014 21:54    Microbiology: Recent Results (from the past 240 hour(s))  MRSA PCR Screening     Status: None   Collection Time: 10/05/14  8:27 PM  Result Value Ref Range Status   MRSA by PCR NEGATIVE NEGATIVE Final    Comment:        The GeneXpert MRSA Assay (FDA approved for NASAL specimens only), is one component of a comprehensive MRSA colonization surveillance program. It is not intended to diagnose MRSA infection nor to guide or monitor treatment for MRSA infections.   MRSA PCR Screening     Status: None   Collection Time: 10/07/14 11:06 PM  Result Value Ref Range Status   MRSA by PCR NEGATIVE NEGATIVE Final    Comment:        The GeneXpert MRSA Assay (FDA approved for NASAL specimens only), is one component of a comprehensive MRSA colonization surveillance program. It is not intended to diagnose MRSA infection nor to guide or monitor treatment for MRSA infections.      Labs: Basic Metabolic Panel:  Recent Labs Lab 10/05/14 2222 10/06/14 0506 10/07/14 0640 10/08/14 0338 10/08/14 2027 10/09/14 0345  NA  --  144 140 141 142 142  K  --  2.7* 3.0* 2.9* 4.1 4.2  CL  --  116* 107 102 103 106  CO2  --  19 28 33* 28 36*  GLUCOSE  --  99 111* 142* 147* 95  BUN  --  21 <5* <5* <5* <5*  CREATININE  --  0.89 0.53 0.59 0.64 0.59  CALCIUM  --  8.7 8.3* 8.8 9.2 9.2  MG 2.5  --   --  1.8  --   --    Liver Function Tests:  Recent Labs Lab 10/05/14 0758 10/06/14 0506 10/07/14 0640 10/08/14 0338  AST 2286* 425* 127* 63*  ALT 4112* 1678* 941* 675*  ALKPHOS 137* 80 68 66  BILITOT 1.6* 2.7* 1.9* 1.0  PROT 7.1 4.6* 4.5* 4.8*  ALBUMIN 4.6 2.8* 2.7* 2.9*    Recent Labs Lab 10/05/14 0758  LIPASE 90*    Recent Labs Lab 10/05/14 1430  AMMONIA 78*   CBC:  Recent Labs Lab  10/05/14 0758 10/06/14 0506 10/07/14 0420 10/07/14 0640 10/08/14 0338 10/09/14 0345  WBC 15.0* 9.6 4.8  --  3.7* 4.0  NEUTROABS 12.5*  --   --   --   --   --   HGB 14.7 10.3* 7.0* 9.1* 9.3* 9.6*  HCT 41.3 28.2* 19.9* 25.2*  26.0* 27.8*  MCV 87.9 85.7 89.2  --  88.1 89.4  PLT 285 272 137*  --  142* 125*   Cardiac Enzymes: No results for input(s): CKTOTAL, CKMB, CKMBINDEX, TROPONINI in the last 168 hours. BNP: BNP (last 3 results) No results for input(s): PROBNP in the last 8760 hours. CBG: No results for input(s): GLUCAP in the last 168 hours.     SignedEulogio Bear  [facility] 10/09/2014, 8:25 AM

## 2014-10-10 DIAGNOSIS — F3163 Bipolar disorder, current episode mixed, severe, without psychotic features: Principal | ICD-10-CM

## 2014-10-10 DIAGNOSIS — T391X2A Poisoning by 4-Aminophenol derivatives, intentional self-harm, initial encounter: Secondary | ICD-10-CM

## 2014-10-10 MED ORDER — OXCARBAZEPINE 150 MG PO TABS
150.0000 mg | ORAL_TABLET | Freq: Two times a day (BID) | ORAL | Status: DC
Start: 2014-10-10 — End: 2014-10-19
  Administered 2014-10-10 – 2014-10-19 (×18): 150 mg via ORAL
  Filled 2014-10-10: qty 28
  Filled 2014-10-10 (×6): qty 1
  Filled 2014-10-10: qty 28
  Filled 2014-10-10 (×4): qty 1
  Filled 2014-10-10: qty 28
  Filled 2014-10-10 (×4): qty 1
  Filled 2014-10-10: qty 28
  Filled 2014-10-10 (×8): qty 1

## 2014-10-10 MED ORDER — DIPHENHYDRAMINE HCL 50 MG PO CAPS
50.0000 mg | ORAL_CAPSULE | Freq: Once | ORAL | Status: AC
  Administered 2014-10-10: 50 mg via ORAL

## 2014-10-10 NOTE — Progress Notes (Signed)
Patient ID: [name], female   DOB: 05-06-1966, 49 y.o.   MRN: [id number]  Pt currently presents with a flat affect and irritable behavior. Per self inventory, pt rates depression at a 1, hopelessness 1 and anxiety 0. Pt's daily goal is to "getting meds set" and they intend to do so by "wait for dr. and try not to lose hope." Pt also reports "We don't come in knowing how things work and some kindess is appreciated.  Pt reports poor sleep, concentration and a appetite. Pt provided with medications per providers orders. Pt's labs and vitals were monitored throughout the day. Pt supported emotionally and encouraged to express concerns and questions. Pt educated on medications and alternative stress management. Pt's safety ensured with 15 minute and environmental checks. Pt currently denies SI/HI and A/V hallucinations. Pt verbally agrees to seek staff if SI/HI or A/VH occurs and to consult with staff before acting on these thoughts. Pt reports "I don't care enough to hurt myself" when asked about SI. When asked what staff can do to help her with her increasing feelings of anxiety, pt states "get me my medication."

## 2014-10-10 NOTE — [facility] Suicide Risk Assessment (Signed)
[facility] Admission Suicide Risk Assessment   Nursing information obtained from:    Demographic factors:   49 year old divorced woman, lives alone  Current Mental Status:   See below Loss Factors:   limited support network Historical Factors:   History of Depression- has been diagnosed with Bipolar Disorder in the past  Risk Reduction Factors:   Resilience Total Time spent with patient: 45 minutes Principal Problem: Depression, S/P Acetaminophen overdose  Diagnosis:   Patient Active Problem List   Diagnosis Date Noted  . Bipolar 1 disorder, mixed, severe [F31.63] 10/09/2014  . Acute blood loss anemia [D62] 10/07/2014  . Liver failure, acute [K72.00]   . GI bleed [K92.2] 10/06/2014  . Duodenal ulcer hemorrhage [K26.4] 10/06/2014  . Vomiting [R11.10] 10/05/2014  . Acute hepatitis [K72.00] 10/05/2014  . Acute renal insufficiency [N28.9] 10/05/2014  . Seizure disorder [G40.909] 10/05/2014  . Hyperammonemia [E72.20] 10/05/2014  . Suicidal ideation [R45.851] 10/05/2014  . Bipolar disorder [F31.9] 10/01/2014  . Major depression [F32.2] 10/01/2014  . Nausea and vomiting [R11.2] 09/30/2014  . Diarrhea [R19.7] 09/30/2014  . SIRS (systemic inflammatory response syndrome) [A41.9] 07/15/2014  . Tylenol overdose [T39.1X4A] 07/13/2014  . Hypokalemia [E87.6] 07/13/2014  . Depression [F32.9] 05/25/2012     Continued Clinical Symptoms:  Alcohol Use Disorder Identification Test Final Score (AUDIT): 0 The "Alcohol Use Disorders Identification Test", Guidelines for Use in Primary Care, Second Edition.  World Pharmacologist [facility]). Score between 0-7:  no or low risk or alcohol related problems. Score between 8-15:  moderate risk of alcohol related problems. Score between 16-19:  high risk of alcohol related problems. Score 20 or above:  warrants further diagnostic evaluation for alcohol dependence and treatment.   CLINICAL FACTORS:  Patient required inpatient medical treatment due to  acetaminophen overdose, with subsequent liver compromise.   Musculoskeletal: Strength & Muscle Tone: within normal limits Gait & Station: normal Patient leans: N/A  Psychiatric Specialty Exam: Physical Exam  ROS at this time denies chest pain, SOB, abdominal pain, no current active bleeding ( reports recent episode of hematemesis), no peripheral edema, no fever.  Blood pressure 106/77, pulse 127, temperature 98.5 F (36.9 C), temperature source Oral, resp. rate 16, height 5\' 2"  (1.575 m), weight 118 lb (53.524 kg).Body mass index is 21.58 kg/(m^2).  General Appearance: Fairly Groomed  Engineer, water::  Good  Speech:  Normal Rate  Volume:  Normal  Mood:  Depressed and Irritable  Affect:  Congruent and Depressed  Thought Process:  Linear  Orientation:  Other:  alert and attentive  Thought Content:  ruminative, feels she has little support and few people who " care about me", no hallucinations,no delusions  Suicidal Thoughts:  No (+) passive thoughts of death, which she states are chronic, but at this time denies any  Plans or intention of hurting self.   Homicidal Thoughts:  No  Memory:  recent and remote grossly intact  Judgement:  Fair  Insight:  Fair  Psychomotor Activity:  Decreased  Concentration:  Good  Recall:  Good  Fund of Knowledge:Good  Language: Good  Akathisia:  Negative  Handed:  Right  AIMS (if indicated):     Assets:  Desire for Improvement Resilience  Sleep:  Number of Hours: 3.75  Cognition: WNL  ADL's: fair      COGNITIVE FEATURES THAT CONTRIBUTE TO RISK:  Closed-mindedness    SUICIDE RISK:   Moderate:  Frequent suicidal ideation with limited intensity, and duration, some specificity in terms of plans, no associated  intent, good self-control, limited dysphoria/symptomatology, some risk factors present, and identifiable protective factors, including available and accessible social support.  PLAN OF CARE: Patient will be admitted to inpatient psychiatric  unit for stabilization and safety. Will provide and encourage milieu participation. Provide medication management and maked adjustments as needed.  Will follow daily.  Patient will be admitted to inpatient psychiatric unit for stabilization and safety. Will provide and encourage milieu participation. Provide medication management and maked adjustments as needed.  Will follow daily.    Medical Decision Making:  Review of Psycho-Social Stressors (1), Review or order clinical lab tests (1), Established Problem, Worsening (2) and Review of Medication Regimen & Side Effects (2)  I certify that inpatient services furnished can reasonably be expected to improve the patient's condition.   COBOS, FERNANDO 10/10/2014, 5:58 PM

## 2014-10-10 NOTE — Progress Notes (Signed)
D.  Pt in bed on approach, irritable and upset over not being ordered pain medication today.  Pt asked if she was going to get her Tramadol tonight, and was unhappy that she could not have it.  Pt states that she feels this is undermining her recovery.  Pt did not feel up to going to evening group.  Pt has remained in room for duration of shift.  Denies SI/HI/hallucinations at this time.  A.  Support and encouragement offered  R.  Pt remains safe on unit.  Will continue to monitor.

## 2014-10-10 NOTE — H&P (Signed)
Psychiatric Admission Assessment Adult  Patient Identification: [name] MRN:  [id number] Date of Evaluation:  10/10/2014 Chief Complaint:  MDD Principal Diagnosis: Depression, suicidal attempt Diagnosis:   Patient Active Problem List   Diagnosis Date Noted  . Bipolar 1 disorder, mixed, severe [F31.63] 10/09/2014  . Acute blood loss anemia [D62] 10/07/2014  . Liver failure, acute [K72.00]   . GI bleed [K92.2] 10/06/2014  . Duodenal ulcer hemorrhage [K26.4] 10/06/2014  . Vomiting [R11.10] 10/05/2014  . Acute hepatitis [K72.00] 10/05/2014  . Acute renal insufficiency [N28.9] 10/05/2014  . Seizure disorder [G40.909] 10/05/2014  . Hyperammonemia [E72.20] 10/05/2014  . Suicidal ideation [R45.851] 10/05/2014  . Bipolar disorder [F31.9] 10/01/2014  . Major depression [F32.2] 10/01/2014  . Nausea and vomiting [R11.2] 09/30/2014  . Diarrhea [R19.7] 09/30/2014  . SIRS (systemic inflammatory response syndrome) [A41.9] 07/15/2014  . Tylenol overdose [T39.1X4A] 07/13/2014  . Hypokalemia [E87.6] 07/13/2014  . Depression [F32.9] 05/25/2012   History of Present Illness: Patient is a 49 year-old single white female who lives alone in [place]. She works as a Cytogeneticist. The patient was admitted after presenting to the ED complaining of coffee-ground emesis and black stools, burning chest pain and suicidal ideation. She admitted to primary M.D. that she took several Tylenol PM's over the past week claiming she wanted to sleep. A family member found empty Tylenol bottle in her house and emesis containing pill fragments.  Patient had OD on Acetaminophen in October 2015 was admitted to [facility] in [place] for stabilization and possible liver transplant due to injury.  She states that she was an established patient of Dr Sabra Heck and was prescribed with Klonopin.  She no longer has this current because of the liver injury.    Seen today for admission interview.   She was tearful but able to answer questions.  She does admit that she no longer wants to live because "my family doesn't want me here".  [name] does feel that her doctors or healthcare have failed her primarily because they have dc'd certain meds that help her function.  She has family of hx of suicide. She does deny auditory or visual hallucinations paranoid ideation or substance abuse.  Drug screen is positive for benzodiazepines.  Patient is concerned that she will not be getting medications ie. Klonopin or pain killers while she is here or a Rx when she is discharged.    Elements:  Location:  Depression. Quality:  Feel hopeless, worthless. Severity:  Severe. Timing:  in the last few days. Duration:  Chronic, intermittent. Context:  "No one cares, [facility] stopped all my meds, what am I supposed to do?".  Associated Signs/Symptoms: Depression Symptoms:  depressed mood, hopelessness, (Hypo) Manic Symptoms:  Labiality of Mood, Anxiety Symptoms:  Excessive Worry, Psychotic Symptoms:  NA PTSD Symptoms: NA Total Time spent with patient: 45 minutes  Past Medical History:  Past Medical History  Diagnosis Date  . Migraines   . Endometriosis   . ETOH abuse     sober 3 1/2 years  . Anorexia   . Depression   . Anxiety   . DJD (degenerative joint disease) of cervical spine   . Seizures   . PICC (peripherally inserted central catheter) in place     rt neck    Past Surgical History  Procedure Laterality Date  . Knee surgery    . Abdominal surgery    . Cholecystectomy    . Appendectomy    . Abdominal hysterectomy    .  Nasal sinus surgery    . Laproscopy    . Carpal tunnel release      2010  . Esophagogastroduodenoscopy N/A 10/06/2014    Procedure: ESOPHAGOGASTRODUODENOSCOPY (EGD);  Surgeon: Inda Castle, MD;  Location: [place];  Service: Endoscopy;  Laterality: N/A;   Family History:  Family History  Problem Relation Age of Onset  . Hypertension Mother   . Hyperlipidemia  Mother   . Heart failure Father   . Cancer Other    Social History:  History  Alcohol Use No     History  Drug Use No    History   Social History  . Marital Status: Divorced    Spouse Name: N/A    Number of Children: N/A  . Years of Education: N/A   Social History Main Topics  . Smoking status: Current Every Day Smoker -- 0.25 packs/day for 20 years    Types: Cigarettes  . Smokeless tobacco: Never Used  . Alcohol Use: No  . Drug Use: No  . Sexual Activity: None   Other Topics Concern  . None   Social History Narrative   Additional Social History:    History of alcohol / drug use?: Yes Longest period of sobriety (when/how long): clean for 4 years  Musculoskeletal: Strength & Muscle Tone: within normal limits Gait & Station: unsteady Patient leans: N/A  Psychiatric Specialty Exam: Physical Exam  Vitals reviewed. Psychiatric: She has a normal mood and affect. Judgment normal.    Review of Systems  Constitutional: Negative.   HENT: Negative.   Eyes: Negative.   Respiratory: Negative.   Cardiovascular: Negative.   Gastrointestinal: Negative.   Genitourinary: Negative.   Musculoskeletal: Negative.   Skin: Negative.   Neurological: Negative.   Endo/Heme/Allergies: Negative.   Psychiatric/Behavioral: Positive for depression. Negative for suicidal ideas, hallucinations, memory loss and substance abuse. The patient is nervous/anxious. The patient does not have insomnia.     Blood pressure 106/77, pulse 127, temperature 98.5 F (36.9 C), temperature source Oral, resp. rate 16, height '5\' 2"'  (1.575 m), weight 53.524 kg (118 lb).Body mass index is 21.58 kg/(m^2).   General Appearance: Fairly Groomed and Guarded  Engineer, water:: Minimal  Speech: Slow  Volume: Decreased  Mood: Angry, Anxious and Hopeless  Affect: Constricted, Depressed, Flat and Inappropriate  Thought Process: Loose  Orientation: Full (Time, Place, and Person)  Thought Content:  Rumination  Suicidal Thoughts: Yes. with intent/plan  Homicidal Thoughts: No  Memory: Immediate; Poor Recent; Poor Remote; Poor  Judgement: Impaired  Insight: Lacking  Psychomotor Activity: Decreased  Concentration: Fair  Recall: Poor  Fund of Knowledge:Poor  Language: Fair  Akathisia: No  Handed: Right  AIMS (if indicated):    Assets: Communication Skills  Sleep:     Risk to Self: Is patient at risk for suicide?: Yes What has been your use of drugs/alcohol within the last 12 months?: In revocery from alcohol and prescription medications for 3 years Risk to Others:   Prior Inpatient Therapy:   Prior Outpatient Therapy:    Alcohol Screening: 1. How often do you have a drink containing alcohol?: Never 9. Have you or someone else been injured as a result of your drinking?: No 10. Has a relative or friend or a doctor or another health worker been concerned about your drinking or suggested you cut down?: No Alcohol Use Disorder Identification Test Final Score (AUDIT): 0 Brief Intervention: AUDIT score less than 7 or less-screening does not suggest unhealthy drinking-brief intervention not indicated  Allergies:   Allergies  Allergen Reactions  . Aspirin Anaphylaxis  . Doxycycline Anaphylaxis  . Imitrex [Sumatriptan Base] Other (See Comments)    Severe hypertension  . Iodinated Diagnostic Agents Anaphylaxis  . Lidocaine Anaphylaxis    Pt states she does well with Marcaine/Bupivicaine without problem  . Prednisone Other (See Comments)    She goes crazy  . Sulfa Antibiotics Anaphylaxis  . Sulfasalazine Anaphylaxis  . Sumatriptan Other (See Comments)    Felt like she was "having a stroke", heart rate and blood pressure "went through the roof"  . Tramadol Other (See Comments)    seizures  . Levofloxacin Other (See Comments)    Joints hurt  . Latex Rash  . Levofloxacin Rash and Other (See Comments)    Joints hurt  . Metrizamide Other (See  Comments)  . Nsaids Rash and Other (See Comments)    GI bleed  . Tolmetin Rash   Lab Results:  Results for orders placed or performed during the hospital encounter of 10/05/14 (from the past 48 hour(s))  Basic metabolic panel     Status: Abnormal   Collection Time: 10/08/14  8:27 PM  Result Value Ref Range   Sodium 142 135 - 145 mmol/L    Comment: Please note change in reference range.   Potassium 4.1 3.5 - 5.1 mmol/L    Comment: Please note change in reference range.   Chloride 103 96 - 112 mEq/L   CO2 28 19 - 32 mmol/L   Glucose, Bld 147 (H) 70 - 99 mg/dL   BUN <5 (L) 6 - 23 mg/dL    Comment: REPEATED TO VERIFY   Creatinine, Ser 0.64 0.50 - 1.10 mg/dL   Calcium 9.2 8.4 - 10.5 mg/dL   GFR calc non Af Amer >90 >90 mL/min   GFR calc Af Amer >90 >90 mL/min    Comment: (NOTE) The eGFR has been calculated using the CKD EPI equation. This calculation has not been validated in all clinical situations. eGFR's persistently <90 mL/min signify possible Chronic Kidney Disease.    Anion gap 11 5 - 15  CBC     Status: Abnormal   Collection Time: 10/09/14  3:45 AM  Result Value Ref Range   WBC 4.0 4.0 - 10.5 K/uL   RBC 3.11 (L) 3.87 - 5.11 MIL/uL   Hemoglobin 9.6 (L) 12.0 - 15.0 g/dL   HCT 27.8 (L) 36.0 - 46.0 %   MCV 89.4 78.0 - 100.0 fL   MCH 30.9 26.0 - 34.0 pg   MCHC 34.5 30.0 - 36.0 g/dL   RDW 13.1 11.5 - 15.5 %   Platelets 125 (L) 150 - 400 K/uL  Basic metabolic panel     Status: Abnormal   Collection Time: 10/09/14  3:45 AM  Result Value Ref Range   Sodium 142 135 - 145 mmol/L    Comment: Please note change in reference range.   Potassium 4.2 3.5 - 5.1 mmol/L    Comment: Please note change in reference range.   Chloride 106 96 - 112 mEq/L   CO2 36 (H) 19 - 32 mmol/L   Glucose, Bld 95 70 - 99 mg/dL   BUN <5 (L) 6 - 23 mg/dL    Comment: REPEATED TO VERIFY   Creatinine, Ser 0.59 0.50 - 1.10 mg/dL   Calcium 9.2 8.4 - 10.5 mg/dL   GFR calc non Af Amer >90 >90 mL/min   GFR  calc Af Amer >90 >90 mL/min    Comment: (NOTE)  The eGFR has been calculated using the CKD EPI equation. This calculation has not been validated in all clinical situations. eGFR's persistently <90 mL/min signify possible Chronic Kidney Disease.    Anion gap 0 (L) 5 - 15   Current Medications: Current Facility-Administered Medications  Medication Dose Route Frequency Provider Last Rate Last Dose  . acetaminophen (TYLENOL) tablet 650 mg  650 mg Oral Q6H PRN Laverle Hobby, PA-C      . alum & mag hydroxide-simeth (MAALOX/MYLANTA) 200-200-20 MG/5ML suspension 30 mL  30 mL Oral Q4H PRN Laverle Hobby, PA-C      . clonazePAM [place]) tablet 1 mg  1 mg Oral TID PRN Laverle Hobby, PA-C   1 mg at 10/10/14 0108  . diphenhydrAMINE (BENADRYL) capsule 25 mg  25 mg Oral Q6H PRN Laverle Hobby, PA-C   25 mg at 10/10/14 1314  . feeding supplement (ENSURE COMPLETE) (ENSURE COMPLETE) liquid 237 mL  237 mL Oral BID BM Laverle Hobby, PA-C   Stopped at 10/10/14 1400  . FLUoxetine (PROZAC) capsule 20 mg  20 mg Oral Daily Laverle Hobby, PA-C   20 mg at 10/10/14 0801  . levETIRAcetam (KEPPRA) tablet 500 mg  500 mg Oral BID Laverle Hobby, PA-C   500 mg at 10/10/14 1703  . magnesium hydroxide (MILK OF MAGNESIA) suspension 30 mL  30 mL Oral Daily PRN Laverle Hobby, PA-C      . pantoprazole (PROTONIX) EC tablet 40 mg  40 mg Oral BID Laverle Hobby, PA-C   40 mg at 10/10/14 1703  . traZODone (DESYREL) tablet 50 mg  50 mg Oral QHS,MR X 1 Spencer E Simon, PA-C   50 mg at 10/09/14 2245   PTA Medications: Prescriptions prior to admission  Medication Sig Dispense Refill Last Dose  . clonazePAM (KLONOPIN) 1 MG tablet Take 1 tablet (1 mg total) by mouth 3 (three) times daily as needed for anxiety. 20 tablet 0 10/04/2014 at Unknown time  . feeding supplement, ENSURE COMPLETE, (ENSURE COMPLETE) LIQD Take 237 mLs by mouth 2 (two) times daily between meals. 20 Bottle 2 10/04/2014 at Unknown time  . FLUoxetine  (PROZAC) 20 MG capsule Take 1 capsule (20 mg total) by mouth daily.  3   . levETIRAcetam (KEPPRA) 500 MG tablet Take 500 mg by mouth 2 (two) times daily.   10/04/2014 at Unknown time    Previous Psychotropic Medications:  Yes, Trazodone, Klonopin, Trileptal, Celexa, Prozac  Substance Abuse History in the last 12 months:  States has been sober for 3.5 years  Consequences of Substance Abuse: NA  Results for orders placed or performed during the hospital encounter of 10/05/14 (from the past 72 hour(s))  MRSA PCR Screening     Status: None   Collection Time: 10/07/14 11:06 PM  Result Value Ref Range   MRSA by PCR NEGATIVE NEGATIVE    Comment:        The GeneXpert MRSA Assay (FDA approved for NASAL specimens only), is one component of a comprehensive MRSA colonization surveillance program. It is not intended to diagnose MRSA infection nor to guide or monitor treatment for MRSA infections.   Comprehensive metabolic panel     Status: Abnormal   Collection Time: 10/08/14  3:38 AM  Result Value Ref Range   Sodium 141 135 - 145 mmol/L    Comment: Please note change in reference range.   Potassium 2.9 (L) 3.5 - 5.1 mmol/L    Comment: Please note change  in reference range.   Chloride 102 96 - 112 mEq/L   CO2 33 (H) 19 - 32 mmol/L   Glucose, Bld 142 (H) 70 - 99 mg/dL   BUN <5 (L) 6 - 23 mg/dL    Comment: REPEATED TO VERIFY   Creatinine, Ser 0.59 0.50 - 1.10 mg/dL   Calcium 8.8 8.4 - 10.5 mg/dL   Total Protein 4.8 (L) 6.0 - 8.3 g/dL   Albumin 2.9 (L) 3.5 - 5.2 g/dL   AST 63 (H) 0 - 37 U/L   ALT 675 (H) 0 - 35 U/L   Alkaline Phosphatase 66 39 - 117 U/L   Total Bilirubin 1.0 0.3 - 1.2 mg/dL   GFR calc non Af Amer >90 >90 mL/min   GFR calc Af Amer >90 >90 mL/min    Comment: (NOTE) The eGFR has been calculated using the CKD EPI equation. This calculation has not been validated in all clinical situations. eGFR's persistently <90 mL/min signify possible Chronic Kidney Disease.     Anion gap 6 5 - 15  CBC     Status: Abnormal   Collection Time: 10/08/14  3:38 AM  Result Value Ref Range   WBC 3.7 (L) 4.0 - 10.5 K/uL   RBC 2.95 (L) 3.87 - 5.11 MIL/uL   Hemoglobin 9.3 (L) 12.0 - 15.0 g/dL   HCT 26.0 (L) 36.0 - 46.0 %   MCV 88.1 78.0 - 100.0 fL   MCH 31.5 26.0 - 34.0 pg   MCHC 35.8 30.0 - 36.0 g/dL   RDW 13.4 11.5 - 15.5 %   Platelets 142 (L) 150 - 400 K/uL  Magnesium     Status: None   Collection Time: 10/08/14  3:38 AM  Result Value Ref Range   Magnesium 1.8 1.5 - 2.5 mg/dL  Basic metabolic panel     Status: Abnormal   Collection Time: 10/08/14  8:27 PM  Result Value Ref Range   Sodium 142 135 - 145 mmol/L    Comment: Please note change in reference range.   Potassium 4.1 3.5 - 5.1 mmol/L    Comment: Please note change in reference range.   Chloride 103 96 - 112 mEq/L   CO2 28 19 - 32 mmol/L   Glucose, Bld 147 (H) 70 - 99 mg/dL   BUN <5 (L) 6 - 23 mg/dL    Comment: REPEATED TO VERIFY   Creatinine, Ser 0.64 0.50 - 1.10 mg/dL   Calcium 9.2 8.4 - 10.5 mg/dL   GFR calc non Af Amer >90 >90 mL/min   GFR calc Af Amer >90 >90 mL/min    Comment: (NOTE) The eGFR has been calculated using the CKD EPI equation. This calculation has not been validated in all clinical situations. eGFR's persistently <90 mL/min signify possible Chronic Kidney Disease.    Anion gap 11 5 - 15  CBC     Status: Abnormal   Collection Time: 10/09/14  3:45 AM  Result Value Ref Range   WBC 4.0 4.0 - 10.5 K/uL   RBC 3.11 (L) 3.87 - 5.11 MIL/uL   Hemoglobin 9.6 (L) 12.0 - 15.0 g/dL   HCT 27.8 (L) 36.0 - 46.0 %   MCV 89.4 78.0 - 100.0 fL   MCH 30.9 26.0 - 34.0 pg   MCHC 34.5 30.0 - 36.0 g/dL   RDW 13.1 11.5 - 15.5 %   Platelets 125 (L) 150 - 400 K/uL  Basic metabolic panel     Status: Abnormal   Collection Time: 10/09/14  3:45 AM  Result Value Ref Range   Sodium 142 135 - 145 mmol/L    Comment: Please note change in reference range.   Potassium 4.2 3.5 - 5.1 mmol/L    Comment:  Please note change in reference range.   Chloride 106 96 - 112 mEq/L   CO2 36 (H) 19 - 32 mmol/L   Glucose, Bld 95 70 - 99 mg/dL   BUN <5 (L) 6 - 23 mg/dL    Comment: REPEATED TO VERIFY   Creatinine, Ser 0.59 0.50 - 1.10 mg/dL   Calcium 9.2 8.4 - 10.5 mg/dL   GFR calc non Af Amer >90 >90 mL/min   GFR calc Af Amer >90 >90 mL/min    Comment: (NOTE) The eGFR has been calculated using the CKD EPI equation. This calculation has not been validated in all clinical situations. eGFR's persistently <90 mL/min signify possible Chronic Kidney Disease.    Anion gap 0 (L) 5 - 15    Observation Level/Precautions:  15 minute checks  Laboratory:  Per ED  Psychotherapy:  Group milieu  Medications:  As per medlist  Consultations:  As needed  Discharge Concerns:  Safety  Estimated LOS:  5-7 days  Other:     Psychological Evaluations: Yes   Treatment Plan Summary: Daily contact with patient to assess and evaluate symptoms and progress in treatment and Medication management  Medical Decision Making:  Review of Psycho-Social Stressors (1), Review or order clinical lab tests (1), Discuss test with performing physician (1), Decision to obtain old records (1), Review and summation of old records (2), Review of Medication Regimen & Side Effects (2) and Review of New Medication or Change in Dosage (2)  I certify that inpatient services furnished can reasonably be expected to improve the patient's condition.   Kerrie Buffalo MAY, AGNP-BC 1/21/20165:17 PM   I have reviewed patient case with NP and have met with patient. I agree with NP's Note, Assessment, Plan. Patient is a 49 year old woman, who reports a history of Bipolar Disorder, and a more remote history of Bulimia/Anorexia. She had a severe suicide attempt late last year by overdosing on acetaminophen, which required medical intervention, and at which time " came close for me to need a liver transplant ". She recently overdosed on Acetaminophen,  as per her report not due to suicidal intent but because of lack of other analgesics to take for chronic pain, following her MD " taking me off my pain meds". She does have a history of opiate dependence, but states she has been sober for several years. She presents irritable, sad, dysphoric, and describes some passive SI but no plan or intent and able to contract for safety. States that Trileptal was helpful and well tolerated in the past. She is on Prozac as well. Dx- S/P Overdose, Bipolar Disorder- Mixed Plan - will start Trileptal at 150 mgrs BID, continue Prozac 10 mgrs QDAY.  She is also on Keppra, which she states she has been tolerating well.
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
## 2014-10-10 NOTE — Progress Notes (Signed)
Recreation Therapy Notes  Animal-Assisted Activity/Therapy (AAA/T) Program Checklist/Progress Notes Patient Eligibility Criteria Checklist & Daily Group note for Rec Tx Intervention  Date: 01.21.2016 Time: 2:45pm Location: [address]    AAA/T Program Assumption of Risk Form signed by Patient/ or Parent Legal Guardian yes  Patient is free of allergies or sever asthma yes  Patient reports no fear of animals yes  Patient reports no history of cruelty to animals yes  Patient understands his/her participation is voluntary yes  Patient washes hands before animal contact yes  Patient washes hands after animal contact yes  Behavioral Response: Appropriate   Education: Hand Washing, Appropriate Animal Interaction   Education Outcome: Acknowledges education.   Clinical Observations/Feedback: Patient actively engaged with therapy dog, petting him appropriately from floor level and feeding him a treat in exchange for completion of basic obedience commands.   Laureen Ochs Pellegrino Kennard, LRT/CTRS  Trystan Eads L 10/10/2014 5:10 PM

## 2014-10-10 NOTE — [facility] Group Notes (Signed)
[facility] LCSW Group Therapy 08/21/2015 1:15 PM Type of Therapy: Group Therapy Participation Level: Active  Participation Quality: Attentive, Sharing and Supportive  Affect: Depressed and Flat  Cognitive: Alert and Oriented  Insight: Developing/Improving and Engaged  Engagement in Therapy: Developing/Improving and Engaged  Modes of Intervention: Activity, Clarification, Confrontation, Discussion, Education, Exploration, Limit-setting, Orientation, Problem-solving, Rapport Building, Reality Testing, Socialization and Support  Summary of Progress/Problems: Patient was attentive and engaged with speaker from [employer]. Patient was attentive to speaker while they shared their story of dealing with mental health and overcoming it. Patient expressed interest in their programs and services and received information on their agency. Patient processed ways they can relate to the speaker.   Brynnlee Cumpian, MSW, LCSWA Clinical Social Worker [facility] [phone]   

## 2014-10-10 NOTE — Progress Notes (Signed)
Patient ID: [name], female   DOB: February 22, 1966, 49 y.o.   MRN: [id number]  Adult Psychoeducational Group Note  Date:  10/10/2014 Time: 0905  Group Topic/Focus:  Goals Group:   The focus of this group is to help patients establish daily goals to achieve during treatment and discuss how the patient can incorporate goal setting into their daily lives to aide in recovery.  Participation Level:  Active  Participation Quality:  Appropriate  Affect:  Blunted  Cognitive:  Alert  Insight: Improving  Engagement in Group:  Defensive and Improving  Modes of Intervention:  Discussion, Education, Orientation and Support  Additional Comments: Pt actively participated in group. Pt able to identify a short term and long term goal. Pt states "I want to figure out what is going on with me, physically."  [name], [name] 10/10/2014, 11:04 AM

## 2014-10-11 DIAGNOSIS — G8929 Other chronic pain: Secondary | ICD-10-CM

## 2014-10-11 LAB — BASIC METABOLIC PANEL
ANION GAP: 7 (ref 5–15)
BUN: 9 mg/dL (ref 6–23)
CO2: 28 mmol/L (ref 19–32)
Calcium: 9.7 mg/dL (ref 8.4–10.5)
Chloride: 104 mmol/L (ref 96–112)
Creatinine, Ser: 0.84 mg/dL (ref 0.50–1.10)
GFR calc Af Amer: >90 mL/min (ref >90)
GFR calc non Af Amer: 81 mL/min — ABNORMAL LOW (ref >90)
Glucose, Bld: 157 mg/dL — ABNORMAL HIGH (ref 70–99)
POTASSIUM: 3.5 mmol/L (ref 3.5–5.1)
SODIUM: 139 mmol/L (ref 135–145)

## 2014-10-11 LAB — HEMOGLOBIN A1C
HEMOGLOBIN A1C: 5.4 % (ref <5.7)
Mean Plasma Glucose: 108 mg/dL (ref <117)

## 2014-10-11 LAB — CBC WITH DIFFERENTIAL/PLATELET
Basophils Absolute: 0 10*3/uL (ref 0.0–0.1)
Basophils Relative: 0 % (ref 0–1)
Eosinophils Absolute: 0 10*3/uL (ref 0.0–0.7)
Eosinophils Relative: 1 % (ref 0–5)
HCT: 36.1 % (ref 36.0–46.0)
Hemoglobin: 11.8 g/dL — ABNORMAL LOW (ref 12.0–15.0)
Lymphocytes Relative: 64 % — ABNORMAL HIGH (ref 12–46)
Lymphs Abs: 4.1 10*3/uL — ABNORMAL HIGH (ref 0.7–4.0)
MCH: 31.2 pg (ref 26.0–34.0)
MCHC: 32.7 g/dL (ref 30.0–36.0)
MCV: 95.5 fL (ref 78.0–100.0)
MONO ABS: 0.5 10*3/uL (ref 0.1–1.0)
MONOS PCT: 9 % (ref 3–12)
Neutro Abs: 1.7 10*3/uL (ref 1.7–7.7)
Neutrophils Relative %: 26 % — ABNORMAL LOW (ref 43–77)
Platelets: 180 10*3/uL (ref 150–400)
RBC: 3.78 MIL/uL — AB (ref 3.87–5.11)
RDW: 13.7 % (ref 11.5–15.5)
WBC: 6.3 10*3/uL (ref 4.0–10.5)

## 2014-10-11 LAB — TSH: TSH: 1.307 u[IU]/mL (ref 0.350–4.500)

## 2014-10-11 MED ORDER — FLUOXETINE HCL 10 MG PO CAPS
10.0000 mg | ORAL_CAPSULE | Freq: Every day | ORAL | Status: DC
  Administered 2014-10-12 – 2014-10-14 (×3): 10 mg via ORAL
  Filled 2014-10-11 (×5): qty 1

## 2014-10-11 MED ORDER — CLONAZEPAM 1 MG PO TABS: 1.0000 mg | ORAL_TABLET | Freq: Three times a day (TID) | ORAL | Status: DC | PRN

## 2014-10-11 MED ORDER — OXYCODONE HCL 5 MG PO TABS
5.0000 mg | ORAL_TABLET | Freq: Two times a day (BID) | ORAL | Status: DC | PRN
  Administered 2014-10-11 – 2014-10-19 (×15): 5 mg via ORAL
  Filled 2014-10-11 (×16): qty 1

## 2014-10-11 MED ORDER — CLONAZEPAM 0.5 MG PO TABS
0.5000 mg | ORAL_TABLET | Freq: Three times a day (TID) | ORAL | Status: DC | PRN
  Administered 2014-10-12 – 2014-10-16 (×10): 0.5 mg via ORAL
  Filled 2014-10-11 (×10): qty 1

## 2014-10-11 MED ORDER — CLONAZEPAM 0.5 MG PO TABS: 0.5000 mg | ORAL_TABLET | Freq: Three times a day (TID) | ORAL | Status: DC | PRN

## 2014-10-11 MED ORDER — MIRTAZAPINE 7.5 MG PO TABS
7.5000 mg | ORAL_TABLET | Freq: Every day | ORAL | Status: DC
  Administered 2014-10-11 – 2014-10-12 (×2): 7.5 mg via ORAL
  Filled 2014-10-11 (×4): qty 1

## 2014-10-11 NOTE — Plan of Care (Signed)
Problem: Ineffective individual coping Goal: LTG: Patient will report a decrease in negative feelings Outcome: Not Progressing Pt reports "I just don't care" about living tonight. Increased in despair throughout the day.

## 2014-10-11 NOTE — Progress Notes (Signed)
D: Pt denies SI/HI/AVH. Pt is pleasant and cooperative. Pt stated she was having issues with pain for years, but is planning to get that under control.   A: Pt was offered support and encouragement. Pt was given scheduled medications. Pt was encourage to attend groups. Q 15 minute checks were done for safety.    R:Pt attends groups and interacts well with peers and staff. Pt is taking medication. Pt has no complaints at this time .Pt receptive to treatment and safety maintained on unit.

## 2014-10-11 NOTE — Tx Team (Signed)
Interdisciplinary Treatment Plan Update   Date Reviewed:  10/11/2014  Time Reviewed:  9:56 AM  Progress in Treatment:   Attending groups: Yes Participating in groups: Yes Taking medication as prescribed: Yes  Tolerating medication: Yes Family/Significant other contact made:  No, but will ask patient for consent for collateral contact Patient understands diagnosis: Yes  Discussing patient identified problems/goals with staff: Yes Medical problems stabilized or resolved: Yes Denies suicidal/homicidal ideation: Yes Patient has not harmed self or others: Yes  For review of initial/current patient goals, please see plan of care.  Estimated Length of Stay:    Reasons for Continued Hospitalization:  Anxiety Depression Medication stabilization   New Problems/Goals identified:    Discharge Plan or Barriers:   Home with outpatient follow up with [facility] and [facility].  Additional Comments:  Patient and CSW reviewed patient's identified goals and treatment plan.  Patient verbalized understanding and agreed to treatment plan.   Attendees:  Patient:  10/11/2014 9:56 AM   Signature:  Gabriel Earing, MD 10/11/2014 9:56 AM  Signature: Darrol Angel, RN 10/11/2014 9:56 AM  Signature:  Marcella Dubs, RN 10/11/2014 9:56 AM  Signature: Hans Eden, RN 10/11/2014 9:56 AM  Signature:   10/11/2014 9:56 AM  Signature:  Joette Catching, LCSW 10/11/2014 9:56 AM   10/11/2014 9:56 AM   10/11/2014 9:56 AM   10/11/2014 9:56 AM   10/11/2014  9:56 AM   10/11/2014  9:56 AM   10/11/2014  9:56 AM    Scribe for Treatment Team:   Joette Catching,  10/11/2014 9:56 AM

## 2014-10-11 NOTE — Plan of Care (Signed)
Problem: Ineffective individual coping Goal: STG:Pt. will utilize relaxation techniques to reduce stress STG: Patient will utilize relaxation techniques to reduce stress levels  Outcome: Progressing Pt stated she used deep breathing to help relax today  Problem: Alteration in mood Goal: LTG-Patient reports reduction in suicidal thoughts (Patient reports reduction in suicidal thoughts and is able to verbalize a safety plan for whenever patient is feeling suicidal)  Outcome: Progressing Pt denies SI at this time

## 2014-10-11 NOTE — Progress Notes (Addendum)
[facility] MD Progress Note  10/11/2014 10:17 AM [name]  MRN:  [id number] Subjective:   Patient reports depression and irritability, which she attributes to her chronic pain. She also reports insomnia. Patient states she has chronic pain and feels that due to her history of opiate dependence, now in remission, she is " not being treated for my pain the way I need to be". Objective : I have discussed case with treatment team and have met with patient. As above, patient presents irritable, depressed, and focused on pain . She states she has chronic back and knee pain. She states  She cannot take Acetaminophen due to liver damage from recent overdose, cannot take ASA or NSAIDs due to GI bleed/ulcers, and that Tramadol caused 2 documented grand mal seizures ( for which she is now on Keppra). States she did not tolerate Cymbalta well. She does feel Trileptal is helpful and well tolerated. On unit, no disruptive behaviors, visible in day room. Mildly irritable.  Principal Problem:  Depression, Chronic Pain, History of severe acetaminophen overdose  Diagnosis:   Patient Active Problem List   Diagnosis Date Noted  . Bipolar 1 disorder, mixed, severe [F31.63] 10/09/2014  . Acute blood loss anemia [D62] 10/07/2014  . Liver failure, acute [K72.00]   . GI bleed [K92.2] 10/06/2014  . Duodenal ulcer hemorrhage [K26.4] 10/06/2014  . Vomiting [R11.10] 10/05/2014  . Acute hepatitis [K72.00] 10/05/2014  . Acute renal insufficiency [N28.9] 10/05/2014  . Seizure disorder [G40.909] 10/05/2014  . Hyperammonemia [E72.20] 10/05/2014  . Suicidal ideation [R45.851] 10/05/2014  . Bipolar disorder [F31.9] 10/01/2014  . Major depression [F32.2] 10/01/2014  . Nausea and vomiting [R11.2] 09/30/2014  . Diarrhea [R19.7] 09/30/2014  . SIRS (systemic inflammatory response syndrome) [A41.9] 07/15/2014  . Tylenol overdose [T39.1X4A] 07/13/2014  . Hypokalemia [E87.6] 07/13/2014  . Depression [F32.9] 05/25/2012   Total  Time spent with patient: 20 minutes   Past Medical History:  Past Medical History  Diagnosis Date  . Migraines   . Endometriosis   . ETOH abuse     sober 3 1/2 years  . Anorexia   . Depression   . Anxiety   . DJD (degenerative joint disease) of cervical spine   . Seizures   . PICC (peripherally inserted central catheter) in place     rt neck    Past Surgical History  Procedure Laterality Date  . Knee surgery    . Abdominal surgery    . Cholecystectomy    . Appendectomy    . Abdominal hysterectomy    . Nasal sinus surgery    . Laproscopy    . Carpal tunnel release      2010  . Esophagogastroduodenoscopy N/A 10/06/2014    Procedure: ESOPHAGOGASTRODUODENOSCOPY (EGD);  Surgeon: Inda Castle, MD;  Location: [facility];  Service: Endoscopy;  Laterality: N/A;   Family History:  Family History  Problem Relation Age of Onset  . Hypertension Mother   . Hyperlipidemia Mother   . Heart failure Father   . Cancer Other    Social History:  History  Alcohol Use No     History  Drug Use No    History   Social History  . Marital Status: Divorced    Spouse Name: N/A    Number of Children: N/A  . Years of Education: N/A   Social History Main Topics  . Smoking status: Current Every Day Smoker -- 0.25 packs/day for 20 years    Types: Cigarettes  . Smokeless tobacco:  Never Used  . Alcohol Use: No  . Drug Use: No  . Sexual Activity: None   Other Topics Concern  . None   Social History Narrative   Additional History:    Sleep: Poor  Appetite:  Fair   Assessment:   Musculoskeletal: Strength & Muscle Tone: within normal limits Gait & Station: slow but steady Patient leans: N/A   Psychiatric Specialty Exam: Physical Exam  ROS  Blood pressure 109/91, pulse 114, temperature 98 F (36.7 C), temperature source Oral, resp. rate 16, height 5' 2" (1.575 m), weight 118 lb (53.524 kg).Body mass index is 21.58 kg/(m^2).  General Appearance: Fairly Groomed  Chemical engineer::  Good  Speech:  Normal Rate  Volume:  Normal  Mood:  Depressed and Irritable  Affect:  Congruent  Thought Process:  Linear  Orientation:  Full (Time, Place, and Person)  Thought Content:  no hallucinations, no delusions, focused on pain related issues  Suicidal Thoughts:  Yes.  without intent/plan denies any thoughts of hurting self at this time and contracts for safety on unit   Homicidal Thoughts:  No  Memory:  Recent and Remote grossly intact  Judgement:  Fair  Insight:  Fair  Psychomotor Activity:  Decreased  Concentration:  Good  Recall:  Good  Fund of Knowledge:Good  Language: Good  Akathisia:  Negative  Handed:  Right  AIMS (if indicated):     Assets:  Desire for Improvement Resilience  ADL's:  Intact  Cognition: WNL  Sleep:  Number of Hours: 3.75     Current Medications: Current Facility-Administered Medications  Medication Dose Route Frequency Provider Last Rate Last Dose  . alum & mag hydroxide-simeth (MAALOX/MYLANTA) 200-200-20 MG/5ML suspension 30 mL  30 mL Oral Q4H PRN Laverle Hobby, PA-C      . clonazePAM Bobbye [place]) tablet 1 mg  1 mg Oral TID PRN Laverle Hobby, PA-C   1 mg at 10/11/14 0202  . diphenhydrAMINE (BENADRYL) capsule 25 mg  25 mg Oral Q6H PRN Laverle Hobby, PA-C   25 mg at 10/10/14 2119  . feeding supplement (ENSURE COMPLETE) (ENSURE COMPLETE) liquid 237 mL  237 mL Oral BID BM Laverle Hobby, PA-C   Stopped at 10/10/14 1400  . FLUoxetine (PROZAC) capsule 20 mg  20 mg Oral Daily Laverle Hobby, PA-C   20 mg at 10/11/14 3159  . levETIRAcetam (KEPPRA) tablet 500 mg  500 mg Oral BID Laverle Hobby, PA-C   500 mg at 10/11/14 4585  . magnesium hydroxide (MILK OF MAGNESIA) suspension 30 mL  30 mL Oral Daily PRN Laverle Hobby, PA-C      . OXcarbazepine (TRILEPTAL) tablet 150 mg  150 mg Oral BID Jenne Campus, MD   150 mg at 10/11/14 0804  . pantoprazole (PROTONIX) EC tablet 40 mg  40 mg Oral BID Laverle Hobby, PA-C   40 mg at 10/11/14  9292  . traZODone (DESYREL) tablet 50 mg  50 mg Oral QHS,MR X 1 Laverle Hobby, PA-C   50 mg at 10/09/14 2245    Lab Results:  Results for orders placed or performed during the hospital encounter of 10/09/14 (from the past 48 hour(s))  CBC with Differential     Status: Abnormal   Collection Time: 10/11/14  6:30 AM  Result Value Ref Range   WBC 6.3 4.0 - 10.5 K/uL   RBC 3.78 (L) 3.87 - 5.11 MIL/uL   Hemoglobin 11.8 (L) 12.0 - 15.0 g/dL  HCT 36.1 36.0 - 46.0 %   MCV 95.5 78.0 - 100.0 fL   MCH 31.2 26.0 - 34.0 pg   MCHC 32.7 30.0 - 36.0 g/dL   RDW 13.7 11.5 - 15.5 %   Platelets 180 150 - 400 K/uL   Neutrophils Relative % 26 (L) 43 - 77 %   Neutro Abs 1.7 1.7 - 7.7 K/uL   Lymphocytes Relative 64 (H) 12 - 46 %   Lymphs Abs 4.1 (H) 0.7 - 4.0 K/uL   Monocytes Relative 9 3 - 12 %   Monocytes Absolute 0.5 0.1 - 1.0 K/uL   Eosinophils Relative 1 0 - 5 %   Eosinophils Absolute 0.0 0.0 - 0.7 K/uL   Basophils Relative 0 0 - 1 %   Basophils Absolute 0.0 0.0 - 0.1 K/uL    Comment: Performed at [facility]    Physical Findings: AIMS: Facial and Oral Movements Muscles of Facial Expression: None, normal Lips and Perioral Area: None, normal Jaw: None, normal Tongue: None, normal,Extremity Movements Upper (arms, wrists, hands, fingers): None, normal Lower (legs, knees, ankles, toes): None, normal, Trunk Movements Neck, shoulders, hips: None, normal, Overall Severity Severity of abnormal movements (highest score from questions above): None, normal Incapacitation due to abnormal movements: None, normal Patient's awareness of abnormal movements (rate only patient's report): No Awareness, Dental Status Current problems with teeth and/or dentures?: Yes (broken teeth) Does patient usually wear dentures?: No  CIWA:    COWS:      Assessment: Patient presents irritable, dysphoric, depressed, with significant insomnia. She attributes most of the above to chronic pain, which  she states is currently not being treated. As noted, she states she cannot take Acetaminophen due to hepatic compromise, cannot take Ultram due to seizures, and cannot take ASA/NSAIDs due to history of GI bleed, hematemesis. States Oxycodone has been well tolerated  At this time would start low dose opiate, due to  Above rationale.  Agrees to Remeron for insomnia- we reviewed side effects.  Treatment Plan Summary: Daily contact with patient to assess and evaluate symptoms and progress in treatment, Medication management, Plan Continue inpatient treatment and continue medications as below Trileptal  150 mgrs BID Start Remeron 7.5  mgrs QHS for insomnia Decrease Klonopin to 0.5 mgrs TID PRN ANXIETY D/C Trazodone  Decrease Prozac to 10  mgrs QDAY Oxycodone 5 mgrs  BID PRN SEVERE PAIN- ( patient states she has taken it without side effects. Aware it may decrease seizure threshold, but on Trileptal and Keppra)  Recheck LFTs in AM  Medical Decision Making:  Review or order clinical lab tests (1), Established Problem, Worsening (2), Review of Last Therapy Session (1), Review of Medication Regimen & Side Effects (2) and Review of New Medication or Change in Dosage (2) Problem Points:  Established problem, worsening (2), Review of last therapy session (1) and Review of psycho-social stressors (1) Data Points:  Review of medication regiment & side effects (2) Review of new medications or change in dosage (2)    [name] 10/11/2014, 10:17 AM

## 2014-10-11 NOTE — Plan of Care (Signed)
Problem: Diagnosis: Increased Risk For Suicide Attempt Goal: STG-Patient Will Attend All Groups On The Unit Outcome: Progressing Pt attending groups more today than yesterday

## 2014-10-11 NOTE — Progress Notes (Signed)
Patient ID: [name], female   DOB: 1966/01/24, 49 y.o.   MRN: [id number]  Pt currently presents with a flat affect and irritable, labile behavior. Per self inventory, pt rates depression at a 9+, hopelessness 10 and anxiety 11. Pt's daily goal is " ." Pt reports poor sleep, concentration and appetite. Pt is very pessimistic about treatment and states "I need my medication otherwise I won't be able to work on my sobriety."   Pt provided with medications per providers orders. Pt's labs and vitals were monitored throughout the day. Pt supported emotionally and Probation officer encouraged pt to express concerns and questions numerous times throughout the day. Pt educated on medications.  Pt's safety ensured with 15 minute and environmental checks. Pt currently denies SI/HI and A/V hallucinations. Pt verbally agrees to seek staff if SI/HI or A/VH occurs and to consult with staff before acting on these thoughts. Writer had a one to one with pt around lunch time. Pt states she feels better now and "like I am being heard." Pt seen in the [place]u interacting with other pts. Pt given pain medication today. Pt affect much brighter.

## 2014-10-11 NOTE — Plan of Care (Signed)
Problem: Diagnosis: Increased Risk For Suicide Attempt Goal: STG-Patient Will Report Suicidal Feelings to Staff Outcome: Progressing Pt reports "a wall of depression and anxiety" to Probation officer. Pt states "I just don't know if I can do this anymore." Pt verbally agrees to contact staff before acting on thoughts of self harm.

## 2014-10-11 NOTE — Clinical Social Work Note (Signed)
Patient wants CSW to assist w referral to [facility] and to assist w discussing need for temporary housing w relatives in order to decrease social isolation post discharge.  Weekday CSW [place] notified.  Edwyna Shell, LCSW Clinical Social Worker

## 2014-10-11 NOTE — Plan of Care (Signed)
Problem: Ineffective individual coping Goal: STG-Increase in ability to manage activities of daily living Outcome: Progressing Pt took a shower and cleaned her room

## 2014-10-11 NOTE — BHH Group Notes (Signed)
[facility] LCSW Aftercare Discharge Planning Group Note   10/11/2014 9:55 AM    Participation Quality:  Appropraite  Mood/Affect:  Appropriate  Depression Rating:  8  Anxiety Rating:  8  Thoughts of Suicide:  No  Will you contract for safety?   NA  Current AVH:  No  Plan for Discharge/Comments:  Patient attended discharge planning group and actively participated in group. Patient advised of plans to return to her home and follow up with [facility] and [facility].  Suicide prevention education reviewed and SPE document provided.   Transportation Means: Patient has transportation.   Supports:  Patient has a support system.   Concha Pyo 10/11/2014     9:55 AM

## 2014-10-12 DIAGNOSIS — F329 Major depressive disorder, single episode, unspecified: Secondary | ICD-10-CM

## 2014-10-12 DIAGNOSIS — T391X4A Poisoning by 4-Aminophenol derivatives, undetermined, initial encounter: Secondary | ICD-10-CM

## 2014-10-12 DIAGNOSIS — R45851 Suicidal ideations: Secondary | ICD-10-CM

## 2014-10-12 DIAGNOSIS — G894 Chronic pain syndrome: Secondary | ICD-10-CM

## 2014-10-12 LAB — LIPID PANEL
CHOLESTEROL: 174 mg/dL (ref 0–200)
HDL: 43 mg/dL (ref >39)
LDL Cholesterol: 105 mg/dL — ABNORMAL HIGH (ref 0–99)
Total CHOL/HDL Ratio: 4 RATIO
Triglycerides: 129 mg/dL (ref <150)
VLDL: 26 mg/dL (ref 0–40)

## 2014-10-12 LAB — HEPATIC FUNCTION PANEL
ALK PHOS: 84 U/L (ref 39–117)
ALT: 194 U/L — ABNORMAL HIGH (ref 0–35)
AST: 30 U/L (ref 0–37)
Albumin: 4.1 g/dL (ref 3.5–5.2)
BILIRUBIN TOTAL: 0.7 mg/dL (ref 0.3–1.2)
Bilirubin, Direct: 0.2 mg/dL (ref 0.0–0.5)
Indirect Bilirubin: 0.5 mg/dL (ref 0.3–0.9)
TOTAL PROTEIN: 6.8 g/dL (ref 6.0–8.3)

## 2014-10-12 NOTE — Progress Notes (Signed)
Patient ID: [name], female   DOB: 03-03-1966, 49 y.o.   MRN: [id number] [facility] MD Progress Note  10/12/2014 3:47 PM VIENNA FOLDEN  MRN:  [id number] Subjective:   Patient states that she is in pain.  "I need more than the pain medicine I'm getting.  The oxycodone two times a day is not helping.  I can't take Tylenol cause of my liver, and I can't take Ibuprofen cause of my stomach.  Neurontin, Amitriptyline, and Cymbalta make me feel like a zombie.  If I have got to be in pain like this I'm just devoid of home; I can't live like this between the pain physical and emotional, can't sleep; it's just a snow ball in hell; I rather not live."  Objective : I have discussed case nursing/psychiatrist and have met with patient. As above, patient presents irritable, depressed, and focused on pain .  Patient continues to complain of pain.    Principal Problem:  Depression, Chronic Pain, History of severe acetaminophen overdose  Diagnosis:   Patient Active Problem List   Diagnosis Date Noted  . Bipolar 1 disorder, mixed, severe [F31.63] 10/09/2014  . Acute blood loss anemia [D62] 10/07/2014  . Liver failure, acute [K72.00]   . GI bleed [K92.2] 10/06/2014  . Duodenal ulcer hemorrhage [K26.4] 10/06/2014  . Vomiting [R11.10] 10/05/2014  . Acute hepatitis [K72.00] 10/05/2014  . Acute renal insufficiency [N28.9] 10/05/2014  . Seizure disorder [G40.909] 10/05/2014  . Hyperammonemia [E72.20] 10/05/2014  . Suicidal ideation [R45.851] 10/05/2014  . Bipolar disorder [F31.9] 10/01/2014  . Major depression [F32.2] 10/01/2014  . Nausea and vomiting [R11.2] 09/30/2014  . Diarrhea [R19.7] 09/30/2014  . SIRS (systemic inflammatory response syndrome) [A41.9] 07/15/2014  . Tylenol overdose [T39.1X4A] 07/13/2014  . Hypokalemia [E87.6] 07/13/2014  . Depression [F32.9] 05/25/2012   Total Time spent with patient: 20 minutes   Past Medical History:  Past Medical History  Diagnosis Date  . Migraines   .  Endometriosis   . ETOH abuse     sober 3 1/2 years  . Anorexia   . Depression   . Anxiety   . DJD (degenerative joint disease) of cervical spine   . Seizures   . PICC (peripherally inserted central catheter) in place     rt neck    Past Surgical History  Procedure Laterality Date  . Knee surgery    . Abdominal surgery    . Cholecystectomy    . Appendectomy    . Abdominal hysterectomy    . Nasal sinus surgery    . Laproscopy    . Carpal tunnel release      2010  . Esophagogastroduodenoscopy N/A 10/06/2014    Procedure: ESOPHAGOGASTRODUODENOSCOPY (EGD);  Surgeon: Inda Castle, MD;  Location: [facility];  Service: Endoscopy;  Laterality: N/A;   Family History:  Family History  Problem Relation Age of Onset  . Hypertension Mother   . Hyperlipidemia Mother   . Heart failure Father   . Cancer Other    Social History:  History  Alcohol Use No     History  Drug Use No    History   Social History  . Marital Status: Divorced    Spouse Name: N/A    Number of Children: N/A  . Years of Education: N/A   Social History Main Topics  . Smoking status: Current Every Day Smoker -- 0.25 packs/day for 20 years    Types: Cigarettes  . Smokeless tobacco: Never Used  . Alcohol Use:  No  . Drug Use: No  . Sexual Activity: None   Other Topics Concern  . None   Social History Narrative   Additional History:    Sleep: Poor  Appetite:  Fair   Assessment:   Musculoskeletal: Strength & Muscle Tone: within normal limits Gait & Station: slow but steady Patient leans: N/A   Psychiatric Specialty Exam: Physical Exam  ROS  Blood pressure 116/79, pulse 98, temperature 97.9 F (36.6 C), temperature source Oral, resp. rate 16, height $RemoveBe'5\' 2"'qxeiqOHtY$  (1.575 m), weight 53.524 kg (118 lb).Body mass index is 21.58 kg/(m^2).  General Appearance: Fairly Groomed  Engineer, water::  Good  Speech:  Normal Rate  Volume:  Normal  Mood:  Depressed and Irritable  Affect:  Congruent  Thought  Process:  Linear  Orientation:  Full (Time, Place, and Person)  Thought Content:  no hallucinations, no delusions, focused on pain related issues  Suicidal Thoughts:  Yes.  without intent/plan denies any thoughts of hurting self at this time and contracts for safety on unit   Homicidal Thoughts:  No  Memory:  Recent and Remote grossly intact  Judgement:  Fair  Insight:  Fair  Psychomotor Activity:  Decreased  Concentration:  Good  Recall:  Good  Fund of Knowledge:Good  Language: Good  Akathisia:  Negative  Handed:  Right  AIMS (if indicated):     Assets:  Desire for Improvement Resilience  ADL's:  Intact  Cognition: WNL  Sleep:  Number of Hours: 3.75     Current Medications: Current Facility-Administered Medications  Medication Dose Route Frequency Provider Last Rate Last Dose  . alum & mag hydroxide-simeth (MAALOX/MYLANTA) 200-200-20 MG/5ML suspension 30 mL  30 mL Oral Q4H PRN Laverle Hobby, PA-C      . clonazePAM [place]) tablet 0.5 mg  0.5 mg Oral TID PRN Jenne Campus, MD   0.5 mg at 10/12/14 0823  . diphenhydrAMINE (BENADRYL) capsule 25 mg  25 mg Oral Q6H PRN Laverle Hobby, PA-C   25 mg at 10/12/14 1538  . feeding supplement (ENSURE COMPLETE) (ENSURE COMPLETE) liquid 237 mL  237 mL Oral BID BM Laverle Hobby, PA-C   Stopped at 10/10/14 1400  . FLUoxetine (PROZAC) capsule 10 mg  10 mg Oral Daily Jenne Campus, MD   10 mg at 10/12/14 0818  . levETIRAcetam (KEPPRA) tablet 500 mg  500 mg Oral BID Laverle Hobby, PA-C   500 mg at 10/12/14 0818  . magnesium hydroxide (MILK OF MAGNESIA) suspension 30 mL  30 mL Oral Daily PRN Laverle Hobby, PA-C      . mirtazapine (REMERON) tablet 7.5 mg  7.5 mg Oral QHS Myer Peer Cobos, MD   7.5 mg at 10/11/14 2310  . OXcarbazepine (TRILEPTAL) tablet 150 mg  150 mg Oral BID Jenne Campus, MD   150 mg at 10/12/14 0818  . oxyCODONE (Oxy IR/ROXICODONE) immediate release tablet 5 mg  5 mg Oral Q12H PRN Jenne Campus, MD   5 mg at  10/12/14 1219  . pantoprazole (PROTONIX) EC tablet 40 mg  40 mg Oral BID Laverle Hobby, PA-C   40 mg at 10/12/14 5400    Lab Results:  Results for orders placed or performed during the hospital encounter of 10/09/14 (from the past 48 hour(s))  Basic metabolic panel     Status: Abnormal   Collection Time: 10/11/14  6:30 AM  Result Value Ref Range   Sodium 139 135 - 145 mmol/L  Potassium 3.5 3.5 - 5.1 mmol/L   Chloride 104 96 - 112 mmol/L   CO2 28 19 - 32 mmol/L   Glucose, Bld 157 (H) 70 - 99 mg/dL   BUN 9 6 - 23 mg/dL   Creatinine, Ser 0.84 0.50 - 1.10 mg/dL   Calcium 9.7 8.4 - 10.5 mg/dL   GFR calc non Af Amer 81 (L) >90 mL/min   GFR calc Af Amer >90 >90 mL/min    Comment: (NOTE) The eGFR has been calculated using the CKD EPI equation. This calculation has not been validated in all clinical situations. eGFR's persistently <90 mL/min signify possible Chronic Kidney Disease.    Anion gap 7 5 - 15    Comment: Performed at [facility]  CBC with Differential     Status: Abnormal   Collection Time: 10/11/14  6:30 AM  Result Value Ref Range   WBC 6.3 4.0 - 10.5 K/uL   RBC 3.78 (L) 3.87 - 5.11 MIL/uL   Hemoglobin 11.8 (L) 12.0 - 15.0 g/dL   HCT 36.1 36.0 - 46.0 %   MCV 95.5 78.0 - 100.0 fL   MCH 31.2 26.0 - 34.0 pg   MCHC 32.7 30.0 - 36.0 g/dL   RDW 13.7 11.5 - 15.5 %   Platelets 180 150 - 400 K/uL   Neutrophils Relative % 26 (L) 43 - 77 %   Neutro Abs 1.7 1.7 - 7.7 K/uL   Lymphocytes Relative 64 (H) 12 - 46 %   Lymphs Abs 4.1 (H) 0.7 - 4.0 K/uL   Monocytes Relative 9 3 - 12 %   Monocytes Absolute 0.5 0.1 - 1.0 K/uL   Eosinophils Relative 1 0 - 5 %   Eosinophils Absolute 0.0 0.0 - 0.7 K/uL   Basophils Relative 0 0 - 1 %   Basophils Absolute 0.0 0.0 - 0.1 K/uL    Comment: Performed at [facility]  TSH     Status: None   Collection Time: 10/11/14  7:48 AM  Result Value Ref Range   TSH 1.307 0.350 - 4.500 uIU/mL    Comment: Performed  at [facility]  Hemoglobin A1c     Status: None   Collection Time: 10/11/14  7:48 AM  Result Value Ref Range   Hgb A1c MFr Bld 5.4 <5.7 %    Comment: (NOTE)                                                                       According to the ADA Clinical Practice Recommendations for 2011, when HbA1c is used as a screening test:  >=6.5%   Diagnostic of Diabetes Mellitus           (if abnormal result is confirmed) 5.7-6.4%   Increased risk of developing Diabetes Mellitus References:Diagnosis and Classification of Diabetes Mellitus,Diabetes Care,2011,34(Suppl 1):S62-S69 and Standards of Medical Care in         Diabetes - 2011,Diabetes Care,2011,34 (Suppl 1):S11-S61.    Mean Plasma Glucose 108 <117 mg/dL    Comment: Performed at [facility]  Lipid panel     Status: Abnormal   Collection Time: 10/12/14  6:30 AM  Result Value Ref Range   Cholesterol 174 0 - 200  mg/dL   Triglycerides 129 <150 mg/dL   HDL 43 >39 mg/dL   Total CHOL/HDL Ratio 4.0 RATIO   VLDL 26 0 - 40 mg/dL   LDL Cholesterol 105 (H) 0 - 99 mg/dL    Comment:        Total Cholesterol/HDL:CHD Risk Coronary Heart Disease Risk Table                     Men   Women  1/2 Average Risk   3.4   3.3  Average Risk       5.0   4.4  2 X Average Risk   9.6   7.1  3 X Average Risk  23.4   11.0        Use the calculated Patient Ratio above and the CHD Risk Table to determine the patient's CHD Risk.        ATP III CLASSIFICATION (LDL):  <100     mg/dL   Optimal  100-129  mg/dL   Near or Above                    Optimal  130-159  mg/dL   Borderline  160-189  mg/dL   High  >190     mg/dL   Very High Performed at [facility]   Hepatic function panel     Status: Abnormal   Collection Time: 10/12/14  6:30 AM  Result Value Ref Range   Total Protein 6.8 6.0 - 8.3 g/dL   Albumin 4.1 3.5 - 5.2 g/dL   AST 30 0 - 37 U/L   ALT 194 (H) 0 - 35 U/L   Alkaline Phosphatase 84 39 - 117 U/L   Total Bilirubin 0.7  0.3 - 1.2 mg/dL   Bilirubin, Direct 0.2 0.0 - 0.5 mg/dL    Comment: Please note change in reference range.   Indirect Bilirubin 0.5 0.3 - 0.9 mg/dL    Comment: Performed at [facility]    Physical Findings: AIMS: Facial and Oral Movements Muscles of Facial Expression: None, normal Lips and Perioral Area: None, normal Jaw: None, normal Tongue: None, normal,Extremity Movements Upper (arms, wrists, hands, fingers): None, normal Lower (legs, knees, ankles, toes): None, normal, Trunk Movements Neck, shoulders, hips: None, normal, Overall Severity Severity of abnormal movements (highest score from questions above): None, normal Incapacitation due to abnormal movements: None, normal Patient's awareness of abnormal movements (rate only patient's report): No Awareness, Dental Status Current problems with teeth and/or dentures?: Yes (broken teeth) Does patient usually wear dentures?: No  CIWA:    COWS:      Assessment: Patient presents irritable, dysphoric, depressed, with significant insomnia. She attributes most of the above to chronic pain, which she states is currently not being treated. As noted, she states she cannot take Acetaminophen due to hepatic compromise, cannot take Ultram due to seizures, and cannot take ASA/NSAIDs due to history of GI bleed, hematemesis. States Oxycodone has been well tolerated  At this time would start low dose opiate, due to  Above rationale.  Agrees to Remeron for insomnia- we reviewed side effects.  Treatment Plan Summary: Daily contact with patient to assess and evaluate symptoms and progress in treatment, Medication management, Plan Continue inpatient treatment and continue medications as below Trileptal  150 mgrs BID Start Remeron 7.5  mgrs QHS for insomnia Decrease Klonopin to 0.5 mgrs TID PRN ANXIETY D/C Trazodone  Decrease Prozac to 10  mgrs QDAY Oxycodone 5 mgrs  BID PRN SEVERE PAIN- ( patient states she has taken it without  side effects. Aware it may decrease seizure threshold, but on Trileptal and Keppra)  Recheck LFTs in AM  Will continue with current treatment plan.  Medical Decision Making:  Review or order clinical lab tests (1), Established Problem, Worsening (2), Review of Last Therapy Session (1), Review of Medication Regimen & Side Effects (2) and Review of New Medication or Change in Dosage (2) Problem Points:  Established problem, worsening (2), Review of last therapy session (1) and Review of psycho-social stressors (1) Data Points:  Review of medication regiment & side effects (2) Review of new medications or change in dosage (2)    Rankin, Shuvon, FNP-BC 10/12/2014, 3:47 PM  I agreed with the findings, treatment and disposition plan of this patient. Berniece Andreas, MD

## 2014-10-12 NOTE — BHH Group Notes (Signed)
[facility] Group Notes:  Coping skills  Date:  10/12/2014  Time:  10:00  Type of Therapy:  Nurse Education  Participation Level:  Did Not Attend  Participation Quality:  Inattentive  Affect:  Blunted  Cognitive:  Lacking  Insight:  None  Engagement in Group:  None  Modes of Intervention:  Discussion  Summary of Progress/Problems:Pt remained in her room asleep  Marcello Moores [facility] 10/12/2014, 10:14 AM

## 2014-10-12 NOTE — BHH Group Notes (Signed)
[facility] LCSW Group Therapy Note  10/12/2014 / 11:15 AM  Type of Therapy and Topic:  Group Therapy: Avoiding Self-Sabotaging and Enabling Behaviors  Participation Level:  Active  Therapeutic Goals: 1. Patient will identify one obstacle that relates to self-sabotage and enabling behaviors 2. Patient will identify one personal self-sabotaging or enabling behavior they did prior to admission 3. Patient able to establish a plan to change the above identified behavior they did prior to admission:  4. Patient will demonstrate ability to communicate their needs through discussion and/or role plays.   Summary of Patient Progress: The main focus of today's process group was to discuss what "self-sabotage" means and use Motivational Interviewing to discuss what benefits, negative or positive, were involved in a self-identified self-sabotaging behavior. We then talked about reasons the patient may want to change the behavior and current desire to change. The patient identified with self sabotaging in the form of "giving up." Patient presented with anger and resentment and reports she is angry due to not being given her back pain medication. . [name] was engaged in group process as long as she was able to complain but when redirected to what she is willing to change she disengaged as evidenced by shutting her eyes and not speaking. [name] reports she has nothing to look forward to.     Therapeutic Modalities:   Cognitive Behavioral Therapy Person-Centered Therapy Motivational Interviewing   Sheilah Pigeon, LCSW

## 2014-10-12 NOTE — Progress Notes (Addendum)
Pt is very argumentative this am. She stated ,"I do not like the doctor I had yesterday. He continually laughed at me." Pt was reassured that the staff is here to help her. Pt would like to be placed on clonipine and stated,"remeron does nothing for me." Pt did not attend group. She does contract for safety and denies SI and HI. Pt rates her depression a 7 and her anxiety an 8/10. Pt wants to live for the moment and remain positive and engaged. Her affect is blunted , depressed and angry. Pt does contract for safety and denies SI and HI. Pt asked for .5mg  of clonidine for her nerves. This was given to her at 8:23am.

## 2014-10-12 NOTE — Progress Notes (Signed)
D: Pt denies SI/HI/AVh. Pt is pleasant and cooperative. Pt continues to complain about her hives and how they itch, pt was given Benadryl.   A: Pt was offered support and encouragement. Pt was given scheduled medications. Pt was encourage to attend groups. Q 15 minute checks were done for safety.   R:Pt attends groups and interacts well with peers and staff. Pt is taking medication. Pt receptive to treatment and safety maintained on unit.

## 2014-10-12 NOTE — Progress Notes (Signed)
[facility] Group Notes:  (Nursing/MHT/Case Management/Adjunct)  Date:  10/12/2014  Time:  10:10 PM  Type of Therapy:  Psychoeducational Skills  Participation Level:  Active  Participation Quality:  Appropriate  Affect:  Flat  Cognitive:  Appropriate  Insight:  Improving  Engagement in Group:  Improving  Modes of Intervention:  Education  Summary of Progress/Problems: The patient shared in group that she had a better day as compared to yesterday. She feels that she will be discharged soon and that taking a two hour nap helped with her feeling better. As a theme for the day, her support system will consist of her sponsor and [place], Marland Kitchen S 10/12/2014, 10:10 PM

## 2014-10-12 NOTE — Plan of Care (Signed)
Problem: Ineffective individual coping Goal: LTG: Patient will report a decrease in negative feelings Outcome: Progressing Pt denies being upset or depressed, but pt appears to get upset when she does not get what she wants Goal: STG: Patient will remain free from self harm Outcome: Progressing Pt continues to be safe on the unit.

## 2014-10-13 DIAGNOSIS — G894 Chronic pain syndrome: Secondary | ICD-10-CM | POA: Insufficient documentation

## 2014-10-13 DIAGNOSIS — G4700 Insomnia, unspecified: Secondary | ICD-10-CM | POA: Insufficient documentation

## 2014-10-13 MED ORDER — DOXEPIN HCL 25 MG PO CAPS
25.0000 mg | ORAL_CAPSULE | Freq: Every evening | ORAL | Status: DC | PRN
  Administered 2014-10-13: 25 mg via ORAL
  Filled 2014-10-13: qty 1

## 2014-10-13 MED ORDER — HYDROCERIN EX CREA
TOPICAL_CREAM | Freq: Two times a day (BID) | CUTANEOUS | Status: DC
Start: 2014-10-13 — End: 2014-10-19
  Administered 2014-10-13 – 2014-10-19 (×10): via TOPICAL
  Filled 2014-10-13: qty 113

## 2014-10-13 MED ORDER — HYDROXYZINE HCL 50 MG PO TABS
50.0000 mg | ORAL_TABLET | Freq: Three times a day (TID) | ORAL | Status: DC | PRN
  Administered 2014-10-13 – 2014-10-19 (×14): 50 mg via ORAL
  Filled 2014-10-13 (×14): qty 1

## 2014-10-13 NOTE — BHH Group Notes (Signed)
[facility] Group Notes:  Coping skills  Date:  10/13/2014  Time:  10:16 AM  Type of Therapy:  Nurse Education  Participation Level:  Active  Participation Quality:  Appropriate  Affect:  Appropriate  Cognitive:  Appropriate  Insight:  Appropriate  Engagement in Group:  Engaged  Modes of Intervention:  Education  Summary of Progress/Problems:pt stated she is in a black hole and is trying to get out by getting help to stop drinking.  Marcello Moores [facility] 10/13/2014, 10:16 AM

## 2014-10-13 NOTE — Progress Notes (Addendum)
Pt appears anxious this am. She stated,"I was up a lot last pm just crying." I feel I need clonipine for sleep theophylline remeron does not work at all." Pt has been in the dayroom with the other pts. Conversing. She does contract for safety and denies SI or HI. Pt states her depression is a 9/10 and her anxiety is a 9/10. Pt states she feels overwhelmed and very sad. She is considering moving in with her sister so she does not feel so lonely. Pt would help contacting her sister.

## 2014-10-13 NOTE — Progress Notes (Signed)
Patient ID: [name], female   DOB: 1966/04/05, 49 y.o.   MRN: [id number]  [facility] MD Progress Note  10/13/2014 1:50 PM [name]  MRN:  [id number] Subjective:   Patient states that things are not going very well. Not sleeping, Remeron doesn't work and Trazodone gives nightmares.  Continues to complain of pain states that she is unable to do the lidocaine patch related to allergy (throat swelling. "I've been crying yesterday and I don't really have any hope and I've never just been hopeless.  The pain cause physical and emotion and I just feel like everything is just spiraling out of control and I don't know what I'm suppose to do.   I get overwhelmed with anxiety, the panicking; I just don't know how to stop it."  Objective : Chart reviewed, discussed case nursing/psychiatrist and have met with patient.  Patient continue to complain of pain with wide range of allergies or unable to take certain medications.      Principal Problem:  Depression, Chronic Pain, History of severe acetaminophen overdose  Diagnosis:   Patient Active Problem List   Diagnosis Date Noted  . Bipolar 1 disorder, mixed, severe [F31.63] 10/09/2014  . Acute blood loss anemia [D62] 10/07/2014  . Liver failure, acute [K72.00]   . GI bleed [K92.2] 10/06/2014  . Duodenal ulcer hemorrhage [K26.4] 10/06/2014  . Vomiting [R11.10] 10/05/2014  . Acute hepatitis [K72.00] 10/05/2014  . Acute renal insufficiency [N28.9] 10/05/2014  . Seizure disorder [G40.909] 10/05/2014  . Hyperammonemia [E72.20] 10/05/2014  . Suicidal ideation [R45.851] 10/05/2014  . Bipolar disorder [F31.9] 10/01/2014  . Major depression [F32.2] 10/01/2014  . Nausea and vomiting [R11.2] 09/30/2014  . Diarrhea [R19.7] 09/30/2014  . SIRS (systemic inflammatory response syndrome) [A41.9] 07/15/2014  . Tylenol overdose [T39.1X4A] 07/13/2014  . Hypokalemia [E87.6] 07/13/2014  . Depression [F32.9] 05/25/2012   Total Time spent with patient: 20  minutes   Past Medical History:  Past Medical History  Diagnosis Date  . Migraines   . Endometriosis   . ETOH abuse     sober 3 1/2 years  . Anorexia   . Depression   . Anxiety   . DJD (degenerative joint disease) of cervical spine   . Seizures   . PICC (peripherally inserted central catheter) in place     rt neck    Past Surgical History  Procedure Laterality Date  . Knee surgery    . Abdominal surgery    . Cholecystectomy    . Appendectomy    . Abdominal hysterectomy    . Nasal sinus surgery    . Laproscopy    . Carpal tunnel release      2010  . Esophagogastroduodenoscopy N/A 10/06/2014    Procedure: ESOPHAGOGASTRODUODENOSCOPY (EGD);  Surgeon: Inda Castle, MD;  Location: [place];  Service: Endoscopy;  Laterality: N/A;   Family History:  Family History  Problem Relation Age of Onset  . Hypertension Mother   . Hyperlipidemia Mother   . Heart failure Father   . Cancer Other    Social History:  History  Alcohol Use No     History  Drug Use No    History   Social History  . Marital Status: Divorced    Spouse Name: N/A    Number of Children: N/A  . Years of Education: N/A   Social History Main Topics  . Smoking status: Current Every Day Smoker -- 0.25 packs/day for 20 years    Types: Cigarettes  .  Smokeless tobacco: Never Used  . Alcohol Use: No  . Drug Use: No  . Sexual Activity: None   Other Topics Concern  . None   Social History Narrative   Additional History:    Sleep: Poor related to pain  Appetite:  Fair   Assessment:   Musculoskeletal: Strength & Muscle Tone: within normal limits Gait & Station: slow but steady Patient leans: N/A   Psychiatric Specialty Exam: Physical Exam  ROS  Blood pressure 107/78, pulse 97, temperature 98.3 F (36.8 C), temperature source Oral, resp. rate 16, height _0  (1.575 m), weight 53.524 kg (118 lb).Body mass index is 21.58 kg/(m^2).  General Appearance: Fairly Groomed  Engineer, water::   Good  Speech:  Normal Rate  Volume:  Normal  Mood:  Depressed and Irritable  Affect:  Congruent  Thought Process:  Linear  Orientation:  Full (Time, Place, and Person)  Thought Content:  Denies hallucinations and paranoia  Suicidal Thoughts:  Yes.  without intent/plan   Homicidal Thoughts:  No  Memory:  Recent and Remote grossly intact  Judgement:  Fair  Insight:  Fair  Psychomotor Activity:  Decreased  Concentration:  Good  Recall:  Good  Fund of Knowledge:Good  Language: Good  Akathisia:  Negative  Handed:  Right  AIMS (if indicated):     Assets:  Desire for Improvement Resilience  ADL's:  Intact  Cognition: WNL  Sleep:  Number of Hours: 4.25     Current Medications: Current Facility-Administered Medications  Medication Dose Route Frequency Provider Last Rate Last Dose  . alum & mag hydroxide-simeth (MAALOX/MYLANTA) 200-200-20 MG/5ML suspension 30 mL  30 mL Oral Q4H PRN Laverle Hobby, PA-C      . clonazePAM (KLONOPIN) tablet 0.5 mg  0.5 mg Oral TID PRN Jenne Campus, MD   0.5 mg at 10/13/14 1134  . feeding supplement (ENSURE COMPLETE) (ENSURE COMPLETE) liquid 237 mL  237 mL Oral BID BM Laverle Hobby, PA-C   Stopped at 10/10/14 1400  . FLUoxetine (PROZAC) capsule 10 mg  10 mg Oral Daily Jenne Campus, MD   10 mg at 10/13/14 0804  . hydrocerin (EUCERIN) cream   Topical BID Janett Labella, NP      . hydrOXYzine (ATARAX/VISTARIL) tablet 50 mg  50 mg Oral TID PRN Janett Labella, NP   50 mg at 10/13/14 1134  . levETIRAcetam (KEPPRA) tablet 500 mg  500 mg Oral BID Laverle Hobby, PA-C   500 mg at 10/13/14 0805  . magnesium hydroxide (MILK OF MAGNESIA) suspension 30 mL  30 mL Oral Daily PRN Laverle Hobby, PA-C      . mirtazapine (REMERON) tablet 7.5 mg  7.5 mg Oral QHS Myer Peer Cobos, MD   7.5 mg at 10/12/14 2229  . OXcarbazepine (TRILEPTAL) tablet 150 mg  150 mg Oral BID Jenne Campus, MD   150 mg at 10/13/14 0805  . oxyCODONE (Oxy IR/ROXICODONE)  immediate release tablet 5 mg  5 mg Oral Q12H PRN Jenne Campus, MD   5 mg at 10/13/14 1137  . pantoprazole (PROTONIX) EC tablet 40 mg  40 mg Oral BID Laverle Hobby, PA-C   40 mg at 10/13/14 6546    Lab Results:  Results for orders placed or performed during the hospital encounter of 10/09/14 (from the past 48 hour(s))  Lipid panel     Status: Abnormal   Collection Time: 10/12/14  6:30 AM  Result Value Ref Range  Cholesterol 174 0 - 200 mg/dL   Triglycerides 129 <150 mg/dL   HDL 43 >39 mg/dL   Total CHOL/HDL Ratio 4.0 RATIO   VLDL 26 0 - 40 mg/dL   LDL Cholesterol 105 (H) 0 - 99 mg/dL    Comment:        Total Cholesterol/HDL:CHD Risk Coronary Heart Disease Risk Table                     Men   Women  1/2 Average Risk   3.4   3.3  Average Risk       5.0   4.4  2 X Average Risk   9.6   7.1  3 X Average Risk  23.4   11.0        Use the calculated Patient Ratio above and the CHD Risk Table to determine the patient's CHD Risk.        ATP III CLASSIFICATION (LDL):  <100     mg/dL   Optimal  100-129  mg/dL   Near or Above                    Optimal  130-159  mg/dL   Borderline  160-189  mg/dL   High  >190     mg/dL   Very High Performed at [facility]   Hepatic function panel     Status: Abnormal   Collection Time: 10/12/14  6:30 AM  Result Value Ref Range   Total Protein 6.8 6.0 - 8.3 g/dL   Albumin 4.1 3.5 - 5.2 g/dL   AST 30 0 - 37 U/L   ALT 194 (H) 0 - 35 U/L   Alkaline Phosphatase 84 39 - 117 U/L   Total Bilirubin 0.7 0.3 - 1.2 mg/dL   Bilirubin, Direct 0.2 0.0 - 0.5 mg/dL    Comment: Please note change in reference range.   Indirect Bilirubin 0.5 0.3 - 0.9 mg/dL    Comment: Performed at [facility]    Physical Findings: AIMS: Facial and Oral Movements Muscles of Facial Expression: None, normal Lips and Perioral Area: None, normal Jaw: None, normal Tongue: None, normal,Extremity Movements Upper (arms, wrists, hands, fingers):  None, normal Lower (legs, knees, ankles, toes): None, normal, Trunk Movements Neck, shoulders, hips: None, normal, Overall Severity Severity of abnormal movements (highest score from questions above): None, normal Incapacitation due to abnormal movements: None, normal Patient's awareness of abnormal movements (rate only patient's report): No Awareness, Dental Status Current problems with teeth and/or dentures?: Yes (broken teeth) Does patient usually wear dentures?: No  CIWA:    COWS:      Assessment:   Patient presents irritable, dysphoric, depressed, with significant insomnia. She attributes most of the above to chronic pain, which she states is currently not being treated. As noted, she states she cannot take Acetaminophen due to hepatic compromise, cannot take Ultram due to seizures, and cannot take ASA/NSAIDs due to history of GI bleed, hematemesis. States Oxycodone has been well tolerated  At this time would start low dose opiate, due to  Above rationale.  Agrees to Remeron for insomnia- we reviewed side effects.  Treatment Plan Summary: Daily contact with patient to assess and evaluate symptoms and progress in treatment, Medication management, Plan Continue inpatient treatment and continue medications as below Trileptal  150 mgrs BID Start Remeron 7.5  mgrs QHS for insomnia Decrease Klonopin to 0.5 mgrs TID PRN ANXIETY D/C Trazodone  Decrease Prozac to  10  mgrs QDAY Oxycodone 5 mgrs  BID PRN SEVERE PAIN- ( patient states she has taken it without side effects. Aware it may decrease seizure threshold, but on Trileptal and Keppra)  Recheck LFTs in AM  Will continue with current treatment plan.  Remeron discontinued and Sinequan 25 mg started for insomnia.  May want to consider pain management consult.    Medical Decision Making:  Established Problem, Worsening (2), Review of Last Therapy Session (1), Review of Medication Regimen & Side Effects (2) and Review of New Medication or  Change in Dosage (2) Problem Points:  Established problem, worsening (2), Review of last therapy session (1) and Review of psycho-social stressors (1) Data Points:  Review of medication regiment & side effects (2) Review of new medications or change in dosage (2)    Rankin, Shuvon, FNP-BC 10/13/2014, 1:50 PM  I agreed with the findings, treatment and disposition plan of this patient. Berniece Andreas, MD

## 2014-10-13 NOTE — BHH Group Notes (Signed)
[place] LCSW Group Therapy  10/13/2014   1: 15 PM   Type of Therapy:  Group Therapy  Participation Level:  Active  Participation Quality:  Appropriate and Attentive  Affect:  Appropriate, Flat and Depressed  Cognitive:  Alert and Appropriate  Insight:  Developing/Improving and Engaged  Engagement in Therapy:  Developing/Improving and Engaged  Modes of Intervention:  Clarification, Confrontation, Discussion, Education, Exploration, Limit-setting, Orientation, Problem-solving, Rapport Building, Art therapist, Socialization and Support  Summary of Progress/Problems: The main focus of today's process group was to identify the patient's current support system and decide on other supports that can be put in place.  An emphasis was placed on using counselor, doctor, therapy groups, 12-step groups, and problem-specific support groups to expand supports, as well as doing something different than has been done before. Pt processed having to deal with her family due to caring for her sick mother, but learning to set boundaries with her sisters by meeting with them in group settings.  Pt states that her sponsor, women in her network, hospice social worker and coworkers are supportive.  Pt discussed wanting to contact her sister to talk about staying with her at d/c but wants a CSW to help mediate this discussion.     Regan Lemming, LCSW 10/13/2014 2:58 PM

## 2014-10-13 NOTE — BHH Group Notes (Signed)
[place] Group Notes:  Coping skills  Date:  10/13/2014  Time:  10:00  Type of Therapy:  Nurse Education  Participation Level:  Active  Participation Quality:  Appropriate  Affect:  Appropriate  Cognitive:  Appropriate  Insight:  Appropriate  Engagement in Group:  Engaged  Modes of Intervention:  Discussion  Summary of Progress/Problems:Pt stated she feels very lonely and does not know how to get out of her black hole.   Marcello Moores [facility] 10/13/2014, 11:23 AM

## 2014-10-13 NOTE — Progress Notes (Signed)
The patient attended the evening Wrap-Up group and was appropriate.  

## 2014-10-14 DIAGNOSIS — F332 Major depressive disorder, recurrent severe without psychotic features: Secondary | ICD-10-CM

## 2014-10-14 DIAGNOSIS — G4700 Insomnia, unspecified: Secondary | ICD-10-CM

## 2014-10-14 DIAGNOSIS — F411 Generalized anxiety disorder: Secondary | ICD-10-CM

## 2014-10-14 MED ORDER — METHOCARBAMOL 500 MG PO TABS
500.0000 mg | ORAL_TABLET | Freq: Four times a day (QID) | ORAL | Status: DC | PRN
  Administered 2014-10-14: 500 mg via ORAL
  Filled 2014-10-14: qty 1

## 2014-10-14 MED ORDER — FLUOXETINE HCL 20 MG PO CAPS
20.0000 mg | ORAL_CAPSULE | Freq: Every day | ORAL | Status: DC
  Administered 2014-10-15 – 2014-10-19 (×5): 20 mg via ORAL
  Filled 2014-10-14 (×2): qty 1
  Filled 2014-10-14: qty 14
  Filled 2014-10-14 (×3): qty 1
  Filled 2014-10-14: qty 14
  Filled 2014-10-14: qty 1

## 2014-10-14 MED ORDER — CARISOPRODOL 350 MG PO TABS
350.0000 mg | ORAL_TABLET | Freq: Every day | ORAL | Status: DC
  Administered 2014-10-14: 350 mg via ORAL
  Filled 2014-10-14: qty 1

## 2014-10-14 NOTE — BHH Group Notes (Signed)
[facility] LCSW Aftercare Discharge Planning Group Note   10/14/2014 9:22 AM     Participation Quality: Patient did not attend group.   Concha Pyo  10/14/2014  9:22 AM

## 2014-10-14 NOTE — BHH Group Notes (Signed)
   [facility] LCSW Aftercare Discharge Planning Group Note  10/14/2014  8:45 AM   Participation Quality: Alert, Appropriate and Oriented  Mood/Affect: Depressed and Flat; agitated  Depression Rating: 7  Anxiety Rating: 8  Thoughts of Suicide: Pt denies SI/HI at this time  Will you contract for safety? Yes  Current AVH: Pt denies  Plan for Discharge/Comments: Pt attended discharge planning group and actively participated in group. CSW provided pt with today's workbook. Patient reports feeling "frustrated" today due to issues with staff and her medications. She reports feeling that her depression is getting worse during course of stay and reports that she continues to feel hopeless. She states that she does not feel that returning to her condo alone is a good idea and would like CSW assistance in speaking with her sister.  Transportation Means: Pt reports access to transportation  Supports: Patient has identified her [place] sponsor as supportive.  Tilden Fossa, MSW, [place] Worker [facility] [phone]

## 2014-10-14 NOTE — Progress Notes (Signed)
D: Pt denies SI/HI/AVH. Pt is pleasant and cooperative. Pt stated felt a little better, but felt very anxious, sad and fearful. Pt stated she was tired of feeling that way.   A: Pt was offered support and encouragement. Pt was given scheduled medications. Pt was encourage to attend groups. Q 15 minute checks were done for safety.   R:Pt attends groups and interacts well with peers and staff. Pt is taking medication. Pt has no complaints at this time .Pt receptive to treatment and safety maintained on unit.

## 2014-10-14 NOTE — BHH Suicide Risk Assessment (Signed)
[place] INPATIENT:  Family/Significant Other Suicide Prevention Education  Suicide Prevention Education:  Education Completed; Sister Delsa Sale [phone],  (name of family member/significant other) has been identified by the patient as the family member/significant other with whom the patient will be residing, and identified as the person(s) who will aid the patient in the event of a mental health crisis (suicidal ideations/suicide attempt).  With written consent from the patient, the family member/significant other has been provided the following suicide prevention education, prior to the and/or following the discharge of the patient.  The suicide prevention education provided includes the following:  Suicide risk factors  Suicide prevention and interventions  National Suicide Hotline telephone number  [facility] assessment telephone number  [facility] Emergency Assistance [place] and/or Residential Mobile Crisis Unit telephone number  Request made of family/significant other to:  Remove weapons (e.g., guns, rifles, knives), all items previously/currently identified as safety concern.    Remove drugs/medications (over-the-counter, prescriptions, illicit drugs), all items previously/currently identified as a safety concern.  The family member/significant other verbalizes understanding of the suicide prevention education information provided.  The family member/significant other agrees to remove the items of safety concern listed above.  [name], Casimiro Needle 10/14/2014, 5:18 PM

## 2014-10-14 NOTE — BHH Group Notes (Signed)
[place] LCSW Group Therapy 10/14/2014  1:15 pm  Type of Therapy: Group Therapy Participation Level: Active  Participation Quality: Attentive, Sharing and Supportive  Affect: Depressed and Flat, tearful  Cognitive: Alert and Oriented  Insight: Developing/Improving and Engaged  Engagement in Therapy: Developing/Improving and Engaged  Modes of Intervention: Clarification, Confrontation, Discussion, Education, Exploration,  Limit-setting, Orientation, Problem-solving, Rapport Building, Art therapist, Socialization and Support  Summary of Progress/Problems: Pt identified obstacles faced currently and processed barriers involved in overcoming these obstacles. Pt identified steps necessary for overcoming these obstacles and explored motivation (internal and external) for facing these difficulties head on. Pt further identified one area of concern in their lives and chose a goal to focus on for today. Patient identified her seasonal affective disorder, medications, and her critically ill mother as obstacles. CSW explored patient's feelings of guilt and loss related to her mother's illness. Patient became tearful during discussion. CSW's and other group members provided emotional support and encouragement.  Tilden Fossa, MSW, [place] Worker [facility] [phone]

## 2014-10-14 NOTE — Progress Notes (Signed)
[facility] MD Progress Note  10/14/2014 5:34 PM [name]  MRN:  [id number] Subjective:  States he has been feeling extremely overwhelmed. He mother will be back in Hospice. She deals with her on going chronic pain. States that she was in the liver transplant list after she took the Tylenol OD but her liver is now better. But states the psychiatrist there told her MD here to take her off all her medications. She states she cant sleep she has been taking Soma for a long time as well as Klonopin. She states that now she is without insurance and she does not know how she is going to be able to have medical care. She has an apartment and states that there she feels  depressed, isolates and starts thinking suicide. Would like to be able to stay with her sister not to be alone Principal Problem: Major Depression recurrent Severe without psychotic features, GAD Diagnosis:   Patient Active Problem List   Diagnosis Date Noted  . Insomnia [G47.00]   . Chronic pain syndrome [G89.4]   . Bipolar 1 disorder, mixed, severe [F31.63] 10/09/2014  . Acute blood loss anemia [D62] 10/07/2014  . Liver failure, acute [K72.00]   . GI bleed [K92.2] 10/06/2014  . Duodenal ulcer hemorrhage [K26.4] 10/06/2014  . Vomiting [R11.10] 10/05/2014  . Acute hepatitis [K72.00] 10/05/2014  . Acute renal insufficiency [N28.9] 10/05/2014  . Seizure disorder [G40.909] 10/05/2014  . Hyperammonemia [E72.20] 10/05/2014  . Suicidal ideation [R45.851] 10/05/2014  . Bipolar disorder [F31.9] 10/01/2014  . Major depression [F32.2] 10/01/2014  . Nausea and vomiting [R11.2] 09/30/2014  . Diarrhea [R19.7] 09/30/2014  . SIRS (systemic inflammatory response syndrome) [A41.9] 07/15/2014  . Tylenol overdose [T39.1X4A] 07/13/2014  . Hypokalemia [E87.6] 07/13/2014  . Depression [F32.9] 05/25/2012   Total Time spent with patient: 30 minutes   Past Medical History:  Past Medical History  Diagnosis Date  . Migraines   . Endometriosis   . ETOH  abuse     sober 3 1/2 years  . Anorexia   . Depression   . Anxiety   . DJD (degenerative joint disease) of cervical spine   . Seizures   . PICC (peripherally inserted central catheter) in place     rt neck    Past Surgical History  Procedure Laterality Date  . Knee surgery    . Abdominal surgery    . Cholecystectomy    . Appendectomy    . Abdominal hysterectomy    . Nasal sinus surgery    . Laproscopy    . Carpal tunnel release      2010  . Esophagogastroduodenoscopy N/A 10/06/2014    Procedure: ESOPHAGOGASTRODUODENOSCOPY (EGD);  Surgeon: Inda Castle, MD;  Location: [place];  Service: Endoscopy;  Laterality: N/A;   Family History:  Family History  Problem Relation Age of Onset  . Hypertension Mother   . Hyperlipidemia Mother   . Heart failure Father   . Cancer Other    Social History:  History  Alcohol Use No     History  Drug Use No    History   Social History  . Marital Status: Divorced    Spouse Name: N/A    Number of Children: N/A  . Years of Education: N/A   Social History Main Topics  . Smoking status: Current Every Day Smoker -- 0.25 packs/day for 20 years    Types: Cigarettes  . Smokeless tobacco: Never Used  . Alcohol Use: No  . Drug Use:  No  . Sexual Activity: None   Other Topics Concern  . None   Social History Narrative   Additional History:    Sleep: Poor  Appetite:  Fair   Assessment:   Musculoskeletal: Strength & Muscle Tone: within normal limits Gait & Station: normal Patient leans: N/A   Psychiatric Specialty Exam: Physical Exam  Review of Systems  Constitutional: Negative.   HENT: Negative.   Eyes: Negative.   Respiratory: Negative.   Cardiovascular: Negative.   Gastrointestinal: Negative.   Genitourinary: Negative.   Musculoskeletal: Positive for back pain and joint pain.  Skin: Negative.   Neurological: Negative.   Endo/Heme/Allergies: Negative.   Psychiatric/Behavioral: Positive for depression. The  patient is nervous/anxious and has insomnia.     Blood pressure 103/68, pulse 108, temperature 97.5 F (36.4 C), temperature source Oral, resp. rate 18, height 5\' 2"  (1.575 m), weight 53.524 kg (118 lb).Body mass index is 21.58 kg/(m^2).  General Appearance: Fairly Groomed  Engineer, water::  Fair  Speech:  Clear and Coherent, Slow and not spontaneous  Volume:  Decreased  Mood:  Anxious and Depressed  Affect:  Depressed and anxious worried  Thought Process:  Coherent and Goal Directed  Orientation:  Full (Time, Place, and Person)  Thought Content:  recent events symptoms worries concerns  Suicidal Thoughts:  Yes.  without intent/plan  Homicidal Thoughts:  No  Memory:  Immediate;   Fair Recent;   Fair Remote;   Fair  Judgement:  Fair  Insight:  Present and Shallow  Psychomotor Activity:  Restlessness  Concentration:  Fair  Recall:  [employer] of Knowledge:Fair  Language: Fair  Akathisia:  No  Handed:  Right  AIMS (if indicated):     Assets:  Desire for Improvement  ADL's:  Intact  Cognition: WNL  Sleep:  Number of Hours: 5.5     Current Medications: Current Facility-Administered Medications  Medication Dose Route Frequency Provider Last Rate Last Dose  . alum & mag hydroxide-simeth (MAALOX/MYLANTA) 200-200-20 MG/5ML suspension 30 mL  30 mL Oral Q4H PRN Laverle Hobby, PA-C      . carisoprodol (SOMA) tablet 350 mg  350 mg Oral QHS Nicholaus Bloom, MD      . clonazePAM [facility]) tablet 0.5 mg  0.5 mg Oral TID PRN Jenne Campus, MD   0.5 mg at 10/14/14 1210  . feeding supplement (ENSURE COMPLETE) (ENSURE COMPLETE) liquid 237 mL  237 mL Oral BID BM Laverle Hobby, PA-C   Stopped at 10/10/14 1400  . [START ON 10/15/2014] FLUoxetine (PROZAC) capsule 20 mg  20 mg Oral Daily Nicholaus Bloom, MD      . hydrocerin (EUCERIN) cream   Topical BID Freda Munro May Agustin, NP      . hydrOXYzine (ATARAX/VISTARIL) tablet 50 mg  50 mg Oral TID PRN Janett Labella, NP   50 mg at 10/14/14 1655  .  levETIRAcetam (KEPPRA) tablet 500 mg  500 mg Oral BID Laverle Hobby, PA-C   500 mg at 10/14/14 1655  . magnesium hydroxide (MILK OF MAGNESIA) suspension 30 mL  30 mL Oral Daily PRN Laverle Hobby, PA-C      . methocarbamol (ROBAXIN) tablet 500 mg  500 mg Oral Q6H PRN Nicholaus Bloom, MD   500 mg at 10/14/14 1430  . OXcarbazepine (TRILEPTAL) tablet 150 mg  150 mg Oral BID Jenne Campus, MD   150 mg at 10/14/14 1655  . oxyCODONE (Oxy IR/ROXICODONE) immediate release tablet 5 mg  5 mg Oral Q12H PRN Jenne Campus, MD   5 mg at 10/14/14 5035  . pantoprazole (PROTONIX) EC tablet 40 mg  40 mg Oral BID Laverle Hobby, PA-C   40 mg at 10/14/14 1655    Lab Results: No results found for this or any previous visit (from the past 48 hour(s)).  Physical Findings: AIMS: Facial and Oral Movements Muscles of Facial Expression: None, normal Lips and Perioral Area: None, normal Jaw: None, normal Tongue: None, normal,Extremity Movements Upper (arms, wrists, hands, fingers): None, normal Lower (legs, knees, ankles, toes): None, normal, Trunk Movements Neck, shoulders, hips: None, normal, Overall Severity Severity of abnormal movements (highest score from questions above): None, normal Incapacitation due to abnormal movements: None, normal Patient's awareness of abnormal movements (rate only patient's report): No Awareness, Dental Status Current problems with teeth and/or dentures?: Yes (broken teeth) Does patient usually wear dentures?: No  CIWA:    COWS:     Treatment Plan Summary: Daily contact with patient to assess and evaluate symptoms and progress in treatment and Medication management Supportive approach/coping skills/CBT, mindfulness Depression: will increase the Prozac to 20 mg daily Insomnia: states that Manuela Neptune is the only medication that helps her with sleep as eases her pain and produces some sedation. Will start Soma one at HS Chronic pain: will continue the Oxycodone and will add  Robaxin during the day keeping Soma at night Mood instability; will continue the Trileptal consider a dose adjutsment  Medical Decision Making:  Review of Psycho-Social Stressors (1), Review or order clinical lab tests (1), Established Problem, Worsening (2), Review of Medication Regimen & Side Effects (2) and Review of New Medication or Change in Dosage (2) Problem Points:  Established problem, worsening (2) and Review of psycho-social stressors (1) Data Points:  Review of medication regiment & side effects (2) Review of new medications or change in dosage (2)    Demond Shallenberger A 10/14/2014, 5:34 PM

## 2014-10-14 NOTE — Progress Notes (Signed)
[place] Group Notes:  (Nursing/MHT/Case Management/Adjunct)  Date:  10/14/2014  Time:  11:22 PM  Type of Therapy:  Psychoeducational Skills  Participation Level:  Active  Participation Quality:  Appropriate  Affect:  Depressed  Cognitive:  Appropriate  Insight:  Improving  Engagement in Group:  Engaged  Modes of Intervention:  Education  Summary of Progress/Problems: The patient described her day as having been a "mixed bag". She states that she was sad in the sense that some of her peers were discharged today and that she had to face some "family issues" while in group. She also states that she is in need of more support in her life. In terms of the theme for the day, her steps to wellness will include working on not getting "run over" by other people.   Archie Balboa S 10/14/2014, 11:22 PM

## 2014-10-14 NOTE — Progress Notes (Signed)
Patient ID: [name], female   DOB: 09/01/1966, 49 y.o.   MRN: [id number]  D: Patient pleasant on approach this am. Rates depression "7" today with feelings of hopelessness "8". Reports anxiety at a "9". Mainly was complaining of pain issues for which she got prn medication for this. Contracts for safety on the unit. No psychosis noted. A: Staff will monitor on q 15 minute checks, follow treatment plan, and give meds as ordered. R: Cooperative on the unit. Took all medications without issue.

## 2014-10-14 NOTE — Progress Notes (Signed)
D:Patient in the hallway on approach.  Patient states she has been worrying today about her mother who is in hospice.  Patient states she thinks if her mother were to pass away her sisters would not tell her.  Patient states her goal is to convey concerns when she is felling depressed.  Patient also states someone needs to live with her because she isolates. Patient denies SI/HI and denies AVH. A: Staff to monitor Q 15 mins for safety.  Encouragement and support offered.  Scheduled medications administered per orders.  Klonopin administered prn for anxiety.  Vistaril administered prn for itching. R: Patient remains safe on the unit.  Patient  attended group tonight.  Patient visible on the unit and interacting with peers.  Patient taking administered medications.

## 2014-10-15 MED ORDER — HYDROCORTISONE 0.5 % EX CREA
TOPICAL_CREAM | Freq: Three times a day (TID) | CUTANEOUS | Status: DC | PRN
  Administered 2014-10-15 – 2014-10-19 (×7): via TOPICAL
  Filled 2014-10-15: qty 28.35

## 2014-10-15 MED ORDER — AMITRIPTYLINE HCL 10 MG PO TABS
10.0000 mg | ORAL_TABLET | Freq: Every day | ORAL | Status: DC
  Administered 2014-10-15: 10 mg via ORAL
  Filled 2014-10-15 (×3): qty 1

## 2014-10-15 MED ORDER — BUSPIRONE HCL 15 MG PO TABS
7.5000 mg | ORAL_TABLET | Freq: Two times a day (BID) | ORAL | Status: DC
Start: 2014-10-15 — End: 2014-10-19
  Administered 2014-10-15 – 2014-10-18 (×7): 7.5 mg via ORAL
  Administered 2014-10-19: 08:00:00 via ORAL
  Filled 2014-10-15: qty 14
  Filled 2014-10-15: qty 1
  Filled 2014-10-15: qty 14
  Filled 2014-10-15 (×7): qty 1
  Filled 2014-10-15: qty 14
  Filled 2014-10-15 (×2): qty 1
  Filled 2014-10-15: qty 14

## 2014-10-15 MED ORDER — CARISOPRODOL 350 MG PO TABS: 700.0000 mg | ORAL_TABLET | Freq: Every day | ORAL | Status: DC

## 2014-10-15 MED ORDER — CARISOPRODOL 350 MG PO TABS
350.0000 mg | ORAL_TABLET | Freq: Every day | ORAL | Status: DC
Start: 2014-10-15 — End: 2014-10-19
  Administered 2014-10-15 – 2014-10-18 (×4): 350 mg via ORAL
  Filled 2014-10-15 (×4): qty 1
  Filled 2014-10-15: qty 14

## 2014-10-15 NOTE — Progress Notes (Signed)
Pt attended spiritual care group on grief and loss facilitated by chaplain Jerene Pitch and counseling intern [place] Zaid Tomes. Group opened with brief discussion and psycho-social ed around grief and loss in relationships and in relation to self - identifying life patterns, circumstances, changes that cause losses. Established group norm of speaking from own life experience. Group goal of establishing open and affirming space for members to share loss and experience with grief, normalize grief experience and provide psycho social education and grief support.  Group drew on narrative and Alderian therapeutic modalities.   [name]) spoke in depth about loss of her father and also anticipating death of her mother. Stated she wants to handle her mother's death in a different and healthy way than how she coped with loss of father; was able to related previous substance use to experiences of grief. Disclosed prior feeling of guilt around father's death and how holding on to guilt prevented a feeling of betrayal. [name] described how her "head and heart are in two different places," and she maybe able to tell herself she is "okay" but is now questioning this.  [name] was very engaged in the discussion and often supported other group members around their similar losses.   [place] Gavinn Collard  Counseling Intern

## 2014-10-15 NOTE — Progress Notes (Signed)
Pt has been up and active in the milieu today. She rated her depression 7 hopelessness 8 and anxiety a 9 on her self-inventory.  She denied any S/H ideation or A/V/H. She plans to f/u at [facility] and trying to get into the [facility] to help out since she has no income or any insurance.

## 2014-10-15 NOTE — [facility] Group Notes (Signed)
[place] LCSW Group Therapy  10/15/2014   1:15 PM   Type of Therapy:  Group Therapy  Participation Level:  Active  Participation Quality:  Attentive, Sharing and Supportive  Affect:  Depressed and Flat  Cognitive:  Alert and Oriented  Insight:  Developing/Improving and Engaged  Engagement in Therapy:  Developing/Improving and Engaged  Modes of Intervention:  Clarification, Confrontation, Discussion, Education, Exploration, Limit-setting, Orientation, Problem-solving, Rapport Building, Art therapist, Socialization and Support  Summary of Progress/Problems: The topic for group therapy was feelings about diagnosis.  Pt actively participated in group discussion on their past and current diagnosis and how they feel towards this.  Pt also identified how society and family members judge them, based on their diagnosis as well as stereotypes and stigmas.  Patient participated in discussion about differentiating between society's stigmas and individual worth. Patient did not want to share today but did provide other group members with support.   Tilden Fossa, MSW, [place] Worker [facility] [phone]

## 2014-10-15 NOTE — [facility] Group Notes (Signed)
0900 nursing orientation group   The focus of this group is to educate the patient on the purpose and policies of crisis stabilization and provide a format to answer questions about their admission.  The group details unit policies and expectations of patients while admitted.  Pt did not attend she was in her bed asleep and not wanting to come.

## 2014-10-15 NOTE — Clinical Social Work Note (Signed)
CSW spoke with patient's sister Verdis Frederickson who shared her concerns about patient's history of similar behavior and reports that she believes patient's behaviors are related to medication seeking and abuse. Sister reports that patient cannot stay with her at this time due to past behaviors and concerns that patient will continue taking narcotics.  Tilden Fossa, MSW, [place] Worker [facility] [phone]

## 2014-10-15 NOTE — Tx Team (Addendum)
Interdisciplinary Treatment Plan Update   Date Reviewed:  10/15/2014  Time Reviewed:  9:30 AM  Progress in Treatment:   Attending groups: Yes Participating in groups: Yes Taking medication as prescribed: Yes  Tolerating medication: Yes Family/Significant other contact made: Yes, CSW has spoken with patient's sister Delsa Sale Patient understands diagnosis: Yes  Discussing patient identified problems/goals with staff: Yes Medical problems stabilized or resolved: Yes Denies suicidal/homicidal ideation: Yes Patient has not harmed self or others: Yes  For review of initial/current patient goals, please see plan of care.  Estimated Length of Stay:  1-2 days  Reasons for Continued Hospitalization:  Anxiety Depression Medication stabilization   New Problems/Goals identified:    Discharge Plan or Barriers:    1/22: Home with outpatient follow up with [facility] and [facility].  1/26: Patient reports that she does not feel comfortable returning home alone at this time and is hoping that she can stay with her sister Verdis Frederickson at discharge although she has not spoken with this sister. Patient also reports that she is unable to afford her therapy services at [facility] so is agreeable to outpatient services at [facility]. On 1/25, patient reported in aftercare planning group that she believes her depression is worsening since admission. CSW spoke with sister Delsa Sale who states that she believes patient's recurrent SI behaviors and hospitalizations are related to trying to obtain medications and attention. Patient continues to endorse hopelessness and depression but it appears to be baseline for patient.  Additional Comments:  Patient and CSW reviewed patient's identified goals and treatment plan.  Patient verbalized understanding and agreed to treatment plan. Patient is a 49 year old Caucasian female with admitted after an overdose of Tylenol. Patient reports that she  has been experiencing increasing depression and hopelessness, as well as seasonal affective disorder. She reports being hospitalized in Oct. 2015 at [facility] following an intentional overdose on Tylenol. She states that she was afterwards taken off of her pain medications by her PCP and had not been referred to a psychiatrist. She states that she took Tylenol this time as a way to manage her pain, not to commit suicide although she endorses feelings of hopelessness. Patient lives in [place] alone. She identifies her [place] sponsor as a strong support. Patient will benefit from crisis stabilization, medication evaluation, group therapy, and psycho education in addition to case management for discharge planning. Patient and CSW reviewed pt's identified goals and treatment plan. Pt verbalized understanding and agreed to treatment plan.   Attendees: Patient:    Family:    Physician: Dr. Parke Poisson; Dr. Sabra Heck 10/15/2014 9:30 AM  Nursing: Para March; Grayland Ormond, RN 10/15/2014 9:30 AM  Clinical Social Worker: Tilden Fossa,  [place] 10/15/2014 9:30 AM  Other: Joette Catching, LCSW 10/15/2014 9:30 AM  Other: Lucinda Dell, [place] Liaison 10/15/2014 9:30 AM  Other: Lars Pinks, Case Manager 10/15/2014 9:30 AM  Other: Ave Filter, NP 10/15/2014 9:30 AM  Other: Maxie Better, LCSWA 10/15/2014 9:30 AM  Other:    Other:     Tilden Fossa, MSW, [place] Worker [facility] [phone]

## 2014-10-15 NOTE — Progress Notes (Signed)
[facility] MD Progress Note  10/15/2014 5:35 PM DAWT REEB  MRN:  [id number] Subjective:  Crass is having a hard time. She states she did not sleep that well last night . She has not heard if she is going to be able to go and stay with her sister once D/C. She states that the apartment where she currently lives, she occupied with her mother and is full of bad memories. States that she was getting increasingly more overwhelmed, depressed, she started isolating staying at the apartment what made the depression worst. Afraid that if she goes back there she will go trough the same thing again. State she is having brake trough pain. She cant take Tylenol (liver)  or NSAIDS  (stomach) or Ultram (seizures). She does not have established medical follow up, so she will probably have a hard time getting opioids from a new physician. She admits she gets hopeless helpless. She is also experiencing whelps  In the area where she had the EKG leads place while at the ICU Principal Problem: <principal problem not specified> Diagnosis:   Patient Active Problem List   Diagnosis Date Noted  . Insomnia [G47.00]   . Chronic pain syndrome [G89.4]   . Bipolar 1 disorder, mixed, severe [F31.63] 10/09/2014  . Acute blood loss anemia [D62] 10/07/2014  . Liver failure, acute [K72.00]   . GI bleed [K92.2] 10/06/2014  . Duodenal ulcer hemorrhage [K26.4] 10/06/2014  . Vomiting [R11.10] 10/05/2014  . Acute hepatitis [K72.00] 10/05/2014  . Acute renal insufficiency [N28.9] 10/05/2014  . Seizure disorder [G40.909] 10/05/2014  . Hyperammonemia [E72.20] 10/05/2014  . Suicidal ideation [R45.851] 10/05/2014  . Bipolar disorder [F31.9] 10/01/2014  . Major depression [F32.2] 10/01/2014  . Nausea and vomiting [R11.2] 09/30/2014  . Diarrhea [R19.7] 09/30/2014  . SIRS (systemic inflammatory response syndrome) [A41.9] 07/15/2014  . Tylenol overdose [T39.1X4A] 07/13/2014  . Hypokalemia [E87.6] 07/13/2014  . Depression [F32.9] 05/25/2012    Total Time spent with patient: 30 minutes   Past Medical History:  Past Medical History  Diagnosis Date  . Migraines   . Endometriosis   . ETOH abuse     sober 3 1/2 years  . Anorexia   . Depression   . Anxiety   . DJD (degenerative joint disease) of cervical spine   . Seizures   . PICC (peripherally inserted central catheter) in place     rt neck    Past Surgical History  Procedure Laterality Date  . Knee surgery    . Abdominal surgery    . Cholecystectomy    . Appendectomy    . Abdominal hysterectomy    . Nasal sinus surgery    . Laproscopy    . Carpal tunnel release      2010  . Esophagogastroduodenoscopy N/A 10/06/2014    Procedure: ESOPHAGOGASTRODUODENOSCOPY (EGD);  Surgeon: Inda Castle, MD;  Location: [place];  Service: Endoscopy;  Laterality: N/A;   Family History:  Family History  Problem Relation Age of Onset  . Hypertension Mother   . Hyperlipidemia Mother   . Heart failure Father   . Cancer Other    Social History:  History  Alcohol Use No     History  Drug Use No    History   Social History  . Marital Status: Divorced    Spouse Name: N/A    Number of Children: N/A  . Years of Education: N/A   Social History Main Topics  . Smoking status: Current Every Day Smoker --  0.25 packs/day for 20 years    Types: Cigarettes  . Smokeless tobacco: Never Used  . Alcohol Use: No  . Drug Use: No  . Sexual Activity: None   Other Topics Concern  . None   Social History Narrative   Additional History:    Sleep: Negative  Appetite:  Fair   Assessment:   Musculoskeletal: Strength & Muscle Tone: within normal limits Gait & Station: normal Patient leans: N/A   Psychiatric Specialty Exam: Physical Exam  Review of Systems  Constitutional: Positive for malaise/fatigue.  HENT: Negative.   Eyes: Negative.   Respiratory: Negative.   Cardiovascular: Negative.   Gastrointestinal: Negative.   Genitourinary: Negative.    Musculoskeletal: Positive for back pain and joint pain.  Skin: Negative.   Neurological: Negative.   Endo/Heme/Allergies: Negative.   Psychiatric/Behavioral: Positive for depression. The patient is nervous/anxious and has insomnia.     Blood pressure 135/74, pulse 103, temperature 97.6 F (36.4 C), temperature source Oral, resp. rate 16, height 5\' 2"  (1.575 m), weight 53.524 kg (118 lb).Body mass index is 21.58 kg/(m^2).  General Appearance: Fairly Groomed  Engineer, water::  Fair  Speech:  Clear and Coherent and Slow  Volume:  Decreased  Mood:  Anxious and Depressed  Affect:  Restricted  Thought Process:  Coherent and Goal Directed  Orientation:  Full (Time, Place, and Person)  Thought Content:  symptoms events worries concerns   Suicidal Thoughts:  No  Homicidal Thoughts:  No  Memory:  Immediate;   Fair Recent;   Fair Remote;   Fair  Judgement:  Fair  Insight:  Present and Shallow  Psychomotor Activity:  Restlessness  Concentration:  Fair  Recall:  [employer] of Knowledge:Fair  Language: Fair  Akathisia:  No  Handed:  Right  AIMS (if indicated):     Assets:  Desire for Improvement  ADL's:  Intact  Cognition: WNL  Sleep:  Number of Hours: 5.5     Current Medications: Current Facility-Administered Medications  Medication Dose Route Frequency Provider Last Rate Last Dose  . alum & mag hydroxide-simeth (MAALOX/MYLANTA) 200-200-20 MG/5ML suspension 30 mL  30 mL Oral Q4H PRN Laverle Hobby, PA-C      . amitriptyline (ELAVIL) tablet 10 mg  10 mg Oral QHS Nicholaus Bloom, MD      . busPIRone (BUSPAR) tablet 7.5 mg  7.5 mg Oral BID Nicholaus Bloom, MD   7.5 mg at 10/15/14 1729  . carisoprodol (SOMA) tablet 350 mg  350 mg Oral QHS Nicholaus Bloom, MD      . clon[facility]) tablet 0.5 mg  0.5 mg Oral TID PRN Jenne Campus, MD   0.5 mg at 10/15/14 1412  . feeding supplement (ENSURE COMPLETE) (ENSURE COMPLETE) liquid 237 mL  237 mL Oral BID BM Laverle Hobby, PA-C   Stopped at  10/10/14 1400  . FLUoxetine (PROZAC) capsule 20 mg  20 mg Oral Daily Nicholaus Bloom, MD   20 mg at 10/15/14 4193  . hydrocerin (EUCERIN) cream   Topical BID Yates, NP      . hydrocortisone cream 0.5 %   Topical TID PRN Elmarie Shiley, NP      . hydrOXYzine (ATARAX/VISTARIL) tablet 50 mg  50 mg Oral TID PRN Janett Labella, NP   50 mg at 10/15/14 1208  . levETIRAcetam (KEPPRA) tablet 500 mg  500 mg Oral BID Laverle Hobby, PA-C   500 mg at 10/15/14 1729  .  magnesium hydroxide (MILK OF MAGNESIA) suspension 30 mL  30 mL Oral Daily PRN Laverle Hobby, PA-C      . methocarbamol (ROBAXIN) tablet 500 mg  500 mg Oral Q6H PRN Nicholaus Bloom, MD   500 mg at 10/14/14 1430  . OXcarbazepine (TRILEPTAL) tablet 150 mg  150 mg Oral BID Jenne Campus, MD   150 mg at 10/15/14 1729  . oxyCODONE (Oxy IR/ROXICODONE) immediate release tablet 5 mg  5 mg Oral Q12H PRN Jenne Campus, MD   5 mg at 10/15/14 0825  . pantoprazole (PROTONIX) EC tablet 40 mg  40 mg Oral BID Laverle Hobby, PA-C   40 mg at 10/15/14 1729    Lab Results: No results found for this or any previous visit (from the past 29 hour(s)).  Physical Findings: AIMS: Facial and Oral Movements Muscles of Facial Expression: None, normal Lips and Perioral Area: None, normal Jaw: None, normal Tongue: None, normal,Extremity Movements Upper (arms, wrists, hands, fingers): None, normal Lower (legs, knees, ankles, toes): None, normal, Trunk Movements Neck, shoulders, hips: None, normal, Overall Severity Severity of abnormal movements (highest score from questions above): None, normal Incapacitation due to abnormal movements: None, normal Patient's awareness of abnormal movements (rate only patient's report): No Awareness, Dental Status Current problems with teeth and/or dentures?: Yes (broken teeth) Does patient usually wear dentures?: No  CIWA:    COWS:     Treatment Plan Summary: Daily contact with patient to assess and evaluate  symptoms and progress in treatment and Medication management  Supportive approach/coping skills Depression: will continue the Prozac 20 mg daily Anxiety; will start Buspar 7.5 mg BID. She wanted to be on the higher dose of Klonopin but she might have a hard time getting it prescribed Pain: will explore other options including adding Elavil at night ( she did take it before and remember it being sedating)  Insomnia: will continue the Soma 375 mg one at HS having concerns for using two at HS as she states she used to take before given her high sensitivity to medications. The Elavil at night might help with the one Soma.  Skin reaction to EKG leads: will continue to use Vistaril  Medical Decision Making:  Review of Psycho-Social Stressors (1), Established Problem, Worsening (2), Review of Medication Regimen & Side Effects (2) and Review of New Medication or Change in Dosage (2)     Nitin Mckowen A 10/15/2014, 5:35 PM

## 2014-10-15 NOTE — Progress Notes (Signed)
Recreation Therapy Notes  Animal-Assisted Activity/Therapy (AAA/T) Program Checklist/Progress Notes Patient Eligibility Criteria Checklist & Daily Group note for Rec Tx Intervention  Date: 01.26.2016 Time: 2:45pm Location: [address]   AAA/T Program Assumption of Risk Form signed by Patient/ or Parent Legal Guardian yes  Patient is free of allergies or sever asthma yes  Patient reports no fear of animals yes  Patient reports no history of cruelty to animals yes  Patient understands his/her participation is voluntary yes  Patient washes hands before animal contact yes  Patient washes hands after animal contact yes  Behavioral Response: Appropriate, Engaged, Sharing.   Education: Contractor, Pensions consultant   Education Outcome: Acknowledges education.   Clinical Observations/Feedback: Patient actively engaged in session, petting therapy dog appropriately, asking appropriate questions about therapy dog and his training and sharing stories about her pet at home.   Laureen Ochs Tamea Bai, LRT/CTRS  Lane Hacker 10/15/2014 4:43 PM

## 2014-10-15 NOTE — Plan of Care (Signed)
Problem: Diagnosis: Increased Risk For Suicide Attempt Goal: STG-Patient Will Report Suicidal Feelings to Staff Outcome: Progressing Pt denied any suicidal ideation on her self-inventory and does verbally contract to come to staff before acting on any self-harm thoughts.      

## 2014-10-15 NOTE — Clinical Social Work Note (Signed)
Patient requested that CSW attempt to contact her sister Verdis Frederickson [phone] / [phone] regarding if patient could stay with sister at discharge. CSW left a voicemail on patient's house phone on 1/25 and again on cell phone on 1/26. Awaiting return call. CSW will encourage patient to contact sister during hospitalization.  Tilden Fossa, MSW, [place] Worker [facility] [phone]

## 2014-10-16 MED ORDER — CARISOPRODOL 350 MG PO TABS
350.0000 mg | ORAL_TABLET | Freq: Every day | ORAL | Status: DC
  Administered 2014-10-16 – 2014-10-18 (×3): 350 mg via ORAL
  Filled 2014-10-16 (×3): qty 1

## 2014-10-16 MED ORDER — AMITRIPTYLINE HCL 50 MG PO TABS
50.0000 mg | ORAL_TABLET | Freq: Every day | ORAL | Status: DC
  Administered 2014-10-16 – 2014-10-18 (×3): 50 mg via ORAL
  Filled 2014-10-16: qty 1
  Filled 2014-10-16: qty 14
  Filled 2014-10-16 (×3): qty 1
  Filled 2014-10-16: qty 14

## 2014-10-16 MED ORDER — CLONAZEPAM 1 MG PO TABS
1.0000 mg | ORAL_TABLET | Freq: Three times a day (TID) | ORAL | Status: DC | PRN
  Administered 2014-10-16 – 2014-10-19 (×7): 1 mg via ORAL
  Filled 2014-10-16 (×7): qty 1

## 2014-10-16 MED ORDER — GABAPENTIN 100 MG PO CAPS
100.0000 mg | ORAL_CAPSULE | Freq: Three times a day (TID) | ORAL | Status: DC
  Administered 2014-10-16 – 2014-10-19 (×9): 100 mg via ORAL
  Filled 2014-10-16 (×5): qty 1
  Filled 2014-10-16 (×4): qty 42
  Filled 2014-10-16 (×2): qty 1
  Filled 2014-10-16: qty 42
  Filled 2014-10-16 (×2): qty 1
  Filled 2014-10-16: qty 42
  Filled 2014-10-16 (×3): qty 1

## 2014-10-16 NOTE — Progress Notes (Signed)
D:  Pt passive SI-contracts for safety. Pt denies HI/AVH. Pt is pleasant and cooperative. Pt stated she was a little upset about her sister not letting her stay with her. Pt stated she will try to get back into volunteering.   A: Pt was offered support and encouragement. Pt was given scheduled medications. Pt was encourage to attend groups. Q 15 minute checks were done for safety.   R:Pt attends groups and interacts well with peers and staff. Pt is taking medication. Pt has no complaints at this time .Pt receptive to treatment and safety maintained on unit.

## 2014-10-16 NOTE — Progress Notes (Signed)
D: Pt denies SI/HI/AVH. Pt is pleasant and cooperative. Pt concerned that she has not talked to her sister she will be staying with.   A: Pt was offered support and encouragement. Pt was given scheduled medications. Pt was encourage to attend groups. Q 15 minute checks were done for safety.   R:Pt attends groups and interacts well with peers and staff. Pt is taking medication. Pt has no complaints at this time .Pt receptive to treatment and safety maintained on unit.
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
## 2014-10-16 NOTE — Plan of Care (Signed)
Problem: Alteration in mood Goal: STG-Patient is able to discuss feelings and issues (Patient is able to discuss feelings and issues leading to depression)  Outcome: Progressing Pt stated she was not feeling so good today due to not being able to contact the sister she is supposed to live with on D/C  Problem: Diagnosis: Increased Risk For Suicide Attempt Goal: LTG-Patient Will Report Improved Mood and Deny Suicidal LTG (by discharge) Patient will report improved mood and deny suicidal ideation.  Outcome: Progressing Pt denies SI at this time

## 2014-10-16 NOTE — Plan of Care (Signed)
Problem: Ineffective individual coping Goal: LTG: Patient will report a decrease in negative feelings Outcome: Progressing Pt stated she was sad, but feeling better.      Problem: Diagnosis: Increased Risk For Suicide Attempt Goal: LTG-Patient Will Report Improvement in Psychotic Symptoms LTG (by discharge) : Patient will report improvement in psychotic symptoms.  Outcome: Progressing Pt denies AVH at this time

## 2014-10-16 NOTE — Progress Notes (Signed)
D: Patient denies SI/HI and A/V hallucinations; patient reports sleep is poor; reports appetite is poor; reports energy level is low ; reports ability to concentrate is poor; rates depression as 6/10; rates hopelessness 10/10; rates anxiety as 10/10; patient reports " I have not slept since I have been here"  A: Monitored q 15 minutes; patient encouraged to attend groups; patient educated about medications; patient given medications per physician orders; patient encouraged to express feelings and/or concerns  R: Patient is cooperative but assertive; patient complains of a lot anxiety and can become irritable at certain times; patient's interaction with staff and peers is appropriate to circumstances at most times; patient was able to set goal to talk with staff 1:1 when having feelings of SI; patient is taking medications as prescribed and tolerating medications; patient is attending all groups

## 2014-10-16 NOTE — BHH Group Notes (Signed)
   [facility] LCSW Aftercare Discharge Planning Group Note  10/16/2014  8:45 AM   Participation Quality: Alert, Appropriate and Oriented  Mood/Affect: Depressed and Flat  Depression Rating: 5  Anxiety Rating: 9  Thoughts of Suicide: Pt denies SI/HI  Will you contract for safety? Yes  Current AVH: Pt denies  Plan for Discharge/Comments: Pt attended discharge planning group and actively participated in group. CSW provided pt with today's workbook. Patient reports feelings of hopelessness and apathy this morning. She is unsure of her living arrangements at discharge, as she is hoping to live with her sister Verdis Frederickson. Patient denies SI at this time.  Transportation Means: Pt reports access to transportation  Supports: No supports mentioned at this time  Tilden Fossa, MSW, [place] Social Worker [employer] [phone]

## 2014-10-16 NOTE — Progress Notes (Signed)
[facility] MD Progress Note  10/16/2014 2:50 PM [name]  MRN:  [id number] Subjective:  [name] endorses that she is having a very hard time. She has not been able to sleep, she is getting increasingly more depressed, she has a severe headache. She heard that she is not going to be able to stay with her sister. Feels she has no family support. Admits she is feeling hopeless, helpless. She states she is frustrated as she is probably not going to be able to get the Oxycodone or the [place], or even the Klonopin when she gets out of there. States she cant take Tylenol, or NSAIDS or Ultram for her pain due to liver toxicity, ulcers and seizures that these medications have caused in the past. Other medications that could help with the pain like Cymbalta produced severe side effects. States that at this stage she is willing to try "whatever."  Principal Problem: <principal problem not specified> Diagnosis:   Patient Active Problem List   Diagnosis Date Noted  . Insomnia [G47.00]   . Chronic pain syndrome [G89.4]   . Bipolar 1 disorder, mixed, severe [F31.63] 10/09/2014  . Acute blood loss anemia [D62] 10/07/2014  . Liver failure, acute [K72.00]   . GI bleed [K92.2] 10/06/2014  . Duodenal ulcer hemorrhage [K26.4] 10/06/2014  . Vomiting [R11.10] 10/05/2014  . Acute hepatitis [K72.00] 10/05/2014  . Acute renal insufficiency [N28.9] 10/05/2014  . Seizure disorder [G40.909] 10/05/2014  . Hyperammonemia [E72.20] 10/05/2014  . Suicidal ideation [R45.851] 10/05/2014  . Bipolar disorder [F31.9] 10/01/2014  . Major depression [F32.2] 10/01/2014  . Nausea and vomiting [R11.2] 09/30/2014  . Diarrhea [R19.7] 09/30/2014  . SIRS (systemic inflammatory response syndrome) [A41.9] 07/15/2014  . Tylenol overdose [T39.1X4A] 07/13/2014  . Hypokalemia [E87.6] 07/13/2014  . Depression [F32.9] 05/25/2012   Total Time spent with patient: 30 minutes   Past Medical History:  Past Medical History  Diagnosis Date  .  Migraines   . Endometriosis   . ETOH abuse     sober 3 1/2 years  . Anorexia   . Depression   . Anxiety   . DJD (degenerative joint disease) of cervical spine   . Seizures   . PICC (peripherally inserted central catheter) in place     rt neck    Past Surgical History  Procedure Laterality Date  . Knee surgery    . Abdominal surgery    . Cholecystectomy    . Appendectomy    . Abdominal hysterectomy    . Nasal sinus surgery    . Laproscopy    . Carpal tunnel release      2010  . Esophagogastroduodenoscopy N/A 10/06/2014    Procedure: ESOPHAGOGASTRODUODENOSCOPY (EGD);  Surgeon: Inda Castle, MD;  Location: [place];  Service: Endoscopy;  Laterality: N/A;   Family History:  Family History  Problem Relation Age of Onset  . Hypertension Mother   . Hyperlipidemia Mother   . Heart failure Father   . Cancer Other    Social History:  History  Alcohol Use No     History  Drug Use No    History   Social History  . Marital Status: Divorced    Spouse Name: N/A    Number of Children: N/A  . Years of Education: N/A   Social History Main Topics  . Smoking status: Current Every Day Smoker -- 0.25 packs/day for 20 years    Types: Cigarettes  . Smokeless tobacco: Never Used  . Alcohol Use: No  .  Drug Use: No  . Sexual Activity: None   Other Topics Concern  . None   Social History Narrative   Additional History:    Sleep: Poor  Appetite:  Poor   Assessment:   Musculoskeletal: Strength & Muscle Tone: within normal limits Gait & Station: normal Patient leans: N/A   Psychiatric Specialty Exam: Physical Exam  Review of Systems  Constitutional: Negative.   Eyes: Negative.   Respiratory: Negative.   Cardiovascular: Negative.   Gastrointestinal: Negative.   Genitourinary: Negative.   Musculoskeletal: Negative.   Skin: Negative.   Neurological: Positive for headaches.  Endo/Heme/Allergies: Negative.   Psychiatric/Behavioral: Positive for depression  and substance abuse. The patient is nervous/anxious and has insomnia.     Blood pressure 112/80, pulse 92, temperature 98.1 F (36.7 C), temperature source Oral, resp. rate 15, height 5\' 2"  (1.575 m), weight 53.524 kg (118 lb).Body mass index is 21.58 kg/(m^2).  General Appearance: Disheveled  Eye Contact::  Minimal  Speech:  Clear and Coherent and not spontaneous  Volume:  Decreased  Mood:  Anxious, Depressed, Irritable and in pain  Affect:  Labile  Thought Process:  Coherent and Goal Directed  Orientation:  Full (Time, Place, and Person)  Thought Content:  events symptoms worries concerns a sense of hopelessness, helplessness  Suicidal Thoughts:  "not this very minute"  Homicidal Thoughts:  No  Memory:  Immediate;   Fair Recent;   Fair Remote;   Fair  Judgement:  Fair  Insight:  Present  Psychomotor Activity:  Restlessness  Concentration:  Fair  Recall:  [employer] of Knowledge:Fair  Language: Fair  Akathisia:  No  Handed:  Right  AIMS (if indicated):     Assets:  Desire for Improvement  ADL's:  Intact  Cognition: WNL  Sleep:  Number of Hours: 5.5     Current Medications: Current Facility-Administered Medications  Medication Dose Route Frequency Provider Last Rate Last Dose  . alum & mag hydroxide-simeth (MAALOX/MYLANTA) 200-200-20 MG/5ML suspension 30 mL  30 mL Oral Q4H PRN Laverle Hobby, PA-C      . amitriptyline (ELAVIL) tablet 10 mg  10 mg Oral QHS Nicholaus Bloom, MD   10 mg at 10/15/14 2241  . busPIRone (BUSPAR) tablet 7.5 mg  7.5 mg Oral BID Nicholaus Bloom, MD   7.5 mg at 10/16/14 0836  . carisoprodol (SOMA) tablet 350 mg  350 mg Oral QHS Nicholaus Bloom, MD   350 mg at 10/15/14 2241  . clonazePAM (KLONOPIN) tablet 0.5 mg  0.5 mg Oral TID PRN Jenne Campus, MD   0.5 mg at 10/16/14 1310  . feeding supplement (ENSURE COMPLETE) (ENSURE COMPLETE) liquid 237 mL  237 mL Oral BID BM Laverle Hobby, PA-C   Stopped at 10/10/14 1400  . FLUoxetine (PROZAC) capsule 20 mg  20  mg Oral Daily Nicholaus Bloom, MD   20 mg at 10/16/14 0836  . hydrocerin (EUCERIN) cream   Topical BID Freda Munro May Agustin, NP      . hydrocortisone cream 0.5 %   Topical TID PRN Elmarie Shiley, NP      . hydrOXYzine (ATARAX/VISTARIL) tablet 50 mg  50 mg Oral TID PRN Janett Labella, NP   50 mg at 10/16/14 3154  . levETIRAcetam (KEPPRA) tablet 500 mg  500 mg Oral BID Laverle Hobby, PA-C   500 mg at 10/16/14 0086  . magnesium hydroxide (MILK OF MAGNESIA) suspension 30 mL  30 mL Oral Daily PRN Frederico Hamman  E Simon, PA-C      . methocarbamol (ROBAXIN) tablet 500 mg  500 mg Oral Q6H PRN Nicholaus Bloom, MD   500 mg at 10/14/14 1430  . OXcarbazepine (TRILEPTAL) tablet 150 mg  150 mg Oral BID Jenne Campus, MD   150 mg at 10/16/14 0836  . oxyCODONE (Oxy IR/ROXICODONE) immediate release tablet 5 mg  5 mg Oral Q12H PRN Jenne Campus, MD   5 mg at 10/16/14 0924  . pantoprazole (PROTONIX) EC tablet 40 mg  40 mg Oral BID Laverle Hobby, PA-C   40 mg at 10/16/14 5916    Lab Results: No results found for this or any previous visit (from the past 48 hour(s)).  Physical Findings: AIMS: Facial and Oral Movements Muscles of Facial Expression: None, normal Lips and Perioral Area: None, normal Jaw: None, normal Tongue: None, normal,Extremity Movements Upper (arms, wrists, hands, fingers): None, normal Lower (legs, knees, ankles, toes): None, normal, Trunk Movements Neck, shoulders, hips: None, normal, Overall Severity Severity of abnormal movements (highest score from questions above): None, normal Incapacitation due to abnormal movements: None, normal Patient's awareness of abnormal movements (rate only patient's report): No Awareness, Dental Status Current problems with teeth and/or dentures?: Yes (broken teeth) Does patient usually wear dentures?: No  CIWA:    COWS:     Treatment Plan Summary: Daily contact with patient to assess and evaluate symptoms and progress in treatment and Medication  management Supportive approach/coping skills/stress management Depression: continue the Prozac 20 mg daily Insomnia: Will add a Soma at 6 PM and keep the one at HS                  Will increase the Elavil dose to 50 mg HS Pain: Will increase the Soma to BID, increase the Elavil to 50 mg          Will add Neurontin 100 mg TID with plans to optimize response by increasing the dose depending on tolerability   Medical Decision Making:  Review of Psycho-Social Stressors (1), Established Problem, Worsening (2), Review of Medication Regimen & Side Effects (2) and Review of New Medication or Change in Dosage (2)     Joua Bake A 10/16/2014, 2:50 PM

## 2014-10-16 NOTE — Progress Notes (Signed)
Adult Psychoeducational Group Note  Date:  10/16/2014 Time:  9:55 PM  Group Topic/Focus:  Narcotics Anonymous  Participation Level:  Did Not Attend  Additional Comments:  Pt did not attend group due to personal Narcotics Anonymous group.  Rn is aware.  Milus Glazier 10/16/2014, 9:55 PM

## 2014-10-17 NOTE — BHH Group Notes (Signed)
[facility] LCSW Group Therapy 10/17/2014 1:15 PM Type of Therapy: Group Therapy Participation Level: Active  Participation Quality: Attentive, Sharing and Supportive  Affect: Depressed and Flat  Cognitive: Alert and Oriented  Insight: Developing/Improving and Engaged  Engagement in Therapy: Developing/Improving and Engaged  Modes of Intervention: Activity, Clarification, Confrontation, Discussion, Education, Exploration, Limit-setting, Orientation, Problem-solving, Rapport Building, Art therapist, Socialization and Support  Summary of Progress/Problems: Patient was attentive and engaged with speaker from [place]. Patient was attentive to speaker while they shared their story of dealing with mental health and overcoming it. Patient expressed interest in their programs and services and received information on their agency. Patient processed ways they can relate to the speaker. Patient actively listened during presentation and shared that she is in recovery but has not been able to effectively address her depression. Presenter provided patient with emotional support and encouragement.  Tilden Fossa, MSW, [place] Worker [facility] [phone]

## 2014-10-17 NOTE — BHH Group Notes (Signed)
0900 nursing orientation group  The focus of this group is to educate the patient on the purpose and policies of crisis stabilization and provide a format to answer questions about their admission.  The group details unit policies and expectations of patients while admitted.  Pt was an active participant in group and was appropriate in her responses.

## 2014-10-17 NOTE — Progress Notes (Signed)
[facility] Group Notes:  (Nursing/MHT/Case Management/Adjunct)  Date:  10/17/2014  Time:  2030  Type of Therapy:  wrap up group  Participation Level:  Active  Participation Quality:  Appropriate, Attentive, Sharing and Supportive  Affect:  Irritable   Cognitive:  Appropriate  Insight:  Good  Engagement in Group:  Engaged  Modes of Intervention:  Clarification, Education and Support  Summary of Progress/Problems: Pt reports having the first full night of sleep in three weeks. Pt is planning on going back to her condo and hopes to get a roommate.  Pt shared that she has been sober for 3 years and is looking forward to returning to work and volunteering at [employer].   Jacques Navy 10/17/2014, 9:41 PM

## 2014-10-17 NOTE — Progress Notes (Signed)
[facility] MD Progress Note  10/17/2014 4:55 PM [name]  MRN:  [id number] Subjective:  Hailey states that she was able to sleep better last night and that this has made a big difference in her mood. She has accepted that her sister will not do anything for her. States that the thought of going back to her apartment makes her anxious, nauseated wanting to throw up. States that she does not see any other option. She is also concerned about being able to continue her medications if she does not have insurance. 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 Insomnia [G47.00]   . Chronic pain syndrome [G89.4]   . Bipolar 1 disorder, mixed, severe [F31.63] 10/09/2014  . Acute blood loss anemia [D62] 10/07/2014  . Liver failure, acute [K72.00]   . GI bleed [K92.2] 10/06/2014  . Duodenal ulcer hemorrhage [K26.4] 10/06/2014  . Vomiting [R11.10] 10/05/2014  . Acute hepatitis [K72.00] 10/05/2014  . Acute renal insufficiency [N28.9] 10/05/2014  . Seizure disorder [G40.909] 10/05/2014  . Hyperammonemia [E72.20] 10/05/2014  . Suicidal ideation [R45.851] 10/05/2014  . Bipolar disorder [F31.9] 10/01/2014  . Major depression [F32.2] 10/01/2014  . Nausea and vomiting [R11.2] 09/30/2014  . Diarrhea [R19.7] 09/30/2014  . SIRS (systemic inflammatory response syndrome) [A41.9] 07/15/2014  . Tylenol overdose [T39.1X4A] 07/13/2014  . Hypokalemia [E87.6] 07/13/2014  . Depression [F32.9] 05/25/2012   Total Time spent with patient: 30 minutes   Past Medical History:  Past Medical History  Diagnosis Date  . Migraines   . Endometriosis   . ETOH abuse     sober 3 1/2 years  . Anorexia   . Depression   . Anxiety   . DJD (degenerative joint disease) of cervical spine   . Seizures   . PICC (peripherally inserted central catheter) in place     rt neck     Past Surgical History  Procedure Laterality Date  . Knee surgery    . Abdominal surgery    . Cholecystectomy    . Appendectomy    . Abdominal hysterectomy    . Nasal sinus surgery    . Laproscopy    . Carpal tunnel release      2010  . Esophagogastroduodenoscopy N/A 10/06/2014    Procedure: ESOPHAGOGASTRODUODENOSCOPY (EGD);  Surgeon: Inda Castle, MD;  Location: [place];  Service: Endoscopy;  Laterality: N/A;   Family History:  Family History  Problem Relation Age of Onset  . Hypertension Mother   . Hyperlipidemia Mother   . Heart failure Father   . Cancer Other    Social History:  History  Alcohol Use No     History  Drug Use No    History   Social History  . Marital Status: Divorced    Spouse Name: N/A    Number of Children: N/A  . Years of Education: N/A   Social History Main Topics  . Smoking status: Current Every Day Smoker -- 0.25 packs/day for 20 years    Types: Cigarettes  . Smokeless tobacco: Never Used  . Alcohol Use: No  . Drug Use: No  . Sexual Activity: None   Other Topics Concern  . None   Social History Narrative   Additional History:    Sleep: Fair  Appetite:  Fair   Assessment:  Musculoskeletal: Strength & Muscle Tone: within normal limits Gait & Station: normal Patient leans: N/A   Psychiatric Specialty Exam: Physical Exam  Review of Systems  Constitutional: Negative.   HENT: Negative.   Eyes: Negative.   Respiratory: Negative.   Cardiovascular: Negative.   Gastrointestinal: Negative.   Genitourinary: Negative.   Musculoskeletal: Positive for back pain and joint pain.  Skin: Negative.   Endo/Heme/Allergies: Negative.   Psychiatric/Behavioral: Positive for depression. The patient is nervous/anxious and has insomnia.     Blood pressure 120/78, pulse 66, temperature 97.8 F (36.6 C), temperature source Oral, resp. rate 16, height 5\' 2"  (1.575 m), weight 53.524 kg (118 lb).Body mass index is 21.58 kg/(m^2).   General Appearance: Fairly Groomed  Engineer, water::  Fair  Speech:  Clear and Coherent  Volume:  Normal  Mood:  Anxious and worried  Affect:  anxious worried  Thought Process:  Coherent and Goal Directed  Orientation:  Full (Time, Place, and Person)  Thought Content:  symptoms worries and concerns about going back home and her ability to contiinue the medications  Suicidal Thoughts:  No  Homicidal Thoughts:  No  Memory:  Immediate;   Fair Recent;   Fair Remote;   Fair  Judgement:  Fair  Insight:  Present  Psychomotor Activity:  Normal  Concentration:  Fair  Recall:  [employer] of Knowledge:Fair  Language: Fair  Akathisia:  No  Handed:  Right  AIMS (if indicated):     Assets:  Desire for Improvement Housing Vocational/Educational  ADL's:  Intact  Cognition: WNL  Sleep:  Number of Hours: 5.5     Current Medications: Current Facility-Administered Medications  Medication Dose Route Frequency Provider Last Rate Last Dose  . alum & mag hydroxide-simeth (MAALOX/MYLANTA) 200-200-20 MG/5ML suspension 30 mL  30 mL Oral Q4H PRN Laverle Hobby, PA-C      . amitriptyline (ELAVIL) tablet 50 mg  50 mg Oral QHS Nicholaus Bloom, MD   50 mg at 10/16/14 2131  . busPIRone (BUSPAR) tablet 7.5 mg  7.5 mg Oral BID Nicholaus Bloom, MD   7.5 mg at 10/17/14 0805  . carisoprodol (SOMA) tablet 350 mg  350 mg Oral QHS Nicholaus Bloom, MD   350 mg at 10/16/14 2131  . carisoprodol (SOMA) tablet 350 mg  350 mg Oral QPC supper Nicholaus Bloom, MD   350 mg at 10/16/14 1823  . clonazePAM (KLONOPIN) tablet 1 mg  1 mg Oral TID PRN Nicholaus Bloom, MD   1 mg at 10/17/14 1133  . FLUoxetine (PROZAC) capsule 20 mg  20 mg Oral Daily Nicholaus Bloom, MD   20 mg at 10/17/14 0805  . gabapentin (NEURONTIN) capsule 100 mg  100 mg Oral TID Nicholaus Bloom, MD   100 mg at 10/17/14 1128  . hydrocerin (EUCERIN) cream   Topical BID [place], NP      . hydrocortisone cream 0.5 %   Topical TID PRN Elmarie Shiley, NP      .  hydrOXYzine (ATARAX/VISTARIL) tablet 50 mg  50 mg Oral TID PRN Janett Labella, NP   50 mg at 10/17/14 1443  . levETIRAcetam (KEPPRA) tablet 500 mg  500 mg Oral BID Laverle Hobby, PA-C   500 mg at 10/17/14 0805  . magnesium hydroxide (MILK OF MAGNESIA) suspension 30 mL  30 mL Oral Daily PRN Laverle Hobby, PA-C      . methocarbamol (ROBAXIN) tablet 500 mg  500 mg  Oral Q6H PRN Nicholaus Bloom, MD   500 mg at 10/14/14 1430  . OXcarbazepine (TRILEPTAL) tablet 150 mg  150 mg Oral BID Jenne Campus, MD   150 mg at 10/17/14 0804  . oxyCODONE (Oxy IR/ROXICODONE) immediate release tablet 5 mg  5 mg Oral Q12H PRN Jenne Campus, MD   5 mg at 10/17/14 0926  . pantoprazole (PROTONIX) EC tablet 40 mg  40 mg Oral BID Laverle Hobby, PA-C   40 mg at 10/17/14 3254    Lab Results: No results found for this or any previous visit (from the past 22 hour(s)).  Physical Findings: AIMS: Facial and Oral Movements Muscles of Facial Expression: None, normal Lips and Perioral Area: None, normal Jaw: None, normal Tongue: None, normal,Extremity Movements Upper (arms, wrists, hands, fingers): None, normal Lower (legs, knees, ankles, toes): None, normal, Trunk Movements Neck, shoulders, hips: None, normal, Overall Severity Severity of abnormal movements (highest score from questions above): None, normal Incapacitation due to abnormal movements: None, normal Patient's awareness of abnormal movements (rate only patient's report): No Awareness, Dental Status Current problems with teeth and/or dentures?: Yes (broken teeth) Does patient usually wear dentures?: No  CIWA:    COWS:     Treatment Plan Summary: Daily contact with patient to assess and evaluate symptoms and progress in treatment and Medication management Depression: will pursue the Prozac further                      Will reassess the effect of Elavil added to the Prozac ( possible augmentation) Chronic pain: will reassess the effect of Neurontin  and Elavil on her pain Insomnia: will continue the Elavil with the Soma at night Anxiety; will pursue the Buspar and the Neurontin and reassess its effect on the anxiety, minimizing the use of klonopin Medical Decision Making:  Review of Psycho-Social Stressors (1), Review of Medication Regimen & Side Effects (2) and Review of New Medication or Change in Dosage (2)     Sheala [name] A 10/17/2014, 4:55 PM

## 2014-10-18 NOTE — Plan of Care (Signed)
Problem: Alteration in mood Goal: STG-Patient reports thoughts of self-harm to staff Outcome: Progressing Pt still having some passive S/I today with no plans here in the hospital and does contract verbally to come to staff before acting on any self-harm thoughts.

## 2014-10-18 NOTE — BHH Group Notes (Signed)
   [facility] LCSW Aftercare Discharge Planning Group Note  10/18/2014  8:45 AM   Participation Quality: Alert, Appropriate and Oriented  Mood/Affect: Depressed and Flat  Depression Rating: 7  Anxiety Rating: 9  Thoughts of Suicide: Pt endorses passive SI today  Will you contract for safety? Yes  Current AVH: Pt denies  Plan for Discharge/Comments: Pt attended discharge planning group and actively participated in group. CSW provided pt with today's workbook. Patient reports experiencing depression, high anxiety, and passive SI today. She is requesting to discharge on Saturday 1/30 in order to have transportation and a friend to stay with her during the weekend. She plans to return home to follow up with [facility] at discharge.  Transportation Means: Pt reports access to transportation via her sponsor  Supports: Patient has identified her sponsor as supportive.  Tilden Fossa, MSW, [place] Worker [facility] [phone]

## 2014-10-18 NOTE — Tx Team (Signed)
Interdisciplinary Treatment Plan Update   Date Reviewed:  10/18/2014  Time Reviewed:  9:30 AM  Progress in Treatment:   Attending groups: Yes Participating in groups: Yes Taking medication as prescribed: Yes  Tolerating medication: Yes Family/Significant other contact made: Yes, CSW has spoken with patient's sister Delsa Sale and Verdis Frederickson Patient understands diagnosis: Yes  Discussing patient identified problems/goals with staff: Yes Medical problems stabilized or resolved: Yes Denies suicidal/homicidal ideation: Patient endorsing passive SI at this time. Patient has not harmed self or others: Yes  For review of initial/current patient goals, please see plan of care.  Estimated Length of Stay:  Discharge anticipated for tomorrow 10/19/14  Reasons for Continued Hospitalization:  Anxiety Depression Medication stabilization   New Problems/Goals identified:    Discharge Plan or Barriers:    1/22: Home with outpatient follow up with [facility] and [facility].  1/26: Patient reports that she does not feel comfortable returning home alone at this time and is hoping that she can stay with her sister Verdis Frederickson at discharge although she has not spoken with this sister. Patient also reports that she is unable to afford her therapy services at [facility] so is agreeable to outpatient services at [facility]. On 1/25, patient reported in aftercare planning group that she believes her depression is worsening since admission. CSW spoke with sister Delsa Sale who states that she believes patient's recurrent SI behaviors and hospitalizations are related to trying to obtain medications and attention. Patient continues to endorse hopelessness and depression but it appears to be baseline for patient.  1/29: Patient agreeable to discharge back to her residence tomorrow 10/19/14 and will follow up with [facility]. CSW has spoken with patient's sister Verdis Frederickson who is not  agreeable to patient staying with her at this time. Patient encouraged to contact [facility] at discharge to schedule appointment to establish PCP provider. Patient continues to endorse depression and hopelessness but appears to be patient's baseline according to family and MD who has long standing relationship with patient. Patient endorsing passive SI at this time but can contract for safety.  Additional Comments:  Patient and CSW reviewed patient's identified goals and treatment plan.  Patient verbalized understanding and agreed to treatment plan. Patient is a 49 year old Caucasian female with admitted after an overdose of Tylenol. Patient reports that she has been experiencing increasing depression and hopelessness, as well as seasonal affective disorder. She reports being hospitalized in Oct. 2015 at [facility] following an intentional overdose on Tylenol. She states that she was afterwards taken off of her pain medications by her PCP and had not been referred to a psychiatrist. She states that she took Tylenol this time as a way to manage her pain, not to commit suicide although she endorses feelings of hopelessness. Patient lives in [place] alone. She identifies her [place] sponsor as a strong support. Patient will benefit from crisis stabilization, medication evaluation, group therapy, and psycho education in addition to case management for discharge planning. Patient and CSW reviewed pt's identified goals and treatment plan. Pt verbalized understanding and agreed to treatment plan.   Attendees:  Patient:  10/18/2014 8:43 AM   Signature: Gabriel Earing, MD 10/18/2014 8:43 AM  Signature: Carlton Adam, MD 10/18/2014 8:43 AM  Signature: Para March, RN 10/18/2014 8:43 AM  Signature: Desma Paganini, RN 10/18/2014 8:43 AM  Signature: Marcello Moores, RN 10/18/2014 8:43 AM  Signature: Joette Catching, LCSW 10/18/2014 8:43 AM  Signature:  Tilden Fossa,  LCSW-A 10/18/2014 8:43 AM  Signature: Lucinda Dell, Care Coordinator [facility] 10/18/2014 8:43 AM  Signature:  10/18/2014 8:43 AM  Signature:  10/18/2014 8:43 AM  Signature: Lars Pinks, RN [facility] 10/18/2014 8:43 AM  Signature: 10/18/2014 8:43 AM   Tilden Fossa, MSW, [place] Clinical Social Worker [facility] [phone]

## 2014-10-18 NOTE — Progress Notes (Signed)
Pt has been up and active in the milieu today. She rated her depression 6 and hopelessness a 7 and anxiety a 8 on her self-inventory.  She reported having some passive S/I today with no plans here in the hospital and did contract verbally to come to staff before acting on any self-harm thoughts. She did admit that she was afraid to go home today with no one there to be with her she did not feel safe.  The plan is for her to discharge tomorrow with her sponsor and she will have someone with her through the weekend.  She had many prn medications for anxiety, itching and pain please see MAR and flowsheets.

## 2014-10-18 NOTE — Progress Notes (Signed)
[facility] MD Progress Note  10/18/2014 5:05 PM [name]  MRN:  [id number] Subjective:  Amsi endorses that she needs to move on. Has accepted that her family is not going to be there for her. She will have her sponsor pick her up tomorrow and will have some members of her home group stay with her during the weekend. She is still dealing with pain as well as some anxiety. She does feel they are more manageable. Still afraid of going home by herself. States that when she pictured being there by herself, the SI returned. Knowing that her sponsor and the other [place] members are going to be there for her tomorrow have decreased the anxiety, the apprehension. She states that she will probably try to get back to work on Monday. Principal Problem: <principal problem not specified> Diagnosis:   Patient Active Problem List   Diagnosis Date Noted  . Insomnia [G47.00]   . Chronic pain syndrome [G89.4]   . Bipolar 1 disorder, mixed, severe [F31.63] 10/09/2014  . Acute blood loss anemia [D62] 10/07/2014  . Liver failure, acute [K72.00]   . GI bleed [K92.2] 10/06/2014  . Duodenal ulcer hemorrhage [K26.4] 10/06/2014  . Vomiting [R11.10] 10/05/2014  . Acute hepatitis [K72.00] 10/05/2014  . Acute renal insufficiency [N28.9] 10/05/2014  . Seizure disorder [G40.909] 10/05/2014  . Hyperammonemia [E72.20] 10/05/2014  . Suicidal ideation [R45.851] 10/05/2014  . Bipolar disorder [F31.9] 10/01/2014  . Major depression [F32.2] 10/01/2014  . Nausea and vomiting [R11.2] 09/30/2014  . Diarrhea [R19.7] 09/30/2014  . SIRS (systemic inflammatory response syndrome) [A41.9] 07/15/2014  . Tylenol overdose [T39.1X4A] 07/13/2014  . Hypokalemia [E87.6] 07/13/2014  . Depression [F32.9] 05/25/2012   Total Time spent with patient: 30 minutes   Past Medical History:  Past Medical History  Diagnosis Date  . Migraines   . Endometriosis   . ETOH abuse     sober 3 1/2 years  . Anorexia   . Depression   . Anxiety   . DJD  (degenerative joint disease) of cervical spine   . Seizures   . PICC (peripherally inserted central catheter) in place     rt neck    Past Surgical History  Procedure Laterality Date  . Knee surgery    . Abdominal surgery    . Cholecystectomy    . Appendectomy    . Abdominal hysterectomy    . Nasal sinus surgery    . Laproscopy    . Carpal tunnel release      2010  . Esophagogastroduodenoscopy N/A 10/06/2014    Procedure: ESOPHAGOGASTRODUODENOSCOPY (EGD);  Surgeon: Inda Castle, MD;  Location: [place];  Service: Endoscopy;  Laterality: N/A;   Family History:  Family History  Problem Relation Age of Onset  . Hypertension Mother   . Hyperlipidemia Mother   . Heart failure Father   . Cancer Other    Social History:  History  Alcohol Use No     History  Drug Use No    History   Social History  . Marital Status: Divorced    Spouse Name: N/A    Number of Children: N/A  . Years of Education: N/A   Social History Main Topics  . Smoking status: Current Every Day Smoker -- 0.25 packs/day for 20 years    Types: Cigarettes  . Smokeless tobacco: Never Used  . Alcohol Use: No  . Drug Use: No  . Sexual Activity: None   Other Topics Concern  . None   Social  History Narrative   Additional History:    Sleep: Fair  Appetite:  Fair   Assessment:   Musculoskeletal: Strength & Muscle Tone: within normal limits Gait & Station: normal Patient leans: N/A   Psychiatric Specialty Exam: Physical Exam  Review of Systems  Constitutional: Negative.   HENT: Negative.   Eyes: Negative.   Respiratory: Negative.   Cardiovascular: Negative.   Gastrointestinal: Negative.   Genitourinary: Negative.   Musculoskeletal: Positive for back pain and joint pain.  Skin: Negative.   Neurological: Negative.   Endo/Heme/Allergies: Negative.   Psychiatric/Behavioral: Positive for depression. The patient is nervous/anxious and has insomnia.     Blood pressure 117/80, pulse  113, temperature 97.6 F (36.4 C), temperature source Oral, resp. rate 16, height 5\' 2"  (1.575 m), weight 53.524 kg (118 lb).Body mass index is 21.58 kg/(m^2).  General Appearance: Fairly Groomed  Engineer, water::  Fair  Speech:  Clear and Coherent  Volume:  Decreased  Mood:  Anxious and worried  Affect:  anxious worried  Thought Process:  Coherent and Goal Directed  Orientation:  Full (Time, Place, and Person)  Thought Content:  symptoms events plans as she anticipates D/C worries and concerns  Suicidal Thoughts:  Intermittent when thinks about going back to the apartment by herself  Homicidal Thoughts:  No  Memory:  Immediate;   Fair Recent;   Fair Remote;   Fair  Judgement:  Fair  Insight:  Fair  Psychomotor Activity:  Restlessness  Concentration:  Fair  Recall:  [employer] of Knowledge:Fair  Language: Fair  Akathisia:  No  Handed:  Right  AIMS (if indicated):     Assets:  Desire for Improvement Housing Vocational/Educational  ADL's:  Intact  Cognition: WNL  Sleep:  Number of Hours: 5.5     Current Medications: Current Facility-Administered Medications  Medication Dose Route Frequency Provider Last Rate Last Dose  . alum & mag hydroxide-simeth (MAALOX/MYLANTA) 200-200-20 MG/5ML suspension 30 mL  30 mL Oral Q4H PRN Laverle Hobby, PA-C      . amitriptyline (ELAVIL) tablet 50 mg  50 mg Oral QHS Nicholaus Bloom, MD   50 mg at 10/17/14 2143  . busPIRone (BUSPAR) tablet 7.5 mg  7.5 mg Oral BID Nicholaus Bloom, MD   7.5 mg at 10/18/14 0810  . carisoprodol (SOMA) tablet 350 mg  350 mg Oral QHS Nicholaus Bloom, MD   350 mg at 10/17/14 2143  . carisoprodol (SOMA) tablet 350 mg  350 mg Oral QPC supper Nicholaus Bloom, MD   350 mg at 10/17/14 2020  . clonazePAM (KLONOPIN) tablet 1 mg  1 mg Oral TID PRN Nicholaus Bloom, MD   1 mg at 10/18/14 1513  . FLUoxetine (PROZAC) capsule 20 mg  20 mg Oral Daily Nicholaus Bloom, MD   20 mg at 10/18/14 1610  . gabapentin (NEURONTIN) capsule 100 mg  100 mg  Oral TID Nicholaus Bloom, MD   100 mg at 10/18/14 1205  . hydrocerin (EUCERIN) cream   Topical BID Freda Munro May Agustin, NP      . hydrocortisone cream 0.5 %   Topical TID PRN Elmarie Shiley, NP      . hydrOXYzine (ATARAX/VISTARIL) tablet 50 mg  50 mg Oral TID PRN Janett Labella, NP   50 mg at 10/18/14 1207  . levETIRAcetam (KEPPRA) tablet 500 mg  500 mg Oral BID Laverle Hobby, PA-C   500 mg at 10/18/14 0809  . magnesium hydroxide (MILK  OF MAGNESIA) suspension 30 mL  30 mL Oral Daily PRN Laverle Hobby, PA-C      . methocarbamol (ROBAXIN) tablet 500 mg  500 mg Oral Q6H PRN Nicholaus Bloom, MD   500 mg at 10/14/14 1430  . OXcarbazepine (TRILEPTAL) tablet 150 mg  150 mg Oral BID Jenne Campus, MD   150 mg at 10/18/14 0809  . oxyCODONE (Oxy IR/ROXICODONE) immediate release tablet 5 mg  5 mg Oral Q12H PRN Jenne Campus, MD   5 mg at 10/18/14 0958  . pantoprazole (PROTONIX) EC tablet 40 mg  40 mg Oral BID Laverle Hobby, PA-C   40 mg at 10/18/14 8127    Lab Results: No results found for this or any previous visit (from the past 60 hour(s)).  Physical Findings: AIMS: Facial and Oral Movements Muscles of Facial Expression: None, normal Lips and Perioral Area: None, normal Jaw: None, normal Tongue: None, normal,Extremity Movements Upper (arms, wrists, hands, fingers): None, normal Lower (legs, knees, ankles, toes): None, normal, Trunk Movements Neck, shoulders, hips: None, normal, Overall Severity Severity of abnormal movements (highest score from questions above): None, normal Incapacitation due to abnormal movements: None, normal Patient's awareness of abnormal movements (rate only patient's report): No Awareness, Dental Status Current problems with teeth and/or dentures?: Yes (broken teeth) Does patient usually wear dentures?: No  CIWA:    COWS:     Treatment Plan Summary: Daily contact with patient to assess and evaluate symptoms and progress in treatment and Medication  management Will continue the medication regime as it stands now. Will D/C tomorrow as it would be a safer disposition knowing she will have support to help her with the transition of getting back home. This support is not available today Will explore ways of making the medications available to her on an outpatient basis once she is D/C  Medical Decision Making:  Review of Psycho-Social Stressors (1), Review of Medication Regimen & Side Effects (2) and Review of New Medication or Change in Dosage (2)     Colson Barco A 10/18/2014, 5:05 PM

## 2014-10-18 NOTE — Progress Notes (Signed)
  [facility] Adult Case Management Discharge Plan :  Will you be returning to the same living situation after discharge:  Yes,  patient plans to return to her condo at discharge At discharge, do you have transportation home?: Yes,  patient will have a friend pick her up Do you have the ability to pay for your medications: Yes,  patient will be provided with medication samples and prescriptions at discharge  Release of information consent forms completed and in the chart;  Patient's signature needed at discharge.  Patient to Follow up at: Follow-up Information    Follow up with [facility].   Why:  Walk in clinic between 8 - 12 and 1 - 3 Monday through Friday for assessment and services.   Contact information:   [place], [place] [address] Phone:  [phone]      Patient denies SI/HI: Yes,  denies    Safety Planning and Suicide Prevention discussed: Yes,  with patient and sister  Has patient been referred to the Quitline?: Yes, faxed on 10/18/14  Natallie Ravenscroft, Casimiro Needle 10/18/2014, 4:07 PM

## 2014-10-18 NOTE — Progress Notes (Signed)
D: Pt denies SI/HI/AVh. Pt is pleasant and cooperative. Pt stated she felt good, and is ready to go.   A: Pt was offered support and encouragement. Pt was given scheduled medications. Pt was encourage to attend groups. Q 15 minute checks were done for safety.    R:Pt attends groups and interacts well with peers and staff. Pt is taking medication. Pt has no complaints at this time .Pt receptive to treatment and safety maintained on unit.

## 2014-10-18 NOTE — BHH Group Notes (Signed)
[place] LCSW Group Therapy 10/18/2014 1:15 PM Type of Therapy: Group Therapy Participation Level: Active  Participation Quality: Attentive, Sharing and Supportive  Affect: Appropriate  Cognitive: Alert and Oriented  Insight: Developing/Improving and Engaged  Engagement in Therapy: Developing/Improving and Engaged  Modes of Intervention: Clarification, Confrontation, Discussion, Education, Exploration, Limit-setting, Orientation, Problem-solving, Rapport Building, Art therapist, Socialization and Support  Summary of Progress/Problems: The topic for today was feelings about relapse. Pt discussed what relapse prevention is to them and identified triggers that they are on the path to relapse. Pt processed their feeling towards relapse and was able to relate to peers. Pt discussed coping skills that can be used for relapse prevention. Patient identified isolation and medication non-compliance as her relapse behaviors. She discussed actions that she can take to prevent these such as attending AA meetings and support groups, as well as setting reminders to take her medications. CSW and other group members provided patient with emotional support and encouragement.  Tilden Fossa, MSW, [place] Worker [facility] [phone]

## 2014-10-18 NOTE — Clinical Social Work Note (Signed)
At patient request, CSW contacted her employer [employer] Pilar [phone] to inform her of patient's hospitalization.  Tilden Fossa, MSW, [place] Worker [facility] [phone]

## 2014-10-19 ENCOUNTER — Other Ambulatory Visit: Payer: Self-pay | Admitting: Internal Medicine

## 2014-10-19 DIAGNOSIS — F332 Major depressive disorder, recurrent severe without psychotic features: Secondary | ICD-10-CM | POA: Insufficient documentation

## 2014-10-19 MED ORDER — CARISOPRODOL 350 MG PO TABS: 350.0000 mg | ORAL_TABLET | Freq: Every day | ORAL | Status: DC

## 2014-10-19 MED ORDER — AMITRIPTYLINE HCL 50 MG PO TABS: 50.0000 mg | ORAL_TABLET | Freq: Every day | ORAL | Status: DC

## 2014-10-19 MED ORDER — GABAPENTIN 100 MG PO CAPS: 100.0000 mg | ORAL_CAPSULE | Freq: Three times a day (TID) | ORAL | Status: DC

## 2014-10-19 MED ORDER — CLONAZEPAM 1 MG PO TABS: 1.0000 mg | ORAL_TABLET | Freq: Three times a day (TID) | ORAL | Status: DC | PRN

## 2014-10-19 MED ORDER — BUSPIRONE HCL 7.5 MG PO TABS: 7.5000 mg | ORAL_TABLET | Freq: Two times a day (BID) | ORAL | Status: DC

## 2014-10-19 MED ORDER — FLUOXETINE HCL 20 MG PO CAPS: 20.0000 mg | ORAL_CAPSULE | Freq: Every day | ORAL | Status: DC

## 2014-10-19 MED ORDER — OXCARBAZEPINE 150 MG PO TABS: 150.0000 mg | ORAL_TABLET | Freq: Two times a day (BID) | ORAL | Status: DC

## 2014-10-19 MED ORDER — ALUM & MAG HYDROXIDE-SIMETH 200-200-20 MG/5ML PO SUSP: 30.0000 mL | ORAL | Status: DC | PRN

## 2014-10-19 MED ORDER — LEVETIRACETAM 500 MG PO TABS: 500.0000 mg | ORAL_TABLET | Freq: Two times a day (BID) | ORAL | Status: DC

## 2014-10-19 MED ORDER — METHOCARBAMOL 500 MG PO TABS: 500.0000 mg | ORAL_TABLET | Freq: Four times a day (QID) | ORAL | Status: DC | PRN

## 2014-10-19 MED ORDER — PANTOPRAZOLE SODIUM 40 MG PO TBEC: 40.0000 mg | DELAYED_RELEASE_TABLET | Freq: Two times a day (BID) | ORAL | Status: DC

## 2014-10-19 NOTE — BHH Group Notes (Signed)
[facility] Group Notes:  (Clinical Social Work)  10/19/2014     10:30-11AM  Summary of Progress/Problems:   The main focus of today's process group was to discuss what the patients would like to work on changing in their lives, and how they might go about it.    The patient stated she is an alcoholic and needs to pursue sobriety again.  She wants to do this through going to [employer] again, and keeping a gratitude list.  Type of Therapy:  Group Therapy - Process   Participation Level:  Active  Participation Quality:  Appropriate, Attentive and Supportive  Affect:  Blunted  Cognitive:  Alert and Appropriate  Insight:  Engaged  Engagement in Therapy:  Engaged  Modes of Intervention:  Education, Motivational Interviewing  Selmer Dominion, LCSW 10/19/2014, 2:41 PM

## 2014-10-19 NOTE — Progress Notes (Signed)
Pt d/c home as per order. Cooperative with d/c procedure. Denied SI / HI AVH and pain at time of assessment. Vitals done and WNL. D/C instructions and ordered medication samples reviewed with pt, understanding verbalized.  All belongings in locker 12 given to pt and belonging sheet signed in agreement to items received (cell phones, set of keys & a pair of tennis shoes with lace). Safety maintained on Q 15 minutes checks for suicide as per order till time of d/c without gestures or event of self harm to note. Pt was picked up by her sponsor. Affect bright as she bid good bye to peers and staff. No physical distress evident at time of departure from this facility.

## 2014-10-19 NOTE — Plan of Care (Signed)
Problem: Ineffective individual coping Goal: STG: Patient will remain free from self harm Outcome: Progressing Safety maintained on Q 15 minutes checks for suicide without gestures / event of self harm to note at present.

## 2014-10-19 NOTE — BHH Group Notes (Signed)
[place] Group Notes:  (Nursing/MHT/Case Management/Adjunct)  Date:  10/19/2014  Time:  09:30  Type of Therapy:  Nurse Education - Goals/Coping Skills   Participation Level:  Active   Participation Quality:  Appropriate  Affect:  Appropriate  Cognitive:  Alert  Insight:  Appropriate  Engagement in Group:  Engaged  Modes of Intervention:  Discussion, Education and Support  Summary of Progress/Problems: Discussed goal setting and reviewed the SMART process for goals. Also reviewed healthy coping skills and tools for success as applied to a real life problem solving scenario.  Jamie Kato 10/19/2014, 3:50 PM

## 2014-10-19 NOTE — Progress Notes (Signed)
D: Pt presents anxious in affect with an anxious and depressed mood. Pt reports anxiety r/t going home on Saturday. Pt complained of some generalized itching that she received vistaril for. Pt actively participated within the milieu this evening. Pt is denying any SI/HI/AVH.  A: Writer administered scheduled and prn medications to pt, per MD orders. Continued support and availability as needed was extended to this pt. Staff continue to monitor pt with q50min checks.  R: No adverse drug reactions noted. Pt receptive to treatment. Pt remains safe at this time.
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
## 2014-10-19 NOTE — Discharge Summary (Addendum)
Physician Discharge Summary Note  Patient:  [name] is an 49 y.o., female MRN:  [id number] DOB:  Jun 22, 1966 Patient phone:  [phone] (home)  Patient address:   [place] [address],  Total Time spent with patient: 30 minutes  Date of Admission:  10/09/2014 Date of Discharge: 10-19-14  Reason for Admission:   Patient admitted after presenting in the ED with  coffee ground emesis and black stools, burning chest pain and suicidal ideation.  Reported taking several Tylenol PM over the past week, and family member found empty tylenol bottles in her home.  Patient has h/o OD with Tylenol in the past.  Principal Problem: <principal problem not specified> Discharge Diagnoses: Patient Active Problem List   Diagnosis Date Noted  . Insomnia [G47.00]   . Chronic pain syndrome [G89.4]   . Bipolar 1 disorder, mixed, severe [F31.63] 10/09/2014  . Acute blood loss anemia [D62] 10/07/2014  . Liver failure, acute [K72.00]   . GI bleed [K92.2] 10/06/2014  . Duodenal ulcer hemorrhage [K26.4] 10/06/2014  . Vomiting [R11.10] 10/05/2014  . Acute hepatitis [K72.00] 10/05/2014  . Acute renal insufficiency [N28.9] 10/05/2014  . Seizure disorder [G40.909] 10/05/2014  . Hyperammonemia [E72.20] 10/05/2014  . Suicidal ideation [R45.851] 10/05/2014  . Bipolar disorder [F31.9] 10/01/2014  . Major depression [F32.2] 10/01/2014  . Nausea and vomiting [R11.2] 09/30/2014  . Diarrhea [R19.7] 09/30/2014  . SIRS (systemic inflammatory response syndrome) [A41.9] 07/15/2014  . Tylenol overdose [T39.1X4A] 07/13/2014  . Hypokalemia [E87.6] 07/13/2014  . Depression [F32.9] 05/25/2012    Musculoskeletal: Strength & Muscle Tone: within normal limits Gait & Station: normal Patient leans: N/A  Psychiatric Specialty Exam: Physical Exam  Constitutional: She is oriented to person, place, and time. She appears well-developed and well-nourished.  HENT:  Head: Normocephalic.  Neck:  Normal range of motion.  Musculoskeletal: Normal range of motion.  Neurological: She is alert and oriented to person, place, and time.  Skin: Skin is warm and dry.    ROS  Blood pressure 135/90, pulse 107, temperature 98.1 F (36.7 C), temperature source Oral, resp. rate 15, height 5\' 2"  (1.575 m), weight 53.524 kg (118 lb).Body mass index is 21.58 kg/(m^2).  General Appearance: Casual  Eye Contact::  Good  Speech:  Normal Rate  Volume:  Normal  Mood:  Euthymic  Affect:  Congruent  Thought Process:  Goal Directed  Orientation:  Full (Time, Place, and Person)  Thought Content:  Negative  Suicidal Thoughts:  No  Homicidal Thoughts:  No  Memory:  Immediate;   Good Recent;   Good Remote;   Good  Judgement:  Fair  Insight:  Fair  Psychomotor Activity:  Normal  Concentration:  Good  Recall:  Good  Fund of Knowledge:Good  Language: Good  Akathisia:  Negative  Handed:  Right  AIMS (if indicated):     Assets:  Communication Skills Desire for Improvement Housing Physical Health Resilience Transportation  ADL's:  Intact  Cognition: WNL  Sleep:  Number of Hours: 5.5   Past Medical History:  Past Medical History  Diagnosis Date  . Migraines   . Endometriosis   . ETOH abuse     sober 3 1/2 years  . Anorexia   . Depression   . Anxiety   . DJD (degenerative joint disease) of cervical spine   . Seizures   . PICC (peripherally inserted central catheter) in place     rt neck    Past Surgical History  Procedure Laterality Date  .  Knee surgery    . Abdominal surgery    . Cholecystectomy    . Appendectomy    . Abdominal hysterectomy    . Nasal sinus surgery    . Laproscopy    . Carpal tunnel release      2010  . Esophagogastroduodenoscopy N/A 10/06/2014    Procedure: ESOPHAGOGASTRODUODENOSCOPY (EGD);  Surgeon: Inda Castle, MD;  Location: [facility];  Service: Endoscopy;  Laterality: N/A;   Family History:  Family History  Problem Relation Age of Onset  .  Hypertension Mother   . Hyperlipidemia Mother   . Heart failure Father   . Cancer Other    Social History:  History  Alcohol Use No     History  Drug Use No    History   Social History  . Marital Status: Divorced    Spouse Name: N/A    Number of Children: N/A  . Years of Education: N/A   Social History Main Topics  . Smoking status: Current Every Day Smoker -- 0.25 packs/day for 20 years    Types: Cigarettes  . Smokeless tobacco: Never Used  . Alcohol Use: No  . Drug Use: No  . Sexual Activity: None   Other Topics Concern  . None   Social History Narrative    Past Psychiatric History: Hospitalizations:  Several past admissions  Outpatient Care: Dr Sabra Heck and [facility]  Substance Abuse Care:  [facility]  Self-Mutilation:  Suicidal Attempts: yes  Violent Behaviors: no   Risk to Self: Is patient at risk for suicide?: Yes What has been your use of drugs/alcohol within the last 12 months?: In revocery from alcohol and prescription medications for 3 years Risk to Others:   Prior Inpatient Therapy:   Prior Outpatient Therapy:    Level of Care:  OP  Hospital Course:  Patient has been active in hospital stay.  Participated in individual and group therapy.  She acknowledges her need for  continued support through AA.  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  She tells me her employer is supportive.  She plans to discharge home todauy and her sponsor will pick her up as escort. She plans to seek OP care at [facility]:  None  Significant Diagnostic Studies:  labs: labs reviewed  Discharge Vitals:   Blood pressure 135/90, pulse 107, temperature 98.1 F (36.7 C), temperature source Oral, resp. rate 15, height 5\' 2"  (1.575 m), weight 53.524 kg (118 lb). Body mass index is 21.58 kg/(m^2). Lab Results:   No results found for this or any previous visit (from the past 72 hour(s)).  Physical Findings: AIMS: Facial and  Oral Movements Muscles of Facial Expression: None, normal Lips and Perioral Area: None, normal Jaw: None, normal Tongue: None, normal,Extremity Movements Upper (arms, wrists, hands, fingers): None, normal Lower (legs, knees, ankles, toes): None, normal, Trunk Movements Neck, shoulders, hips: None, normal, Overall Severity Severity of abnormal movements (highest score from questions above): None, normal Incapacitation due to abnormal movements: None, normal Patient's awareness of abnormal movements (rate only patient's report): No Awareness, Dental Status Current problems with teeth and/or dentures?: Yes (broken teeth) Does patient usually wear dentures?: No  CIWA:    COWS:      See Psychiatric Specialty Exam and Suicide Risk Assessment completed by Attending Physician prior to discharge.  Discharge destination:  Home  Is patient on multiple antipsychotic therapies at discharge:  No  Has Patient had three or more failed trials of antipsychotic monotherapy by history:  No    Recommended Plan for Multiple Antipsychotic Therapies: NA  Discharge Instructions    Diet - low sodium heart healthy    Complete by:  As directed      Increase activity slowly    Complete by:  As directed             Medication List    STOP taking these medications        feeding supplement (ENSURE COMPLETE) Liqd      TAKE these medications      Indication   alum & mag hydroxide-simeth 200-200-20 MG/5ML suspension  Commonly known as:  MAALOX/MYLANTA  Take 30 mLs by mouth every 4 (four) hours as needed for indigestion.      amitriptyline 50 MG tablet  Commonly known as:  ELAVIL  Take 1 tablet (50 mg total) by mouth at bedtime.   Indication:  Trouble Sleeping     busPIRone 7.5 MG tablet  Commonly known as:  BUSPAR  Take 1 tablet (7.5 mg total) by mouth 2 (two) times daily.   Indication:  Depression     clonazePAM 1 MG tablet  Commonly known as:  KLONOPIN  Take 1 tablet (1 mg total) by  mouth 3 (three) times daily as needed (anxiety).   Indication:  Manic-Depression     FLUoxetine 20 MG capsule  Commonly known as:  PROZAC  Take 1 capsule (20 mg total) by mouth daily.   Indication:  Depressive Phase of Manic-Depression     gabapentin 100 MG capsule  Commonly known as:  NEURONTIN  Take 1 capsule (100 mg total) by mouth 3 (three) times daily.   Indication:  Neuropathic Pain     levETIRAcetam 500 MG tablet  Commonly known as:  KEPPRA  Take 1 tablet (500 mg total) by mouth 2 (two) times daily.   Indication:  Prevention of Seizures After Bleed in Brain     methocarbamol 500 MG tablet  Commonly known as:  ROBAXIN  Take 1 tablet (500 mg total) by mouth every 6 (six) hours as needed for muscle spasms.   Indication:  Musculoskeletal Pain     OXcarbazepine 150 MG tablet  Commonly known as:  TRILEPTAL  Take 1 tablet (150 mg total) by mouth 2 (two) times daily.   Indication:  Manic-Depression     pantoprazole 40 MG tablet  Commonly known as:  PROTONIX  Take 1 tablet (40 mg total) by mouth 2 (two) times daily.   Indication:  Gastroesophageal Reflux Disease           Follow-up Information    Follow up with [facility] [place].   Why:  Walk in clinic between 8 - 12 and 1 - 3 Monday through Friday for assessment and services.   Contact information:   [address] [place], [place] [address] Phone:  [phone]      Follow-up recommendations:  Activity:  as toelrated  Diet:  heart healthy  Comments:  Patient is looking forward to discharge.  She is positive and optimistic.  She recognizes her continued need for OP care from both [facility] and [facility] in hopes of her mental health maintenance.  She plans to incorporate gratitude and positive thinking into her daily plan.   Total Discharge Time: 41 Minutes  Signed: [place], Ramos 10/19/2014, 9:48 AM  I personally assessed the patient and formulated the plan Geralyn Flash A. Sabra Heck, M.D.

## 2014-10-23 NOTE — Progress Notes (Signed)
Patient Discharge Instructions:  After Visit Summary (AVS):   Faxed to:  10/23/14 Discharge Summary Note:   Faxed to:  10/23/14 Psychiatric Admission Assessment Note:   Faxed to:  10/23/14 Suicide Risk Assessment - Discharge Assessment:   Faxed to:  10/23/14 Faxed/Sent to the Next Level Care provider:  10/23/14 Faxed to [facility] [place], 10/23/2014, 3:44 PM

## 2014-10-26 ENCOUNTER — Inpatient Hospital Stay (HOSPITAL_COMMUNITY)
Admission: EM | Admit: 2014-10-26 | Discharge: 2014-10-29 | DRG: 918 | Disposition: A | Discharge status: Other Acute Inpatient Hospital | Payer: Federal, State, Local not specified - Other | Attending: Family Medicine | Admitting: Family Medicine

## 2014-10-26 ENCOUNTER — Encounter (HOSPITAL_COMMUNITY): Payer: Self-pay | Admitting: Emergency Medicine

## 2014-10-26 DIAGNOSIS — Z8249 Family history of ischemic heart disease and other diseases of the circulatory system: Secondary | ICD-10-CM

## 2014-10-26 DIAGNOSIS — R748 Abnormal levels of other serum enzymes: Secondary | ICD-10-CM

## 2014-10-26 DIAGNOSIS — E876 Hypokalemia: Secondary | ICD-10-CM

## 2014-10-26 DIAGNOSIS — Y92009 Unspecified place in unspecified non-institutional (private) residence as the place of occurrence of the external cause: Secondary | ICD-10-CM

## 2014-10-26 DIAGNOSIS — F419 Anxiety disorder, unspecified: Secondary | ICD-10-CM | POA: Diagnosis present

## 2014-10-26 DIAGNOSIS — Z79899 Other long term (current) drug therapy: Secondary | ICD-10-CM

## 2014-10-26 DIAGNOSIS — F17210 Nicotine dependence, cigarettes, uncomplicated: Secondary | ICD-10-CM | POA: Diagnosis present

## 2014-10-26 DIAGNOSIS — T391X1A Poisoning by 4-Aminophenol derivatives, accidental (unintentional), initial encounter: Secondary | ICD-10-CM | POA: Diagnosis present

## 2014-10-26 DIAGNOSIS — F329 Major depressive disorder, single episode, unspecified: Secondary | ICD-10-CM | POA: Diagnosis present

## 2014-10-26 DIAGNOSIS — F332 Major depressive disorder, recurrent severe without psychotic features: Secondary | ICD-10-CM | POA: Diagnosis present

## 2014-10-26 DIAGNOSIS — T391X2A Poisoning by 4-Aminophenol derivatives, intentional self-harm, initial encounter: Principal | ICD-10-CM

## 2014-10-26 DIAGNOSIS — G40909 Epilepsy, unspecified, not intractable, without status epilepticus: Secondary | ICD-10-CM

## 2014-10-26 DIAGNOSIS — F319 Bipolar disorder, unspecified: Secondary | ICD-10-CM

## 2014-10-26 LAB — PROTIME-INR
INR: 0.98 (ref 0.00–1.49)
Prothrombin Time: 13.1 seconds (ref 11.6–15.2)

## 2014-10-26 LAB — SALICYLATE LEVEL: Salicylate Lvl: <4 mg/dL (ref 2.8–20.0)

## 2014-10-26 LAB — CBC WITH DIFFERENTIAL/PLATELET
Basophils Absolute: 0.1 10*3/uL (ref 0.0–0.1)
Basophils Relative: 1 % (ref 0–1)
EOS ABS: 0.1 10*3/uL (ref 0.0–0.7)
EOS PCT: 1 % (ref 0–5)
HEMATOCRIT: 44 % (ref 36.0–46.0)
Hemoglobin: 14.9 g/dL (ref 12.0–15.0)
LYMPHS ABS: 1.8 10*3/uL (ref 0.7–4.0)
LYMPHS PCT: 22 % (ref 12–46)
MCH: 32.7 pg (ref 26.0–34.0)
MCHC: 33.9 g/dL (ref 30.0–36.0)
MCV: 96.7 fL (ref 78.0–100.0)
MONO ABS: 0.4 10*3/uL (ref 0.1–1.0)
MONOS PCT: 5 % (ref 3–12)
NEUTROS ABS: 6 10*3/uL (ref 1.7–7.7)
Neutrophils Relative %: 71 % (ref 43–77)
Platelets: 401 10*3/uL — ABNORMAL HIGH (ref 150–400)
RBC: 4.55 MIL/uL (ref 3.87–5.11)
RDW: 15.7 % — ABNORMAL HIGH (ref 11.5–15.5)
WBC: 8.4 10*3/uL (ref 4.0–10.5)

## 2014-10-26 LAB — COMPREHENSIVE METABOLIC PANEL
ALBUMIN: 4.8 g/dL (ref 3.5–5.2)
ALT: 1441 U/L — ABNORMAL HIGH (ref 0–35)
ALT: 1060 U/L — AB (ref 0–35)
ANION GAP: 13 (ref 5–15)
AST: 1284 U/L — ABNORMAL HIGH (ref 0–37)
AST: 827 U/L — ABNORMAL HIGH (ref 0–37)
Albumin: 3.6 g/dL (ref 3.5–5.2)
Alkaline Phosphatase: 185 U/L — ABNORMAL HIGH (ref 39–117)
Alkaline Phosphatase: 119 U/L — ABNORMAL HIGH (ref 39–117)
Anion gap: 13 (ref 5–15)
BUN: 12 mg/dL (ref 6–23)
BUN: 11 mg/dL (ref 6–23)
CALCIUM: 9 mg/dL (ref 8.4–10.5)
CO2: 24 mmol/L (ref 19–32)
CO2: 21 mmol/L (ref 19–32)
Calcium: 9.9 mg/dL (ref 8.4–10.5)
Chloride: 97 mmol/L (ref 96–112)
Chloride: 104 mmol/L (ref 96–112)
Creatinine, Ser: 0.79 mg/dL (ref 0.50–1.10)
Creatinine, Ser: 0.53 mg/dL (ref 0.50–1.10)
GFR calc Af Amer: >90 mL/min (ref >90)
GFR calc non Af Amer: >90 mL/min (ref >90)
GFR calc non Af Amer: >90 mL/min (ref >90)
GLUCOSE: 113 mg/dL — AB (ref 70–99)
Glucose, Bld: 96 mg/dL (ref 70–99)
Potassium: 3.3 mmol/L — ABNORMAL LOW (ref 3.5–5.1)
Potassium: 4.3 mmol/L (ref 3.5–5.1)
SODIUM: 138 mmol/L (ref 135–145)
Sodium: 134 mmol/L — ABNORMAL LOW (ref 135–145)
TOTAL PROTEIN: 6.2 g/dL (ref 6.0–8.3)
Total Bilirubin: 1.8 mg/dL — ABNORMAL HIGH (ref 0.3–1.2)
Total Bilirubin: 2.1 mg/dL — ABNORMAL HIGH (ref 0.3–1.2)
Total Protein: 8 g/dL (ref 6.0–8.3)

## 2014-10-26 LAB — RAPID URINE DRUG SCREEN, HOSP PERFORMED
Amphetamines: NOT DETECTED
Barbiturates: NOT DETECTED
Benzodiazepines: NOT DETECTED
COCAINE: NOT DETECTED
OPIATES: NOT DETECTED
TETRAHYDROCANNABINOL: NOT DETECTED

## 2014-10-26 LAB — URINALYSIS, ROUTINE W REFLEX MICROSCOPIC
Bilirubin Urine: NEGATIVE
Glucose, UA: NEGATIVE mg/dL
Hgb urine dipstick: NEGATIVE
KETONES UR: 40 mg/dL — AB
Nitrite: NEGATIVE
Protein, ur: NEGATIVE mg/dL
Specific Gravity, Urine: 1.025 (ref 1.005–1.030)
Urobilinogen, UA: 1 mg/dL (ref 0.0–1.0)
pH: 6 (ref 5.0–8.0)

## 2014-10-26 LAB — PREGNANCY, URINE: Preg Test, Ur: NEGATIVE

## 2014-10-26 LAB — ACETAMINOPHEN LEVEL: Acetaminophen (Tylenol), Serum: <10 ug/mL — ABNORMAL LOW (ref 10–30)

## 2014-10-26 LAB — MRSA PCR SCREENING: MRSA by PCR: NEGATIVE

## 2014-10-26 LAB — URINE MICROSCOPIC-ADD ON

## 2014-10-26 LAB — ETHANOL

## 2014-10-26 MED ORDER — ONDANSETRON HCL 4 MG/2ML IJ SOLN: 4.0000 mg | Freq: Once | INTRAMUSCULAR | Status: DC

## 2014-10-26 MED ORDER — LORAZEPAM 2 MG/ML IJ SOLN
0.5000 mg | Freq: Once | INTRAMUSCULAR | Status: AC
  Administered 2014-10-26: 0.5 mg via INTRAVENOUS
  Filled 2014-10-26: qty 1

## 2014-10-26 MED ORDER — ACETYLCYSTEINE 20 % IN SOLN
70.0000 mg/kg | RESPIRATORY_TRACT | Status: DC
Start: 2014-10-26 — End: 2014-10-26
  Filled 2014-10-26 (×4): qty 20

## 2014-10-26 MED ORDER — SODIUM CHLORIDE 0.9 % IV BOLUS (SEPSIS)
1000.0000 mL | Freq: Once | INTRAVENOUS | Status: AC
  Administered 2014-10-26: 1000 mL via INTRAVENOUS

## 2014-10-26 MED ORDER — SODIUM CHLORIDE 0.9 % IV SOLN: 250.0000 mL | INTRAVENOUS | Status: DC | PRN

## 2014-10-26 MED ORDER — ONDANSETRON HCL 4 MG/2ML IJ SOLN
4.0000 mg | Freq: Four times a day (QID) | INTRAMUSCULAR | Status: DC | PRN
  Administered 2014-10-26 – 2014-10-29 (×4): 4 mg via INTRAVENOUS
  Filled 2014-10-26 (×5): qty 2

## 2014-10-26 MED ORDER — ONDANSETRON HCL 4 MG PO TABS
4.0000 mg | ORAL_TABLET | Freq: Four times a day (QID) | ORAL | Status: DC | PRN
  Administered 2014-10-27: 4 mg via ORAL
  Filled 2014-10-26: qty 1

## 2014-10-26 MED ORDER — PROMETHAZINE HCL 25 MG/ML IJ SOLN
12.5000 mg | Freq: Once | INTRAMUSCULAR | Status: AC
  Administered 2014-10-27: 12.5 mg via INTRAVENOUS
  Filled 2014-10-26: qty 1

## 2014-10-26 MED ORDER — LEVETIRACETAM 500 MG PO TABS
500.0000 mg | ORAL_TABLET | Freq: Two times a day (BID) | ORAL | Status: DC
  Administered 2014-10-27 – 2014-10-29 (×5): 500 mg via ORAL
  Filled 2014-10-26 (×6): qty 1

## 2014-10-26 MED ORDER — SODIUM CHLORIDE 0.9 % IJ SOLN
3.0000 mL | INTRAMUSCULAR | Status: DC | PRN
  Administered 2014-10-29: 3 mL via INTRAVENOUS
  Filled 2014-10-26: qty 3

## 2014-10-26 MED ORDER — SODIUM CHLORIDE 0.9 % IJ SOLN
3.0000 mL | Freq: Two times a day (BID) | INTRAMUSCULAR | Status: DC
  Administered 2014-10-27 – 2014-10-28 (×2): 3 mL via INTRAVENOUS

## 2014-10-26 MED ORDER — POTASSIUM CHLORIDE 10 MEQ/100ML IV SOLN: 10.0000 meq | INTRAVENOUS | Status: DC

## 2014-10-26 MED ORDER — SODIUM CHLORIDE 0.9 % IV SOLN: INTRAVENOUS | Status: DC

## 2014-10-26 MED ORDER — POTASSIUM CHLORIDE 10 MEQ/100ML IV SOLN
10.0000 meq | INTRAVENOUS | Status: AC
  Filled 2014-10-26: qty 100

## 2014-10-26 MED ORDER — DEXTROSE 5 % IV SOLN: 15.0000 mg/kg/h | INTRAVENOUS | Status: DC

## 2014-10-26 MED ORDER — DEXTROSE 5 % IV SOLN
15.0000 mg/kg/h | INTRAVENOUS | Status: DC
  Administered 2014-10-26: 15 mg/kg/h via INTRAVENOUS

## 2014-10-26 MED ORDER — ACETYLCYSTEINE LOAD VIA INFUSION: 150.0000 mg/kg | Freq: Once | INTRAVENOUS | Status: DC

## 2014-10-26 MED ORDER — ACETYLCYSTEINE LOAD VIA INFUSION
150.0000 mg/kg | Freq: Once | INTRAVENOUS | Status: AC
  Administered 2014-10-26: 8025 mg via INTRAVENOUS
  Filled 2014-10-26: qty 201

## 2014-10-26 MED ORDER — SODIUM CHLORIDE 0.9 % IJ SOLN
3.0000 mL | Freq: Two times a day (BID) | INTRAMUSCULAR | Status: DC
  Administered 2014-10-26 – 2014-10-28 (×3): 3 mL via INTRAVENOUS

## 2014-10-26 MED ORDER — ACETYLCYSTEINE 20 % IN SOLN
140.0000 mg/kg | Freq: Once | RESPIRATORY_TRACT | Status: DC
  Filled 2014-10-26: qty 40

## 2014-10-26 MED ORDER — DEXTROSE 5 % IV SOLN
15.0000 mg/kg/h | INTRAVENOUS | Status: DC
  Administered 2014-10-26: 15 mg/kg/h via INTRAVENOUS
  Filled 2014-10-26: qty 150

## 2014-10-26 MED ORDER — ONDANSETRON HCL 4 MG/2ML IJ SOLN
4.0000 mg | Freq: Once | INTRAMUSCULAR | Status: AC
  Administered 2014-10-26: 4 mg via INTRAVENOUS
  Filled 2014-10-26: qty 2

## 2014-10-26 NOTE — H&P (Signed)
[facility] History and Physical  [name] JJK:[id number] DOB: [birth year] DOA: 10/26/2014  Referring physician/ED personnel: Verdene Rio PCP: No primary care provider on file.   Chief Complaint: Intentional acetaminophen overdose  HPI: [name] is a 49 y.o. female  With history of prior intentional acetaminophen overdose and subsequent admissions, bipolar disorder, depression, seizure disorder. Presented to the hospital after developing nausea secondary to intentional acetaminophen overdose. She reports that the day prior to admission during the late afternoon she ingested a large amount of acetaminophen. Based on [facility] EMR and documentation patient took 50 pills of 500 mg acetaminophen. She reports that she just did not want to be here anymore. Currently she is more concerned about her nausea and abdominal discomfort.  Given chief complaint we're consulted for medical evaluation and recommendations. I have consulted psychiatry for further evaluation recommendations from their standpoint   Review of Systems:  Constitutional:  No weight loss, night sweats, Fevers, chills, fatigue.  HEENT:  No headaches, Difficulty swallowing,Tooth/dental problems,Sore throat,  No sneezing, itching, ear ache, nasal congestion, post nasal drip,  Cardio-vascular:  No chest pain, Orthopnea, PND, swelling in lower extremities, anasarca, dizziness, palpitations  GI:  No heartburn, indigestion, + abdominal pain, + nausea, + vomiting, diarrhea, change in bowel habits, + loss of appetite  Resp:  No shortness of breath with exertion or at rest. No excess mucus, no productive cough, No non-productive cough, No coughing up of blood.No change in color of mucus.No wheezing.No chest wall deformity  Skin:  no rash or lesions.  GU:  no dysuria, change in color of urine, no urgency or frequency. No flank pain.  Musculoskeletal:  No joint pain or swelling. No decreased range of motion. No back pain.    Psych:  No change in mood or affect. No depression or anxiety. No memory loss.   Past Medical History  Diagnosis Date  . Migraines   . Endometriosis   . ETOH abuse     sober 3 1/2 years  . Anorexia   . Depression   . Anxiety   . DJD (degenerative joint disease) of cervical spine   . Seizures   . PICC (peripherally inserted central catheter) in place     rt neck   Past Surgical History  Procedure Laterality Date  . Knee surgery    . Abdominal surgery    . Cholecystectomy    . Appendectomy    . Abdominal hysterectomy    . Nasal sinus surgery    . Laproscopy    . Carpal tunnel release      2010  . Esophagogastroduodenoscopy N/A 10/06/2014    Procedure: ESOPHAGOGASTRODUODENOSCOPY (EGD);  Surgeon: Inda Castle, MD;  Location: [facility];  Service: Endoscopy;  Laterality: N/A;   Social History:  reports that she has been smoking Cigarettes.  She has a 5 pack-year smoking history. She has never used smokeless tobacco. She reports that she does not drink alcohol or use illicit drugs.  Allergies  Allergen Reactions  . Aspirin Anaphylaxis  . Doxycycline Anaphylaxis  . Imitrex [Sumatriptan Base] Other (See Comments)    Severe hypertension  . Iodinated Diagnostic Agents Anaphylaxis  . Lidocaine Anaphylaxis    Pt states she does well with Marcaine/Bupivicaine without problem  . Prednisone Other (See Comments)    She goes crazy  . Sulfa Antibiotics Anaphylaxis  . Sulfasalazine Anaphylaxis  . Sumatriptan Other (See Comments)    Felt like she was "having a stroke", heart rate and blood pressure "  went through the roof"  . Tramadol Other (See Comments)    seizures  . Levofloxacin Other (See Comments)    Joints hurt  . Latex Rash  . Levofloxacin Rash and Other (See Comments)    Joints hurt  . Metrizamide Other (See Comments)  . Nsaids Rash and Other (See Comments)    GI bleed  . Tolmetin Rash    Family History  Problem Relation Age of Onset  . Hypertension Mother    . Hyperlipidemia Mother   . Heart failure Father   . Cancer Other     Prior to Admission medications   Medication Sig Start Date End Date Taking? Authorizing Provider  amitriptyline (ELAVIL) 50 MG tablet Take 1 tablet (50 mg total) by mouth at bedtime. 10/19/14  Yes Knox Royalty, NP  busPIRone (BUSPAR) 7.5 MG tablet Take 1 tablet (7.5 mg total) by mouth 2 (two) times daily. 10/19/14  Yes Knox Royalty, NP  carisoprodol (SOMA) 350 MG tablet Take 1 tablet (350 mg total) by mouth at bedtime. 10/19/14  Yes Knox Royalty, NP  clonazePAM (KLONOPIN) 1 MG tablet Take 1 tablet (1 mg total) by mouth 3 (three) times daily as needed (anxiety). 10/19/14  Yes Knox Royalty, NP  FLUoxetine (PROZAC) 20 MG capsule Take 1 capsule (20 mg total) by mouth daily. 10/19/14  Yes Knox Royalty, NP  gabapentin (NEURONTIN) 100 MG capsule Take 1 capsule (100 mg total) by mouth 3 (three) times daily. 10/19/14  Yes Knox Royalty, NP  levETIRAcetam (KEPPRA) 500 MG tablet Take 1 tablet (500 mg total) by mouth 2 (two) times daily. 10/19/14  Yes Knox Royalty, NP  OXcarbazepine (TRILEPTAL) 150 MG tablet Take 1 tablet (150 mg total) by mouth 2 (two) times daily. 10/19/14  Yes Knox Royalty, NP  oxyCODONE-acetaminophen (PERCOCET/ROXICET) 5-325 MG per tablet Take 1 tablet by mouth 3 (three) times daily as needed for severe pain.   Yes Historical Provider, MD  pantoprazole (PROTONIX) 40 MG tablet Take 1 tablet (40 mg total) by mouth 2 (two) times daily. Patient not taking: Reported on 10/26/2014 10/19/14   Knox Royalty, NP   Physical Exam: Filed Vitals:   10/26/14 1346 10/26/14 1527  BP: 129/78 130/82  Pulse: 107 100  Temp: 97.7 F (36.5 C)   TempSrc: Oral   Resp: 20 16  Height: 5\' 2"  (1.575 m)   Weight: 53.5 kg (117 lb 15.1 oz)   SpO2: 95% 99%    Wt Readings from Last 3 Encounters:  10/26/14 53.5 kg (117 lb 15.1 oz)  10/09/14 53.524 kg (118 lb)  10/01/14 56.836 kg (125 lb 4.8 oz)    General:  Appears calm and  comfortable Eyes: Patient had sunglasses on as such unable to assess well as patient cooperation limited ENT: grossly normal hearing, lips & tongue Neck: no LAD, masses or thyromegaly Cardiovascular: RRR, no m/r/g. No LE edema. Telemetry: Sinus tachycardia, no arrhythmias  Respiratory: CTA bilaterally, no w/r/r. Normal respiratory effort. Abdomen: soft, no guarding, generalized abdominal discomfort, positive bowel sounds Skin: no rash or induration seen on limited exam Musculoskeletal: grossly normal tone BUE/BLE Psychiatric: Patient has flat affect with limited responses to questions Neurologic: No facial asymmetry answers questions appropriately, moves extremities equally           Labs on Admission:  Basic Metabolic Panel:  Recent Labs Lab 10/26/14 1404  NA 134*  K 3.3*  CL 97  CO2 24  GLUCOSE 96  BUN 12  CREATININE 0.79  CALCIUM 9.9   Liver Function Tests:  Recent Labs Lab 10/26/14 1404  AST 1284*  ALT 1441*  ALKPHOS 185*  BILITOT 1.8*  PROT 8.0  ALBUMIN 4.8   No results for input(s): LIPASE, AMYLASE in the last 168 hours. No results for input(s): AMMONIA in the last 168 hours. CBC:  Recent Labs Lab 10/26/14 1404  WBC 8.4  NEUTROABS 6.0  HGB 14.9  HCT 44.0  MCV 96.7  PLT 401*   Cardiac Enzymes: No results for input(s): CKTOTAL, CKMB, CKMBINDEX, TROPONINI in the last 168 hours.  BNP (last 3 results) No results for input(s): BNP in the last 8760 hours.  ProBNP (last 3 results) No results for input(s): PROBNP in the last 8760 hours.  CBG: No results for input(s): GLUCAP in the last 168 hours.  Radiological Exams on Admission: No results found.  EKG: Independently reviewed. Sinus Tachycardia with no ST elevations or depressions  Assessment/Plan Principal Problem:    Intentional acetaminophen overdose - Psychiatry consulted - Pharmacy consult for acetylcysteine - Step down monitoring - bed rest, telemetry monitoring - psychiatry consult    Active Problems:   Hypokalemia - Currently with nausea as such will not replace orally - Replace potassium IV route and reassess    Bipolar disorder - Hold oral regimen given principle problem and liver injury - Psychiatry consulted.    Major depression - hold oral regimen at this juncture given principle problem and liver injury - Psychiatry consulted.    Seizure disorder - Continue Keppra next am.   Code Status: full DVT Prophylaxis:SCD's Family Communication: None at bedside Disposition Plan: Stepdown on acetylcysteine with Psychiatry consult  Time spent: > 45  Velvet Bathe [facility] Pager [phone]

## 2014-10-26 NOTE — ED Notes (Signed)
Patient has a black/navy pocketbook. There is $2 cash, a [employer] with a gray case, brown wallet- in wallet there are 2 books of checks,insurance cards x2,business cards, planner,notebook,.pen,stamps,keys,car insurance card,car registration, black reading glasses. Pink Danskin jacket,white t shirt, tennis shoes,socks, and black yoga pants.

## 2014-10-26 NOTE — ED Provider Notes (Signed)
CSN: [id number]     Arrival date & time 10/26/14  1343 History   First MD Initiated Contact with Patient 10/26/14 1345     Chief Complaint  Patient presents with  . Drug Overdose     (Consider location/radiation/quality/duration/timing/severity/associated sxs/prior Treatment) HPI    PCP: No primary care provider on file. Blood pressure 129/78, pulse 107, temperature 97.7 F (36.5 C), temperature source Oral, resp. rate 20, height 5\' 2"  (1.575 m), weight 117 lb 15.1 oz (53.5 kg), SpO2 95 %.  [name] is a 49 y.o.female with a significant PMH of migraines, endometriosis, ETOH- sober for 3 years, depression, anorexia, anxiety, DJD, seizures, PICC presents to the ER by [facility] after a Tylenol overdose. Per [facility] that she is on a transplant list for liver failure. Patient states that had elevated liver enzymes due to OD on Tylenol in the past and that she was told they returned to baseline. She reports laying in bed for the last 6-7 days. Yesterday sometime after lunch she took 50 x 500 mg Tylenol. She also reports unknown amount of Tylenol PM in attempt to kill herself. Reports she no longer wants to be alive. Her landlord found her when he had been unable to get in contact with her this week. She denies taking too much of any other medications. She is having photophobia, abdominal pain and numerous episodes of vomiting (> 30 per pt). She is currently awake and oriented.   Past Medical History  Diagnosis Date  . Migraines   . Endometriosis   . ETOH abuse     sober 3 1/2 years  . Anorexia   . Depression   . Anxiety   . DJD (degenerative joint disease) of cervical spine   . Seizures   . PICC (peripherally inserted central catheter) in place     rt neck   Past Surgical History  Procedure Laterality Date  . Knee surgery    . Abdominal surgery    . Cholecystectomy    . Appendectomy    . Abdominal hysterectomy    . Nasal sinus surgery    . Laproscopy    . Carpal tunnel release     2010  . Esophagogastroduodenoscopy N/A 10/06/2014    Procedure: ESOPHAGOGASTRODUODENOSCOPY (EGD);  Surgeon: Inda Castle, MD;  Location: [place];  Service: Endoscopy;  Laterality: N/A;   Family History  Problem Relation Age of Onset  . Hypertension Mother   . Hyperlipidemia Mother   . Heart failure Father   . Cancer Other    History  Substance Use Topics  . Smoking status: Current Every Day Smoker -- 0.25 packs/day for 20 years    Types: Cigarettes  . Smokeless tobacco: Never Used  . Alcohol Use: No   OB History    No data available     Review of Systems  10 Systems reviewed and are negative for acute change except as noted in the HPI.    Allergies  Aspirin; Doxycycline; Imitrex; Iodinated diagnostic agents; Lidocaine; Prednisone; Sulfa antibiotics; Sulfasalazine; Sumatriptan; Tramadol; Levofloxacin; Latex; Levofloxacin; Metrizamide; Nsaids; and Tolmetin  Home Medications   Prior to Admission medications   Medication Sig Start Date End Date Taking? Authorizing Provider  amitriptyline (ELAVIL) 50 MG tablet Take 1 tablet (50 mg total) by mouth at bedtime. 10/19/14   Knox Royalty, NP  busPIRone (BUSPAR) 7.5 MG tablet Take 1 tablet (7.5 mg total) by mouth 2 (two) times daily. 10/19/14   Knox Royalty, NP  carisoprodol (  SOMA) 350 MG tablet Take 1 tablet (350 mg total) by mouth at bedtime. 10/19/14   Knox Royalty, NP  clonazePAM (KLONOPIN) 1 MG tablet Take 1 tablet (1 mg total) by mouth 3 (three) times daily as needed (anxiety). 10/19/14   Knox Royalty, NP  FLUoxetine (PROZAC) 20 MG capsule Take 1 capsule (20 mg total) by mouth daily. 10/19/14   Knox Royalty, NP  gabapentin (NEURONTIN) 100 MG capsule Take 1 capsule (100 mg total) by mouth 3 (three) times daily. 10/19/14   Knox Royalty, NP  levETIRAcetam (KEPPRA) 500 MG tablet Take 1 tablet (500 mg total) by mouth 2 (two) times daily. 10/19/14   Knox Royalty, NP  OXcarbazepine (TRILEPTAL) 150 MG tablet Take 1 tablet (150 mg  total) by mouth 2 (two) times daily. 10/19/14   Knox Royalty, NP  pantoprazole (PROTONIX) 40 MG tablet Take 1 tablet (40 mg total) by mouth 2 (two) times daily. 10/19/14   Knox Royalty, NP   BP 129/78 mmHg  Pulse 107  Temp(Src) 97.7 F (36.5 C) (Oral)  Resp 20  Ht 5\' 2"  (1.575 m)  Wt 117 lb 15.1 oz (53.5 kg)  BMI 21.57 kg/m2  SpO2 95% Physical Exam  Constitutional: She appears well-developed and well-nourished. No distress.  HENT:  Head: Normocephalic and atraumatic.  Eyes: Conjunctivae, EOM and lids are normal. Pupils are equal, round, and reactive to light.  Neck: Normal range of motion. Neck supple.  Cardiovascular: Normal rate and regular rhythm.   Pulmonary/Chest: Effort normal and breath sounds normal. She has no decreased breath sounds. She has no wheezes.  Abdominal: Soft. Bowel sounds are normal. She exhibits no distension. There is tenderness (diffuse). There is guarding. There is no rebound.  Neurological: She is alert.  Skin: Skin is warm and dry.  Psychiatric: Her mood appears not anxious. Her speech is not rapid and/or pressured. She is not withdrawn. Thought content is not delusional. She exhibits a depressed mood. She expresses suicidal ideation. She expresses no homicidal ideation. She expresses suicidal plans. She expresses no homicidal plans.  Nursing note and vitals reviewed.     ED Course  Procedures (including critical care time) Labs Review Labs Reviewed  CBC WITH DIFFERENTIAL/PLATELET - Abnormal; Notable for the following:    RDW 15.7 (*)    Platelets 401 (*)    All other components within normal limits  PROTIME-INR  COMPREHENSIVE METABOLIC PANEL  SALICYLATE LEVEL  ACETAMINOPHEN LEVEL  URINALYSIS, ROUTINE W REFLEX MICROSCOPIC  URINE RAPID DRUG SCREEN (HOSP PERFORMED)  PREGNANCY, URINE  ETHANOL    Imaging Review No results found.   EKG Interpretation None      MDM   Final diagnoses:  None   Discussed case with Dr. Aline Brochure, she is  believed to already have liver failure and be on a transplant list per [facility]. I have documented her ALT as 194 previously and AST is 30. No recent values. Pt reports labs have returned to baseline and that she is not a transplant candidate.  Medications  sodium chloride 0.9 % bolus 1,000 mL (1,000 mLs Intravenous New Bag/Given 10/26/14 1442)  acetylcysteine (ACETADOTE) 40 mg/mL load via infusion 8,025 mg (not administered)    Followed by  acetylcysteine (ACETADOTE) 30,000 mg in dextrose 5 % 750 mL (40 mg/mL) infusion (15 mg/kg/hr  53.5 kg Intravenous New Bag/Given 10/26/14 1512)  ondansetron (ZOFRAN) injection 4 mg (4 mg Intravenous Given 10/26/14 1442)   CRITICAL CARE Performed by: Linus Mako Total  critical care time: 45 Critical care time was exclusive of separately billable procedures and treating other patients. Critical care was necessary to treat or prevent imminent or life-threatening deterioration. Critical care was time spent personally by me on the following activities: development of treatment plan with patient and/or surrogate as well as nursing, discussions with consultants, evaluation of patient's response to treatment, examination of patient, obtaining history from patient or surrogate, ordering and performing treatments and interventions, ordering and review of laboratory studies, ordering and review of radiographic studies, pulse oximetry and re-evaluation of patient's condition.   3:23 pm Patients CBC and PT/INR have resulted and show no acute abnormalities. At end of shift Noland Fordyce PA-C will assume care of the patient. Tylenol levels and chemistries are pending. I will initiate IVC papers before leaving.  Linus Mako, PA-C 10/26/14 1525   4:11pm  Prior to my leaving her labs resulted. Her Tylenol level is normal but her AST is 1284 and ALT is 1441. Bili is 1.8. Her PT/INR are within normal limits. She is hemodynamically stable, awake and alert.  Patient admitted  to [facility], inpatient, w/ Dr. Wendee Beavers, medsurg.  Filed Vitals:   10/26/14 1527  BP: 130/82  Pulse: 100  Temp:   Resp: [address], PA-C 10/26/14 1636  Pamella Pert, MD 10/27/14 1023

## 2014-10-26 NOTE — ED Notes (Signed)
Nurse will draw labs. 

## 2014-10-26 NOTE — ED Notes (Signed)
Pt was transported by [facility]. Per [facility] pt took 50 tylenol last night. Pt is alert and oriented. Pt confirmed SI. Per [facility] , they found two umpty bottles of oxycodone and Carisoprodol that were filled on 2/1. Pt is voluntary.

## 2014-10-26 NOTE — ED Notes (Signed)
Bed: XO32 Expected date: 10/26/14 Expected time: 1:40 PM Means of arrival: Ambulance Comments: Tylenol OD

## 2014-10-27 DIAGNOSIS — G894 Chronic pain syndrome: Secondary | ICD-10-CM

## 2014-10-27 LAB — COMPREHENSIVE METABOLIC PANEL
ALBUMIN: 3.5 g/dL (ref 3.5–5.2)
ALBUMIN: 3.5 g/dL (ref 3.5–5.2)
ALK PHOS: 121 U/L — AB (ref 39–117)
ALT: 835 U/L — ABNORMAL HIGH (ref 0–35)
ALT: 741 U/L — ABNORMAL HIGH (ref 0–35)
ALT: 680 U/L — ABNORMAL HIGH (ref 0–35)
AST: 491 U/L — ABNORMAL HIGH (ref 0–37)
AST: 371 U/L — ABNORMAL HIGH (ref 0–37)
AST: 311 U/L — ABNORMAL HIGH (ref 0–37)
Albumin: 3.8 g/dL (ref 3.5–5.2)
Alkaline Phosphatase: 117 U/L (ref 39–117)
Alkaline Phosphatase: 114 U/L (ref 39–117)
Anion gap: 13 (ref 5–15)
Anion gap: 14 (ref 5–15)
Anion gap: 12 (ref 5–15)
BUN: 9 mg/dL (ref 6–23)
BUN: 8 mg/dL (ref 6–23)
BUN: 6 mg/dL (ref 6–23)
CALCIUM: 8.9 mg/dL (ref 8.4–10.5)
CALCIUM: 9.1 mg/dL (ref 8.4–10.5)
CHLORIDE: 100 mmol/L (ref 96–112)
CO2: 24 mmol/L (ref 19–32)
CO2: 25 mmol/L (ref 19–32)
CO2: 24 mmol/L (ref 19–32)
CREATININE: 0.54 mg/dL (ref 0.50–1.10)
Calcium: 9.1 mg/dL (ref 8.4–10.5)
Chloride: 99 mmol/L (ref 96–112)
Chloride: 99 mmol/L (ref 96–112)
Creatinine, Ser: 0.53 mg/dL (ref 0.50–1.10)
Creatinine, Ser: 0.59 mg/dL (ref 0.50–1.10)
GFR calc Af Amer: >90 mL/min (ref >90)
GFR calc Af Amer: >90 mL/min (ref >90)
GFR calc non Af Amer: >90 mL/min (ref >90)
Glucose, Bld: 97 mg/dL (ref 70–99)
Glucose, Bld: 100 mg/dL — ABNORMAL HIGH (ref 70–99)
Glucose, Bld: 87 mg/dL (ref 70–99)
POTASSIUM: 3.4 mmol/L — AB (ref 3.5–5.1)
Potassium: 3.2 mmol/L — ABNORMAL LOW (ref 3.5–5.1)
Potassium: 3 mmol/L — ABNORMAL LOW (ref 3.5–5.1)
SODIUM: 136 mmol/L (ref 135–145)
SODIUM: 138 mmol/L (ref 135–145)
Sodium: 136 mmol/L (ref 135–145)
TOTAL PROTEIN: 6.3 g/dL (ref 6.0–8.3)
Total Bilirubin: 1.7 mg/dL — ABNORMAL HIGH (ref 0.3–1.2)
Total Bilirubin: 1.6 mg/dL — ABNORMAL HIGH (ref 0.3–1.2)
Total Bilirubin: 1.4 mg/dL — ABNORMAL HIGH (ref 0.3–1.2)
Total Protein: 6.6 g/dL (ref 6.0–8.3)
Total Protein: 6.3 g/dL (ref 6.0–8.3)

## 2014-10-27 LAB — CBC
HEMATOCRIT: 36.4 % (ref 36.0–46.0)
Hemoglobin: 12.5 g/dL (ref 12.0–15.0)
MCH: 32.8 pg (ref 26.0–34.0)
MCHC: 34.3 g/dL (ref 30.0–36.0)
MCV: 95.5 fL (ref 78.0–100.0)
PLATELETS: 339 10*3/uL (ref 150–400)
RBC: 3.81 MIL/uL — ABNORMAL LOW (ref 3.87–5.11)
RDW: 15.4 % (ref 11.5–15.5)
WBC: 7 10*3/uL (ref 4.0–10.5)

## 2014-10-27 LAB — SALICYLATE LEVEL

## 2014-10-27 LAB — PROTIME-INR
INR: 1.06 (ref 0.00–1.49)
PROTHROMBIN TIME: 13.9 s (ref 11.6–15.2)

## 2014-10-27 LAB — ACETAMINOPHEN LEVEL: Acetaminophen (Tylenol), Serum: <10 ug/mL — ABNORMAL LOW (ref 10–30)

## 2014-10-27 MED ORDER — POTASSIUM CHLORIDE CRYS ER 20 MEQ PO TBCR
40.0000 meq | EXTENDED_RELEASE_TABLET | Freq: Once | ORAL | Status: AC
  Administered 2014-10-27: 40 meq via ORAL
  Filled 2014-10-27: qty 2

## 2014-10-27 MED ORDER — PANTOPRAZOLE SODIUM 40 MG PO TBEC
40.0000 mg | DELAYED_RELEASE_TABLET | Freq: Every day | ORAL | Status: DC
  Administered 2014-10-27 – 2014-10-29 (×3): 40 mg via ORAL
  Filled 2014-10-27 (×3): qty 1

## 2014-10-27 MED ORDER — FENTANYL 25 MCG/HR TD PT72
25.0000 ug | MEDICATED_PATCH | TRANSDERMAL | Status: DC
  Administered 2014-10-27: 25 ug via TRANSDERMAL
  Filled 2014-10-27: qty 1

## 2014-10-27 NOTE — Progress Notes (Signed)
Pt more hypertensive, last BP at 0330 was 174/91. Notified Walden Field NP. Will continue to monitor and assess.

## 2014-10-27 NOTE — Progress Notes (Signed)
[facility] PROGRESS NOTE  [name] BFX:[id number] DOB: [birth year] DOA: 10/26/2014 PCP: No primary care provider on file.  Assessment/Plan: Principal Problem:   Tylenol overdose - Will plan on continuing acetylcysteine with discontinuation around 1700, discussed with poison control - we'll reassess acetaminophen levels around 1600 as well as liver enzymes - psychiatry consulted - Patient complaining of nausea and abdominal discomfort. Will add fentanyl for abdominal discomfort at low dose  Active Problems:   Hypokalemia - With improvement in nausea will plan on replacing orally.    Bipolar disorder/Major depression - Most likely uncontrolled and contributing to principle problem    Seizure disorder - Will continue patient's Keppra   Code Status: full Family Communication: no family at bedside Disposition Plan: monitor closely, also dependent on psychiatry recommendations   Consultants:  psychiatry  Procedures:  none  Antibiotics:  none  HPI/Subjective: Patient has no new complaints other than nausea and some abdominal discomfort. No acute issues reported overnight  Objective: Filed Vitals:   10/27/14 1231  BP: 175/92  Pulse: 72  Temp: 98.3 F (36.8 C)  Resp:     Intake/Output Summary (Last 24 hours) at 10/27/14 1241 Last data filed at 10/27/14 1207  Gross per 24 hour  Intake 713.72 ml  Output    450 ml  Net 263.72 ml   Filed Weights   10/26/14 1346 10/27/14 0600  Weight: 53.5 kg (117 lb 15.1 oz) 54.9 kg (121 lb 0.5 oz)    Exam:   General:  Patient in no acute distress, alert and awake  Cardiovascular: regular rate and rhythm, no murmurs or rubs  Respiratory: clear to auscultation bilaterally, no wheezes  Abdomen: soft, nondistended, no guarding, generalized discomfort on palpation  Musculoskeletal: no cyanosis or clubbing   Data Reviewed: Basic Metabolic Panel:  Recent Labs Lab 10/26/14 1404 10/26/14 1841 10/27/14 0425   NA 134* 138 136  K 3.3* 4.3 3.2*  CL 97 104 99  CO2 24 21 24   GLUCOSE 96 113* 97  BUN 12 11 9   CREATININE 0.79 0.53 0.53  CALCIUM 9.9 9.0 8.9   Liver Function Tests:  Recent Labs Lab 10/26/14 1404 10/26/14 1841 10/27/14 0425  AST 1284* 827* 491*  ALT 1441* 1060* 835*  ALKPHOS 185* 119* 121*  BILITOT 1.8* 2.1* 1.7*  PROT 8.0 6.2 6.3  ALBUMIN 4.8 3.6 3.5   No results for input(s): LIPASE, AMYLASE in the last 168 hours. No results for input(s): AMMONIA in the last 168 hours. CBC:  Recent Labs Lab 10/26/14 1404 10/27/14 0425  WBC 8.4 7.0  NEUTROABS 6.0  --   HGB 14.9 12.5  HCT 44.0 36.4  MCV 96.7 95.5  PLT 401* 339   Cardiac Enzymes: No results for input(s): CKTOTAL, CKMB, CKMBINDEX, TROPONINI in the last 168 hours. BNP (last 3 results) No results for input(s): BNP in the last 8760 hours.  ProBNP (last 3 results) No results for input(s): PROBNP in the last 8760 hours.  CBG: No results for input(s): GLUCAP in the last 168 hours.  Recent Results (from the past 240 hour(s))  MRSA PCR Screening     Status: None   Collection Time: 10/26/14  6:05 PM  Result Value Ref Range Status   MRSA by PCR NEGATIVE NEGATIVE Final    Comment:        The GeneXpert MRSA Assay (FDA approved for NASAL specimens only), is one component of a comprehensive MRSA colonization surveillance program. It is not intended to diagnose MRSA infection nor  to guide or monitor treatment for MRSA infections.      Studies: No results found.  Scheduled Meds: . fentaNYL  25 mcg Transdermal Q72H  . levETIRAcetam  500 mg Oral BID  . sodium chloride  3 mL Intravenous Q12H  . sodium chloride  3 mL Intravenous Q12H   Continuous Infusions: . acetylcysteine 15 mg/kg/hr (10/26/14 2000)     Time spent: > 35 minutes    Velvet Bathe  [facility] Pager [phone] If 7PM-7AM, please contact night-coverage at [email or web address], password [facility] 10/27/2014, 12:41 PM  LOS: 1 day

## 2014-10-27 NOTE — Consult Note (Signed)
[facility] Consult   Reason for Consult:  Suicide attempt by tylenol overdose Referring Physician:  Dr. Wendee Beavers Patient Identification: [name] MRN:  [id number] Principal Diagnosis: Tylenol overdose Diagnosis:   Patient Active Problem List   Diagnosis Date Noted  . Intentional acetaminophen overdose [T39.1X2A] 10/26/2014  . Major depressive disorder, recurrent, severe without psychotic features [F33.2]   . Insomnia [G47.00]   . Chronic pain syndrome [G89.4]   . Bipolar 1 disorder, mixed, severe [F31.63] 10/09/2014  . Acute blood loss anemia [D62] 10/07/2014  . Liver failure, acute [K72.00]   . GI bleed [K92.2] 10/06/2014  . Duodenal ulcer hemorrhage [K26.4] 10/06/2014  . Vomiting [R11.10] 10/05/2014  . Acute hepatitis [K72.00] 10/05/2014  . Acute renal insufficiency [N28.9] 10/05/2014  . Seizure disorder [G40.909] 10/05/2014  . Hyperammonemia [E72.20] 10/05/2014  . Suicidal ideation [R45.851] 10/05/2014  . Bipolar disorder [F31.9] 10/01/2014  . Major depression [F32.2] 10/01/2014  . Nausea and vomiting [R11.2] 09/30/2014  . Diarrhea [R19.7] 09/30/2014  . SIRS (systemic inflammatory response syndrome) [A41.9] 07/15/2014  . Tylenol overdose [T39.1X4A] 07/13/2014  . Hypokalemia [E87.6] 07/13/2014  . Depression [F32.9] 05/25/2012    Total Time spent with patient: 45 minutes  Subjective:   [name] is a 49 y.o. female patient admitted with intentional tylenol overdose. Pt was interviewed, chart reviewed. Pt reports taking 50-60 extra strength (500 mg) tylenol tablets as a suicide attempt 2 days ago. This has been her 3rd suicide attempts by tylenol overdose since Oct 2015. Since Oct, she has had a phobia of leaving her condo, and her eating disorder worsened. Pt was discharged from [facility] last week, and was supposed to be staying with a friend (so she would not be alone). However, her friend's daughter got sick, so pt ended up living alone for the past week. Pt has  also been vomiting x 1 week, so she was unable to hold down her psych meds. Pt reports a h/o IBS and anxiety. No alcohol or drug use reported. Poor sleep. Mood is still "depressed". Pt denies current SI, because she is around people. Pt denies HI/AVH.  HPI:  See above HPI Elements:   Location:  depression. Quality:  severe. Severity:  severe. Timing:  few months. Duration:  few months. Context:  being alone.  Past Medical History:  Past Medical History  Diagnosis Date  . Migraines   . Endometriosis   . ETOH abuse     sober 3 1/2 years  . Anorexia   . Depression   . Anxiety   . DJD (degenerative joint disease) of cervical spine   . Seizures   . PICC (peripherally inserted central catheter) in place     rt neck    Past Surgical History  Procedure Laterality Date  . Knee surgery    . Abdominal surgery    . Cholecystectomy    . Appendectomy    . Abdominal hysterectomy    . Nasal sinus surgery    . Laproscopy    . Carpal tunnel release      2010  . Esophagogastroduodenoscopy N/A 10/06/2014    Procedure: ESOPHAGOGASTRODUODENOSCOPY (EGD);  Surgeon: Inda Castle, MD;  Location: [place];  Service: Endoscopy;  Laterality: N/A;   Family History:  Family History  Problem Relation Age of Onset  . Hypertension Mother   . Hyperlipidemia Mother   . Heart failure Father   . Cancer Other    Social History:  History  Alcohol Use No  History  Drug Use No    History   Social History  . Marital Status: Divorced    Spouse Name: N/A    Number of Children: N/A  . Years of Education: N/A   Social History Main Topics  . Smoking status: Current Every Day Smoker -- 0.25 packs/day for 20 years    Types: Cigarettes  . Smokeless tobacco: Never Used  . Alcohol Use: No  . Drug Use: No  . Sexual Activity: None   Other Topics Concern  . None   Social History Narrative   Additional Social History:                          Allergies:   Allergies  Allergen  Reactions  . Aspirin Anaphylaxis  . Doxycycline Anaphylaxis  . Imitrex [Sumatriptan Base] Other (See Comments)    Severe hypertension  . Iodinated Diagnostic Agents Anaphylaxis  . Lidocaine Anaphylaxis    Pt states she does well with Marcaine/Bupivicaine without problem  . Prednisone Other (See Comments)    She goes crazy  . Sulfa Antibiotics Anaphylaxis  . Sulfasalazine Anaphylaxis  . Sumatriptan Other (See Comments)    Felt like she was "having a stroke", heart rate and blood pressure "went through the roof"  . Tramadol Other (See Comments)    seizures  . Levofloxacin Other (See Comments)    Joints hurt  . Latex Rash  . Levofloxacin Rash and Other (See Comments)    Joints hurt  . Metrizamide Other (See Comments)  . Nsaids Rash and Other (See Comments)    GI bleed  . Tolmetin Rash    Vitals: Blood pressure 164/84, pulse 74, temperature 98.4 F (36.9 C), temperature source Oral, resp. rate 16, height 5\' 2"  (1.575 m), weight 54.9 kg (121 lb 0.5 oz), SpO2 98 %.  Risk to Self: Is patient at risk for suicide?: Yes Risk to Others:   Prior Inpatient Therapy:   Prior Outpatient Therapy:    Current Facility-Administered Medications  Medication Dose Route Frequency Provider Last Rate Last Dose  . 0.9 %  sodium chloride infusion  250 mL Intravenous PRN Velvet Bathe, MD      . fentaNYL ([place] - dosed mcg/hr) patch 25 mcg  25 mcg Transdermal Q72H Velvet Bathe, MD   25 mcg at 10/27/14 1045  . levETIRAcetam (KEPPRA) tablet 500 mg  500 mg Oral BID Velvet Bathe, MD   500 mg at 10/27/14 1045  . ondansetron (ZOFRAN) tablet 4 mg  4 mg Oral Q6H PRN Velvet Bathe, MD   4 mg at 10/27/14 1547   Or  . ondansetron (ZOFRAN) injection 4 mg  4 mg Intravenous Q6H PRN Velvet Bathe, MD   4 mg at 10/27/14 0830  . pantoprazole (PROTONIX) EC tablet 40 mg  40 mg Oral Daily Velvet Bathe, MD   40 mg at 10/27/14 1811  . sodium chloride 0.9 % injection 3 mL  3 mL Intravenous Q12H Velvet Bathe, MD   0 mL at  10/26/14 2200  . sodium chloride 0.9 % injection 3 mL  3 mL Intravenous Q12H Velvet Bathe, MD   3 mL at 10/26/14 2200  . sodium chloride 0.9 % injection 3 mL  3 mL Intravenous PRN Velvet Bathe, MD        Musculoskeletal: Strength & Muscle Tone: decreased Gait & Station: lying in bed Patient leans: N/A  Psychiatric Specialty Exam: Physical Exam  ROS  Blood pressure 164/84, pulse 74,  temperature 98.4 F (36.9 C), temperature source Oral, resp. rate 16, height 5\' 2"  (1.575 m), weight 54.9 kg (121 lb 0.5 oz), SpO2 98 %.Body mass index is 22.13 kg/(m^2).  General Appearance: Disheveled  Eye Contact::  Minimal  Speech:  Slow  Volume:  Decreased  Mood:  Depressed  Affect:  Congruent and Depressed  Thought Process:  Goal Directed  Orientation:  Full (Time, Place, and Person)  Thought Content:  Negative  Suicidal Thoughts:  Pt denies current SI, because she is around people. However, she took a serious intentional tylenol overdose 2 days ago.  Homicidal Thoughts:  No  Memory:  Negative  Judgement:  Poor  Insight:  Shallow  Psychomotor Activity:  Decreased  Concentration:  Poor  Recall:  Poor  Fund of Knowledge:Fair  Language: Fair  Akathisia:  Negative  Handed:  Right  AIMS (if indicated):     Assets:  Housing  ADL's:  Impaired  Cognition: WNL  Sleep:      Medical Decision Making: New problem, with additional work up planned, Review of Psycho-Social Stressors (1) and Decision to obtain old records (1)  Treatment Plan Summary: Plan : Pt will need to be admitted to inpatient psychiatry, once medically cleared.   Plan:  Recommend psychiatric Inpatient admission when medically cleared. Disposition: Admit to inpatient psychiatry, once medically cleared.  [name], [name] 10/27/2014 7:33 PM

## 2014-10-27 NOTE — Progress Notes (Addendum)
Pt potassium was 3.2 at 0425. Pt needs more access due to incompatibility with acetylcysteine IV drip and due unsuccessful attempts for obtaining peripheral IV access by IV team .Also, phlebotomy was not able to obtain enough blood sample for salicylate level and Protime-INR.for 0500. Notified Walden Field NP in considering a PICC. Will continue to monitor and assess.

## 2014-10-28 DIAGNOSIS — T1491 Suicide attempt: Secondary | ICD-10-CM

## 2014-10-28 DIAGNOSIS — G4700 Insomnia, unspecified: Secondary | ICD-10-CM

## 2014-10-28 DIAGNOSIS — T391X2D Poisoning by 4-Aminophenol derivatives, intentional self-harm, subsequent encounter: Secondary | ICD-10-CM

## 2014-10-28 DIAGNOSIS — F332 Major depressive disorder, recurrent severe without psychotic features: Secondary | ICD-10-CM

## 2014-10-28 LAB — COMPREHENSIVE METABOLIC PANEL
ALBUMIN: 3.6 g/dL (ref 3.5–5.2)
ALT: 527 U/L — AB (ref 0–35)
AST: 193 U/L — AB (ref 0–37)
Alkaline Phosphatase: 102 U/L (ref 39–117)
Anion gap: 11 (ref 5–15)
BILIRUBIN TOTAL: 1.1 mg/dL (ref 0.3–1.2)
BUN: 7 mg/dL (ref 6–23)
CALCIUM: 8.8 mg/dL (ref 8.4–10.5)
CHLORIDE: 98 mmol/L (ref 96–112)
CO2: 25 mmol/L (ref 19–32)
Creatinine, Ser: 0.58 mg/dL (ref 0.50–1.10)
GLUCOSE: 75 mg/dL (ref 70–99)
POTASSIUM: 3.2 mmol/L — AB (ref 3.5–5.1)
Sodium: 134 mmol/L — ABNORMAL LOW (ref 135–145)
Total Protein: 6.1 g/dL (ref 6.0–8.3)

## 2014-10-28 LAB — PROTIME-INR
INR: 0.99 (ref 0.00–1.49)
PROTHROMBIN TIME: 13.2 s (ref 11.6–15.2)

## 2014-10-28 LAB — CLOSTRIDIUM DIFFICILE BY PCR: Toxigenic C. Difficile by PCR: NEGATIVE

## 2014-10-28 MED ORDER — ZOLPIDEM TARTRATE 5 MG PO TABS: 5.0000 mg | ORAL_TABLET | Freq: Once | ORAL | Status: DC

## 2014-10-28 MED ORDER — GABAPENTIN 100 MG PO CAPS
100.0000 mg | ORAL_CAPSULE | Freq: Three times a day (TID) | ORAL | Status: DC
  Administered 2014-10-28 – 2014-10-29 (×5): 100 mg via ORAL
  Filled 2014-10-28 (×7): qty 1

## 2014-10-28 MED ORDER — POTASSIUM CHLORIDE CRYS ER 20 MEQ PO TBCR
40.0000 meq | EXTENDED_RELEASE_TABLET | Freq: Once | ORAL | Status: AC
  Administered 2014-10-28: 40 meq via ORAL
  Filled 2014-10-28: qty 2

## 2014-10-28 MED ORDER — VITAMINS A & D EX OINT
TOPICAL_OINTMENT | CUTANEOUS | Status: AC
  Filled 2014-10-28: qty 5

## 2014-10-28 MED ORDER — BUSPIRONE HCL 15 MG PO TABS
7.5000 mg | ORAL_TABLET | Freq: Two times a day (BID) | ORAL | Status: DC
  Administered 2014-10-28 – 2014-10-29 (×3): 7.5 mg via ORAL
  Filled 2014-10-28 (×4): qty 1

## 2014-10-28 MED ORDER — CARISOPRODOL 350 MG PO TABS
350.0000 mg | ORAL_TABLET | Freq: Once | ORAL | Status: AC
  Administered 2014-10-28: 350 mg via ORAL
  Filled 2014-10-28: qty 1

## 2014-10-28 NOTE — Progress Notes (Signed)
Clinical Social Work Department CLINICAL SOCIAL WORK PSYCHIATRY SERVICE LINE ASSESSMENT 10/28/2014  Patient:  [name]  Account:  [id number]  [place] Date:  10/26/2014  Clinical Social Worker:  Sindy Messing, LCSW  Date/Time:  10/28/2014 11:00 AM Referred by:  Physician  Date referred:  10/28/2014 Reason for Referral  Psychosocial assessment   Presenting Symptoms/Problems (In the person's/family's own words):   Psych consulted due to overdose.   Abuse/Neglect/Trauma History (check all that apply)  Denies history   Abuse/Neglect/Trauma Comments:   Psychiatric History (check all that apply)  Outpatient treatment  Inpatient/hospitilization   Psychiatric medications:  Buspar 7.5 mg   Current Mental Health Hospitalizations/Previous Mental Health History:   Patient reports she has been diagnosed with depression in the past. Patient was recently at [facility] and was supposed to follow up with [facility] walk-in clinic but has not followed through with any appointments yet.   Current provider:   None currently   Place and Date:   N/A   Current Medications:   Scheduled Meds:      . busPIRone  7.5 mg Oral BID  . fentaNYL  25 mcg Transdermal Q72H  . gabapentin  100 mg Oral TID  . levETIRAcetam  500 mg Oral BID  . pantoprazole  40 mg Oral Daily  . sodium chloride  3 mL Intravenous Q12H  . sodium chloride  3 mL Intravenous Q12H        Continuous Infusions:      PRN Meds:.sodium chloride, ondansetron **OR** ondansetron (ZOFRAN) IV, sodium chloride       Previous Impatient Admission/Date/Reason:   Patient was recently DC from [facility] for suicide attempt.   Emotional Health / Current Symptoms    Suicide/Self Harm  Suicide attempt in past (date/description)   Suicide attempt in the past:   Patient admitted after overdosing on Tylenol tablets. Patient reports she has attempted at least 3 times in the past. Patient continues to feel hopeless.   Other harmful behavior:   Per chart review,  patient has problems with an eating disorder.   Psychotic/Dissociative Symptoms  None reported   Other Psychotic/Dissociative Symptoms:    Attention/Behavioral Symptoms  Within Normal Limits   Other Attention / Behavioral Symptoms:   Patient engaged during assessment.    Cognitive Impairment  Within Normal Limits   Other Cognitive Impairment:   Patient alert and oriented.    Mood and Adjustment  Flat    Stress, Anxiety, Trauma, Any Recent Loss/Stressor  Anxiety  Avoidance   Anxiety (frequency):   Patient reports she feels anxious and does not like leaving her apartment. Patient allows a neighbor and landlord to check on her but does not leave her apartment and avoids others.   Phobia (specify):   N/A   Compulsive behavior (specify):   N/A   Obsessive behavior (specify):   N/A   Other:   N/A   Substance Abuse/Use  History of substance use   SBIRT completed (please refer for detailed history):  N  Self-reported substance use:   Patient denies any current substance use. Patient reports she has been sober for over 3 years and currently has a sponsor.   Urinary Drug Screen Completed:  Y Alcohol level:   <5    Environmental/Housing/Living Arrangement  Stable housing   Who is in the home:   Alone   Emergency contact:  [place]   Patient's Strengths and Goals (patient's own words):   Patient reports she has supportive friends.  Clinical Social Worker's Interpretive Summary:   CSW received referral in order to complete psychosocial assessment. CSW reviewed chart and met with patient at bedside with psych MD.    Patient reports she was recently DC from [facility] and was supposed to have a friend stay with her. Patient reports due to anxiety and IBS problems that she often avoids people and only stays at home. Patient reported to previous psych MD that she struggled with an eating disorder as well. Patient's friend's dtr was sick and  friend was unable to stay with patient.    Patient reports a long history of medical problems and reports that she overdosed because she felt hopeless and no longer wanted to live. Patient reports she has used Tylenol in the past when she has had problems getting pain medication but that this attempt was meant to kill herself. Patient reports that landlord found her and called 911.    Patient has been to [facility] in the past and is aware of psych MD recommendations for inpatient placement at [place]. Patient engaged during assessment but had flat affect. Patient reports she wants to feel better in order to be able to avoid crisis moments in the future.   Disposition:  Recommend Psych CSW continuing to support while in hospital   [place], [place] [phone]

## 2014-10-28 NOTE — Consult Note (Signed)
Psychiatry Consult follow-up note  Reason for Consult:  Suicide attempt by tylenol overdose Referring Physician:  Dr. Wendee Beavers  Patient Identification: [name] MRN:  [id number] Principal Diagnosis: Tylenol overdose Diagnosis:   Patient Active Problem List   Diagnosis Date Noted  . Intentional acetaminophen overdose [T39.1X2A] 10/26/2014  . Major depressive disorder, recurrent, severe without psychotic features [F33.2]   . Insomnia [G47.00]   . Chronic pain syndrome [G89.4]   . Bipolar 1 disorder, mixed, severe [F31.63] 10/09/2014  . Acute blood loss anemia [D62] 10/07/2014  . Liver failure, acute [K72.00]   . GI bleed [K92.2] 10/06/2014  . Duodenal ulcer hemorrhage [K26.4] 10/06/2014  . Vomiting [R11.10] 10/05/2014  . Acute hepatitis [K72.00] 10/05/2014  . Acute renal insufficiency [N28.9] 10/05/2014  . Seizure disorder [G40.909] 10/05/2014  . Hyperammonemia [E72.20] 10/05/2014  . Suicidal ideation [R45.851] 10/05/2014  . Bipolar disorder [F31.9] 10/01/2014  . Major depression [F32.2] 10/01/2014  . Nausea and vomiting [R11.2] 09/30/2014  . Diarrhea [R19.7] 09/30/2014  . SIRS (systemic inflammatory response syndrome) [A41.9] 07/15/2014  . Tylenol overdose [T39.1X4A] 07/13/2014  . Hypokalemia [E87.6] 07/13/2014  . Depression [F32.9] 05/25/2012    Total Time spent with patient: 30 minutes  Subjective:   [name] is a 49 y.o. female patient admitted with intentional tylenol overdose. Pt was interviewed, chart reviewed. Pt reports taking 50-60 extra strength (500 mg) tylenol tablets as a suicide attempt 2 days ago. This has been her 3rd suicide attempts by tylenol overdose since Oct 2015. Since Oct, she has had a phobia of leaving her condo, and her eating disorder worsened. Pt was discharged from [facility] last week, and was supposed to be staying with a friend (so she would not be alone). However, her friend's daughter got sick, so pt ended up living alone for the past week. Pt has  also been vomiting x 1 week, so she was unable to hold down her psych meds. Pt reports a h/o IBS and anxiety. No alcohol or drug use reported. Poor sleep. Mood is still "depressed". Pt denies current SI, because she is around people. Pt denies HI/AVH.  Interim history: Patient seen, chart reviewed and case discussed with psychiatric licensed clinical social worker. Patient is known to this provider from her medical hospitalization at [facility] during the month of January 2016. Patient endorses taking intentional overdose of Tylenol with intent to end her life. Patient has history of 3 suicidal attempts in the past. Reportedly patient has been suffering with bipolar disorder, irritable bowel syndrome and a lot of anxiety. Patient reportedly sober from drug of abuse over 3 and half years. She lives by herself. Reportedly patient was discharged from the behavioral [place] recently and then see supposed to be staying with friends but could not. She felt isolated and her symptoms of anxiety got close before she overdosed on medication. Patient reported her neighbor second on her and called emergency medical services. Patient reportedly has a poor sleep and depressed and anxious mood. Patient contract for safety while in the hospital. Patient has no evidence of psychosis. Patient is willing to voluntarily signing to the inpatient psychiatric hospitalization if needed. Patient used to see Dr. Sabra Heck in the past. Patient supposed to be followed up with the [facility] but did not have an visit yet.   Past Medical History:  Past Medical History  Diagnosis Date  . Migraines   . Endometriosis   . ETOH abuse     sober 3 1/2 years  .  Anorexia   . Depression   . Anxiety   . DJD (degenerative joint disease) of cervical spine   . Seizures   . PICC (peripherally inserted central catheter) in place     rt neck    Past Surgical History  Procedure Laterality Date  . Knee surgery    . Abdominal surgery     . Cholecystectomy    . Appendectomy    . Abdominal hysterectomy    . Nasal sinus surgery    . Laproscopy    . Carpal tunnel release      2010  . Esophagogastroduodenoscopy N/A 10/06/2014    Procedure: ESOPHAGOGASTRODUODENOSCOPY (EGD);  Surgeon: Inda Castle, MD;  Location: [facility];  Service: Endoscopy;  Laterality: N/A;   Family History:  Family History  Problem Relation Age of Onset  . Hypertension Mother   . Hyperlipidemia Mother   . Heart failure Father   . Cancer Other    Social History:  History  Alcohol Use No     History  Drug Use No    History   Social History  . Marital Status: Divorced    Spouse Name: N/A    Number of Children: N/A  . Years of Education: N/A   Social History Main Topics  . Smoking status: Current Every Day Smoker -- 0.25 packs/day for 20 years    Types: Cigarettes  . Smokeless tobacco: Never Used  . Alcohol Use: No  . Drug Use: No  . Sexual Activity: None   Other Topics Concern  . None   Social History Narrative   Additional Social History:      Allergies:   Allergies  Allergen Reactions  . Aspirin Anaphylaxis  . Doxycycline Anaphylaxis  . Imitrex [Sumatriptan Base] Other (See Comments)    Severe hypertension  . Iodinated Diagnostic Agents Anaphylaxis  . Lidocaine Anaphylaxis    Pt states she does well with Marcaine/Bupivicaine without problem  . Prednisone Other (See Comments)    She goes crazy  . Sulfa Antibiotics Anaphylaxis  . Sulfasalazine Anaphylaxis  . Sumatriptan Other (See Comments)    Felt like she was "having a stroke", heart rate and blood pressure "went through the roof"  . Tramadol Other (See Comments)    seizures  . Levofloxacin Other (See Comments)    Joints hurt  . Latex Rash  . Levofloxacin Rash and Other (See Comments)    Joints hurt  . Metrizamide Other (See Comments)  . Nsaids Rash and Other (See Comments)    GI bleed  . Tolmetin Rash    Vitals: Blood pressure 120/54, pulse 84,  temperature 97.9 F (36.6 C), temperature source Oral, resp. rate 15, height 5\' 2"  (1.575 m), weight 54.9 kg (121 lb 0.5 oz), SpO2 94 %.  Risk to Self: Is patient at risk for suicide?: Yes Risk to Others:   Prior Inpatient Therapy:   Prior Outpatient Therapy:    Current Facility-Administered Medications  Medication Dose Route Frequency Provider Last Rate Last Dose  . 0.9 %  sodium chloride infusion  250 mL Intravenous PRN Velvet Bathe, MD      . fentaNYL ([place] - dosed mcg/hr) patch 25 mcg  25 mcg Transdermal Q72H Velvet Bathe, MD   25 mcg at 10/27/14 1045  . levETIRAcetam (KEPPRA) tablet 500 mg  500 mg Oral BID Velvet Bathe, MD   500 mg at 10/28/14 1117  . ondansetron (ZOFRAN) tablet 4 mg  4 mg Oral Q6H PRN Velvet Bathe, MD  4 mg at 10/27/14 1547   Or  . ondansetron (ZOFRAN) injection 4 mg  4 mg Intravenous Q6H PRN Velvet Bathe, MD   4 mg at 10/28/14 0809  . pantoprazole (PROTONIX) EC tablet 40 mg  40 mg Oral Daily Velvet Bathe, MD   40 mg at 10/28/14 1116  . sodium chloride 0.9 % injection 3 mL  3 mL Intravenous Q12H Velvet Bathe, MD   3 mL at 10/28/14 1121  . sodium chloride 0.9 % injection 3 mL  3 mL Intravenous Q12H Velvet Bathe, MD   3 mL at 10/28/14 1121  . sodium chloride 0.9 % injection 3 mL  3 mL Intravenous PRN Velvet Bathe, MD        Musculoskeletal: Strength & Muscle Tone: decreased Gait & Station: lying in bed Patient leans: N/A  Psychiatric Specialty Exam: Physical Exam  ROS  Blood pressure 120/54, pulse 84, temperature 97.9 F (36.6 C), temperature source Oral, resp. rate 15, height 5\' 2"  (1.575 m), weight 54.9 kg (121 lb 0.5 oz), SpO2 94 %.Body mass index is 22.13 kg/(m^2).  General Appearance: Disheveled  Eye Contact::  Minimal  Speech:  Slow  Volume:  Decreased  Mood:  Depressed  Affect:  Congruent and Depressed  Thought Process:  Goal Directed  Orientation:  Full (Time, Place, and Person)  Thought Content:  Negative  Suicidal Thoughts:  Pt denies  current SI, because she is around people. However, she took a serious intentional tylenol overdose 2 days ago.  Homicidal Thoughts:  No  Memory:  Negative  Judgement:  Poor  Insight:  Shallow  Psychomotor Activity:  Decreased  Concentration:  Poor  Recall:  Poor  Fund of Knowledge:Fair  Language: Fair  Akathisia:  Negative  Handed:  Right  AIMS (if indicated):     Assets:  Housing  ADL's:  Impaired  Cognition: WNL  Sleep:      Medical Decision Making: New problem, with additional work up planned, Review of Psycho-Social Stressors (1) and Decision to obtain old records (1)  Treatment Plan Summary: Daily contact with patient to assess and evaluate symptoms and progress in treatment and Medication management  Plan:  Recommend psychiatric Inpatient admission when medically cleared. Supportive therapy provided about ongoing stressors.  Will start BuSpar 7.5 mg twice daily for anxiety and Neurontin 100 mg 3 times a day for chronic pain secondary to IBS  Disposition: Psychiatric social service will facilitate acute inpatient psychiatric hospitalization, once medically cleared.  [name],JANARDHAHA R. 10/28/2014 11:32 AM
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
## 2014-10-28 NOTE — Progress Notes (Addendum)
Pt c/o itching. Rash noted from back to front on the left and right side. MD notified. He stated to monitor & he will assess tomorrow.

## 2014-10-28 NOTE — Progress Notes (Signed)
[facility] PROGRESS NOTE  [name] NKN:[id number] DOB: 1965-12-22 DOA: 10/26/2014 PCP: No primary care provider on file.  Assessment/Plan: Principal Problem:   Tylenol overdose - Will plan on continuing acetylcysteine with discontinuation around 1700, discussed with poison control - we'll reassess acetaminophen levels around 1600 as well as liver enzymes - psychiatry consulted - Patient complaining of nausea and abdominal discomfort. Still complaining of nausea. With improved oral intake will plan on transitioning to psychiatric hospital most likely 10/29/14  Active Problems:   Hypokalemia - With improvement in oral intake should improve - Will add K dur    Bipolar disorder/Major depression - Most likely uncontrolled and contributing to principle problem    Seizure disorder - Will continue patient's Keppra   Code Status: full Family Communication: no family at bedside Disposition Plan: with improvement in oral intake will plan on transitioning to behavioral health or psychiatric hospital.   Consultants:  psychiatry  Procedures:  none  Antibiotics:  none  HPI/Subjective: Complaining of poor oral intake.  Objective: Filed Vitals:   10/28/14 0800  BP:   Pulse:   Temp: 97.9 F (36.6 C)  Resp:     Intake/Output Summary (Last 24 hours) at 10/28/14 1003 Last data filed at 10/28/14 0400  Gross per 24 hour  Intake 1055.72 ml  Output    456 ml  Net 599.72 ml   Filed Weights   10/26/14 1346 10/27/14 0600  Weight: 53.5 kg (117 lb 15.1 oz) 54.9 kg (121 lb 0.5 oz)    Exam:   General:  Patient in no acute distress, alert and awake  Cardiovascular: regular rate and rhythm, no murmurs or rubs  Respiratory: clear to auscultation bilaterally, no wheezes  Abdomen: soft, nondistended, no guarding, generalized discomfort on palpation  Musculoskeletal: no cyanosis or clubbing   Data Reviewed: Basic Metabolic Panel:  Recent Labs Lab 10/26/14 1841  10/27/14 0425 10/27/14 1153 10/27/14 1600 10/28/14 0416  NA 138 136 138 136 134*  K 4.3 3.2* 3.0* 3.4* 3.2*  CL 104 99 99 100 98  CO2 21 24 25 24 25   GLUCOSE 113* 97 100* 87 75  BUN 11 9 8 6 7   CREATININE 0.53 0.53 0.59 0.54 0.58  CALCIUM 9.0 8.9 9.1 9.1 8.8   Liver Function Tests:  Recent Labs Lab 10/26/14 1841 10/27/14 0425 10/27/14 1153 10/27/14 1600 10/28/14 0416  AST 827* 491* 371* 311* 193*  ALT 1060* 835* 741* 680* 527*  ALKPHOS 119* 121* 117 114 102  BILITOT 2.1* 1.7* 1.6* 1.4* 1.1  PROT 6.2 6.3 6.6 6.3 6.1  ALBUMIN 3.6 3.5 3.8 3.5 3.6   No results for input(s): LIPASE, AMYLASE in the last 168 hours. No results for input(s): AMMONIA in the last 168 hours. CBC:  Recent Labs Lab 10/26/14 1404 10/27/14 0425  WBC 8.4 7.0  NEUTROABS 6.0  --   HGB 14.9 12.5  HCT 44.0 36.4  MCV 96.7 95.5  PLT 401* 339   Cardiac Enzymes: No results for input(s): CKTOTAL, CKMB, CKMBINDEX, TROPONINI in the last 168 hours. BNP (last 3 results) No results for input(s): BNP in the last 8760 hours.  ProBNP (last 3 results) No results for input(s): PROBNP in the last 8760 hours.  CBG: No results for input(s): GLUCAP in the last 168 hours.  Recent Results (from the past 240 hour(s))  MRSA PCR Screening     Status: None   Collection Time: 10/26/14  6:05 PM  Result Value Ref Range Status   MRSA by  PCR NEGATIVE NEGATIVE Final    Comment:        The GeneXpert MRSA Assay (FDA approved for NASAL specimens only), is one component of a comprehensive MRSA colonization surveillance program. It is not intended to diagnose MRSA infection nor to guide or monitor treatment for MRSA infections.   Clostridium Difficile by PCR     Status: None   Collection Time: 10/28/14  1:32 AM  Result Value Ref Range Status   C difficile by pcr NEGATIVE NEGATIVE Final    Comment: Performed at [facility]     Studies: No results found.  Scheduled Meds: . fentaNYL  25 mcg Transdermal  Q72H  . levETIRAcetam  500 mg Oral BID  . pantoprazole  40 mg Oral Daily  . potassium chloride  40 mEq Oral Once  . sodium chloride  3 mL Intravenous Q12H  . sodium chloride  3 mL Intravenous Q12H   Continuous Infusions:     Time spent: > 35 minutes    Velvet Bathe  [facility] Pager [phone] If 7PM-7AM, please contact night-coverage at [email or web address], password [facility] 10/28/2014, 10:03 AM  LOS: 2 days

## 2014-10-29 ENCOUNTER — Inpatient Hospital Stay: Payer: Self-pay | Admitting: Psychiatry

## 2014-10-29 LAB — PROTIME-INR
INR: 0.96 (ref 0.00–1.49)
Prothrombin Time: 12.9 seconds (ref 11.6–15.2)

## 2014-10-29 MED ORDER — POTASSIUM CHLORIDE CRYS ER 20 MEQ PO TBCR
40.0000 meq | EXTENDED_RELEASE_TABLET | Freq: Once | ORAL | Status: AC
  Administered 2014-10-29: 40 meq via ORAL
  Filled 2014-10-29: qty 2

## 2014-10-29 MED ORDER — CARISOPRODOL 350 MG PO TABS
350.0000 mg | ORAL_TABLET | Freq: Once | ORAL | Status: AC
  Administered 2014-10-29: 350 mg via ORAL
  Filled 2014-10-29: qty 1

## 2014-10-29 NOTE — Discharge Summary (Signed)
Physician Discharge Summary  [name] FKC:[id number] DOB: May 16, 1966 DOA: 10/26/2014  PCP: No primary care provider on file.  Admit date: 10/26/2014 Discharge date: 10/29/2014  Time spent: > 35 minutes  Recommendations :  1. Patient will need to have her psychiatric medications restarted when okay to restart them from psychiatric point of view. 2. Would reassess liver enzymes within the next 2 or 3 days. 3. Reassess potassium levels and replace as needed  Discharge Diagnoses:  Principal Problem:   Tylenol overdose Active Problems:   Hypokalemia   Bipolar disorder   Major depression   Seizure disorder   Intentional acetaminophen overdose   Discharge Condition: stable  Diet recommendation: regular diet  Filed Weights   10/26/14 1346 10/27/14 0600  Weight: 53.5 kg (117 lb 15.1 oz) 54.9 kg (121 lb 0.5 oz)    History of present illness:  49 year old with history of bipolar disorder and depression as well as seizure disorder. Presented to the hospital after acetaminophen overdose.  Hospital Course:  Intentional acetaminophen overdose - Was initially treated with acetylcysteine - Continue to show improvement with liver enzymes trending down - Abdominal discomfort resolved and patient tolerating oral intake. Medically cleared will transition to psychiatric inpatient care - Social worker on board and assisting with transitioning to inpatient psychiatric services  Bipolar disorder/depression - Defer recommendations to psychiatrist  Procedures:  None  Consultations:  Psychiatry  Discharge Exam: Filed Vitals:   10/29/14 1332  BP:   Pulse:   Temp: 97.4 F (36.3 C)  Resp:     General: Patient in no acute distress, alert and awake Cardiovascular: RRR, no mrg Respiratory: cta bl, no wheezes  Discharge Instructions   Discharge Instructions    Call MD for:  difficulty breathing, headache or visual disturbances    Complete by:  As directed      Call MD for:   severe uncontrolled pain    Complete by:  As directed      Call MD for:  temperature >100.4    Complete by:  As directed      Diet - low sodium heart healthy    Complete by:  As directed      Increase activity slowly    Complete by:  As directed           Current Discharge Medication List    CONTINUE these medications which have NOT CHANGED   Details  busPIRone (BUSPAR) 7.5 MG tablet Take 1 tablet (7.5 mg total) by mouth 2 (two) times daily. Qty: 60 tablet, Refills: 0    carisoprodol (SOMA) 350 MG tablet Take 1 tablet (350 mg total) by mouth at bedtime. Qty: 30 tablet, Refills: 0    clonazePAM (KLONOPIN) 1 MG tablet Take 1 tablet (1 mg total) by mouth 3 (three) times daily as needed (anxiety). Qty: 12 tablet, Refills: 0    FLUoxetine (PROZAC) 20 MG capsule Take 1 capsule (20 mg total) by mouth daily. Qty: 30 capsule, Refills: 0    gabapentin (NEURONTIN) 100 MG capsule Take 1 capsule (100 mg total) by mouth 3 (three) times daily. Qty: 90 capsule, Refills: 0    levETIRAcetam (KEPPRA) 500 MG tablet Take 1 tablet (500 mg total) by mouth 2 (two) times daily. Qty: 60 tablet, Refills: 0    pantoprazole (PROTONIX) 40 MG tablet Take 1 tablet (40 mg total) by mouth 2 (two) times daily. Qty: 60 tablet, Refills: 0      STOP taking these medications     amitriptyline (ELAVIL)  50 MG tablet      OXcarbazepine (TRILEPTAL) 150 MG tablet      oxyCODONE-acetaminophen (PERCOCET/ROXICET) 5-325 MG per tablet        Allergies  Allergen Reactions  . Aspirin Anaphylaxis  . Doxycycline Anaphylaxis  . Imitrex [Sumatriptan Base] Other (See Comments)    Severe hypertension  . Iodinated Diagnostic Agents Anaphylaxis  . Lidocaine Anaphylaxis    Pt states she does well with Marcaine/Bupivicaine without problem  . Prednisone Other (See Comments)    She goes crazy  . Sulfa Antibiotics Anaphylaxis  . Sulfasalazine Anaphylaxis  . Sumatriptan Other (See Comments)    Felt like she was "having  a stroke", heart rate and blood pressure "went through the roof"  . Tramadol Other (See Comments)    seizures  . Levofloxacin Other (See Comments)    Joints hurt  . Latex Rash  . Levofloxacin Rash and Other (See Comments)    Joints hurt  . Metrizamide Other (See Comments)  . Nsaids Rash and Other (See Comments)    GI bleed  . Tolmetin Rash      The results of significant diagnostics from this hospitalization (including imaging, microbiology, ancillary and laboratory) are listed below for reference.    Significant Diagnostic Studies: Ct Abdomen Pelvis Wo Contrast  10/05/2014   CLINICAL DATA:  Acute onset of hematemesis and melena over the past several days. Surgical history includes cholecystectomy, appendectomy and hysterectomy. Current history of depression and alcohol use.  EXAM: CT ABDOMEN AND PELVIS WITHOUT CONTRAST  TECHNIQUE: Multidetector CT imaging of the abdomen and pelvis was performed following the standard protocol without IV contrast.  COMPARISON:  09/30/2014, 01/06/2010, 03/12/2004.  FINDINGS: Severe diffuse hepatic steatosis without focal parenchymal abnormality, allowing for the unenhanced technique. Normal unenhanced appearance of the spleen, pancreas, right adrenal gland, and both kidneys. Approximate 1.6 x 1.0 cm low-attenuation nodule involving the left adrenal gland, unchanged. Gallbladder surgically absent. No unexpected biliary ductal dilation. Mild iliac atherosclerosis. No significant lymphadenopathy.  Normal appearing stomach and small bowel. Entire colon relatively decompressed with the exception of mild gaseous distention of the sigmoid colon, though no obstructing colonic mass is identified. No ascites. No visible gastric or esophageal varices.  Urinary bladder unremarkable. Uterus surgically absent. No adnexal masses or free pelvic fluid.  Bone window images demonstrate mild degenerative changes at T9-10 with Schmorl's nodes in multiple thoracic vertebrae. Visualized  lung bases clear. Heart size normal.  IMPRESSION: 1. No acute abnormality involving the abdomen or pelvis. 2. Severe diffuse hepatic steatosis. 3. Stable left adrenal adenoma.   Electronically Signed   By: Evangeline Dakin M.D.   On: 10/05/2014 15:12   Ct Abdomen Pelvis Wo Contrast  09/30/2014   CLINICAL DATA:  Abdominal pain.  Nausea, vomiting, diarrhea.  EXAM: CT ABDOMEN AND PELVIS WITHOUT CONTRAST  TECHNIQUE: Multidetector CT imaging of the abdomen and pelvis was performed following the standard protocol without IV contrast.  COMPARISON:  01/06/2010  FINDINGS: The included lung bases are clear.  Clips in the gallbladder fossa from cholecystectomy. Mild prominence of the biliary tree, a common finding postcholecystectomy. This is unchanged from prior exam. There is focal fatty infiltration adjacent to the falciform ligament, the unenhanced liver is otherwise unremarkable. The unenhanced spleen, pancreas, and adrenal glands are normal. The kidneys are symmetric in size without stones or hydronephrosis. There is no perinephric stranding. Both ureters are decompressed without stones along the course.  Ingested oral contrast remains in the stomach. There is questionable gastric wall thickening.  Small and large bowel loops are mildly fluid filled but without bowel wall thickening or dilatation. No perienteric inflammatory change. The appendix is normal. No free air, free fluid, or intra-abdominal fluid collection. Abdominal aorta is normal in caliber with mild atherosclerosis. No retroperitoneal adenopathy.  Within the pelvis, the urinary bladder is minimally distended. The uterus is not seen, presumably surgically absent. There is no adnexal mass. No pelvic free fluid.  No acute or suspicious osseous abnormality. There is a bone island in the left proximal femur.  IMPRESSION: 1. Questionable gastric wall thickening. Small and large bowel loops are fluid-filled but otherwise normal. Findings may reflect mild  gastroenteritis. 2. Postcholecystectomy with unchanged prominence of the common bile duct.   Electronically Signed   By: Jeb Levering M.D.   On: 09/30/2014 22:24   Dg Shoulder Right  10/05/2014   CLINICAL DATA:  Status post fall down steps, with persistent right shoulder pain. Initial encounter.  EXAM: RIGHT SHOULDER - 2+ VIEW  COMPARISON:  Right shoulder radiographs performed 01/06/2010  FINDINGS: There is no evidence of fracture or dislocation. The right humeral head is seated within the glenoid fossa. Minimal degenerative change is noted at the right acromioclavicular joint. No significant soft tissue abnormalities are seen. The visualized portions of the right lung are clear.  IMPRESSION: No evidence of fracture or dislocation.   Electronically Signed   By: Garald Balding M.D.   On: 10/05/2014 21:54   Dg Chest Port 1 View  10/05/2014   CLINICAL DATA:  SOB, emesis, diarrhea x 4 days. Chest pain radiates down left arm x 3 days with headache and abdominal pain  EXAM: PORTABLE CHEST - 1 VIEW  COMPARISON:  09/30/2014.  FINDINGS: Normal sized heart. Clear lungs with normal vascularity. Mild scoliosis.  IMPRESSION: No acute abnormality.   Electronically Signed   By: Enrique Sack M.D.   On: 10/05/2014 07:55   Dg Chest Portable 1 View  09/30/2014   CLINICAL DATA:  Central line placement.  Initial encounter.  EXAM: PORTABLE CHEST - 1 VIEW  COMPARISON:  Chest radiograph performed 07/13/2014  FINDINGS: The patient's right IJ line is seen extending into the right axillary vein. On discussion with the clinical team, repositioning may be too difficult at this time, given the patient's difficulties with vascular access.  The lungs are well-aerated and clear. There is no evidence of focal opacification, pleural effusion or pneumothorax.  The cardiomediastinal silhouette is within normal limits. No acute osseous abnormalities are seen.  IMPRESSION: 1. Left IJ line noted extending into the right axillary vein. On  discussion with the clinical team, repositioning may be too difficult at this time, given the patient's difficulties with vascular access. 2. No acute cardiopulmonary process seen.  These results were called by telephone at the time of interpretation on 09/30/2014 at 11:10 pm to Dr. Shirlyn Goltz, who verbally acknowledged these results.   Electronically Signed   By: Garald Balding M.D.   On: 09/30/2014 23:10   Dg Shoulder Left  10/05/2014   CLINICAL DATA:  Status post fall, with persistent left shoulder pain. Initial encounter.  EXAM: LEFT SHOULDER - 2+ VIEW  COMPARISON:  None.  FINDINGS: There is no evidence of fracture or dislocation. The left humeral head is seated within the glenoid fossa. The acromioclavicular joint is unremarkable in appearance. No significant soft tissue abnormalities are seen. The visualized portions of the left lung are clear.  IMPRESSION: No evidence of fracture or dislocation.   Electronically Signed  By: Garald Balding M.D.   On: 10/05/2014 21:54    Microbiology: Recent Results (from the past 240 hour(s))  MRSA PCR Screening     Status: None   Collection Time: 10/26/14  6:05 PM  Result Value Ref Range Status   MRSA by PCR NEGATIVE NEGATIVE Final    Comment:        The GeneXpert MRSA Assay (FDA approved for NASAL specimens only), is one component of a comprehensive MRSA colonization surveillance program. It is not intended to diagnose MRSA infection nor to guide or monitor treatment for MRSA infections.   Clostridium Difficile by PCR     Status: None   Collection Time: 10/28/14  1:32 AM  Result Value Ref Range Status   C difficile by pcr NEGATIVE NEGATIVE Final    Comment: Performed at [facility]: Basic Metabolic Panel:  Recent Labs Lab 10/26/14 1841 10/27/14 0425 10/27/14 1153 10/27/14 1600 10/28/14 0416  NA 138 136 138 136 134*  K 4.3 3.2* 3.0* 3.4* 3.2*  CL 104 99 99 100 98  CO2 21 24 25 24 25   GLUCOSE 113* 97 100* 87 75  BUN  11 9 8 6 7   CREATININE 0.53 0.53 0.59 0.54 0.58  CALCIUM 9.0 8.9 9.1 9.1 8.8   Liver Function Tests:  Recent Labs Lab 10/26/14 1841 10/27/14 0425 10/27/14 1153 10/27/14 1600 10/28/14 0416  AST 827* 491* 371* 311* 193*  ALT 1060* 835* 741* 680* 527*  ALKPHOS 119* 121* 117 114 102  BILITOT 2.1* 1.7* 1.6* 1.4* 1.1  PROT 6.2 6.3 6.6 6.3 6.1  ALBUMIN 3.6 3.5 3.8 3.5 3.6   No results for input(s): LIPASE, AMYLASE in the last 168 hours. No results for input(s): AMMONIA in the last 168 hours. CBC:  Recent Labs Lab 10/26/14 1404 10/27/14 0425  WBC 8.4 7.0  NEUTROABS 6.0  --   HGB 14.9 12.5  HCT 44.0 36.4  MCV 96.7 95.5  PLT 401* 339   Cardiac Enzymes: No results for input(s): CKTOTAL, CKMB, CKMBINDEX, TROPONINI in the last 168 hours. BNP: BNP (last 3 results) No results for input(s): BNP in the last 8760 hours.  ProBNP (last 3 results) No results for input(s): PROBNP in the last 8760 hours.  CBG: No results for input(s): GLUCAP in the last 168 hours.     Signed:  Velvet Bathe  [facility] 10/29/2014, 1:35 PM

## 2014-10-29 NOTE — Progress Notes (Signed)
Clinical Social Work  CSW spoke with attending MD who reports that patient is medically stable to DC. CSW contacted the following facilities to check bed availability:  [facility]- available beds. Referral faxed.  [facility]- [facility]) reports no current beds but will place patient on waiting list  [facility]- available beds. Referral faxed.  [facility]- no available beds  [facility]- no available beds  [facility]- available beds. Referral faxed.  [facility]- available beds. Referral faxed.  [facility]- available beds. Referral faxed.  [facility]- available beds. Referral faxed.  [facility]- unsure of bed status but referral faxed.  [facility]- no available beds  [facility]- no available beds  [facility]- no [place] beds available  [facility]- available beds. Referral faxed.  CSW will continue to follow to assist with placement. Patient currently under IVC which is valid until 11/02/14.  [place], [place] [phone]

## 2014-10-29 NOTE — Progress Notes (Signed)
Clinical Social Work  Patient accepted to [facility] to room 305-B by Dr. Lenord Carbo. RN to call report to [phone]. CSW met with patient and explained DC plans. Patient reports that she was promised to go to [facility] and is unhappy that she has to go to [place] that patient could not go to [facility] but explained that due to IVC status, patient would have to go to [place]. Patient reports she understands situation and aware of plans. CSW faxed IVC paperwork and DC summary to [facility] and arranged transportation via [place]. Sheriff reports it will take a few hours to transport but will come to pick up patient as soon as possible. Bedside RN aware of plans.  CSW is signing off but available if needed.  [name], [place] [phone]

## 2014-10-29 NOTE — Consult Note (Signed)
Psychiatry Consult follow-up note  Reason for Consult:  Suicide attempt by tylenol overdose Referring Physician:  Dr. Wendee Beavers  Patient Identification: [name] MRN:  [id number] Principal Diagnosis: Tylenol overdose Diagnosis:   Patient Active Problem List   Diagnosis Date Noted  . Intentional acetaminophen overdose [T39.1X2A] 10/26/2014  . Major depressive disorder, recurrent, severe without psychotic features [F33.2]   . Insomnia [G47.00]   . Chronic pain syndrome [G89.4]   . Bipolar 1 disorder, mixed, severe [F31.63] 10/09/2014  . Acute blood loss anemia [D62] 10/07/2014  . Liver failure, acute [K72.00]   . GI bleed [K92.2] 10/06/2014  . Duodenal ulcer hemorrhage [K26.4] 10/06/2014  . Vomiting [R11.10] 10/05/2014  . Acute hepatitis [K72.00] 10/05/2014  . Acute renal insufficiency [N28.9] 10/05/2014  . Seizure disorder [G40.909] 10/05/2014  . Hyperammonemia [E72.20] 10/05/2014  . Suicidal ideation [R45.851] 10/05/2014  . Bipolar disorder [F31.9] 10/01/2014  . Major depression [F32.2] 10/01/2014  . Nausea and vomiting [R11.2] 09/30/2014  . Diarrhea [R19.7] 09/30/2014  . SIRS (systemic inflammatory response syndrome) [A41.9] 07/15/2014  . Tylenol overdose [T39.1X4A] 07/13/2014  . Hypokalemia [E87.6] 07/13/2014  . Depression [F32.9] 05/25/2012    Total Time spent with patient: 30 minutes  Subjective:   [name] is a 49 y.o. female patient admitted with intentional tylenol overdose. Pt was interviewed, chart reviewed. Pt reports taking 50-60 extra strength (500 mg) tylenol tablets as a suicide attempt 2 days ago. This has been her 3rd suicide attempts by tylenol overdose since Oct 2015. Since Oct, she has had a phobia of leaving her condo, and her eating disorder worsened. Pt was discharged from [facility] last week, and was supposed to be staying with a friend (so she would not be alone). However, her friend's daughter got sick, so pt ended up living alone for the past week. Pt has  also been vomiting x 1 week, so she was unable to hold down her psych meds. Pt reports a h/o IBS and anxiety. No alcohol or drug use reported. Poor sleep. Mood is still "depressed". Pt denies current SI, because she is around people. Pt denies HI/AVH.  Interim history: Patient complained of feeling depressed, sad and tearful and still wishes to be dead. She states that she has no other option except die at this time. She also wishes to go to [facility] so that she can see Dr. Sabra Heck and unable to understand that she has been on IVC and we have to take the first option of open psych bed and no open beds at [facility]. She feels staff is rude when provided explanation.   Patient has history of 3 suicidal attempts in the past. Reportedly patient has been suffering with bipolar disorder, irritable bowel syndrome and a lot of anxiety. Patient reportedly sober from drug of abuse over 3 and half years. She lives by herself. Reportedly patient was discharged from the behavioral [place] recently and then see supposed to be staying with friends but could not. She felt isolated and her symptoms of anxiety got worse before she overdosed on medication. Patient reported her neighbor second on her and called emergency medical services. Patient reportedly has a poor sleep and depressed and anxious mood. Patient contract for safety while in the hospital. Patient has no evidence of psychosis.   Past Medical History:  Past Medical History  Diagnosis Date  . Migraines   . Endometriosis   . ETOH abuse     sober 3 1/2 years  . Anorexia   . Depression   .  Anxiety   . DJD (degenerative joint disease) of cervical spine   . Seizures   . PICC (peripherally inserted central catheter) in place     rt neck    Past Surgical History  Procedure Laterality Date  . Knee surgery    . Abdominal surgery    . Cholecystectomy    . Appendectomy    . Abdominal hysterectomy    . Nasal sinus surgery    . Laproscopy    . Carpal tunnel  release      2010  . Esophagogastroduodenoscopy N/A 10/06/2014    Procedure: ESOPHAGOGASTRODUODENOSCOPY (EGD);  Surgeon: Inda Castle, MD;  Location: [place];  Service: Endoscopy;  Laterality: N/A;   Family History:  Family History  Problem Relation Age of Onset  . Hypertension Mother   . Hyperlipidemia Mother   . Heart failure Father   . Cancer Other    Social History:  History  Alcohol Use No     History  Drug Use No    History   Social History  . Marital Status: Divorced    Spouse Name: N/A    Number of Children: N/A  . Years of Education: N/A   Social History Main Topics  . Smoking status: Current Every Day Smoker -- 0.25 packs/day for 20 years    Types: Cigarettes  . Smokeless tobacco: Never Used  . Alcohol Use: No  . Drug Use: No  . Sexual Activity: None   Other Topics Concern  . None   Social History Narrative   Additional Social History:      Allergies:   Allergies  Allergen Reactions  . Aspirin Anaphylaxis  . Doxycycline Anaphylaxis  . Imitrex [Sumatriptan Base] Other (See Comments)    Severe hypertension  . Iodinated Diagnostic Agents Anaphylaxis  . Lidocaine Anaphylaxis    Pt states she does well with Marcaine/Bupivicaine without problem  . Prednisone Other (See Comments)    She goes crazy  . Sulfa Antibiotics Anaphylaxis  . Sulfasalazine Anaphylaxis  . Sumatriptan Other (See Comments)    Felt like she was "having a stroke", heart rate and blood pressure "went through the roof"  . Tramadol Other (See Comments)    seizures  . Levofloxacin Other (See Comments)    Joints hurt  . Latex Rash  . Levofloxacin Rash and Other (See Comments)    Joints hurt  . Metrizamide Other (See Comments)  . Nsaids Rash and Other (See Comments)    GI bleed  . Tolmetin Rash    Vitals: Blood pressure 110/70, pulse 91, temperature 97.4 F (36.3 C), temperature source Oral, resp. rate 16, height 5\' 2"  (1.575 m), weight 54.9 kg (121 lb 0.5 oz), SpO2 100  %.  Risk to Self: Is patient at risk for suicide?: Yes Risk to Others:   Prior Inpatient Therapy:   Prior Outpatient Therapy:    Current Facility-Administered Medications  Medication Dose Route Frequency Provider Last Rate Last Dose  . 0.9 %  sodium chloride infusion  250 mL Intravenous PRN Velvet Bathe, MD      . busPIRone (BUSPAR) tablet 7.5 mg  7.5 mg Oral BID Durward Parcel, MD   7.5 mg at 10/29/14 1008  . fentaNYL (DURAGESIC - dosed mcg/hr) patch 25 mcg  25 mcg Transdermal Q72H Velvet Bathe, MD   25 mcg at 10/27/14 1045  . gabapentin (NEURONTIN) capsule 100 mg  100 mg Oral TID Durward Parcel, MD   100 mg at 10/29/14 1559  .  levETIRAcetam (KEPPRA) tablet 500 mg  500 mg Oral BID Velvet Bathe, MD   500 mg at 10/29/14 1008  . ondansetron (ZOFRAN) tablet 4 mg  4 mg Oral Q6H PRN Velvet Bathe, MD   4 mg at 10/27/14 1547   Or  . ondansetron (ZOFRAN) injection 4 mg  4 mg Intravenous Q6H PRN Velvet Bathe, MD   4 mg at 10/29/14 0744  . pantoprazole (PROTONIX) EC tablet 40 mg  40 mg Oral Daily Velvet Bathe, MD   40 mg at 10/29/14 1008  . sodium chloride 0.9 % injection 3 mL  3 mL Intravenous Q12H Velvet Bathe, MD   3 mL at 10/28/14 1121  . sodium chloride 0.9 % injection 3 mL  3 mL Intravenous Q12H Velvet Bathe, MD   3 mL at 10/28/14 2206  . sodium chloride 0.9 % injection 3 mL  3 mL Intravenous PRN Velvet Bathe, MD   3 mL at 10/29/14 0748    Musculoskeletal: Strength & Muscle Tone: decreased Gait & Station: lying in bed Patient leans: N/A  Psychiatric Specialty Exam: Physical Exam  ROS  Blood pressure 110/70, pulse 91, temperature 97.4 F (36.3 C), temperature source Oral, resp. rate 16, height 5\' 2"  (1.575 m), weight 54.9 kg (121 lb 0.5 oz), SpO2 100 %.Body mass index is 22.13 kg/(m^2).  General Appearance: Disheveled  Eye Contact::  Minimal  Speech:  Slow  Volume:  Decreased  Mood:  Depressed  Affect:  Congruent and Depressed  Thought Process:  Goal Directed   Orientation:  Full (Time, Place, and Person)  Thought Content:  Negative  Suicidal Thoughts:  Pt denies current SI, because she is around people. However, she took a serious intentional tylenol overdose 2 days ago.  Homicidal Thoughts:  No  Memory:  Negative  Judgement:  Poor  Insight:  Shallow  Psychomotor Activity:  Decreased  Concentration:  Poor  Recall:  Poor  Fund of Knowledge:Fair  Language: Fair  Akathisia:  Negative  Handed:  Right  AIMS (if indicated):     Assets:  Housing  ADL's:  Impaired  Cognition: WNL  Sleep:      Medical Decision Making: New problem, with additional work up planned, Review of Psycho-Social Stressors (1) and Decision to obtain old records (1)  Treatment Plan Summary: Daily contact with patient to assess and evaluate symptoms and progress in treatment and Medication management  Plan:  Recommend psychiatric Inpatient admission when medically cleared. Supportive therapy provided about ongoing stressors.  continue BuSpar 7.5 mg twice daily for anxiety and Neurontin 100 mg 3 times a day for chronic pain secondary to IBS  Disposition: Psychiatric social service will facilitate acute inpatient psychiatric hospitalization, once medically cleared.  Jeffrie Stander,JANARDHAHA R. 10/29/2014 4:08 PM

## 2014-10-31 ENCOUNTER — Emergency Department: Payer: Self-pay | Admitting: Emergency Medicine

## 2014-11-25 ENCOUNTER — Inpatient Hospital Stay (HOSPITAL_COMMUNITY)
Admission: EM | Admit: 2014-11-25 | Discharge: 2014-11-28 | DRG: 918 | Payer: Medicaid Other | Attending: Internal Medicine | Admitting: Internal Medicine

## 2014-11-25 ENCOUNTER — Emergency Department (HOSPITAL_COMMUNITY): Payer: Medicaid Other

## 2014-11-25 ENCOUNTER — Encounter (HOSPITAL_COMMUNITY): Payer: Self-pay | Admitting: Emergency Medicine

## 2014-11-25 ENCOUNTER — Other Ambulatory Visit (HOSPITAL_COMMUNITY): Payer: Self-pay

## 2014-11-25 DIAGNOSIS — Y92488 Other paved roadways as the place of occurrence of the external cause: Secondary | ICD-10-CM

## 2014-11-25 DIAGNOSIS — R45851 Suicidal ideations: Secondary | ICD-10-CM | POA: Diagnosis not present

## 2014-11-25 DIAGNOSIS — E876 Hypokalemia: Secondary | ICD-10-CM | POA: Diagnosis not present

## 2014-11-25 DIAGNOSIS — G894 Chronic pain syndrome: Secondary | ICD-10-CM | POA: Diagnosis present

## 2014-11-25 DIAGNOSIS — E860 Dehydration: Secondary | ICD-10-CM | POA: Diagnosis present

## 2014-11-25 DIAGNOSIS — G43909 Migraine, unspecified, not intractable, without status migrainosus: Secondary | ICD-10-CM | POA: Diagnosis present

## 2014-11-25 DIAGNOSIS — Z882 Allergy status to sulfonamides status: Secondary | ICD-10-CM | POA: Diagnosis not present

## 2014-11-25 DIAGNOSIS — R7989 Other specified abnormal findings of blood chemistry: Secondary | ICD-10-CM | POA: Diagnosis present

## 2014-11-25 DIAGNOSIS — R079 Chest pain, unspecified: Secondary | ICD-10-CM

## 2014-11-25 DIAGNOSIS — K759 Inflammatory liver disease, unspecified: Secondary | ICD-10-CM

## 2014-11-25 DIAGNOSIS — S0083XA Contusion of other part of head, initial encounter: Secondary | ICD-10-CM | POA: Diagnosis present

## 2014-11-25 DIAGNOSIS — M47892 Other spondylosis, cervical region: Secondary | ICD-10-CM | POA: Diagnosis present

## 2014-11-25 DIAGNOSIS — F419 Anxiety disorder, unspecified: Secondary | ICD-10-CM | POA: Diagnosis present

## 2014-11-25 DIAGNOSIS — Z6823 Body mass index (BMI) 23.0-23.9, adult: Secondary | ICD-10-CM

## 2014-11-25 DIAGNOSIS — F332 Major depressive disorder, recurrent severe without psychotic features: Secondary | ICD-10-CM | POA: Diagnosis not present

## 2014-11-25 DIAGNOSIS — F4000 Agoraphobia, unspecified: Secondary | ICD-10-CM | POA: Diagnosis not present

## 2014-11-25 DIAGNOSIS — F329 Major depressive disorder, single episode, unspecified: Secondary | ICD-10-CM | POA: Diagnosis present

## 2014-11-25 DIAGNOSIS — Z90710 Acquired absence of both cervix and uterus: Secondary | ICD-10-CM

## 2014-11-25 DIAGNOSIS — T391X2A Poisoning by 4-Aminophenol derivatives, intentional self-harm, initial encounter: Principal | ICD-10-CM | POA: Diagnosis present

## 2014-11-25 DIAGNOSIS — F17210 Nicotine dependence, cigarettes, uncomplicated: Secondary | ICD-10-CM | POA: Diagnosis present

## 2014-11-25 DIAGNOSIS — Z881 Allergy status to other antibiotic agents status: Secondary | ICD-10-CM

## 2014-11-25 DIAGNOSIS — T391X1A Poisoning by 4-Aminophenol derivatives, accidental (unintentional), initial encounter: Secondary | ICD-10-CM | POA: Diagnosis present

## 2014-11-25 DIAGNOSIS — R569 Unspecified convulsions: Secondary | ICD-10-CM | POA: Diagnosis not present

## 2014-11-25 DIAGNOSIS — G40909 Epilepsy, unspecified, not intractable, without status epilepticus: Secondary | ICD-10-CM | POA: Diagnosis present

## 2014-11-25 DIAGNOSIS — M1990 Unspecified osteoarthritis, unspecified site: Secondary | ICD-10-CM | POA: Diagnosis present

## 2014-11-25 DIAGNOSIS — F502 Bulimia nervosa: Secondary | ICD-10-CM | POA: Diagnosis present

## 2014-11-25 DIAGNOSIS — F319 Bipolar disorder, unspecified: Secondary | ICD-10-CM | POA: Diagnosis present

## 2014-11-25 DIAGNOSIS — W010XXA Fall on same level from slipping, tripping and stumbling without subsequent striking against object, initial encounter: Secondary | ICD-10-CM | POA: Diagnosis present

## 2014-11-25 DIAGNOSIS — N179 Acute kidney failure, unspecified: Secondary | ICD-10-CM | POA: Diagnosis present

## 2014-11-25 DIAGNOSIS — R630 Anorexia: Secondary | ICD-10-CM | POA: Diagnosis present

## 2014-11-25 DIAGNOSIS — Z9049 Acquired absence of other specified parts of digestive tract: Secondary | ICD-10-CM | POA: Diagnosis present

## 2014-11-25 DIAGNOSIS — Z91040 Latex allergy status: Secondary | ICD-10-CM

## 2014-11-25 DIAGNOSIS — Z886 Allergy status to analgesic agent status: Secondary | ICD-10-CM | POA: Diagnosis not present

## 2014-11-25 DIAGNOSIS — F410 Panic disorder [episodic paroxysmal anxiety] without agoraphobia: Secondary | ICD-10-CM | POA: Diagnosis not present

## 2014-11-25 LAB — CBC WITH DIFFERENTIAL/PLATELET
BASOS ABS: 0 10*3/uL (ref 0.0–0.1)
BASOS PCT: 0 % (ref 0–1)
EOS ABS: 0.2 10*3/uL (ref 0.0–0.7)
Eosinophils Relative: 2 % (ref 0–5)
HCT: 40.7 % (ref 36.0–46.0)
Hemoglobin: 14.2 g/dL (ref 12.0–15.0)
Lymphocytes Relative: 31 % (ref 12–46)
Lymphs Abs: 2.9 10*3/uL (ref 0.7–4.0)
MCH: 32.3 pg (ref 26.0–34.0)
MCHC: 34.9 g/dL (ref 30.0–36.0)
MCV: 92.7 fL (ref 78.0–100.0)
MONO ABS: 0.4 10*3/uL (ref 0.1–1.0)
Monocytes Relative: 4 % (ref 3–12)
Neutro Abs: 5.9 10*3/uL (ref 1.7–7.7)
Neutrophils Relative %: 63 % (ref 43–77)
Platelets: 313 10*3/uL (ref 150–400)
RBC: 4.39 MIL/uL (ref 3.87–5.11)
RDW: 13.3 % (ref 11.5–15.5)
WBC: 9.4 10*3/uL (ref 4.0–10.5)

## 2014-11-25 LAB — COMPREHENSIVE METABOLIC PANEL
ALT: 212 U/L — ABNORMAL HIGH (ref 0–35)
ANION GAP: 14 (ref 5–15)
AST: 165 U/L — ABNORMAL HIGH (ref 0–37)
Albumin: 4.6 g/dL (ref 3.5–5.2)
Alkaline Phosphatase: 99 U/L (ref 39–117)
BUN: 21 mg/dL (ref 6–23)
CO2: 20 mmol/L (ref 19–32)
Calcium: 10.2 mg/dL (ref 8.4–10.5)
Chloride: 103 mmol/L (ref 96–112)
Creatinine, Ser: 1.14 mg/dL — ABNORMAL HIGH (ref 0.50–1.10)
GFR calc non Af Amer: 56 mL/min — ABNORMAL LOW (ref >90)
GFR, EST AFRICAN AMERICAN: 65 mL/min — AB (ref >90)
GLUCOSE: 74 mg/dL (ref 70–99)
POTASSIUM: 3.5 mmol/L (ref 3.5–5.1)
Sodium: 137 mmol/L (ref 135–145)
TOTAL PROTEIN: 7.6 g/dL (ref 6.0–8.3)
Total Bilirubin: 1.2 mg/dL (ref 0.3–1.2)

## 2014-11-25 LAB — SALICYLATE LEVEL: Salicylate Lvl: <4 mg/dL (ref 2.8–20.0)

## 2014-11-25 LAB — ACETAMINOPHEN LEVEL: Acetaminophen (Tylenol), Serum: 53 ug/mL — ABNORMAL HIGH (ref 10–30)

## 2014-11-25 LAB — ETHANOL: Alcohol, Ethyl (B): <5 mg/dL (ref 0–9)

## 2014-11-25 LAB — PROTIME-INR
INR: 0.93 (ref 0.00–1.49)
Prothrombin Time: 12.6 seconds (ref 11.6–15.2)

## 2014-11-25 MED ORDER — SODIUM CHLORIDE 0.9 % IV BOLUS (SEPSIS)
1000.0000 mL | Freq: Once | INTRAVENOUS | Status: AC
  Administered 2014-11-25: 1000 mL via INTRAVENOUS

## 2014-11-25 MED ORDER — DEXTROSE 5 % IV SOLN
15.0000 mg/kg/h | INTRAVENOUS | Status: DC
  Administered 2014-11-25: 15 mg/kg/h via INTRAVENOUS
  Filled 2014-11-25 (×3): qty 150

## 2014-11-25 MED ORDER — ONDANSETRON HCL 4 MG/2ML IJ SOLN
4.0000 mg | Freq: Once | INTRAMUSCULAR | Status: AC
  Administered 2014-11-25: 4 mg via INTRAVENOUS
  Filled 2014-11-25: qty 2

## 2014-11-25 MED ORDER — ACETYLCYSTEINE LOAD VIA INFUSION
150.0000 mg/kg | Freq: Once | INTRAVENOUS | Status: AC
  Administered 2014-11-25: 8235 mg via INTRAVENOUS
  Filled 2014-11-25: qty 206

## 2014-11-25 NOTE — ED Notes (Signed)
Notified EDP of pt c/o abdominal, back and head pain. Pt given Pt Bill of Rights per pt request. Pt aware of admission to hospital to further monitor after acetaminophen overdose.

## 2014-11-25 NOTE — ED Notes (Signed)
Contacted lab to add-on PT-INR

## 2014-11-25 NOTE — ED Notes (Signed)
Security, Psychiatrist notified.

## 2014-11-25 NOTE — H&P (Addendum)
[facility] History and Physical  [name] TKZ:[id number] DOB: Jul 23, 1966 DOA: 11/25/2014  Referring physician: ER physician. PCP: No PCP Per Patient   Chief Complaint: Tylenol overdose.  HPI: [name] is a 49 y.o. female with history of depression, seizure, chronic pain who was admitted last month for Tylenol overdose was brought to the ER after patient had taken intentionally overdose of Tylenol since today morning. Patient states she has been having chronic pain and she was depressed and suicidal and started taking Tylenol intentionally to commit suicide. Patient has taken multiple doses of Tylenol. She also has been having nausea vomiting since morning. She also had fallen and hit her head when she was walking and tripped on her dog. In the ER CT head shows some contusion. Tylenol level was elevated with elevated LFTs. Poison control was contacted and patient has been started on IV Mucomyst. Patient has been placed on suicide precautions. Patient also has been complaining of some chest pain. Chest pain happens only on deep breathing. Denies any productive cough fever chills.   Review of Systems: As presented in the history of presenting illness, rest negative.  Past Medical History  Diagnosis Date  . Migraines   . Endometriosis   . ETOH abuse     sober 3 1/2 years  . Anorexia   . Depression   . Anxiety   . DJD (degenerative joint disease) of cervical spine   . Seizures   . PICC (peripherally inserted central catheter) in place     rt neck   Past Surgical History  Procedure Laterality Date  . Knee surgery    . Abdominal surgery    . Cholecystectomy    . Appendectomy    . Abdominal hysterectomy    . Nasal sinus surgery    . Laproscopy    . Carpal tunnel release      2010  . Esophagogastroduodenoscopy N/A 10/06/2014    Procedure: ESOPHAGOGASTRODUODENOSCOPY (EGD);  Surgeon: Inda Castle, MD;  Location: [place];  Service: Endoscopy;  Laterality: N/A;   Social  History:  reports that she has been smoking Cigarettes.  She has a 5 pack-year smoking history. She has never used smokeless tobacco. She reports that she does not drink alcohol or use illicit drugs. Where does patient live home. Can patient participate in ADLs? Yes.  Allergies  Allergen Reactions  . Aspirin Anaphylaxis  . Doxycycline Anaphylaxis and Other (See Comments)    Joint damage also  . Imitrex [Sumatriptan Base] Other (See Comments)    Severe hypertension  . Iodinated Diagnostic Agents Anaphylaxis  . Lidocaine Anaphylaxis    Pt states she does well with Marcaine/Bupivicaine without problem  . Prednisone Other (See Comments)    She goes crazy  . Sulfa Antibiotics Anaphylaxis  . Sulfasalazine Anaphylaxis  . Sumatriptan Other (See Comments)    Felt like she was "having a stroke", heart rate and blood pressure "went through the roof"  . Tramadol Other (See Comments)    seizures  . Levofloxacin Other (See Comments)    Joints hurt  . Latex Rash  . Levofloxacin Rash and Other (See Comments)    Joints hurt  . Metrizamide Other (See Comments)  . Nsaids Rash and Other (See Comments)    GI bleed  . Tolmetin Rash    Family History:  Family History  Problem Relation Age of Onset  . Hypertension Mother   . Hyperlipidemia Mother   . Heart failure Father   . Cancer Other  Prior to Admission medications   Medication Sig Start Date End Date Taking? Authorizing Provider  busPIRone (BUSPAR) 7.5 MG tablet Take 1 tablet (7.5 mg total) by mouth 2 (two) times daily. 10/19/14  Yes Knox Royalty, NP  Calcium Carbonate-Vitamin D (CALCIUM-VITAMIN D3 PO) Take 1 capsule by mouth daily.   Yes Historical Provider, MD  carisoprodol (SOMA) 350 MG tablet Take 1 tablet (350 mg total) by mouth at bedtime. Patient taking differently: Take 350 mg by mouth 2 (two) times daily.  10/19/14  Yes Knox Royalty, NP  clonazePAM (KLONOPIN) 1 MG tablet Take 1 tablet (1 mg total) by mouth 3 (three) times  daily as needed (anxiety). 10/19/14  Yes Knox Royalty, NP  diphenhydrAMINE (BENADRYL) 25 MG tablet Take 25 mg by mouth every 6 (six) hours as needed for itching or allergies.   Yes Historical Provider, MD  FLUoxetine (PROZAC) 20 MG capsule Take 1 capsule (20 mg total) by mouth daily. 10/19/14  Yes Knox Royalty, NP  levETIRAcetam (KEPPRA) 500 MG tablet Take 1 tablet (500 mg total) by mouth 2 (two) times daily. 10/19/14  Yes Knox Royalty, NP  Multiple Vitamin (MULTIVITAMIN WITH MINERALS) TABS tablet Take 1 tablet by mouth daily.   Yes Historical Provider, MD  pantoprazole (PROTONIX) 40 MG tablet Take 1 tablet (40 mg total) by mouth 2 (two) times daily. 10/19/14  Yes Knox Royalty, NP  gabapentin (NEURONTIN) 100 MG capsule Take 1 capsule (100 mg total) by mouth 3 (three) times daily. 10/19/14   Knox Royalty, NP    Physical Exam: Filed Vitals:   11/25/14 2015 11/25/14 2026 11/25/14 2100 11/25/14 2200  BP: 137/90  141/93 134/95  Pulse: 107  106 102  Temp:      TempSrc:      Resp: 19  24 17   Weight:  54.9 kg (121 lb 0.5 oz)    SpO2: 99%  100% 99%     General:  Moderately built and nourished.  Eyes: Anicteric no pallor.  ENT: No discharge from ears eyes nose or mouth.  Neck: No mass felt.  Cardiovascular: S1 and S2 heard.  Respiratory: No rhonchi or crepitations.  Abdomen: Soft nontender bowel sounds present.  Skin: Small ecchymotic area on the left forehead.  Musculoskeletal: No edema.  Psychiatric: Appears normal.  Neurologic: Alert awake oriented to time place and person. Moves all extremities.  Labs on Admission:  Basic Metabolic Panel:  Recent Labs Lab 11/25/14 1950  NA 137  K 3.5  CL 103  CO2 20  GLUCOSE 74  BUN 21  CREATININE 1.14*  CALCIUM 10.2   Liver Function Tests:  Recent Labs Lab 11/25/14 1950  AST 165*  ALT 212*  ALKPHOS 99  BILITOT 1.2  PROT 7.6  ALBUMIN 4.6   No results for input(s): LIPASE, AMYLASE in the last 168 hours. No results for  input(s): AMMONIA in the last 168 hours. CBC:  Recent Labs Lab 11/25/14 1950  WBC 9.4  NEUTROABS 5.9  HGB 14.2  HCT 40.7  MCV 92.7  PLT 313   Cardiac Enzymes: No results for input(s): CKTOTAL, CKMB, CKMBINDEX, TROPONINI in the last 168 hours.  BNP (last 3 results) No results for input(s): BNP in the last 8760 hours.  ProBNP (last 3 results) No results for input(s): PROBNP in the last 8760 hours.  CBG: No results for input(s): GLUCAP in the last 168 hours.  Radiological Exams on Admission: Ct Head Wo Contrast  11/25/2014   CLINICAL  DATA:  Status post fall the night of 11/24/2014 with a blow to the right side of the head. Vomiting.  EXAM: CT HEAD WITHOUT CONTRAST  TECHNIQUE: Contiguous axial images were obtained from the base of the skull through the vertex without intravenous contrast.  COMPARISON:  Head CT scan 04/07/2014 and brain MRI 03/20/2014.  FINDINGS: The brain appears normal without hemorrhage, infarct, mass lesion, mass effect, midline shift or abnormal extra-axial fluid collection. Small appearing contusion over the frontal bone on the left is noted. No acute fracture is identified. Remote fracture of the lateral wall left orbit is again seen. There is no hydrocephalus or pneumocephalus. Imaged paranasal sinuses and mastoid air cells are clear.  IMPRESSION: Soft tissue contusion over the left frontal bone without underlying fracture or acute intracranial abnormality.  Remote fractures of the lateral wall of the left orbit and left zygomatic arch.   Electronically Signed   By: Inge Rise M.D.   On: 11/25/2014 20:17    EKG: Independently reviewed. Normal sinus rhythm with RSR pattern.  Assessment/Plan Principal Problem:   Intentional acetaminophen overdose Active Problems:   Tylenol overdose   Major depression   Seizure disorder   Suicidal ideation   ARF (acute renal failure)   1. Intentional overdose of Tylenol with suicidal ideation - patient has been placed  on suicide precautions with sitter. Patient has been started on IV Mucomyst. I did discuss with poison control. Recheck LFTs and INR and Tylenol level I hour prior to discontinuation of IV Mucomyst at 23 hours from start of Mucomyst. Closely follow mental status. Poison control has advised that the patient has not taken overdose of other medication is okay to continue patient's other medications including antidepressants and seizure medications. 2. Chest pain - appears atypical probably from nausea vomiting. Cycle cardiac markers. 3. Elevated LFTs - patient has chronically elevated LFTs. Since patient has acute Tylenol overdose. Closely follow LFTs. Follow INR. 4. Seizure disorder - see #1. On Keppra. 5. Acute renal failure - probably from nausea and vomiting. Hydrate and closely follow metabolic panel. Check UA. 6. Depression with suicidal ideation - consult psychiatry name. Patient has been placed on suicide precautions. 7. Chronic pain - continue home medications. 8. Left forehead contusion after fall - observe.   DVT Prophylaxis SCDs.  Code Status: Full code.  Family Communication: None.  Disposition Plan: Admit to inpatient.    Jashley Yellin N. [facility] Pager [phone].  If 7PM-7AM, please contact night-coverage [email or web address] Password [email or web address] 11/25/2014, 10:18 PM

## 2014-11-25 NOTE — Progress Notes (Signed)
MEDICATION RELATED CONSULT NOTE - INITIAL   Pharmacy Consult for NAC  Indication: tylenol overdose  Vital Signs: Temp: 98.4 F (36.9 C) (03/07 1856) Temp Source: Oral (03/07 1856) BP: 137/90 mmHg (03/07 2015) Pulse Rate: 107 (03/07 2015)  Labs:  Recent Labs  11/25/14 1950  WBC 9.4  HGB 14.2  HCT 40.7  PLT 313   CrCl cannot be calculated (Patient has no serum creatinine result on file.).  Medical History: Past Medical History  Diagnosis Date  . Migraines   . Endometriosis   . ETOH abuse     sober 3 1/2 years  . Anorexia   . Depression   . Anxiety   . DJD (degenerative joint disease) of cervical spine   . Seizures   . PICC (peripherally inserted central catheter) in place     rt neck   Assessment: 89 yof presented to the ED after taking "about 80" of extra-strength tylenol since last night in an effort to harm herself. At some point she fell and hit her head with possible loss of consciousness. MD recommended IV NAC due to history of similar along with previous liver damage dysfunction and pt unwillingness to take PO. AST/ALT are both currently elevated but Tbili is WNL. Scr is also above baseline. Tylenol level is elevated at 53, alcohol is undetectable and salicylate level is undetectable. MD to contact [facility]. INR is still pending along with urine drug screen.   Goal of Therapy:  Prevention of tylenol toxicity  Plan: - Start IV NAC $Remo'150mg'MjXdE$ /kg load followed by $RemoveBef'15mg'YDPlMydhzi$ /kg/hr infusion - Daily CMET, INR and tylenol level - F/u [facility] recommendation - Consider DC NAC with physician approval and [facility] recommendation if the following conditions are met: pt has completed 24 hours of IV NAC, pt is asymptomatic, AST/ALT have normalized, serum bicarbonate ? 20 or pH ? 7.25, INR <2 (if appropriate) and acetaminophen level ? 40mcg/ml  Salome Arnt, PharmD, BCPS Pager # [phone] 11/25/2014 8:39 PM

## 2014-11-25 NOTE — ED Notes (Signed)
Report given to Bryan,RN.

## 2014-11-25 NOTE — ED Notes (Signed)
Pt reports she has been taking hand fulls of tylenol since last night in efforts to harm herself. Also reports that she tripped over her dog going down the stairs last night hitting her head. Pt thinks she lost consciousness. Small abrasion and knot on forehead. Pt reports she has been vomiting.

## 2014-11-25 NOTE — ED Notes (Signed)
Admitting MD at bedside.

## 2014-11-25 NOTE — ED Notes (Signed)
Pt wallet sent home with neighbor per pt request.

## 2014-11-25 NOTE — ED Notes (Addendum)
IV attempt unsuccessful. Pt states she took "about 80" tylenol, "extra strength." Pt alert and oriented x4, walking, able to follow commands. In NAD.

## 2014-11-25 NOTE — ED Notes (Signed)
EDP at bedside discussing plan of care with patient.

## 2014-11-26 LAB — URINE MICROSCOPIC-ADD ON

## 2014-11-26 LAB — URINALYSIS, ROUTINE W REFLEX MICROSCOPIC
BILIRUBIN URINE: NEGATIVE
Glucose, UA: NEGATIVE mg/dL
Hgb urine dipstick: NEGATIVE
Ketones, ur: >80 mg/dL — AB
Nitrite: NEGATIVE
Protein, ur: NEGATIVE mg/dL
Specific Gravity, Urine: 1.038 — ABNORMAL HIGH (ref 1.005–1.030)
Urobilinogen, UA: 0.2 mg/dL (ref 0.0–1.0)
pH: 6 (ref 5.0–8.0)

## 2014-11-26 LAB — RAPID URINE DRUG SCREEN, HOSP PERFORMED
Amphetamines: NOT DETECTED
Barbiturates: NOT DETECTED
Benzodiazepines: POSITIVE — AB
Cocaine: NOT DETECTED
OPIATES: NOT DETECTED
Tetrahydrocannabinol: NOT DETECTED

## 2014-11-26 LAB — COMPREHENSIVE METABOLIC PANEL
ALBUMIN: 3.3 g/dL — AB (ref 3.5–5.2)
ALK PHOS: 74 U/L (ref 39–117)
ALT: 146 U/L — AB (ref 0–35)
ALT: 115 U/L — ABNORMAL HIGH (ref 0–35)
ANION GAP: 10 (ref 5–15)
AST: 91 U/L — AB (ref 0–37)
AST: 76 U/L — AB (ref 0–37)
Albumin: 3.4 g/dL — ABNORMAL LOW (ref 3.5–5.2)
Alkaline Phosphatase: 86 U/L (ref 39–117)
Anion gap: 10 (ref 5–15)
BILIRUBIN TOTAL: 0.9 mg/dL (ref 0.3–1.2)
BUN: 14 mg/dL (ref 6–23)
BUN: 7 mg/dL (ref 6–23)
CALCIUM: 8.9 mg/dL (ref 8.4–10.5)
CALCIUM: 9.2 mg/dL (ref 8.4–10.5)
CO2: 22 mmol/L (ref 19–32)
CO2: 22 mmol/L (ref 19–32)
Chloride: 107 mmol/L (ref 96–112)
Chloride: 111 mmol/L (ref 96–112)
Creatinine, Ser: 0.88 mg/dL (ref 0.50–1.10)
Creatinine, Ser: 0.91 mg/dL (ref 0.50–1.10)
GFR calc Af Amer: 89 mL/min — ABNORMAL LOW (ref >90)
GFR calc Af Amer: 85 mL/min — ABNORMAL LOW (ref >90)
GFR, EST NON AFRICAN AMERICAN: 76 mL/min — AB (ref >90)
GFR, EST NON AFRICAN AMERICAN: 73 mL/min — AB (ref >90)
GLUCOSE: 71 mg/dL (ref 70–99)
Glucose, Bld: 104 mg/dL — ABNORMAL HIGH (ref 70–99)
POTASSIUM: 3 mmol/L — AB (ref 3.5–5.1)
Potassium: 3.9 mmol/L (ref 3.5–5.1)
SODIUM: 139 mmol/L (ref 135–145)
SODIUM: 143 mmol/L (ref 135–145)
TOTAL PROTEIN: 5.6 g/dL — AB (ref 6.0–8.3)
Total Bilirubin: 0.8 mg/dL (ref 0.3–1.2)
Total Protein: 5.7 g/dL — ABNORMAL LOW (ref 6.0–8.3)

## 2014-11-26 LAB — ACETAMINOPHEN LEVEL: Acetaminophen (Tylenol), Serum: <10 ug/mL — ABNORMAL LOW (ref 10–30)

## 2014-11-26 LAB — CBC WITH DIFFERENTIAL/PLATELET
BASOS ABS: 0 10*3/uL (ref 0.0–0.1)
BASOS PCT: 0 % (ref 0–1)
Eosinophils Absolute: 0.2 10*3/uL (ref 0.0–0.7)
Eosinophils Relative: 3 % (ref 0–5)
HCT: 34.3 % — ABNORMAL LOW (ref 36.0–46.0)
HEMOGLOBIN: 12 g/dL (ref 12.0–15.0)
LYMPHS PCT: 46 % (ref 12–46)
Lymphs Abs: 3.4 10*3/uL (ref 0.7–4.0)
MCH: 32.2 pg (ref 26.0–34.0)
MCHC: 35 g/dL (ref 30.0–36.0)
MCV: 92 fL (ref 78.0–100.0)
MONOS PCT: 6 % (ref 3–12)
Monocytes Absolute: 0.5 10*3/uL (ref 0.1–1.0)
NEUTROS ABS: 3.4 10*3/uL (ref 1.7–7.7)
NEUTROS PCT: 45 % (ref 43–77)
Platelets: 268 10*3/uL (ref 150–400)
RBC: 3.73 MIL/uL — ABNORMAL LOW (ref 3.87–5.11)
RDW: 13.5 % (ref 11.5–15.5)
WBC: 7.5 10*3/uL (ref 4.0–10.5)

## 2014-11-26 LAB — PREGNANCY, URINE: Preg Test, Ur: NEGATIVE

## 2014-11-26 LAB — CK: CK TOTAL: 49 U/L (ref 7–177)

## 2014-11-26 LAB — TROPONIN I: Troponin I: <0.03 ng/mL (ref <0.031)

## 2014-11-26 LAB — TSH: TSH: 0.292 u[IU]/mL — AB (ref 0.350–4.500)

## 2014-11-26 LAB — PROTIME-INR
INR: 1.08 (ref 0.00–1.49)
PROTHROMBIN TIME: 14.1 s (ref 11.6–15.2)

## 2014-11-26 MED ORDER — OXYCODONE HCL 5 MG PO TABS
5.0000 mg | ORAL_TABLET | Freq: Once | ORAL | Status: AC
  Administered 2014-11-26: 5 mg via ORAL
  Filled 2014-11-26: qty 1

## 2014-11-26 MED ORDER — FLUOXETINE HCL 20 MG PO CAPS
20.0000 mg | ORAL_CAPSULE | Freq: Every day | ORAL | Status: DC
  Administered 2014-11-26 – 2014-11-28 (×3): 20 mg via ORAL
  Filled 2014-11-26 (×4): qty 1

## 2014-11-26 MED ORDER — SODIUM CHLORIDE 0.9 % IV SOLN
INTRAVENOUS | Status: AC
  Administered 2014-11-26 (×3): via INTRAVENOUS

## 2014-11-26 MED ORDER — LEVETIRACETAM 500 MG PO TABS
500.0000 mg | ORAL_TABLET | Freq: Two times a day (BID) | ORAL | Status: DC
  Administered 2014-11-26 – 2014-11-28 (×6): 500 mg via ORAL
  Filled 2014-11-26 (×9): qty 1

## 2014-11-26 MED ORDER — CARISOPRODOL 350 MG PO TABS
350.0000 mg | ORAL_TABLET | Freq: Two times a day (BID) | ORAL | Status: DC
  Administered 2014-11-26 – 2014-11-28 (×5): 350 mg via ORAL
  Filled 2014-11-26 (×5): qty 1

## 2014-11-26 MED ORDER — POTASSIUM CHLORIDE CRYS ER 20 MEQ PO TBCR
40.0000 meq | EXTENDED_RELEASE_TABLET | Freq: Once | ORAL | Status: AC
  Administered 2014-11-26: 40 meq via ORAL
  Filled 2014-11-26: qty 2

## 2014-11-26 MED ORDER — MORPHINE SULFATE 2 MG/ML IJ SOLN
1.0000 mg | Freq: Once | INTRAMUSCULAR | Status: AC
  Administered 2014-11-26: 2 mg via INTRAVENOUS
  Filled 2014-11-26: qty 1

## 2014-11-26 MED ORDER — PANTOPRAZOLE SODIUM 40 MG PO TBEC
40.0000 mg | DELAYED_RELEASE_TABLET | Freq: Two times a day (BID) | ORAL | Status: DC
  Administered 2014-11-26 – 2014-11-28 (×6): 40 mg via ORAL
  Filled 2014-11-26 (×6): qty 1

## 2014-11-26 MED ORDER — ONDANSETRON HCL 4 MG/2ML IJ SOLN
4.0000 mg | Freq: Four times a day (QID) | INTRAMUSCULAR | Status: DC | PRN
  Administered 2014-11-26 – 2014-11-28 (×5): 4 mg via INTRAVENOUS
  Filled 2014-11-26 (×5): qty 2

## 2014-11-26 MED ORDER — POTASSIUM CHLORIDE CRYS ER 20 MEQ PO TBCR: 30.0000 meq | EXTENDED_RELEASE_TABLET | Freq: Once | ORAL | Status: DC

## 2014-11-26 MED ORDER — CLONAZEPAM 1 MG PO TABS
1.0000 mg | ORAL_TABLET | Freq: Three times a day (TID) | ORAL | Status: DC | PRN
  Administered 2014-11-26 – 2014-11-28 (×5): 1 mg via ORAL
  Filled 2014-11-26 (×5): qty 1

## 2014-11-26 MED ORDER — BUSPIRONE HCL 15 MG PO TABS
7.5000 mg | ORAL_TABLET | Freq: Two times a day (BID) | ORAL | Status: DC
  Administered 2014-11-26 – 2014-11-28 (×6): 7.5 mg via ORAL
  Filled 2014-11-26 (×9): qty 1

## 2014-11-26 MED ORDER — ONDANSETRON HCL 4 MG PO TABS: 4.0000 mg | ORAL_TABLET | Freq: Four times a day (QID) | ORAL | Status: DC | PRN

## 2014-11-26 NOTE — ED Notes (Signed)
Called service response to inform that patient moved to new room.

## 2014-11-26 NOTE — Progress Notes (Signed)
[facility] PROGRESS NOTE  [name] QAS:[id number] DOB: Feb 18, 1966 DOA: 11/25/2014 PCP: No PCP Per Patient  Assessment/Plan: 1. Tylenol overdose: Intentional. Currently denies any suicidal ideations. Denies any nausea or vomiting. Has mild elevated transaminases.  Also reports headache from the fall. She is on IV mucomyst and a repeat CMP ordered for tonight. Her encephalopathy resolved. Recommend checking CMP with PT/INR in am.  Suicidal precautions on board and sitter at bedside.  Psychiatry consulted and recommendations pending.  Repeat tylenol level is less than 10.    Hypokalemia replete as needed. Check repeat potassium in am and magnesium.    Headache ; from the fall. Mild contusion over the left forehead. Pain control.    Acute renal failure: probably from volume depletion from dehydration, resolved with IV fluids.    Elevated liver function tests: Probably from the use of tylenol. Improving.   Abnormal UA: She denies any dysuria symptoms. Urine cultures sent.   Low TSH: Free t4 and free t3 ordered and pending.       Code Status: full code Family Communication: sitter at bedside Disposition Plan: remain in the stepdown   Consultants:  psychaitry  Procedures:  none  Antibiotics:  none  HPI/Subjective: Headache.   Objective: Filed Vitals:   11/26/14 1229  BP: 132/76  Pulse: 97  Temp: 98.1 F (36.7 C)  Resp: 14    Intake/Output Summary (Last 24 hours) at 11/26/14 1529 Last data filed at 11/26/14 1401  Gross per 24 hour  Intake 1588.6 ml  Output      0 ml  Net 1588.6 ml   Filed Weights   11/25/14 2026 11/26/14 0600  Weight: 54.9 kg (121 lb 0.5 oz) 55.4 kg (122 lb 2.2 oz)    Exam:   General:  Alert afebrile in mild distress from headache.   Cardiovascular: s1s2, no mrg, RRR  Respiratory: clear to ausculation, no wheezing or rhonchi  Abdomen: soft non tender non distended bowel sounds heard.   Musculoskeletal: no pedal  edema.   Data Reviewed: Basic Metabolic Panel:  Recent Labs Lab 11/25/14 1950 11/26/14 0525  NA 137 139  K 3.5 3.0*  CL 103 107  CO2 20 22  GLUCOSE 74 71  BUN 21 14  CREATININE 1.14* 0.88  CALCIUM 10.2 8.9   Liver Function Tests:  Recent Labs Lab 11/25/14 1950 11/26/14 0525  AST 165* 91*  ALT 212* 146*  ALKPHOS 99 74  BILITOT 1.2 0.9  PROT 7.6 5.7*  ALBUMIN 4.6 3.4*   No results for input(s): LIPASE, AMYLASE in the last 168 hours. No results for input(s): AMMONIA in the last 168 hours. CBC:  Recent Labs Lab 11/25/14 1950 11/26/14 0645  WBC 9.4 7.5  NEUTROABS 5.9 3.4  HGB 14.2 12.0  HCT 40.7 34.3*  MCV 92.7 92.0  PLT 313 268   Cardiac Enzymes:  Recent Labs Lab 11/26/14 0525  CKTOTAL 28  TROPONINI <0.03   BNP (last 3 results) No results for input(s): BNP in the last 8760 hours.  ProBNP (last 3 results) No results for input(s): PROBNP in the last 8760 hours.  CBG: No results for input(s): GLUCAP in the last 168 hours.  No results found for this or any previous visit (from the past 240 hour(s)).   Studies: Ct Head Wo Contrast  11/25/2014   CLINICAL DATA:  Status post fall the night of 11/24/2014 with a blow to the right side of the head. Vomiting.  EXAM: CT HEAD WITHOUT CONTRAST  TECHNIQUE: Contiguous  axial images were obtained from the base of the skull through the vertex without intravenous contrast.  COMPARISON:  Head CT scan 04/07/2014 and brain MRI 03/20/2014.  FINDINGS: The brain appears normal without hemorrhage, infarct, mass lesion, mass effect, midline shift or abnormal extra-axial fluid collection. Small appearing contusion over the frontal bone on the left is noted. No acute fracture is identified. Remote fracture of the lateral wall left orbit is again seen. There is no hydrocephalus or pneumocephalus. Imaged paranasal sinuses and mastoid air cells are clear.  IMPRESSION: Soft tissue contusion over the left frontal bone without underlying  fracture or acute intracranial abnormality.  Remote fractures of the lateral wall of the left orbit and left zygomatic arch.   Electronically Signed   By: Inge Rise M.D.   On: 11/25/2014 20:17   Dg Chest Port 1 View  11/25/2014   CLINICAL DATA:  Chest pain today. Migraines. History of alcohol abuse, depression, anxiety, seizures.  EXAM: PORTABLE CHEST - 1 VIEW  COMPARISON:  10/05/2014  FINDINGS: The heart size and mediastinal contours are within normal limits. Both lungs are clear. The visualized skeletal structures are unremarkable.  IMPRESSION: No active disease.   Electronically Signed   By: Lucienne Capers M.D.   On: 11/25/2014 22:16    Scheduled Meds: . busPIRone  7.5 mg Oral BID  . carisoprodol  350 mg Oral BID  . FLUoxetine  20 mg Oral Daily  . levETIRAcetam  500 mg Oral BID  . pantoprazole  40 mg Oral BID  . potassium chloride  30 mEq Oral Once   Continuous Infusions: . sodium chloride 100 mL/hr at 11/26/14 1319  . acetylcysteine 15 mg/kg/hr (11/26/14 0602)    Principal Problem:   Intentional acetaminophen overdose Active Problems:   Tylenol overdose   Major depression   Seizure disorder   Suicidal ideation   ARF (acute renal failure)    Time spent: 35 minutes   [facility] Pager [phone]  If 7PM-7AM, please contact night-coverage at [email or web address], password [facility] 11/26/2014, 3:29 PM  LOS: 1 day

## 2014-11-26 NOTE — Care Management Note (Signed)
    Page 1 of 1   11/26/2014     9:57:44 AM CARE MANAGEMENT NOTE 11/26/2014  Patient:  [name], [name]   Account Number:  [id number]  Date Initiated:  11/26/2014  Documentation initiated by:  Elissa Hefty  Subjective/Objective Assessment:   adm w tylenol overdose     Action/Plan:   lives alone   Anticipated DC Date:     Anticipated DC Plan:  [place] referral  Clinical Social Worker         Choice offered to / List presented to:             Status of service:   Medicare Important Message given?   (If response is "NO", the following Medicare IM given date fields will be blank) Date Medicare IM given:   Medicare IM given by:   Date Additional Medicare IM given:   Additional Medicare IM given by:    Discharge Disposition:    Per UR Regulation:  Reviewed for med. necessity/level of care/duration of stay  If discussed at [place] of Stay Meetings, dates discussed:    Comments:

## 2014-11-26 NOTE — Progress Notes (Signed)
CSW received two CSW orders this morning - one for Substance abuse and the other for Behavioral Health issues affecting hospitalization.  Patient noted to be on a suicide watch and admitted with intentional Tylenol overdose (also had same type of attempt last month per medical record).  CSW spoke to patient's nurse and requested that MD order a Psych consult for patient.  Once assessment is completed, then CSW can determine next course of action for patient or refer to Psych line CSW.  Lorie Phenix. Pauline Good, [place]

## 2014-11-26 NOTE — ED Notes (Signed)
Spoke with [place] with poison control for update. Patient currently receiving acetadote at 20.29mL/hr. She reviews that patient consumed tylenol between 3am and 4:30am.

## 2014-11-26 NOTE — ED Provider Notes (Signed)
CSN: [id number]     Arrival date & time 11/25/14  13 History   First MD Initiated Contact with Patient 11/25/14 1925     Chief Complaint  Patient presents with  . Head Injury  . Suicidal     (Consider location/radiation/quality/duration/timing/severity/associated sxs/prior Treatment) Patient is a 49 y.o. female presenting with head injury.  Head Injury Associated symptoms: headache and nausea   Associated symptoms: no neck pain and no vomiting      49 year old female with past medical history of anxiety, depression, chronic headaches, chronic pain, history of alcohol abuse, and history of multiple suicide attempts via Tylenol overdose, who presents with purposeful suicide attempts via ingestion of an estimated # 80 500 mg Tylenol tablets. Patient states that she took these Tylenol between the hours of 3 and 4:30 AM this morning. She states that she did so and a suicide attempt and that she is tired of living. She admits she has a history of previous Tylenol overdose and states she has been referred to [facility] for liver transplant, but does not want a liver as she expresses a will to die. She presented today due to her chronic lower back pain as well as nausea. She denies any new or change in her lower back pain. She denies any concomitant alcohol abuse and states she did not take any of her prescribed medicines with the Tylenol. Denies any fevers or chills. Denies any focal numbness or weakness. Of note, the patient states that she became drowsy after ingesting her Tylenol and subsequently fell and hit her head on a door stop. Denies any loss of consciousness.  Past Medical History  Diagnosis Date  . Migraines   . Endometriosis   . ETOH abuse     sober 3 1/2 years  . Anorexia   . Depression   . Anxiety   . DJD (degenerative joint disease) of cervical spine   . Seizures   . PICC (peripherally inserted central catheter) in place     rt neck   Past Surgical History  Procedure Laterality  Date  . Knee surgery    . Abdominal surgery    . Cholecystectomy    . Appendectomy    . Abdominal hysterectomy    . Nasal sinus surgery    . Laproscopy    . Carpal tunnel release      2010  . Esophagogastroduodenoscopy N/A 10/06/2014    Procedure: ESOPHAGOGASTRODUODENOSCOPY (EGD);  Surgeon: Inda Castle, MD;  Location: [place];  Service: Endoscopy;  Laterality: N/A;   Family History  Problem Relation Age of Onset  . Hypertension Mother   . Hyperlipidemia Mother   . Heart failure Father   . Cancer Other    History  Substance Use Topics  . Smoking status: Current Every Day Smoker -- 0.25 packs/day for 20 years    Types: Cigarettes  . Smokeless tobacco: Never Used  . Alcohol Use: No   OB History    No data available     Review of Systems  Constitutional: Positive for fatigue. Negative for fever and chills.  HENT: Negative for congestion, rhinorrhea and sore throat.   Eyes: Negative for visual disturbance.  Respiratory: Negative for cough, shortness of breath and wheezing.   Cardiovascular: Negative for chest pain and leg swelling.  Gastrointestinal: Positive for nausea and abdominal pain. Negative for vomiting and diarrhea.  Genitourinary: Negative for flank pain.  Musculoskeletal: Negative for neck pain and neck stiffness.  Skin: Negative for rash.  Allergic/Immunologic:  Negative for immunocompromised state.  Neurological: Positive for headaches. Negative for syncope.  Psychiatric/Behavioral: Positive for suicidal ideas and dysphoric mood.      Allergies  Aspirin; Doxycycline; Imitrex; Iodinated diagnostic agents; Lidocaine; Prednisone; Sulfa antibiotics; Sulfasalazine; Sumatriptan; Tramadol; Levofloxacin; Latex; Levofloxacin; Metrizamide; Nsaids; and Tolmetin  Home Medications   Prior to Admission medications   Medication Sig Start Date End Date Taking? Authorizing Provider  busPIRone (BUSPAR) 7.5 MG tablet Take 1 tablet (7.5 mg total) by mouth 2 (two)  times daily. 10/19/14  Yes Knox Royalty, NP  Calcium Carbonate-Vitamin D (CALCIUM-VITAMIN D3 PO) Take 1 capsule by mouth daily.   Yes Historical Provider, MD  carisoprodol (SOMA) 350 MG tablet Take 1 tablet (350 mg total) by mouth at bedtime. Patient taking differently: Take 350 mg by mouth 2 (two) times daily.  10/19/14  Yes Knox Royalty, NP  clonazePAM (KLONOPIN) 1 MG tablet Take 1 tablet (1 mg total) by mouth 3 (three) times daily as needed (anxiety). 10/19/14  Yes Knox Royalty, NP  diphenhydrAMINE (BENADRYL) 25 MG tablet Take 25 mg by mouth every 6 (six) hours as needed for itching or allergies.   Yes Historical Provider, MD  FLUoxetine (PROZAC) 20 MG capsule Take 1 capsule (20 mg total) by mouth daily. 10/19/14  Yes Knox Royalty, NP  levETIRAcetam (KEPPRA) 500 MG tablet Take 1 tablet (500 mg total) by mouth 2 (two) times daily. 10/19/14  Yes Knox Royalty, NP  Multiple Vitamin (MULTIVITAMIN WITH MINERALS) TABS tablet Take 1 tablet by mouth daily.   Yes Historical Provider, MD  pantoprazole (PROTONIX) 40 MG tablet Take 1 tablet (40 mg total) by mouth 2 (two) times daily. 10/19/14  Yes Knox Royalty, NP  gabapentin (NEURONTIN) 100 MG capsule Take 1 capsule (100 mg total) by mouth 3 (three) times daily. 10/19/14   Knox Royalty, NP   BP 157/88 mmHg  Pulse 105  Temp(Src) 98.4 F (36.9 C) (Oral)  Resp 14  Wt 121 lb 0.5 oz (54.9 kg)  SpO2 98% Physical Exam  Constitutional: She is oriented to person, place, and time. She appears well-developed and well-nourished. No distress.  HENT:  Head: Normocephalic and atraumatic.  Mouth/Throat: No oropharyngeal exudate.  Eyes: Conjunctivae are normal. Pupils are equal, round, and reactive to light.  Neck: Normal range of motion. Neck supple.  Cardiovascular: Normal rate, normal heart sounds and intact distal pulses.  Exam reveals no friction rub.   No murmur heard. Pulmonary/Chest: Effort normal and breath sounds normal. No respiratory distress. She has  no wheezes. She has no rales.  Abdominal: Soft. Bowel sounds are normal. She exhibits no distension. There is tenderness (Mild) in the right upper quadrant. There is no rebound and no guarding.  Musculoskeletal: She exhibits no edema.  Neurological: She is alert and oriented to person, place, and time.  Skin: Skin is warm. No rash noted.  Psychiatric: Her speech is normal. Her affect is angry. She is agitated and combative. She is not slowed. She expresses impulsivity and inappropriate judgment. She exhibits a depressed mood. She expresses suicidal ideation. She expresses no homicidal ideation. She expresses suicidal plans. She expresses no homicidal plans.  Nursing note and vitals reviewed.   Angiocath insertion Performed by: Duffy Bruce  Consent: Verbal consent obtained. Risks and benefits: risks, benefits and alternatives were discussed Time out: Immediately prior to procedure a "time out" was called to verify the correct patient, procedure, equipment, support staff and site/side marked as required.  Preparation: Patient  was prepped and draped in the usual sterile fashion.  Vein Location: Right forearm  Ultrasound Guided  Gauge: 20  Normal blood return and flush without difficulty Patient tolerance: Patient tolerated the procedure well with no immediate complications.    ED Course  Procedures (including critical care time) Labs Review Labs Reviewed  COMPREHENSIVE METABOLIC PANEL - Abnormal; Notable for the following:    Creatinine, Ser 1.14 (*)    AST 165 (*)    ALT 212 (*)    GFR calc non Af Amer 56 (*)    GFR calc Af Amer 65 (*)    All other components within normal limits  ACETAMINOPHEN LEVEL - Abnormal; Notable for the following:    Acetaminophen (Tylenol), Serum 53.0 (*)    All other components within normal limits  ETHANOL  SALICYLATE LEVEL  CBC WITH DIFFERENTIAL/PLATELET  PROTIME-INR  URINE RAPID DRUG SCREEN (HOSP PERFORMED)  COMPREHENSIVE METABOLIC PANEL   PROTIME-INR  ACETAMINOPHEN LEVEL    Imaging Review Ct Head Wo Contrast  11/25/2014   CLINICAL DATA:  Status post fall the night of 11/24/2014 with a blow to the right side of the head. Vomiting.  EXAM: CT HEAD WITHOUT CONTRAST  TECHNIQUE: Contiguous axial images were obtained from the base of the skull through the vertex without intravenous contrast.  COMPARISON:  Head CT scan 04/07/2014 and brain MRI 03/20/2014.  FINDINGS: The brain appears normal without hemorrhage, infarct, mass lesion, mass effect, midline shift or abnormal extra-axial fluid collection. Small appearing contusion over the frontal bone on the left is noted. No acute fracture is identified. Remote fracture of the lateral wall left orbit is again seen. There is no hydrocephalus or pneumocephalus. Imaged paranasal sinuses and mastoid air cells are clear.  IMPRESSION: Soft tissue contusion over the left frontal bone without underlying fracture or acute intracranial abnormality.  Remote fractures of the lateral wall of the left orbit and left zygomatic arch.   Electronically Signed   By: Inge Rise M.D.   On: 11/25/2014 20:17   Dg Chest Port 1 View  11/25/2014   CLINICAL DATA:  Chest pain today. Migraines. History of alcohol abuse, depression, anxiety, seizures.  EXAM: PORTABLE CHEST - 1 VIEW  COMPARISON:  10/05/2014  FINDINGS: The heart size and mediastinal contours are within normal limits. Both lungs are clear. The visualized skeletal structures are unremarkable.  IMPRESSION: No active disease.   Electronically Signed   By: Lucienne Capers M.D.   On: 11/25/2014 22:16     EKG Interpretation None      MDM   49 year old female with past medical history of anxiety, depression, multiple suicide attempts who presents with suicide attempt via ingestion of # 80 tylenol 500 mg tablets. See HPI above. On arrival, T 97.15F, HR 120, RR 18, BP 131/84, satting 99% on RA. Exam as above, pt overall well-appearing and in NAD. She has mild  tenderness to palpation in the right upper quadrant but abdomen o/w soft, NT, ND. No track marks. Pt alert, oriented, protecting airway. She is not withdrawn but instead agitated, + SI, + suicidal plan.  Pt's history c/f acute tylenol toxicity. Given her chronic liver disease, h/o alcoholism, h/o multiple tylenol overdoses, will start empiric tx with NAC given likely low physiologic reserve and high dose reportedly taken. Will also check CBC, CMP, INR, send broad tox labs and consult Poison Control. Pt o/w HDS, without evidence of fulminant hepatitis or hepatic failure at this time. Pt also here voluntarily - will need to  IVC if attempts to leave but seeking treatment at this time.   CBC with no leukocytosis. CMP with mile AKI with Cr 1.14. AST 165, ALT 212, Bili 1.2, which is actually improved from her prior labs. APAP 53, well above treatment threshold and NAC started at 150 mg/kg IV and high dose infusion treatment, as per Pharmacy and Poison Control, who agrees with tx and admission. INR WNL. Salicylate neg. Given fall, CT Head obtained and is negative for fracture or abnormality. EKG shows narrow QRS, QTc 444, UDS negative without evidence of polysubstance ingestion. Will admit to Medicine for further management of APAP toxicity and suicide attempt.  Pt briefly threatened to leave. Discussed that we would need to IVC her and she expresses understanding and would like voluntary admission at this time. Pt agreeable and re-directable. Sitter at bedside. Pt also endorsing chronic pain, but I am hesitant to treat this in setting of acute liver injury and overdose. Will monitor and d/w pharmacy.  Clinical Impression: 1. Intentional acetaminophen overdose, initial encounter   2. Chest pain   3. Hepatitis   4. AKI (acute kidney injury)     Disposition: Admit  Condition: Stable  Pt seen in conjunction with Dr. Garnette Scheuermann, MD 11/94/17 4081  Delora Fuel, MD 44/81/85 [id number]

## 2014-11-26 NOTE — ED Notes (Signed)
Called materials for scd's.

## 2014-11-27 DIAGNOSIS — T391X2D Poisoning by 4-Aminophenol derivatives, intentional self-harm, subsequent encounter: Secondary | ICD-10-CM

## 2014-11-27 DIAGNOSIS — N170 Acute kidney failure with tubular necrosis: Secondary | ICD-10-CM

## 2014-11-27 DIAGNOSIS — T1491 Suicide attempt: Secondary | ICD-10-CM

## 2014-11-27 LAB — COMPREHENSIVE METABOLIC PANEL
ALK PHOS: 85 U/L (ref 39–117)
ALT: 93 U/L — ABNORMAL HIGH (ref 0–35)
AST: 48 U/L — ABNORMAL HIGH (ref 0–37)
Albumin: 3.1 g/dL — ABNORMAL LOW (ref 3.5–5.2)
Anion gap: 6 (ref 5–15)
BILIRUBIN TOTAL: 0.3 mg/dL (ref 0.3–1.2)
BUN: <5 mg/dL — ABNORMAL LOW (ref 6–23)
CHLORIDE: 113 mmol/L — AB (ref 96–112)
CO2: 21 mmol/L (ref 19–32)
CREATININE: 0.7 mg/dL (ref 0.50–1.10)
Calcium: 8.7 mg/dL (ref 8.4–10.5)
GFR calc non Af Amer: >90 mL/min (ref >90)
Glucose, Bld: 134 mg/dL — ABNORMAL HIGH (ref 70–99)
POTASSIUM: 3.4 mmol/L — AB (ref 3.5–5.1)
Sodium: 140 mmol/L (ref 135–145)
TOTAL PROTEIN: 5.3 g/dL — AB (ref 6.0–8.3)

## 2014-11-27 LAB — PROTIME-INR
INR: 1.01 (ref 0.00–1.49)
Prothrombin Time: 13.4 seconds (ref 11.6–15.2)

## 2014-11-27 LAB — T3, FREE: T3, Free: 2.4 pg/mL (ref 2.0–4.4)

## 2014-11-27 LAB — ACETAMINOPHEN LEVEL

## 2014-11-27 LAB — T4, FREE: Free T4: 1.3 ng/dL (ref 0.80–1.80)

## 2014-11-27 MED ORDER — GABAPENTIN 100 MG PO CAPS
100.0000 mg | ORAL_CAPSULE | Freq: Three times a day (TID) | ORAL | Status: DC
  Administered 2014-11-27 (×3): 100 mg via ORAL
  Filled 2014-11-27 (×7): qty 1

## 2014-11-27 MED ORDER — POTASSIUM CHLORIDE CRYS ER 20 MEQ PO TBCR: 40.0000 meq | EXTENDED_RELEASE_TABLET | Freq: Once | ORAL | Status: DC

## 2014-11-27 NOTE — Progress Notes (Signed)
NURSING PROGRESS NOTE  [name] [id number] Admission Data: 11/27/2014 12:59 PM Attending Provider: Domenic Polite, MD PCP:No PCP Per Patient Code Status: Full  Allergies:  Aspirin; Doxycycline; Imitrex; Iodinated diagnostic agents; Lidocaine; Prednisone; Sulfa antibiotics; Sulfasalazine; Sumatriptan; Tramadol; Levofloxacin; Latex; Levofloxacin; Metrizamide; Nsaids; and Tolmetin Past Medical History:   has a past medical history of Migraines; Endometriosis; ETOH abuse; Anorexia; Depression; Anxiety; DJD (degenerative joint disease) of cervical spine; Seizures; and PICC (peripherally inserted central catheter) in place. Past Surgical History:   has past surgical history that includes Knee surgery; Abdominal surgery; Cholecystectomy; Appendectomy; Abdominal hysterectomy; Nasal sinus surgery; laproscopy; Carpal tunnel release; and Esophagogastroduodenoscopy (N/A, 10/06/2014). Social History:   reports that she has been smoking Cigarettes.  She has a 5 pack-year smoking history. She has never used smokeless tobacco. She reports that she does not drink alcohol or use illicit drugs.  [name] is a 49 y.o. female patient admitted from ED:   Last Documented Vital Signs: Blood pressure 126/88, pulse 96, temperature 98.1 F (36.7 C), temperature source Oral, resp. rate 18, height 5\' 1"  (1.549 m), weight 55.4 kg (122 lb 2.2 oz), SpO2 100 %.  Cardiac Monitoring: Box # 14 in place. Cardiac monitor yields:sinus tachycardia.  IV Fluids:  IV in place, occlusive dsg intact without redness, IV cath forearm right, condition patent and no redness none.   Skin: WDL  Patient/Family orientated to room. Information packet given to patient/family. Admission inpatient armband information verified with patient/family to include name and date of birth and placed on patient arm. Side rails up x 2, fall assessment and education completed with patient/family. Patient/family able to verbalize understanding of risk  associated with falls and verbalized understanding to call for assistance before getting out of bed. Call light within reach. Patient/family able to voice and demonstrate understanding of unit orientation instructions.    Will continue to evaluate and treat per MD orders.   Hendricks Limes RN, BS, BSN

## 2014-11-27 NOTE — Progress Notes (Signed)
[facility] PROGRESS NOTE  Tacoya Altizer HOZ:[id number] DOB: 1965-11-11 DOA: 11/25/2014 PCP: No PCP Per Patient  Assessment/Plan: 1. Tylenol overdose: Suicide attempt, multiple previous attempts with tylenol Tylenol level <10 Mucomyst infusion completed at 1am LFTS trending down, tolerating diet -Psych consult pending -medically stable for [facility] to [facility] if deemed appropriate per Psych -continue 1:1 sitter  Hypokalemia replete   Acute renal failure: probably from volume depletion from dehydration, resolved with IV fluids.   Elevated liver function tests: Probably from the use of tylenol. Improving.   Abnormal UA: She denies any dysuria symptoms. Urine cultures sent.   Low TSH: Free t4 and free t3 normal  Chronic pain  -resume gabapentin -needs PCP to manage this  -will not start narcotics given long h/o ODs   Code Status: full code Family Communication: sitter at bedside Disposition Plan: tx to floor/[facility] pending psych eval   Consultants:  psychaitry  Procedures:  none  Antibiotics:  none  HPI/Subjective: Feels ok, vomited x1 yesterday. Denies being suicidal now  Objective: Filed Vitals:   11/27/14 0700  BP:   Pulse: 86  Temp:   Resp: 11    Intake/Output Summary (Last 24 hours) at 11/27/14 0802 Last data filed at 11/27/14 0100  Gross per 24 hour  Intake 2264.8 ml  Output      0 ml  Net 2264.8 ml   Filed Weights   11/25/14 2026 11/26/14 0600  Weight: 54.9 kg (121 lb 0.5 oz) 55.4 kg (122 lb 2.2 oz)    Exam:   General:  Alert afebrile in mild distress from headache.   Cardiovascular: s1s2, no mrg, RRR  Respiratory: clear to ausculation, no wheezing or rhonchi  Abdomen: soft non tender non distended bowel sounds heard.   Musculoskeletal: no pedal edema.   Data Reviewed: Basic Metabolic Panel:  Recent Labs Lab 11/25/14 1950 11/26/14 0525 11/26/14 1950 11/27/14 0356  NA 137 139 143 140  K 3.5 3.0* 3.9 3.4*  CL 103 107 111 113*   CO2 20 22 22 21   GLUCOSE 74 71 104* 134*  BUN 21 14 7  <5*  CREATININE 1.14* 0.88 0.91 0.70  CALCIUM 10.2 8.9 9.2 8.7   Liver Function Tests:  Recent Labs Lab 11/25/14 1950 11/26/14 0525 11/26/14 1950 11/27/14 0356  AST 165* 91* 76* 48*  ALT 212* 146* 115* 93*  ALKPHOS 99 74 86 85  BILITOT 1.2 0.9 0.8 0.3  PROT 7.6 5.7* 5.6* 5.3*  ALBUMIN 4.6 3.4* 3.3* 3.1*   No results for input(s): LIPASE, AMYLASE in the last 168 hours. No results for input(s): AMMONIA in the last 168 hours. CBC:  Recent Labs Lab 11/25/14 1950 11/26/14 0645  WBC 9.4 7.5  NEUTROABS 5.9 3.4  HGB 14.2 12.0  HCT 40.7 34.3*  MCV 92.7 92.0  PLT 313 268   Cardiac Enzymes:  Recent Labs Lab 11/26/14 0525  CKTOTAL 45  TROPONINI <0.03   BNP (last 3 results) No results for input(s): BNP in the last 8760 hours.  ProBNP (last 3 results) No results for input(s): PROBNP in the last 8760 hours.  CBG: No results for input(s): GLUCAP in the last 168 hours.  No results found for this or any previous visit (from the past 240 hour(s)).   Studies: Ct Head Wo Contrast  11/25/2014   CLINICAL DATA:  Status post fall the night of 11/24/2014 with a blow to the right side of the head. Vomiting.  EXAM: CT HEAD WITHOUT CONTRAST  TECHNIQUE: Contiguous axial images were  obtained from the base of the skull through the vertex without intravenous contrast.  COMPARISON:  Head CT scan 04/07/2014 and brain MRI 03/20/2014.  FINDINGS: The brain appears normal without hemorrhage, infarct, mass lesion, mass effect, midline shift or abnormal extra-axial fluid collection. Small appearing contusion over the frontal bone on the left is noted. No acute fracture is identified. Remote fracture of the lateral wall left orbit is again seen. There is no hydrocephalus or pneumocephalus. Imaged paranasal sinuses and mastoid air cells are clear.  IMPRESSION: Soft tissue contusion over the left frontal bone without underlying fracture or acute  intracranial abnormality.  Remote fractures of the lateral wall of the left orbit and left zygomatic arch.   Electronically Signed   By: Inge Rise M.D.   On: 11/25/2014 20:17   Dg Chest Port 1 View  11/25/2014   CLINICAL DATA:  Chest pain today. Migraines. History of alcohol abuse, depression, anxiety, seizures.  EXAM: PORTABLE CHEST - 1 VIEW  COMPARISON:  10/05/2014  FINDINGS: The heart size and mediastinal contours are within normal limits. Both lungs are clear. The visualized skeletal structures are unremarkable.  IMPRESSION: No active disease.   Electronically Signed   By: Lucienne Capers M.D.   On: 11/25/2014 22:16    Scheduled Meds: . busPIRone  7.5 mg Oral BID  . carisoprodol  350 mg Oral BID  . FLUoxetine  20 mg Oral Daily  . levETIRAcetam  500 mg Oral BID  . pantoprazole  40 mg Oral BID  . potassium chloride  30 mEq Oral Once   Continuous Infusions: . acetylcysteine Stopped (11/27/14 0100)    Principal Problem:   Intentional acetaminophen overdose Active Problems:   Tylenol overdose   Major depression   Seizure disorder   Suicidal ideation   ARF (acute renal failure)    Time spent: 35 minutes   Raveena Hebdon  [facility] Pager [phone]  If 7PM-7AM, please contact night-coverage at [email or web address], password [facility] 11/27/2014, 8:02 AM  LOS: 2 days

## 2014-11-27 NOTE — Consult Note (Signed)
[place] Psychiatry Consult   Reason for Consult:  Intentional tylenol overdose and history of bipolar/substance abuse Referring Physician:  Dr. Broadus John Patient Identification: [name] MRN:  [id number] Principal Diagnosis: Intentional acetaminophen overdose Diagnosis:   Patient Active Problem List   Diagnosis Date Noted  . ARF (acute renal failure) [N17.9] 11/25/2014  . Intentional acetaminophen overdose [T39.1X2A] 10/26/2014  . Major depressive disorder, recurrent, severe without psychotic features [F33.2]   . Insomnia [G47.00]   . Chronic pain syndrome [G89.4]   . Bipolar 1 disorder, mixed, severe [F31.63] 10/09/2014  . Acute blood loss anemia [D62] 10/07/2014  . Liver failure, acute [K72.00]   . GI bleed [K92.2] 10/06/2014  . Duodenal ulcer hemorrhage [K26.4] 10/06/2014  . Vomiting [R11.10] 10/05/2014  . Acute hepatitis [K72.00] 10/05/2014  . Acute renal insufficiency [N28.9] 10/05/2014  . Seizure disorder [G40.909] 10/05/2014  . Hyperammonemia [E72.20] 10/05/2014  . Suicidal ideation [R45.851] 10/05/2014  . Bipolar disorder [F31.9] 10/01/2014  . Major depression [F32.2] 10/01/2014  . Nausea and vomiting [R11.2] 09/30/2014  . Diarrhea [R19.7] 09/30/2014  . SIRS (systemic inflammatory response syndrome) [A41.9] 07/15/2014  . Tylenol overdose [T39.1X4A] 07/13/2014  . Hypokalemia [E87.6] 07/13/2014  . Depression [F32.9] 05/25/2012    Total Time spent with patient: 45 minutes  Subjective:   [name] is a 49 y.o. female patient admitted with tylenol overdose.  HPI:  [name] is a 49 y.o. Female seen, chart reviewed for psychiatric consultation and evaluation of intentional overdose of Tylenol and depression. Patient is known to this provider from her previous similar clinical presentation during the month of January 2016. Patient reported she went home after discharge from the behavioral [facility] and found herself miserable due to lack of utilities and  financial support to fix it. 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 Patient reported she has been suffering with bulimia, anorexia, depression, panic episodes and stopped taking Neurontin. Patient felt depressed and to current Tylenol extra strength 100 pills between 3 AM 2 1 PM on Monday. Patient sister who failed to contact on phone called the neighbor to check on her. Reportedly patient neighbor brought her to the emergency department. Patient is still minimal fluid levels are less than 10 which indicates she may not have taken as many tablets as she mentioned during my interview. 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 Information obtained from the patient is not really able or dependable at this time so we will contact family members for collateral information. Patient is also reported she was admitted to [facility] seeing Dr. Lissa Hoard packs who provided ECT treatments which need to be conformed at this time. Patient denies she has no current suicidal, homicidal ideation, intentions or plans. Patient has no evidence of psychosis. Patient seems to have cluster B personality traits. Patient reported her home medications are BuSpar 7.5 mg twice daily, Soma 350 mg twice daily, Prozac 20 mg daily, Keppra 500 mg twice daily, Protonix 40 mg twice daily and potassium supplementation and Neurontin 100 mg 3 times daily. Patient reported she stopped taking her Neurontin for unknown reasons.  Medical history: Patient with history of depression, seizure, chronic pain who was admitted last month for Tylenol overdose was brought to the ER after  patient had taken intentionally overdose of Tylenol since today morning. Patient states she has been having chronic pain and she was depressed  and suicidal and started taking Tylenol intentionally to commit suicide. Patient has taken multiple doses of Tylenol. She also has been having nausea vomiting since morning. She also had fallen and hit her head when she was walking and tripped on her dog on Saturday but did not seek medical attention. In the ER CT head shows some contusion. Tylenol level was elevated with elevated LFTs. Poison control was contacted and patient has been started on IV Mucomyst. Patient has been placed on suicide precautions. Patient also has been complaining of some chest pain. Chest pain happens only on deep breathing. Denies any productive cough fever chills.   Review of Systems: As presented in the history of presenting illness, rest negative.  HPI Elements:   Location:  Depression, anxiety and eating disorder. Quality:  Poor. Severity:  Become chronic. Timing:  Psychosocial stresses. Duration:  Few weeks. Context:  Multiple psychosocial stressors..  Past Medical History:  Past Medical History  Diagnosis Date  . Migraines   . Endometriosis   . ETOH abuse     sober 3 1/2 years  . Anorexia   . Depression   . Anxiety   . DJD (degenerative joint disease) of cervical spine   . Seizures   . PICC (peripherally inserted central catheter) in place     rt neck    Past Surgical History  Procedure Laterality Date  . Knee surgery    . Abdominal surgery    . Cholecystectomy    . Appendectomy    . Abdominal hysterectomy    . Nasal sinus surgery    . Laproscopy    . Carpal tunnel release      2010  . Esophagogastroduodenoscopy N/A 10/06/2014    Procedure: ESOPHAGOGASTRODUODENOSCOPY (EGD);  Surgeon: Inda Castle, MD;  Location: [place];  Service: Endoscopy;  Laterality: N/A;   Family History:  Family History  Problem Relation Age of Onset  . Hypertension Mother   . Hyperlipidemia Mother   . Heart failure Father   . Cancer Other    Social History:  History  Alcohol Use No     History  Drug  Use No    History   Social History  . Marital Status: Single    Spouse Name: N/A  . Number of Children: N/A  . Years of Education: N/A   Social History Main Topics  . Smoking status: Current Every Day Smoker -- 0.25 packs/day for 20 years    Types: Cigarettes  . Smokeless tobacco: Never Used  . Alcohol Use: No  . Drug Use: No  . Sexual Activity: Not on file   Other Topics Concern  . None   Social History Narrative   Additional Social History:                          Allergies:   Allergies  Allergen Reactions  . Aspirin Anaphylaxis  . Doxycycline Anaphylaxis and Other (See Comments)    Joint damage also  . Imitrex [Sumatriptan Base] Other (See Comments)    Severe hypertension  . Iodinated Diagnostic Agents Anaphylaxis  . Lidocaine Anaphylaxis    Pt states she does well with Marcaine/Bupivicaine without problem  . Prednisone Other (See Comments)    She goes crazy  . Sulfa Antibiotics Anaphylaxis  . Sulfasalazine Anaphylaxis  . Sumatriptan Other (See Comments)  Felt like she was "having a stroke", heart rate and blood pressure "went through the roof"  . Tramadol Other (See Comments)    seizures  . Levofloxacin Other (See Comments)    Joints hurt  . Latex Rash  . Levofloxacin Rash and Other (See Comments)    Joints hurt  . Metrizamide Other (See Comments)  . Nsaids Rash and Other (See Comments)    GI bleed  . Tolmetin Rash    Vitals: Blood pressure 127/103, pulse 86, temperature 97.7 F (36.5 C), temperature source Axillary, resp. rate 11, height 5\' 1"  (1.549 m), weight 55.4 kg (122 lb 2.2 oz), SpO2 98 %.  Risk to Self: Is patient at risk for suicide?: Yes Risk to Others:   Prior Inpatient Therapy:   Prior Outpatient Therapy:    Current Facility-Administered Medications  Medication Dose Route Frequency Provider Last Rate Last Dose  . acetylcysteine (ACETADOTE) 30,000 mg in dextrose 5 % 750 mL (40 mg/mL) infusion  15 mg/kg/hr Intravenous  Continuous Valeda Malm Rumbarger, RPH   Stopped at 11/27/14 0100  . busPIRone (BUSPAR) tablet 7.5 mg  7.5 mg Oral BID Rise Patience, MD   7.5 mg at 11/27/14 3086  . carisoprodol (SOMA) tablet 350 mg  350 mg Oral BID Rise Patience, MD   350 mg at 11/27/14 5784  . clonazePAM (KLONOPIN) tablet 1 mg  1 mg Oral TID PRN Rise Patience, MD   1 mg at 11/27/14 0719  . FLUoxetine (PROZAC) capsule 20 mg  20 mg Oral Daily Rise Patience, MD   20 mg at 11/27/14 6962  . gabapentin (NEURONTIN) capsule 100 mg  100 mg Oral TID Domenic Polite, MD   100 mg at 11/27/14 9528  . levETIRAcetam (KEPPRA) tablet 500 mg  500 mg Oral BID Rise Patience, MD   500 mg at 11/27/14 4132  . ondansetron (ZOFRAN) tablet 4 mg  4 mg Oral Q6H PRN Rise Patience, MD       Or  . ondansetron [facility]) injection 4 mg  4 mg Intravenous Q6H PRN Rise Patience, MD   4 mg at 11/26/14 1624  . pantoprazole (PROTONIX) EC tablet 40 mg  40 mg Oral BID Rise Patience, MD   40 mg at 11/27/14 4401  . potassium chloride (K-DUR,KLOR-CON) CR tablet 30 mEq  30 mEq Oral Once Gardiner Barefoot, NP   30 mEq at 11/26/14 0730    Musculoskeletal: Strength & Muscle Tone: decreased Gait & Station: unable to stand Patient leans: N/A  Psychiatric Specialty Exam: Physical Exam as per history and physical   ROS intentional overdose of Tylenol secondary to ongoing eating disorder, depression, anxiety and multiple psychosocial stresses   Blood pressure 127/103, pulse 86, temperature 97.7 F (36.5 C), temperature source Axillary, resp. rate 11, height 5\' 1"  (1.549 m), weight 55.4 kg (122 lb 2.2 oz), SpO2 98 %.Body mass index is 23.09 kg/(m^2).  General Appearance: Guarded  Eye Contact::  Good  Speech:  Clear and Coherent  Volume:  Decreased  Mood:  Anxious, Depressed and Worthless  Affect:  Constricted  Thought Process:  Coherent and Goal Directed  Orientation:  Full (Time, Place, and Person)  Thought Content:   Rumination  Suicidal Thoughts:  No  Homicidal Thoughts:  No  Memory:  Immediate;   Fair Recent;   Fair  Judgement:  Impaired  Insight:  Lacking  Psychomotor Activity:  Decreased  Concentration:  Fair  Recall:  [employer] of  Knowledge:Fair  Language: Good  Akathisia:  Negative  Handed:  Right  AIMS (if indicated):     Assets:  Communication Skills Desire for Improvement Housing Leisure Time Resilience Social Support  ADL's:  Impaired  Cognition: Impaired,  Mild  Sleep:      Medical Decision Making: New problem, with additional work up planned, Review of Psycho-Social Stressors (1), Review or order clinical lab tests (1), Established Problem, Worsening (2), Review or order medicine tests (1), Review of Medication Regimen & Side Effects (2) and Review of New Medication or Change in Dosage (2)  Treatment Plan Summary: Daily contact with patient to assess and evaluate symptoms and progress in treatment and Medication management  Plan:  Recommend psychiatric Inpatient admission when medically cleared. Supportive therapy provided about ongoing stressors.   Disposition: Patient may need acute psychiatric hospitalization secondary to intentional Tylenol overdose as a suicidal attempt and also need medication management for eating disorder, depression, and anxiety.   [name],JANARDHAHA R. 11/27/2014 10:00 AM

## 2014-11-28 ENCOUNTER — Inpatient Hospital Stay (HOSPITAL_COMMUNITY)
Admission: AD | Admit: 2014-11-28 | Discharge: 2014-12-11 | DRG: 885 | Disposition: A | Discharge status: Other Acute Inpatient Hospital | Payer: Federal, State, Local not specified - Other | Source: Intra-hospital | Attending: Emergency Medicine | Admitting: Emergency Medicine

## 2014-11-28 ENCOUNTER — Encounter (HOSPITAL_COMMUNITY): Payer: Self-pay | Admitting: *Deleted

## 2014-11-28 DIAGNOSIS — Z885 Allergy status to narcotic agent status: Secondary | ICD-10-CM | POA: Diagnosis not present

## 2014-11-28 DIAGNOSIS — R569 Unspecified convulsions: Secondary | ICD-10-CM | POA: Diagnosis not present

## 2014-11-28 DIAGNOSIS — F329 Major depressive disorder, single episode, unspecified: Secondary | ICD-10-CM | POA: Diagnosis present

## 2014-11-28 DIAGNOSIS — Z886 Allergy status to analgesic agent status: Secondary | ICD-10-CM | POA: Diagnosis not present

## 2014-11-28 DIAGNOSIS — M47812 Spondylosis without myelopathy or radiculopathy, cervical region: Secondary | ICD-10-CM | POA: Diagnosis not present

## 2014-11-28 DIAGNOSIS — G894 Chronic pain syndrome: Secondary | ICD-10-CM | POA: Diagnosis present

## 2014-11-28 DIAGNOSIS — Z882 Allergy status to sulfonamides status: Secondary | ICD-10-CM | POA: Diagnosis not present

## 2014-11-28 DIAGNOSIS — F411 Generalized anxiety disorder: Secondary | ICD-10-CM | POA: Diagnosis present

## 2014-11-28 DIAGNOSIS — G4700 Insomnia, unspecified: Secondary | ICD-10-CM | POA: Diagnosis present

## 2014-11-28 DIAGNOSIS — F4000 Agoraphobia, unspecified: Secondary | ICD-10-CM | POA: Diagnosis present

## 2014-11-28 DIAGNOSIS — F4310 Post-traumatic stress disorder, unspecified: Secondary | ICD-10-CM | POA: Diagnosis present

## 2014-11-28 DIAGNOSIS — F319 Bipolar disorder, unspecified: Secondary | ICD-10-CM | POA: Diagnosis not present

## 2014-11-28 DIAGNOSIS — F332 Major depressive disorder, recurrent severe without psychotic features: Secondary | ICD-10-CM | POA: Diagnosis present

## 2014-11-28 DIAGNOSIS — G40409 Other generalized epilepsy and epileptic syndromes, not intractable, without status epilepticus: Secondary | ICD-10-CM | POA: Diagnosis not present

## 2014-11-28 DIAGNOSIS — F3130 Bipolar disorder, current episode depressed, mild or moderate severity, unspecified: Secondary | ICD-10-CM | POA: Diagnosis not present

## 2014-11-28 DIAGNOSIS — F410 Panic disorder [episodic paroxysmal anxiety] without agoraphobia: Secondary | ICD-10-CM | POA: Diagnosis present

## 2014-11-28 DIAGNOSIS — I10 Essential (primary) hypertension: Secondary | ICD-10-CM | POA: Diagnosis not present

## 2014-11-28 DIAGNOSIS — Z8249 Family history of ischemic heart disease and other diseases of the circulatory system: Secondary | ICD-10-CM

## 2014-11-28 DIAGNOSIS — Z880 Allergy status to penicillin: Secondary | ICD-10-CM | POA: Diagnosis not present

## 2014-11-28 DIAGNOSIS — K219 Gastro-esophageal reflux disease without esophagitis: Secondary | ICD-10-CM | POA: Diagnosis not present

## 2014-11-28 DIAGNOSIS — Z90710 Acquired absence of both cervix and uterus: Secondary | ICD-10-CM | POA: Diagnosis not present

## 2014-11-28 DIAGNOSIS — M1990 Unspecified osteoarthritis, unspecified site: Secondary | ICD-10-CM | POA: Diagnosis present

## 2014-11-28 DIAGNOSIS — F419 Anxiety disorder, unspecified: Secondary | ICD-10-CM | POA: Diagnosis not present

## 2014-11-28 DIAGNOSIS — F17210 Nicotine dependence, cigarettes, uncomplicated: Secondary | ICD-10-CM | POA: Diagnosis present

## 2014-11-28 DIAGNOSIS — R45851 Suicidal ideations: Secondary | ICD-10-CM | POA: Diagnosis present

## 2014-11-28 DIAGNOSIS — F321 Major depressive disorder, single episode, moderate: Secondary | ICD-10-CM

## 2014-11-28 DIAGNOSIS — F3163 Bipolar disorder, current episode mixed, severe, without psychotic features: Secondary | ICD-10-CM | POA: Diagnosis not present

## 2014-11-28 DIAGNOSIS — T71162D Asphyxiation due to hanging, intentional self-harm, subsequent encounter: Secondary | ICD-10-CM | POA: Diagnosis not present

## 2014-11-28 DIAGNOSIS — G43909 Migraine, unspecified, not intractable, without status migrainosus: Secondary | ICD-10-CM | POA: Diagnosis not present

## 2014-11-28 LAB — COMPREHENSIVE METABOLIC PANEL
ALT: 71 U/L — ABNORMAL HIGH (ref 0–35)
AST: 32 U/L (ref 0–37)
Albumin: 3.3 g/dL — ABNORMAL LOW (ref 3.5–5.2)
Alkaline Phosphatase: 98 U/L (ref 39–117)
Anion gap: 7 (ref 5–15)
BUN: 6 mg/dL (ref 6–23)
CO2: 31 mmol/L (ref 19–32)
Calcium: 9.1 mg/dL (ref 8.4–10.5)
Chloride: 102 mmol/L (ref 96–112)
Creatinine, Ser: 0.6 mg/dL (ref 0.50–1.10)
GFR calc Af Amer: >90 mL/min (ref >90)
GFR calc non Af Amer: >90 mL/min (ref >90)
GLUCOSE: 94 mg/dL (ref 70–99)
POTASSIUM: 3.5 mmol/L (ref 3.5–5.1)
Sodium: 140 mmol/L (ref 135–145)
TOTAL PROTEIN: 5.8 g/dL — AB (ref 6.0–8.3)
Total Bilirubin: 0.3 mg/dL (ref 0.3–1.2)

## 2014-11-28 LAB — URINE CULTURE
COLONY COUNT: NO GROWTH
Culture: NO GROWTH

## 2014-11-28 MED ORDER — PANTOPRAZOLE SODIUM 40 MG PO TBEC
40.0000 mg | DELAYED_RELEASE_TABLET | Freq: Two times a day (BID) | ORAL | Status: DC
  Administered 2014-11-28 – 2014-12-10 (×24): 40 mg via ORAL
  Filled 2014-11-28 (×38): qty 1

## 2014-11-28 MED ORDER — TRAZODONE HCL 50 MG PO TABS
50.0000 mg | ORAL_TABLET | Freq: Every evening | ORAL | Status: DC | PRN
Start: 2014-11-28 — End: 2014-12-02

## 2014-11-28 MED ORDER — MAGNESIUM HYDROXIDE 400 MG/5ML PO SUSP: 30.0000 mL | Freq: Every day | ORAL | Status: DC | PRN

## 2014-11-28 MED ORDER — GABAPENTIN 100 MG PO CAPS
100.0000 mg | ORAL_CAPSULE | Freq: Three times a day (TID) | ORAL | Status: DC
  Filled 2014-11-28 (×10): qty 1

## 2014-11-28 MED ORDER — ADULT MULTIVITAMIN W/MINERALS CH
1.0000 | ORAL_TABLET | Freq: Every day | ORAL | Status: DC
  Administered 2014-11-29 – 2014-12-10 (×12): 1 via ORAL
  Filled 2014-11-28 (×19): qty 1

## 2014-11-28 MED ORDER — LEVETIRACETAM 500 MG PO TABS
500.0000 mg | ORAL_TABLET | Freq: Two times a day (BID) | ORAL | Status: DC
  Administered 2014-11-28 – 2014-12-10 (×24): 500 mg via ORAL
  Filled 2014-11-28 (×16): qty 1
  Filled 2014-11-28: qty 2
  Filled 2014-11-28 (×20): qty 1

## 2014-11-28 MED ORDER — BUSPIRONE HCL 15 MG PO TABS
7.5000 mg | ORAL_TABLET | Freq: Two times a day (BID) | ORAL | Status: DC
  Administered 2014-11-28 – 2014-11-29 (×2): 7.5 mg via ORAL
  Filled 2014-11-28 (×7): qty 1

## 2014-11-28 MED ORDER — ALUM & MAG HYDROXIDE-SIMETH 200-200-20 MG/5ML PO SUSP: 30.0000 mL | ORAL | Status: DC | PRN

## 2014-11-28 MED ORDER — FLUOXETINE HCL 20 MG PO CAPS
20.0000 mg | ORAL_CAPSULE | Freq: Every day | ORAL | Status: DC
  Administered 2014-11-29: 20 mg via ORAL
  Filled 2014-11-28 (×4): qty 1

## 2014-11-28 MED ORDER — CARISOPRODOL 350 MG PO TABS
350.0000 mg | ORAL_TABLET | Freq: Once | ORAL | Status: AC
  Administered 2014-11-28: 350 mg via ORAL
  Filled 2014-11-28: qty 1

## 2014-11-28 MED ORDER — SALINE SPRAY 0.65 % NA SOLN
1.0000 | NASAL | Status: DC | PRN
  Filled 2014-11-28: qty 44

## 2014-11-28 NOTE — Progress Notes (Signed)
Nursing admission note:  Patient is a 49 yo female admitted to [facility] due to tylenol overdose.  Patient was here in January also for tylenol overdose.  Patient states she is depressed because of her chronic pain issues.  She reports hx of DDD and migraines.  Poison control was notified and patient was started on IV mucomyst.  Patient also stated she tripped over her dog and hit her head.  Patient was made a high fall risk due to the recent fall.  Patient c/o severe depression, hopelessness, insomnia and crying spells.  She has hx of etoh abuse and has been sober for 3.5 years.  She denies any drug abuse.  Patient states she has a good support system; a sister, her sponsor, her sponsees and a neighbor.  Patient has a hx of seizures and takes keppra for same.  Patient was upset during admission because her wallet was missing from her purse.  Asst. Director at [facility] is investigating and will contact the unit with any information.  Patient reports active SI, denies HI and AVH.  Patient verbally contracts for safety on the unit.  She was oriented to room and unit.

## 2014-11-28 NOTE — Clinical Social Work Psych Note (Addendum)
Patient has been accepted to [facility].  Bed assignment is 406-1.  RN to call report to: [phone].  Transportation: [place]y Pries [phone].  Patient is voluntary and has signed the voluntary form.  Form faxed to [facility],  RN and patient aware and agreeable.  Nonnie Done, [place] [phone]  Psychiatric & Orthopedics (5N 1-16) Clinical Social Worker

## 2014-11-28 NOTE — Tx Team (Signed)
Initial Interdisciplinary Treatment Plan   PATIENT STRESSORS: Financial difficulties Health problems Medication change or noncompliance   PATIENT STRENGTHS: Average or above average intelligence Capable of independent living Supportive family/friends   PROBLEM LIST: Problem List/Patient Goals Date to be addressed Date deferred Reason deferred Estimated date of resolution  "I want to get my joy back" 11/28/2014     "need resources. Find out how to get disability." 11/28/2014     Depression 11/28/2014      Suicidal Ideation 11/28/2014     Overdose by tylenol X2 11/28/2014                              DISCHARGE CRITERIA:  Improved stabilization in mood, thinking, and/or behavior Need for constant or close observation no longer present Reduction of life-threatening or endangering symptoms to within safe limits Verbal commitment to aftercare and medication compliance  PRELIMINARY DISCHARGE PLAN: Outpatient therapy Return to previous living arrangement Return to previous work or school arrangements  PATIENT/FAMIILY INVOLVEMENT: This treatment plan has been presented to and reviewed with the patient, [name].  The patient and family have been given the opportunity to ask questions and make suggestions.  Zipporah Plants 11/28/2014, 5:46 PM

## 2014-11-28 NOTE — Progress Notes (Signed)
Patient ID: [name], female   DOB: 1966/04/05, 49 y.o.   MRN: [id number] Patient signed 72-hour discharge 3.10.2016 @ 1825.

## 2014-11-28 NOTE — Discharge Summary (Signed)
Physician Discharge Summary  [name] KZS:[id number] DOB: 12/09/1965 DOA: 11/25/2014  PCP: No PCP Per Patient  Admit date: 11/25/2014 Discharge date: 11/28/2014  Time spent: 45 minutes  Recommendations for Outpatient Follow-up:  1. Psychiatry as outpatient  Discharge Diagnoses:  Principal Problem:   Intentional acetaminophen overdose Active Problems:   Tylenol overdose   Major depression   Seizure disorder   Suicidal ideation   ARF (acute renal failure)   Discharge Condition: stable  Diet recommendation: regular  Filed Weights   11/25/14 2026 11/26/14 0600  Weight: 54.9 kg (121 lb 0.5 oz) 55.4 kg (122 lb 2.2 oz)    History of present illness:  [name] is a 49 y.o. female with history of depression, seizure, chronic pain who was admitted last month for Tylenol overdose was brought to the ER after patient had taken intentionally overdose of Tylenol 3/7 am. Patient stated she was having chronic pain and she was depressed and suicidal and started taking Tylenol intentionally to commit suicide.   Hospital Course:  1. Tylenol overdose: Suicide attempt, multiple previous attempts with tylenol -was started on mucomist per Protocol, tylenol level was low on normogram and hence Mucomist stopped after 24hours after discussion with Poison control. -RepeatTylenol level <10 -LFTS trending down, tolerating diet -Psych consult completed, inpatient psych recommended -medically stable for DC to [facility]  2. Hypokalemia repleted  3. Acute renal failure: probably from volume depletion from dehydration, resolved with IV fluids.   4. Low TSH: Free t4 and free t3 normal  5. Chronic pain -resume gabapentin -needs PCP to manage this  -will not start narcotics given long h/o ODs -needs pCP and Psych to manage her chronic pain   Consultations: Psychiatry Discharge Exam: Filed Vitals:   11/28/14 0512  BP: 109/76  Pulse:   Temp: 97.8 F (36.6 C)  Resp: 18    General:  AAOx3 Cardiovascular: S1S2/RRR Respiratory: CTAB  Discharge Instructions   Discharge Instructions    Diet general    Complete by:  As directed      Increase activity slowly    Complete by:  As directed           Current Discharge Medication List    CONTINUE these medications which have NOT CHANGED   Details  busPIRone (BUSPAR) 7.5 MG tablet Take 1 tablet (7.5 mg total) by mouth 2 (two) times daily. Qty: 60 tablet, Refills: 0    Calcium Carbonate-Vitamin D (CALCIUM-VITAMIN D3 PO) Take 1 capsule by mouth daily.    carisoprodol (SOMA) 350 MG tablet Take 1 tablet (350 mg total) by mouth at bedtime. Qty: 30 tablet, Refills: 0    clonazePAM (KLONOPIN) 1 MG tablet Take 1 tablet (1 mg total) by mouth 3 (three) times daily as needed (anxiety). Qty: 12 tablet, Refills: 0    diphenhydrAMINE (BENADRYL) 25 MG tablet Take 25 mg by mouth every 6 (six) hours as needed for itching or allergies.    FLUoxetine (PROZAC) 20 MG capsule Take 1 capsule (20 mg total) by mouth daily. Qty: 30 capsule, Refills: 0    levETIRAcetam (KEPPRA) 500 MG tablet Take 1 tablet (500 mg total) by mouth 2 (two) times daily. Qty: 60 tablet, Refills: 0    Multiple Vitamin (MULTIVITAMIN WITH MINERALS) TABS tablet Take 1 tablet by mouth daily.    pantoprazole (PROTONIX) 40 MG tablet Take 1 tablet (40 mg total) by mouth 2 (two) times daily. Qty: 60 tablet, Refills: 0    gabapentin (NEURONTIN) 100 MG capsule Take 1 capsule (  100 mg total) by mouth 3 (three) times daily. Qty: 90 capsule, Refills: 0       Allergies  Allergen Reactions  . Aspirin Anaphylaxis  . Doxycycline Anaphylaxis and Other (See Comments)    Joint damage also  . Imitrex [Sumatriptan Base] Other (See Comments)    Severe hypertension  . Iodinated Diagnostic Agents Anaphylaxis  . Lidocaine Anaphylaxis    Pt states she does well with Marcaine/Bupivicaine without problem  . Prednisone Other (See Comments)    She goes crazy  . Sulfa  Antibiotics Anaphylaxis  . Sulfasalazine Anaphylaxis  . Sumatriptan Other (See Comments)    Felt like she was "having a stroke", heart rate and blood pressure "went through the roof"  . Tramadol Other (See Comments)    seizures  . Levofloxacin Other (See Comments)    Joints hurt  . Latex Rash  . Levofloxacin Rash and Other (See Comments)    Joints hurt  . Metrizamide Other (See Comments)  . Nsaids Rash and Other (See Comments)    GI bleed  . Tolmetin Rash      The results of significant diagnostics from this hospitalization (including imaging, microbiology, ancillary and laboratory) are listed below for reference.    Significant Diagnostic Studies: Ct Head Wo Contrast  11/25/2014   CLINICAL DATA:  Status post fall the night of 11/24/2014 with a blow to the right side of the head. Vomiting.  EXAM: CT HEAD WITHOUT CONTRAST  TECHNIQUE: Contiguous axial images were obtained from the base of the skull through the vertex without intravenous contrast.  COMPARISON:  Head CT scan 04/07/2014 and brain MRI 03/20/2014.  FINDINGS: The brain appears normal without hemorrhage, infarct, mass lesion, mass effect, midline shift or abnormal extra-axial fluid collection. Small appearing contusion over the frontal bone on the left is noted. No acute fracture is identified. Remote fracture of the lateral wall left orbit is again seen. There is no hydrocephalus or pneumocephalus. Imaged paranasal sinuses and mastoid air cells are clear.  IMPRESSION: Soft tissue contusion over the left frontal bone without underlying fracture or acute intracranial abnormality.  Remote fractures of the lateral wall of the left orbit and left zygomatic arch.   Electronically Signed   By: Inge Rise M.D.   On: 11/25/2014 20:17   Dg Chest Port 1 View  11/25/2014   CLINICAL DATA:  Chest pain today. Migraines. History of alcohol abuse, depression, anxiety, seizures.  EXAM: PORTABLE CHEST - 1 VIEW  COMPARISON:  10/05/2014  FINDINGS:  The heart size and mediastinal contours are within normal limits. Both lungs are clear. The visualized skeletal structures are unremarkable.  IMPRESSION: No active disease.   Electronically Signed   By: Lucienne Capers M.D.   On: 11/25/2014 22:16    Microbiology: Recent Results (from the past 240 hour(s))  Culture, Urine     Status: None   Collection Time: 11/26/14  5:39 AM  Result Value Ref Range Status   Specimen Description URINE, RANDOM  Final   Special Requests ADDE [id number]  Final   Colony Count NO GROWTH Performed at [facility]   Final   Culture NO GROWTH Performed at [facility]   Final   Report Status 11/28/2014 FINAL  Final     Labs: Basic Metabolic Panel:  Recent Labs Lab 11/25/14 1950 11/26/14 0525 11/26/14 1950 11/27/14 0356 11/28/14 0738  NA 137 139 143 140 140  K 3.5 3.0* 3.9 3.4* 3.5  CL 103 107 111 113* 102  CO2 20 22 22 21 31   GLUCOSE 74 71 104* 134* 94  BUN 21 14 7  <5* 6  CREATININE 1.14* 0.88 0.91 0.70 0.60  CALCIUM 10.2 8.9 9.2 8.7 9.1   Liver Function Tests:  Recent Labs Lab 11/25/14 1950 11/26/14 0525 11/26/14 1950 11/27/14 0356 11/28/14 0738  AST 165* 91* 76* 48* 32  ALT 212* 146* 115* 93* 71*  ALKPHOS 99 74 86 85 98  BILITOT 1.2 0.9 0.8 0.3 0.3  PROT 7.6 5.7* 5.6* 5.3* 5.8*  ALBUMIN 4.6 3.4* 3.3* 3.1* 3.3*   No results for input(s): LIPASE, AMYLASE in the last 168 hours. No results for input(s): AMMONIA in the last 168 hours. CBC:  Recent Labs Lab 11/25/14 1950 11/26/14 0645  WBC 9.4 7.5  NEUTROABS 5.9 3.4  HGB 14.2 12.0  HCT 40.7 34.3*  MCV 92.7 92.0  PLT 313 268   Cardiac Enzymes:  Recent Labs Lab 11/26/14 0525  CKTOTAL 42  TROPONINI <0.03   BNP: BNP (last 3 results) No results for input(s): BNP in the last 8760 hours.  ProBNP (last 3 results) No results for input(s): PROBNP in the last 8760 hours.  CBG: No results for input(s): GLUCAP in the last 168  hours.     SignedDomenic Polite  [facility] 11/28/2014, 12:55 PM

## 2014-11-28 NOTE — Progress Notes (Signed)
NURSING PROGRESS NOTE  [name] [id number] Discharge Data: 11/28/2014 3:32 PM Attending Provider: No att. providers found PCP:No PCP Per Patient     [name] to be D/C'd Home per MD order.  Discussed with the patient the After Visit Summary and all questions fully answered. All IV's discontinued with no bleeding noted. All belongings returned to patient for patient to take home.   Last Vital Signs:  Blood pressure 146/79, pulse 116, temperature 99 F (37.2 C), temperature source Oral, resp. rate 18, height 5\' 1"  (1.549 m), weight 55.4 kg (122 lb 2.2 oz), SpO2 98 %.  Discharge Medication List   Medication List    TAKE these medications        busPIRone 7.5 MG tablet  Commonly known as:  BUSPAR  Take 1 tablet (7.5 mg total) by mouth 2 (two) times daily.     CALCIUM-VITAMIN D3 PO  Take 1 capsule by mouth daily.     carisoprodol 350 MG tablet  Commonly known as:  SOMA  Take 1 tablet (350 mg total) by mouth at bedtime.     clonazePAM 1 MG tablet  Commonly known as:  KLONOPIN  Take 1 tablet (1 mg total) by mouth 3 (three) times daily as needed (anxiety).     diphenhydrAMINE 25 MG tablet  Commonly known as:  BENADRYL  Take 25 mg by mouth every 6 (six) hours as needed for itching or allergies.     FLUoxetine 20 MG capsule  Commonly known as:  PROZAC  Take 1 capsule (20 mg total) by mouth daily.     gabapentin 100 MG capsule  Commonly known as:  NEURONTIN  Take 1 capsule (100 mg total) by mouth 3 (three) times daily.     levETIRAcetam 500 MG tablet  Commonly known as:  KEPPRA  Take 1 tablet (500 mg total) by mouth 2 (two) times daily.     multivitamin with minerals Tabs tablet  Take 1 tablet by mouth daily.     pantoprazole 40 MG tablet  Commonly known as:  PROTONIX  Take 1 tablet (40 mg total) by mouth 2 (two) times daily.

## 2014-11-28 NOTE — Progress Notes (Signed)
Patient ID: [name], female   DOB: 01/14/1966, 49 y.o.   MRN: [id number] D: Client recent discharge 2/15 from [facility], "was not prepared for work" Client reports passive suicidal ideations "I wouldn't do anything while I'm here, I'm not that selfish to put them(peers) through that" client contracts for safety. Client notes stressors "mom is in the nursing home, I don't have the money for this month. Client has bruise to forehead and nose. "I tripped over my fat dog" Client complaining about medications asking for klonopin, soma, amitriptyline.  A: Writer introduced self to client, provided emotional support. Client encouraged to use resources, call sisters and tell them she needs help. Reviewed medications, administered as ordered. Staff will monitor q63min for safety. R:Client is safe on the unit, attended karaoke.

## 2014-11-28 NOTE — Progress Notes (Signed)
Patient ID: [name], female   DOB: 1966/02/27, 49 y.o.   MRN: [id number] Valuables envelope sent over from [facility] at [facility].  Included was set of keys and car remote control.  No wallet was listed.

## 2014-11-28 NOTE — Progress Notes (Signed)
During admission process, patient states that her wallet is missing.  Called [facility] at [facility] and spoke to [place].  She informed me that they did not show evidence of a wallet, but there were some keys.  Lattie Haw will call me back; states their Surveyor, quantity is working on it.

## 2014-11-29 ENCOUNTER — Encounter (HOSPITAL_COMMUNITY): Payer: Self-pay | Admitting: Psychiatry

## 2014-11-29 DIAGNOSIS — F332 Major depressive disorder, recurrent severe without psychotic features: Secondary | ICD-10-CM | POA: Diagnosis present

## 2014-11-29 DIAGNOSIS — F4000 Agoraphobia, unspecified: Secondary | ICD-10-CM | POA: Diagnosis present

## 2014-11-29 DIAGNOSIS — F410 Panic disorder [episodic paroxysmal anxiety] without agoraphobia: Secondary | ICD-10-CM | POA: Diagnosis present

## 2014-11-29 MED ORDER — HYDROXYZINE HCL 50 MG PO TABS
50.0000 mg | ORAL_TABLET | Freq: Once | ORAL | Status: AC
  Administered 2014-11-29: 50 mg via ORAL
  Filled 2014-11-29 (×2): qty 1

## 2014-11-29 MED ORDER — CLONAZEPAM 1 MG PO TABS: 1.0000 mg | ORAL_TABLET | Freq: Once | ORAL | Status: DC

## 2014-11-29 MED ORDER — FLUOXETINE HCL 20 MG PO CAPS
40.0000 mg | ORAL_CAPSULE | Freq: Every day | ORAL | Status: DC
  Administered 2014-11-30 – 2014-12-02 (×3): 40 mg via ORAL
  Filled 2014-11-29 (×5): qty 2

## 2014-11-29 MED ORDER — LISINOPRIL 10 MG PO TABS
10.0000 mg | ORAL_TABLET | Freq: Every day | ORAL | Status: DC
  Administered 2014-11-30 – 2014-12-01 (×2): 10 mg via ORAL
  Filled 2014-11-29 (×4): qty 1

## 2014-11-29 MED ORDER — LISINOPRIL 20 MG PO TABS
20.0000 mg | ORAL_TABLET | Freq: Once | ORAL | Status: AC
  Administered 2014-11-29: 20 mg via ORAL
  Filled 2014-11-29: qty 1

## 2014-11-29 MED ORDER — METAXALONE 800 MG PO TABS
800.0000 mg | ORAL_TABLET | Freq: Three times a day (TID) | ORAL | Status: DC | PRN
  Administered 2014-11-29 – 2014-11-30 (×3): 800 mg via ORAL
  Filled 2014-11-29 (×3): qty 1

## 2014-11-29 MED ORDER — BACITRACIN-NEOMYCIN-POLYMYXIN OINTMENT TUBE
TOPICAL_OINTMENT | Freq: Two times a day (BID) | CUTANEOUS | Status: DC
Start: 2014-11-29 — End: 2014-12-11
  Administered 2014-11-29 – 2014-12-07 (×4): via TOPICAL
  Filled 2014-11-29: qty 1
  Filled 2014-11-29: qty 15

## 2014-11-29 MED ORDER — CLONAZEPAM 0.5 MG PO TABS
0.2500 mg | ORAL_TABLET | Freq: Three times a day (TID) | ORAL | Status: DC | PRN
  Administered 2014-11-29 – 2014-11-30 (×4): 0.25 mg via ORAL
  Filled 2014-11-29 (×4): qty 1

## 2014-11-29 MED ORDER — MELOXICAM 7.5 MG PO TABS: 7.5000 mg | ORAL_TABLET | Freq: Every day | ORAL | Status: DC

## 2014-11-29 NOTE — Progress Notes (Signed)
Writer has observed patient up in the dayroom interacting appropriately with peers. She attended group this evening and reported to writer earlier that her sleep medication causes her to have nightmares and requested something else. NP Dell Ponto was notified and an order was received. She requested to speak with Dr Sabra Heck because she reports that he know her medications, writer informed her that he had left for the evening. She reports that she will talk to him on tomorrow concerning her medications. She has been pleasant and cooperative. She reports passive si and verbally contracts for safety, she denies hi/a/v hallucinations. Support and encouragement given, safety maintained on unit with 15 min checks.

## 2014-11-29 NOTE — Progress Notes (Signed)
Recreation Therapy Notes  Date: 03.11.2016 Time: 9:30am Location: [address] Hall Group Room   Group Topic: Stress Management  Goal Area(s) Addresses:  Patient will actively participate in stress management techniques presented during session.   Behavioral Response: Engaged, Attentive.   Intervention: Stress Management  Activity :  Body Scanning & Deep Breathing. Patient participated in deep breathing and progressive body scanning, starting with their toes and moving up their bodies to their heads.   Education:  Stress Management, Discharge Planning.   Education Outcome: Acknowledges education  Clinical Observations/Feedback: Patient actively participated in both techniques, voicing no concerns during session and displaying ability to practice technique independently post d/c.   Laureen Ochs Terrea Bruster, LRT/CTRS  Kace Hartje L 11/29/2014 1:17 PM
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
## 2014-11-29 NOTE — H&P (Signed)
Psychiatric Admission Assessment Adult  Patient Identification: [name]  MRN:  [id number]  Date of Evaluation:  11/29/2014  Chief Complaint:  BIPOLAR DISORDER  Principal Diagnosis: <principal problem not specified>  Diagnosis:   Patient Active Problem List   Diagnosis Date Noted  . MDD (major depressive disorder) [F32.2] 11/28/2014  . ARF (acute renal failure) [N17.9] 11/25/2014  . Intentional acetaminophen overdose [T39.1X2A] 10/26/2014  . Major depressive disorder, recurrent, severe without psychotic features [F33.2]   . Insomnia [G47.00]   . Chronic pain syndrome [G89.4]   . Bipolar 1 disorder, mixed, severe [F31.63] 10/09/2014  . Acute blood loss anemia [D62] 10/07/2014  . Liver failure, acute [K72.00]   . GI bleed [K92.2] 10/06/2014  . Duodenal ulcer hemorrhage [K26.4] 10/06/2014  . Vomiting [R11.10] 10/05/2014  . Acute hepatitis [K72.00] 10/05/2014  . Acute renal insufficiency [N28.9] 10/05/2014  . Seizure disorder [G40.909] 10/05/2014  . Hyperammonemia [E72.20] 10/05/2014  . Suicidal ideation [R45.851] 10/05/2014  . Bipolar disorder [F31.9] 10/01/2014  . Major depression [F32.2] 10/01/2014  . Nausea and vomiting [R11.2] 09/30/2014  . Diarrhea [R19.7] 09/30/2014  . SIRS (systemic inflammatory response syndrome) [A41.9] 07/15/2014  . Tylenol overdose [T39.1X4A] 07/13/2014  . Hypokalemia [E87.6] 07/13/2014  . Depression [F32.9] 05/25/2012   History of Present Illness: Bradstreet is a 49 year old Caucasian female. Admitted to [facility] from the [facility] with complaints of worsening depression & suicide attempts by overdose. She reports, "My neighbor found me unconscious last Sunday & took me to the ED. I did take an overdose of Tylenol because I wanted to die. I did not want to be alive any more. I don't have any more hope to live. I don't know why I feel this way. I'm still sober, I have not messed with any drugs or alcohol. So what, I'm at a point whereby I'm going to  lose my home anyway. I'm so far behind on my rent. I'm probably gonna lose my job too. I have not been going to work. I'm so scared to leave my house. I have been missing work since October last year. My worsening depression started last September, now it sky rocketed & I'm never going to get better. This is my fourth suicide attempt since October last year, all by overdose".  Elements:  Location:  Major depressive disorder,. Quality:  Anhedonia, hig anxiety levels, insomnia, suicidal ideations, suicide attempts. Severity:  Severe, says depression worsened since October last year". Timing:  Overdosed last Sunday. Duration:  Chronic. Context:  "I don't want to live any more, started feeling like this since October, never gotten any better".  Associated Signs/Symptoms:  Depression Symptoms:  feelings of worthlessness/guilt, hopelessness, suicidal thoughts with specific plan, anxiety, insomnia, loss of energy/fatigue,  (Hypo) Manic Symptoms:  Impulsivity,  Anxiety Symptoms:  Excessive Worry,  Psychotic Symptoms:  Denies  PTSD Symptoms: NA  Total Time spent with patient: 1 hour  Past Medical History:  Past Medical History  Diagnosis Date  . Migraines   . Endometriosis   . ETOH abuse     sober 3 1/2 years  . Anorexia   . Depression   . Anxiety   . DJD (degenerative joint disease) of cervical spine   . Seizures   . PICC (peripherally inserted central catheter) in place     rt neck    Past Surgical History  Procedure Laterality Date  . Knee surgery    . Abdominal surgery    . Cholecystectomy    . Appendectomy    .  Abdominal hysterectomy    . Nasal sinus surgery    . Laproscopy    . Carpal tunnel release      2010  . Esophagogastroduodenoscopy N/A 10/06/2014    Procedure: ESOPHAGOGASTRODUODENOSCOPY (EGD);  Surgeon: Inda Castle, MD;  Location: [facility];  Service: Endoscopy;  Laterality: N/A;   Family History:  Family History  Problem Relation Age of Onset  .  Hypertension Mother   . Hyperlipidemia Mother   . Heart failure Father   . Cancer Other    Social History:  History  Alcohol Use No     History  Drug Use No    History   Social History  . Marital Status: Single    Spouse Name: N/A  . Number of Children: N/A  . Years of Education: N/A   Social History Main Topics  . Smoking status: Current Every Day Smoker -- 0.25 packs/day for 20 years    Types: Cigarettes  . Smokeless tobacco: Never Used  . Alcohol Use: No  . Drug Use: No  . Sexual Activity: Not on file   Other Topics Concern  . None   Social History Narrative   Additional Social History:   Musculoskeletal: Strength & Muscle Tone: within normal limits Gait & Station: normal Patient leans: N/A  Psychiatric Specialty Exam: Physical Exam  Constitutional: She is oriented to person, place, and time. She appears well-developed.  HENT:  Head: Normocephalic.  Eyes: Pupils are equal, round, and reactive to light.  Neck: Normal range of motion.  Cardiovascular: Normal rate.   Respiratory: Effort normal.  GI: Soft.  Genitourinary:  Denies any issues in this area  Musculoskeletal: Normal range of motion.  Neurological: She is alert and oriented to person, place, and time.  Skin: Skin is warm and dry.  Psychiatric: Her speech is normal and behavior is normal. Thought content normal. Her mood appears anxious. Her affect is not angry, not blunt and not labile. Cognition and memory are normal. She expresses impulsivity. She exhibits a depressed mood.    Review of Systems  Constitutional: Negative.   HENT: Negative.   Eyes: Negative.   Respiratory: Negative.   Cardiovascular: Negative.   Gastrointestinal: Negative.   Genitourinary: Negative.   Musculoskeletal: Negative.   Skin: Negative.   Neurological: Negative.   Endo/Heme/Allergies: Negative.   Psychiatric/Behavioral: Positive for depression. Negative for suicidal ideas, hallucinations, memory loss and  substance abuse. The patient is nervous/anxious and has insomnia.     Blood pressure 137/104, pulse 88, temperature 97.9 F (36.6 C), temperature source Oral, resp. rate 16, height 5' 1.5" (1.562 m), weight 58.514 kg (129 lb).Body mass index is 23.98 kg/(m^2).  General Appearance: Casual  Eye Contact::  Good  Speech:  Clear and Coherent  Volume:  Normal  Mood:  Anxious and Depressed  Affect:  Appropriate and Non-Congruent  Thought Process:  Coherent and Intact  Orientation:  Full (Time, Place, and Person)  Thought Content:  Rumination and Denies any hallucinations, delusions or paranoia  Suicidal Thoughts:  Denies  Homicidal Thoughts:  Denies  Memory:  Immediate;   Good Recent;   Good Remote;   Good  Judgement:  Fair  Insight:  Not present  Psychomotor Activity:  Normal  Concentration:  Fair  Recall:  Good  Fund of Knowledge:Fair  Language: Good  Akathisia:  No  Handed:  Right  AIMS (if indicated):     Assets:  Desire for Improvement  ADL's:  Intact  Cognition: WNL  Sleep:  Number  of Hours: 4.25   Risk to Self: Is patient at risk for suicide?: Yes Risk to Others: No Prior Inpatient Therapy: Yes Prior Outpatient Therapy: No  Alcohol Screening: 1. How often do you have a drink containing alcohol?: Never 9. Have you or someone else been injured as a result of your drinking?: No 10. Has a relative or friend or a doctor or another health worker been concerned about your drinking or suggested you cut down?: No Alcohol Use Disorder Identification Test Final Score (AUDIT): 0 Brief Intervention: AUDIT score less than 7 or less-screening does not suggest unhealthy drinking-brief intervention not indicated  Allergies:   Allergies  Allergen Reactions  . Aspirin Anaphylaxis  . Doxycycline Anaphylaxis and Other (See Comments)    Joint damage also  . Imitrex [Sumatriptan Base] Other (See Comments)    Severe hypertension  . Iodinated Diagnostic Agents Anaphylaxis  . Lidocaine  Anaphylaxis    Pt states she does well with Marcaine/Bupivicaine without problem  . Prednisone Other (See Comments)    She goes crazy  . Sulfa Antibiotics Anaphylaxis  . Sulfasalazine Anaphylaxis  . Sumatriptan Other (See Comments)    Felt like she was "having a stroke", heart rate and blood pressure "went through the roof"  . Tramadol Other (See Comments)    seizures  . Levofloxacin Other (See Comments)    Joints hurt  . Latex Rash  . Levofloxacin Rash and Other (See Comments)    Joints hurt  . Metrizamide Other (See Comments)  . Nsaids Rash and Other (See Comments)    GI bleed  . Tolmetin Rash   Lab Results:  Results for orders placed or performed during the hospital encounter of 11/25/14 (from the past 48 hour(s))  Comprehensive metabolic panel     Status: Abnormal   Collection Time: 11/28/14  7:38 AM  Result Value Ref Range   Sodium 140 135 - 145 mmol/L   Potassium 3.5 3.5 - 5.1 mmol/L   Chloride 102 96 - 112 mmol/L   CO2 31 19 - 32 mmol/L   Glucose, Bld 94 70 - 99 mg/dL   BUN 6 6 - 23 mg/dL   Creatinine, Ser 0.60 0.50 - 1.10 mg/dL   Calcium 9.1 8.4 - 10.5 mg/dL   Total Protein 5.8 (L) 6.0 - 8.3 g/dL   Albumin 3.3 (L) 3.5 - 5.2 g/dL   AST 32 0 - 37 U/L   ALT 71 (H) 0 - 35 U/L   Alkaline Phosphatase 98 39 - 117 U/L   Total Bilirubin 0.3 0.3 - 1.2 mg/dL   GFR calc non Af Amer >90 >90 mL/min   GFR calc Af Amer >90 >90 mL/min    Comment: (NOTE) The eGFR has been calculated using the CKD EPI equation. This calculation has not been validated in all clinical situations. eGFR's persistently <90 mL/min signify possible Chronic Kidney Disease.    Anion gap 7 5 - 15   Current Medications: Current Facility-Administered Medications  Medication Dose Route Frequency Provider Last Rate Last Dose  . alum & mag hydroxide-simeth (MAALOX/MYLANTA) 200-200-20 MG/5ML suspension 30 mL  30 mL Oral Q4H PRN Shuvon B Rankin, NP      . busPIRone (BUSPAR) tablet 7.5 mg  7.5 mg Oral BID  Shuvon B Rankin, NP   7.5 mg at 11/29/14 2563  . FLUoxetine (PROZAC) capsule 20 mg  20 mg Oral Daily Shuvon B Rankin, NP   20 mg at 11/29/14 8937  . gabapentin (NEURONTIN) capsule 100 mg  100 mg Oral TID Shuvon B Rankin, NP   100 mg at 11/28/14 1838  . levETIRAcetam (KEPPRA) tablet 500 mg  500 mg Oral BID Shuvon B Rankin, NP   500 mg at 11/29/14 1062  . [START ON 11/30/2014] lisinopril (PRINIVIL,ZESTRIL) tablet 10 mg  10 mg Oral Daily Encarnacion Slates, NP      . lisinopril (PRINIVIL,ZESTRIL) tablet 20 mg  20 mg Oral Once Encarnacion Slates, NP      . magnesium hydroxide (MILK OF MAGNESIA) suspension 30 mL  30 mL Oral Daily PRN Shuvon B Rankin, NP      . multivitamin with minerals tablet 1 tablet  1 tablet Oral Daily Shuvon B Rankin, NP   1 tablet at 11/29/14 6948  . pantoprazole (PROTONIX) EC tablet 40 mg  40 mg Oral BID Shuvon B Rankin, NP   40 mg at 11/29/14 5462  . traZODone (DESYREL) tablet 50 mg  50 mg Oral QHS PRN Shuvon B Rankin, NP       PTA Medications: Prescriptions prior to admission  Medication Sig Dispense Refill Last Dose  . busPIRone (BUSPAR) 7.5 MG tablet Take 1 tablet (7.5 mg total) by mouth 2 (two) times daily. 60 tablet 0 Past Month at Unknown time  . Calcium Carbonate-Vitamin D (CALCIUM-VITAMIN D3 PO) Take 1 capsule by mouth daily.   11/25/2014  . carisoprodol (SOMA) 350 MG tablet Take 1 tablet (350 mg total) by mouth at bedtime. (Patient taking differently: Take 350 mg by mouth 2 (two) times daily. ) 30 tablet 0 11/25/2014  . clonazePAM (KLONOPIN) 1 MG tablet Take 1 tablet (1 mg total) by mouth 3 (three) times daily as needed (anxiety). 12 tablet 0 11/24/2014  . diphenhydrAMINE (BENADRYL) 25 MG tablet Take 25 mg by mouth every 6 (six) hours as needed for itching or allergies.   Past Week at Unknown time  . FLUoxetine (PROZAC) 20 MG capsule Take 1 capsule (20 mg total) by mouth daily. 30 capsule 0 11/25/2014  . gabapentin (NEURONTIN) 100 MG capsule Take 1 capsule (100 mg total) by mouth 3  (three) times daily. 90 capsule 0 Past Week at Unknown time  . levETIRAcetam (KEPPRA) 500 MG tablet Take 1 tablet (500 mg total) by mouth 2 (two) times daily. 60 tablet 0 11/24/2014  . Multiple Vitamin (MULTIVITAMIN WITH MINERALS) TABS tablet Take 1 tablet by mouth daily.   11/24/2014  . pantoprazole (PROTONIX) 40 MG tablet Take 1 tablet (40 mg total) by mouth 2 (two) times daily. 60 tablet 0 11/24/2014   Previous Psychotropic Medications: Yes   Substance Abuse History in the last 12 months:  No.  Consequences of Substance Abuse: Medical Consequences:  Liver damage, Possible death by overdose Legal Consequences:  Arrests, jail time, Loss of driving privilege. Family Consequences:  Family discord, divorce and or separation.  Results for orders placed or performed during the hospital encounter of 11/25/14 (from the past 72 hour(s))  Comprehensive metabolic panel     Status: Abnormal   Collection Time: 11/26/14  7:50 PM  Result Value Ref Range   Sodium 143 135 - 145 mmol/L   Potassium 3.9 3.5 - 5.1 mmol/L    Comment: DELTA CHECK NOTED   Chloride 111 96 - 112 mmol/L   CO2 22 19 - 32 mmol/L   Glucose, Bld 104 (H) 70 - 99 mg/dL   BUN 7 6 - 23 mg/dL   Creatinine, Ser 0.91 0.50 - 1.10 mg/dL   Calcium 9.2 8.4 - 10.5 mg/dL  Total Protein 5.6 (L) 6.0 - 8.3 g/dL   Albumin 3.3 (L) 3.5 - 5.2 g/dL   AST 76 (H) 0 - 37 U/L   ALT 115 (H) 0 - 35 U/L   Alkaline Phosphatase 86 39 - 117 U/L   Total Bilirubin 0.8 0.3 - 1.2 mg/dL   GFR calc non Af Amer 73 (L) >90 mL/min   GFR calc Af Amer 85 (L) >90 mL/min    Comment: (NOTE) The eGFR has been calculated using the CKD EPI equation. This calculation has not been validated in all clinical situations. eGFR's persistently <90 mL/min signify possible Chronic Kidney Disease.    Anion gap 10 5 - 15  Comprehensive metabolic panel     Status: Abnormal   Collection Time: 11/27/14  3:56 AM  Result Value Ref Range   Sodium 140 135 - 145 mmol/L   Potassium 3.4  (L) 3.5 - 5.1 mmol/L   Chloride 113 (H) 96 - 112 mmol/L   CO2 21 19 - 32 mmol/L   Glucose, Bld 134 (H) 70 - 99 mg/dL   BUN <5 (L) 6 - 23 mg/dL   Creatinine, Ser 0.70 0.50 - 1.10 mg/dL   Calcium 8.7 8.4 - 10.5 mg/dL   Total Protein 5.3 (L) 6.0 - 8.3 g/dL   Albumin 3.1 (L) 3.5 - 5.2 g/dL   AST 48 (H) 0 - 37 U/L   ALT 93 (H) 0 - 35 U/L   Alkaline Phosphatase 85 39 - 117 U/L   Total Bilirubin 0.3 0.3 - 1.2 mg/dL   GFR calc non Af Amer >90 >90 mL/min   GFR calc Af Amer >90 >90 mL/min    Comment: (NOTE) The eGFR has been calculated using the CKD EPI equation. This calculation has not been validated in all clinical situations. eGFR's persistently <90 mL/min signify possible Chronic Kidney Disease.    Anion gap 6 5 - 15  Protime-INR     Status: None   Collection Time: 11/27/14  3:56 AM  Result Value Ref Range   Prothrombin Time 13.4 11.6 - 15.2 seconds   INR 1.01 0.00 - 1.49  Acetaminophen level     Status: Abnormal   Collection Time: 11/27/14  3:56 AM  Result Value Ref Range   Acetaminophen (Tylenol), Serum <10.0 (L) 10 - 30 ug/mL    Comment:        THERAPEUTIC CONCENTRATIONS VARY SIGNIFICANTLY. A RANGE OF 10-30 ug/mL MAY BE AN EFFECTIVE CONCENTRATION FOR MANY PATIENTS. HOWEVER, SOME ARE BEST TREATED AT CONCENTRATIONS OUTSIDE THIS RANGE. ACETAMINOPHEN CONCENTRATIONS >150 ug/mL AT 4 HOURS AFTER INGESTION AND >50 ug/mL AT 12 HOURS AFTER INGESTION ARE OFTEN ASSOCIATED WITH TOXIC REACTIONS.   Comprehensive metabolic panel     Status: Abnormal   Collection Time: 11/28/14  7:38 AM  Result Value Ref Range   Sodium 140 135 - 145 mmol/L   Potassium 3.5 3.5 - 5.1 mmol/L   Chloride 102 96 - 112 mmol/L   CO2 31 19 - 32 mmol/L   Glucose, Bld 94 70 - 99 mg/dL   BUN 6 6 - 23 mg/dL   Creatinine, Ser 0.60 0.50 - 1.10 mg/dL   Calcium 9.1 8.4 - 10.5 mg/dL   Total Protein 5.8 (L) 6.0 - 8.3 g/dL   Albumin 3.3 (L) 3.5 - 5.2 g/dL   AST 32 0 - 37 U/L   ALT 71 (H) 0 - 35 U/L   Alkaline  Phosphatase 98 39 - 117 U/L   Total Bilirubin 0.3  0.3 - 1.2 mg/dL   GFR calc non Af Amer >90 >90 mL/min   GFR calc Af Amer >90 >90 mL/min    Comment: (NOTE) The eGFR has been calculated using the CKD EPI equation. This calculation has not been validated in all clinical situations. eGFR's persistently <90 mL/min signify possible Chronic Kidney Disease.    Anion gap 7 5 - 15    Observation Level/Precautions:  15 minute checks  Laboratory:  Per ED  Psychotherapy: Group sessions   Medications:  See medication lists  Consultations: As needed   Discharge Concerns:  Mood stability, Safety  Estimated LOS: 3-5 days  Other:     Psychological Evaluations: Yes   Treatment Plan Summary: Daily contact with patient to assess and evaluate symptoms and progress in treatment and Medication management: Treatment Plan/Recommendations: 1. Admit for crisis management and stabilization, estimated length of stay 3-5 days.  2. Medication management to reduce current symptoms to base line and improve the patient's overall level of functioning; increase Prozac to 40 mg daily for depression starting in am, Resume Klonopin 0.25 mg for anxiety, Trazodone 50 mg for insomnia.  3. Treat health problems as indicated; Resume Keppra 500 mg for seizure activities, Lisinopril 10 mg for HTN, Protonix 40 mg for GERD 4. Develop treatment plan to decrease risk of relapse upon discharge and the need for readmission.  5. Psycho-social education regarding relapse prevention and self care.  6. Health care follow up as needed for medical problems.  7. Review, reconcile, and reinstate any pertinent home medications for other health issues where appropriate. 8. Call for consults with hospitalist for any additional specialty patient care services as needed.  Medical Decision Making:  New problem, with additional work up planned, Review of Psycho-Social Stressors (1), Review or order clinical lab tests (1), Review and summation of  old records (2), Review of Medication Regimen & Side Effects (2) and Review of New Medication or Change in Dosage (2)  I certify that inpatient services furnished can reasonably be expected to improve the patient's condition.   Lindell Spar I, PMHNP-BC 3/11/201610:58 AM

## 2014-11-29 NOTE — Progress Notes (Signed)
D: Patient remains labile.  She reports passive SI and contracts for safety on the unit.  She also reports increased anxiety and requests medication.  Informed patient to talk with the provider today regarding medication.  She requested to sign 72 hour discharge today and was informed she signed it yesterday.  She states, "if I can't see Dr. Sabra Heck, I want to leave here."  She denies HI/AVH.  Patient has been attending groups today.  She has minimal interaction with staff and peers.  Patient rates her depression as a 7; hopelessness as an 8; anxiety as a 9.  Her goal is to "find gratitude and try to be more positive."  A: Continue to monitor medication management and MD order.  Safety checks continued every 15 minutes per protocol.  Meet 1:1 with patient to discuss concerns and offer encouragement. R: Patient remains ambivalent and irritable.

## 2014-11-29 NOTE — Tx Team (Signed)
Interdisciplinary Treatment Plan Update   Date Reviewed:  11/29/2014  Time Reviewed:  8:44 AM  Progress in Treatment:   Attending groups:Patient is new to the [facility]. Participating in groups: Patient is new to the [facility]. Taking medication as prescribed: Yes  Tolerating medication: Yes Family/Significant other contact made:  No, but will ask patient for consent for collateral contact Patient understands diagnosis: Yes, patient understands diagnosis and need for treatment. Discussing patient identified problems/goals with staff: Yes, patient is able to express goals for treatment and discharge. Medical problems stabilized or resolved: Yes Denies suicidal/homicidal ideation: No Patient has not harmed self or others: Yes  For review of initial/current patient goals, please see plan of care.  Estimated Length of Stay:  3-5 days  Reasons for Continued Hospitalization:  Anxiety Depression Medication stabilization Suicidal ideation  New Problems/Goals identified:    Discharge Plan or Barriers:   Home with outpatient follow up to be determined  Additional Comments:    [name] is a 49 y.o. female with history of depression, seizure, chronic pain who was admitted last month for Tylenol overdose was brought to the ER after patient had taken intentionally overdose of Tylenol since today morning. Patient states she has been having chronic pain and she was depressed and suicidal and started taking Tylenol intentionally to commit suicide. Patient has taken multiple doses of Tylenol. She also has been having nausea vomiting since morning. She also had fallen and hit her head when she was walking and tripped on her dog. In the ER CT head shows some contusion.  Patient and CSW reviewed patient's identified goals and treatment plan.  Patient verbalized understanding and agreed to treatment plan.   Attendees:  Patient:  11/29/2014 8:44 AM   Signature:  Gabriel Earing, MD 11/29/2014 8:44 AM  Signature:  Carlton Adam, MD 11/29/2014 8:44 AM  Signature:Patti Dukes, RN 11/29/2014 8:44 AM  Signature:   11/29/2014 8:44 AM  Signature:   11/29/2014 8:44 AM  Signature:  Joette Catching, LCSW 11/29/2014 8:44 AM  Signature:  Erasmo Downer Drinkard, LCSW-A 11/29/2014 8:44 AM  Signature:  Lucinda Dell, Care Coordinator [facility] 11/29/2014 8:44 AM  Signature:   11/29/2014 8:44 AM  Signature: 11/29/2014  8:44 AM  Signature:   Lars Pinks, RN [facility] 11/29/2014  8:44 AM  Signature:   11/29/2014  8:44 AM    Scribe for Treatment Team:   Joette Catching,  11/29/2014 8:44 AM

## 2014-11-29 NOTE — Clinical Social Work Note (Signed)
CSW attempted to contact patient's supervisor to advise of hospitalization as patient requested.  Message for supervisor to return CSW's call.  Message also left on sister's voicemail.  Patient asked that SPE not be reviewed with sister.  Call to sister is to notify her of patient's hospitalization.

## 2014-11-29 NOTE — BHH Group Notes (Signed)
[facility] LCSW Aftercare Discharge Planning Group Note   11/29/2014 9:44 AM    Participation Quality:  Appropraite  Mood/Affect:  Appropriate  Depression Rating:  7  Anxiety Rating:  9  Thoughts of Suicide:  Yes  Will you contract for safety? Yes  Current AVH:  No  Plan for Discharge/Comments:  Patient attended discharge planning group and actively participated in group. Patient advised of not doing well today.  She stated she is not being seen by an outpatient provider and is uncertain where she will live at discharge. Suicide prevention education reviewed and SPE document provided.   Transportation Means: Patient has transportation.   Supports:  Patient has a support system.   Niah Heinle, [place] Post

## 2014-11-29 NOTE — BHH Group Notes (Signed)
[place] [facility] LCSW Group Therapy  Feelings Around Relapse 1:15 -2:30        11/29/2014   Type of Therapy:  Group Therapy  Participation Level:  Appropriate  Participation Quality:  Appropriate  Affect:  Depressed, Tearful  Cognitive:  Attentive Appropriate  Insight:  Developing/Improving  Engagement in Therapy: Developing/Improving  Modes of Intervention:  Discussion Exploration Problem-Solving Supportive  Summary of Progress/Problems:  The topic for today was feelings around relapse.    Patient processed feelings toward relapse and was able to relate to peers. Patient shared relapsing for her is isolating and having negative thoughts.  Patient identified coping skills that can be used to prevent a relapse including reaching out to others.   Concha Pyo 11/29/2014

## 2014-11-29 NOTE — BHH Suicide Risk Assessment (Signed)
[facility] INPATIENT:  Family/Significant Other Suicide Prevention Education  Suicide Prevention Education:  Patient Refusal for Family/Significant Other Suicide Prevention Education: The patient [name] has refused to provide written consent for family/significant other to be provided Family/Significant Other Suicide Prevention Education during admission and/or prior to discharge.  Physician notified.  Concha Pyo 11/29/2014, 12:33 PM

## 2014-11-29 NOTE — [facility] Suicide Risk Assessment (Signed)
[facility] Admission Suicide Risk Assessment   Nursing information obtained from:    Demographic factors:    Current Mental Status:    Loss Factors:    Historical Factors:    Risk Reduction Factors:    Total Time spent with patient: 30 minutes Principal Problem: MDD (major depressive disorder), recurrent severe, without psychosis Diagnosis:   Patient Active Problem List   Diagnosis Date Noted  . MDD (major depressive disorder), recurrent severe, without psychosis [F33.2] 11/29/2014  . Panic disorder [F41.0] 11/29/2014  . Agoraphobia [F40.00] 11/29/2014  . ARF (acute renal failure) [N17.9] 11/25/2014  . Intentional acetaminophen overdose [T39.1X2A] 10/26/2014  . Major depressive disorder, recurrent, severe without psychotic features [F33.2]   . Insomnia [G47.00]   . Chronic pain syndrome [G89.4]   . Bipolar 1 disorder, mixed, severe [F31.63] 10/09/2014  . Acute blood loss anemia [D62] 10/07/2014  . Liver failure, acute [K72.00]   . GI bleed [K92.2] 10/06/2014  . Duodenal ulcer hemorrhage [K26.4] 10/06/2014  . Vomiting [R11.10] 10/05/2014  . Acute hepatitis [K72.00] 10/05/2014  . Acute renal insufficiency [N28.9] 10/05/2014  . Seizure disorder [G40.909] 10/05/2014  . Hyperammonemia [E72.20] 10/05/2014  . Suicidal ideation [R45.851] 10/05/2014  . Bipolar disorder [F31.9] 10/01/2014  . Major depression [F32.2] 10/01/2014  . Nausea and vomiting [R11.2] 09/30/2014  . Diarrhea [R19.7] 09/30/2014  . SIRS (systemic inflammatory response syndrome) [A41.9] 07/15/2014  . Tylenol overdose [T39.1X4A] 07/13/2014  . Hypokalemia [E87.6] 07/13/2014     Continued Clinical Symptoms:  Alcohol Use Disorder Identification Test Final Score (AUDIT): 0 The "Alcohol Use Disorders Identification Test", Guidelines for Use in Primary Care, Second Edition.  World Pharmacologist [facility]). Score between 0-7:  no or low risk or alcohol related problems. Score between 8-15:  moderate risk of alcohol related  problems. Score between 16-19:  high risk of alcohol related problems. Score 20 or above:  warrants further diagnostic evaluation for alcohol dependence and treatment.   CLINICAL FACTORS:   Severe Anxiety and/or Agitation Panic Attacks Depression:   Anhedonia Hopelessness Impulsivity Unstable or Poor Therapeutic Relationship Medical Diagnoses and Treatments/Surgeries   Musculoskeletal: Strength & Muscle Tone: within normal limits Gait & Station: normal Patient leans: N/A  Psychiatric Specialty Exam: Physical Exam  Review of Systems  Constitutional: Negative.   Respiratory: Negative.   Cardiovascular: Negative.   Gastrointestinal: Negative.   Genitourinary: Negative.   Musculoskeletal: Positive for back pain.  Skin: Negative.   Neurological: Positive for headaches.  Psychiatric/Behavioral: Positive for depression and suicidal ideas. The patient is nervous/anxious and has insomnia.     Blood pressure 137/104, pulse 88, temperature 97.9 F (36.6 C), temperature source Oral, resp. rate 16, height 5' 1.5" (1.562 m), weight 58.514 kg (129 lb).Body mass index is 23.98 kg/(m^2).  General Appearance: Disheveled  Eye Sport and exercise psychologist::  Fair  Speech:  Slow  Volume:  Decreased  Mood:  Anxious, Depressed and Irritable  Affect:  Depressed  Thought Process:  Coherent  Orientation:  Full (Time, Place, and Person)  Thought Content:  Rumination  Suicidal Thoughts:  Yes.  without intent/plan  Homicidal Thoughts:  No  Memory:  Immediate;   Fair Recent;   Fair Remote;   Fair  Judgement:  Impaired  Insight:  Lacking  Psychomotor Activity:  Restlessness  Concentration:  Poor  Recall:  [place]  Language: Fair  Akathisia:  No  Handed:  Right  AIMS (if indicated):     Assets:  Communication Skills  Sleep:  Number of Hours: 4.25  Cognition: WNL  ADL's:  Intact     COGNITIVE FEATURES THAT CONTRIBUTE TO RISK:  Closed-mindedness, Polarized thinking and Thought  constriction (tunnel vision)    SUICIDE RISK:   Extreme:  Frequent, intense, and enduring suicidal ideation, specific plans, clear subjective and objective intent, impaired self-control, severe dysphoria/symptomatology, many risk factors and no protective factors.  PLAN OF CARE: Patient will benefit from inpatient treatment and stabilization.  Estimated length of stay is 5-7 days.  Reviewed past medical records,treatment plan.  Will continue to monitor vitals ,medication compliance and treatment side effects while patient is here.  Will monitor for medical issues as well as call consult as needed.  Reviewed labs ,will order as needed.  CSW will start working on disposition.  Patient to participate in therapeutic milieu .       Medical Decision Making:  New problem, with additional work up planned, Review of Psycho-Social Stressors (1), Review or order clinical lab tests (1), Decision to obtain old records (1), Review and summation of old records (2), Established Problem, Worsening (2), Review of Last Therapy Session (1), Review of Medication Regimen & Side Effects (2) and Review of New Medication or Change in Dosage (2)  I certify that inpatient services furnished can reasonably be expected to improve the patient's condition.   Arline Ketter md 11/29/2014, 4:18 PM

## 2014-11-29 NOTE — Progress Notes (Signed)
The patient attended last evening's Karaoke group and was appropriate.

## 2014-11-30 MED ORDER — CARISOPRODOL 350 MG PO TABS
350.0000 mg | ORAL_TABLET | Freq: Every day | ORAL | Status: DC
  Administered 2014-11-30 – 2014-12-01 (×2): 350 mg via ORAL
  Filled 2014-11-30 (×2): qty 1

## 2014-11-30 MED ORDER — CLONAZEPAM 0.5 MG PO TABS
0.5000 mg | ORAL_TABLET | Freq: Three times a day (TID) | ORAL | Status: DC | PRN
  Administered 2014-11-30 – 2014-12-10 (×23): 0.5 mg via ORAL
  Filled 2014-11-30 (×23): qty 1

## 2014-11-30 NOTE — Progress Notes (Addendum)
[facility] MD Progress Note  11/30/2014 5:53 PM Alonnie Bieker  MRN:  [id number]  Subjective:  [name] says she is not doing too well today. Says her anxiety is too high. Says her depression is not improving, says she is not sleeping at night. Says because of her anxiety, she was grinding her teeth last night and broke one of them. Currier says she needs to be put back on her Manuela Neptune to be able to sleep tonight. She adds that she feels hopeless at this point. Says she cannot take Neurontin. Rates her depression at #7 & anxiety #8. Sarah's affects & mannerism are not congruent with the reported symptoms.   Principal Problem: MDD (major depressive disorder), recurrent severe, without psychosis Diagnosis:   Patient Active Problem List   Diagnosis Date Noted  . MDD (major depressive disorder), recurrent severe, without psychosis [F33.2] 11/29/2014  . Panic disorder [F41.0] 11/29/2014  . Agoraphobia [F40.00] 11/29/2014  . ARF (acute renal failure) [N17.9] 11/25/2014  . Intentional acetaminophen overdose [T39.1X2A] 10/26/2014  . Major depressive disorder, recurrent, severe without psychotic features [F33.2]   . Insomnia [G47.00]   . Chronic pain syndrome [G89.4]   . Bipolar 1 disorder, mixed, severe [F31.63] 10/09/2014  . Acute blood loss anemia [D62] 10/07/2014  . Liver failure, acute [K72.00]   . GI bleed [K92.2] 10/06/2014  . Duodenal ulcer hemorrhage [K26.4] 10/06/2014  . Vomiting [R11.10] 10/05/2014  . Acute hepatitis [K72.00] 10/05/2014  . Acute renal insufficiency [N28.9] 10/05/2014  . Seizure disorder [G40.909] 10/05/2014  . Hyperammonemia [E72.20] 10/05/2014  . Suicidal ideation [R45.851] 10/05/2014  . Bipolar disorder [F31.9] 10/01/2014  . Major depression [F32.2] 10/01/2014  . Nausea and vomiting [R11.2] 09/30/2014  . Diarrhea [R19.7] 09/30/2014  . SIRS (systemic inflammatory response syndrome) [A41.9] 07/15/2014  . Tylenol overdose [T39.1X4A] 07/13/2014  . Hypokalemia [E87.6] 07/13/2014    Total Time spent with patient: 35 minutes  Past Medical History:  Past Medical History  Diagnosis Date  . Migraines   . Endometriosis   . ETOH abuse     sober 3 1/2 years  . Anorexia   . Depression   . Anxiety   . DJD (degenerative joint disease) of cervical spine   . Seizures   . PICC (peripherally inserted central catheter) in place     rt neck    Past Surgical History  Procedure Laterality Date  . Knee surgery    . Abdominal surgery    . Cholecystectomy    . Appendectomy    . Abdominal hysterectomy    . Nasal sinus surgery    . Laproscopy    . Carpal tunnel release      2010  . Esophagogastroduodenoscopy N/A 10/06/2014    Procedure: ESOPHAGOGASTRODUODENOSCOPY (EGD);  Surgeon: Inda Castle, MD;  Location: [place];  Service: Endoscopy;  Laterality: N/A;   Family History:  Family History  Problem Relation Age of Onset  . Hypertension Mother   . Hyperlipidemia Mother   . Heart failure Father   . Cancer Other    Social History:  History  Alcohol Use No     History  Drug Use No    History   Social History  . Marital Status: Single    Spouse Name: N/A  . Number of Children: N/A  . Years of Education: N/A   Social History Main Topics  . Smoking status: Current Every Day Smoker -- 0.25 packs/day for 20 years    Types: Cigarettes  . Smokeless tobacco: Never Used  .  Alcohol Use: No  . Drug Use: No  . Sexual Activity: Not on file   Other Topics Concern  . None   Social History Narrative   Additional History:    Sleep: Poor  Appetite:  Fair   Assessment:   Musculoskeletal: Strength & Muscle Tone: within normal limits Gait & Station: normal Patient leans: N/A  Psychiatric Specialty Exam: Physical Exam  Review of Systems  Constitutional: Negative.   HENT: Negative.   Eyes: Negative.   Respiratory: Negative.   Cardiovascular: Negative.   Gastrointestinal: Negative.   Genitourinary: Negative.   Musculoskeletal: Negative.   Skin:  Negative.   Neurological: Negative.   Endo/Heme/Allergies: Negative.   Psychiatric/Behavioral: Positive for depression and suicidal ideas (Denies intent or plans). Negative for hallucinations, memory loss and substance abuse. The patient is nervous/anxious and has insomnia.     Blood pressure 112/84, pulse 92, temperature 98 F (36.7 C), temperature source Oral, resp. rate 16, height 5' 1.5" (1.562 m), weight 58.514 kg (129 lb).Body mass index is 23.98 kg/(m^2).  General Appearance: Casual  Eye Contact::  Good  Speech:  Clear and Coherent  Volume:  Normal  Mood:  "I'm not doing or feeling well emotionally"  Affect:  Non-Congruent  Thought Process:  Coherent and Intact  Orientation:  Full (Time, Place, and Person)  Thought Content:  Rumination  Suicidal Thoughts:  Yes.  without intent/plan  Homicidal Thoughts:  No  Memory:  Immediate;   Good Recent;   Good Remote;   Good  Judgement:  Fair  Insight:  Lacking  Psychomotor Activity:  Normal  Concentration:  Good  Recall:  Good  Fund of Knowledge:Fair  Language: Good  Akathisia:  No  Handed:  Right  AIMS (if indicated):     Assets:  Communication Skills Desire for Improvement  ADL's:  Intact  Cognition: WNL  Sleep:  Number of Hours: 3.25   Current Medications: Current Facility-Administered Medications  Medication Dose Route Frequency Provider Last Rate Last Dose  . alum & mag hydroxide-simeth (MAALOX/MYLANTA) 200-200-20 MG/5ML suspension 30 mL  30 mL Oral Q4H PRN Shuvon B Rankin, NP      . carisoprodol (SOMA) tablet 350 mg  350 mg Oral QHS Encarnacion Slates, NP      . clonazePAM (KLONOPIN) tablet 0.5 mg  0.5 mg Oral TID PRN Encarnacion Slates, NP      . FLUoxetine (PROZAC) capsule 40 mg  40 mg Oral Daily Ursula Alert, MD   40 mg at 11/30/14 0802  . levETIRAcetam (KEPPRA) tablet 500 mg  500 mg Oral BID Shuvon B Rankin, NP   500 mg at 11/30/14 1630  . lisinopril (PRINIVIL,ZESTRIL) tablet 10 mg  10 mg Oral Daily Encarnacion Slates, NP   10  mg at 11/30/14 0803  . magnesium hydroxide (MILK OF MAGNESIA) suspension 30 mL  30 mL Oral Daily PRN Shuvon B Rankin, NP      . metaxalone (SKELAXIN) tablet 800 mg  800 mg Oral Q8H PRN Encarnacion Slates, NP   800 mg at 11/30/14 1428  . multivitamin with minerals tablet 1 tablet  1 tablet Oral Daily Shuvon B Rankin, NP   1 tablet at 11/30/14 0803  . neomycin-bacitracin-polymyxin (NEOSPORIN) ointment   Topical BID Niel Hummer, NP      . pantoprazole (PROTONIX) EC tablet 40 mg  40 mg Oral BID Shuvon B Rankin, NP   40 mg at 11/30/14 1630  . traZODone (DESYREL) tablet 50 mg  50 mg  Oral QHS PRN Shuvon B Rankin, NP        Lab Results: No results found for this or any previous visit (from the past 48 hour(s)).  Physical Findings: AIMS: Facial and Oral Movements Muscles of Facial Expression: None, normal Lips and Perioral Area: None, normal Jaw: None, normal Tongue: None, normal,Extremity Movements Upper (arms, wrists, hands, fingers): None, normal Lower (legs, knees, ankles, toes): None, normal, Trunk Movements Neck, shoulders, hips: None, normal, Overall Severity Severity of abnormal movements (highest score from questions above): None, normal Incapacitation due to abnormal movements: None, normal Patient's awareness of abnormal movements (rate only patient's report): No Awareness, Dental Status Current problems with teeth and/or dentures?: Yes (broken teeth) Does patient usually wear dentures?: No  CIWA:    COWS:     Treatment Plan Summary: Daily contact with patient to assess and evaluate symptoms and progress in treatment and Medication management: 1. Continue crisis management, mood stabilization & relapse prevention.. 2. Continue current medication management to reduce current symptoms to base line and improve the  patient's overall level of functioning; Continue Fluoxetine 40 mg for depression, increased Klonopin from 0.25 to 0.5 mg tid for severe anxiety, reinstate Soma 350 mg Q bedtime  insomnia, Trazodone 50 mg for insomnia. 3. Treat health problems as indicated; Continue Lisinopril 10 mg for HTN, protonix 40 mg for GERD. Keppra 500 mg for seizures, discontinue Skelaxin, since Manuela Neptune has been reinstated.  4. Develop treatment plan to enhance medication adeherance upon discharge and the need for  readmission. 5. Psycho-social education regarding relapse prevention and self care.  Medical Decision Making:  Established Problem, Stable/Improving (1), Review of Psycho-Social Stressors (1), Review of Medication Regimen & Side Effects (2) and Review of New Medication or Change in Dosage (2)  Encarnacion Slates, PMHNp-BC 11/30/2014, 5:53 PM I agree with assessment and plan Geralyn Flash A. Sabra Heck, M.D.

## 2014-11-30 NOTE — [facility] Group Notes (Signed)
Adult Psychoeducational Group Note  Date:  11/30/2014 Time:  10:10 PM  Group Topic/Focus:  Wrap-Up Group:   The focus of this group is to help patients review their daily goal of treatment and discuss progress on daily workbooks.  Participation Level:  Active  Participation Quality:  Appropriate  Affect:  Appropriate  Cognitive:  Appropriate  Insight: Appropriate  Engagement in Group:  Engaged  Modes of Intervention:  Discussion  Additional Comments:  Hildegard said her day was a mixed bag. She said she got 3 hours of sleep, her shoe split and she said the NP messed up her medications.  She also expressed she is having hot flashes.  Her goal was to get medications straightened out.  Victorino Sparrow A 11/30/2014, 10:10 PM

## 2014-11-30 NOTE — Progress Notes (Signed)
D: Patient is pleasant today.  She remains focused on seeing Dr. Sabra Heck about her medications.  She states the skalaxin is doing well for her pain.  She continues to endorse SI.  She rates her depression as 7; hopelessness as an 8; anxiety as a 9.  Patient continues to complain about her medication regimen and the provider she saw yesterday.  Patient is observed in day room laughing and conversing with others.  She denies HI/AVH.   A: Continue to monitor medication management and MD orders.  Safety checks completed every 15 minutes per protocol.  Meet 1:1 with patient to discuss concerns and offer encouragement.   R: Patient's behavior is appropriate to situation.

## 2014-11-30 NOTE — [facility] Group Notes (Signed)
[facility] Group Notes:  (Clinical Social Work)  11/30/2014   1:15-2:15PM  Summary of Progress/Problems:   The main focus of today's process group was for the patient to identify ways in which they have sabotaged their own mental health wellness/recovery.  Motivational interviewing and a handout were used to explore the benefits and costs of their self-sabotaging behavior as well as the benefits and costs of changing this behavior.  The Stages of Change were explained to the group using a handout, and patients identified where they are with regard to changing self-defeating behaviors.  The patient expressed she self-sabotages with isolation.  She talked at length about the tools that she has to use to remain sober, and advised one resistant patient that her sponsor frequently asks her, "Do you want to be right or do you want to be well?"  She stated that she has to answer that she wants to be well.  Type of Therapy:  Process Group  Participation Level:  Active  Participation Quality:  Attentive, Sharing and Supportive  Affect:  Flat  Cognitive:  Alert, Appropriate and Oriented  Insight:  Engaged  Engagement in Therapy:  Engaged  Modes of Intervention:  Education, Motivational Interviewing   Selmer Dominion, LCSW 11/30/2014, 4:00pm

## 2014-11-30 NOTE — Progress Notes (Signed)
Writer spoke with patient 1:1 and she reported having had a good day. She reports that her goal has been not to isolate so much. She is hopeful to have ECT treatment but needs to find a sponsor because she doesn't have insurance. She reports passive si and verbally contracts for safety, she denies hi/a/v hallucinations. [name] inquired about her medications and was not very happy that she no longer has skelaxin. This information was explained to her by nurse on day shift. Writer encouraged her to speak to her doctor on tomorrow concerning her medications. Safety maintained on unit with 15 min checks.

## 2014-11-30 NOTE — Plan of Care (Signed)
Problem: Alteration in mood Goal: STG-Patient is able to discuss feelings and issues (Patient is able to discuss feelings and issues leading to depression)  Outcome: Progressing Patient has been able to express her concerns and feelings to staff.  She is less irritable and isolative.

## 2014-11-30 NOTE — [facility] Group Notes (Signed)
[facility] Group Notes:  (Nursing/MHT/Case Management/Adjunct)  Date:  11/30/2014  Time:  12:51 PM  Type of Therapy:  Psychoeducational Skills  Participation Level:  Active  Participation Quality:  Appropriate  Affect:  Appropriate  Cognitive:  Appropriate  Insight:  Appropriate  Engagement in Group:  Engaged  Modes of Intervention:  Discussion  Summary of Progress/Problems: Pt did attend self inventory group, pt reported that she was negative SI/HI, no AH/VH noted. Pt rated her depression as a 5, her anxiety as a 8 and her helplessness/hopelessness as a 6.     Pt reported concerns about not being able to sleep last night, having several cracked teeth, having lots of anxiety. Pt advised that the doctor will be made aware.   Benancio Deeds Shanta 11/30/2014, 12:51 PM

## 2014-11-30 NOTE — Plan of Care (Signed)
Problem: Ineffective individual coping Goal: STG: Patient will remain free from self harm Outcome: Progressing Patient has remained free from self harm

## 2014-12-01 MED ORDER — LISINOPRIL 10 MG PO TABS
10.0000 mg | ORAL_TABLET | Freq: Once | ORAL | Status: AC
  Administered 2014-12-01: 10 mg via ORAL
  Filled 2014-12-01: qty 1

## 2014-12-01 MED ORDER — ACETAMINOPHEN 325 MG PO TABS
650.0000 mg | ORAL_TABLET | Freq: Once | ORAL | Status: DC
  Filled 2014-12-01: qty 2

## 2014-12-01 MED ORDER — LISINOPRIL 20 MG PO TABS
20.0000 mg | ORAL_TABLET | Freq: Every day | ORAL | Status: DC
  Administered 2014-12-02 – 2014-12-10 (×9): 20 mg via ORAL
  Filled 2014-12-01 (×16): qty 1

## 2014-12-01 NOTE — Progress Notes (Signed)
Patient ID: Monaca Wadas, female   DOB: September 24, 1965, 49 y.o.   MRN: [id number] [facility] MD Progress Note  12/01/2014 2:39 PM Davon Folta  MRN:  [id number]  Subjective:  Conswella says she is doing so-so. She says everyone thinks she is doing great because she walks around the unit socializing and smiling, that she was only putting on a front. She states, "that is what an addict does. I'm one, I know how to put on a show. She says she knows that she have this terrible pain for the rest of her life, and that is why she still thinks committing suicide is the answer for her. Treina continue to present with good affects, carrying with the other patients, participating group milieu. She complains of headache & back pains, msut is allergic to most pain relief remedies. Will administer Tylenol 650 mg once for headache.   Principal Problem: MDD (major depressive disorder), recurrent severe, without psychosis Diagnosis:   Patient Active Problem List   Diagnosis Date Noted  . MDD (major depressive disorder), recurrent severe, without psychosis [F33.2] 11/29/2014  . Panic disorder [F41.0] 11/29/2014  . Agoraphobia [F40.00] 11/29/2014  . ARF (acute renal failure) [N17.9] 11/25/2014  . Intentional acetaminophen overdose [T39.1X2A] 10/26/2014  . Major depressive disorder, recurrent, severe without psychotic features [F33.2]   . Insomnia [G47.00]   . Chronic pain syndrome [G89.4]   . Bipolar 1 disorder, mixed, severe [F31.63] 10/09/2014  . Acute blood loss anemia [D62] 10/07/2014  . Liver failure, acute [K72.00]   . GI bleed [K92.2] 10/06/2014  . Duodenal ulcer hemorrhage [K26.4] 10/06/2014  . Vomiting [R11.10] 10/05/2014  . Acute hepatitis [K72.00] 10/05/2014  . Acute renal insufficiency [N28.9] 10/05/2014  . Seizure disorder [G40.909] 10/05/2014  . Hyperammonemia [E72.20] 10/05/2014  . Suicidal ideation [R45.851] 10/05/2014  . Bipolar disorder [F31.9] 10/01/2014  . Major depression [F32.2] 10/01/2014  . Nausea and  vomiting [R11.2] 09/30/2014  . Diarrhea [R19.7] 09/30/2014  . SIRS (systemic inflammatory response syndrome) [A41.9] 07/15/2014  . Tylenol overdose [T39.1X4A] 07/13/2014  . Hypokalemia [E87.6] 07/13/2014   Total Time spent with patient: 35 minutes  Past Medical History:  Past Medical History  Diagnosis Date  . Migraines   . Endometriosis   . ETOH abuse     sober 3 1/2 years  . Anorexia   . Depression   . Anxiety   . DJD (degenerative joint disease) of cervical spine   . Seizures   . PICC (peripherally inserted central catheter) in place     rt neck    Past Surgical History  Procedure Laterality Date  . Knee surgery    . Abdominal surgery    . Cholecystectomy    . Appendectomy    . Abdominal hysterectomy    . Nasal sinus surgery    . Laproscopy    . Carpal tunnel release      2010  . Esophagogastroduodenoscopy N/A 10/06/2014    Procedure: ESOPHAGOGASTRODUODENOSCOPY (EGD);  Surgeon: Inda Castle, MD;  Location: [place];  Service: Endoscopy;  Laterality: N/A;   Family History:  Family History  Problem Relation Age of Onset  . Hypertension Mother   . Hyperlipidemia Mother   . Heart failure Father   . Cancer Other    Social History:  History  Alcohol Use No     History  Drug Use No    History   Social History  . Marital Status: Single    Spouse Name: N/A  . Number of Children: N/A  .  Years of Education: N/A   Social History Main Topics  . Smoking status: Current Every Day Smoker -- 0.25 packs/day for 20 years    Types: Cigarettes  . Smokeless tobacco: Never Used  . Alcohol Use: No  . Drug Use: No  . Sexual Activity: Not on file   Other Topics Concern  . None   Social History Narrative   Additional History:    Sleep: Poor  Appetite:  Fair  Assessment:   Musculoskeletal: Strength & Muscle Tone: within normal limits Gait & Station: normal Patient leans: N/A  Psychiatric Specialty Exam: Physical Exam  ROS  Blood pressure 144/88,  pulse 92, temperature 98.3 F (36.8 C), temperature source Oral, resp. rate 20, height 5' 1.5" (1.562 m), weight 58.514 kg (129 lb).Body mass index is 23.98 kg/(m^2).  General Appearance: Casual  Eye Contact::  Good  Speech:  Clear and Coherent  Volume:  Normal  Mood:  "I'm not doing or feeling well emotionally"  Affect:  Non-Congruent  Thought Process:  Coherent and Intact  Orientation:  Full (Time, Place, and Person)  Thought Content:  Rumination  Suicidal Thoughts:  Yes.  without intent/plan  Homicidal Thoughts:  No  Memory:  Immediate;   Good Recent;   Good Remote;   Good  Judgement:  Fair  Insight:  Lacking  Psychomotor Activity:  Normal  Concentration:  Good  Recall:  Good  Fund of Knowledge:Fair  Language: Good  Akathisia:  No  Handed:  Right  AIMS (if indicated):     Assets:  Communication Skills Desire for Improvement  ADL's:  Intact  Cognition: WNL  Sleep:  Number of Hours: 5.5   Current Medications: Current Facility-Administered Medications  Medication Dose Route Frequency Provider Last Rate Last Dose  . alum & mag hydroxide-simeth (MAALOX/MYLANTA) 200-200-20 MG/5ML suspension 30 mL  30 mL Oral Q4H PRN Shuvon B Rankin, NP      . carisoprodol (SOMA) tablet 350 mg  350 mg Oral QHS Encarnacion Slates, NP   350 mg at 11/30/14 2235  . clonazePAM (KLONOPIN) tablet 0.5 mg  0.5 mg Oral TID PRN Encarnacion Slates, NP   0.5 mg at 12/01/14 0746  . FLUoxetine (PROZAC) capsule 40 mg  40 mg Oral Daily Ursula Alert, MD   40 mg at 12/01/14 0746  . levETIRAcetam (KEPPRA) tablet 500 mg  500 mg Oral BID Shuvon B Rankin, NP   500 mg at 12/01/14 0746  . lisinopril (PRINIVIL,ZESTRIL) tablet 10 mg  10 mg Oral Daily Encarnacion Slates, NP   10 mg at 12/01/14 0746  . magnesium hydroxide (MILK OF MAGNESIA) suspension 30 mL  30 mL Oral Daily PRN Shuvon B Rankin, NP      . multivitamin with minerals tablet 1 tablet  1 tablet Oral Daily Shuvon B Rankin, NP   1 tablet at 12/01/14 0746  .  neomycin-bacitracin-polymyxin (NEOSPORIN) ointment   Topical BID Niel Hummer, NP      . pantoprazole (PROTONIX) EC tablet 40 mg  40 mg Oral BID Shuvon B Rankin, NP   40 mg at 12/01/14 0746  . traZODone (DESYREL) tablet 50 mg  50 mg Oral QHS PRN Shuvon B Rankin, NP        Lab Results: No results found for this or any previous visit (from the past 48 hour(s)).  Physical Findings: AIMS: Facial and Oral Movements Muscles of Facial Expression: None, normal Lips and Perioral Area: None, normal Jaw: None, normal Tongue: None, normal,Extremity Movements Upper (  arms, wrists, hands, fingers): None, normal Lower (legs, knees, ankles, toes): None, normal, Trunk Movements Neck, shoulders, hips: None, normal, Overall Severity Severity of abnormal movements (highest score from questions above): None, normal Incapacitation due to abnormal movements: None, normal Patient's awareness of abnormal movements (rate only patient's report): No Awareness, Dental Status Current problems with teeth and/or dentures?: Yes (broken teeth) Does patient usually wear dentures?: No  CIWA:    COWS:     Treatment Plan Summary: Daily contact with patient to assess and evaluate symptoms and progress in treatment and Medication management: 1. Continue crisis management, mood stabilization & relapse prevention.. 2. Continue current medication management to reduce current symptoms to base line and improve the  patient's overall level of functioning; Continue Fluoxetine 40 mg for depression, increased Klonopin 0.5 mg tid for severe anxiety,  Soma 350 mg Q bedtime insomnia, Trazodone 50 mg for insomnia. 3. Treat health problems as indicated; Increased Lisinopril to 20 mg for HTN, protonix 40 mg for GERD. Keppra 500 mg for seizures, discontinue Skelaxin, since Manuela Neptune has been reinstated.  4. Develop treatment plan to enhance medication adeherance upon discharge and the need for  readmission. 5. Psycho-social education regarding  relapse prevention and self care.  Medical Decision Making:  Established Problem, Stable/Improving (1), Review of Psycho-Social Stressors (1), Review of Medication Regimen & Side Effects (2) and Review of New Medication or Change in Dosage (2)  Encarnacion Slates, PMHNp-BC 12/01/2014, 2:39 PM I agree with assessment and plan Geralyn Flash A. Sabra Heck, M.D.I agree with assessment and plan Geralyn Flash A. Sabra Heck, M.D.

## 2014-12-01 NOTE — Progress Notes (Signed)
Patient ID: [name], female   DOB: 1965/09/23, 49 y.o.   MRN: [id number]  DAR: Pt. Denies SI/HI and A/V Hallucinations. Patient reports pain that is chronic. Patient offered non-pharm interventions but she refused. She kept repeating, "we need to do something about this pain medication" although it was communicated to writer by staff that pain medications have already been discussed with patient. Patient is unwilling to participate at times. Patient signed ANOTHER 72 hour request for discharge and was upset about this because she had rescinded her last one earlier this morning and said, "I'll be in my room for the next 72 hours." Patient came out later in the afternoon and is now seen in the day room interacting with peers and enjoying snack. Support and encouragement provided to the patient. Scheduled medications administered to patient per physician's orders. Patient received PRN Klonopin this morning and she reported relief. Patient is receptive and cooperative. Q15 minute checks are maintained for safety.

## 2014-12-01 NOTE — Progress Notes (Signed)
Patient ID: [name], female   DOB: 13-Sep-1966, 49 y.o.   MRN: [id number]  Patient signed NEW 72 hour request 12/01/2014 at 1050 AM. Patient is upset about this because she has to be restarted on 72 hour but it was explained to her and she verbalized understanding.

## 2014-12-01 NOTE — Progress Notes (Signed)
Patient ID: [name], female   DOB: 01/06/1966, 49 y.o.   MRN: [id number]  Patient rescinded 72 hour request 12/01/2014 @ 0932

## 2014-12-01 NOTE — [facility] Group Notes (Signed)
[facility] Group Notes:  (Nursing/MHT/Case Management/Adjunct)  Date:  12/01/2014  Time:  1:14 PM  Type of Therapy:  Psychoeducational Skills  Participation Level:  Did Not Attend  Participation Quality:  Did Not Attend  Affect:  Did Not Attend  Cognitive:  Did Not Attend  Insight:  None  Engagement in Group:  Did Not Attend  Modes of Intervention:  Did Not Attend  Summary of Progress/Problems: Pt did not attend patient self inventory group.   Benancio Deeds Shanta 12/01/2014, 1:14 PM

## 2014-12-01 NOTE — Plan of Care (Signed)
Problem: Ineffective individual coping Goal: STG:Pt. will utilize relaxation techniques to reduce stress STG: Patient will utilize relaxation techniques to reduce stress levels  Outcome: Not Progressing At this time [name] is focused on medications rather than using her own coping skills to reduce stress levels.

## 2014-12-01 NOTE — B[facility] Group Notes (Signed)
[facility] Group Notes: (Clinical Social Work)   12/01/2014      Type of Therapy:  Group Therapy   Participation Level:  Did Not Attend despite MHT prompting   Selmer Dominion, LCSW 12/01/2014, 3:03 PM

## 2014-12-02 DIAGNOSIS — R45851 Suicidal ideations: Secondary | ICD-10-CM

## 2014-12-02 DIAGNOSIS — F332 Major depressive disorder, recurrent severe without psychotic features: Principal | ICD-10-CM

## 2014-12-02 MED ORDER — FLUOXETINE HCL 20 MG PO CAPS
60.0000 mg | ORAL_CAPSULE | Freq: Every day | ORAL | Status: DC
  Administered 2014-12-03 – 2014-12-10 (×8): 60 mg via ORAL
  Filled 2014-12-02 (×13): qty 3

## 2014-12-02 MED ORDER — GABAPENTIN 100 MG PO CAPS
100.0000 mg | ORAL_CAPSULE | Freq: Three times a day (TID) | ORAL | Status: DC
  Administered 2014-12-02 – 2014-12-05 (×9): 100 mg via ORAL
  Filled 2014-12-02 (×16): qty 1

## 2014-12-02 MED ORDER — METHOCARBAMOL 500 MG PO TABS
500.0000 mg | ORAL_TABLET | Freq: Three times a day (TID) | ORAL | Status: DC | PRN
  Administered 2014-12-02 – 2014-12-09 (×15): 500 mg via ORAL
  Filled 2014-12-02 (×16): qty 1

## 2014-12-02 NOTE — Clinical Social Work Note (Signed)
CSW spoke with patient's supervisor, Marica Otter of [employer], who advised patient's job is secured and she can return when she is able.

## 2014-12-02 NOTE — Progress Notes (Addendum)
Patient ID: [name], female   DOB: 1966-06-26, 49 y.o.   MRN: [id number] [facility] MD Progress Note  12/02/2014 4:22 PM Kyan Giannone  MRN:  [id number]  Subjective:  Patient reports ongoing depression. She states that although she feels better enough to be more sociable and participative in milieu, " inside " still feels quite depressed, and continues to have ongoing, frequent passive SI.  Objective : I have discussed case with treatment team and have met with patient. As above, patient endorses ongoing depression, sense of anhedonia, difficulty finding " a purpose in life", and a sense of sadness. She does smile, and even laugh at times appropriately, and affect is reactive. She denies SI and does not appear internally preoccupied . She is visible in milieu, and attends groups regularly. Behavior on unit in good control and does contract for safety on unit at this time.  She denies medication side effects. She reports that improvement from meds has been partial, and she is pessimistic that she will feel " much better on them than  I feel already". Of note, patient states that ECT was extremely helpful back in January, at which time she had 5 ECTs done at [facility]. States she tolerated this treatment well without significant/severe  Memory loss. She is hoping to restart ECT again.   Principal Problem: MDD (major depressive disorder), recurrent severe, without psychosis Diagnosis:   Patient Active Problem List   Diagnosis Date Noted  . MDD (major depressive disorder), recurrent severe, without psychosis [F33.2] 11/29/2014  . Panic disorder [F41.0] 11/29/2014  . Agoraphobia [F40.00] 11/29/2014  . ARF (acute renal failure) [N17.9] 11/25/2014  . Intentional acetaminophen overdose [T39.1X2A] 10/26/2014  . Major depressive disorder, recurrent, severe without psychotic features [F33.2]   . Insomnia [G47.00]   . Chronic pain syndrome [G89.4]   . Bipolar 1 disorder, mixed, severe [F31.63] 10/09/2014  .  Acute blood loss anemia [D62] 10/07/2014  . Liver failure, acute [K72.00]   . GI bleed [K92.2] 10/06/2014  . Duodenal ulcer hemorrhage [K26.4] 10/06/2014  . Vomiting [R11.10] 10/05/2014  . Acute hepatitis [K72.00] 10/05/2014  . Acute renal insufficiency [N28.9] 10/05/2014  . Seizure disorder [G40.909] 10/05/2014  . Hyperammonemia [E72.20] 10/05/2014  . Suicidal ideation [R45.851] 10/05/2014  . Bipolar disorder [F31.9] 10/01/2014  . Major depression [F32.2] 10/01/2014  . Nausea and vomiting [R11.2] 09/30/2014  . Diarrhea [R19.7] 09/30/2014  . SIRS (systemic inflammatory response syndrome) [A41.9] 07/15/2014  . Tylenol overdose [T39.1X4A] 07/13/2014  . Hypokalemia [E87.6] 07/13/2014   Total Time spent with patient: 35 minutes  Past Medical History:  Past Medical History  Diagnosis Date  . Migraines   . Endometriosis   . ETOH abuse     sober 3 1/2 years  . Anorexia   . Depression   . Anxiety   . DJD (degenerative joint disease) of cervical spine   . Seizures   . PICC (peripherally inserted central catheter) in place     rt neck    Past Surgical History  Procedure Laterality Date  . Knee surgery    . Abdominal surgery    . Cholecystectomy    . Appendectomy    . Abdominal hysterectomy    . Nasal sinus surgery    . Laproscopy    . Carpal tunnel release      2010  . Esophagogastroduodenoscopy N/A 10/06/2014    Procedure: ESOPHAGOGASTRODUODENOSCOPY (EGD);  Surgeon: Inda Castle, MD;  Location: [place];  Service: Endoscopy;  Laterality: N/A;   Family History:  Family History  Problem Relation Age of Onset  . Hypertension Mother   . Hyperlipidemia Mother   . Heart failure Father   . Cancer Other    Social History:  History  Alcohol Use No     History  Drug Use No    History   Social History  . Marital Status: Single    Spouse Name: N/A  . Number of Children: N/A  . Years of Education: N/A   Social History Main Topics  . Smoking status: Current  Every Day Smoker -- 0.25 packs/day for 20 years    Types: Cigarettes  . Smokeless tobacco: Never Used  . Alcohol Use: No  . Drug Use: No  . Sexual Activity: Not on file   Other Topics Concern  . None   Social History Narrative   Additional History:    Sleep: improved   Appetite:  Fair  Assessment:   Musculoskeletal: Strength & Muscle Tone: within normal limits Gait & Station: normal Patient leans: N/A  Psychiatric Specialty Exam: Physical Exam  Review of Systems  Constitutional: Negative for fever and chills.  Respiratory: Negative for cough and shortness of breath.   Cardiovascular: Negative for chest pain.  Gastrointestinal: Negative for vomiting and abdominal pain.  Psychiatric/Behavioral: Positive for depression and suicidal ideas.    Blood pressure 136/81, pulse 76, temperature 98 F (36.7 C), temperature source Oral, resp. rate 18, height 5' 1.5" (1.562 m), weight 129 lb (58.514 kg).Body mass index is 23.98 kg/(m^2).  General Appearance: Well Groomed  Eye Contact::  Good  Speech:  Clear and Coherent  Volume:  Decreased  Mood:  reports ongoing depression. Affect is constricted, but reactive and does smile at times appropriately  Affect:  Non-Congruent and constricted but reactive  Thought Process:  Coherent and Intact  Orientation:  Full (Time, Place, and Person)  Thought Content:  Rumination- denies hallucinations, no delusions   Suicidal Thoughts:  Yes.  without intent/plan- describes chronic and ongoing passive SI but denies plan or intent of hurting self and contracts for safety on the unit at this time.  Homicidal Thoughts:  No  Memory:  Recent and remote grossly intact   Judgement:  Fair  Insight:  Present  Psychomotor Activity:  Normal  Concentration:  Good  Recall:  Good  Fund of Knowledge:Fair  Language: Good  Akathisia:  No  Handed:  Right  AIMS (if indicated):     Assets:  Communication Skills Desire for Improvement  ADL's:  Intact   Cognition: fair   Sleep:  Number of Hours: 6   Current Medications: Current Facility-Administered Medications  Medication Dose Route Frequency Provider Last Rate Last Dose  . acetaminophen (TYLENOL) tablet 650 mg  650 mg Oral Once Encarnacion Slates, NP   650 mg at 12/01/14 1512  . alum & mag hydroxide-simeth (MAALOX/MYLANTA) 200-200-20 MG/5ML suspension 30 mL  30 mL Oral Q4H PRN Shuvon B Rankin, NP      . clonazePAM (KLONOPIN) tablet 0.5 mg  0.5 mg Oral TID PRN Encarnacion Slates, NP   0.5 mg at 12/02/14 1306  . [START ON 12/03/2014] FLUoxetine (PROZAC) capsule 60 mg  60 mg Oral Daily Fernando A Cobos, MD      . gabapentin (NEURONTIN) capsule 100 mg  100 mg Oral TID Jenne Campus, MD   100 mg at 12/02/14 1324  . levETIRAcetam (KEPPRA) tablet 500 mg  500 mg Oral BID Shuvon B Rankin, NP   500 mg at 12/02/14 0819  .  lisinopril (PRINIVIL,ZESTRIL) tablet 20 mg  20 mg Oral Daily Encarnacion Slates, NP   20 mg at 12/02/14 0819  . magnesium hydroxide (MILK OF MAGNESIA) suspension 30 mL  30 mL Oral Daily PRN Shuvon B Rankin, NP      . methocarbamol (ROBAXIN) tablet 500 mg  500 mg Oral Q8H PRN Jenne Campus, MD   500 mg at 12/02/14 1324  . multivitamin with minerals tablet 1 tablet  1 tablet Oral Daily Shuvon B Rankin, NP   1 tablet at 12/02/14 5573  . neomycin-bacitracin-polymyxin (NEOSPORIN) ointment   Topical BID Niel Hummer, NP      . pantoprazole (PROTONIX) EC tablet 40 mg  40 mg Oral BID Shuvon B Rankin, NP   40 mg at 12/02/14 2202    Lab Results: No results found for this or any previous visit (from the past 48 hour(s)).  Physical Findings: AIMS: Facial and Oral Movements Muscles of Facial Expression: None, normal Lips and Perioral Area: None, normal Jaw: None, normal Tongue: None, normal,Extremity Movements Upper (arms, wrists, hands, fingers): None, normal Lower (legs, knees, ankles, toes): None, normal, Trunk Movements Neck, shoulders, hips: None, normal, Overall Severity Severity of  abnormal movements (highest score from questions above): None, normal Incapacitation due to abnormal movements: None, normal Patient's awareness of abnormal movements (rate only patient's report): No Awareness, Dental Status Current problems with teeth and/or dentures?: Yes (broken teeth) Does patient usually wear dentures?: No  CIWA:    COWS:      Assessment- at this time patient remains depressed, and states her depression is chronic and entrenched. Describes some ongoing passive SI. Behavior on unit in good control, no self injurious behaviors noted, and going to groups/participating in milieu. Wants to get ECT treatment based on significant improvement during recent treatment course 2 months ago and minimal side effects.   Treatment Plan Summary: Daily contact with patient to assess and evaluate symptoms and progress in treatment and Medication management:  Agrees to D/C Soma, due to abuse potential, drug drug interaction potential. Robaxin PRNS for  muscular pain- patient states she has been on this medication without side effects Increase Prozac to 60 mgrs QDAY Start Neurontin 100 mgrs  TID  for pain and also to help with anxiety. Patient on Keppra - she reports  a history of seizures. Will work with team regarding possible transfer to [facility] in order to receive ECT course/trial.   Medical Decision Making:  Established Problem, Stable/Improving (1), Review of Psycho-Social Stressors (1), Review of Medication Regimen & Side Effects (2) and Review of New Medication or Change in Dosage (2)  COBOS, FERNANDO,  12/02/2014, 4:22 PM

## 2014-12-02 NOTE — Progress Notes (Signed)
Writer has observed patient up in the dayroom this evening reading a book occasionally and talking with peers. Writer spoke with her 1:1 and she c/o the NP she saw on today and was not happy b/c she feels that her pain is not being addressed with proper pain medication. She wants to talk with Dr. Sabra Heck and feels that he will understand her and give her what she needs since he knows her history from the past times he has been her doctor. She reports passive si and  And verbally contracts for safety, denies hi/a/v hallucinations. Patient was encouraged to speak with her doctor on tomorrow and she reportedd that she wasn't sure about her doctor as well.

## 2014-12-02 NOTE — Progress Notes (Signed)
[place] Group Notes:  (Nursing/MHT/Case Management/Adjunct)  Date:  12/02/2014  Time:  12:17 AM  Type of Therapy:  Psychoeducational Skills  Participation Level:  Active  Participation Quality:  Appropriate  Affect:  Angry and Irritable  Cognitive:  Disorganized  Insight:  Lacking  Engagement in Group:  Monopolizing  Modes of Intervention:  Education  Summary of Progress/Problems: The patient shared with the group that she didn't have a very good day since she continues to have a disagreement with the nurse practitioner. She feels that the wrong medication has been prescribed for her. She is presently disagreeing with the decision not to give her Tylenol at this time. The patient talked out of order and was semi disruptive in group. Her goal for tomorrow is to speak with the doctor about her medication.   Archie Balboa S 12/02/2014, 12:17 AM

## 2014-12-02 NOTE — B[facility] Group Notes (Signed)
[facility] LCSW Group Therapy          Overcoming Obstacles       1:15 -2:30        12/02/2014       Type of Therapy:  Group Therapy  Participation Level:  Appropriate  Participation Quality:  Appropriate  Affect:  Depressed  Cognitive:  Appropriate  Insight: Developing/Improving  Engagement in Therapy: Developing/Imprvoing   Modes of Intervention:  Discussion Exploration  Education Rapport BuildingProblem-Solving Support  Summary of Progress/Problems:  The main focus of today's group was overcoming obstacles.  Patient shared the obstacle she needs to overcome is depression.  She shared it is difficult to find a reason to continue to gon on.  Patient was able to state her mother is a reason to stay alive.  Patient able to identify appropriate coping skills including following up with outpatient recommendations.Concha Pyo 12/02/2014

## 2014-12-02 NOTE — Plan of Care (Signed)
Problem: Ineffective individual coping Goal: STG-Increase in ability to manage activities of daily living Outcome: Completed/Met Date Met:  12/02/14 Patient able to perform ADL's     

## 2014-12-02 NOTE — B[facility] Group Notes (Signed)
[facility] LCSW Aftercare Discharge Planning Group Note   12/02/2014 10:41 AM    Participation Quality:  Appropraite  Mood/Affect:  Appropriate  Depression Rating:  6  Anxiety Rating:  9  Thoughts of Suicide:  No  Will you contract for safety?   NA  Current AVH:  No  Plan for Discharge/Comments:  Patient attended discharge planning group and actively participated in group. Patient advised of feeling a little better today. She will follow up with [facility] and [facility] for outpatient services.  Suicide prevention education reviewed and SPE document provided.   Transportation Means: Patient has transportation.   Supports:  Patient has a support system.   Tykisha Areola, [place] Post

## 2014-12-02 NOTE — Progress Notes (Signed)
Recreation Therapy Notes  Date: 03.14.2016 Time: 9:30am Location: [address] Group Room   Group Topic: Stress Management  Goal Area(s) Addresses:  Patient will actively participate in stress management techniques presented during session.   Behavioral Response: Engaged, Attentive  Intervention: Stress management techniques  Activity :  Guided Imagery, Facial Massage. Patients were asked to visualize a sunrise at the beach while listening to a soundtrack of the ocean. Patients were then asked to participate in neck and facial massage used to help release tension.   Education:  Stress Management, Discharge Planning.   Education Outcome: Acknowledges edcuation  Clinical Observations/Feedback: Patient actively engaged in both techniques, expressed no issues and displayed ability to practice independently post d/c.   Laureen Ochs Terena Bohan, LRT/CTRS  Lysandra Loughmiller L 12/02/2014 2:28 PM

## 2014-12-02 NOTE — Progress Notes (Addendum)
D: Pt presents bright in affect and anxious in mood. Pt reports recent SI with no plan developed. Pt verbally contracts for safety. Pt is visible and active within the milieu. Pt is currently denying any HI/AVH. Pt is hoping to get a good night rest with the removal of her Soma.  A: Writer prn medications to pt, per MD orders. Continued support and availability as needed was extended to this pt. Staff continue to monitor pt with q44min checks.  R: No adverse drug reactions noted. Pt receptive to treatment. Pt remains safe at this time.

## 2014-12-02 NOTE — Progress Notes (Signed)
Patient ID: [name], female   DOB: 1966-03-01, 49 y.o.   MRN: [id number]  DAR: Pt. Denies SI/HI and A/V Hallucinations. Patient reports lower back pain that is constant and ongoing. Patient received PRN Robaxin and reported minimal relief. Patient reports sleeping fair, appetite is fair, energy level is low, and concentration is poor. Patient rates depression at 6/10, anxiety 7/10 and hopelessness at 8/10. Patient reports, "I'm not having a good day." Support and encouragement provided to the patient. Scheduled medications administered to patient per physician's orders. Patient received PRN Klonopin and reported relief on reassessment. Patient is receptive and cooperative with Probation officer but continues to be sarcastic at times. Patient is seen in the milieu interacting with peers and is attending some groups. Q15 minute checks are maintained for safety.

## 2014-12-03 MED ORDER — QUETIAPINE FUMARATE 25 MG PO TABS
25.0000 mg | ORAL_TABLET | Freq: Every day | ORAL | Status: DC
  Administered 2014-12-03 – 2014-12-05 (×3): 25 mg via ORAL
  Filled 2014-12-03 (×5): qty 1

## 2014-12-03 MED ORDER — ISOMETHEPTENE-APAP-DICHLORAL 65-325-100 MG PO CAPS
1.0000 | ORAL_CAPSULE | Freq: Four times a day (QID) | ORAL | Status: DC | PRN
  Administered 2014-12-03 – 2014-12-10 (×14): 1 via ORAL
  Filled 2014-12-03 (×14): qty 1
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
## 2014-12-03 NOTE — Progress Notes (Signed)
D: Patient presents with flat, blunted affect and depressed mood. She reported on the self inventory sheet that sleep, appetite and ability to concentrate are all poor and energy level is low. Patient rates depression/feelings of hopelessness "8" and anxiety "7". She's actively participating in groups and visible in the milieu; voicing that her goal is to not isolate to the room. Complains of migraine; writer informed [place]os, MD; currently patient does not have an active order to receive medication for migraine. Adheres to medication regimen.   A: Support and encouragement provided to patient. Administered medications per ordering MD. Monitor Q15 minute checks for safety.  R: Patient receptive. Endorses passive SI, but contracts for safety. Denies HI and AVH. Patient remains safe on the unit.
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
## 2014-12-03 NOTE — Progress Notes (Signed)
Pt attended spiritual care group on grief and loss facilitated by chaplain Jerene Pitch and counseling intern [place] Byrdie Miyazaki. Group opened with brief discussion and psycho-social ed around grief and loss in relationships and in relation to self - identifying life patterns, circumstances, changes that cause losses. Established group norm of speaking from own life experience. Group goal of establishing open and affirming space for members to share loss and experience with grief, normalize grief experience and provide psycho social education and grief support.  Group drew on narrative and Alderian therapeutic modalities.   Sirenity was present throughout group and returned after after stepping out for treatment team. Became tearful after returning to group and could not contribute to discussion. After group facilitators checked in as she continued to cry, and Sumire said she was too overwhelmed to talk right now but appreciated the support.   [place] Delcia Spitzley Counseling Intern

## 2014-12-03 NOTE — Tx Team (Addendum)
Interdisciplinary Treatment Plan Update   Date Reviewed:  12/03/2014  Time Reviewed:  10:18 AM  Progress in Treatment:   Attending groups:Patient is attending groups. Participating in groups: Patient engages in discussions. Taking medication as prescribed: Yes  Tolerating medication: Yes Family/Significant other contact made:  Yes, collateral contact with sister but patient would not consent for sister to receive SPE. Patient understands diagnosis: Yes, patient understands diagnosis and need for treatment. Discussing patient identified problems/goals with staff: Yes, patient is able to express goals for treatment and discharge. Medical problems stabilized or resolved: Yes Denies suicidal/homicidal ideation: No Patient has not harmed self or others: Yes  For review of initial/current patient goals, please see plan of care.  Estimated Length of Stay:  3-5 days  Reasons for Continued Hospitalization:  Anxiety Depression Medication stabilization Suicidal ideation  New Problems/Goals identified:    Discharge Plan or Barriers:   Referral to be made to [facility] for ECT.  Additional Comments:    [name] is a 49 y.o. female with history of depression, seizure, chronic pain who was admitted last month for Tylenol overdose was brought to the ER after patient had taken intentionally overdose of Tylenol since today morning. Patient states she has been having chronic pain and she was depressed and suicidal and started taking Tylenol intentionally to commit suicide. Patient has taken multiple doses of Tylenol. She also has been having nausea vomiting since morning. She also had fallen and hit her head when she was walking and tripped on her dog. In the ER CT head shows some contusion.  Patient and CSW reviewed patient's identified goals and treatment plan.  Patient verbalized understanding and agreed to treatment plan.   Attendees:  Patient:  12/03/2014 10:18 AM   Signature:  Gabriel Earing, MD  12/03/2014 10:18 AM  Signature: Carlton Adam, MD 12/03/2014 10:18 AM  Signature:  Mayra Neer, RN 12/03/2014 10:18 AM  Signature:  Eduard Roux, RN 12/03/2014 10:18 AM  Signature:   12/03/2014 10:18 AM  Signature:  Joette Catching, LCSW 12/03/2014 10:18 AM  Signature:  Erasmo Downer Drinkard, LCSW-A 12/03/2014 10:18 AM  Signature 12/03/2014 10:18 AM  Signature:   12/03/2014 10:18 AM  Signature: 12/03/2014  10:18 AM  Signature:   Lars Pinks, RN [facility] 12/03/2014  10:18 AM  Signature:   12/03/2014  10:18 AM    Scribe for Treatment Team:   Joette Catching,  12/03/2014 10:18 AM

## 2014-12-03 NOTE — [facility] Group Notes (Signed)
[facility] LCSW Group Therapy      Feelings About Diagnosis 1:15 - 2:30 PM         12/03/2014    Type of Therapy:  Group Therapy  Participation Level:  Active  Participation Quality:  Appropriate  Affect:  Depressed, Tearful  Cognitive:  Appropriate  Insight:  Developing/Improving and Engaged  Engagement in Therapy:  Developing/Improving and Engaged  Modes of Intervention:  Discussion, Education, Exploration, Problem-Solving, Rapport Building, Support  Summary of Progress/Problems:  Patient actively participated in group. Patient discussed past and present diagnosis and the effects it has had on  life.  She shared having depression feels like a death sentence.  She talked about the lack of support from family who thinks she is seeking attention and weak.  Concha Pyo 12/03/2014

## 2014-12-03 NOTE — Progress Notes (Signed)
Adult Psychoeducational Group Note  Date:  12/03/2014 Time:  9:00 AM  Group Topic/Focus:  Recovery Goals:   The focus of this group is to identify appropriate goals for recovery and establish a plan to achieve them.  Participation Level:  Active  Participation Quality:  Appropriate and Attentive  Affect:  Appropriate  Cognitive:  Alert and Oriented  Insight: Appropriate  Engagement in Group:  Engaged  Modes of Intervention:  Discussion  Additional Comments: Patient's goal is to break this cycle of hospitalizations; she voiced that she's been in the hospital at least once a month since last October.  Eduard Roux E 12/03/2014, 10:24 AM

## 2014-12-03 NOTE — Clinical Social Work Note (Signed)
CSW and MD met with patient to follow up on treatment planning.  Patient advised of not making much progress.  She continues to have SI and states she does not have anything to hold on to.  She shared since her father died her depression has worsened.  She reports having four serious suicide attempts since October 2015.  Patient is requesting to be referred for ECT.  She reports having five sessions of ECT recently and that she did well until she discharged home and got "inside of her head."  CSW contact [facility] and left message of interest in referring patient for treatment.

## 2014-12-03 NOTE — Clinical Social Work Note (Signed)
Clinicals faxed to [facility] for ECT.  Awaiting response.

## 2014-12-03 NOTE — Progress Notes (Signed)
D:Patient in the hallway on first approach.  Patient states her day was ok.  Patient states she feels like she needs more work.  Patient states she hopes she can get ECT at [facility].  Patient states she has been talking to the doctor about it.  Patient states she finally has been able to get rid if her migraines.  Patient also rescinded her 72 hour request for discharge. A: Staff to monitor Q 15 mins for safety.  Encouragement and support offered.  Scheduled medications administered per orders.  Midrin administered prn for headache. R: Patient remains safe on the unit.  Patient attended group tonight.  Patient visible on the unit and interacting with peers.  Patient taking administered medications.

## 2014-12-03 NOTE — Progress Notes (Signed)
Recreation Therapy Notes  Animal-Assisted Activity/Therapy (AAA/T) Program Checklist/Progress Notes Patient Eligibility Criteria Checklist & Daily Group note for Rec Tx Intervention  Date: 03.15.2016 Time: 2:45am  Location: [address]    AAA/T Program Assumption of Risk Form signed by Patient/ or Parent Legal Guardian yes  Patient is free of allergies or sever asthma yes  Patient reports no fear of animals yes  Patient reports no history of cruelty to animals yes  Patient understands his/her participation is voluntary yes  Patient washes hands before animal contact yes  Patient washes hands after animal contact yes  Behavioral Response: Appropriate   Education: Hand Washing, Appropriate Animal Interaction   Education Outcome: Acknowledges education.   Clinical Observations/Feedback: Patient engaged appropriately with peers in session and therapy dog, petting him appropriately from floor level.   [name], LRT/CTRS  Lane Hacker 12/03/2014 5:14 PM

## 2014-12-04 ENCOUNTER — Encounter (HOSPITAL_COMMUNITY): Payer: Self-pay | Admitting: *Deleted

## 2014-12-04 MED ORDER — LORAZEPAM 2 MG/ML IJ SOLN
INTRAMUSCULAR | Status: AC
  Filled 2014-12-04: qty 1

## 2014-12-04 MED ORDER — LORAZEPAM 2 MG/ML IJ SOLN
2.0000 mg | Freq: Once | INTRAMUSCULAR | Status: AC
Start: 2014-12-04 — End: 2014-12-04
  Administered 2014-12-04: 2 mg via INTRAMUSCULAR

## 2014-12-04 NOTE — Progress Notes (Signed)
D: Patient has blunted, depressed affect and anxious mood; brighter today as opposed to the previous shift. She reported on the self inventory sheet that her sleep and ability to concentrate are both poor, fair appetite and low energy level. Patient rates depression "7", feelings of hopelessness "6" and anxiety "8". Writer has observed that the patient has been sitting in the dayroom the majority of the day, conversing with peers. In compliance with all medications and tolerating them well.  A: Support and encouragement provided to patient. Scheduled medications given per MD orders. Maintain Q15 minute checks for safety.  R: Patient receptive. Denies SI/HI and AVH. Patient remains safe on the hall.

## 2014-12-04 NOTE — Progress Notes (Signed)
Nursing 1:1 note  D: Patient resting in bed with eyes closed.  Respirations even and unlabored.  Patient appears to be in no apparent distress. A: Staff to monitor Q 15 mins for safety.   R:Patient remains safe on the unit.

## 2014-12-04 NOTE — Treatment Plan (Signed)
No one at [facility] reported to EMS that pt was hanging and then had a seizure.  BH is not sure how EMS got this information.  Pt with a seizure history and on Kepra had a seizure while talking to staff at the nurses station.

## 2014-12-04 NOTE — Progress Notes (Signed)
1:1 notes initiated on the paper progress notes at the patient bedside

## 2014-12-04 NOTE — ED Notes (Signed)
Bed: WA23 Expected date:  Expected time:  Means of arrival:  Comments: EMS 

## 2014-12-04 NOTE — Progress Notes (Signed)
Pt stated that her day started off good, but got rough as the day went on. PT got an upsetting phone call from her sister, and attempted to hurt herself.

## 2014-12-04 NOTE — BHH Group Notes (Signed)
[place] LCSW Group Therapy  Emotional Regulation 1:15 - 2: 30 PM        12/04/2014     Type of Therapy:  Group Therapy  Participation Level:  Appropriate  Participation Quality:  Appropriate  Affect:  Appropriate  Cognitive:  Attentive Appropriate  Insight:  Developing/Improving Engaged  Engagement in Therapy:  Developing/Improving Engaged  Modes of Intervention:  Discussion Exploration Problem-Solving Supportive  Summary of Progress/Problems:  Group topic was emotional regulations.  Patient participated in the discussion and was able to identify an emotion that needed to regulated as fear and disappointment.  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  Patient able to understand that failure to return to school is failing because she will not know whether or not she could have succeeded.  [name] 12/04/2014

## 2014-12-04 NOTE — ED Notes (Signed)
Pt states she became agitated after receiving a message from her sister that her mother's nursing home payment was late.  Patient states the unit was "chaotic," because there were several discharges today.  Patient states she approached several people asking, "Do you have a minute," and they did not.  Patient states she then returned to her room.  Patient states she was not hanging from anything, she took a sheet and wrapped it around her neck, tightening it until she "gurgled."  Patient states she engaged in that activity for @ 1 hour before approaching the nurses' station at [facility] whereupon she had a seizure. Pt c/o of s sore throat.  On exam, patient has some petechiae and bruising to neck, >L side, as well as petechiae under eyes bilaterally.  No petechiae noted to sclera.  No visible external swelling noted.  Trachea is midline and patient is swallowing without difficulty.  No swelling noted to patient's oropharynx.  No stridor noted.

## 2014-12-04 NOTE — Progress Notes (Signed)
Patient was near nursing station when she seemed to become dizzy and confused. She was eased to the floor by Nursing staff. She did not have head trauma or other trauma ( no fall ) . She then developed a grand mal tonic clonic seizure which was witnessed by this Conservation officer, nature. It lasted about three minutes . No tongue/lip bite noted, no bleeding.  She was given Ativan Im 2 mgrs x 1. She regained consciousness and seizure activity ceased. She did recognize me, and knew she was at [facility]. She was able to follow simple instructions such as squeezing hand, opening eyes. She did not appear to be focalized and moved all extremities.  She was still somewhat confused, bewildered following ictal episode. Of note, patient has a known history of Seizure Disorder and is on Keppra. She was seen by EMS who were called to unit , and was transported to [facility] ED.   Neita Garnet, MD

## 2014-12-04 NOTE — ED Notes (Signed)
[facility] called for transport.

## 2014-12-04 NOTE — ED Provider Notes (Signed)
CSN: [id number]     Arrival date & time 12/04/14  1618 History   First MD Initiated Contact with Patient 12/04/14 1711     Chief Complaint  Patient presents with  . Seizures     (Consider location/radiation/quality/duration/timing/severity/associated sxs/prior Treatment) HPI Patient reportedly had grand mal seizure today while at [facility] behavioral health. Prior to the seizure she had attempted to choke herself with a bed sheet. She presently complains of mild headache. No other complaint. Typical pain she gets after a seizure. No other associated symptoms. Past Medical History  Diagnosis Date  . Migraines   . Endometriosis   . ETOH abuse     sober 3 1/2 years  . Anorexia   . Depression   . Anxiety   . DJD (degenerative joint disease) of cervical spine   . Seizures   . PICC (peripherally inserted central catheter) in place     rt neck   Past Surgical History  Procedure Laterality Date  . Knee surgery    . Abdominal surgery    . Cholecystectomy    . Appendectomy    . Abdominal hysterectomy    . Nasal sinus surgery    . Laproscopy    . Carpal tunnel release      2010  . Esophagogastroduodenoscopy N/A 10/06/2014    Procedure: ESOPHAGOGASTRODUODENOSCOPY (EGD);  Surgeon: Inda Castle, MD;  Location: [facility];  Service: Endoscopy;  Laterality: N/A;   Family History  Problem Relation Age of Onset  . Hypertension Mother   . Hyperlipidemia Mother   . Heart failure Father   . Cancer Other    History  Substance Use Topics  . Smoking status: Current Every Day Smoker -- 0.25 packs/day for 20 years    Types: Cigarettes  . Smokeless tobacco: Never Used  . Alcohol Use: No   OB History    No data available     Review of Systems  Neurological: Positive for seizures and headaches.  Psychiatric/Behavioral: Positive for dysphoric mood.  All other systems reviewed and are negative.     Allergies  Aspirin; Doxycycline; Imitrex; Iodinated diagnostic agents; Lidocaine;  Prednisone; Sulfa antibiotics; Sulfasalazine; Sumatriptan; Tramadol; Levofloxacin; Latex; Levofloxacin; Metrizamide; Nsaids; and Tolmetin  Home Medications   Prior to Admission medications   Medication Sig Start Date End Date Taking? Authorizing Provider  busPIRone (BUSPAR) 7.5 MG tablet Take 1 tablet (7.5 mg total) by mouth 2 (two) times daily. 10/19/14  Yes Knox Royalty, NP  Calcium Carbonate-Vitamin D (CALCIUM-VITAMIN D3 PO) Take 1 capsule by mouth daily.   Yes Historical Provider, MD  carisoprodol (SOMA) 350 MG tablet Take 1 tablet (350 mg total) by mouth at bedtime. Patient taking differently: Take 350 mg by mouth 2 (two) times daily.  10/19/14  Yes Knox Royalty, NP  clonazePAM (KLONOPIN) 1 MG tablet Take 1 tablet (1 mg total) by mouth 3 (three) times daily as needed (anxiety). 10/19/14  Yes Knox Royalty, NP  diphenhydrAMINE (BENADRYL) 25 MG tablet Take 25 mg by mouth every 6 (six) hours as needed for itching or allergies.   Yes Historical Provider, MD  FLUoxetine (PROZAC) 20 MG capsule Take 1 capsule (20 mg total) by mouth daily. 10/19/14  Yes Knox Royalty, NP  gabapentin (NEURONTIN) 100 MG capsule Take 1 capsule (100 mg total) by mouth 3 (three) times daily. 10/19/14  Yes Knox Royalty, NP  levETIRAcetam (KEPPRA) 500 MG tablet Take 1 tablet (500 mg total) by mouth 2 (two) times daily. 10/19/14  Yes Knox Royalty, NP  Multiple Vitamin (MULTIVITAMIN WITH MINERALS) TABS tablet Take 1 tablet by mouth daily.   Yes Historical Provider, MD  pantoprazole (PROTONIX) 40 MG tablet Take 1 tablet (40 mg total) by mouth 2 (two) times daily. 10/19/14  Yes Knox Royalty, NP   BP 121/94 mmHg  Pulse 97  Temp(Src) 98.2 F (36.8 C) (Oral)  Resp 18  Ht 5' 1.5" (1.562 m)  Wt 129 lb (58.514 kg)  BMI 23.98 kg/m2  SpO2 95% Physical Exam  Constitutional: She is oriented to person, place, and time. She appears well-developed and well-nourished. No distress.  Healing abrasion to forehead, old appearing  otherwise normal cephalic atraumatic  HENT:  Head: Normocephalic and atraumatic.  Right Ear: External ear normal.  Left Ear: External ear normal.  Mouth/Throat: Oropharynx is clear and moist.  No hoarseness  Eyes: Conjunctivae are normal. Pupils are equal, round, and reactive to light.  Neck: Neck supple. No tracheal deviation present. No thyromegaly present.  Cardiovascular: Normal rate and regular rhythm.   No murmur heard. Pulmonary/Chest: Effort normal and breath sounds normal.  Abdominal: Soft. Bowel sounds are normal. She exhibits no distension. There is no tenderness.  Musculoskeletal: Normal range of motion. She exhibits no edema or tenderness.  Neurological: She is alert and oriented to person, place, and time. No cranial nerve deficit. Coordination normal.  Gait normal Romberg normal pronator drift normal  Skin: Skin is warm and dry. No rash noted.  Psychiatric: She has a normal mood and affect.  Nursing note and vitals reviewed.   ED Course  Procedures (including critical care time) Labs Review Labs Reviewed - No data to display  Imaging Review No results found.   EKG Interpretation None      MDM  Patient with known seizure disorder on Keppra. She is medically cleared for transport back to behavioral health Hospital Dx seizure disorder Final diagnoses:  None        Orlie Dakin, MD 12/04/14 1752

## 2014-12-04 NOTE — Progress Notes (Addendum)
D:Patient on the unit interacting with peers.  Patient states she was having a bad day.  Patient states she had a bad conversation on the phone with her sister.  Patient states no one was available for her to talk to so he she tried to choke herself with her sheets.  Patient states she then had a seizure and had to go back to the hospital.  Patient states she feels better now and she is bright and glad to back at [facility].  Patient on a 1:1 for safety. A: Staff to monitor Q 15 mins for safety.  Encouragement and support offered.  Scheduled medications administered per orders.  Midrin administered prn for a headache.   R: Patient remains safe on the unit.  Patient attended group tonight.  Patient visible on the unit and interacting with peers.  Patient taking administered medications.

## 2014-12-04 NOTE — Progress Notes (Addendum)
Patient approached Sharl Ma, RN around 3:40 verbalizing that she would like to speak with her doctor; per Sharl Ma, RN she informed the patient to first speak with her nurse. Writer walked over to speak with the patient regarding her concern. Suddenly, patient began to tilt over and writer assisted the patient to the floor before falling. At this time a "code" was called because the patient was actively having a seizure. Other RN staff called 911 and EMS was sent to the [facility]. Prior to EMS arriving the patient was given IM Ativan 2 mg at 1546. EMS arrived and patient was taken to the ED. The patient's sister, Jerlyn Ly was contacted and informed that her sister was being transported to [facility].

## 2014-12-04 NOTE — ED Notes (Signed)
Patient changed into wine scrubs.  Patient states that she is concerned about getting her mothers nursing home bill paid.  States that she went to the desk at [facility] and had a seizure.  C/o sore throat.

## 2014-12-04 NOTE — ED Notes (Signed)
Per EMS - patient was an IP at [facility] and attempted to hang herself.  Patient was disrupted by staff in that effort and shortly thereafter had a seizure.  Patient was assisted to floor from attempted hanging and did not fall.  Patient has bruising to neck and hoarse.  Patient's vitals WNL, BP 155/101, HR 100, 98% on RA.  CBG 89.  Patient received 2 mg Ativan IM at [facility].

## 2014-12-04 NOTE — BHH Group Notes (Signed)
[facility] LCSW Aftercare Discharge Planning Group Note   12/04/2014 10:06 AM    Participation Quality:  Appropraite  Mood/Affect:  Appropriate  Depression Rating:  7  Anxiety Rating:  7  Thoughts of Suicide:  No  Will you contract for safety?   NA  Current AVH:  No  Plan for Discharge/Comments:  Patient attended discharge planning group and actively participated in group. Referral made to [facility] for ECT. Suicide prevention education reviewed and SPE document provided.   Transportation Means: Patient has transportation.   Supports:  Patient has a support system.   Kiylah Loyer, [place]ulas Post

## 2014-12-04 NOTE — Progress Notes (Signed)
Recreation Therapy Notes  Date: 03.16.2016 Time: 9:30am  Location: [address] Group Room   Group Topic: Stress Management  Goal Area(s) Addresses:  Patient will actively participate in stress management techniques presented during session.   Behavioral Response: Engaged, Attentive  Intervention: Stress Management   Activity :  Patients were asked to color a mandala and listen to soothing music.   Education:  Stress Management, Discharge Planning.   Education Outcome: Acknowledges edcuation  Clinical Observations/Feedback: Patient actively engaged in stress management activity. Patient initially shared she does not prefer to engage in coloring as a stress management technique, despite admission patient recognized at conclusion of group she felt more relaxed than prior to group starting.   Laureen Ochs Adriel Kessen, LRT/CTRS  Westin Knotts L 12/04/2014 2:54 PM

## 2014-12-05 MED ORDER — GABAPENTIN 100 MG PO CAPS: 200.0000 mg | ORAL_CAPSULE | Freq: Two times a day (BID) | ORAL | Status: DC

## 2014-12-05 MED ORDER — GABAPENTIN 100 MG PO CAPS
200.0000 mg | ORAL_CAPSULE | Freq: Three times a day (TID) | ORAL | Status: DC
  Administered 2014-12-05 – 2014-12-09 (×12): 200 mg via ORAL
  Filled 2014-12-05 (×24): qty 2

## 2014-12-05 NOTE — Clinical Social Work Note (Signed)
CSW spoke with [name] in intake at [facility].  He advised Dr. Delcie Roch declined patient and recommended long term care at [facility].  MD advised.

## 2014-12-05 NOTE — Progress Notes (Signed)
Recreation Therapy Notes  Animal-Assisted Activity/Therapy (AAA/T) Program Checklist/Progress Notes Patient Eligibility Criteria Checklist & Daily Group note for Rec Tx Intervention  Date: 03.17.2016 Time: 2:45pm Location: [address]    AAA/T Program Assumption of Risk Form signed by Patient/ or Parent Legal Guardian yes  Patient is free of allergies or sever asthma yes  Patient reports no fear of animals yes  Patient reports no history of cruelty to animals yes  Patient understands his/her participation is voluntary yes  Behavioral Response: Did not attend.   Laureen Ochs Emonte Dieujuste, LRT/CTRS  Lane Hacker 12/05/2014 5:19 PM

## 2014-12-05 NOTE — Progress Notes (Signed)
Chaplain follow up for continued support.  [name] spoke with chaplain about tendency to spin in negative thoughts.  Related this to her history with family - described shame around family suicides, narrative of "not talking about it."  Stated it is difficult for her to ask for help when she needs it.    [place], [place]

## 2014-12-05 NOTE — Progress Notes (Signed)
1:1 Nursing Note  D: Patient sitting in the dayroom, actively participating in group with the MHT; no s/s of distress noted.  A: Maintain close observation and Q15 minute checks for safety.   R: Pt. receptive and remains safe on the hall.

## 2014-12-05 NOTE — Progress Notes (Signed)
D:Patient in the dayroom interacting with peers and making her coffee.  Patient states she slept great last night.  Patient bright and pleasant this morning.   A: Staff to monitor Q 15 mins for safety.  Encouragement and support offered.  Patient remains on 1:1 for safety. R: Patient remains safe on the unit.

## 2014-12-05 NOTE — Progress Notes (Signed)
1:1 Nursing Note  D: Patient is walking down the hall, conversing with the MHT/sitter at this time; even labored breathing; no s/s of distress noted.  A: Continuous observation of patient, along with 15 minute checks for safety.  R: Patient remains safe.

## 2014-12-05 NOTE — BHH Group Notes (Signed)
[place] LCSW Group Therapy  [facility] 1:15 - 2:30 PM  12/05/2014  .3:48 PM   Type of Therapy:  Group Therapy  Participation Level: Active  Participation Quality:  Attentive  Affect:  Tearful, Depressed  Cognitive:  Appropriate  Insight:  Developing/Improving   Engagement in Therapy:  Developing/Improving   Modes of Intervention:  Discussion, Education, Exploration, Problem-Solving, Rapport Building, Support   Summary of Progress/Problems:   Patient was attentive to speaker from the [employer] [facility] [employer] as he shared his story of dealing with mental health/substance abuse issues and overcoming it by working a recovery program.  She stated she really identifies with the speaker's story. Patient expressed interest in their programs and services and received information on their agency.  She advised of plans to follow up with the program at discharge.    Concha Pyo 12/05/2014 3:48 PM

## 2014-12-05 NOTE — Progress Notes (Signed)
Nursing 1:1 Note D: Patient resting in bed with eyes closed.  Respirations even and unlabored.  Patient appears to be in no apparent distress. A: Staff to monitor Q 15 mins for safety.  Patient remains on 1:1 for safety. R:Patient remains safe on the unit.

## 2014-12-05 NOTE — Progress Notes (Signed)
1:1 Nursing Note  D: Patient is eating dinner in the cafeteria and conversing amongst peers.  A: Maintaining Q15 minute checks and close observation of patient.  R: Still remains safe on the hall and unit.

## 2014-12-05 NOTE — Progress Notes (Addendum)
Patient ID: [name], female   DOB: 1966/03/01, 49 y.o.   MRN: [id number] [facility] MD Progress Note  12/05/2014 11:03 AM [name]  MRN:  [id number]  Subjective:   Patient reports she is regretful about yesterday's suicidal gesture/attempt. States " I guess I was not thinking of how it would affect other people". States she is no longer considering suicide, and makes statement that " I have not died yet, in spite of attempting suicide, so I guess God has some purpose for me". She is denying any sequelae- no headache, no confusion, but does state she has " sore muscles" after yesterday's seizure. At this time not endorsing any medication side effects. Objective : I have discussed case with treatment team and have met with patient. Yesterdy afternoon patient reportedly attempted to choke self with a noose fashioned from bed linen. She then desisted , came out to the hallway, but had a seizure , which lasted 2-3 minutes. She does have  A history of Seizure Disorder, and is on Keppra. She was then transferred to [facility] ED where work up was negative and she returned to unit. As above, today she is apologetic about yesterday's event . She states she feels bad about how other patients and staff may have felt. She reports being in " a better place " today, and was more amenable to discuss her positive interactions with others, such as helping out her sponsees and interacting with friends. She is currently calm, pleasant, appears less depressed, smiles more often, and at present is denying any suicidal ideations. Plan has been to consider transfer to [facility] for ECT , based on history of good response to this treatment option in the recent past. SW is looking into this option.       Principal Problem: MDD (major depressive disorder), recurrent severe, without psychosis Diagnosis:   Patient Active Problem List   Diagnosis Date Noted  . MDD (major depressive disorder), recurrent severe, without  psychosis [F33.2] 11/29/2014  . Panic disorder [F41.0] 11/29/2014  . Agoraphobia [F40.00] 11/29/2014  . ARF (acute renal failure) [N17.9] 11/25/2014  . Intentional acetaminophen overdose [T39.1X2A] 10/26/2014  . Major depressive disorder, recurrent, severe without psychotic features [F33.2]   . Insomnia [G47.00]   . Chronic pain syndrome [G89.4]   . Bipolar 1 disorder, mixed, severe [F31.63] 10/09/2014  . Acute blood loss anemia [D62] 10/07/2014  . Liver failure, acute [K72.00]   . GI bleed [K92.2] 10/06/2014  . Duodenal ulcer hemorrhage [K26.4] 10/06/2014  . Vomiting [R11.10] 10/05/2014  . Acute hepatitis [K72.00] 10/05/2014  . Acute renal insufficiency [N28.9] 10/05/2014  . Seizure disorder [G40.909] 10/05/2014  . Hyperammonemia [E72.20] 10/05/2014  . Suicidal ideation [R45.851] 10/05/2014  . Bipolar disorder [F31.9] 10/01/2014  . Major depression [F32.2] 10/01/2014  . Nausea and vomiting [R11.2] 09/30/2014  . Diarrhea [R19.7] 09/30/2014  . SIRS (systemic inflammatory response syndrome) [A41.9] 07/15/2014  . Tylenol overdose [T39.1X4A] 07/13/2014  . Hypokalemia [E87.6] 07/13/2014   Total Time spent with patient: 25 minutes  Past Medical History:  Past Medical History  Diagnosis Date  . Migraines   . Endometriosis   . ETOH abuse     sober 3 1/2 years  . Anorexia   . Depression   . Anxiety   . DJD (degenerative joint disease) of cervical spine   . Seizures   . PICC (peripherally inserted central catheter) in place     rt neck    Past Surgical History  Procedure Laterality Date  .  Knee surgery    . Abdominal surgery    . Cholecystectomy    . Appendectomy    . Abdominal hysterectomy    . Nasal sinus surgery    . Laproscopy    . Carpal tunnel release      2010  . Esophagogastroduodenoscopy N/A 10/06/2014    Procedure: ESOPHAGOGASTRODUODENOSCOPY (EGD);  Surgeon: Inda Castle, MD;  Location: [place];  Service: Endoscopy;  Laterality: N/A;   Family History:   Family History  Problem Relation Age of Onset  . Hypertension Mother   . Hyperlipidemia Mother   . Heart failure Father   . Cancer Other    Social History:  History  Alcohol Use No     History  Drug Use No    History   Social History  . Marital Status: Single    Spouse Name: N/A  . Number of Children: N/A  . Years of Education: N/A   Social History Main Topics  . Smoking status: Current Every Day Smoker -- 0.25 packs/day for 20 years    Types: Cigarettes  . Smokeless tobacco: Never Used  . Alcohol Use: No  . Drug Use: No  . Sexual Activity: Not on file   Other Topics Concern  . None   Social History Narrative   Additional History:    Sleep: improved   Appetite:   Improved   Assessment:   Musculoskeletal: Strength & Muscle Tone: within normal limits Gait & Station: normal Patient leans: N/A  Psychiatric Specialty Exam: Physical Exam  Review of Systems  Eyes: Negative.   Respiratory: Negative for cough and shortness of breath.   Cardiovascular: Negative for chest pain.  Skin: Negative for rash.  Neurological: Positive for seizures. Negative for dizziness, tremors, sensory change, speech change, focal weakness and headaches.  Psychiatric/Behavioral: Positive for depression and suicidal ideas.    Blood pressure 135/94, pulse 91, temperature 97.6 F (36.4 C), temperature source Oral, resp. rate 18, height 5' 1.5" (1.562 m), weight 129 lb (58.514 kg), SpO2 98 %.Body mass index is 23.98 kg/(m^2).  General Appearance: Well Groomed  Engineer, water::  Good  Speech:  Clear and Coherent  Volume:  Normal  Mood:   Today states she feels better, less depressed   Affect:  less constricted   Thought Process:  Linear  Orientation:  Full (Time, Place, and Person)  Thought Content:  No hallucinations, no delusions   Suicidal Thoughts:  No- at this time denies any ongoing SI, contracts for safety on unit. Had suicidal attempt yesterday- see above .  Homicidal Thoughts:   No  Memory:  Recent and remote grossly intact   Judgement:  Fair  Insight:  Present  Psychomotor Activity:  Normal  Concentration:  Good  Recall:  Good  Fund of Knowledge:Fair  Language: Good  Akathisia:  No  Handed:  Right  AIMS (if indicated):     Assets:  Communication Skills Desire for Improvement  ADL's:  Intact  Cognition: fair   Sleep:  Number of Hours: 6.75   Current Medications: Current Facility-Administered Medications  Medication Dose Route Frequency Provider Last Rate Last Dose  . acetaminophen (TYLENOL) tablet 650 mg  650 mg Oral Once Encarnacion Slates, NP   650 mg at 12/01/14 1512  . alum & mag hydroxide-simeth (MAALOX/MYLANTA) 200-200-20 MG/5ML suspension 30 mL  30 mL Oral Q4H PRN Shuvon B Rankin, NP      . clonazePAM (KLONOPIN) tablet 0.5 mg  0.5 mg Oral TID PRN Loleta Dicker  Nwoko, NP   0.5 mg at 12/05/14 1036  . FLUoxetine (PROZAC) capsule 60 mg  60 mg Oral Daily Jenne Campus, MD   60 mg at 12/05/14 0034  . gabapentin (NEURONTIN) capsule 100 mg  100 mg Oral TID Jenne Campus, MD   100 mg at 12/05/14 0831  . isometheptene-acetaminophen-dichloralphenazone (MIDRIN) capsule 1 capsule  1 capsule Oral Q6H PRN Jenne Campus, MD   1 capsule at 12/05/14 0835  . levETIRAcetam (KEPPRA) tablet 500 mg  500 mg Oral BID Shuvon B Rankin, NP   500 mg at 12/05/14 0831  . lisinopril (PRINIVIL,ZESTRIL) tablet 20 mg  20 mg Oral Daily Encarnacion Slates, NP   20 mg at 12/05/14 0831  . magnesium hydroxide (MILK OF MAGNESIA) suspension 30 mL  30 mL Oral Daily PRN Shuvon B Rankin, NP      . methocarbamol (ROBAXIN) tablet 500 mg  500 mg Oral Q8H PRN Jenne Campus, MD   500 mg at 12/04/14 2002  . multivitamin with minerals tablet 1 tablet  1 tablet Oral Daily Shuvon B Rankin, NP   1 tablet at 12/05/14 9179  . neomycin-bacitracin-polymyxin (NEOSPORIN) ointment   Topical BID Niel Hummer, NP      . pantoprazole (PROTONIX) EC tablet 40 mg  40 mg Oral BID Shuvon B Rankin, NP   40 mg at 12/05/14  0831  . QUEtiapine (SEROQUEL) tablet 25 mg  25 mg Oral QHS Jenne Campus, MD   25 mg at 12/04/14 2110    Lab Results: No results found for this or any previous visit (from the past 48 hour(s)).  Physical Findings: AIMS: Facial and Oral Movements Muscles of Facial Expression: None, normal Lips and Perioral Area: None, normal Jaw: None, normal Tongue: None, normal,Extremity Movements Upper (arms, wrists, hands, fingers): None, normal Lower (legs, knees, ankles, toes): None, normal, Trunk Movements Neck, shoulders, hips: None, normal, Overall Severity Severity of abnormal movements (highest score from questions above): None, normal Incapacitation due to abnormal movements: None, normal Patient's awareness of abnormal movements (rate only patient's report): No Awareness, Dental Status Current problems with teeth and/or dentures?: Yes (broken teeth) Does patient usually wear dentures?: No  CIWA:    COWS:      Assessment-  Yesterday patient had suicide attempt by trying to choke self. She desisted but later did have a grand mal seizure. She has a documented history of seizures, and is on Keppra. She was transported to [facility] ED and returned to unit after medical clearance. Other than muscle soreness no sequelae. Today presenting improved, with improved mood, fuller range of affect, denying any SI, and  Focusing more on positive aspects of her life, such as being a sponsor.  Treatment Plan Summary: Daily contact with patient to assess and evaluate symptoms and progress in treatment and Medication management:  Robaxin PRNS for  muscular pain Prozac 60 mgrs QDAY Increase Neurontin  To 200 mgrs  TID - history of good response and tolerance to this medication. Patient on Keppra  ( Will order a Keppra serum level to determine if level is therapeutic )  Seroquel 25 mgrs QHS for insomnia and night time anxiety Although currently denies SI, would continue 1:1 observation at this time.   Medical  Decision Making:  Review of Psycho-Social Stressors (1), Established Problem, Worsening (2) and Review of Medication Regimen & Side Effects (2)  [name],  12/05/2014, 11:03 AM

## 2014-12-06 DIAGNOSIS — Z886 Allergy status to analgesic agent status: Secondary | ICD-10-CM | POA: Insufficient documentation

## 2014-12-06 DIAGNOSIS — Z882 Allergy status to sulfonamides status: Secondary | ICD-10-CM | POA: Insufficient documentation

## 2014-12-06 DIAGNOSIS — Z90710 Acquired absence of both cervix and uterus: Secondary | ICD-10-CM | POA: Insufficient documentation

## 2014-12-06 DIAGNOSIS — F419 Anxiety disorder, unspecified: Secondary | ICD-10-CM | POA: Insufficient documentation

## 2014-12-06 DIAGNOSIS — F319 Bipolar disorder, unspecified: Principal | ICD-10-CM | POA: Insufficient documentation

## 2014-12-06 DIAGNOSIS — K219 Gastro-esophageal reflux disease without esophagitis: Secondary | ICD-10-CM | POA: Insufficient documentation

## 2014-12-06 DIAGNOSIS — Z880 Allergy status to penicillin: Secondary | ICD-10-CM | POA: Insufficient documentation

## 2014-12-06 DIAGNOSIS — I10 Essential (primary) hypertension: Secondary | ICD-10-CM | POA: Insufficient documentation

## 2014-12-06 DIAGNOSIS — G43909 Migraine, unspecified, not intractable, without status migrainosus: Secondary | ICD-10-CM | POA: Insufficient documentation

## 2014-12-06 DIAGNOSIS — Z885 Allergy status to narcotic agent status: Secondary | ICD-10-CM | POA: Insufficient documentation

## 2014-12-06 DIAGNOSIS — M47812 Spondylosis without myelopathy or radiculopathy, cervical region: Secondary | ICD-10-CM | POA: Insufficient documentation

## 2014-12-06 DIAGNOSIS — F17210 Nicotine dependence, cigarettes, uncomplicated: Secondary | ICD-10-CM | POA: Insufficient documentation

## 2014-12-06 LAB — CBC WITH DIFFERENTIAL/PLATELET
BASOS ABS: 0 10*3/uL (ref 0.0–0.1)
BASOS PCT: 0 % (ref 0–1)
Eosinophils Absolute: 0.2 10*3/uL (ref 0.0–0.7)
Eosinophils Relative: 2 % (ref 0–5)
HEMATOCRIT: 37.2 % (ref 36.0–46.0)
HEMOGLOBIN: 12.4 g/dL (ref 12.0–15.0)
Lymphocytes Relative: 48 % — ABNORMAL HIGH (ref 12–46)
Lymphs Abs: 4.7 10*3/uL — ABNORMAL HIGH (ref 0.7–4.0)
MCH: 32.7 pg (ref 26.0–34.0)
MCHC: 33.3 g/dL (ref 30.0–36.0)
MCV: 98.2 fL (ref 78.0–100.0)
Monocytes Absolute: 0.8 10*3/uL (ref 0.1–1.0)
Monocytes Relative: 8 % (ref 3–12)
NEUTROS ABS: 4 10*3/uL (ref 1.7–7.7)
Neutrophils Relative %: 41 % — ABNORMAL LOW (ref 43–77)
RBC: 3.79 MIL/uL — ABNORMAL LOW (ref 3.87–5.11)
RDW: 13.8 % (ref 11.5–15.5)
WBC: 9.8 10*3/uL (ref 4.0–10.5)

## 2014-12-06 LAB — GLUCOSE, CAPILLARY: Glucose-Capillary: 126 mg/dL — ABNORMAL HIGH (ref 70–99)

## 2014-12-06 LAB — COMPREHENSIVE METABOLIC PANEL
ALT: 31 U/L (ref 0–35)
AST: 42 U/L — ABNORMAL HIGH (ref 0–37)
Albumin: 4.5 g/dL (ref 3.5–5.2)
Alkaline Phosphatase: 92 U/L (ref 39–117)
Anion gap: 13 (ref 5–15)
BILIRUBIN TOTAL: 0.9 mg/dL (ref 0.3–1.2)
BUN: 7 mg/dL (ref 6–23)
CALCIUM: 9.6 mg/dL (ref 8.4–10.5)
CHLORIDE: 105 mmol/L (ref 96–112)
CO2: 22 mmol/L (ref 19–32)
Creatinine, Ser: 0.84 mg/dL (ref 0.50–1.10)
GFR calc Af Amer: >90 mL/min (ref >90)
GFR, EST NON AFRICAN AMERICAN: 81 mL/min — AB (ref >90)
Glucose, Bld: 95 mg/dL (ref 70–99)
Potassium: 4.7 mmol/L (ref 3.5–5.1)
Sodium: 140 mmol/L (ref 135–145)
Total Protein: 7.5 g/dL (ref 6.0–8.3)

## 2014-12-06 LAB — CK: Total CK: 93 U/L (ref 7–177)

## 2014-12-06 MED ORDER — LORAZEPAM 2 MG/ML IJ SOLN
2.0000 mg | Freq: Once | INTRAMUSCULAR | Status: AC
  Administered 2014-12-06: 2 mg via INTRAMUSCULAR

## 2014-12-06 MED ORDER — DIPHENHYDRAMINE HCL 50 MG/ML IJ SOLN
50.0000 mg | Freq: Once | INTRAMUSCULAR | Status: AC
  Administered 2014-12-06: 50 mg via INTRAVENOUS

## 2014-12-06 MED ORDER — FENTANYL CITRATE 0.05 MG/ML IJ SOLN
50.0000 ug | Freq: Once | INTRAMUSCULAR | Status: AC
  Administered 2014-12-06: 50 ug via INTRAVENOUS
  Filled 2014-12-06: qty 2

## 2014-12-06 MED ORDER — HYDROXYZINE HCL 25 MG PO TABS
25.0000 mg | ORAL_TABLET | Freq: Every evening | ORAL | Status: DC | PRN
  Administered 2014-12-07 – 2014-12-08 (×3): 25 mg via ORAL
  Filled 2014-12-06 (×3): qty 1

## 2014-12-06 MED ORDER — DIPHENHYDRAMINE HCL 50 MG/ML IJ SOLN
INTRAMUSCULAR | Status: AC
  Filled 2014-12-06: qty 1

## 2014-12-06 MED ORDER — LORAZEPAM 2 MG/ML IJ SOLN
1.0000 mg | Freq: Once | INTRAMUSCULAR | Status: AC
  Administered 2014-12-06: 1 mg via INTRAVENOUS
  Filled 2014-12-06: qty 1

## 2014-12-06 MED ORDER — LORAZEPAM 2 MG/ML IJ SOLN
INTRAMUSCULAR | Status: AC
  Filled 2014-12-06: qty 1

## 2014-12-06 MED ORDER — SODIUM CHLORIDE 0.9 % IV BOLUS (SEPSIS)
1000.0000 mL | Freq: Once | INTRAVENOUS | Status: AC
  Administered 2014-12-06: 1000 mL via INTRAVENOUS

## 2014-12-06 NOTE — ED Provider Notes (Signed)
CSN: [id number]     Arrival date & time 12/04/14  1618 History   First MD Initiated Contact with Patient 12/04/14 1711     Chief Complaint  Patient presents with  . Seizures     (Consider location/radiation/quality/duration/timing/severity/associated sxs/prior Treatment) HPI Comments: The patient is a 49 year old female, she has a history of seizure disorder on Keppra as well as a history of mental health problems including a recent suicide attempt by overdose of acetaminophen. She was admitted to the behavioral health [facility] approximately a days ago, she was sent here 2 days ago after trying to choke herself in her bedroom with her bedsheets, had a seizure, was cleared and sent back. She has been on one-on-one observation since that time, staff at the facility per their notes noticed that she started to have more symptoms of being withdrawn in the mid afternoon after she found out that she would no longer be on one-on-one observation. She did not eat her dinner, has been nauseated and complains of headache neck pain. According to the staff she became very rigid and had some shaking activity in her arms though she was awake and alert during this period. She was given Ativan prior to transport, the patient denies chest pain shortness of breath abdominal pain or swelling of the legs. She does endorse being upset when she found out that news.    Patient is a 49 y.o. female presenting with seizures. The history is provided by the patient, the EMS personnel and medical records.  Seizures   Past Medical History  Diagnosis Date  . Migraines   . Endometriosis   . ETOH abuse     sober 3 1/2 years  . Anorexia   . Depression   . Anxiety   . DJD (degenerative joint disease) of cervical spine   . Seizures   . PICC (peripherally inserted central catheter) in place     rt neck   Past Surgical History  Procedure Laterality Date  . Knee surgery    . Abdominal surgery    . Cholecystectomy    .  Appendectomy    . Abdominal hysterectomy    . Nasal sinus surgery    . Laproscopy    . Carpal tunnel release      2010  . Esophagogastroduodenoscopy N/A 10/06/2014    Procedure: ESOPHAGOGASTRODUODENOSCOPY (EGD);  Surgeon: Inda Castle, MD;  Location: [place];  Service: Endoscopy;  Laterality: N/A;   Family History  Problem Relation Age of Onset  . Hypertension Mother   . Hyperlipidemia Mother   . Heart failure Father   . Cancer Other    History  Substance Use Topics  . Smoking status: Current Every Day Smoker -- 0.25 packs/day for 20 years    Types: Cigarettes  . Smokeless tobacco: Never Used  . Alcohol Use: No   OB History    No data available     Review of Systems  Neurological: Positive for seizures.  All other systems reviewed and are negative.     Allergies  Aspirin; Doxycycline; Imitrex; Iodinated diagnostic agents; Lidocaine; Prednisone; Sulfa antibiotics; Sulfasalazine; Sumatriptan; Tramadol; Levofloxacin; Latex; Levofloxacin; Metrizamide; Nsaids; and Tolmetin  Home Medications   Prior to Admission medications   Medication Sig Start Date End Date Taking? Authorizing Provider  busPIRone (BUSPAR) 7.5 MG tablet Take 1 tablet (7.5 mg total) by mouth 2 (two) times daily. 10/19/14  Yes Knox Royalty, NP  Calcium Carbonate-Vitamin D (CALCIUM-VITAMIN D3 PO) Take 1 capsule by mouth  daily.   Yes Historical Provider, MD  carisoprodol (SOMA) 350 MG tablet Take 1 tablet (350 mg total) by mouth at bedtime. Patient taking differently: Take 350 mg by mouth 2 (two) times daily.  10/19/14  Yes Knox Royalty, NP  clonazePAM (KLONOPIN) 1 MG tablet Take 1 tablet (1 mg total) by mouth 3 (three) times daily as needed (anxiety). 10/19/14  Yes Knox Royalty, NP  diphenhydrAMINE (BENADRYL) 25 MG tablet Take 25 mg by mouth every 6 (six) hours as needed for itching or allergies.   Yes Historical Provider, MD  FLUoxetine (PROZAC) 20 MG capsule Take 1 capsule (20 mg total) by mouth  daily. 10/19/14  Yes Knox Royalty, NP  gabapentin (NEURONTIN) 100 MG capsule Take 1 capsule (100 mg total) by mouth 3 (three) times daily. 10/19/14  Yes Knox Royalty, NP  levETIRAcetam (KEPPRA) 500 MG tablet Take 1 tablet (500 mg total) by mouth 2 (two) times daily. 10/19/14  Yes Knox Royalty, NP  Multiple Vitamin (MULTIVITAMIN WITH MINERALS) TABS tablet Take 1 tablet by mouth daily.   Yes Historical Provider, MD  pantoprazole (PROTONIX) 40 MG tablet Take 1 tablet (40 mg total) by mouth 2 (two) times daily. 10/19/14  Yes Knox Royalty, NP   BP 144/91 mmHg  Pulse 88  Temp(Src) 98.6 F (37 C) (Rectal)  Resp 19  Ht 5' 1.5" (1.562 m)  Wt 129 lb (58.514 kg)  BMI 23.98 kg/m2  SpO2 97% Physical Exam  Constitutional: She appears well-developed and well-nourished.  Agitated  HENT:  Head: Normocephalic and atraumatic.  Mouth/Throat: Oropharynx is clear and moist. No oropharyngeal exudate.  Eyes: Conjunctivae and EOM are normal. Pupils are equal, round, and reactive to light. Right eye exhibits no discharge. Left eye exhibits no discharge. No scleral icterus.  Neck: Normal range of motion. Neck supple. No JVD present. No thyromegaly present.  Cardiovascular: Regular rhythm, normal heart sounds and intact distal pulses.  Exam reveals no gallop and no friction rub.   No murmur heard. Tachycardia  Pulmonary/Chest: Effort normal and breath sounds normal. No respiratory distress. She has no wheezes. She has no rales.  Abdominal: Soft. Bowel sounds are normal. She exhibits no distension and no mass. There is no tenderness.  Musculoskeletal: Normal range of motion. She exhibits tenderness (tenderness to the bilateral upper extremities and the compartments, they are soft but tender, the patient has her muscles in flexion including bilateral hands gripped, forearms and biceps flexed). She exhibits no edema.  Lymphadenopathy:    She has no cervical adenopathy.  Neurological: She is alert. Coordination  normal.  Answers questions correctly, follows commands but has some difficulty with her bilateral upper extremities with relaxing her muscles  Skin: Skin is warm and dry. No rash noted. No erythema.  Psychiatric: She has a normal mood and affect. Her behavior is normal.  Nursing note and vitals reviewed.   ED Course  Procedures (including critical care time) Labs Review Labs Reviewed  GLUCOSE, CAPILLARY - Abnormal; Notable for the following:    Glucose-Capillary 126 (*)    All other components within normal limits  CBC WITH DIFFERENTIAL/PLATELET - Abnormal; Notable for the following:    RBC 3.79 (*)    Neutrophils Relative % 41 (*)    Lymphocytes Relative 48 (*)    Lymphs Abs 4.7 (*)    All other components within normal limits  COMPREHENSIVE METABOLIC PANEL - Abnormal; Notable for the following:    AST 42 (*)  GFR calc non Af Amer 81 (*)    All other components within normal limits  CK  POC URINE PREG, ED    Imaging Review No results found.    MDM   Final diagnoses:  Seizure disorder  Anxiety attack    The patient appears to have bilateral muscular contractures, she has evidence of some malingering type behavior as this occurred after she found out upsetting news for her. She does have a temperature of 100.1, I would also consider other sources such as serotonin syndrome, infection, behavioral source most likely answer. Benadryl and Ativan given, fluids, check labs  After receiving the below medications the patient had significant calming, she was able to speak clearly, and very supple joints and soft compartments, no more contractures, no more tetany, no fever, rectal temperature was 98.6. Heart rate is no longer tachycardic, labs show no signs of rhabdomyolysis, I do believe that her symptoms resolved around the anxiety provoking issue of a behavioral augmentation change at her inpatient psychiatric facility. She can safely be discharged back to that facility to be  under their care. She states that she can take Robaxin for her symptoms at that time.     Noemi Chapel, MD 12/06/14 [phone]

## 2014-12-06 NOTE — Progress Notes (Addendum)
Patient ID: [name], female   DOB: [birth year], 49 y.o.   MRN: [id number]  1:1 note: Pt sitting in chair in the dayroom. Pt just returned from dinner. Pt respirations even and unlabored. Sitter in the dayroom sitting next to pt. Pt remains safe with 1:1 precautions due to SI. Will continue to monitor.

## 2014-12-06 NOTE — Progress Notes (Signed)
Pt alert and oriented. Pt calm and cooperative. Pt was on 1:1 observation today and has done well with sitter. No issues. Pt denies any SI/HI/AH/VH. Pt reports feeling like she will never have a suicide attempt again. Pt also reports feeling hopeful. Pt able to voice concerns. Pt remains appropriate in milieu. Pt has been compliant with medications. No current concerns. RN will continue to monitor.

## 2014-12-06 NOTE — Progress Notes (Signed)
Pt alert and oriented. Pt calm and cooperative. Pt is on 1:1 observation and has done well with sitter. No current issues. Pt denies any SI/HI/AH/VH. Pt is bright this morning and seems to be in a positive mood. Pt reports feeling like she will never have a suicide attempt again. Pt also reports feeling hopeful. Pt able to voice concerns. Pt remains appropriate in milieu. Pt has been compliant with medications. No current concerns. Pt remains on 1:1 for safety. Staff and RN will continue to monitor.

## 2014-12-06 NOTE — Progress Notes (Signed)
Pt alert and oriented. Pt calm and cooperative. Pt has been on 1:1 observation today and has done well with sitter. No issues. Pt denies any SI/HI/AH/VH. Pt reports feeling like she will never have a suicide attempt again. Pt also reports feeling hopeful. Pt has been compliant with medications. No current concerns. RN will continue to monitor.

## 2014-12-06 NOTE — ED Notes (Signed)
Received pt via [place] for possible seizure episode.  EMS reports pt refused to take her 5pm dose of keppra-later on had a seizure-like activity.  Sitter is at bedside from [facility].  Pt reports she did not feel good this is why she did not take her keppra.  Pt reports generalized bodyaches.  Pt is warm to touch.  Is very stiff and would not relax.  She states that she is in too much pain, therefore she cannot relax.

## 2014-12-06 NOTE — Progress Notes (Signed)
Patient ID: [name], female   DOB: 07/28/1966, 49 y.o.   MRN: [id number]  Pt was sitting in a chair in the dayroom. Sitter was next to pt. Sitter states "she was sitting there and then all of a sudden she started" tensing up. Pt's hands were clenched, her face was red and her body was tense. Pt was leaning of a trash can, heaving but not vomiting. Pt vitals were taken, EMS called and 2 mg of Ativan was given. Pt sent to [facility] ED with EMS at [place] for further assessment. Sitter is in route with pt.

## 2014-12-06 NOTE — Progress Notes (Signed)
Patient had a witnessed "seizure".  Patient was alert however, she was becoming tense ans started to shake in a seated position.  BP is increased at 188/115.  CBG 120.  Lorazepam 2 mg IM once.  EMS called.  Care transferred.

## 2014-12-06 NOTE — Progress Notes (Signed)
Patient ID: [name], female   DOB: 03-Feb-1966, 49 y.o.   MRN: [id number]  Pt flat affect and depressed behavior. Pt tearful at times and wringing hands while curled in a ball in the dayroom. Pt blaming others and says "I'ts just not worth it. Nothing I do changes anything." Pt reports that her sister told her "I wish you would just do it already" when she went to live with her after her last discharge at [facility].   Pt on a 1:1 for safety. Pt given a 1:1 and encouraged to express feelings and concerns.   Will continue to monitor. Pt refuses night time medications.

## 2014-12-06 NOTE — [facility] Group Notes (Signed)
[place] LCSW Group Therapy  Feelings Around Relapse 1:15 -2:30        12/06/2014   Type of Therapy:  Group Therapy  Participation Level:  Appropriate  Participation Quality:  Appropriate  Affect:  Depressed  Cognitive:  Attentive Appropriate  Insight:  Developing/Improving  Engagement in Therapy: Developing/Improving  Modes of Intervention:  Discussion Exploration Problem-Solving Supportive  Summary of Progress/Problems:  The topic for today was feelings around relapse. Patient processed feelings toward relapse and was able to relate to peers. She shared relapse for  Her would be living in the past and focusing on negativity.  She shared she has been working on facing her truth and not sitting in there problems.     Concha Pyo 12/06/2014

## 2014-12-06 NOTE — [facility] Group Notes (Signed)
[facility] LCSW Aftercare Discharge Planning Group Note   12/06/2014 1:13 PM    Participation Quality:  Appropraite  Mood/Affect:  Appropriate  Depression Rating:  5  Anxiety Rating:  5  Thoughts of Suicide:  No  Will you contract for safety?   NA  Current AVH:  No  Plan for Discharge/Comments:  Patient attended discharge planning group and actively participated in group. Patient reports having more hope and feeling better.  Suicide prevention education reviewed and SPE document provided.   Transportation Means: Patient has transportation.   Supports:  Patient has a limited support system.   Kyser Wandel, [place] Post

## 2014-12-06 NOTE — ED Notes (Signed)
[facility] EDP verbalized pt can be transported back to [facility]

## 2014-12-06 NOTE — Tx Team (Addendum)
Interdisciplinary Treatment Plan Update   Date Reviewed:  12/06/2014  Time Reviewed:  8:33 AM  Progress in Treatment:   Attending groups:Patient is attending groups. Participating in groups: Patient engages in discussions. Taking medication as prescribed: Yes  Tolerating medication: Yes Family/Significant other contact made:  Yes,  SPE with sister.  Patient understands diagnosis: Yes, patient understands diagnosis and need for treatment. Discussing patient identified problems/goals with staff: Yes, patient is able to express goals for treatment and discharge. Medical problems stabilized or resolved: Yes Denies suicidal/homicidal ideation: No Patient has not harmed self or others: Yes  For review of initial/current patient goals, please see plan of care.  Estimated Length of Stay:  3-5 days  Reasons for Continued Hospitalization:  Anxiety Depression Medication stabilization  New Problems/Goals identified:    Discharge Plan or Barriers:    [facility] declined patient for ECT.  Additional Comments:    Continue medication stabilization  Patient and CSW reviewed patient's identified goals and treatment plan.  Patient verbalized understanding and agreed to treatment plan.   Attendees:  Patient:  12/06/2014 8:33 AM   Signature:  Gabriel Earing, MD 12/06/2014 8:33 AM  Signature:  12/06/2014 8:33 AM  Signature:  Oswaldo Milian, RN 12/06/2014 8:33 AM  Signature:  Anson Crofts, RN 12/06/2014 8:33 AM  Signature:   12/06/2014 8:33 AM  Signature:  Joette Catching, LCSW 12/06/2014 8:33 AM  Signature: Heather Smart LCSW-A 12/06/2014 8:33 AM  Signature 12/06/2014 8:33 AM  Signature:   12/06/2014 8:33 AM  Signature: 12/06/2014  8:33 AM  Signature:   Lars Pinks, RN [facility] 12/06/2014  8:33 AM  Signature:   12/06/2014  8:33 AM    Scribe for Treatment Team:   Joette Catching,  12/06/2014 8:33 AM

## 2014-12-06 NOTE — Progress Notes (Signed)
Patient ID: [name], female   DOB: 1965-10-30, 49 y.o.   MRN: [id number]  1:1 Note:  D: patient pleasant and cooperative with care, brightens on approach. Pt states she is "sorry for what I did to myself yesterday. I will never do that again".  A: Encourage staff/peer interaction and group participation. Administer medications as ordered by MD. Pt remains on a 1:1 observation for safety. R: Patient compliant with HS medications and attended karaoke activity session with peers. No inappropriate behaviors noted during shift.

## 2014-12-06 NOTE — Progress Notes (Signed)
Patient ID: [name], female   DOB: [birth year], 49 y.o.   MRN: [id number]  1:1 Note:  D: Patient currently asleep with no s/s of distress noted. Respirations regular and unlabored. A: Monitor pt 1:1 as ordered for safety and SI. R: No change in pt's status at this time.

## 2014-12-06 NOTE — ED Notes (Signed)
Per EMS, pt is from [facility] with a sitter.  ?seisure episode.  Pt refused her 5pm dose of keppra today-later on had a seizure episode per staff.  Pt is A&Ox 4 per EMS.

## 2014-12-06 NOTE — Progress Notes (Signed)
Recreation Therapy Notes  Date: 03.18.2016 Time: 9:30am Location: 300 Hall Group Room   Group Topic: Stress Management  Goal Area(s) Addresses:  Patient will actively participate in stress management techniques presented during session.   Behavioral Response: Engaged, Attentive.   Intervention: Stress Management   Activity :  Deep Breathing and Progressive Muscle Relaxation   Education:  Stress Management, Discharge Planning.   Education Outcome: Acknowledges education  Clinical Observations/Feedback: Patient engaged in both deep breathing and progressive muscle relaxation, demonstrating ability to practice independently post d/c.    Laureen Ochs Hunter Pinkard, LRT/CTRS  Lane Hacker 12/06/2014 4:34 PM

## 2014-12-06 NOTE — Progress Notes (Signed)
Patient ID: [name], female   DOB: 02/03/1966, 49 y.o.   MRN: [id number] [facility] MD Progress Note  12/06/2014 9:07 AM [name]  MRN:  [id number]  Subjective: She states she is feeling better today. She states that she has been feeling more hopeful, and that " for the first time in a while, I have been able to say, things will be OK and I will not hurt myself, no matter what". She spoke with Rodman Key, the Chaplain, yesterday and felt like she had "a ray of hope for the first time and I don't  want it to go away".  She states "I have tabled suicide thoughts for now".   She is more future oriented, and states that she has plans to speak with her sponsor about a place to stay when she leaves if her current housing situation falls through.     Objective : I have discussed case with treatment team and have met with patient. As discussed with staff, there have been no further episodes of self injurious behaviors or statements. She has been visible in day room and has been going to groups, interacting with peers. States she really benefitted from meeting with Chaplain and from a hopeful poem, celebrating the beauty and meaning of life which was given to her by another patient. At this time presents with improved mood and range of affect. As noted above, at present denies any suicidal thoughts and states she feels more hopeful. As discussed with treatment team , patient  Not accepted to [facility] at this time. We discussed, and patient states that she feels medications are starting to work anyway. Due to recent seizure in spite of being on Keppra, a serum  level  Was ordered yesterday,  But lab was unable to obtain/ get venous blood draw. Will try again tomorrow in AM. Patient does state she has had prior seizures even on Keppra, but that medication has been helpful in decreasing episodes, and feels her seizure was result of suicidal gesture preceding it. Sleep is improved but patient still feels it is  sub-optimal. She has tried different sleeping medications in the past, and states that most do not work or cause weight gain concerns. We discussed Rozerem, Remeron  or Vistaril as options. Prefers the latter.        Principal Problem: MDD (major depressive disorder), recurrent severe, without psychosis Diagnosis:   Patient Active Problem List   Diagnosis Date Noted  . MDD (major depressive disorder), recurrent severe, without psychosis [F33.2] 11/29/2014  . Panic disorder [F41.0] 11/29/2014  . Agoraphobia [F40.00] 11/29/2014  . ARF (acute renal failure) [N17.9] 11/25/2014  . Intentional acetaminophen overdose [T39.1X2A] 10/26/2014  . Major depressive disorder, recurrent, severe without psychotic features [F33.2]   . Insomnia [G47.00]   . Chronic pain syndrome [G89.4]   . Bipolar 1 disorder, mixed, severe [F31.63] 10/09/2014  . Acute blood loss anemia [D62] 10/07/2014  . Liver failure, acute [K72.00]   . GI bleed [K92.2] 10/06/2014  . Duodenal ulcer hemorrhage [K26.4] 10/06/2014  . Vomiting [R11.10] 10/05/2014  . Acute hepatitis [K72.00] 10/05/2014  . Acute renal insufficiency [N28.9] 10/05/2014  . Seizure disorder [G40.909] 10/05/2014  . Hyperammonemia [E72.20] 10/05/2014  . Suicidal ideation [R45.851] 10/05/2014  . Bipolar disorder [F31.9] 10/01/2014  . Major depression [F32.2] 10/01/2014  . Nausea and vomiting [R11.2] 09/30/2014  . Diarrhea [R19.7] 09/30/2014  . SIRS (systemic inflammatory response syndrome) [A41.9] 07/15/2014  . Tylenol overdose [T39.1X4A] 07/13/2014  . Hypokalemia [E87.6] 07/13/2014  Total Time spent with patient: 25 minutes  Past Medical History:  Past Medical History  Diagnosis Date  . Migraines   . Endometriosis   . ETOH abuse     sober 3 1/2 years  . Anorexia   . Depression   . Anxiety   . DJD (degenerative joint disease) of cervical spine   . Seizures   . PICC (peripherally inserted central catheter) in place     rt neck    Past  Surgical History  Procedure Laterality Date  . Knee surgery    . Abdominal surgery    . Cholecystectomy    . Appendectomy    . Abdominal hysterectomy    . Nasal sinus surgery    . Laproscopy    . Carpal tunnel release      2010  . Esophagogastroduodenoscopy N/A 10/06/2014    Procedure: ESOPHAGOGASTRODUODENOSCOPY (EGD);  Surgeon: Inda Castle, MD;  Location: [facility];  Service: Endoscopy;  Laterality: N/A;   Family History:  Family History  Problem Relation Age of Onset  . Hypertension Mother   . Hyperlipidemia Mother   . Heart failure Father   . Cancer Other    Social History:  History  Alcohol Use No     History  Drug Use No    History   Social History  . Marital Status: Single    Spouse Name: N/A  . Number of Children: N/A  . Years of Education: N/A   Social History Main Topics  . Smoking status: Current Every Day Smoker -- 0.25 packs/day for 20 years    Types: Cigarettes  . Smokeless tobacco: Never Used  . Alcohol Use: No  . Drug Use: No  . Sexual Activity: Not on file   Other Topics Concern  . None   Social History Narrative   Additional History:    Sleep: improved   Appetite:   Improved   Assessment:   Musculoskeletal: Strength & Muscle Tone: within normal limits Gait & Station: normal Patient leans: N/A  Psychiatric Specialty Exam: Physical Exam  Review of Systems  Constitutional: Negative for fever and chills.  Eyes: Negative.   Respiratory: Negative for shortness of breath.   Cardiovascular: Negative for chest pain.  Gastrointestinal: Negative for nausea, vomiting and blood in stool.  Skin: Negative for rash.  Neurological: Negative for headaches.  Psychiatric/Behavioral: Positive for depression. Negative for hallucinations.    Blood pressure 140/89, pulse 87, temperature 98 F (36.7 C), temperature source Oral, resp. rate 18, height 5' 1.5" (1.562 m), weight 129 lb (58.514 kg), SpO2 98 %.Body mass index is 23.98 kg/(m^2).   General Appearance: Well Groomed  Engineer, water::  Good  Speech:  Clear and Coherent  Volume:  Normal  Mood:   Today states she feels better, less depressed   Affect:  less constricted , today smiling , more reactive   Thought Process:  Linear  Orientation:  Full (Time, Place, and Person)  Thought Content:  No hallucinations, no delusions   Suicidal Thoughts:  No- at this time denies any ongoing SI, contracts for safety on unit.  Homicidal Thoughts:  No  Memory:  Recent and remote grossly intact   Judgement:  Fair  Insight:  Present  Psychomotor Activity:  Normal, no psychomotor agitation  Concentration:  Good  Recall:  Good  Fund of Knowledge:Good  Language: Good  Akathisia:  No  Handed:  Right  AIMS (if indicated):     Assets:  Communication Skills Desire for Improvement  ADL's:  Intact  Cognition: fair   Sleep:  Number of Hours: 6.5   Current Medications: Current Facility-Administered Medications  Medication Dose Route Frequency Provider Last Rate Last Dose  . acetaminophen (TYLENOL) tablet 650 mg  650 mg Oral Once Encarnacion Slates, NP   650 mg at 12/01/14 1512  . alum & mag hydroxide-simeth (MAALOX/MYLANTA) 200-200-20 MG/5ML suspension 30 mL  30 mL Oral Q4H PRN Shuvon B Rankin, NP      . clonazePAM (KLONOPIN) tablet 0.5 mg  0.5 mg Oral TID PRN Encarnacion Slates, NP   0.5 mg at 12/05/14 2137  . FLUoxetine (PROZAC) capsule 60 mg  60 mg Oral Daily Myer Peer Cobos, MD   60 mg at 12/06/14 0900  . gabapentin (NEURONTIN) capsule 200 mg  200 mg Oral TID Jenne Campus, MD   200 mg at 12/05/14 1701  . isometheptene-acetaminophen-dichloralphenazone (MIDRIN) capsule 1 capsule  1 capsule Oral Q6H PRN Jenne Campus, MD   1 capsule at 12/05/14 1953  . levETIRAcetam (KEPPRA) tablet 500 mg  500 mg Oral BID Shuvon B Rankin, NP   500 mg at 12/06/14 0900  . lisinopril (PRINIVIL,ZESTRIL) tablet 20 mg  20 mg Oral Daily Encarnacion Slates, NP   20 mg at 12/05/14 0831  . magnesium hydroxide (MILK OF  MAGNESIA) suspension 30 mL  30 mL Oral Daily PRN Shuvon B Rankin, NP      . methocarbamol (ROBAXIN) tablet 500 mg  500 mg Oral Q8H PRN Jenne Campus, MD   500 mg at 12/05/14 1703  . multivitamin with minerals tablet 1 tablet  1 tablet Oral Daily Shuvon B Rankin, NP   1 tablet at 12/05/14 1610  . neomycin-bacitracin-polymyxin (NEOSPORIN) ointment   Topical BID Niel Hummer, NP      . pantoprazole (PROTONIX) EC tablet 40 mg  40 mg Oral BID Shuvon B Rankin, NP   40 mg at 12/06/14 0900  . QUEtiapine (SEROQUEL) tablet 25 mg  25 mg Oral QHS Jenne Campus, MD   25 mg at 12/05/14 2137    Lab Results: No results found for this or any previous visit (from the past 48 hour(s)).  Physical Findings: AIMS: Facial and Oral Movements Muscles of Facial Expression: None, normal Lips and Perioral Area: None, normal Jaw: None, normal Tongue: None, normal,Extremity Movements Upper (arms, wrists, hands, fingers): None, normal Lower (legs, knees, ankles, toes): None, normal, Trunk Movements Neck, shoulders, hips: None, normal, Overall Severity Severity of abnormal movements (highest score from questions above): None, normal Incapacitation due to abnormal movements: None, normal Patient's awareness of abnormal movements (rate only patient's report): No Awareness, Dental Status Current problems with teeth and/or dentures?: Yes (broken teeth) Does patient usually wear dentures?: No  CIWA:    COWS:      Assessment-   Patient is improved . Today she is presenting with improved mood, improved range of affect, decreased hopelessness, and less irritability. She states she has made decision to " not consider suicide as an option ever again, even when things are difficult". She is tolerating medications well.   Treatment Plan Summary: Daily contact with patient to assess and evaluate symptoms and progress in treatment and Medication management:  Robaxin PRNS for  muscular pain Prozac 60 mgrs QDAY Increase  Neurontin  200 mgrs  TID - history of good response and tolerance to this medication. Patient on Keppra  Start Vistaril 25 mgrs QHS PRN Insomnia .  Discontinue 1:1 observation at this  time.   Medical Decision Making:  Established Problem, Stable/Improving (1), Review of Psycho-Social Stressors (1), Review or order clinical lab tests (1) and Review of Medication Regimen & Side Effects (2)  COBOS, FERNANDO,  12/06/2014, 9:07 AM

## 2014-12-07 ENCOUNTER — Encounter (HOSPITAL_COMMUNITY): Payer: Self-pay | Admitting: Registered Nurse

## 2014-12-07 NOTE — Progress Notes (Signed)
Patient ID: [name], female   DOB: [birth year], 49 y.o.   MRN: [id number] [facility] MD Progress Note  12/07/2014 4:15 PM Nicki Furlan  MRN:  [id number]  Subjective: Patient states " I'm much better today."  Patient denies suicidal thoughts, homicidal ideation, psychosis, and paranoia.  Patient states that she was really upset because she was told the one to one was not coming off.  The nurses don't trust me being alone.  Also states that she had another seizure yesterday.  "I can always tell when one is coming and the next thing I know there was multiple people around and trying to get IV in me."   Patient is upset because she is still on the one to one and is wondering when her 72 hour will be up so that she can go home.    Patient became irritated when the one to one is not removed.  Expressed to the patient that I would like to have another night of observation prior to removing the one to one due to the seriousness of the attempt suicide while here in the hospital and that at this point staff was not comfortable with removing the one to one and our primary goal was her safety.       Objective :  Tolerating medications without adverse affect and attending group sessions    Principal Problem: MDD (major depressive disorder), recurrent severe, without psychosis Diagnosis:   Patient Active Problem List   Diagnosis Date Noted  . MDD (major depressive disorder), recurrent severe, without psychosis [F33.2] 11/29/2014  . Panic disorder [F41.0] 11/29/2014  . Agoraphobia [F40.00] 11/29/2014  . ARF (acute renal failure) [N17.9] 11/25/2014  . Intentional acetaminophen overdose [T39.1X2A] 10/26/2014  . Major depressive disorder, recurrent, severe without psychotic features [F33.2]   . Insomnia [G47.00]   . Chronic pain syndrome [G89.4]   . Bipolar 1 disorder, mixed, severe [F31.63] 10/09/2014  . Acute blood loss anemia [D62] 10/07/2014  . Liver failure, acute [K72.00]   . GI bleed [K92.2] 10/06/2014   . Duodenal ulcer hemorrhage [K26.4] 10/06/2014  . Vomiting [R11.10] 10/05/2014  . Acute hepatitis [K72.00] 10/05/2014  . Acute renal insufficiency [N28.9] 10/05/2014  . Seizure disorder [G40.909] 10/05/2014  . Hyperammonemia [E72.20] 10/05/2014  . Suicidal ideation [R45.851] 10/05/2014  . Bipolar disorder [F31.9] 10/01/2014  . Major depression [F32.2] 10/01/2014  . Nausea and vomiting [R11.2] 09/30/2014  . Diarrhea [R19.7] 09/30/2014  . SIRS (systemic inflammatory response syndrome) [A41.9] 07/15/2014  . Tylenol overdose [T39.1X4A] 07/13/2014  . Hypokalemia [E87.6] 07/13/2014   Total Time spent with patient: 25 minutes  Past Medical History:  Past Medical History  Diagnosis Date  . Migraines   . Endometriosis   . ETOH abuse     sober 3 1/2 years  . Anorexia   . Depression   . Anxiety   . DJD (degenerative joint disease) of cervical spine   . Seizures   . PICC (peripherally inserted central catheter) in place     rt neck    Past Surgical History  Procedure Laterality Date  . Knee surgery    . Abdominal surgery    . Cholecystectomy    . Appendectomy    . Abdominal hysterectomy    . Nasal sinus surgery    . Laproscopy    . Carpal tunnel release      2010  . Esophagogastroduodenoscopy N/A 10/06/2014    Procedure: ESOPHAGOGASTRODUODENOSCOPY (EGD);  Surgeon: Inda Castle, MD;  Location: [place];  Service:  Endoscopy;  Laterality: N/A;   Family History:  Family History  Problem Relation Age of Onset  . Hypertension Mother   . Hyperlipidemia Mother   . Heart failure Father   . Cancer Other    Social History:  History  Alcohol Use No     History  Drug Use No    History   Social History  . Marital Status: Single    Spouse Name: N/A  . Number of Children: N/A  . Years of Education: N/A   Social History Main Topics  . Smoking status: Current Every Day Smoker -- 0.25 packs/day for 20 years    Types: Cigarettes  . Smokeless tobacco: Never Used  .  Alcohol Use: No  . Drug Use: No  . Sexual Activity: Not on file   Other Topics Concern  . None   Social History Narrative   Additional History:    Sleep: improved   Appetite:   Improved   Assessment:   Musculoskeletal: Strength & Muscle Tone: within normal limits Gait & Station: normal Patient leans: N/A  Psychiatric Specialty Exam: Physical Exam  Constitutional: She is oriented to person, place, and time.  Neck: Normal range of motion.  Respiratory: Effort normal.  Musculoskeletal: Normal range of motion.  Neurological: She is alert and oriented to person, place, and time.    Review of Systems  Neurological: Positive for seizures.  Psychiatric/Behavioral: Positive for depression. Negative for suicidal ideas and hallucinations.  All other systems reviewed and are negative.   Blood pressure 153/88, pulse 95, temperature 98 F (36.7 C), temperature source Rectal, resp. rate 16, height 5' 1.5" (1.562 m), weight 58.514 kg (129 lb), SpO2 97 %.Body mass index is 23.98 kg/(m^2).  General Appearance: Well Groomed  Engineer, water::  Good  Speech:  Clear and Coherent  Volume:  Normal  Mood:   Today states she feels better, less depressed   Affect:  less constricted , today smiling , more reactive   Thought Process:  Linear  Orientation:  Full (Time, Place, and Person)  Thought Content:  No hallucinations, no delusions   Suicidal Thoughts:  No- at this time denies any ongoing SI, contracts for safety on unit.  Homicidal Thoughts:  No  Memory:  Recent and remote grossly intact   Judgement:  Fair  Insight:  Present  Psychomotor Activity:  Normal, no psychomotor agitation  Concentration:  Good  Recall:  Good  Fund of Knowledge:Good  Language: Good  Akathisia:  No  Handed:  Right  AIMS (if indicated):     Assets:  Communication Skills Desire for Improvement  ADL's:  Intact  Cognition: fair   Sleep:  Number of Hours: 4.5   Current Medications: Current  Facility-Administered Medications  Medication Dose Route Frequency Provider Last Rate Last Dose  . alum & mag hydroxide-simeth (MAALOX/MYLANTA) 200-200-20 MG/5ML suspension 30 mL  30 mL Oral Q4H PRN Shuvon B Rankin, NP      . clonazePAM (KLONOPIN) tablet 0.5 mg  0.5 mg Oral TID PRN Encarnacion Slates, NP   0.5 mg at 12/07/14 0910  . FLUoxetine (PROZAC) capsule 60 mg  60 mg Oral Daily Jenne Campus, MD   60 mg at 12/07/14 0903  . gabapentin (NEURONTIN) capsule 200 mg  200 mg Oral TID Jenne Campus, MD   200 mg at 12/07/14 1157  . hydrOXYzine (ATARAX/VISTARIL) tablet 25 mg  25 mg Oral QHS PRN Jenne Campus, MD   25 mg at 12/07/14 1441  .  isometheptene-acetaminophen-dichloralphenazone (MIDRIN) capsule 1 capsule  1 capsule Oral Q6H PRN Jenne Campus, MD   1 capsule at 12/07/14 0910  . levETIRAcetam (KEPPRA) tablet 500 mg  500 mg Oral BID Shuvon B Rankin, NP   500 mg at 12/07/14 0905  . lisinopril (PRINIVIL,ZESTRIL) tablet 20 mg  20 mg Oral Daily Encarnacion Slates, NP   20 mg at 12/07/14 0906  . magnesium hydroxide (MILK OF MAGNESIA) suspension 30 mL  30 mL Oral Daily PRN Shuvon B Rankin, NP      . methocarbamol (ROBAXIN) tablet 500 mg  500 mg Oral Q8H PRN Jenne Campus, MD   500 mg at 12/07/14 1441  . multivitamin with minerals tablet 1 tablet  1 tablet Oral Daily Shuvon B Rankin, NP   1 tablet at 12/07/14 0906  . neomycin-bacitracin-polymyxin (NEOSPORIN) ointment   Topical BID Niel Hummer, NP      . pantoprazole (PROTONIX) EC tablet 40 mg  40 mg Oral BID Shuvon B Rankin, NP   40 mg at 12/07/14 0906    Lab Results:  Results for orders placed or performed during the hospital encounter of 11/28/14 (from the past 48 hour(s))  Glucose, capillary     Status: Abnormal   Collection Time: 12/06/14  6:13 PM  Result Value Ref Range   Glucose-Capillary 126 (H) 70 - 99 mg/dL   Comment 1 Notify RN   CBC with Differential/Platelet     Status: Abnormal   Collection Time: 12/06/14  8:11 PM  Result  Value Ref Range   WBC 9.8 4.0 - 10.5 K/uL    Comment: WHITE COUNT CONFIRMED ON SMEAR   RBC 3.79 (L) 3.87 - 5.11 MIL/uL   Hemoglobin 12.4 12.0 - 15.0 g/dL   HCT 37.2 36.0 - 46.0 %   MCV 98.2 78.0 - 100.0 fL   MCH 32.7 26.0 - 34.0 pg   MCHC 33.3 30.0 - 36.0 g/dL   RDW 13.8 11.5 - 15.5 %   Platelets  150 - 400 K/uL    PLATELET CLUMPS NOTED ON SMEAR, COUNT APPEARS INCREASED    Comment: PLATELET COUNT CONFIRMED BY SMEAR   Neutrophils Relative % 41 (L) 43 - 77 %   Neutro Abs 4.0 1.7 - 7.7 K/uL   Lymphocytes Relative 48 (H) 12 - 46 %   Lymphs Abs 4.7 (H) 0.7 - 4.0 K/uL   Monocytes Relative 8 3 - 12 %   Monocytes Absolute 0.8 0.1 - 1.0 K/uL   Eosinophils Relative 2 0 - 5 %   Eosinophils Absolute 0.2 0.0 - 0.7 K/uL   Basophils Relative 0 0 - 1 %   Basophils Absolute 0.0 0.0 - 0.1 K/uL  Comprehensive metabolic panel     Status: Abnormal   Collection Time: 12/06/14  8:11 PM  Result Value Ref Range   Sodium 140 135 - 145 mmol/L   Potassium 4.7 3.5 - 5.1 mmol/L   Chloride 105 96 - 112 mmol/L   CO2 22 19 - 32 mmol/L   Glucose, Bld 95 70 - 99 mg/dL   BUN 7 6 - 23 mg/dL   Creatinine, Ser 0.84 0.50 - 1.10 mg/dL   Calcium 9.6 8.4 - 10.5 mg/dL   Total Protein 7.5 6.0 - 8.3 g/dL   Albumin 4.5 3.5 - 5.2 g/dL   AST 42 (H) 0 - 37 U/L   ALT 31 0 - 35 U/L   Alkaline Phosphatase 92 39 - 117 U/L   Total Bilirubin 0.9 0.3 -  1.2 mg/dL   GFR calc non Af Amer 81 (L) >90 mL/min   GFR calc Af Amer >90 >90 mL/min    Comment: (NOTE) The eGFR has been calculated using the CKD EPI equation. This calculation has not been validated in all clinical situations. eGFR's persistently <90 mL/min signify possible Chronic Kidney Disease.    Anion gap 13 5 - 15  CK     Status: None   Collection Time: 12/06/14  8:11 PM  Result Value Ref Range   Total CK 93 7 - 177 U/L    Physical Findings: AIMS: Facial and Oral Movements Muscles of Facial Expression: None, normal Lips and Perioral Area: None, normal Jaw:  None, normal Tongue: None, normal,Extremity Movements Upper (arms, wrists, hands, fingers): None, normal Lower (legs, knees, ankles, toes): None, normal, Trunk Movements Neck, shoulders, hips: None, normal, Overall Severity Severity of abnormal movements (highest score from questions above): None, normal Incapacitation due to abnormal movements: None, normal Patient's awareness of abnormal movements (rate only patient's report): No Awareness, Dental Status Current problems with teeth and/or dentures?: Yes Does patient usually wear dentures?: No  CIWA:    COWS:      Assessment-   Patient is improved . Today she is presenting with improved mood, improved range of affect, decreased hopelessness, and less irritability. She states she has made decision to " not consider suicide as an option ever again, even when things are difficult". She is tolerating medications well.   Treatment Plan Summary: Daily contact with patient to assess and evaluate symptoms and progress in treatment and Medication management:  Robaxin PRNS for  muscular pain Prozac 60 mgrs QDAY Increase Neurontin  200 mgrs  TID - history of good response and tolerance to this medication. Patient on Keppra  Start Vistaril 25 mgrs QHS PRN Insomnia .  Discontinue 1:1 observation at this time.  Will continue with current treatment plan.  No changes at this time  Medical Decision Making:  Established Problem, Stable/Improving (1), Review of Psycho-Social Stressors (1), Review or order clinical lab tests (1) and Review of Medication Regimen & Side Effects (2)  Rankin, Shuvon, FNP-BC 12/07/2014, 4:15 PM

## 2014-12-07 NOTE — Progress Notes (Signed)
Patient ID: [name], female   DOB: 12/29/1965, 49 y.o.   MRN: [id number] D)  Pt has been sleeping, lying on her stomach, eyes closed, resp reg, unlabored, no c/o's voiced. A)  Remains on 1:1 obs for safety with mht at bedside. R)  Safety maintained.

## 2014-12-07 NOTE — Progress Notes (Signed)
Patient ID: Palyn Scrima, female   DOB: [birth year], 49 y.o.   MRN: [id number]  Adult Psychoeducational Group Note  Date:  12/07/2014 Time:  09:30  Group Topic/Focus:   Orientation:   The focus of this group is to educate the patient on the purpose and policies of crisis stabilization and provide a format to answer questions about their admission.  The group details unit policies and expectations of patients while admitted. Wellness Toolbox:   The focus of this group is to discuss various aspects of wellness, balancing those aspects and exploring ways to increase the ability to experience wellness.  Patients will create a wellness toolbox for use upon discharge.  Participation Level:  Active  Participation Quality:  Appropriate  Affect:  Flat  Cognitive:  Appropriate  Insight: Appropriate  Engagement in Group:  Engaged  Modes of Intervention:  Discussion, Education, Orientation and Support  Additional Comments:  Pt a positive part of group today. Pt able to identify one goal to achieve today.   [place], [place] 12/07/2014, 9:57 AM

## 2014-12-07 NOTE — Progress Notes (Signed)
Patient ID: [name], female   DOB: 07-13-1966, 49 y.o.   MRN: [id number]  1:1 Note:   D: Patient currently asleep, no s/s of distress noted. A: Pt remains 1:1 for safety observation. R: No complaints at this time. Respirations regular and unlabored.

## 2014-12-07 NOTE — Progress Notes (Signed)
1:1 Note @ 1600 D) Pt has been sitting in the dayroom and working on a puzzle with another Pt.'s affect is appropriate. Denies SI and HI presently. States she is going to talk to the NP and ask to be taken off the 1:1. A) Provided with a constant 1:1. Given support and reassurance. R) Pt remains safe.

## 2014-12-07 NOTE — Progress Notes (Signed)
Patient ID: [name], female   DOB: 03-22-1966, 49 y.o.   MRN: [id number]  1:1 Note: Patient returned from [facility] Emergency Room. Pt with no s/s of distress noted upon return. Pt smiling and stating "I feel much better." Pt provided with snacks upon request. Gate steady, respirations regular and unlabored, skin warm and dry. A: 1:1 continuous monitoring for safety/SI.  R: No needs at this time.

## 2014-12-07 NOTE — Progress Notes (Signed)
D:Pt is in the shower. A:Pt is being monitored 1:1. R:Safety maintained on the unit.

## 2014-12-07 NOTE — Progress Notes (Signed)
D:Pt has been out of her room in the dayroom interacting with peers. Pt is currently working on a puzzle with staff and peers. Pt reports that she has been less depressed since attending group yesterday that was lead by "Nicki Reaper" from [facility].  A:Offered support, encouragement and 1:1 observation. R:Safety maintained on the unit.

## 2014-12-07 NOTE — Progress Notes (Signed)
This Pryor Curia had a brief conversation with the patient while the tech assigned to her took a break. The patient recalled how she remembered the seizure that she suffered on Wednesday and stated that she remembers shaking repeatedly and vomiting in the ambulance over and over again. She stated that she has had seizures in the past, but none were as severe as the one she experienced the other day. She also recalled past battles with medication, one of which she voluntarily took herself off of four medications at once (Morphine, Soma, Clonopin, and ?). The patient mentioned that she does not intend to die from suicide and prefers to work on her sobriety. She indicated that she was sober for three and a half years and that she is willing to work on her sobriety once again. Incidentally, when the patient returned to the hallway after being released from the hospital, she asked if anyone was upset with her from Wednesday's incident.
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
## 2014-12-08 LAB — BASIC METABOLIC PANEL
ANION GAP: 11 (ref 5–15)
BUN: 9 mg/dL (ref 6–23)
CALCIUM: 9.6 mg/dL (ref 8.4–10.5)
CO2: 25 mmol/L (ref 19–32)
Chloride: 102 mmol/L (ref 96–112)
Creatinine, Ser: 0.85 mg/dL (ref 0.50–1.10)
GFR calc non Af Amer: 80 mL/min — ABNORMAL LOW (ref >90)
GLUCOSE: 87 mg/dL (ref 70–99)
Potassium: 4.9 mmol/L (ref 3.5–5.1)
SODIUM: 138 mmol/L (ref 135–145)

## 2014-12-08 MED ORDER — LORAZEPAM 2 MG/ML IJ SOLN
2.0000 mg | Freq: Once | INTRAMUSCULAR | Status: AC
  Administered 2014-12-08: 2 mg via INTRAMUSCULAR

## 2014-12-08 MED ORDER — ACETAMINOPHEN 325 MG PO TABS
650.0000 mg | ORAL_TABLET | Freq: Once | ORAL | Status: DC
  Filled 2014-12-08: qty 2

## 2014-12-08 NOTE — Discharge Instructions (Signed)
Transfer back to inpatient bed at [facility].

## 2014-12-08 NOTE — Progress Notes (Signed)
Patient ID: [name], female   DOB: 1966-07-01, 49 y.o.   MRN: [id number] D-Had requested prn Robaxin and Klonopin after lunch. States she is upset by all the conversation and noise on the unit, especially from her roommate. Reported benefit from the prn's. She has low back pain from degenerative disc and joint pain, the reason for the Robaxin. Went down to gym and Stage manager played the corn hole game with her and she enjoyed that and reports feeling more relaxed. A-Support offered. Continue on close observation for recent suicide attempt on the unit. Denies thoughts to hurt self now. R-No complaints voiced at this time.

## 2014-12-08 NOTE — Progress Notes (Signed)
Patient ID: [name], female   DOB: 1966-05-23, 49 y.o.   MRN: [id number] Continue with close obs status for her safety. She is bothered today about all of the noise on the unit and she is feeling stressed by her roommate. Asked if she wanted to or to have me speak with charge nurse about a different room assignment and said no. She has been in her room for the quiet with her the tech, but still says her roommate comes in frequently and complains and it hard on her. She is pleasant and appropriate with 1:1 talks and brightens and then once done, returns to sullen affect. Continues to deny any thoughts to hurt self and is able to contract for safety.

## 2014-12-08 NOTE — ED Notes (Signed)
Dr. Ashok Cordia notified of pt's BP.  In agreement to obtain manual BP when pt is calm.

## 2014-12-08 NOTE — [facility] Group Notes (Signed)
[place] Group Notes:  (Nursing/MHT/Case Management/Adjunct)  Date:  12/08/2014  Time: 0900 am  Type of Therapy:  Psychoeducational Skills  Participation Level:  Active  Participation Quality:  Appropriate and Attentive  Affect:  Appropriate  Cognitive:  Alert and Appropriate  Insight:  Good  Engagement in Group:  Supportive  Modes of Intervention:  Support  Summary of Progress/Problems:  Zipporah Plants 12/08/2014, 2:29 PM

## 2014-12-08 NOTE — ED Notes (Signed)
Bed: OA41 Expected date: 12/08/14 Expected time: 6:55 PM Means of arrival: Ambulance Comments: tx from [facility] seizures not verbally responding

## 2014-12-08 NOTE — Progress Notes (Signed)
[place] Group Notes:  (Nursing/MHT/Case Management/Adjunct)  Date:  12/08/2014  Time:  12:15 AM  Type of Therapy:  Group Therapy  Participation Level:  Did Not Attend  Participation Quality:  Did Not Attend  Affect:  Did Not Attend  Cognitive:  Did Not Attend  Insight:  None  Engagement in Group:  Did Not Attend  Modes of Intervention:  Socialization and Support  Summary of Progress/Problems: Pt. Was resting in bed.  Lanell Persons 12/08/2014, 12:15 AM

## 2014-12-08 NOTE — Progress Notes (Signed)
Patient ID: [name], female   DOB: [birth year], 49 y.o.   MRN: [id number] D)  Has been sleeping tonight, eyes closed, resp reg, unlabored, no c/o's voiced. A) Will continue to monitor 1:1 for safety with mht at bedside R) remains safe at this time.

## 2014-12-08 NOTE — Progress Notes (Signed)
Patient ID: [name], female   DOB: 08-12-1966, 49 y.o.   MRN: [id number] D-Continues on a 1:1 for patient safety. Attempted to hurt self yesterday by tying a sheet around her neck and trying to choke herself. This am she continues to be asleep and allowed to continue.  A-Continue to monitor for safety on a 1:1. R-Sleeping with her tech present at bedside.

## 2014-12-08 NOTE — Progress Notes (Signed)
Patient ID: [name], female   DOB: [birth year], 49 y.o.   MRN: [id number] [facility] MD Progress Note  12/08/2014 11:45 AM Genieve Ramaswamy  MRN:  [id number]  Subjective: Patient states that she is doing better; denies suicidal/homicidal ideation, psychosis, and paranoia.  Patient continues to state that she is ready to be taking off of 1:1 observation.      Objective :  Discussed with patient that she would be put on continuous visual observation today which would give her more space but she would remain in visual contact through out the day and if she continued to do well we could put her back on the 15 minute checks.  Patient in agreement with order change stating that she would continue to do her part to show that she is feeling better and not suicidal.  Discussed with Dr. Doyne Keel and nursing staff all in agreement with changing order to continuous visual.  Patient continues to tolerate medications without adverse affect and attend group sessions    Principal Problem: MDD (major depressive disorder), recurrent severe, without psychosis Diagnosis:   Patient Active Problem List   Diagnosis Date Noted  . MDD (major depressive disorder), recurrent severe, without psychosis [F33.2] 11/29/2014  . Panic disorder [F41.0] 11/29/2014  . Agoraphobia [F40.00] 11/29/2014  . ARF (acute renal failure) [N17.9] 11/25/2014  . Intentional acetaminophen overdose [T39.1X2A] 10/26/2014  . Major depressive disorder, recurrent, severe without psychotic features [F33.2]   . Insomnia [G47.00]   . Chronic pain syndrome [G89.4]   . Bipolar 1 disorder, mixed, severe [F31.63] 10/09/2014  . Acute blood loss anemia [D62] 10/07/2014  . Liver failure, acute [K72.00]   . GI bleed [K92.2] 10/06/2014  . Duodenal ulcer hemorrhage [K26.4] 10/06/2014  . Vomiting [R11.10] 10/05/2014  . Acute hepatitis [K72.00] 10/05/2014  . Acute renal insufficiency [N28.9] 10/05/2014  . Seizure disorder [G40.909] 10/05/2014  . Hyperammonemia  [E72.20] 10/05/2014  . Suicidal ideation [R45.851] 10/05/2014  . Bipolar disorder [F31.9] 10/01/2014  . Major depression [F32.2] 10/01/2014  . Nausea and vomiting [R11.2] 09/30/2014  . Diarrhea [R19.7] 09/30/2014  . SIRS (systemic inflammatory response syndrome) [A41.9] 07/15/2014  . Tylenol overdose [T39.1X4A] 07/13/2014  . Hypokalemia [E87.6] 07/13/2014   Total Time spent with patient: 20 minutes  Past Medical History:  Past Medical History  Diagnosis Date  . Migraines   . Endometriosis   . ETOH abuse     sober 3 1/2 years  . Anorexia   . Depression   . Anxiety   . DJD (degenerative joint disease) of cervical spine   . Seizures   . PICC (peripherally inserted central catheter) in place     rt neck    Past Surgical History  Procedure Laterality Date  . Knee surgery    . Abdominal surgery    . Cholecystectomy    . Appendectomy    . Abdominal hysterectomy    . Nasal sinus surgery    . Laproscopy    . Carpal tunnel release      2010  . Esophagogastroduodenoscopy N/A 10/06/2014    Procedure: ESOPHAGOGASTRODUODENOSCOPY (EGD);  Surgeon: Inda Castle, MD;  Location: [place];  Service: Endoscopy;  Laterality: N/A;   Family History:  Family History  Problem Relation Age of Onset  . Hypertension Mother   . Hyperlipidemia Mother   . Heart failure Father   . Cancer Other    Social History:  History  Alcohol Use No     History  Drug Use No  History   Social History  . Marital Status: Single    Spouse Name: N/A  . Number of Children: N/A  . Years of Education: N/A   Social History Main Topics  . Smoking status: Current Every Day Smoker -- 0.25 packs/day for 20 years    Types: Cigarettes  . Smokeless tobacco: Never Used  . Alcohol Use: No  . Drug Use: No  . Sexual Activity: Not on file   Other Topics Concern  . None   Social History Narrative   Additional History:    Sleep: improved   Appetite:   Improved   Assessment:    Musculoskeletal: Strength & Muscle Tone: within normal limits Gait & Station: normal Patient leans: N/A  Psychiatric Specialty Exam: Physical Exam  Constitutional: She is oriented to person, place, and time.  Neck: Normal range of motion.  Respiratory: Effort normal.  Musculoskeletal: Normal range of motion.  Neurological: She is alert and oriented to person, place, and time.    ROS  Blood pressure 153/88, pulse 95, temperature 98 F (36.7 C), temperature source Rectal, resp. rate 16, height 5' 1.5" (1.562 m), weight 58.514 kg (129 lb), SpO2 97 %.Body mass index is 23.98 kg/(m^2).  General Appearance: Well Groomed  Engineer, water::  Good  Speech:  Clear and Coherent  Volume:  Normal  Mood:   "I feel better"  Affect:  less constricted   Thought Process:  Linear  Orientation:  Full (Time, Place, and Person)  Thought Content:  "I'm doing my part to get better"  Suicidal Thoughts:  No.  Homicidal Thoughts:  No  Memory:  Recent and remote grossly intact   Judgement:  Fair  Insight:  Present  Psychomotor Activity:  Normal, no psychomotor agitation  Concentration:  Good  Recall:  Good  Fund of Knowledge:Good  Language: Good  Akathisia:  No  Handed:  Right  AIMS (if indicated):     Assets:  Communication Skills Desire for Improvement  ADL's:  Intact  Cognition: fair   Sleep:  Number of Hours: 6.25   Current Medications: Current Facility-Administered Medications  Medication Dose Route Frequency Provider Last Rate Last Dose  . alum & mag hydroxide-simeth (MAALOX/MYLANTA) 200-200-20 MG/5ML suspension 30 mL  30 mL Oral Q4H PRN Shaunna Rosetti B Haddy Mullinax, NP      . clonazePAM (KLONOPIN) tablet 0.5 mg  0.5 mg Oral TID PRN Encarnacion Slates, NP   0.5 mg at 12/08/14 1115  . FLUoxetine (PROZAC) capsule 60 mg  60 mg Oral Daily Jenne Campus, MD   60 mg at 12/08/14 0932  . gabapentin (NEURONTIN) capsule 200 mg  200 mg Oral TID Jenne Campus, MD   200 mg at 12/08/14 1135  . hydrOXYzine  (ATARAX/VISTARIL) tablet 25 mg  25 mg Oral QHS PRN Jenne Campus, MD   25 mg at 12/08/14 0719  . isometheptene-acetaminophen-dichloralphenazone (MIDRIN) capsule 1 capsule  1 capsule Oral Q6H PRN Jenne Campus, MD   1 capsule at 12/08/14 [phone]  . levETIRAcetam (KEPPRA) tablet 500 mg  500 mg Oral BID Merci Walthers B Yuriko Portales, NP   500 mg at 12/08/14 0934  . lisinopril (PRINIVIL,ZESTRIL) tablet 20 mg  20 mg Oral Daily Encarnacion Slates, NP   20 mg at 12/08/14 0934  . magnesium hydroxide (MILK OF MAGNESIA) suspension 30 mL  30 mL Oral Daily PRN Ayako Tapanes B Carlin Mamone, NP      . methocarbamol (ROBAXIN) tablet 500 mg  500 mg Oral Q8H PRN Myer Peer  Cobos, MD   500 mg at 12/08/14 0636  . multivitamin with minerals tablet 1 tablet  1 tablet Oral Daily Akhilesh Sassone B Markos Theil, NP   1 tablet at 12/08/14 0935  . neomycin-bacitracin-polymyxin (NEOSPORIN) ointment   Topical BID Niel Hummer, NP      . pantoprazole (PROTONIX) EC tablet 40 mg  40 mg Oral BID Remington Skalsky B Clydell Sposito, NP   40 mg at 12/08/14 0935    Lab Results:  Results for orders placed or performed during the hospital encounter of 11/28/14 (from the past 48 hour(s))  Glucose, capillary     Status: Abnormal   Collection Time: 12/06/14  6:13 PM  Result Value Ref Range   Glucose-Capillary 126 (H) 70 - 99 mg/dL   Comment 1 Notify RN   CBC with Differential/Platelet     Status: Abnormal   Collection Time: 12/06/14  8:11 PM  Result Value Ref Range   WBC 9.8 4.0 - 10.5 K/uL    Comment: WHITE COUNT CONFIRMED ON SMEAR   RBC 3.79 (L) 3.87 - 5.11 MIL/uL   Hemoglobin 12.4 12.0 - 15.0 g/dL   HCT 37.2 36.0 - 46.0 %   MCV 98.2 78.0 - 100.0 fL   MCH 32.7 26.0 - 34.0 pg   MCHC 33.3 30.0 - 36.0 g/dL   RDW 13.8 11.5 - 15.5 %   Platelets  150 - 400 K/uL    PLATELET CLUMPS NOTED ON SMEAR, COUNT APPEARS INCREASED    Comment: PLATELET COUNT CONFIRMED BY SMEAR   Neutrophils Relative % 41 (L) 43 - 77 %   Neutro Abs 4.0 1.7 - 7.7 K/uL   Lymphocytes Relative 48 (H) 12 - 46 %   Lymphs  Abs 4.7 (H) 0.7 - 4.0 K/uL   Monocytes Relative 8 3 - 12 %   Monocytes Absolute 0.8 0.1 - 1.0 K/uL   Eosinophils Relative 2 0 - 5 %   Eosinophils Absolute 0.2 0.0 - 0.7 K/uL   Basophils Relative 0 0 - 1 %   Basophils Absolute 0.0 0.0 - 0.1 K/uL  Comprehensive metabolic panel     Status: Abnormal   Collection Time: 12/06/14  8:11 PM  Result Value Ref Range   Sodium 140 135 - 145 mmol/L   Potassium 4.7 3.5 - 5.1 mmol/L   Chloride 105 96 - 112 mmol/L   CO2 22 19 - 32 mmol/L   Glucose, Bld 95 70 - 99 mg/dL   BUN 7 6 - 23 mg/dL   Creatinine, Ser 0.84 0.50 - 1.10 mg/dL   Calcium 9.6 8.4 - 10.5 mg/dL   Total Protein 7.5 6.0 - 8.3 g/dL   Albumin 4.5 3.5 - 5.2 g/dL   AST 42 (H) 0 - 37 U/L   ALT 31 0 - 35 U/L   Alkaline Phosphatase 92 39 - 117 U/L   Total Bilirubin 0.9 0.3 - 1.2 mg/dL   GFR calc non Af Amer 81 (L) >90 mL/min   GFR calc Af Amer >90 >90 mL/min    Comment: (NOTE) The eGFR has been calculated using the CKD EPI equation. This calculation has not been validated in all clinical situations. eGFR's persistently <90 mL/min signify possible Chronic Kidney Disease.    Anion gap 13 5 - 15  CK     Status: None   Collection Time: 12/06/14  8:11 PM  Result Value Ref Range   Total CK 93 7 - 177 U/L    Physical Findings: AIMS: Facial and Oral Movements Muscles  of Facial Expression: None, normal Lips and Perioral Area: None, normal Jaw: None, normal Tongue: None, normal,Extremity Movements Upper (arms, wrists, hands, fingers): None, normal Lower (legs, knees, ankles, toes): None, normal, Trunk Movements Neck, shoulders, hips: None, normal, Overall Severity Severity of abnormal movements (highest score from questions above): None, normal Incapacitation due to abnormal movements: None, normal Patient's awareness of abnormal movements (rate only patient's report): No Awareness, Dental Status Current problems with teeth and/or dentures?: Yes Does patient usually wear dentures?: No   CIWA:    COWS:      Assessment-   Patient is improved . Today she is presenting with improved mood, improved range of affect, decreased hopelessness, and less irritability. She states she has made decision to " not consider suicide as an option ever again, even when things are difficult". She is tolerating medications well.   Treatment Plan Summary: Daily contact with patient to assess and evaluate symptoms and progress in treatment and Medication management:  Robaxin PRNS for  muscular pain Prozac 60 mgrs QDAY Increase Neurontin  200 mgrs  TID - history of good response and tolerance to this medication. Patient on Keppra  Start Vistaril 25 mgrs QHS PRN Insomnia .  Discontinue 1:1 observation at this time.  Discontinue 1:1 observation and start continues visual observation.  Patient may go to lunch room and out doors for physical activity.  Will continue with treatment plan.    Medical Decision Making:  Established Problem, Stable/Improving (1), Review of Psycho-Social Stressors (1) and Review of Medication Regimen & Side Effects (2)  Jermarion Poffenberger, FNP-BC 12/08/2014, 11:45 AM

## 2014-12-08 NOTE — Progress Notes (Signed)
Patient ID: [name], female   DOB: 1966-02-19, 49 y.o.   MRN: [id number] Sitting in dayroom next to the tech that has been keeping a close eye on her and it was noted by a peer RN that she wasn't looking right. She was leaning to the left with her head on the wall, gritting her teeth and had a mild tremor throughout her body.She was not responding to verbal prompts and eyes seemed unfocused. Dr Lucita Lora notified and an order for Ativan 2mg  was given and then administered to patient IM in Right deltoid and started to respond seconds later. Soft speaking, but not confused. Complained of nausea and a headache. She had a prn earlier today for headache, and also Robaxin for complaints of body aches. Was given Klonopin twice today for her complaint of anxiety. She had complained several times today of being bothered by the level of noise on the unit and it has been loud. Vitals taken and stable, O2 sats at 98 % and was put on 6 liters of O2. Color improved and was responding appropriately to questions asked. EMS called and came to take her to the ED for med clearance.She was reluctant to go, needed some encouragement to go. She said she didn't want to because it was too much trouble. Did go though with encouragement with EMS.Asked her if there was someone I could call for her to let them know what was happening but she declined me to call anyone.

## 2014-12-08 NOTE — ED Notes (Signed)
Pt is angry that she has not been given pain medication and would like to leave not returning to [facility].  Dr. Ashok Cordia is aware, [name] Malta, CN aware and spoke to CN at [facility].

## 2014-12-08 NOTE — ED Provider Notes (Signed)
CSN: [id number]     Arrival date & time 12/04/14  1618 History   First MD Initiated Contact with Patient 12/04/14 1711     Chief Complaint  Patient presents with  . Seizures     (Consider location/radiation/quality/duration/timing/severity/associated sxs/prior Treatment) Patient is a 49 y.o. female presenting with seizures. The history is provided by the patient. The history is limited by the condition of the patient.  Seizures pt with hx psychiatric illness currently admitted to behavioral health hospital w depression, SI, overdose, and hx seizure disorder, presents from [facility] w concern seizure.  On arrival to room, pt is squeezing her eyes closed, contracting arms up and holding them tightly to chest wall.  Pt very poor/difficult/uncooperative historian - level 5 caveat.      Past Medical History  Diagnosis Date  . Migraines   . Endometriosis   . ETOH abuse     sober 3 1/2 years  . Anorexia   . Depression   . Anxiety   . DJD (degenerative joint disease) of cervical spine   . Seizures   . PICC (peripherally inserted central catheter) in place     rt neck   Past Surgical History  Procedure Laterality Date  . Knee surgery    . Abdominal surgery    . Cholecystectomy    . Appendectomy    . Abdominal hysterectomy    . Nasal sinus surgery    . Laproscopy    . Carpal tunnel release      2010  . Esophagogastroduodenoscopy N/A 10/06/2014    Procedure: ESOPHAGOGASTRODUODENOSCOPY (EGD);  Surgeon: Inda Castle, MD;  Location: [place];  Service: Endoscopy;  Laterality: N/A;   Family History  Problem Relation Age of Onset  . Hypertension Mother   . Hyperlipidemia Mother   . Heart failure Father   . Cancer Other    History  Substance Use Topics  . Smoking status: Current Every Day Smoker -- 0.25 packs/day for 20 years    Types: Cigarettes  . Smokeless tobacco: Never Used  . Alcohol Use: No   OB History    No data available        Review of Systems  Unable to  perform ROS: Psychiatric disorder  Neurological: Positive for seizures.  pt uncooperative, level 5 caveat.      Allergies  Aspirin; Doxycycline; Imitrex; Iodinated diagnostic agents; Lidocaine; Prednisone; Sulfa antibiotics; Sulfasalazine; Sumatriptan; Tramadol; Levofloxacin; Latex; Levofloxacin; Metrizamide; Nsaids; and Tolmetin  Home Medications   Prior to Admission medications   Medication Sig Start Date End Date Taking? Authorizing Provider  busPIRone (BUSPAR) 7.5 MG tablet Take 1 tablet (7.5 mg total) by mouth 2 (two) times daily. 10/19/14  Yes Knox Royalty, NP  Calcium Carbonate-Vitamin D (CALCIUM-VITAMIN D3 PO) Take 1 capsule by mouth daily.   Yes Historical Provider, MD  carisoprodol (SOMA) 350 MG tablet Take 1 tablet (350 mg total) by mouth at bedtime. Patient taking differently: Take 350 mg by mouth 2 (two) times daily.  10/19/14  Yes Knox Royalty, NP  clonazePAM (KLONOPIN) 1 MG tablet Take 1 tablet (1 mg total) by mouth 3 (three) times daily as needed (anxiety). 10/19/14  Yes Knox Royalty, NP  diphenhydrAMINE (BENADRYL) 25 MG tablet Take 25 mg by mouth every 6 (six) hours as needed for itching or allergies.   Yes Historical Provider, MD  FLUoxetine (PROZAC) 20 MG capsule Take 1 capsule (20 mg total) by mouth daily. 10/19/14  Yes Knox Royalty, NP  gabapentin (  NEURONTIN) 100 MG capsule Take 1 capsule (100 mg total) by mouth 3 (three) times daily. 10/19/14  Yes Knox Royalty, NP  levETIRAcetam (KEPPRA) 500 MG tablet Take 1 tablet (500 mg total) by mouth 2 (two) times daily. 10/19/14  Yes Knox Royalty, NP  Multiple Vitamin (MULTIVITAMIN WITH MINERALS) TABS tablet Take 1 tablet by mouth daily.   Yes Historical Provider, MD  pantoprazole (PROTONIX) 40 MG tablet Take 1 tablet (40 mg total) by mouth 2 (two) times daily. 10/19/14  Yes Knox Royalty, NP   BP 158/104 mmHg  Pulse 127  Temp(Src) 98 F (36.7 C) (Rectal)  Resp 20  Ht 5' 1.5" (1.562 m)  Wt 129 lb (58.514 kg)  BMI 23.98  kg/m2  SpO2 97% Physical Exam  Constitutional: She appears well-developed and well-nourished.  Pt anxious appearing, squeezing eyes closed.  HENT:  Head: Atraumatic.  Mouth/Throat: Oropharynx is clear and moist.  No oral/tongue trauma.   Eyes: Conjunctivae are normal. Pupils are equal, round, and reactive to light. No scleral icterus.  Neck: Neck supple. No tracheal deviation present.  No stiffness or rigidity.   Cardiovascular: Regular rhythm, normal heart sounds and intact distal pulses.   Pulmonary/Chest: Effort normal and breath sounds normal. No respiratory distress.  Abdominal: Soft. Normal appearance and bowel sounds are normal. She exhibits no distension. There is no tenderness.  Musculoskeletal: She exhibits no edema or tenderness.  Neurological: She is alert. No cranial nerve deficit.  During 'seizure' episode, can distract and redirect patient, w squinting of eyes, change in shaking pattern of arms.  You can hold and extend pts arms, and when holding above head/face she will gradually move arms around head and gently placed back on stretcher.   Moves bil ext with good strength.  Skin: Skin is warm and dry. No rash noted. She is not diaphoretic.  Psychiatric:  ?anxiety episode. Alert,content on recheck.   Nursing note and vitals reviewed.   ED Course  Procedures (including critical care time) Labs Review                                                                                                                                                                                                                                    Basic metabolic panel  Result Value Ref Range   Sodium 138 135 - 145 mmol/L   Potassium 4.9 3.5 - 5.1 mmol/L   Chloride  102 96 - 112 mmol/L   CO2 25 19 - 32 mmol/L   Glucose, Bld 87 70 - 99 mg/dL   BUN 9 6 - 23 mg/dL   Creatinine, Ser 0.85 0.50 - 1.10 mg/dL   Calcium 9.6 8.4 - 10.5 mg/dL   GFR  calc non Af Amer 80 (L) >90 mL/min   GFR calc Af Amer >90 >90 mL/min   Anion gap 11 5 - 15         MDM   Iv ns. Labs.  Reviewed nursing notes and prior charts for additional history.   Recheck, no seizure or any voluntary contractures. Pt conversant. No new c/o.  Pts earlier symptoms consistent with functional disorder and/or anxiety/stress reaction, with voluntary closure of eyes and voluntary movements of upper extremities.   Bmet normal.  Prior labs unremarkable.  Pt currently appears stable for transfer back to [facility].    Lajean Saver, MD 12/08/14 2201

## 2014-12-08 NOTE — Progress Notes (Signed)
[facility] 1:1 Observation Documentation  For the first (8) hours following discontinuation of 1:1 precautions, a progress note entry by nursing staff should be documented at least every 2 hours, reflecting the patient's behavior, condition, mood, and conversation.  Use the progress notes for additional entries.  Time 1:1 discontinued:   1128 Patient's Behavior:  Anxious, states having someone with her all the time, especially the sitter yesterday who was right on top of her made her more anxious. Wants to be able to go off unit for meals and exercise. Gave her a prn of Klonopin at her request minutes ago.  Patient's Condition:  stable Patient's Conversation:  Wanting to be off 1:1 spoke with NP in writers presence. She is able to contract for safety. Agreed to step her down from a 1:1 to close observation. All agreeable to this. And also is able per NP to go off the unit for meals and recreation.   Davina Poke 12/08/2014, 11:28 AM

## 2014-12-08 NOTE — ED Notes (Addendum)
Pt is now calm BP is 130/81.  Pt c/o HA and jaw pain. Dr. Ashok Cordia aware of pt's request for pain medication and desire to go back to [facility].

## 2014-12-08 NOTE — Progress Notes (Signed)
Patient ID: [name], female   DOB: 05-30-1966, 49 y.o.   MRN: [id number] D-Awakened to attend am group. She acknowledged her goal today to get off her 1:1 and to focus on what she has to do and can do. She is pleasant. Complains of fatigue and headache she relates to post seizure from yesterday. A-Prn given for complaint of H/A of an 8. Continue to monitor for safety. Medications as ordered. R-Only complaint this am is headache, States her day was more difficult yesterday because she didn't like the tech she had doing the 1:1, more content today.

## 2014-12-08 NOTE — [facility] Group Notes (Signed)
[place] Group Notes:  (Clinical Social Work)  12/08/2014  1:15-2:45pm  Summary of Progress/Problems:   The main focus of today's process group was to   1)  Discuss the importance of adding supports  2)  Experience various pieces of music and discuss feelings evoked  3)  Identify the patient's current healthy supports and plan what to add.  An emphasis was placed on using counselor, doctor, therapy groups, 12-step groups, and problem-specific support groups to expand supports.  Suggestions were generated by group about non-professional supports that could possibly be added, including animals, volunteer work, music, coloring, hobbies, church, etc.  The patient expressed full comprehension of the concepts presented, and agreed that there is a need to add more supports.  The patient had flat affect and looked down during most of group, but did interact with others, and was engaged while music was being played.  CSW was supportive to pt in having the right to all her emotions.  Type of Therapy:  Processing Group  Participation Level:  Active  Participation Quality:  Attentive and Sharing  Affect:  Depressed and Flat  Cognitive:  Oriented  Insight:  Developing/Improving  Engagement in Therapy:  Engaged  Modes of Intervention:    Activity and Processing  Selmer Dominion, LCSW 12/08/2014, 3:40pm

## 2014-12-08 NOTE — Progress Notes (Signed)
Patient ID: [name], female   DOB: 03-19-1966, 49 y.o.   MRN: [id number] D)  Currentlty sleeping, but had been up and about earlier this evening.  Came out to the dayroom, interacting appropriately with staff and select peers.  States had gotten a phone call from her sister, and that had been the trigger for her suicide attempt with the noose.  Able to talk about her frustrations with her siblings in caring for their mother.  Sister said that because she didn't pay the bill, their mother was going to have to come back home, has dementia.  Family upset with her, couldn't deal with their criticism.  States she knows she made a bad decision, owns it, and is able to contract for safety at this time.  Will make staff aware of any thoughts of self harm. A)  Will continue to monitor for safety, remains on 1:1 obs with mht at bedside, support and encouragement, meds as ordered. R)  Safety maintained.

## 2014-12-09 MED ORDER — ONDANSETRON 4 MG PO TBDP
4.0000 mg | ORAL_TABLET | Freq: Three times a day (TID) | ORAL | Status: DC | PRN
  Administered 2014-12-09 – 2014-12-10 (×3): 4 mg via ORAL
  Filled 2014-12-09 (×3): qty 1

## 2014-12-09 MED ORDER — GABAPENTIN 300 MG PO CAPS
300.0000 mg | ORAL_CAPSULE | Freq: Three times a day (TID) | ORAL | Status: DC
  Administered 2014-12-09 – 2014-12-10 (×4): 300 mg via ORAL
  Filled 2014-12-09 (×8): qty 1

## 2014-12-09 MED ORDER — HYDROXYZINE HCL 25 MG PO TABS
25.0000 mg | ORAL_TABLET | Freq: Four times a day (QID) | ORAL | Status: DC | PRN
  Administered 2014-12-09 – 2014-12-10 (×2): 25 mg via ORAL
  Filled 2014-12-09 (×2): qty 1

## 2014-12-09 MED ORDER — METHOCARBAMOL 500 MG PO TABS
750.0000 mg | ORAL_TABLET | Freq: Three times a day (TID) | ORAL | Status: DC | PRN
  Administered 2014-12-09 – 2014-12-10 (×3): 750 mg via ORAL
  Filled 2014-12-09 (×3): qty 1

## 2014-12-09 NOTE — Clinical Social Work Note (Signed)
Patient assigned Paula Compton as care coordinator from [place] [phone]).  Edwyna Shell, LCSW Clinical Social Worker

## 2014-12-09 NOTE — Progress Notes (Signed)
Patient ID: [name], female   DOB: 21-Jul-1966, 49 y.o.   MRN: [id number] D)  Has been resting quietly tonight, eyes closed, resp reg, unlabored, no c/o's voiced. A)  Remains on close obs for safety, mht at bedside. R)  Remains safe at this time.

## 2014-12-09 NOTE — Progress Notes (Signed)
Patient ID: [name], female   DOB: 1966-01-25, 49 y.o.   MRN: [id number]  Close Observation Note-  Patient is seen in the cafeteria sitting with her peers and eating dinner. Patient is carrying on a pleasant conversation and enjoying her meal. Patient's respirations are even and unlabored with no apparent distress. Close observation is continued and Q15 minute safety checks are maintained.

## 2014-12-09 NOTE — Progress Notes (Signed)
Patient ID: [name], female   DOB: 21-Oct-1965, 49 y.o.   MRN: [id number]  Close Observation Note-  Patient is seen sitting in the day room attending the social work processing group. Patient appears engaged and is holding a conversation with her peers. Patient's respirations are even and unlabored at this time. Close observation is continued for safety and Q15 minute safety checks are maintained.

## 2014-12-09 NOTE — BHH Group Notes (Signed)
[place] LCSW Group Therapy          Overcoming Obstacles       1:15 -2:30        12/09/2014   4:01 PM   Type of Therapy:  Group Therapy  Participation Level:  Appropriate  Participation Quality:  Appropriate  Affect:  Depressed  Cognitive:   Appropriate  Insight: Developing/Improving  Engagement in Therapy: Developing/Imprvoing   Modes of Intervention:  Discussion Exploration  Education Rapport BuildingProblem-Solving Support  Summary of Progress/Problems:  The main focus of today's group was overcoming obstacles. Patient advised she has been effected by the chaos of the weekend and finding it difficult to get back on track. Patient encouraged not to allow negative experiences on the unit to cause her to lose focus on her determination last week that suicide is not an option.  Patient stated she is trying to stay focused but it is difficult.  [name] 12/09/2014   4:01 PM

## 2014-12-09 NOTE — Progress Notes (Signed)
Recreation Therapy Notes  Date: 03.21.2016 Time: 9:30am Location: [address] Dayroom   Group Topic: Stress Management  Goal Area(s) Addresses:  Patient will actively participate in stress management techniques presented during session.   Behavioral Response: Engaged, Attentive.   Intervention: Stress Management Technique  Activity :  Tapping. Patients were instructed on tapping technique, using physically tapping chi zones and using positive mantra.   Education:  Stress Management, Discharge Planning.   Education Outcome: Acknowledges education  Clinical Observations/Feedback: Patient actively engaged in technique, expressing no difficulty. Patient reported after completion she felt she could practice at home and that she would add it to the tool box she is building inpatient.    Laureen Ochs Analei Whinery, LRT/CTRS  Zoran Yankee L 12/09/2014 10:19 AM

## 2014-12-09 NOTE — Progress Notes (Signed)
Patient ID: [name], female   DOB: [birth year], 49 y.o.   MRN: [id number] [facility] MD Progress Note  12/09/2014 6:29 PM [name]  MRN:  [id number]  Subjective:  She reports she is doing better. She denies medication side effects. She tends to ruminate about observation level ( current on Constant Observation Status ) . States she has difficulty with this due to inherent lack of privacy when changing , etc .       Objective :  Patient case discussed with treatment team and patient seen. Patient presents improved compared to last week but still somewhat irritable, depressed. States she is feeling better. Tends to ruminate about her observation level. We discussed this and reviewed its goal as to help provide support and safety, particularly in light of recent suicide attempt on unit and recent seizures . Patient expressed understanding. She is tolerating medications well and at present denies side effects. No disruptive behaviors on unit. No seizures today- patient states she is upset because " when I last went to the ED , the staff seemed to think I was making it up, doing it on purpose ". Has been on Kepra for a long time and denies side effects.  BMP within normal limits       Principal Problem: MDD (major depressive disorder), recurrent severe, without psychosis Diagnosis:   Patient Active Problem List   Diagnosis Date Noted  . MDD (major depressive disorder), recurrent severe, without psychosis [F33.2] 11/29/2014  . Panic disorder [F41.0] 11/29/2014  . Agoraphobia [F40.00] 11/29/2014  . ARF (acute renal failure) [N17.9] 11/25/2014  . Intentional acetaminophen overdose [T39.1X2A] 10/26/2014  . Major depressive disorder, recurrent, severe without psychotic features [F33.2]   . Insomnia [G47.00]   . Chronic pain syndrome [G89.4]   . Bipolar 1 disorder, mixed, severe [F31.63] 10/09/2014  . Acute blood loss anemia [D62] 10/07/2014  . Liver failure, acute [K72.00]   . GI bleed [K92.2]  10/06/2014  . Duodenal ulcer hemorrhage [K26.4] 10/06/2014  . Vomiting [R11.10] 10/05/2014  . Acute hepatitis [K72.00] 10/05/2014  . Acute renal insufficiency [N28.9] 10/05/2014  . Seizure disorder [G40.909] 10/05/2014  . Hyperammonemia [E72.20] 10/05/2014  . Suicidal ideation [R45.851] 10/05/2014  . Bipolar disorder [F31.9] 10/01/2014  . Major depression [F32.2] 10/01/2014  . Nausea and vomiting [R11.2] 09/30/2014  . Diarrhea [R19.7] 09/30/2014  . SIRS (systemic inflammatory response syndrome) [A41.9] 07/15/2014  . Tylenol overdose [T39.1X4A] 07/13/2014  . Hypokalemia [E87.6] 07/13/2014   Total Time spent with patient: 20 minutes  Past Medical History:  Past Medical History  Diagnosis Date  . Migraines   . Endometriosis   . ETOH abuse     sober 3 1/2 years  . Anorexia   . Depression   . Anxiety   . DJD (degenerative joint disease) of cervical spine   . Seizures   . PICC (peripherally inserted central catheter) in place     rt neck    Past Surgical History  Procedure Laterality Date  . Knee surgery    . Abdominal surgery    . Cholecystectomy    . Appendectomy    . Abdominal hysterectomy    . Nasal sinus surgery    . Laproscopy    . Carpal tunnel release      2010  . Esophagogastroduodenoscopy N/A 10/06/2014    Procedure: ESOPHAGOGASTRODUODENOSCOPY (EGD);  Surgeon: Inda Castle, MD;  Location: [place];  Service: Endoscopy;  Laterality: N/A;   Family History:  Family History  Problem Relation Age  of Onset  . Hypertension Mother   . Hyperlipidemia Mother   . Heart failure Father   . Cancer Other    Social History:  History  Alcohol Use No     History  Drug Use No    History   Social History  . Marital Status: Single    Spouse Name: N/A  . Number of Children: N/A  . Years of Education: N/A   Social History Main Topics  . Smoking status: Current Every Day Smoker -- 0.25 packs/day for 20 years    Types: Cigarettes  . Smokeless tobacco: Never  Used  . Alcohol Use: No  . Drug Use: No  . Sexual Activity: Not on file   Other Topics Concern  . None   Social History Narrative   Additional History:    Sleep:  Good   Appetite:   Improved   Assessment:   Musculoskeletal: Strength & Muscle Tone: within normal limits Gait & Station: normal Patient leans: N/A  Psychiatric Specialty Exam: Physical Exam  Constitutional: She is oriented to person, place, and time.  Neck: Normal range of motion.  Respiratory: Effort normal.  Musculoskeletal: Normal range of motion.  Neurological: She is alert and oriented to person, place, and time.    Review of Systems  Constitutional: Negative for fever and chills.  Respiratory: Negative for cough and shortness of breath.   Cardiovascular: Negative for chest pain.  Musculoskeletal: Negative.        Describes some muscle soreness , pain related to recent seizures    Neurological: Positive for seizures. Negative for focal weakness, loss of consciousness and headaches.    Blood pressure 126/96, pulse 87, temperature 97.8 F (36.6 C), temperature source Oral, resp. rate 20, height 5' 1.5" (1.562 m), weight 129 lb (58.514 kg), SpO2 96 %.Body mass index is 23.98 kg/(m^2).  General Appearance: Well Groomed  Engineer, water::  Good  Speech:  Clear and Coherent  Volume:  Normal  Mood:  Still " upset", but acknowledges improvement overall   Affect:  more reactive, smiles at times appropriately  Thought Process:  Linear  Orientation:  Full (Time, Place, and Person)  Thought Content:  Denies hallucinations, no delusions   Suicidal Thoughts:  No.- at this time denies plan or intention of hurting self   Homicidal Thoughts:  No  Memory:  Recent and remote grossly intact   Judgement:  Fair  Insight:  Present  Psychomotor Activity:  Normal, no psychomotor agitation  Concentration:  Good  Recall:  Good  Fund of Knowledge:Good  Language: Good  Akathisia:  No  Handed:  Right  AIMS (if indicated):      Assets:  Communication Skills Desire for Improvement  ADL's:  Intact  Cognition: fair   Sleep:  Number of Hours: 6.25   Current Medications: Current Facility-Administered Medications  Medication Dose Route Frequency Provider Last Rate Last Dose  . acetaminophen (TYLENOL) tablet 650 mg  650 mg Oral Once Lajean Saver, MD   650 mg at 12/08/14 2152  . alum & mag hydroxide-simeth (MAALOX/MYLANTA) 200-200-20 MG/5ML suspension 30 mL  30 mL Oral Q4H PRN Shuvon B Rankin, NP      . clonazePAM (KLONOPIN) tablet 0.5 mg  0.5 mg Oral TID PRN Encarnacion Slates, NP   0.5 mg at 12/09/14 0850  . FLUoxetine (PROZAC) capsule 60 mg  60 mg Oral Daily Jenne Campus, MD   60 mg at 12/09/14 0847  . gabapentin (NEURONTIN) capsule 300 mg  300  mg Oral TID Jenne Campus, MD   300 mg at 12/09/14 1721  . hydrOXYzine (ATARAX/VISTARIL) tablet 25 mg  25 mg Oral Q6H PRN Niel Hummer, NP   25 mg at 12/09/14 1447  . isometheptene-acetaminophen-dichloralphenazone (MIDRIN) capsule 1 capsule  1 capsule Oral Q6H PRN Jenne Campus, MD   1 capsule at 12/09/14 0846  . levETIRAcetam (KEPPRA) tablet 500 mg  500 mg Oral BID Shuvon B Rankin, NP   500 mg at 12/09/14 1721  . lisinopril (PRINIVIL,ZESTRIL) tablet 20 mg  20 mg Oral Daily Encarnacion Slates, NP   20 mg at 12/09/14 0847  . magnesium hydroxide (MILK OF MAGNESIA) suspension 30 mL  30 mL Oral Daily PRN Shuvon B Rankin, NP      . methocarbamol (ROBAXIN) tablet 750 mg  750 mg Oral Q8H PRN Jenne Campus, MD   750 mg at 12/09/14 1724  . multivitamin with minerals tablet 1 tablet  1 tablet Oral Daily Shuvon B Rankin, NP   1 tablet at 12/09/14 0847  . neomycin-bacitracin-polymyxin (NEOSPORIN) ointment   Topical BID Niel Hummer, NP      . ondansetron (ZOFRAN-ODT) disintegrating tablet 4 mg  4 mg Oral Q8H PRN Niel Hummer, NP   4 mg at 12/09/14 1144  . pantoprazole (PROTONIX) EC tablet 40 mg  40 mg Oral BID Shuvon B Rankin, NP   40 mg at 12/09/14 1721    Lab Results:  Results  for orders placed or performed during the hospital encounter of 11/28/14 (from the past 48 hour(s))  Basic metabolic panel     Status: Abnormal   Collection Time: 12/08/14  7:57 PM  Result Value Ref Range   Sodium 138 135 - 145 mmol/L   Potassium 4.9 3.5 - 5.1 mmol/L   Chloride 102 96 - 112 mmol/L   CO2 25 19 - 32 mmol/L   Glucose, Bld 87 70 - 99 mg/dL   BUN 9 6 - 23 mg/dL   Creatinine, Ser 0.85 0.50 - 1.10 mg/dL   Calcium 9.6 8.4 - 10.5 mg/dL   GFR calc non Af Amer 80 (L) >90 mL/min   GFR calc Af Amer >90 >90 mL/min    Comment: (NOTE) The eGFR has been calculated using the CKD EPI equation. This calculation has not been validated in all clinical situations. eGFR's persistently <90 mL/min signify possible Chronic Kidney Disease.    Anion gap 11 5 - 15    Physical Findings: AIMS: Facial and Oral Movements Muscles of Facial Expression: None, normal Lips and Perioral Area: None, normal Jaw: None, normal Tongue: None, normal,Extremity Movements Upper (arms, wrists, hands, fingers): None, normal Lower (legs, knees, ankles, toes): None, normal, Trunk Movements Neck, shoulders, hips: None, normal, Overall Severity Severity of abnormal movements (highest score from questions above): None, normal Incapacitation due to abnormal movements: None, normal Patient's awareness of abnormal movements (rate only patient's report): No Awareness, Dental Status Current problems with teeth and/or dentures?: Yes Does patient usually wear dentures?: No  CIWA:    COWS:      Assessment-    Partial improvement compared to admission. At this time no active SI and behavior on unit in good control. Remains vaguely irritable, but affect more reactive. Tolerating medications well.   Treatment Plan Summary: Daily contact with patient to assess and evaluate symptoms and progress in treatment and Medication management:  Increase Robaxin PRNS  To 750 mgrs , to address muscular aches  Prozac 60 mgrs  QDAY Increase Neurontin  To  300 mgrs  TID - history of good response and tolerance to this medication. Patient on [place] Decision Making:  Established Problem, Stable/Improving (1), Review of Psycho-Social Stressors (1) and Review of Medication Regimen & Side Effects (2)  Kit Mollett,  12/09/2014, 6:29 PM

## 2014-12-09 NOTE — Progress Notes (Signed)
Patient ID: [name], female   DOB: Jan 13, 1966, 49 y.o.   MRN: [id number]  Patient signed a NEW 72 hour request for discharge (this will be the third one during her hospitalization with [place]). This was signed 12/09/2014 at 1456.

## 2014-12-09 NOTE — BHH Group Notes (Signed)
[facility] LCSW Aftercare Discharge Planning Group Note   12/09/2014 10:00 AM    Participation Quality:  Appropraite  Mood/Affect:  Appropriate  Depression Rating:  7   Anxiety Rating:  10  Thoughts of Suicide:  Yes  Will you contract for safety?   Yes  Current AVH:  No  Plan for Discharge/Comments:  Patient attended discharge planning group and actively participated in group. Patient reported not having a good Sunday.  Outpatient follow up as previously noted.  Suicide prevention education reviewed and SPE document provided.   Transportation Means: Patient has transportation.   Supports:  Patient has a limited support system.   Joette Catching Hairston  12/09/2014  10:00 AM

## 2014-12-09 NOTE — Plan of Care (Signed)
Problem: Diagnosis: Increased Risk For Suicide Attempt Goal: STG-Patient Will Report Suicidal Feelings to Staff Outcome: Progressing Patient reports passive SI to writer and is able to contract for safety. Close observation is continued and Q15 minute safety checks maintained

## 2014-12-09 NOTE — Progress Notes (Signed)
Patient ID: [name], female   DOB: 1966-07-21, 49 y.o.   MRN: [id number]   Close Observation Note- Patient is seen in the dayroom playing with a puzzle at this time. Respirations are even and unlabored and no distress noted. Patient is not having a conversation at this time. MHT is seen in close proximity to patient and close observation is continued for this patient.   Patient reports passive SI to Probation officer and is able to contract for safety. Patient spoke 1:1 with patient about her suicide attempts and patient reports she knows that after her mother dies no one will care whether or not she is alive. Writer stated that was untrue and that staff at [facility] care about our patients. Patient states, "yeah, but yall have a lot of people come through here." Writer offered support to patient and reminded patient that she is of worth. Patient continues to have depressed affect and mood. Patient reports sleep was good, appetite is poor, energy level is normal, and concentration level is good. Patient reports anxiety 9/10 and received PRN Klonopin which she reports helped. Patient reports depression and hopelessness at 8/10 for the day. Patient was given journal and reported that was her goal for today is to journal. Q15 minute safety checks and close observation is continued.
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
## 2014-12-09 NOTE — Progress Notes (Signed)
Patient ID: [name], female   DOB: 02-26-1966, 49 y.o.   MRN: [id number] D)  Pt returned from the ER after being evaluated for seizure activity.  Pt had told the RN who had accompanied her that she had pain in her jaw from a couple of teeth that had cracked, tends to clench teeth, but reported to this writer that she was sore from seizure.  RN also reported that pt had been very tense in the ER and pt had reported she was having a seizure, but the ER MD had told her to relax, that she was too tense, and she had followed the command and relaxed.  On return, pt stopped at the med window to see if she had any meds due, and then went to her room and took a nap.  Came back later for hs meds, requested robaxin for muscle spasms and soreness, midrin for headache and toothache and klonopin for anxiety.  Affect was flat, sad, depressed, was able to contract for safety, denied thoughts of self harm. A)  Will continue to monitor for safety, remains on close obs with mht at bedside, continue POC R)  Safety maintained at this time.

## 2014-12-09 NOTE — Progress Notes (Signed)
Patient ID: [name], female   DOB: Feb 03, 1966, 49 y.o.   MRN: [id number] D)  Has been sleeping tonight, sometimes awake briefly, eyes closed, resp reg, unlabored, no c/o's voiced. A)  Remains on close obs with mht at bedside for safety, continue POC R)  Remains safe at this time.

## 2014-12-10 ENCOUNTER — Inpatient Hospital Stay (HOSPITAL_COMMUNITY): Payer: Federal, State, Local not specified - Other

## 2014-12-10 DIAGNOSIS — R569 Unspecified convulsions: Secondary | ICD-10-CM

## 2014-12-10 LAB — GLUCOSE, CAPILLARY: Glucose-Capillary: 100 mg/dL — ABNORMAL HIGH (ref 70–99)

## 2014-12-10 MED ORDER — LORAZEPAM 2 MG/ML IJ SOLN
INTRAMUSCULAR | Status: AC
  Administered 2014-12-10: 2 mg via INTRAMUSCULAR
  Filled 2014-12-10: qty 1

## 2014-12-10 MED ORDER — OXYCODONE-ACETAMINOPHEN 5-325 MG PO TABS
1.0000 | ORAL_TABLET | Freq: Four times a day (QID) | ORAL | Status: DC | PRN
  Administered 2014-12-10: 1 via ORAL
  Filled 2014-12-10: qty 1

## 2014-12-10 MED ORDER — BENZOCAINE 10 % MT GEL
Freq: Four times a day (QID) | OROMUCOSAL | Status: DC | PRN
  Administered 2014-12-10: 12:00:00 via OROMUCOSAL
  Filled 2014-12-10: qty 9.4

## 2014-12-10 MED ORDER — OXYCODONE HCL 5 MG PO TABS: 5.0000 mg | ORAL_TABLET | Freq: Four times a day (QID) | ORAL | Status: DC | PRN

## 2014-12-10 MED ORDER — LEVETIRACETAM IN NACL 1000 MG/100ML IV SOLN
1000.0000 mg | Freq: Once | INTRAVENOUS | Status: AC
  Administered 2014-12-10: 1000 mg via INTRAVENOUS
  Filled 2014-12-10: qty 100

## 2014-12-10 MED ORDER — LORAZEPAM 2 MG/ML IJ SOLN
INTRAMUSCULAR | Status: AC
  Filled 2014-12-10: qty 1

## 2014-12-10 NOTE — ED Notes (Signed)
Phlebotomy at bedside to attempt to obtain labs. Unable to obtain labs.

## 2014-12-10 NOTE — BHH Group Notes (Signed)
[facility] Group Notes:  (Nursing/MHT/Case Management/Adjunct)  Date:  12/10/2014  Time:  0900am  Type of Therapy:  Nurse Education  Participation Level:  Active  Participation Quality:  Appropriate and Attentive  Affect:  Anxious  Cognitive:  Alert and Appropriate  Insight:  Appropriate and Good  Engagement in Group:  Engaged  Modes of Intervention:  Discussion, Education and Support  Summary of Progress/Problems: Patient attended groups, remained engaged, and responded appropriately when prompted. Pt reports her goal for the day is "identify triggers for my PTSD."  Jimmye Norman, [place] A 12/10/2014, 10:25 AM

## 2014-12-10 NOTE — Progress Notes (Signed)
[facility] called at 2125 and spoke to Autumn RN to give Report

## 2014-12-10 NOTE — Progress Notes (Signed)
D: Patient is alert and oriented. Pt's mood and affect are anxious. Pt's mood is irritable and labile at times. Pt denies SI/HI and AVH. Pt reports left lower tooth chipped this morning. Pt is attending some groups. Pt complains of migraine this morning, 8/10. Pt complains of anxiety this morning. Pt rates depression, hopelessness, and anxiety 2/10. Pt reports her goal for the day is "identify triggers for my PTSD." Pt observed in fetal position at 1200, tearful, in floor in her room, d/t tooth pain and noise level (construction on unit), pt states "its too loud, my PTSD and I just want something for my tooth pain." Pt responds to RN questions with "I don't care, I don't know, just shoot me." Pt appears to be nauseous. Pt later reports improved mood after spending some time in quiet room this afternoon. Pt experiencing HTN today (See docflowsheet-vitals). Pt complains of migraine this afternoon, 9/10 and left lower mouth pain/tooth pain, 10/10 which decreased with PRN medication administration. Pt complains of muscle spasms this evening. A: Pt remains on close observation with RN present per providers orders (See additional notes). Will reassess BP frequently. MD made aware of pt's condition and concerns at 1200. Active listening by RN. Encouragement/Support provided to pt. PRN medication administered for pain, anxiety, mouth pain, nausea, and muscle spasms per providers orders (See MAR). Scheduled medications administered per providers orders (See MAR). 15 minute checks continued per protocol for patient safety.  R: Patient cooperative and receptive to nursing interventions. Pt remains safe.

## 2014-12-10 NOTE — Consult Note (Signed)
Neurology Consultation Reason for Consult: Seizures Referring Physician: Sabra Heck, B  CC: Seizures  History is obtained from:Patient, medical record.  HPI: [name] is a 49 y.o. female with reported history of seizures who presents with seizure from behavioral health. Reading reports from [facility] indicate that she underwent video-EEG at [facility] in 2000 with no seizure-like activity recorded. She then was episode free until last year. In June 2015 she was admitted here with episode after tramadol dose. She was discharged with presumed provoked seizure.  She then had continued fatigue and there was concern for possible unwitnessed seizures and she was started on keppra in the ED two weeks later.   She had attempted overdose with tylenol in Oct 2015 with prolonged hospitalization at [facility].   This time, she was admitted with SI and tylenol overdose on 03/07. After finding out she was no longer going to be on one-on-one observation on 3/18 had seizure like activity including bilateral rigidity and shaking with preserved consciousness. She was discharged back to [facility].   On 3/20, she had another episodes and was sent to the ED. ED physician noted tight eye closure and was redirectable during seizure. She was again diagnosed with conversion and transferred to [facility].  Today, she had an event that started as holding her hands beside her head with small amplitude shaking and unresponsiveness. She then slid to the ground and had jerking for approximately 15 minutes with arms outstretched. Eyes were closed and back was arched. Post-ictally was non-verbal at least until EMS arrived. She had no urinary incontinence.    ROS: A 14 point ROS was performed and is negative except as noted in the HPI.    Past Medical History  Diagnosis Date  . Migraines   . Endometriosis   . ETOH abuse     sober 3 1/2 years  . Anorexia   . Depression   . Anxiety   . DJD (degenerative joint disease) of cervical spine   . Seizures    . PICC (peripherally inserted central catheter) in place     rt neck    Family History: Father- head movements  Social History: Tob: quit this year.  Sober x 3 years.   Exam: Current vital signs: BP 138/99 mmHg  Pulse 106  Temp(Src) 97.7 F (36.5 C) (Oral)  Resp 18  Ht 5' 1.5" (1.562 m)  Wt 58.514 kg (129 lb)  BMI 23.98 kg/m2  SpO2 96% Vital signs in last 24 hours: Temp:  [97.7 F (36.5 C)-98.1 F (36.7 C)] 97.7 F (36.5 C) (03/22 2229) Pulse Rate:  [86-106] 106 (03/22 2147) Resp:  [18-26] 18 (03/22 2147) BP: (102-150)/(87-101) 138/99 mmHg (03/22 2147) SpO2:  [95 %-96 %] 96 % (03/22 2147)  Physical Exam  Constitutional: Appears well-developed and well-nourished.  Psych: Affect appropriate to situation Eyes: No scleral injection HENT: No OP obstrucion Head: Normocephalic.  Cardiovascular: Normal rate and regular rhythm.  Respiratory: Effort normal and breath sounds normal to anterior ascultation GI: Soft.  No distension. There is no tenderness.  Skin: WDI  Neuro: Mental Status: Patient is awake, alert, oriented to person, place, month, year, and situation. Patient is able to give a clear and coherent history. No signs of aphasia or neglect Cranial Nerves: II: Visual Fields are full. Pupils are equal, round, and reactive to light.   III,IV, VI: EOMI without ptosis or diploplia.  V: Facial sensation is symmetric to temperature VII: Facial movement is symmetric.  VIII: hearing is intact to voice X:  Uvula elevates symmetrically XI: Shoulder shrug is symmetric. XII: tongue is midline without atrophy or fasciculations.  Motor: Tone is normal. Bulk is normal. 5/5 strength was present in all four extremities.  Sensory: Sensation is symmetric to light touch and temperature in the arms and legs. Deep Tendon Reflexes: 2+ and symmetric in the biceps and patellae.  Cerebellar: FNF intact bilaterally    I have reviewed labs in epic and the results pertinent to  this consultation are: Bmp- unremarkable  I have reviewed the images obtained:CT head - no acute intracranial findigns  Impression: 49 yo F with a history of non-epileptic events. I am not convinced that she has any definite history of epilepsy, but is on keppra and therefore could continue this. I am not convinced the event tonight was a true seizure, but without witnessing it, it is difficult to be certain. A repeat EEG would be reasonable to look for signs of epileptic predisposition.   Recommendations: 1) EEG 2) continue keppra 500mg  BID for now. 3) If events continue, may need to consider referral for EMU.    Roland Rack, MD [facility] [phone]  If 7pm- 7am, please page neurology on call as listed in [place].

## 2014-12-10 NOTE — ED Notes (Signed)
Attempted to gain IV access x4, unsuccessful.

## 2014-12-10 NOTE — ED Notes (Signed)
Pt comes from [facility] where she began seizing since 9pm. Pt given 4mg  of Ativan IM and 5mg  of versed IM. Pt post-ictal upon arrival. Pt said by staff to have hit forehead on side table during seizure.

## 2014-12-10 NOTE — Progress Notes (Signed)
Patient was in the dayroom sitting in a chair.  When writer tried to arouse patient patient would not respond.  Writer went to gp get [employer] PA.  Patient appeared to be having mild seizure activity hands on .  As Probation officer was coming back to the dayroom to see patient patient slid down out of the chair and writer visualized patient hit her head one the table.  At this time patient was on the floor and writer had to assist patient to her side.  Patient seizure activity became more tonic-clonic like and all four of patient extremities were jerking along with her head. Blanket placed under patient head and staff attempted to keep patient safe.  EMS was called per Network engineer.   Ativan was administered times 2 see MAR.  Patient seizure like activity continued despite medication administration.  EMS arrived and took over.  Patient VS documented in doc flowsheets.  Patient was taken to [facility] at 2130

## 2014-12-10 NOTE — Progress Notes (Signed)
   12/10/14 2110  What Happened  Was fall witnessed? Yes  Who witnessed fall? [facility] RN, Lenice Llamas, Patriciaann Clan PA  Patients activity before fall other (comment) (in chair seizure like activity)  Point of contact head  Was patient injured? Unsure  Follow Up  MD notified Patriciaann Clan  Time MD notified 2105  Family notified Yes-comment  Time family notified 2139  Additional tests Yes-comment (patient sent to [facility] for further evaluation)

## 2014-12-10 NOTE — Significant Event (Cosign Needed)
Notified of patient with altered mentation while sitting in the dayroom with sitter at her side, which progressed to full tonic/clonic seizure activity at 2100 hours. 911 notified,  BLS ensued with patient receiving a total of 4 mg of Ativan commencing at 21:15 hours and 21:20. Accu check was 100 mg/dl, V/s, BP  102/87, sat 95 % on 2/[place]. First responders and EMS assisted with transporting the patient to the [facility] ED for further treatment.   [place] , MD on call

## 2014-12-10 NOTE — ED Notes (Signed)
Bed: AY30 Expected date:  Expected time:  Means of arrival:  Comments: EMS seizure from [facility]

## 2014-12-10 NOTE — ED Provider Notes (Signed)
CSN: [id number]     Arrival date & time 12/04/14  1618 History   First MD Initiated Contact with Patient 12/04/14 1711     Chief Complaint  Patient presents with  . Seizures     (Consider location/radiation/quality/duration/timing/severity/associated sxs/prior Treatment) HPI Comments: The patient is a 49 year old female, she has a history of seizures, history of pseudoseizures as well, she presents from behavioral health where she is currently an inpatient being treated for her suicidal ideations with a seizure. The seizure started approximately 45 minutes ago, it lasted for over 25 minutes before it stopped. She received 4 mg of Ativan IM followed by another 5 mg of Versed IM before the seizure abated. She has had no other significant complaints, according to staff she was in a common area doing a word surge puzzle when she started to have leg jerking which then transition in the whole body jerking. This was seen by a nurse with prior neurologic experience as well as by both paramedics report that this appeared to be real seizure-like activity. It was tonic-clonic, involved all 4 extremities.  Patient is a 49 y.o. female presenting with seizures. The history is provided by the patient, the EMS personnel and medical records.  Seizures   Past Medical History  Diagnosis Date  . Migraines   . Endometriosis   . ETOH abuse     sober 3 1/2 years  . Anorexia   . Depression   . Anxiety   . DJD (degenerative joint disease) of cervical spine   . Seizures   . PICC (peripherally inserted central catheter) in place     rt neck   Past Surgical History  Procedure Laterality Date  . Knee surgery    . Abdominal surgery    . Cholecystectomy    . Appendectomy    . Abdominal hysterectomy    . Nasal sinus surgery    . Laproscopy    . Carpal tunnel release      2010  . Esophagogastroduodenoscopy N/A 10/06/2014    Procedure: ESOPHAGOGASTRODUODENOSCOPY (EGD);  Surgeon: Inda Castle, MD;   Location: [employer] [facility];  Service: Endoscopy;  Laterality: N/A;   Family History  Problem Relation Age of Onset  . Hypertension Mother   . Hyperlipidemia Mother   . Heart failure Father   . Cancer Other    History  Substance Use Topics  . Smoking status: Current Every Day Smoker -- 0.25 packs/day for 20 years    Types: Cigarettes  . Smokeless tobacco: Never Used  . Alcohol Use: No   OB History    No data available     Review of Systems  Neurological: Positive for seizures.  All other systems reviewed and are negative.     Allergies  Aspirin; Doxycycline; Imitrex; Iodinated diagnostic agents; Lidocaine; Prednisone; Sulfa antibiotics; Sulfasalazine; Sumatriptan; Tramadol; Levofloxacin; Latex; Levofloxacin; Metrizamide; Nsaids; and Tolmetin  Home Medications   Prior to Admission medications   Medication Sig Start Date End Date Taking? Authorizing Provider  busPIRone (BUSPAR) 7.5 MG tablet Take 1 tablet (7.5 mg total) by mouth 2 (two) times daily. 10/19/14  Yes Knox Royalty, NP  Calcium Carbonate-Vitamin D (CALCIUM-VITAMIN D3 PO) Take 1 capsule by mouth daily.   Yes Historical Provider, MD  carisoprodol (SOMA) 350 MG tablet Take 1 tablet (350 mg total) by mouth at bedtime. Patient taking differently: Take 350 mg by mouth 2 (two) times daily.  10/19/14  Yes Knox Royalty, NP  clonazePAM (KLONOPIN) 1 MG tablet Take  1 tablet (1 mg total) by mouth 3 (three) times daily as needed (anxiety). 10/19/14  Yes Knox Royalty, NP  diphenhydrAMINE (BENADRYL) 25 MG tablet Take 25 mg by mouth every 6 (six) hours as needed for itching or allergies.   Yes Historical Provider, MD  FLUoxetine (PROZAC) 20 MG capsule Take 1 capsule (20 mg total) by mouth daily. 10/19/14  Yes Knox Royalty, NP  gabapentin (NEURONTIN) 100 MG capsule Take 1 capsule (100 mg total) by mouth 3 (three) times daily. 10/19/14  Yes Knox Royalty, NP  levETIRAcetam (KEPPRA) 500 MG tablet Take 1 tablet (500 mg total) by mouth 2 (two)  times daily. 10/19/14  Yes Knox Royalty, NP  Multiple Vitamin (MULTIVITAMIN WITH MINERALS) TABS tablet Take 1 tablet by mouth daily.   Yes Historical Provider, MD  pantoprazole (PROTONIX) 40 MG tablet Take 1 tablet (40 mg total) by mouth 2 (two) times daily. 10/19/14  Yes Knox Royalty, NP   BP 138/99 mmHg  Pulse 106  Temp(Src) 97.7 F (36.5 C) (Oral)  Resp 18  Ht 5' 1.5" (1.562 m)  Wt 129 lb (58.514 kg)  BMI 23.98 kg/m2  SpO2 96% Physical Exam  Constitutional: She appears well-developed and well-nourished. No distress.  HENT:  Head: Normocephalic and atraumatic.  Mouth/Throat: Oropharynx is clear and moist. No oropharyngeal exudate.  No evidence of tongue biting  Eyes: Conjunctivae and EOM are normal. Pupils are equal, round, and reactive to light. Right eye exhibits no discharge. Left eye exhibits no discharge. No scleral icterus.  Neck: Normal range of motion. Neck supple. No JVD present. No thyromegaly present.  Cardiovascular: Regular rhythm, normal heart sounds and intact distal pulses.  Exam reveals no gallop and no friction rub.   No murmur heard. Tachycardia present  Pulmonary/Chest: Effort normal and breath sounds normal. No respiratory distress. She has no wheezes. She has no rales.  Abdominal: Soft. Bowel sounds are normal. She exhibits no distension and no mass. There is no tenderness.  Musculoskeletal: Normal range of motion. She exhibits no edema or tenderness.  Lymphadenopathy:    She has no cervical adenopathy.  Neurological: She is alert. Coordination normal.  Skin: Skin is warm and dry. No rash noted. No erythema.  Psychiatric: She has a normal mood and affect. Her behavior is normal.  Nursing note and vitals reviewed.   ED Course  Procedures (including critical care time) Labs Review Labs Reviewed  GLUCOSE, CAPILLARY - Abnormal; Notable for the following:    Glucose-Capillary 126 (*)    All other components within normal limits  CBC WITH  DIFFERENTIAL/PLATELET - Abnormal; Notable for the following:    RBC 3.79 (*)    Neutrophils Relative % 41 (*)    Lymphocytes Relative 48 (*)    Lymphs Abs 4.7 (*)    All other components within normal limits  COMPREHENSIVE METABOLIC PANEL - Abnormal; Notable for the following:    AST 42 (*)    GFR calc non Af Amer 81 (*)    All other components within normal limits  BASIC METABOLIC PANEL - Abnormal; Notable for the following:    GFR calc non Af Amer 80 (*)    All other components within normal limits  GLUCOSE, CAPILLARY - Abnormal; Notable for the following:    Glucose-Capillary 100 (*)    All other components within normal limits  CK  CBC WITH DIFFERENTIAL/PLATELET  BASIC METABOLIC PANEL  POC URINE PREG, ED    Imaging Review No results found.  MDM   Final diagnoses:  Anxiety attack  Seizure    There is no seizure activity on arrival, she does appear sleepy and somewhat postictal, she does able to follow commands but appears diffusely weak. This is her third seizure over the last week while she has been at behavioral health, this one lasted over 25 minutes which is very unusual and concerning, will load with Keppra, consult with neurology, anticipate hospitalist admission.  D/w Dr. Leonel Ramsay - recommends admit - EEG, d/w Dr. Posey Pronto who will admit - labs and CT unremarkable.  Meds given in ED:  Keppra    Noemi Chapel, MD 12/10/14 [phone]

## 2014-12-10 NOTE — Progress Notes (Signed)
Close Observation Note: Patient resting quietly with eyes closed. Respirations even and unlabored. No distress noted . Close observation continues as ordered to maintain safety.

## 2014-12-10 NOTE — [facility] Group Notes (Signed)
[facility] LCSW Group Therapy  12/10/2014 3:01 PM  Type of Therapy:  Group Therapy  Participation Level:  Did Not Attend   Concha Pyo 12/10/2014, 3:01 PM

## 2014-12-10 NOTE — Progress Notes (Addendum)
Close Observation Note: Patient up this morning and complaint of generalized pain. She requested for Robaxin. Writer administered PRN medication as ordered for spasm/pain. Close observations continues to maintain safety.

## 2014-12-10 NOTE — Progress Notes (Signed)
CLOSE OBSERVATION NOTE: D: Patient resting in quiet room on 500 hall d/t drilling/construction on the unit causing her "PTSD" concerns. Pt is not doing well with all the noise on the unit. Pt continues to complain of tooth pain and is requesting pain medication. Pt has had no relief from Oragel PRN order. A: Close observation status maintained. MD, made aware of pt's concerns. Encouragement/Support provided to pt. Active listening by RN. 15 minute checks continued per protocol for patient safety.  R: Pt remains safe.

## 2014-12-10 NOTE — Progress Notes (Signed)
Pt attended spiritual care group on grief and loss facilitated by chaplain Jerene Pitch and counseling intern Hailey Makhi Muzquiz. Group opened with brief discussion and psycho-social ed around grief and loss in relationships and in relation to self - identifying life patterns, circumstances, changes that cause losses. Established group norm of speaking from own life experience. Group goal of establishing open and affirming space for members to share loss and experience with grief, normalize grief experience and provide psycho social education and grief support.  Group drew on narrative and Alderian therapeutic modalities.   [name] was present throughout group and presented with full affect. While another group member was describing his experience of loosing a parent unexpectedly, [name] offered support by sharing it is helpful to meet one-on-one with staff and also to use the resources at hospice. Shared briefly about the loss of her father.   [name] Counseling Intern

## 2014-12-10 NOTE — Plan of Care (Signed)
Problem: Ineffective individual coping Goal: STG: Patient will remain free from self harm Outcome: Progressing Patient remains free from self harm. 15 minute checks continued per protocol for patient safety. Pt remains on close observation today. RN with pt at all times.  Problem: Diagnosis: Increased Risk For Suicide Attempt Goal: LTG-Patient Will Report Improved Mood and Deny Suicidal LTG (by discharge) Patient will report improved mood and deny suicidal ideation.  Outcome: Not Progressing Pt denied having suicidal thoughts this morning but later stated "just shoot me" and endorsed increased depression. Pt reports concerns about PTSD during construction noise on unit. Pt allowed to rest in the quiet room. Pt's mood continues to be labile. Encouragement/Support provided to pt.  Goal: STG-Patient Will Attend All Groups On The Unit Outcome: Progressing Patient is attending some unit groups, but not all. Goal: STG-Patient Will Comply With Medication Regime Outcome: Progressing Patient has adhered to medication regimen today with ease.
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
## 2014-12-10 NOTE — Progress Notes (Signed)
CLOSE OBSERVATION NOTE: D: Patient observed in the cafeteria interacting with other pt's and eating dinner. Pt's mood is pleasant and pt is observed smiling. Pt reports she is eating soft foods d/t tooth pain. Pt reports her tooth pain has decreased with PRN medication administration. Pt denies having any concerns at this time. A: Pt remains on close observation status with RN present. 15 minute checks continued per protocol for patient safety.  R: Pt remains safe.

## 2014-12-10 NOTE — Progress Notes (Signed)
CLOSE OBSERVATION NOTE: D: Patient is observed in the day room interacting. Pt appears to be in no acute distress. Pt reports that she is "shakey" and believes with may be d/t her "neurontin" dose being increased. Pt expresses concerns about a "chipped tooth." A: Will follow up with MD in regards to pt's symptoms and concerns. RN sitting with pt at all times. 15 minute checks continued per protocol for patient safety.  R: Pt remains safe.

## 2014-12-10 NOTE — Progress Notes (Signed)
Patient emergency contact called at [phone] (sister Christin Fudge)

## 2014-12-10 NOTE — Tx Team (Addendum)
Interdisciplinary Treatment Plan Update   Date Reviewed:  12/10/2014  Time Reviewed:  8:32 AM  Progress in Treatment:   Attending groups:Patient is attending groups. Participating in groups: Patient engages in discussions. Taking medication as prescribed: Yes  Tolerating medication: Yes Family/Significant other contact made:  Yes,  SPE with sister.  Patient understands diagnosis: Yes, patient understands diagnosis and need for treatment. Discussing patient identified problems/goals with staff: Yes, patient is able to express goals for treatment and discharge. Medical problems stabilized or resolved: Yes Denies suicidal/homicidal ideation: No Patient has not harmed self or others: Yes  For review of initial/current patient goals, please see plan of care.  Estimated Length of Stay: 2-3 days  Reasons for Continued Hospitalization:  Anxiety Depression Medication stabilization  New Problems/Goals identified:    Discharge Plan or Barriers:    Patient is homeless. She will follow up with [facility]  Additional Comments:    Continue medication stabilization  Patient and CSW reviewed patient's identified goals and treatment plan.  Patient verbalized understanding and agreed to treatment plan.   Attendees:  Patient:  12/10/2014 8:32 AM   Signature:  Gabriel Earing, MD 12/10/2014 8:32 AM  Signature:  12/10/2014 8:32 AM  Signature:  Oswaldo Milian, RN 12/10/2014 8:32 AM  Signature:  Hester Mates, RN 12/10/2014 8:32 AM  Signature:  Cyril Mourning Drinkard, RN 12/10/2014 8:32 AM  Signature:  Joette Catching, LCSW 12/10/2014 8:32 AM  Signature: Heather Smart LCSW-A 12/10/2014 8:32 AM  Signature 12/10/2014 8:32 AM  Signature:   12/10/2014 8:32 AM  Signature: 12/10/2014  8:32 AM  Signature:   Lars Pinks, RN [facility] 12/10/2014  8:32 AM  Signature:   12/10/2014  8:32 AM    Scribe for Treatment Team:   Joette Catching,  12/10/2014 8:32 AM

## 2014-12-11 ENCOUNTER — Observation Stay (HOSPITAL_COMMUNITY)
Admission: EM | Admit: 2014-12-11 | Discharge: 2014-12-18 | Payer: Medicaid Other | Attending: Internal Medicine | Admitting: Internal Medicine

## 2014-12-11 ENCOUNTER — Inpatient Hospital Stay (HOSPITAL_COMMUNITY)
Admit: 2014-12-11 | Discharge: 2014-12-11 | Disposition: A | Discharge status: Home / Self Care | Payer: Medicaid Other | Attending: Internal Medicine | Admitting: Internal Medicine

## 2014-12-11 ENCOUNTER — Encounter (HOSPITAL_COMMUNITY): Payer: Self-pay | Admitting: Emergency Medicine

## 2014-12-11 DIAGNOSIS — R569 Unspecified convulsions: Secondary | ICD-10-CM

## 2014-12-11 DIAGNOSIS — T71162D Asphyxiation due to hanging, intentional self-harm, subsequent encounter: Secondary | ICD-10-CM | POA: Diagnosis not present

## 2014-12-11 DIAGNOSIS — F319 Bipolar disorder, unspecified: Secondary | ICD-10-CM | POA: Diagnosis not present

## 2014-12-11 DIAGNOSIS — F3130 Bipolar disorder, current episode depressed, mild or moderate severity, unspecified: Secondary | ICD-10-CM

## 2014-12-11 DIAGNOSIS — F3163 Bipolar disorder, current episode mixed, severe, without psychotic features: Secondary | ICD-10-CM | POA: Diagnosis present

## 2014-12-11 DIAGNOSIS — T71162A Asphyxiation due to hanging, intentional self-harm, initial encounter: Secondary | ICD-10-CM | POA: Insufficient documentation

## 2014-12-11 DIAGNOSIS — R45851 Suicidal ideations: Secondary | ICD-10-CM

## 2014-12-11 DIAGNOSIS — F419 Anxiety disorder, unspecified: Secondary | ICD-10-CM | POA: Diagnosis not present

## 2014-12-11 LAB — URINALYSIS W MICROSCOPIC (NOT AT ARMC)
Bilirubin Urine: NEGATIVE
GLUCOSE, UA: NEGATIVE mg/dL
HGB URINE DIPSTICK: NEGATIVE
Ketones, ur: NEGATIVE mg/dL
Nitrite: NEGATIVE
PROTEIN: NEGATIVE mg/dL
SPECIFIC GRAVITY, URINE: 1.01 (ref 1.005–1.030)
Urobilinogen, UA: 0.2 mg/dL (ref 0.0–1.0)
pH: 6.5 (ref 5.0–8.0)

## 2014-12-11 LAB — CBC WITH DIFFERENTIAL/PLATELET
BASOS PCT: 0 % (ref 0–1)
Basophils Absolute: 0 10*3/uL (ref 0.0–0.1)
EOS ABS: 0.1 10*3/uL (ref 0.0–0.7)
EOS PCT: 1 % (ref 0–5)
HEMATOCRIT: 35.2 % — AB (ref 36.0–46.0)
Hemoglobin: 11.7 g/dL — ABNORMAL LOW (ref 12.0–15.0)
LYMPHS PCT: 50 % — AB (ref 12–46)
Lymphs Abs: 3.7 10*3/uL (ref 0.7–4.0)
MCH: 32.4 pg (ref 26.0–34.0)
MCHC: 33.2 g/dL (ref 30.0–36.0)
MCV: 97.5 fL (ref 78.0–100.0)
Monocytes Absolute: 0.4 10*3/uL (ref 0.1–1.0)
Monocytes Relative: 5 % (ref 3–12)
NEUTROS ABS: 3.3 10*3/uL (ref 1.7–7.7)
Neutrophils Relative %: 44 % (ref 43–77)
PLATELETS: 422 10*3/uL — AB (ref 150–400)
RBC: 3.61 MIL/uL — AB (ref 3.87–5.11)
RDW: 13.3 % (ref 11.5–15.5)
WBC: 7.6 10*3/uL (ref 4.0–10.5)

## 2014-12-11 LAB — COMPREHENSIVE METABOLIC PANEL
ALT: 28 U/L (ref 0–35)
ANION GAP: 11 (ref 5–15)
AST: 32 U/L (ref 0–37)
Albumin: 4.2 g/dL (ref 3.5–5.2)
Alkaline Phosphatase: 71 U/L (ref 39–117)
BUN: 8 mg/dL (ref 6–23)
CO2: 27 mmol/L (ref 19–32)
CREATININE: 0.77 mg/dL (ref 0.50–1.10)
Calcium: 9.5 mg/dL (ref 8.4–10.5)
Chloride: 102 mmol/L (ref 96–112)
Glucose, Bld: 86 mg/dL (ref 70–99)
Potassium: 3.9 mmol/L (ref 3.5–5.1)
SODIUM: 140 mmol/L (ref 135–145)
Total Bilirubin: 0.8 mg/dL (ref 0.3–1.2)
Total Protein: 6.9 g/dL (ref 6.0–8.3)

## 2014-12-11 LAB — LACTIC ACID, PLASMA: LACTIC ACID, VENOUS: 1.5 mmol/L (ref 0.5–2.0)

## 2014-12-11 LAB — OSMOLALITY: Osmolality: 288 mOsm/kg (ref 275–300)

## 2014-12-11 LAB — OSMOLALITY, URINE: OSMOLALITY UR: 287 mosm/kg — AB (ref 390–1090)

## 2014-12-11 MED ORDER — OXYCODONE-ACETAMINOPHEN 5-325 MG PO TABS
1.0000 | ORAL_TABLET | Freq: Four times a day (QID) | ORAL | Status: DC | PRN
  Administered 2014-12-11 – 2014-12-16 (×14): 1 via ORAL
  Filled 2014-12-11 (×15): qty 1

## 2014-12-11 MED ORDER — BUTALBITAL-APAP-CAFFEINE 50-325-40 MG PO TABS
1.0000 | ORAL_TABLET | ORAL | Status: DC | PRN
  Administered 2014-12-11 – 2014-12-16 (×13): 1 via ORAL
  Filled 2014-12-11 (×14): qty 1

## 2014-12-11 MED ORDER — PANTOPRAZOLE SODIUM 40 MG PO TBEC
40.0000 mg | DELAYED_RELEASE_TABLET | Freq: Two times a day (BID) | ORAL | Status: DC
  Administered 2014-12-11 – 2014-12-18 (×16): 40 mg via ORAL
  Filled 2014-12-11 (×19): qty 1

## 2014-12-11 MED ORDER — LORAZEPAM 2 MG/ML IJ SOLN
1.0000 mg | INTRAMUSCULAR | Status: DC | PRN
  Administered 2014-12-13: 1 mg via INTRAVENOUS
  Filled 2014-12-11 (×3): qty 1

## 2014-12-11 MED ORDER — CLONAZEPAM 0.5 MG PO TABS
0.5000 mg | ORAL_TABLET | Freq: Three times a day (TID) | ORAL | Status: DC | PRN
  Administered 2014-12-11 – 2014-12-16 (×11): 0.5 mg via ORAL
  Filled 2014-12-11 (×11): qty 1

## 2014-12-11 MED ORDER — SODIUM CHLORIDE 0.9 % IV SOLN
INTRAVENOUS | Status: DC
  Administered 2014-12-11: 03:00:00 via INTRAVENOUS

## 2014-12-11 MED ORDER — GABAPENTIN 300 MG PO CAPS
300.0000 mg | ORAL_CAPSULE | Freq: Three times a day (TID) | ORAL | Status: DC
  Administered 2014-12-11 – 2014-12-18 (×22): 300 mg via ORAL
  Filled 2014-12-11 (×29): qty 1

## 2014-12-11 MED ORDER — ONDANSETRON HCL 4 MG PO TABS
4.0000 mg | ORAL_TABLET | Freq: Four times a day (QID) | ORAL | Status: DC | PRN
  Administered 2014-12-11 – 2014-12-15 (×3): 4 mg via ORAL
  Filled 2014-12-11 (×4): qty 1

## 2014-12-11 MED ORDER — FLUOXETINE HCL 20 MG PO CAPS
60.0000 mg | ORAL_CAPSULE | Freq: Every day | ORAL | Status: DC
  Administered 2014-12-11 – 2014-12-16 (×6): 60 mg via ORAL
  Filled 2014-12-11 (×7): qty 3

## 2014-12-11 MED ORDER — LEVETIRACETAM 500 MG PO TABS
500.0000 mg | ORAL_TABLET | Freq: Two times a day (BID) | ORAL | Status: DC
  Administered 2014-12-11 – 2014-12-18 (×16): 500 mg via ORAL
  Filled 2014-12-11 (×20): qty 1

## 2014-12-11 MED ORDER — SODIUM CHLORIDE 0.9 % IJ SOLN
3.0000 mL | Freq: Two times a day (BID) | INTRAMUSCULAR | Status: DC
  Administered 2014-12-11 – 2014-12-16 (×5): 3 mL via INTRAVENOUS

## 2014-12-11 MED ORDER — OXYCODONE-ACETAMINOPHEN 5-325 MG PO TABS
1.0000 | ORAL_TABLET | Freq: Three times a day (TID) | ORAL | Status: DC | PRN
  Administered 2014-12-11 (×2): 1 via ORAL
  Filled 2014-12-11 (×2): qty 1

## 2014-12-11 MED ORDER — LISINOPRIL 20 MG PO TABS
20.0000 mg | ORAL_TABLET | Freq: Every day | ORAL | Status: DC
  Administered 2014-12-11 – 2014-12-18 (×8): 20 mg via ORAL
  Filled 2014-12-11 (×9): qty 1

## 2014-12-11 MED ORDER — METHOCARBAMOL 500 MG PO TABS
750.0000 mg | ORAL_TABLET | Freq: Three times a day (TID) | ORAL | Status: DC | PRN
  Administered 2014-12-11 – 2014-12-14 (×7): 750 mg via ORAL
  Filled 2014-12-11 (×8): qty 2

## 2014-12-11 MED ORDER — ISOMETHEPTENE-APAP-DICHLORAL 65-325-100 MG PO CAPS: 1.0000 | ORAL_CAPSULE | Freq: Four times a day (QID) | ORAL | Status: DC | PRN

## 2014-12-11 MED ORDER — LORAZEPAM 2 MG/ML IJ SOLN
1.5000 mg | Freq: Once | INTRAMUSCULAR | Status: AC
  Administered 2014-12-11: 1.5 mg via INTRAVENOUS

## 2014-12-11 MED ORDER — HEPARIN SODIUM (PORCINE) 5000 UNIT/ML IJ SOLN
5000.0000 [IU] | Freq: Three times a day (TID) | INTRAMUSCULAR | Status: DC
  Filled 2014-12-11 (×29): qty 1

## 2014-12-11 MED ORDER — ONDANSETRON HCL 4 MG/2ML IJ SOLN: 4.0000 mg | Freq: Four times a day (QID) | INTRAMUSCULAR | Status: DC | PRN

## 2014-12-11 MED ORDER — LORAZEPAM 2 MG/ML IJ SOLN
0.5000 mg | INTRAMUSCULAR | Status: DC | PRN
  Administered 2014-12-12 – 2014-12-13 (×6): 0.5 mg via INTRAVENOUS
  Filled 2014-12-11 (×4): qty 1

## 2014-12-11 MED ORDER — HYDROXYZINE HCL 25 MG PO TABS
25.0000 mg | ORAL_TABLET | Freq: Four times a day (QID) | ORAL | Status: DC | PRN
  Administered 2014-12-11: 25 mg via ORAL
  Filled 2014-12-11: qty 1

## 2014-12-11 NOTE — Consult Note (Signed)
[place] [facility] Consult   Reason for Consult:  Bipolar depression and suicide ideation Referring Physician:  Dr. Cruzita Lederer Patient Identification: [name] MRN:  [id number] Principal Diagnosis: Seizure-like activity Diagnosis:   Patient Active Problem List   Diagnosis Date Noted  . Seizure-like activity [R56.9] 12/11/2014  . MDD (major depressive disorder), recurrent severe, without psychosis [F33.2] 11/29/2014  . Panic disorder [F41.0] 11/29/2014  . Agoraphobia [F40.00] 11/29/2014  . ARF (acute renal failure) [N17.9] 11/25/2014  . Intentional acetaminophen overdose [T39.1X2A] 10/26/2014  . Major depressive disorder, recurrent, severe without psychotic features [F33.2]   . Insomnia [G47.00]   . Chronic pain syndrome [G89.4]   . Bipolar 1 disorder, mixed, severe [F31.63] 10/09/2014  . Acute blood loss anemia [D62] 10/07/2014  . Liver failure, acute [K72.00]   . GI bleed [K92.2] 10/06/2014  . Duodenal ulcer hemorrhage [K26.4] 10/06/2014  . Vomiting [R11.10] 10/05/2014  . Acute hepatitis [K72.00] 10/05/2014  . Acute renal insufficiency [N28.9] 10/05/2014  . Seizure disorder [G40.909] 10/05/2014  . Hyperammonemia [E72.20] 10/05/2014  . Suicidal ideation [R45.851] 10/05/2014  . Bipolar disorder [F31.9] 10/01/2014  . Major depression [F32.2] 10/01/2014  . Nausea and vomiting [R11.2] 09/30/2014  . Diarrhea [R19.7] 09/30/2014  . SIRS (systemic inflammatory response syndrome) [A41.9] 07/15/2014  . Tylenol overdose [T39.1X4A] 07/13/2014  . Hypokalemia [E87.6] 07/13/2014    Total Time spent with patient: 45 minutes  Subjective:   [name] is a 49 y.o. female patient admitted with bipolar depression and anxiety.  HPI:  [name] is a 49 y.o. female seen, chart reviewed for psychiatric consultation and evaluation of depression, anxiety, bipolar disorder, suicidal ideation and behaviors. Patient is known to this provider from her previous 2 hospitalization at [facility] for Tylenol overdose as a suicidal attempt. Patient was admitted to [facility] after recent Tylenol overdose and involved in the inpatient treatment over 13 days. Reportedly patient was not able to show clinical progress of depression and suicidal ideations. Reportedly Wednesday a week ago she tried to kill herself by strangulating with a bedsheet which caused hypoxia and reportedly induced seizure. Patient was evaluated at [facility] long emergency department at that time and then sent her back with a diagnosis of pseudoseizures. Patient was presented [place] long emergency department 12/10/2014 with another episode of tonic-clonic seizures and then admitted to medical floor for further investigation and treatment. Patient continued to endorse symptoms of depression, anxiety, suicidal thoughts and has intention and plans.   Patient has increased anxiety regarding having the frequent seizures/pseudoseizures. Patient was seen by neurology and will be receiving electroencephalography today. Reportedly patient has a history of seizures and was treated with Keppra sometime ago. Patient has multiple acute psychiatric hospitalization both at behavioral health Hospital, [facility] [place] [facility] for Tylenol overdose as a suicidal attempt. 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 Patient sister who was supportive to her in the past 6 she cannot care for her any longer because of too many episodes of trying to kill herself. Spoke with the psychiatric inpatient team including Dr. Parke Poisson, nurse practitioner and psychiatric social service who reported patient failed to respond positively an acute psychiatric hospital and believes she may benefit from long-term psychiatric hospitalization at [facility]. Case  discussed with Darrick Meigs, LCSW and will be referred to [facility]. Will request to keep her in their priority  list.   Medical history: Patient  with Past medical history of GERD, anxiety, depression, bipolar disorder. The patient is presenting with seizure-like episode. Patient has prior history of seizure and she was treated with Keppra and the past. Patient recently presented with suicidal ideation and Tylenol overdose on 11/25/2014. Patient will discharge to behavioral health. In the [place] patient had a seizure-like activity on 318 at which time she was seen in the ER and it was felt like pseudoseizure and therefore she was sent back to behavioral health. She had another episode on 12/08/2014 at which time again she was transferred to behavioral health as she was able to beredirected during the seizure.  Today she had an event at which time she was holding her hands and head shaking and unresponsiveness of the whole body. she was given lorazepam twice control the seizure there was no tongue bite she did not have any urine or bowel incontinence. There was no fall. There has been gradually increasing her dose of gabapentin. She has not been receiving most part are [place] currently in the hospital. She has also been receiving Robaxin with increasing dose as well as Percocet for her to take. No other changes reported.  The patient is coming from [facility]. And at her baseline independent for most of her ADL.  Review of Systems: as mentioned in the history of present illness.  A Comprehensive review of the other systems is negative.  HPI Elements:  Location:  depression and bipolar disorder. Quality:  poor. Severity:  suicide behaviors. Timing:  chronic abdominal pain. Duration:  few weeks. Context:  psychosocial stresses.  Past Medical History:  Past Medical History  Diagnosis Date  . Migraines   . Endometriosis   . ETOH abuse     sober 3 1/2 years  . Anorexia   . Depression   .  Anxiety   . DJD (degenerative joint disease) of cervical spine   . Seizures   . PICC (peripherally inserted central catheter) in place     rt neck    Past Surgical History  Procedure Laterality Date  . Knee surgery    . Abdominal surgery    . Cholecystectomy    . Appendectomy    . Abdominal hysterectomy    . Nasal sinus surgery    . Laproscopy    . Carpal tunnel release      2010  . Esophagogastroduodenoscopy N/A 10/06/2014    Procedure: ESOPHAGOGASTRODUODENOSCOPY (EGD);  Surgeon: Inda Castle, MD;  Location: [place];  Service: Endoscopy;  Laterality: N/A;   Family History:  Family History  Problem Relation Age of Onset  . Hypertension Mother   . Hyperlipidemia Mother   . Heart failure Father   . Cancer Other    Social History:  History  Alcohol Use No     History  Drug Use No    History   Social History  . Marital Status: Single    Spouse Name: N/A  . Number of Children: N/A  . Years of Education: N/A   Social History Main Topics  . Smoking status: Current Every Day Smoker -- 0.25 packs/day for 20 years    Types: Cigarettes  . Smokeless tobacco: Never Used  . Alcohol Use: No  . Drug Use: No  . Sexual Activity: Not on file   Other Topics Concern  . None   Social History Narrative   Additional Social History:  Allergies:   Allergies  Allergen Reactions  . Aspirin Anaphylaxis  . Doxycycline Anaphylaxis and Other (See Comments)    Joint damage also  . Imitrex [Sumatriptan Base] Other (See Comments)    Severe hypertension  . Iodinated Diagnostic Agents Anaphylaxis  . Lidocaine Anaphylaxis    Pt states she does well with Marcaine/Bupivicaine without problem  . Prednisone Other (See Comments)    She goes crazy  . Sulfa Antibiotics Anaphylaxis  . Sulfasalazine Anaphylaxis  . Sumatriptan Other (See Comments)    Felt like she was "having a stroke", heart rate and blood pressure "went through the roof"  .  Tramadol Other (See Comments)    seizures  . Levofloxacin Other (See Comments)    Joints hurt  . Latex Rash  . Levofloxacin Rash and Other (See Comments)    Joints hurt  . Metrizamide Other (See Comments)  . Nsaids Rash and Other (See Comments)    GI bleed  . Tolmetin Rash    Labs:  Results for orders placed or performed during the hospital encounter of 12/11/14 (from the past 48 hour(s))  Osmolality, urine     Status: Abnormal   Collection Time: 12/11/14  2:10 AM  Result Value Ref Range   Osmolality, Ur 287 (L) 390 - 1090 mOsm/kg    Comment: Performed at [facility]  Urinalysis with microscopic     Status: Abnormal   Collection Time: 12/11/14  2:10 AM  Result Value Ref Range   Color, Urine YELLOW YELLOW   APPearance CLEAR CLEAR   Specific Gravity, Urine 1.010 1.005 - 1.030   pH 6.5 5.0 - 8.0   Glucose, UA NEGATIVE NEGATIVE mg/dL   Hgb urine dipstick NEGATIVE NEGATIVE   Bilirubin Urine NEGATIVE NEGATIVE   Ketones, ur NEGATIVE NEGATIVE mg/dL   Protein, ur NEGATIVE NEGATIVE mg/dL   Urobilinogen, UA 0.2 0.0 - 1.0 mg/dL   Nitrite NEGATIVE NEGATIVE   Leukocytes, UA SMALL (A) NEGATIVE   WBC, UA 3-6 <3 WBC/hpf   Bacteria, UA RARE RARE   Squamous Epithelial / LPF FEW (A) RARE  CBC with Differential/Platelet     Status: Abnormal   Collection Time: 12/11/14  2:30 AM  Result Value Ref Range   WBC 7.6 4.0 - 10.5 K/uL   RBC 3.61 (L) 3.87 - 5.11 MIL/uL   Hemoglobin 11.7 (L) 12.0 - 15.0 g/dL   HCT 35.2 (L) 36.0 - 46.0 %   MCV 97.5 78.0 - 100.0 fL   MCH 32.4 26.0 - 34.0 pg   MCHC 33.2 30.0 - 36.0 g/dL   RDW 13.3 11.5 - 15.5 %   Platelets 422 (H) 150 - 400 K/uL   Neutrophils Relative % 44 43 - 77 %   Neutro Abs 3.3 1.7 - 7.7 K/uL   Lymphocytes Relative 50 (H) 12 - 46 %   Lymphs Abs 3.7 0.7 - 4.0 K/uL   Monocytes Relative 5 3 - 12 %   Monocytes Absolute 0.4 0.1 - 1.0 K/uL   Eosinophils Relative 1 0 - 5 %   Eosinophils Absolute 0.1 0.0 - 0.7 K/uL   Basophils Relative 0  0 - 1 %   Basophils Absolute 0.0 0.0 - 0.1 K/uL  Comprehensive metabolic panel     Status: None   Collection Time: 12/11/14  2:30 AM  Result Value Ref Range   Sodium 140 135 - 145 mmol/L   Potassium 3.9 3.5 - 5.1 mmol/L   Chloride 102 96 - 112 mmol/L   CO2  27 19 - 32 mmol/L   Glucose, Bld 86 70 - 99 mg/dL   BUN 8 6 - 23 mg/dL   Creatinine, Ser 0.77 0.50 - 1.10 mg/dL   Calcium 9.5 8.4 - 10.5 mg/dL   Total Protein 6.9 6.0 - 8.3 g/dL   Albumin 4.2 3.5 - 5.2 g/dL   AST 32 0 - 37 U/L   ALT 28 0 - 35 U/L   Alkaline Phosphatase 71 39 - 117 U/L   Total Bilirubin 0.8 0.3 - 1.2 mg/dL   GFR calc non Af Amer >90 >90 mL/min   GFR calc Af Amer >90 >90 mL/min    Comment: (NOTE) The eGFR has been calculated using the CKD EPI equation. This calculation has not been validated in all clinical situations. eGFR's persistently <90 mL/min signify possible Chronic Kidney Disease.    Anion gap 11 5 - 15  Lactic acid, plasma     Status: None   Collection Time: 12/11/14  2:30 AM  Result Value Ref Range   Lactic Acid, Venous 1.5 0.5 - 2.0 mmol/L  Osmolality     Status: None   Collection Time: 12/11/14  2:30 AM  Result Value Ref Range   Osmolality 288 275 - 300 mOsm/kg    Comment: Performed at [place]: Blood pressure 120/72, pulse 69, temperature 97.9 F (36.6 C), temperature source Oral, resp. rate 18, height '5\' 1"'  (1.549 m), weight 58.06 kg (128 lb), SpO2 95 %.  Risk to Self: Is patient at risk for suicide?: No Risk to Others:   Prior Inpatient Therapy:   Prior Outpatient Therapy:    Current Facility-Administered Medications  Medication Dose Route Frequency Provider Last Rate Last Dose  . 0.9 %  sodium chloride infusion   Intravenous Continuous Lavina Hamman, MD 75 mL/hr at 12/11/14 0234    . butalbital-acetaminophen-caffeine (FIORICET, ESGIC) 50-325-40 MG per tablet 1 tablet  1 tablet Oral Q4H PRN Lavina Hamman, MD   1 tablet at 12/11/14 [phone]  . clonazePAM (KLONOPIN)  tablet 0.5 mg  0.5 mg Oral TID PRN Lavina Hamman, MD   0.5 mg at 12/11/14 0235  . FLUoxetine (PROZAC) capsule 60 mg  60 mg Oral Daily Lavina Hamman, MD      . gabapentin (NEURONTIN) capsule 300 mg  300 mg Oral TID Lavina Hamman, MD      . heparin injection 5,000 Units  5,000 Units Subcutaneous 3 times per day Lavina Hamman, MD   5,000 Units at 12/11/14 0235  . hydrOXYzine (ATARAX/VISTARIL) tablet 25 mg  25 mg Oral Q6H PRN Lavina Hamman, MD      . levETIRAcetam (KEPPRA) tablet 500 mg  500 mg Oral BID Lavina Hamman, MD   500 mg at 12/11/14 0252  . lisinopril (PRINIVIL,ZESTRIL) tablet 20 mg  20 mg Oral Daily Lavina Hamman, MD      . LORazepam (ATIVAN) injection 1 mg  1 mg Intravenous Q4H PRN Lavina Hamman, MD      . methocarbamol (ROBAXIN) tablet 750 mg  750 mg Oral Q8H PRN Lavina Hamman, MD   750 mg at 12/11/14 0750  . ondansetron (ZOFRAN) tablet 4 mg  4 mg Oral Q6H PRN Lavina Hamman, MD   4 mg at 12/11/14 0235   Or  . ondansetron (ZOFRAN) injection 4 mg  4 mg Intravenous Q6H PRN Lavina Hamman, MD      . oxyCODONE-acetaminophen (PERCOCET/ROXICET) 5-325 MG  per tablet 1 tablet  1 tablet Oral Q8H PRN Lavina Hamman, MD   1 tablet at 12/11/14 1012  . pantoprazole (PROTONIX) EC tablet 40 mg  40 mg Oral BID Lavina Hamman, MD   40 mg at 12/11/14 1012  . sodium chloride 0.9 % injection 3 mL  3 mL Intravenous Q12H Lavina Hamman, MD   3 mL at 12/11/14 0237    Musculoskeletal: Strength & Muscle Tone: within normal limits Gait & Station: normal Patient leans: N/A  Psychiatric Specialty Exam: Physical Exam as per the history and physical  ROS depression and anxiety and recurrent episodes of seizure  Blood pressure 120/72, pulse 69, temperature 97.9 F (36.6 C), temperature source Oral, resp. rate 18, height '5\' 1"'  (1.549 m), weight 58.06 kg (128 lb), SpO2 95 %.Body mass index is 24.2 kg/(m^2).  General Appearance: Guarded  Eye Contact::  Good  Speech:  Clear and Coherent and Slow  Volume:   Decreased  Mood:  Anxious, Depressed and Worthless  Affect:  Congruent, Depressed and Tearful  Thought Process:  Coherent and Goal Directed  Orientation:  Full (Time, Place, and Person)  Thought Content:  Rumination  Suicidal Thoughts:  Yes.  with intent/plan  Homicidal Thoughts:  No  Memory:  Immediate;   Fair Recent;   Fair  Judgement:  Impaired  Insight:  Fair  Psychomotor Activity:  Decreased  Concentration:  Fair  Recall:  Good  Fund of Knowledge:Good  Language: Good  Akathisia:  Negative  Handed:  Right  AIMS (if indicated):     Assets:  Communication Skills Desire for Improvement Housing Leisure Time Resilience Talents/Skills  ADL's:  Intact  Cognition: WNL  Sleep:      Medical Decision Making: New problem, with additional work up planned, Review of Psycho-Social Stressors (1), Review or order clinical lab tests (1), Established Problem, Worsening (2), Review of Last Therapy Session (1), Review or order medicine tests (1), Review of Medication Regimen & Side Effects (2) and Review of New Medication or Change in Dosage (2)  Treatment Plan Summary: Daily contact with patient to assess and evaluate symptoms and progress in treatment and Medication management  Plan:  Continue safety sitter, patient continued to be suicidal and recently tried to choke herself with bed sheet while in acute psychiatric hospital Continue current medication management without changes Will follow-up with the neurology recommendations and further medical investigation Recommend psychiatric Inpatient admission when medically cleared. Supportive therapy provided about ongoing stressors. Appreciate psychiatric consultation and follow up as clinically required Please contact [phone] or [phone] if needs further assistance  Disposition:  [facility] and failed acute inpatient psychiatric hospitalization after 14 days  participation. Case discussed with the Dr.Gherghe and Trilby Leaver  [facility] R. 12/11/2014 10:19 AM

## 2014-12-11 NOTE — Progress Notes (Signed)
PROGRESS NOTE  [name] ZDG:[id number] DOB: Mar 26, 1966 DOA: 12/11/2014 PCP: No PCP Per Patient  HPI: [name] is a 49 y.o. female with Past medical history of GERD, anxiety, depression, bipolar disorder. The patient is presenting with seizure-like episode. Patient has prior history of seizure and she was treated with Keppra and the past. Patient recently presented with suicidal ideation and Tylenol overdose on 11/25/2014. Patient will discharge to behavioral health. In the [place] patient had a seizure-like activity on 318 at which time she was seen in the ER and it was felt like pseudoseizure and therefore she was sent back to behavioral health. She had another episode on 12/08/2014 at which time again she was transferred to behavioral health as she was able to beredirected during the seizure. Today she had an event at which time she was holding her hands and head shaking and unresponsiveness of the whole body. she was given lorazepam twice control the seizure there was no tongue bite she did not have any urine or bowel incontinence. There was no fall.  Subjective / 24 H Interval events - patient without complaints initially - later paged by RN as patient with seizure-like activity, on my evaluation patient with arms contracted, upper body shaking back and forth, occasional hyperextension, while getting her EEG  Assessment/Plan: Principal Problem:   Seizure-like activity Active Problems:   Suicidal ideation   Bipolar 1 disorder, mixed, severe   Suicide attempt by hanging  Seizure-like activity  - recurrent episode today while undergoing her EEG - neuro consulted, appreciate input, non epileptic event pet EEG - no bowel/bladder incontinence - resolved with 0.5 mg Ativan, no post ictal features as patient oriented to time/place/person within few minutes of receiving Ativan - continue Keppra  Depression/anxiety/bipolar disorder - multiple suicide attempts with Tylenol  being hospitalized here and at [facility] - this admission was from [facility] - psychiatry consulted and recommended long term inpatient psychiatry at [facility] - patient continued to be suicidal and tried to choke herself with bed sheets while in acute psychiatric hospital  - continue her current regimen  GERD - continue PPI  HTN - continue Lisinopril   Diet: Diet regular Fluids: none DVT Prophylaxis: heparin  Code Status: Full Code Family Communication: none bedside  Disposition Plan: inpatient psych when bed available, medically ready likely 3/24  Consultants:  Psychiatry   Neurology   Procedures:  EEG 3/23    Antibiotics  Anti-infectives    None       Studies  Ct Head Wo Contrast  12/10/2014   CLINICAL DATA:  Multiple seizures today.  EXAM: CT HEAD WITHOUT CONTRAST  TECHNIQUE: Contiguous axial images were obtained from the base of the skull through the vertex without intravenous contrast.  COMPARISON:  Head CT 11/25/14  FINDINGS: No intracranial hemorrhage, mass effect, or midline shift. No hydrocephalus. The basilar cisterns are patent. No evidence of territorial infarct. No intracranial fluid collection. Previous left frontal scalp contusion has resolved. Calvarium is intact. Included paranasal sinuses and mastoid air cells are well aerated.  IMPRESSION: No acute intracranial abnormality.   Electronically Signed   By: Jeb Levering M.D.   On: 12/10/2014 23:59    Objective  Filed Vitals:   12/11/14 0150 12/11/14 0537  BP: 150/81 120/72  Pulse: 83 69  Temp: 97.7 F (36.5 C) 97.9 F (36.6 C)  TempSrc: Oral Oral  Resp: 18 18  Height: 5\' 1"  (1.549 m)   Weight: 58.06 kg (128 lb)   SpO2: 100%  95%    Intake/Output Summary (Last 24 hours) at 12/11/14 1334 Last data filed at 12/11/14 1331  Gross per 24 hour  Intake    720 ml  Output      0 ml  Net    720 ml   Filed Weights   12/11/14 0150  Weight: 58.06 kg (128 lb)    Exam:  General:  NAD  HEENT:  no scleral icterus, PERRL  Cardiovascular: RRR without MRG, 2+ peripheral pulses, no edema  Respiratory: CTA biL, good air movement, no wheezing, no crackles, no rales  Abdomen: soft, non tender, BS +, no guarding  MSK/Extremities: no clubbing/cyanosis, no joint swelling  Skin: no rashes  Neuro: non focal, strength 5/5 in all 4   Data Reviewed: Basic Metabolic Panel:  Recent Labs Lab 12/06/14 2011 12/08/14 1957 12/11/14 0230  NA 140 138 140  K 4.7 4.9 3.9  CL 105 102 102  CO2 22 25 27   GLUCOSE 95 87 86  BUN 7 9 8   CREATININE 0.84 0.85 0.77  CALCIUM 9.6 9.6 9.5   Liver Function Tests:  Recent Labs Lab 12/06/14 2011 12/11/14 0230  AST 42* 32  ALT 31 28  ALKPHOS 92 71  BILITOT 0.9 0.8  PROT 7.5 6.9  ALBUMIN 4.5 4.2   CBC:  Recent Labs Lab 12/06/14 2011 12/11/14 0230  WBC 9.8 7.6  NEUTROABS 4.0 3.3  HGB 12.4 11.7*  HCT 37.2 35.2*  MCV 98.2 97.5  PLT PLATELET CLUMPS NOTED ON SMEAR, COUNT APPEARS INCREASED 422*   Cardiac Enzymes:  Recent Labs Lab 12/06/14 2011  CKTOTAL 93   CBG:  Recent Labs Lab 12/06/14 1813 12/10/14 2113  GLUCAP 126* 100*   Scheduled Meds: . FLUoxetine  60 mg Oral Daily  . gabapentin  300 mg Oral TID  . heparin  5,000 Units Subcutaneous 3 times per day  . levETIRAcetam  500 mg Oral BID  . lisinopril  20 mg Oral Daily  . pantoprazole  40 mg Oral BID  . sodium chloride  3 mL Intravenous Q12H   Continuous Infusions: . sodium chloride 75 mL/hr at 12/11/14 0234    Marzetta Board, MD [facility] Pager [phone]. If 7 PM - 7 AM, please contact night-coverage at [email or web address], password [facility] 12/11/2014, 1:34 PM  LOS: 0 days

## 2014-12-11 NOTE — Procedures (Signed)
EEG report.  Brief clinical history:  49 y.o. female with anxiety, depression, bipolar disorder, presenting with seizure-like episode. Patient has prior history of seizure treated with Keppra. Patient recently presented with suicidal ideation and Tylenol overdose on 11/25/2014. Patient will discharge to behavioral health. In the [place] patient had a seizure-like activity on 318 at which time she was seen in the ER and it was felt like pseudoseizure and therefore she was sent back to behavioral health. She had another episode on 12/08/2014 at which time again she was transferred to behavioral health as she was able to be  redirected during the seizure. Today she had an event at which time she was holding her hands and head shaking and unresponsiveness of the whole body. she was given lorazepam twice control the seizure there was no tongue bite she did not have any urine or bowel incontinence  Technique: this is a 17 channel routine scalp EEG performed at the bedside with bipolar and monopolar montages arranged in accordance to the international 10/20 system of electrode placement. One channel was dedicated to EKG recording.  No sleep or drowsiness was achieved. Hyperventilation and intermittent photic stimulation were utilized as activating procedures.  Description: In the wakeful state, the best background consisted of a medium amplitude, posterior dominant, well sustained, symmetric and reactive 9 Hz rhythm. Hyperventilation induced mild, diffuse, physiologic slowing but no epileptiform discharges. Intermittent photic stimulation did induce a normal driving response.  No focal or generalized epileptiform discharges noted.  No pathologic areas of slowing seen.  Patient sustained a prolonged paroxysmal event (no video available with this recording) described by EEG tech as " patient getting nervous, violent jerking movement with huge back arch, able to open eyes upon commands but for the  most part with eyes close during the event". Although the recording is obscured by muscle artifact, there is not frank background changes associated with the event". EKG showed sinus rhythm.  Impression: this is a normal awake routine EEG in which patient sustained a paroxysmal event described above (no video available) that was not associated with concomitant electrographic changes. Although there was not video at the time of the reported event, the semiology of the spell and the lack of EEG background abnormality are indicative of a non epileptic seizure.  Clinical correlation is advised.   Dorian Pod, MD [facility]

## 2014-12-11 NOTE — H&P (Signed)
[facility] History and Physical  Patient: [name]  MRN: [id number]  DOB: 03-08-1966  DOS: the patient was seen and examined on 12/11/2014  PCP: No PCP Per Patient  Chief Complaint: seizure like episode  HPI: [name] is a 49 y.o. female with Past medical history of GERD, anxiety, depression, bipolar disorder. The patient is presenting with seizure-like episode. Patient has prior history of seizure and she was treated with Keppra and the past. Patient recently presented with suicidal ideation and Tylenol overdose on 11/25/2014. Patient will discharge to behavioral health. In the [place] patient had a seizure-like activity on 318 at which time she was seen in the ER and it was felt like pseudoseizure and therefore she was sent back to behavioral health. She had another episode on 12/08/2014 at which time again she was transferred to behavioral health as she was able to be   redirected during the seizure. Today she had an event at which time she was holding her hands and head shaking and unresponsiveness of the whole body.  she was given lorazepam twice control the seizure there was no tongue bite she did not have any urine or bowel incontinence. There was no fall. There has been gradually increasing her dose of gabapentin. She has not been receiving most part are Soma currently in the hospital. She has also been receiving Robaxin with increasing dose as well as Percocet for her to take. No other changes reported.  The patient is coming from [facility]. And at her baseline independent for most of her ADL.  Review of Systems: as mentioned in the history of present illness.  A Comprehensive review of the other systems is negative.  Past Medical History  Diagnosis Date  . Migraines   . Endometriosis   . ETOH abuse     sober 3 1/2 years  . Anorexia   . Depression   . Anxiety   . DJD (degenerative joint disease) of cervical spine   . Seizures   . PICC (peripherally  inserted central catheter) in place     rt neck   Past Surgical History  Procedure Laterality Date  . Knee surgery    . Abdominal surgery    . Cholecystectomy    . Appendectomy    . Abdominal hysterectomy    . Nasal sinus surgery    . Laproscopy    . Carpal tunnel release      2010  . Esophagogastroduodenoscopy N/A 10/06/2014    Procedure: ESOPHAGOGASTRODUODENOSCOPY (EGD);  Surgeon: Inda Castle, MD;  Location: [facility];  Service: Endoscopy;  Laterality: N/A;   Social History:  reports that she has been smoking Cigarettes.  She has a 5 pack-year smoking history. She has never used smokeless tobacco. She reports that she does not drink alcohol or use illicit drugs.  Allergies  Allergen Reactions  . Aspirin Anaphylaxis  . Doxycycline Anaphylaxis and Other (See Comments)    Joint damage also  . Imitrex [Sumatriptan Base] Other (See Comments)    Severe hypertension  . Iodinated Diagnostic Agents Anaphylaxis  . Lidocaine Anaphylaxis    Pt states she does well with Marcaine/Bupivicaine without problem  . Prednisone Other (See Comments)    She goes crazy  . Sulfa Antibiotics Anaphylaxis  . Sulfasalazine Anaphylaxis  . Sumatriptan Other (See Comments)    Felt like she was "having a stroke", heart rate and blood pressure "went through the roof"  . Tramadol Other (See Comments)    seizures  . Levofloxacin  Other (See Comments)    Joints hurt  . Latex Rash  . Levofloxacin Rash and Other (See Comments)    Joints hurt  . Metrizamide Other (See Comments)  . Nsaids Rash and Other (See Comments)    GI bleed  . Tolmetin Rash    Family History  Problem Relation Age of Onset  . Hypertension Mother   . Hyperlipidemia Mother   . Heart failure Father   . Cancer Other     Prior to Admission medications   Medication Sig Start Date End Date Taking? Authorizing Provider  busPIRone (BUSPAR) 7.5 MG tablet Take 1 tablet (7.5 mg total) by mouth 2 (two) times daily. 10/19/14  Yes Knox Royalty, NP  Calcium Carbonate-Vitamin D (CALCIUM-VITAMIN D3 PO) Take 1 capsule by mouth daily.   Yes Historical Provider, MD  carisoprodol (SOMA) 350 MG tablet Take 1 tablet (350 mg total) by mouth at bedtime. Patient taking differently: Take 350 mg by mouth 2 (two) times daily.  10/19/14  Yes Knox Royalty, NP  clonazePAM (KLONOPIN) 1 MG tablet Take 1 tablet (1 mg total) by mouth 3 (three) times daily as needed (anxiety). 10/19/14  Yes Knox Royalty, NP  diphenhydrAMINE (BENADRYL) 25 MG tablet Take 25 mg by mouth every 6 (six) hours as needed for itching or allergies.   Yes Historical Provider, MD  FLUoxetine (PROZAC) 20 MG capsule Take 1 capsule (20 mg total) by mouth daily. 10/19/14  Yes Knox Royalty, NP  gabapentin (NEURONTIN) 100 MG capsule Take 1 capsule (100 mg total) by mouth 3 (three) times daily. Patient taking differently: Take 300 mg by mouth 3 (three) times daily.  10/19/14  Yes Knox Royalty, NP  hydrOXYzine (ATARAX/VISTARIL) 25 MG tablet Take 25 mg by mouth every 6 (six) hours as needed for itching.   Yes Historical Provider, MD  isometheptene-acetaminophen-dichloralphenazone (MIDRIN) 65-325-100 MG capsule Take 1 capsule by mouth every 6 (six) hours as needed for migraine. Maximum 5 capsules in 12 hours for migraine headaches, 8 capsules in 24 hours for tension headaches.   Yes Historical Provider, MD  levETIRAcetam (KEPPRA) 500 MG tablet Take 1 tablet (500 mg total) by mouth 2 (two) times daily. 10/19/14  Yes Knox Royalty, NP  lisinopril (PRINIVIL,ZESTRIL) 20 MG tablet Take 20 mg by mouth daily.   Yes Historical Provider, MD  methocarbamol (ROBAXIN) 750 MG tablet Take 750 mg by mouth every 8 (eight) hours as needed for muscle spasms.   Yes Historical Provider, MD  Multiple Vitamin (MULTIVITAMIN WITH MINERALS) TABS tablet Take 1 tablet by mouth daily.   Yes Historical Provider, MD  pantoprazole (PROTONIX) 40 MG tablet Take 1 tablet (40 mg total) by mouth 2 (two) times daily. 10/19/14  Yes  Knox Royalty, NP    Physical Exam: Filed Vitals:   12/11/14 0150 12/11/14 0537  BP: 150/81 120/72  Pulse: 83 69  Temp: 97.7 F (36.5 C) 97.9 F (36.6 C)  TempSrc: Oral Oral  Resp: 18 18  Height: 5\' 1"  (1.549 m)   Weight: 58.06 kg (128 lb)   SpO2: 100% 95%    General: Alert, Awake and Oriented to Time, Place and Person. Appear in mild distress Eyes: PERRL ENT: Oral Mucosa clear moist. Neck: no JVD Cardiovascular: S1 and S2 Present, no Murmur, Peripheral Pulses Present Respiratory: Bilateral Air entry equal and Decreased, Clear to Auscultation, noCrackles, no wheezes Abdomen: Bowel Sound present, Soft and non tender Skin: no Rash Extremities: no Pedal edema, no calf  tenderness Neurologic: Grossly no focal neuro deficit.  Labs on Admission:  CBC:  Recent Labs Lab 12/06/14 2011 12/11/14 0230  WBC 9.8 7.6  NEUTROABS 4.0 3.3  HGB 12.4 11.7*  HCT 37.2 35.2*  MCV 98.2 97.5  PLT PLATELET CLUMPS NOTED ON SMEAR, COUNT APPEARS INCREASED 422*    CMP     Component Value Date/Time   NA 140 12/11/2014 0230   K 3.9 12/11/2014 0230   CL 102 12/11/2014 0230   CO2 27 12/11/2014 0230   GLUCOSE 86 12/11/2014 0230   BUN 8 12/11/2014 0230   CREATININE 0.77 12/11/2014 0230   CALCIUM 9.5 12/11/2014 0230   PROT 6.9 12/11/2014 0230   ALBUMIN 4.2 12/11/2014 0230   AST 32 12/11/2014 0230   ALT 28 12/11/2014 0230   ALKPHOS 71 12/11/2014 0230   BILITOT 0.8 12/11/2014 0230   GFRNONAA >90 12/11/2014 0230   GFRAA >90 12/11/2014 0230    No results for input(s): LIPASE, AMYLASE in the last 168 hours.   Recent Labs Lab 12/06/14 2011  CKTOTAL 93   BNP (last 3 results) No results for input(s): BNP in the last 8760 hours.  ProBNP (last 3 results) No results for input(s): PROBNP in the last 8760 hours.   Radiological Exams on Admission: Ct Head Wo Contrast  12/10/2014   CLINICAL DATA:  Multiple seizures today.  EXAM: CT HEAD WITHOUT CONTRAST  TECHNIQUE: Contiguous axial  images were obtained from the base of the skull through the vertex without intravenous contrast.  COMPARISON:  Head CT 11/25/14  FINDINGS: No intracranial hemorrhage, mass effect, or midline shift. No hydrocephalus. The basilar cisterns are patent. No evidence of territorial infarct. No intracranial fluid collection. Previous left frontal scalp contusion has resolved. Calvarium is intact. Included paranasal sinuses and mastoid air cells are well aerated.  IMPRESSION: No acute intracranial abnormality.   Electronically Signed   By: Jeb Levering M.D.   On: 12/10/2014 23:59    EKG: Independently reviewed. normal sinus rhythm, nonspecific ST and T waves changes.  Assessment/Plan Principal Problem:   Seizure-like activity Active Problems:   Suicidal ideation   Bipolar 1 disorder, mixed, severe   1. Seizure-like activity Patient presented with seizure-like activity. It wasn't witnessed since. Currently she does not appear to have any acute neurological deficit on only complaint is tooth ache. CT of the head is negative. Initial blood work unremarkable. The patient has been evaluated by the neurology who recommends continue Keppra and EEG in the morning. The patient will be monitored with seizure prophylaxis. Use lorazepam as needed.  2. Suicide ideation. Recently admitted for the same. Wasn't admitted in the V of her health. Psychiatry will be consulted in the morning. Patient was on 15 minute monitoring.  3. GERD. Continue PPI.  Advance goals of care discussion: full code   Consults: neurology  DVT Prophylaxis: subcutaneous Heparin. Nutrition: regular diet  Disposition: Admitted to inpatient in telemetry unit.  Author: Berle Mull, MD [facility] Pager: [phone]    If 7PM-7AM, please contact night-coverage [email or web address] Password [email or web address]

## 2014-12-11 NOTE — Progress Notes (Signed)
Patient has clothes in the laundry room at [facility].  Clothing given to security to give to patient at [facility] .

## 2014-12-11 NOTE — Progress Notes (Signed)
NEURO HOSPITALIST PROGRESS NOTE   SUBJECTIVE:                                                                                                                        Patient complains of muscle soreness and head pain. She did have a prolonged spell while undergoing EEG described as " patient getting nervous, violent jerking movement with huge back arch, able to open eyes upon commands but for the most part with eyes close during the event". Although the recording is obscured by muscle artifact, there was not frank background changes associated with the event. Mrs. Lineman indicated that few years ago she had extensive seizure investigations including admission to the epilepsy unit at [facility] and she was told that she has non epileptic seizures. She is on keppra 500 mg BID.   OBJECTIVE:                                                                                                                           Vital signs in last 24 hours: Temp:  [97.7 F (36.5 C)-97.9 F (36.6 C)] 97.9 F (36.6 C) (03/23 0537) Pulse Rate:  [69-83] 69 (03/23 0537) Resp:  [18] 18 (03/23 0537) BP: (120-150)/(72-81) 120/72 mmHg (03/23 0537) SpO2:  [95 %-100 %] 95 % (03/23 0537) Weight:  [58.06 kg (128 lb)] 58.06 kg (128 lb) (03/23 0150)  Intake/Output from previous day:   Intake/Output this shift: Total I/O In: 360 [P.O.:360] Out: -  Nutritional status: Diet regular  Past Medical History  Diagnosis Date  . Migraines   . Endometriosis   . ETOH abuse     sober 3 1/2 years  . Anorexia   . Depression   . Anxiety   . DJD (degenerative joint disease) of cervical spine   . Seizures   . PICC (peripherally inserted central catheter) in place     rt neck   Physical exam: pleasant female in no apparent distress. Head: normocephalic. Neck: supple, no bruits, no JVD. Cardiac: no murmurs. Lungs: clear. Abdomen: soft, no tender, no mass. Extremities: no  edema. Skin: no rash  Neurologic Exam:  General: Mental Status: Alert, oriented, thought content appropriate.  Speech fluent without evidence of aphasia.  Able to  follow 3 step commands without difficulty. Cranial Nerves: II: Discs flat bilaterally; Visual fields grossly normal, pupils equal, round, reactive to light and accommodation III,IV, VI: ptosis not present, extra-ocular motions intact bilaterally V,VII: smile symmetric, facial light touch sensation normal bilaterally VIII: hearing normal bilaterally IX,X: uvula rises symmetrically XI: bilateral shoulder shrug XII: midline tongue extension without atrophy or fasciculations Motor: Right : Upper extremity   5/5    Left:     Upper extremity   5/5  Lower extremity   5/5     Lower extremity   5/5 Tone and bulk:normal tone throughout; no atrophy noted Sensory: Pinprick and light touch intact throughout, bilaterally Deep Tendon Reflexes:  Right: Upper Extremity   Left: Upper extremity   biceps (C-5 to C-6) 2/4   biceps (C-5 to C-6) 2/4 tricep (C7) 2/4    triceps (C7) 2/4 Brachioradialis (C6) 2/4  Brachioradialis (C6) 2/4  Lower Extremity Lower Extremity  quadriceps (L-2 to L-4) 2/4   quadriceps (L-2 to L-4) 2/4 Achilles (S1) 2/4   Achilles (S1) 2/4  Plantars: Right: downgoing   Left: downgoing Cerebellar: normal finger-to-nose,  normal heel-to-shin test Gait:  No tested for safety reasons     Lab Results: Lab Results  Component Value Date/Time   CHOL 174 10/12/2014 06:30 AM   Lipid Panel No results for input(s): CHOL, TRIG, HDL, CHOLHDL, VLDL, LDLCALC in the last 72 hours.  Studies/Results: Ct Head Wo Contrast  12/10/2014   CLINICAL DATA:  Multiple seizures today.  EXAM: CT HEAD WITHOUT CONTRAST  TECHNIQUE: Contiguous axial images were obtained from the base of the skull through the vertex without intravenous contrast.  COMPARISON:  Head CT 11/25/14  FINDINGS: No intracranial hemorrhage, mass effect, or midline shift.  No hydrocephalus. The basilar cisterns are patent. No evidence of territorial infarct. No intracranial fluid collection. Previous left frontal scalp contusion has resolved. Calvarium is intact. Included paranasal sinuses and mastoid air cells are well aerated.  IMPRESSION: No acute intracranial abnormality.   Electronically Signed   By: Jeb Levering M.D.   On: 12/10/2014 23:59    MEDICATIONS                                                                                                                        Scheduled: . FLUoxetine  60 mg Oral Daily  . gabapentin  300 mg Oral TID  . heparin  5,000 Units Subcutaneous 3 times per day  . levETIRAcetam  500 mg Oral BID  . lisinopril  20 mg Oral Daily  . pantoprazole  40 mg Oral BID  . sodium chloride  3 mL Intravenous Q12H    ASSESSMENT/PLAN:  49 y/o with depression, seizure, chronic pain, suicide attempts, and recurrent paroxysmal hyperkinetic spells previously characterized as non epileptic seizures at [facility] (as per patient report), admitted with recurrent seizures. She did have one of habitual spells earlier today at the time of undergoing routine EEG. Although there was not video at the time of the event, the semiology of the event and the fact that there was not associated concomitant electrographic changes are most consistent with a non epileptic event. Hence, will not suggest changes in her current dose of keppra which I believe will need to be completely wean off as outpatient as her spells are known to be non epileptic. Will sign off.   Dorian Pod, MD [facility] [phone]  12/11/2014, 12:13 PM
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
## 2014-12-11 NOTE — ED Notes (Signed)
Pt comes from [facility] where she began seizing since 9pm. Pt given 4mg  of Ativan IM and 5mg  of versed IM. Pt post-ictal upon arrival. Pt said by staff to have hit forehead on side table during seizure.

## 2014-12-11 NOTE — Progress Notes (Addendum)
Per psychiatrist, patient is recommended for higher level of care at [facility] due to no improvement with suicidal ideation. Per chart review and discussion with psychiatrist, patient attempted to strangle self with bed sheet  and patient began to have numerous psuedosieuzres following attempt. This attempt was documented on 3/16. CSW to complete CSW psych assessment and refer patient to [facility]. CSW updated RN.  Belia Heman, [place] Work  [employer] [phone]

## 2014-12-11 NOTE — Progress Notes (Signed)
CSW referred patient to [facility]. CSW obtained [facility] authorization # [id number]. CSW completed phone demographics with [place].   Belia Heman, [place] Work  [employer] [phone]

## 2014-12-11 NOTE — Progress Notes (Signed)
EEG completed; results pending.    

## 2014-12-11 NOTE — Progress Notes (Signed)
EEG in progress. Patient started seziure. Which lasted several minutes. Rapid response called. Ativan given patient, responded to medication. Patient currently in bed. Alert and oriented to name, place and time. Will continue to monitor.

## 2014-12-11 NOTE — Progress Notes (Signed)
Patient Discharge Instructions:  Next Level Care Provider Has Access to the EMR, 12/11/14  The patient was transferred to [place].  The entire medical record is made available via CHL/Epic access.   Patsey Berthold, 12/11/2014, 1:48 PM

## 2014-12-11 NOTE — Progress Notes (Signed)
Per charge nurse at [facility] patient will be admitted to the medical floor.  Patient belongings were retrieved from her room and locker 55.  All belongings there and given to security to take to patient.

## 2014-12-12 DIAGNOSIS — F319 Bipolar disorder, unspecified: Secondary | ICD-10-CM | POA: Diagnosis not present

## 2014-12-12 DIAGNOSIS — R45851 Suicidal ideations: Secondary | ICD-10-CM | POA: Diagnosis not present

## 2014-12-12 DIAGNOSIS — F3163 Bipolar disorder, current episode mixed, severe, without psychotic features: Secondary | ICD-10-CM | POA: Diagnosis not present

## 2014-12-12 DIAGNOSIS — R569 Unspecified convulsions: Secondary | ICD-10-CM | POA: Diagnosis not present

## 2014-12-12 DIAGNOSIS — F419 Anxiety disorder, unspecified: Secondary | ICD-10-CM | POA: Diagnosis not present

## 2014-12-12 DIAGNOSIS — T71162D Asphyxiation due to hanging, intentional self-harm, subsequent encounter: Secondary | ICD-10-CM | POA: Diagnosis not present

## 2014-12-12 LAB — PROLACTIN: Prolactin: 18.1 ng/mL (ref 4.8–23.3)

## 2014-12-12 MED ORDER — CARISOPRODOL 350 MG PO TABS
350.0000 mg | ORAL_TABLET | Freq: Every day | ORAL | Status: DC
  Administered 2014-12-12 – 2014-12-14 (×2): 350 mg via ORAL
  Filled 2014-12-12 (×2): qty 1

## 2014-12-12 NOTE — Progress Notes (Signed)
Per Marlowe Kays pt is on [facility] waitlist.   Belia Heman, [place] Work  [facility] [phone]
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
## 2014-12-12 NOTE — Progress Notes (Signed)
Pt had seizure in room lasting approximately 15 minutes. PRN Ativan given which provided relief. Rapid Response RN called and vitals taken: HR- 92 O2 Sat- 96% BP 166/86. Pt currently NSR on telemetry monitor. Will continue to monitor pt.

## 2014-12-12 NOTE — Progress Notes (Signed)
PROGRESS NOTE  [name] GYI:[id number] DOB: [birth year] DOA: 12/11/2014 PCP: No PCP Per Patient  HPI: [name] is a 49 y.o. female with Past medical history of GERD, anxiety, depression, bipolar disorder. The patient is presenting with seizure-like episode. Patient has prior history of seizure and she was treated with Keppra and the past. Patient recently presented with suicidal ideation and Tylenol overdose on 11/25/2014. Patient will discharge to behavioral health. In the [place] patient had a seizure-like activity on 318 at which time she was seen in the ER and it was felt like pseudoseizure and therefore she was sent back to behavioral health. She had another episode on 12/08/2014 at which time again she was transferred to behavioral health as she was able to beredirected during the seizure. Today she had an event at which time she was holding her hands and head shaking and unresponsiveness of the whole body. she was given lorazepam twice control the seizure there was no tongue bite she did not have any urine or bowel incontinence. There was no fall.  Subjective / 24 H Interval events - one more episode of spell overnight - complains of inability to sleep this morning  Assessment/Plan: Principal Problem:   Seizure-like activity Active Problems:   Suicidal ideation   Bipolar 1 disorder, mixed, severe   Suicide attempt by hanging  Seizure-like activity  - recurrent episode 3/23 while undergoing her EEG and 3/24 overnight - neuro consulted, appreciate input, non epileptic event pet EEG, neurology signed off 3/23 - no bowel/bladder incontinence/toungue biting - no post ictal features as patient oriented to time/place/person within few minutes of receiving Ativan - continue Keppra  Depression/anxiety/bipolar disorder - multiple suicide attempts with Tylenol being hospitalized here and at [facility] - psychiatry consulted and recommended long term inpatient psychiatry, SW  consulted, awaiting bed - patient continued to be suicidal and tried to choke herself with bed sheets while in acute psychiatric hospital  - continue her current medication regimen, suicide precautions/sitter  GERD - continue PPI  HTN - continue Lisinopril   Diet: Diet regular Fluids: none DVT Prophylaxis: heparin  Code Status: Full Code Family Communication: none bedside  Disposition Plan: inpatient psych when bed available, medically ready  Consultants:  Psychiatry   Neurology   Procedures:  EEG 3/23    Antibiotics  Anti-infectives    None       Studies  Ct Head Wo Contrast  12/10/2014   CLINICAL DATA:  Multiple seizures today.  EXAM: CT HEAD WITHOUT CONTRAST  TECHNIQUE: Contiguous axial images were obtained from the base of the skull through the vertex without intravenous contrast.  COMPARISON:  Head CT 11/25/14  FINDINGS: No intracranial hemorrhage, mass effect, or midline shift. No hydrocephalus. The basilar cisterns are patent. No evidence of territorial infarct. No intracranial fluid collection. Previous left frontal scalp contusion has resolved. Calvarium is intact. Included paranasal sinuses and mastoid air cells are well aerated.  IMPRESSION: No acute intracranial abnormality.   Electronically Signed   By: Jeb Levering M.D.   On: 12/10/2014 23:59    Objective  Filed Vitals:   12/11/14 0537 12/11/14 1404 12/11/14 2217 12/12/14 0635  BP: 120/72 117/60 115/70 132/64  Pulse: 69 93 77 76  Temp: 97.9 F (36.6 C) 98.1 F (36.7 C) 98.5 F (36.9 C) 98.2 F (36.8 C)  TempSrc: Oral Oral Oral Oral  Resp: 18 18 18 18   Height:      Weight:      SpO2: 95% 96% 97%  98%    Intake/Output Summary (Last 24 hours) at 12/12/14 1040 Last data filed at 12/12/14 0834  [place] per 24 hour  Intake    720 ml  Output      0 ml  Net    720 ml   Filed Weights   12/11/14 0150  Weight: 58.06 kg (128 lb)    Exam:  General:  NAD  HEENT: no scleral  icterus  Cardiovascular: RRR without MRG, 2+ peripheral pulses, no edema  Respiratory: CTA biL, good air movement, no wheezing, no crackles, no rales  Abdomen: soft, non tender, BS +, no guarding  Data Reviewed: Basic Metabolic Panel:  Recent Labs Lab 12/06/14 2011 12/08/14 1957 12/11/14 0230  NA 140 138 140  K 4.7 4.9 3.9  CL 105 102 102  CO2 22 25 27   GLUCOSE 95 87 86  BUN 7 9 8   CREATININE 0.84 0.85 0.77  CALCIUM 9.6 9.6 9.5   Liver Function Tests:  Recent Labs Lab 12/06/14 2011 12/11/14 0230  AST 42* 32  ALT 31 28  ALKPHOS 92 71  BILITOT 0.9 0.8  PROT 7.5 6.9  ALBUMIN 4.5 4.2   CBC:  Recent Labs Lab 12/06/14 2011 12/11/14 0230  WBC 9.8 7.6  NEUTROABS 4.0 3.3  HGB 12.4 11.7*  HCT 37.2 35.2*  MCV 98.2 97.5  PLT PLATELET CLUMPS NOTED ON SMEAR, COUNT APPEARS INCREASED 422*   Cardiac Enzymes:  Recent Labs Lab 12/06/14 2011  CKTOTAL 93   CBG:  Recent Labs Lab 12/06/14 1813 12/10/14 2113  GLUCAP 126* 100*   Scheduled Meds: . carisoprodol  350 mg Oral QHS  . FLUoxetine  60 mg Oral Daily  . gabapentin  300 mg Oral TID  . heparin  5,000 Units Subcutaneous 3 times per day  . levETIRAcetam  500 mg Oral BID  . lisinopril  20 mg Oral Daily  . pantoprazole  40 mg Oral BID  . sodium chloride  3 mL Intravenous Q12H   Continuous Infusions:    Marzetta Board, MD [facility] Pager [phone]. If 7 PM - 7 AM, please contact night-coverage at [email or web address], password [facility] 12/12/2014, 10:40 AM  LOS: 1 day

## 2014-12-12 NOTE — Progress Notes (Signed)
UR completed 

## 2014-12-13 DIAGNOSIS — R569 Unspecified convulsions: Secondary | ICD-10-CM | POA: Diagnosis not present

## 2014-12-13 DIAGNOSIS — T71162D Asphyxiation due to hanging, intentional self-harm, subsequent encounter: Secondary | ICD-10-CM | POA: Diagnosis not present

## 2014-12-13 DIAGNOSIS — F3163 Bipolar disorder, current episode mixed, severe, without psychotic features: Secondary | ICD-10-CM | POA: Diagnosis not present

## 2014-12-13 DIAGNOSIS — F419 Anxiety disorder, unspecified: Secondary | ICD-10-CM | POA: Diagnosis not present

## 2014-12-13 DIAGNOSIS — R45851 Suicidal ideations: Secondary | ICD-10-CM | POA: Diagnosis not present

## 2014-12-13 DIAGNOSIS — F319 Bipolar disorder, unspecified: Secondary | ICD-10-CM | POA: Diagnosis not present

## 2014-12-13 MED ORDER — LORAZEPAM 2 MG/ML IJ SOLN
INTRAMUSCULAR | Status: AC
  Administered 2014-12-14: 1.5 mg
  Filled 2014-12-13: qty 1

## 2014-12-13 MED ORDER — LORAZEPAM 2 MG/ML IJ SOLN
0.5000 mg | INTRAMUSCULAR | Status: DC | PRN
  Administered 2014-12-14 (×2): 0.5 mg via INTRAMUSCULAR
  Filled 2014-12-13: qty 1

## 2014-12-13 NOTE — Progress Notes (Signed)
UR completed 
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
## 2014-12-13 NOTE — Clinical Social Work Note (Signed)
Per CSW, Belia Heman, LCSW, patient is on the waiting list at [facility]. She also has been referred to:  Freddi Starr [place], MSW, [place]

## 2014-12-13 NOTE — Progress Notes (Signed)
PROGRESS NOTE  [name] QIW:[id number] DOB: 03/09/1966 DOA: 12/11/2014 PCP: No PCP Per Patient  HPI: [name] is a 49 y.o. female with Past medical history of GERD, anxiety, depression, bipolar disorder and recurrent suicide attempts. The patient is presenting with seizure-like episode from [facility].  Subjective / 24 H Interval events - one more episode of spell overnight - complains of inability to sleep this morning  Assessment/Plan: Principal Problem:   Bipolar 1 disorder, mixed, severe Active Problems:   Suicidal ideation   Seizure-like activity   Suicide attempt by hanging  Seizure-like activity  - recurrent episodes - neuro consulted, appreciate input, non epileptic event per EEG, neurology signed off 3/23 - no bowel/bladder incontinence/toungue biting - no post ictal features as patient oriented to time/place/person within few minutes of receiving Ativan - continue Keppra  Depression/anxiety/bipolar disorder - multiple suicide attempts with Tylenol being hospitalized here and at [facility] - psychiatry consulted and recommended long term inpatient psychiatry, SW consulted, awaiting bed - patient continued to be suicidal and tried to choke herself with bed sheets while in acute psychiatric hospital  - continue her current medication regimen, suicide precautions/sitter  GERD - continue PPI  HTN - continue Lisinopril   Diet: Diet regular Fluids: none DVT Prophylaxis: heparin  Code Status: Full Code Family Communication: none bedside  Disposition Plan: inpatient psych when bed available, medically ready  Consultants:  Psychiatry   Neurology   Procedures:  EEG 3/23    Antibiotics  Anti-infectives    None       Studies  No results found.  Objective  Filed Vitals:   12/11/14 2217 12/12/14 0635 12/12/14 1534 12/12/14 2136  BP: 115/70 132/64 142/91 116/64  Pulse: 77 76 87 79  Temp: 98.5 F (36.9 C) 98.2 F (36.8 C) 98.1 F (36.7 C) 98.3 F (36.8  C)  TempSrc: Oral Oral Axillary Oral  Resp: 18 18 18 20   Height:      Weight:      SpO2: 97% 98% 97% 97%    Intake/Output Summary (Last 24 hours) at 12/13/14 0912 Last data filed at 12/12/14 1829  Gross per 24 hour  Intake    240 ml  Output      0 ml  Net    240 ml   Filed Weights   12/11/14 0150  Weight: 58.06 kg (128 lb)    Exam:  General:  NAD  HEENT: no scleral icterus  Cardiovascular: RRR  Respiratory: CTA biL  Data Reviewed: Basic Metabolic Panel:  Recent Labs Lab 12/06/14 2011 12/08/14 1957 12/11/14 0230  NA 140 138 140  K 4.7 4.9 3.9  CL 105 102 102  CO2 22 25 27   GLUCOSE 95 87 86  BUN 7 9 8   CREATININE 0.84 0.85 0.77  CALCIUM 9.6 9.6 9.5   Liver Function Tests:  Recent Labs Lab 12/06/14 2011 12/11/14 0230  AST 42* 32  ALT 31 28  ALKPHOS 92 71  BILITOT 0.9 0.8  PROT 7.5 6.9  ALBUMIN 4.5 4.2   CBC:  Recent Labs Lab 12/06/14 2011 12/11/14 0230  WBC 9.8 7.6  NEUTROABS 4.0 3.3  HGB 12.4 11.7*  HCT 37.2 35.2*  MCV 98.2 97.5  PLT PLATELET CLUMPS NOTED ON SMEAR, COUNT APPEARS INCREASED 422*   Cardiac Enzymes:  Recent Labs Lab 12/06/14 2011  CKTOTAL 93   CBG:  Recent Labs Lab 12/06/14 1813 12/10/14 2113  GLUCAP 126* 100*   Scheduled Meds: . carisoprodol  350 mg Oral QHS  .  FLUoxetine  60 mg Oral Daily  . gabapentin  300 mg Oral TID  . heparin  5,000 Units Subcutaneous 3 times per day  . levETIRAcetam  500 mg Oral BID  . lisinopril  20 mg Oral Daily  . pantoprazole  40 mg Oral BID  . sodium chloride  3 mL Intravenous Q12H   Continuous Infusions:    Marzetta Board, MD [facility] Pager [phone]. If 7 PM - 7 AM, please contact night-coverage at [email or web address], password [facility] 12/13/2014, 9:12 AM  LOS: 2 days

## 2014-12-14 DIAGNOSIS — F3163 Bipolar disorder, current episode mixed, severe, without psychotic features: Secondary | ICD-10-CM | POA: Diagnosis not present

## 2014-12-14 DIAGNOSIS — R569 Unspecified convulsions: Secondary | ICD-10-CM | POA: Diagnosis not present

## 2014-12-14 DIAGNOSIS — F319 Bipolar disorder, unspecified: Secondary | ICD-10-CM | POA: Diagnosis not present

## 2014-12-14 LAB — CBC
HCT: 36.5 % (ref 36.0–46.0)
Hemoglobin: 12 g/dL (ref 12.0–15.0)
MCH: 32 pg (ref 26.0–34.0)
MCHC: 32.9 g/dL (ref 30.0–36.0)
MCV: 97.3 fL (ref 78.0–100.0)
PLATELETS: 412 10*3/uL — AB (ref 150–400)
RBC: 3.75 MIL/uL — ABNORMAL LOW (ref 3.87–5.11)
RDW: 13 % (ref 11.5–15.5)
WBC: 7.8 10*3/uL (ref 4.0–10.5)

## 2014-12-14 LAB — BASIC METABOLIC PANEL
ANION GAP: 10 (ref 5–15)
BUN: 9 mg/dL (ref 6–23)
CALCIUM: 9.4 mg/dL (ref 8.4–10.5)
CO2: 27 mmol/L (ref 19–32)
CREATININE: 0.71 mg/dL (ref 0.50–1.10)
Chloride: 102 mmol/L (ref 96–112)
GFR calc Af Amer: >90 mL/min (ref >90)
GFR calc non Af Amer: >90 mL/min (ref >90)
Glucose, Bld: 82 mg/dL (ref 70–99)
POTASSIUM: 3.9 mmol/L (ref 3.5–5.1)
Sodium: 139 mmol/L (ref 135–145)

## 2014-12-14 MED ORDER — LORAZEPAM 0.5 MG PO TABS
0.5000 mg | ORAL_TABLET | Freq: Once | ORAL | Status: AC
  Administered 2014-12-14: 0.5 mg via ORAL
  Filled 2014-12-14: qty 1

## 2014-12-14 NOTE — Progress Notes (Signed)
PROGRESS NOTE  [name] LKT:[id number] DOB: 1966/03/12 DOA: 12/11/2014 PCP: No PCP Per Patient  HPI: [name] is a 49 y.o. female with Past medical history of GERD, anxiety, depression, bipolar disorder and recurrent suicide attempts. The patient is presenting with seizure-like episode from [facility].  Subjective / 24 H Interval events - recurrent seizure like episodes, about once per day - she has no complaints today   Assessment/Plan: Principal Problem:   Bipolar 1 disorder, mixed, severe Active Problems:   Suicidal ideation   Seizure-like activity   Suicide attempt by hanging  Seizure-like activity  - recurrent episodes - neuro consulted, appreciate input, non epileptic event per EEG, neurology signed off 3/23 - no bowel/bladder incontinence/toungue biting - no post ictal features as patient oriented to time/place/person within few minutes of receiving Ativan - continue Keppra  Depression/anxiety/bipolar disorder - multiple suicide attempts with Tylenol being hospitalized here and at [facility] - psychiatry consulted and recommended long term inpatient psychiatry, SW consulted, awaiting bed - patient continued to be suicidal and tried to choke herself with bed sheets while in acute psychiatric hospital  - continue her current medication regimen, suicide precautions/sitter  GERD - continue PPI  HTN - continue Lisinopril   Diet: Diet regular Fluids: none DVT Prophylaxis: heparin  Code Status: Full Code Family Communication: none bedside  Disposition Plan: inpatient psych when bed available, medically ready  Consultants:  Psychiatry   Neurology   Procedures:  EEG 3/23    Antibiotics  Anti-infectives    None       Studies  No results found.  Objective  Filed Vitals:   12/13/14 1542 12/13/14 2140 12/14/14 0612 12/14/14 1100  BP: 126/70 133/88 132/80 150/84  Pulse: 88 109 68 78  Temp: 97.5 F (36.4 C) 98.2 F (36.8 C) 98.4 F (36.9 C) 98.1 F  (36.7 C)  TempSrc: Oral Oral Oral Oral  Resp: 20 18 18 18   Height:      Weight:      SpO2: 100% 97% 97% 97%    Intake/Output Summary (Last 24 hours) at 12/14/14 1240 Last data filed at 12/13/14 1837  Gross per 24 hour  Intake    360 ml  Output      0 ml  Net    360 ml   Filed Weights   12/11/14 0150  Weight: 58.06 kg (128 lb)    Exam:  General:  NAD  HEENT: no scleral icterus  Cardiovascular: RRR  Respiratory: CTA biL  Data Reviewed: Basic Metabolic Panel:  Recent Labs Lab 12/08/14 1957 12/11/14 0230 12/14/14 0558  NA 138 140 139  K 4.9 3.9 3.9  CL 102 102 102  CO2 25 27 27   GLUCOSE 87 86 82  BUN 9 8 9   CREATININE 0.85 0.77 0.71  CALCIUM 9.6 9.5 9.4   Liver Function Tests:  Recent Labs Lab 12/11/14 0230  AST 32  ALT 28  ALKPHOS 71  BILITOT 0.8  PROT 6.9  ALBUMIN 4.2   CBC:  Recent Labs Lab 12/11/14 0230 12/14/14 0558  WBC 7.6 7.8  NEUTROABS 3.3  --   HGB 11.7* 12.0  HCT 35.2* 36.5  MCV 97.5 97.3  PLT 422* 412*   Cardiac Enzymes: No results for input(s): CKTOTAL, CKMB, CKMBINDEX, TROPONINI in the last 168 hours. CBG:  Recent Labs Lab 12/10/14 2113  GLUCAP 100*   Scheduled Meds: . carisoprodol  350 mg Oral QHS  . FLUoxetine  60 mg Oral Daily  . gabapentin  300  mg Oral TID  . heparin  5,000 Units Subcutaneous 3 times per day  . levETIRAcetam  500 mg Oral BID  . lisinopril  20 mg Oral Daily  . pantoprazole  40 mg Oral BID  . sodium chloride  3 mL Intravenous Q12H   Continuous Infusions:    Marzetta Board, MD [facility] Pager [phone]. If 7 PM - 7 AM, please contact night-coverage at [email or web address], password [facility] 12/14/2014, 12:40 PM  LOS: 3 days

## 2014-12-14 NOTE — Progress Notes (Signed)
This shift  pt had seizure like activity and fell out of bed to floor during this activity. Pt turned to side while on floor with jerking motions to BLE. No increased salivating, tongue bite or incontinence of bowel/urine. Eyes closed. During this episode IV access lost. Hospitalist paged and orders given. Immediately after episode pt came too conversating and discussing medications.  BP 133 /88 HR 109 R 18. Will continue to monitor and intervene as appropriate.

## 2014-12-14 NOTE — Progress Notes (Signed)
This shift pt req Chaplain services. Requesting to s/w [name] only, because of her established relationship with him at [facility]. Text paged chaplain services and informed he works Monday-Friday and her name will be forwarded. Relayed this information to patient.

## 2014-12-14 NOTE — Progress Notes (Signed)
Chaplain paged at 2200 with a request from [name] to speak to [name]. Chaplain related that [employer] was not on duty presently and that the chaplain was on his way, [name] related that she only wished to speak to [employer] and does not wish to speak to any other chaplain. [name] related she has a relationship with the chaplain and she does not wish to start a  new relationship with another chaplain.   Note passed to [facility], requesting he visit [name] on Monday, 28 March or as soon as possible.  [name]. [name], [place]

## 2014-12-14 NOTE — Progress Notes (Signed)
UR completed 

## 2014-12-15 DIAGNOSIS — F319 Bipolar disorder, unspecified: Secondary | ICD-10-CM | POA: Diagnosis not present

## 2014-12-15 MED ORDER — LORAZEPAM 0.5 MG PO TABS
0.5000 mg | ORAL_TABLET | Freq: Once | ORAL | Status: AC
  Administered 2014-12-15: 0.5 mg via ORAL

## 2014-12-15 MED ORDER — LORAZEPAM 0.5 MG PO TABS
0.5000 mg | ORAL_TABLET | Freq: Once | ORAL | Status: AC
  Administered 2014-12-15: 0.5 mg via ORAL
  Filled 2014-12-15: qty 1

## 2014-12-15 MED ORDER — CARISOPRODOL 350 MG PO TABS
350.0000 mg | ORAL_TABLET | Freq: Every evening | ORAL | Status: DC | PRN
  Administered 2014-12-15 – 2014-12-17 (×3): 350 mg via ORAL
  Filled 2014-12-15 (×3): qty 1

## 2014-12-15 MED ORDER — LORAZEPAM 0.5 MG PO TABS
ORAL_TABLET | ORAL | Status: AC
  Filled 2014-12-15: qty 1

## 2014-12-15 NOTE — Progress Notes (Signed)
PROGRESS NOTE  [name] JOI:[id number] DOB: Feb 02, 1966 DOA: 12/11/2014 PCP: No PCP Per Patient  HPI: [name] is a 49 y.o. female with Past medical history of GERD, anxiety, depression, bipolar disorder and recurrent suicide attempts. The patient is presenting with seizure-like episode from [facility].  Subjective / 24 H Interval events - lying in bed, sleeping, not interactive today  Assessment/Plan: Principal Problem:   Bipolar 1 disorder, mixed, severe Active Problems:   Suicidal ideation   Seizure-like activity   Suicide attempt by hanging  Medical issues stable today, no changes.   Seizure-like activity  - recurrent episodes - neuro consulted, appreciate input, non epileptic event per EEG, neurology signed off 3/23 - no bowel/bladder incontinence/toungue biting - no post ictal features as patient oriented to time/place/person within few minutes of receiving Ativan - continue Keppra  Depression/anxiety/bipolar disorder - multiple suicide attempts with Tylenol being hospitalized here and at [facility] - psychiatry consulted and recommended long term inpatient psychiatry, SW consulted, awaiting bed - patient continued to be suicidal and tried to choke herself with bed sheets while in acute psychiatric hospital  - continue her current medication regimen, suicide precautions/sitter  GERD - continue PPI  HTN - continue Lisinopril   Diet: Diet regular Fluids: none DVT Prophylaxis: heparin  Code Status: Full Code Family Communication: none bedside  Disposition Plan: inpatient psych when bed available, medically ready  Consultants:  Psychiatry   Neurology   Procedures:  EEG 3/23    Antibiotics  Anti-infectives    None       Studies  No results found.  Objective  Filed Vitals:   12/14/14 1100 12/14/14 2154 12/15/14 0500 12/15/14 0528  BP: 150/84 122/78  135/78  Pulse: 78 93  85  Temp: 98.1 F (36.7 C) 97.5 F (36.4 C)  98.2 F (36.8 C)  TempSrc:  Oral Oral  Oral  Resp: 18 18  18   Height:      Weight:   62.3 kg (137 lb 5.6 oz)   SpO2: 97% 100%  100%    Intake/Output Summary (Last 24 hours) at 12/15/14 1201 Last data filed at 12/15/14 0803  Gross per 24 hour  Intake    720 ml  Output      0 ml  Net    720 ml   Filed Weights   12/11/14 0150 12/15/14 0500  Weight: 58.06 kg (128 lb) 62.3 kg (137 lb 5.6 oz)    Exam:  General:  NAD, sleeping  Data Reviewed: Basic Metabolic Panel:  Recent Labs Lab 12/08/14 1957 12/11/14 0230 12/14/14 0558  NA 138 140 139  K 4.9 3.9 3.9  CL 102 102 102  CO2 25 27 27   GLUCOSE 87 86 82  BUN 9 8 9   CREATININE 0.85 0.77 0.71  CALCIUM 9.6 9.5 9.4   Liver Function Tests:  Recent Labs Lab 12/11/14 0230  AST 32  ALT 28  ALKPHOS 71  BILITOT 0.8  PROT 6.9  ALBUMIN 4.2   CBC:  Recent Labs Lab 12/11/14 0230 12/14/14 0558  WBC 7.6 7.8  NEUTROABS 3.3  --   HGB 11.7* 12.0  HCT 35.2* 36.5  MCV 97.5 97.3  PLT 422* 412*   Cardiac Enzymes: No results for input(s): CKTOTAL, CKMB, CKMBINDEX, TROPONINI in the last 168 hours. CBG:  Recent Labs Lab 12/10/14 2113  GLUCAP 100*   Scheduled Meds: . FLUoxetine  60 mg Oral Daily  . gabapentin  300 mg Oral TID  . heparin  5,000 Units  Subcutaneous 3 times per day  . levETIRAcetam  500 mg Oral BID  . lisinopril  20 mg Oral Daily  . LORazepam      . pantoprazole  40 mg Oral BID  . sodium chloride  3 mL Intravenous Q12H   Continuous Infusions:    Marzetta Board, MD [facility] Pager [phone]. If 7 PM - 7 AM, please contact night-coverage at [email or web address], password [facility] 12/15/2014, 12:01 PM  LOS: 4 days

## 2014-12-15 NOTE — Progress Notes (Signed)
This shift pt advised RN that she is having increased anxiety levels and informing of this to prevent any seizure episodes. Pt began to have jerking motion with BLE. Hospitalist contacted and order for 0.5 mg Ativan PO given. Immediately after this dose given  pt found resting comfortably with no further jerking motion of lower extremities. After 1 hour fast asleep. Will continue to monitor pt and intervene approrpiately

## 2014-12-15 NOTE — Progress Notes (Signed)
Chaplain visited while rounding. Ms Hassan was resting when chaplain entered the room, Ms Royse consented to talk. Ms Morace voiced a low desire to live because of her mother and family wish to have nothing to do with her. She voiced concern that she does not believe in God as much as she did earlier in life and that the broken connection with God has given her a deeper feeling of isolation in life. She stated she feels unloved and unaccepted in life. When the chaplain mentioned that she was loved by those that are caring for her and she was of value/worth to [place], she lightened her attitude. She is requesting a visit from a specific weekday chaplain. She was told that a note to that chaplain has been forwarded. She smiled in anticipation of speaking with that chaplain.  Ms Thackeray exhibited a positive bond with her sitter, an excellent example of a caring relationship she says she wishes she had with others.  Based on her report of feeling in a broken relationship with God and her family, she seems to still exhibit SI in an effort to end the hopeless life she says she leads.   Recommend a morning visit by weekday chaplain as requested by this patient.  Sallee Lange. Cortlandt Capuano, [place]

## 2014-12-15 NOTE — Progress Notes (Signed)
UR completed 

## 2014-12-16 DIAGNOSIS — R45851 Suicidal ideations: Secondary | ICD-10-CM | POA: Diagnosis not present

## 2014-12-16 DIAGNOSIS — F3163 Bipolar disorder, current episode mixed, severe, without psychotic features: Secondary | ICD-10-CM | POA: Diagnosis not present

## 2014-12-16 DIAGNOSIS — F319 Bipolar disorder, unspecified: Secondary | ICD-10-CM | POA: Diagnosis not present

## 2014-12-16 DIAGNOSIS — T71162D Asphyxiation due to hanging, intentional self-harm, subsequent encounter: Secondary | ICD-10-CM | POA: Diagnosis not present

## 2014-12-16 DIAGNOSIS — F419 Anxiety disorder, unspecified: Secondary | ICD-10-CM | POA: Diagnosis not present

## 2014-12-16 MED ORDER — FLUOXETINE HCL 20 MG PO CAPS
80.0000 mg | ORAL_CAPSULE | Freq: Every day | ORAL | Status: DC
Start: 2014-12-17 — End: 2014-12-18
  Administered 2014-12-17 – 2014-12-18 (×2): 80 mg via ORAL
  Filled 2014-12-16 (×2): qty 4

## 2014-12-16 MED ORDER — LORAZEPAM 2 MG/ML IJ SOLN
0.5000 mg | Freq: Four times a day (QID) | INTRAMUSCULAR | Status: DC | PRN
  Administered 2014-12-16 – 2014-12-17 (×3): 0.5 mg via INTRAMUSCULAR
  Filled 2014-12-16 (×3): qty 1

## 2014-12-16 MED ORDER — OXYCODONE HCL 5 MG PO TABS
5.0000 mg | ORAL_TABLET | Freq: Four times a day (QID) | ORAL | Status: DC | PRN
  Administered 2014-12-16 – 2014-12-18 (×5): 5 mg via ORAL
  Filled 2014-12-16 (×5): qty 1

## 2014-12-16 MED ORDER — CLONAZEPAM 0.5 MG PO TABS
0.5000 mg | ORAL_TABLET | Freq: Three times a day (TID) | ORAL | Status: DC | PRN
  Administered 2014-12-16 – 2014-12-18 (×6): 0.5 mg via ORAL
  Filled 2014-12-16 (×6): qty 1

## 2014-12-16 MED ORDER — ISOMETHEPTENE-APAP-DICHLORAL 65-325-100 MG PO CAPS
1.0000 | ORAL_CAPSULE | Freq: Two times a day (BID) | ORAL | Status: DC | PRN
  Administered 2014-12-16: 1 via ORAL
  Filled 2014-12-16: qty 1
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
## 2014-12-16 NOTE — Progress Notes (Signed)
PROGRESS NOTE  Khrystyna Schwalm FXT:[id number] DOB: September 09, 1966 DOA: 12/11/2014 PCP: No PCP Per Patient  HPI: [name] is a 49 y.o. female with Past medical history of GERD, anxiety, depression, bipolar disorder and recurrent suicide attempts. The patient is presenting with seizure-like episode from [facility].  Subjective / 24 H Interval events - lying in bed, talkative  Assessment/Plan: Principal Problem:   Bipolar 1 disorder, mixed, severe Active Problems:   Suicidal ideation   Seizure-like activity   Suicide attempt by hanging  Medical issues stable today, no changes.   Seizure-like activity  - recurrent episodes - neuro consulted, appreciate input, non epileptic event per EEG - EEG done while she was having a spell, neurology signed off 3/23 - no bowel/bladder incontinence/toungue biting - no post ictal features as patient oriented to time/place/person within few minutes of receiving Ativan - continue Keppra  Depression/anxiety/bipolar disorder - multiple suicide attempts with Tylenol being hospitalized here and at [facility] - psychiatry consulted and recommended long term inpatient psychiatry, SW consulted, awaiting bed - patient continued to be suicidal and tried to choke herself with bed sheets while in acute psychiatric hospital  - continue her current medication regimen, suicide precautions/sitter  GERD - continue PPI  HTN - continue Lisinopril   Diet: Diet regular Fluids: none DVT Prophylaxis: heparin  Code Status: Full Code Family Communication: none bedside  Disposition Plan: inpatient psych when bed available, medically ready  Consultants:  Psychiatry   Neurology   Procedures:  EEG 3/23    Antibiotics  Anti-infectives    None       Studies  No results found.  Objective  Filed Vitals:   12/15/14 0528 12/15/14 1412 12/15/14 2035 12/16/14 0616  BP: 135/78 135/78 121/64 122/70  Pulse: 85 89 69 65  Temp: 98.2 F (36.8 C) 98.7 F (37.1 C) 98.1  F (36.7 C) 97.6 F (36.4 C)  TempSrc: Oral Oral Oral Oral  Resp: 18 20 18 18   Height:      Weight:      SpO2: 100% 97% 97% 98%    Intake/Output Summary (Last 24 hours) at 12/16/14 1121 Last data filed at 12/15/14 1812  Gross per 24 hour  Intake    720 ml  Output      0 ml  Net    720 ml   Filed Weights   12/11/14 0150 12/15/14 0500  Weight: 58.06 kg (128 lb) 62.3 kg (137 lb 5.6 oz)    Exam:  General:  NAD, sleeping  Data Reviewed: Basic Metabolic Panel:  Recent Labs Lab 12/11/14 0230 12/14/14 0558  NA 140 139  K 3.9 3.9  CL 102 102  CO2 27 27  GLUCOSE 86 82  BUN 8 9  CREATININE 0.77 0.71  CALCIUM 9.5 9.4   Liver Function Tests:  Recent Labs Lab 12/11/14 0230  AST 32  ALT 28  ALKPHOS 71  BILITOT 0.8  PROT 6.9  ALBUMIN 4.2   CBC:  Recent Labs Lab 12/11/14 0230 12/14/14 0558  WBC 7.6 7.8  NEUTROABS 3.3  --   HGB 11.7* 12.0  HCT 35.2* 36.5  MCV 97.5 97.3  PLT 422* 412*   Cardiac Enzymes: No results for input(s): CKTOTAL, CKMB, CKMBINDEX, TROPONINI in the last 168 hours. CBG:  Recent Labs Lab 12/10/14 2113  GLUCAP 100*   Scheduled Meds: . FLUoxetine  60 mg Oral Daily  . gabapentin  300 mg Oral TID  . heparin  5,000 Units Subcutaneous 3 times per day  .  levETIRAcetam  500 mg Oral BID  . lisinopril  20 mg Oral Daily  . pantoprazole  40 mg Oral BID  . sodium chloride  3 mL Intravenous Q12H   Continuous Infusions:    Marzetta Board, MD [facility] Pager [phone]. If 7 PM - 7 AM, please contact night-coverage at [email or web address], password [email or web address] 12/16/2014, 11:21 AM  LOS: 5 days

## 2014-12-16 NOTE — Progress Notes (Signed)
Pt began seizure like symptoms at 1905.  Not responsive.  Vitals stable.  Ativan 0.5 mg given IM per order.  See MAR and flowsheet for more details.  MD notified.  Baltazar Najjar, NP called back at [place].  Orders to continue to monitor.  Do not give any more ativan 0.5 mg.  She stated she will visit patient soon.  Sitter at bedside.  Pt still in seizure state at [place].  Pt breathing adequately.  Report given to night RN Tom.  Iantha Fallen 12/16/2014 1930.

## 2014-12-16 NOTE — Progress Notes (Signed)
CSW met with Pt along with psych MD. Pt continues to endorse SI, stating that she would prefer not to "be here" at all, referring to being on earth. Pt also verbalized that she agreed with her sister who, per Pt, has stated previously, "I wish you (Pt) would do it (suicide) right once so we could all move past this." Pt stated, "I'd rather jump out of that window than stay here another day."    Pt is agreeable to inpatient psychiatric treatment. She continues to present with a flat affect, depressed mood, and restless psychomotor activity. Pt is reporting increased anxiety and continued feelings of hopelessness and helplessness.  CSW will continue to follow and seek inpatient placement. Psych CSW to continue to monitor and manage medications.   Peri Maris, LCSWA 12/16/2014 1:52 PM

## 2014-12-16 NOTE — Progress Notes (Signed)
Patient had seizure lasted 10 min. Ativan 0.5 mg given. Patient alert to name time and place. Patient c/o headache. PRN pain med given.

## 2014-12-16 NOTE — Progress Notes (Signed)
Patient had seizure that lasted 10 minutes. Ativan 0.5 mg given with good results. Patient alert to name, year and place. Provider notified. Patient c/o headache and given pain med.

## 2014-12-16 NOTE — Progress Notes (Addendum)
This NP paged shortly after shift change re: pt having seizure like activity. NP reviewed chart. Pt has been seen by psych and neurology and so far, no evidence of true epilepsy. Pt has had ativan. Is in no respiratory distress per RN and VS are stable. NP to in to see pt. Pt is lying with left side down in fetal position. Arms drawn up to chest and are rigid. Shaking/twitching of various extremities at random times. No syncopated shaking noted of extremities, not continuous. No head shaking at all. PERRLA. Pt doesn't respond to name calling. Will not talk or open eyes. Withdraws to pain. Protecting airway. Vitals are stable. O2 sat normal and no evidence of respiratory distress. Has had 1.5mg  Ativan IM. No further action is needed at this time. Do not suspect epilepsy/status epilepticus. Hailey Boll, NP [facility] Update: Later, paged by RN, [name]. Pt up in chair. No seizure like activity. Asking to leave AMA. Pt with IVC papers. Verified papers with RN, on chart.  KJKG, NP

## 2014-12-16 NOTE — Progress Notes (Signed)
Per [place] intake staff, Pt remains on waitlist.  Per Cedric at [facility], they do not have the referral. However, he reports that due to Pt medical acuity, they would likely not accept the Pt as their facility is not medically equipped to deal with Pt symptomology.  [name] at [facility] and [name] [facility] [place] reported that they do not have the referral. CSW to refax to those facilities.  CSW also faxed referral to [facility] med psych unit, however staff there report that they are unsure if there will be discharges.   Peri Maris, [place] 12/16/2014 2:01 PM

## 2014-12-16 NOTE — Progress Notes (Signed)
CSW also faxed Pt referral to [facility] and [facility]. CSW to follow up.  Peri Maris, LCSWA 12/16/2014 4:18 PM [phone]

## 2014-12-17 DIAGNOSIS — F319 Bipolar disorder, unspecified: Secondary | ICD-10-CM | POA: Diagnosis not present

## 2014-12-17 DIAGNOSIS — F3163 Bipolar disorder, current episode mixed, severe, without psychotic features: Secondary | ICD-10-CM | POA: Diagnosis not present

## 2014-12-17 DIAGNOSIS — F419 Anxiety disorder, unspecified: Secondary | ICD-10-CM | POA: Diagnosis not present

## 2014-12-17 DIAGNOSIS — R45851 Suicidal ideations: Secondary | ICD-10-CM | POA: Diagnosis not present

## 2014-12-17 DIAGNOSIS — T71162D Asphyxiation due to hanging, intentional self-harm, subsequent encounter: Secondary | ICD-10-CM | POA: Diagnosis not present

## 2014-12-17 LAB — COMPREHENSIVE METABOLIC PANEL
ALT: 22 U/L (ref 0–35)
AST: 24 U/L (ref 0–37)
Albumin: 4.4 g/dL (ref 3.5–5.2)
Alkaline Phosphatase: 67 U/L (ref 39–117)
Anion gap: 11 (ref 5–15)
BILIRUBIN TOTAL: 0.2 mg/dL — AB (ref 0.3–1.2)
BUN: 10 mg/dL (ref 6–23)
CO2: 30 mmol/L (ref 19–32)
Calcium: 9.6 mg/dL (ref 8.4–10.5)
Chloride: 98 mmol/L (ref 96–112)
Creatinine, Ser: 0.78 mg/dL (ref 0.50–1.10)
GFR calc Af Amer: >90 mL/min (ref >90)
GLUCOSE: 67 mg/dL — AB (ref 70–99)
POTASSIUM: 3.7 mmol/L (ref 3.5–5.1)
SODIUM: 139 mmol/L (ref 135–145)
TOTAL PROTEIN: 7.2 g/dL (ref 6.0–8.3)

## 2014-12-17 MED ORDER — BUTALBITAL-APAP-CAFFEINE 50-325-40 MG PO TABS
1.0000 | ORAL_TABLET | Freq: Four times a day (QID) | ORAL | Status: DC | PRN
  Administered 2014-12-17: 1 via ORAL
  Filled 2014-12-17: qty 1

## 2014-12-17 MED ORDER — FLUOXETINE HCL 20 MG PO CAPS: 80.0000 mg | ORAL_CAPSULE | Freq: Every day | ORAL | Status: DC

## 2014-12-17 NOTE — Progress Notes (Addendum)
Chaplain provided support at bedside.  Familiar with pt from previous admissions at [facility].    Danell was lying in bed with depressed / flat affect.  Described feeling fearful / trapped and having lost all hope.  Described at several times throughout conversation ongoing SI, and at one point stated that she spends a lot of time "plotting."  When chaplain inquired, she did not state specific plan except to say "it would not be that hard."    Joie finds the medical facility difficult, as she does not feel she is getting the same care she was at [facility].  She sates "it feels like people don't care."  Chaplain processed this with her and helped to orient toward the goals of medical admission.    Provided ongoing spiritual support around loss of hope, anticipatory grief around mother's illness, financial stress.   Sadee wished to contact at pastor at [facility].  Chaplain called with pt's permission, but pastor is out of office until Monday.  Carollyn did not wish to reveal to anyone else that she is in hospital.   Mahasin reported that she is missing her wallet and phone.  She has purse with other contents.  Chaplain reported to SW and will follow up with security to determine location of pt's wallet and phone.    Will continue to follow for ongoing support.   [place], [place]
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
## 2014-12-17 NOTE — Discharge Summary (Signed)
Physician Discharge Summary  [name] XNT:[id number] DOB: March 25, 1966 DOA: 12/11/2014  PCP: No PCP Per Patient  Admit date: 12/11/2014 Discharge date: 12/17/2014  Time spent: >30 minutes  Recommendations for Outpatient Follow-up:  1. Discharged to inpatient psychiatric hospital    Discharge Diagnoses:  Principal Problem:   Bipolar 1 disorder, mixed, severe Active Problems:   Suicidal ideation   Seizure-like activity   Suicide attempt by hanging  Discharge Condition: guarded  Diet recommendation: regular  Filed Weights   12/11/14 0150 12/15/14 0500  Weight: 58.06 kg (128 lb) 62.3 kg (137 lb 5.6 oz)    History of present illness:  [name] is a 49 y.o. female with Past medical history of GERD, anxiety, depression, bipolar disorder. The patient is presenting with seizure-like episode. Patient has prior history of seizure and she was treated with Keppra and the past. Patient recently presented with suicidal ideation and Tylenol overdose on 11/25/2014. Patient will discharge to behavioral health. In the [place] patient had a seizure-like activity on 318 at which time she was seen in the ER and it was felt like pseudoseizure and therefore she was sent back to behavioral health. She had another episode on 12/08/2014 at which time again she was transferred to behavioral health as she was able to beredirected during the seizure. Today she had an event at which time she was holding her hands and head shaking and unresponsiveness of the whole body. she was given lorazepam twice control the seizure there was no tongue bite she did not have any urine or bowel incontinence. There was no fall. There has been gradually increasing her dose of gabapentin. She has not been receiving most part are Soma currently in the hospital. She has also been receiving Robaxin with increasing dose as well as Percocet for her to take. No other changes reported. The patient is coming from [facility]. And  at her baseline independent for most of her ADL.  Hospital Course:  Seizure-like activity  - recurrent episodes - neuro consulted, appreciate input, non epileptic event per EEG - EEG was done while she was having a spell, neurology signed off 3/23 - no bowel/bladder incontinence/toungue biting - no post ictal features as patient oriented to time/place/person within few minutes of receiving Ativan - continue Keppra Depression/anxiety/bipolar disorder - multiple suicide attempts with Tylenol being hospitalized here and at [facility] - psychiatry consulted and recommended long term inpatient psychiatry - patient continued to be suicidal and tried to choke herself with bed sheets while in acute psychiatric hospital  - continue her current medication regimen, suicide precautions/sitter GERD - continue PPI HTN - continue Lisinopril  Procedures:  None    Consultations:  Psychiatry  Discharge Exam: Filed Vitals:   12/16/14 1611 12/16/14 1917 12/17/14 0552 12/17/14 1327  BP: 118/68 125/89 102/56 123/73  Pulse: 65 115 72 71  Temp: 98 F (36.7 C) 98.8 F (37.1 C) 97.6 F (36.4 C) 97.8 F (36.6 C)  TempSrc: Oral Axillary Oral Axillary  Resp: 18  18 20   Height:      Weight:      SpO2: 98% 94% 98% 100%   General: NAD Cardiovascular: RRR Respiratory: CTA biL  Discharge Instructions    Medication List    STOP taking these medications        busPIRone 7.5 MG tablet  Commonly known as:  BUSPAR      TAKE these medications        CALCIUM-VITAMIN D3 PO  Take 1 capsule by  mouth daily.     carisoprodol 350 MG tablet  Commonly known as:  SOMA  Take 1 tablet (350 mg total) by mouth at bedtime.     clonazePAM 1 MG tablet  Commonly known as:  KLONOPIN  Take 1 tablet (1 mg total) by mouth 3 (three) times daily as needed (anxiety).     diphenhydrAMINE 25 MG tablet  Commonly known as:  BENADRYL  Take 25 mg by mouth every 6 (six) hours as needed for itching or allergies.      FLUoxetine 20 MG capsule  Commonly known as:  PROZAC  Take 4 capsules (80 mg total) by mouth daily.     gabapentin 100 MG capsule  Commonly known as:  NEURONTIN  Take 1 capsule (100 mg total) by mouth 3 (three) times daily.     hydrOXYzine 25 MG tablet  Commonly known as:  ATARAX/VISTARIL  Take 25 mg by mouth every 6 (six) hours as needed for itching.     isometheptene-acetaminophen-dichloralphenazone 65-325-100 MG capsule  Commonly known as:  MIDRIN  Take 1 capsule by mouth every 6 (six) hours as needed for migraine. Maximum 5 capsules in 12 hours for migraine headaches, 8 capsules in 24 hours for tension headaches.     levETIRAcetam 500 MG tablet  Commonly known as:  KEPPRA  Take 1 tablet (500 mg total) by mouth 2 (two) times daily.     lisinopril 20 MG tablet  Commonly known as:  PRINIVIL,ZESTRIL  Take 20 mg by mouth daily.     methocarbamol 750 MG tablet  Commonly known as:  ROBAXIN  Take 750 mg by mouth every 8 (eight) hours as needed for muscle spasms.     multivitamin with minerals Tabs tablet  Take 1 tablet by mouth daily.     pantoprazole 40 MG tablet  Commonly known as:  PROTONIX  Take 1 tablet (40 mg total) by mouth 2 (two) times daily.        The results of significant diagnostics from this hospitalization (including imaging, microbiology, ancillary and laboratory) are listed below for reference.    Significant Diagnostic Studies: Ct Head Wo Contrast  12/10/2014   CLINICAL DATA:  Multiple seizures today.  EXAM: CT HEAD WITHOUT CONTRAST  TECHNIQUE: Contiguous axial images were obtained from the base of the skull through the vertex without intravenous contrast.  COMPARISON:  Head CT 11/25/14  FINDINGS: No intracranial hemorrhage, mass effect, or midline shift. No hydrocephalus. The basilar cisterns are patent. No evidence of territorial infarct. No intracranial fluid collection. Previous left frontal scalp contusion has resolved. Calvarium is intact. Included  paranasal sinuses and mastoid air cells are well aerated.  IMPRESSION: No acute intracranial abnormality.   Electronically Signed   By: Jeb Levering M.D.   On: 12/10/2014 23:59   Ct Head Wo Contrast  11/25/2014   CLINICAL DATA:  Status post fall the night of 11/24/2014 with a blow to the right side of the head. Vomiting.  EXAM: CT HEAD WITHOUT CONTRAST  TECHNIQUE: Contiguous axial images were obtained from the base of the skull through the vertex without intravenous contrast.  COMPARISON:  Head CT scan 04/07/2014 and brain MRI 03/20/2014.  FINDINGS: The brain appears normal without hemorrhage, infarct, mass lesion, mass effect, midline shift or abnormal extra-axial fluid collection. Small appearing contusion over the frontal bone on the left is noted. No acute fracture is identified. Remote fracture of the lateral wall left orbit is again seen. There is no hydrocephalus or pneumocephalus. Imaged  paranasal sinuses and mastoid air cells are clear.  IMPRESSION: Soft tissue contusion over the left frontal bone without underlying fracture or acute intracranial abnormality.  Remote fractures of the lateral wall of the left orbit and left zygomatic arch.   Electronically Signed   By: Inge Rise M.D.   On: 11/25/2014 20:17   Dg Chest Port 1 View  11/25/2014   CLINICAL DATA:  Chest pain today. Migraines. History of alcohol abuse, depression, anxiety, seizures.  EXAM: PORTABLE CHEST - 1 VIEW  COMPARISON:  10/05/2014  FINDINGS: The heart size and mediastinal contours are within normal limits. Both lungs are clear. The visualized skeletal structures are unremarkable.  IMPRESSION: No active disease.   Electronically Signed   By: Lucienne Capers M.D.   On: 11/25/2014 22:16   Labs: Basic Metabolic Panel:  Recent Labs Lab 12/11/14 0230 12/14/14 0558 12/17/14 0543  NA 140 139 139  K 3.9 3.9 3.7  CL 102 102 98  CO2 27 27 30   GLUCOSE 86 82 67*  BUN 8 9 10   CREATININE 0.77 0.71 0.78  CALCIUM 9.5 9.4 9.6     Liver Function Tests:  Recent Labs Lab 12/11/14 0230 12/17/14 0543  AST 32 24  ALT 28 22  ALKPHOS 71 67  BILITOT 0.8 0.2*  PROT 6.9 7.2  ALBUMIN 4.2 4.4   CBC:  Recent Labs Lab 12/11/14 0230 12/14/14 0558  WBC 7.6 7.8  NEUTROABS 3.3  --   HGB 11.7* 12.0  HCT 35.2* 36.5  MCV 97.5 97.3  PLT 422* 412*    CBG:  Recent Labs Lab 12/10/14 2113  GLUCAP 100*       Signed:  GHERGHE, COSTIN  [facility] 12/17/2014, 1:33 PM

## 2014-12-17 NOTE — Progress Notes (Signed)
CSW met with Pt along with psych MD. Pt continues to voice suicidal ideation with plan to jump out of her hospital room window. Pt also continues to describe her wish not "to be here" indicating living on earth. Pt has difficulty identifying triggers to her onset of SI when she arrived at [name] for seizure-like activity. Pt expressed that she feels that her depression and anxiety are worsening and are not being adequately managed. When discussing her suicide attempt at [facility], [place] reports that she asked three people to sit down and talk to her but they all reported that they were busy; Pt reported that she went into her room and had a breakdown which is when she chose to tie the bed sheet around her neck. Pt also expressed frustration that all of her belongings had been taken out of her room by nursing staff as Pt reports feeling that she has proven that she is safe. Psych MD explained that based on Pt history and continuing endorsement of SI, safety precautions were necessary.   CSW spoke with Angie in admissions at [facility] who reported that they were having trouble getting in contact with the insurance company, but they are still considering Pt for admission. Angie reports that she will call when they have more information. CSW to carry phone in case [facility] calls with admission decision.   Peri Maris, [place] 12/17/2014 2:04 PM [phone]

## 2014-12-17 NOTE — Progress Notes (Addendum)
Pt reported missing wallet and cell phone.  Chaplain with [place] and security looking for pt belongings at [facility].  Pt's locker (28) opened by security and was empty.  Notes indicate pt's belongings taken by security to [facility] hospital on 3/23.  Note was not specific about content of belongings.  Initial intake scanned into "media" in EP[facility] details pt possessions and indicates "purse" but does not show wallet or phone.   Met with De[place] for continued support.  She states her wallet is at home.  Continues to feel as though she brought her cell phone to hospital, but reports arriving on ambulance and not knowing what might have been with her.   Chaplain will check with security in emergency department to ensure cell phone was not misplaced during transition from ambulance to ED.    [name] presents with depressed and flat affect.  Continued frustration around dynamics of care at medical facility.  Describes waiting extended time for med request.  Chaplain provided empathic presence, normalized response.  [name] has spoken with Therapist, sports.  Wishes Chaplain to contact [place] and request minister come to hospital for support as well.

## 2014-12-17 NOTE — Progress Notes (Signed)
Per Gae Bon at [facility], [place] remains on [facility] waitlist. CSW asked if they needed any updated notes and Gae Bon reported that none were needed at this time. CSW will attempt to call [facility] admissions nurse later in the day to discuss Pt's case.  Per Natale Milch at [employer], Pt was declined for inpatient psychiatric admission due to medical acuity related to seizure-like activity.  Pt still under review at [facility] admissions reported that they did not have the referral that was sent yesterday. CSW refaxed referral at [place].  Will continue to seek inpatient placement.   Peri Maris, [place] 12/17/2014 8:20 AM [phone]

## 2014-12-18 DIAGNOSIS — R45851 Suicidal ideations: Secondary | ICD-10-CM | POA: Diagnosis not present

## 2014-12-18 DIAGNOSIS — R569 Unspecified convulsions: Secondary | ICD-10-CM | POA: Diagnosis not present

## 2014-12-18 DIAGNOSIS — T71162S Asphyxiation due to hanging, intentional self-harm, sequela: Secondary | ICD-10-CM

## 2014-12-18 DIAGNOSIS — F3163 Bipolar disorder, current episode mixed, severe, without psychotic features: Secondary | ICD-10-CM | POA: Diagnosis not present

## 2014-12-18 DIAGNOSIS — F319 Bipolar disorder, unspecified: Secondary | ICD-10-CM | POA: Diagnosis not present

## 2014-12-18 MED ORDER — BUTALBITAL-APAP-CAFFEINE 50-325-40 MG PO TABS: 1.0000 | ORAL_TABLET | Freq: Four times a day (QID) | ORAL | Status: DC | PRN

## 2014-12-18 NOTE — Progress Notes (Signed)
Per Nicole Kindred at [facility], [place] has been accepted by Dr. Jonelle Sports and can come when transportation is ready. CSW faxed copy of Pt IVC paperwork to [facility] [phone]). Number to call report is [phone].  CSW notified Pt of acceptance at [facility]. Pt reported that she was not going to go. CSW informed her that her IVC was in effect until Friday April 1st and due to safety concerns the standing IVC did not allow her to refuse to go. Pt reported that she was never directly served IVC paperwork. CSW provided Pt with a copy of her IVC paperwork and the number to the magistrate's office as Pt requested.   CSW spoke with Sgt Pascal from [place] department to schedule transportation. Sgt Pascal reported that an officer would be there by 11:00am unless something happened. CSW provided [employer] with numbers to nursing stating and writer's phone.   CSW signing off but available if needs arise.  Peri Maris, [place] 12/18/2014 10:18 AM [phone]

## 2014-12-18 NOTE — Progress Notes (Signed)
Patient being discharged to [facility] called,and given to [facility]. Vitals sign stable. Transported by Sheriff's  Department to awaiting facility.

## 2014-12-18 NOTE — Progress Notes (Signed)
PROGRESS NOTE  [name] HER:[id number] DOB: Feb 07, 1966 DOA: 12/11/2014 PCP: No PCP Per Patient  HPI: [name] is a 49 y.o. female with Past medical history of GERD, anxiety, depression, bipolar disorder and recurrent suicide attempts. The patient is presenting with seizure-like episode from [facility]. Patient currently under involuntary commitment for suicidal ideations. Plan for her to be discharged back to inpatient psychiatric facility. During this hospitalization she was seen and evaluated by psychiatry recommending inpatient psychiatric hospitalization. Patient voicing her disapproval of this decision.  Subjective / 24 H Interval events -She expresses her disapproval for being discharged to inpatient psychiatric facility, I explained that she was under involuntary commitment, requesting to speak to magistrate.  Assessment/Plan: Principal Problem:   Bipolar 1 disorder, mixed, severe Active Problems:   Suicidal ideation   Seizure-like activity   Suicide attempt by hanging  Medical issues stable today, no changes.   Seizure-like activity  - recurrent episodes - neuro consulted, appreciate input, non epileptic event per EEG, neurology signed off 3/23 - no bowel/bladder incontinence/toungue biting - no post ictal features as patient oriented to time/place/person within few minutes of receiving Ativan - continue Keppra  Depression/anxiety/bipolar disorder - multiple suicide attempts with Tylenol being hospitalized here and at [facility] - psychiatry consulted and recommended long term inpatient psychiatry, SW consulted, awaiting bed - Patient to be discharged to [facility] inpatient psychiatric facility today   Diet: Diet regular Diet - low sodium heart healthy Fluids: none DVT Prophylaxis: heparin  Code Status: Full Code Family Communication: none bedside  Disposition Plan: Plan to discharge to inpatient psychiatric facility today  Consultants:  Psychiatry   Neurology    Procedures:  EEG 3/23    Antibiotics  Anti-infectives    None       Studies  No results found.  Objective  Filed Vitals:   12/16/14 1611 12/16/14 1917 12/17/14 0552 12/17/14 1327  BP: 118/68 125/89 102/56 123/73  Pulse: 65 115 72 71  Temp: 98 F (36.7 C) 98.8 F (37.1 C) 97.6 F (36.4 C) 97.8 F (36.6 C)  TempSrc: Oral Axillary Oral Axillary  Resp: 18  18 20   Height:      Weight:      SpO2: 98% 94% 98% 100%    Intake/Output Summary (Last 24 hours) at 12/18/14 1041 Last data filed at 12/17/14 1739  Gross per 24 hour  Intake    480 ml  Output      0 ml  Net    480 ml   Filed Weights   12/11/14 0150 12/15/14 0500  Weight: 58.06 kg (128 lb) 62.3 kg (137 lb 5.6 oz)    Exam:  General:  NAD, sleeping  Data Reviewed: Basic Metabolic Panel:  Recent Labs Lab 12/14/14 0558 12/17/14 0543  NA 139 139  K 3.9 3.7  CL 102 98  CO2 27 30  GLUCOSE 82 67*  BUN 9 10  CREATININE 0.71 0.78  CALCIUM 9.4 9.6   Liver Function Tests:  Recent Labs Lab 12/17/14 0543  AST 24  ALT 22  ALKPHOS 67  BILITOT 0.2*  PROT 7.2  ALBUMIN 4.4   CBC:  Recent Labs Lab 12/14/14 0558  WBC 7.8  HGB 12.0  HCT 36.5  MCV 97.3  PLT 412*   Cardiac Enzymes: No results for input(s): CKTOTAL, CKMB, CKMBINDEX, TROPONINI in the last 168 hours. CBG: No results for input(s): GLUCAP in the last 168 hours. Scheduled Meds: . FLUoxetine  80 mg Oral Daily  . gabapentin  300 mg Oral TID  . heparin  5,000 Units Subcutaneous 3 times per day  . levETIRAcetam  500 mg Oral BID  . lisinopril  20 mg Oral Daily  . pantoprazole  40 mg Oral BID   Continuous Infusions:

## 2015-01-01 NOTE — Discharge Summary (Signed)
  Patient was transferred to [facility]- ED due to  Tonic clonic seizure.  ( 12/10/14) She had had previous seizures and  Report was that she  admitted to  [facility] for ongoing management/ treatment. As such , she was discharged from our unit.

## 2015-01-19 ENCOUNTER — Emergency Department (HOSPITAL_COMMUNITY): Payer: Medicaid Other

## 2015-01-19 ENCOUNTER — Inpatient Hospital Stay (HOSPITAL_COMMUNITY)
Admission: EM | Admit: 2015-01-19 | Discharge: 2015-02-06 | DRG: 917 | Disposition: A | Discharge status: Other Acute Inpatient Hospital | Payer: Medicaid Other | Attending: Internal Medicine | Admitting: Internal Medicine

## 2015-01-19 ENCOUNTER — Encounter (HOSPITAL_COMMUNITY): Payer: Self-pay | Admitting: Neurology

## 2015-01-19 DIAGNOSIS — T391X4A Poisoning by 4-Aminophenol derivatives, undetermined, initial encounter: Secondary | ICD-10-CM

## 2015-01-19 DIAGNOSIS — F329 Major depressive disorder, single episode, unspecified: Secondary | ICD-10-CM | POA: Diagnosis present

## 2015-01-19 DIAGNOSIS — R9431 Abnormal electrocardiogram [ECG] [EKG]: Secondary | ICD-10-CM | POA: Diagnosis present

## 2015-01-19 DIAGNOSIS — F411 Generalized anxiety disorder: Secondary | ICD-10-CM | POA: Diagnosis present

## 2015-01-19 DIAGNOSIS — R4020 Unspecified coma: Secondary | ICD-10-CM | POA: Diagnosis present

## 2015-01-19 DIAGNOSIS — T391X2A Poisoning by 4-Aminophenol derivatives, intentional self-harm, initial encounter: Principal | ICD-10-CM | POA: Diagnosis present

## 2015-01-19 DIAGNOSIS — F319 Bipolar disorder, unspecified: Secondary | ICD-10-CM | POA: Diagnosis present

## 2015-01-19 DIAGNOSIS — R739 Hyperglycemia, unspecified: Secondary | ICD-10-CM | POA: Diagnosis present

## 2015-01-19 DIAGNOSIS — E876 Hypokalemia: Secondary | ICD-10-CM | POA: Diagnosis present

## 2015-01-19 DIAGNOSIS — R109 Unspecified abdominal pain: Secondary | ICD-10-CM | POA: Insufficient documentation

## 2015-01-19 DIAGNOSIS — M47892 Other spondylosis, cervical region: Secondary | ICD-10-CM | POA: Diagnosis present

## 2015-01-19 DIAGNOSIS — E8729 Other acidosis: Secondary | ICD-10-CM | POA: Diagnosis present

## 2015-01-19 DIAGNOSIS — F17210 Nicotine dependence, cigarettes, uncomplicated: Secondary | ICD-10-CM | POA: Diagnosis present

## 2015-01-19 DIAGNOSIS — T428X2A Poisoning by antiparkinsonism drugs and other central muscle-tone depressants, intentional self-harm, initial encounter: Secondary | ICD-10-CM | POA: Diagnosis present

## 2015-01-19 DIAGNOSIS — G4700 Insomnia, unspecified: Secondary | ICD-10-CM | POA: Diagnosis present

## 2015-01-19 DIAGNOSIS — Z9289 Personal history of other medical treatment: Secondary | ICD-10-CM

## 2015-01-19 DIAGNOSIS — R401 Stupor: Secondary | ICD-10-CM

## 2015-01-19 DIAGNOSIS — F332 Major depressive disorder, recurrent severe without psychotic features: Secondary | ICD-10-CM | POA: Diagnosis present

## 2015-01-19 DIAGNOSIS — F32A Depression, unspecified: Secondary | ICD-10-CM | POA: Diagnosis present

## 2015-01-19 DIAGNOSIS — G92 Toxic encephalopathy: Secondary | ICD-10-CM | POA: Diagnosis present

## 2015-01-19 DIAGNOSIS — F1010 Alcohol abuse, uncomplicated: Secondary | ICD-10-CM | POA: Diagnosis present

## 2015-01-19 DIAGNOSIS — T391X1A Poisoning by 4-Aminophenol derivatives, accidental (unintentional), initial encounter: Secondary | ICD-10-CM | POA: Diagnosis present

## 2015-01-19 DIAGNOSIS — J9690 Respiratory failure, unspecified, unspecified whether with hypoxia or hypercapnia: Secondary | ICD-10-CM

## 2015-01-19 DIAGNOSIS — T1491XA Suicide attempt, initial encounter: Secondary | ICD-10-CM | POA: Diagnosis present

## 2015-01-19 DIAGNOSIS — J9600 Acute respiratory failure, unspecified whether with hypoxia or hypercapnia: Secondary | ICD-10-CM | POA: Diagnosis present

## 2015-01-19 DIAGNOSIS — T43011A Poisoning by tricyclic antidepressants, accidental (unintentional), initial encounter: Secondary | ICD-10-CM | POA: Diagnosis present

## 2015-01-19 DIAGNOSIS — F4310 Post-traumatic stress disorder, unspecified: Secondary | ICD-10-CM | POA: Diagnosis present

## 2015-01-19 DIAGNOSIS — E872 Acidosis: Secondary | ICD-10-CM | POA: Diagnosis present

## 2015-01-19 HISTORY — DX: Conversion disorder with seizures or convulsions: F44.5

## 2015-01-19 LAB — COMPREHENSIVE METABOLIC PANEL
ALBUMIN: 3.9 g/dL (ref 3.5–5.0)
ALK PHOS: 89 U/L (ref 38–126)
ALT: 17 U/L (ref 14–54)
ANION GAP: 11 (ref 5–15)
AST: 31 U/L (ref 15–41)
BUN: 10 mg/dL (ref 6–20)
CHLORIDE: 103 mmol/L (ref 101–111)
CO2: 23 mmol/L (ref 22–32)
Calcium: 8.7 mg/dL — ABNORMAL LOW (ref 8.9–10.3)
Creatinine, Ser: 0.72 mg/dL (ref 0.44–1.00)
GFR calc Af Amer: >60 mL/min (ref >60)
GFR calc non Af Amer: >60 mL/min (ref >60)
GLUCOSE: 119 mg/dL — AB (ref 70–99)
POTASSIUM: 3.3 mmol/L — AB (ref 3.5–5.1)
Sodium: 137 mmol/L (ref 135–145)
Total Bilirubin: 0.4 mg/dL (ref 0.3–1.2)
Total Protein: 6.5 g/dL (ref 6.5–8.1)

## 2015-01-19 LAB — I-STAT ARTERIAL BLOOD GAS, ED
Acid-base deficit: 1 mmol/L (ref 0.0–2.0)
Bicarbonate: 26.9 mEq/L — ABNORMAL HIGH (ref 20.0–24.0)
O2 Saturation: 100 %
Patient temperature: 98.6
TCO2: 29 mmol/L (ref 0–100)
pCO2 arterial: 56.9 mmHg — ABNORMAL HIGH (ref 35.0–45.0)
pH, Arterial: 7.283 — ABNORMAL LOW (ref 7.350–7.450)
pO2, Arterial: 360 mmHg — ABNORMAL HIGH (ref 80.0–100.0)

## 2015-01-19 LAB — CBC WITH DIFFERENTIAL/PLATELET
BASOS ABS: 0 10*3/uL (ref 0.0–0.1)
BASOS PCT: 0 % (ref 0–1)
Eosinophils Absolute: 0.3 10*3/uL (ref 0.0–0.7)
Eosinophils Relative: 4 % (ref 0–5)
HCT: 37.3 % (ref 36.0–46.0)
Hemoglobin: 12.2 g/dL (ref 12.0–15.0)
LYMPHS PCT: 58 % — AB (ref 12–46)
Lymphs Abs: 5 10*3/uL — ABNORMAL HIGH (ref 0.7–4.0)
MCH: 31.3 pg (ref 26.0–34.0)
MCHC: 32.7 g/dL (ref 30.0–36.0)
MCV: 95.6 fL (ref 78.0–100.0)
Monocytes Absolute: 0.5 10*3/uL (ref 0.1–1.0)
Monocytes Relative: 6 % (ref 3–12)
NEUTROS PCT: 32 % — AB (ref 43–77)
Neutro Abs: 2.7 10*3/uL (ref 1.7–7.7)
Platelets: 249 10*3/uL (ref 150–400)
RBC: 3.9 MIL/uL (ref 3.87–5.11)
RDW: 12.7 % (ref 11.5–15.5)
WBC: 8.6 10*3/uL (ref 4.0–10.5)

## 2015-01-19 LAB — I-STAT CG4 LACTIC ACID, ED: Lactic Acid, Venous: 2.75 mmol/L (ref 0.5–2.0)

## 2015-01-19 LAB — I-STAT CHEM 8, ED
BUN: 11 mg/dL (ref 6–20)
CHLORIDE: 103 mmol/L (ref 101–111)
Calcium, Ion: 1.1 mmol/L — ABNORMAL LOW (ref 1.12–1.23)
Creatinine, Ser: 0.7 mg/dL (ref 0.44–1.00)
Glucose, Bld: 119 mg/dL — ABNORMAL HIGH (ref 70–99)
HCT: 40 % (ref 36.0–46.0)
Hemoglobin: 13.6 g/dL (ref 12.0–15.0)
Potassium: 3.3 mmol/L — ABNORMAL LOW (ref 3.5–5.1)
SODIUM: 140 mmol/L (ref 135–145)
TCO2: 21 mmol/L (ref 0–100)

## 2015-01-19 LAB — ACETAMINOPHEN LEVEL: ACETAMINOPHEN (TYLENOL), SERUM: 140 ug/mL — AB (ref 10–30)

## 2015-01-19 LAB — ETHANOL: Alcohol, Ethyl (B): <5 mg/dL (ref <5)

## 2015-01-19 LAB — AMMONIA: Ammonia: 27 umol/L (ref 9–35)

## 2015-01-19 LAB — SALICYLATE LEVEL

## 2015-01-19 MED ORDER — ETOMIDATE 2 MG/ML IV SOLN
INTRAVENOUS | Status: AC | PRN
  Administered 2015-01-19: 20 mg via INTRAVENOUS

## 2015-01-19 MED ORDER — ROCURONIUM BROMIDE 50 MG/5ML IV SOLN
INTRAVENOUS | Status: AC | PRN
  Administered 2015-01-19: 100 mg via INTRAVENOUS

## 2015-01-19 MED ORDER — ROCURONIUM BROMIDE 50 MG/5ML IV SOLN
INTRAVENOUS | Status: AC
  Filled 2015-01-19: qty 2

## 2015-01-19 MED ORDER — NALOXONE HCL 1 MG/ML IJ SOLN
INTRAMUSCULAR | Status: AC
  Administered 2015-01-19: 2 mg
  Filled 2015-01-19: qty 2

## 2015-01-19 MED ORDER — PROPOFOL 1000 MG/100ML IV EMUL
INTRAVENOUS | Status: AC
  Filled 2015-01-19: qty 100

## 2015-01-19 MED ORDER — NALOXONE HCL 0.4 MG/ML IJ SOLN: 0.4000 mg | Freq: Once | INTRAMUSCULAR | Status: AC

## 2015-01-19 MED ORDER — ETOMIDATE 2 MG/ML IV SOLN
INTRAVENOUS | Status: AC
Start: 2015-01-19 — End: 2015-01-20
  Filled 2015-01-19: qty 20

## 2015-01-19 MED ORDER — SUCCINYLCHOLINE CHLORIDE 20 MG/ML IJ SOLN
INTRAMUSCULAR | Status: AC
  Filled 2015-01-19: qty 1

## 2015-01-19 MED ORDER — PROPOFOL 1000 MG/100ML IV EMUL
INTRAVENOUS | Status: DC | PRN
  Administered 2015-01-19: 50 ug/kg/min via INTRAVENOUS

## 2015-01-19 MED ORDER — LIDOCAINE HCL (CARDIAC) 20 MG/ML IV SOLN
INTRAVENOUS | Status: AC
  Filled 2015-01-19: qty 5

## 2015-01-19 NOTE — ED Notes (Signed)
No response to narcan

## 2015-01-19 NOTE — Consult Note (Signed)
PATIENT NAME:  [name], [name] MR#:  [id number] DATE OF BIRTH:  25-Aug-1966  DATE OF CONSULTATION:  10/31/2014  REFERRING PHYSICIAN:   CONSULTING PHYSICIAN:  Gonzella Lex, MD  IDENTIFYING INFORMATION AND REASON FOR CONSULTATION: A 49 year old woman with a history of recurrent severe major depression with a request to assess her for ECT treatment.   HISTORY OF PRESENT ILLNESS: Information obtained from the patient, from the chart, and from conversation with Dr. Jerilee Hoh. This 49 year old woman was referred to our hospital from [facility] where she has spent a few days in the critical care unit recovering from an overdose of acetaminophen. The patient reports that she had seriously been intending to die when she took an overdose of acetaminophen and indeed she took enough to do some significant liver damage, although evidently not enough to require re-referral to a transplant team. The patient reports that her mood continues to be very depressed. She feels hopeless about her life. Feels like she has no reason to live at all. Her sleep is adequate only with the medication that she takes at night. Appetite appears to be adequate. She denies having auditory or visual hallucinations and does not appear to be obviously paranoid. Feels overwhelmed by stress, especially by anxiety of social interaction and also by the stress of her mother's chronic illness. The patient is not currently seeing anyone for outpatient psychiatric treatment. If she is to be believed she has had several hospitalizations with serious suicide attempts over the last several months and yet has not been referred for outpatient treatment. She had been taking fluoxetine somehow and this has recently been increased. Has been on the fluoxetine for about 3 weeks, does not feel like it has been of any benefit. Denies the abuse of alcohol or recreational drugs. The patient herself brought up the possibility of ECT with her psychiatrist on the unit  because she had been referred for ECT apparently several years ago.   PAST PSYCHIATRIC HISTORY: The patient tells me that she has had depression "all of her life." It has been bad ever since she was in her 35s. She has had some periods of improvement, but especially over the last several months it seems that it has been out of control. She has evidently tried to kill herself by acetaminophen overdose 2-3 times just in the last few months. She has had past treatments with other antidepressant, she recalls Paxil and Effexor as being ineffective. She had also been treated with Trileptal in the past as a possible treatment for her mood. She was of the opinion that that was helpful, although obviously it was not good enough to keep her from making serious suicide attempts, which she acknowledges. The patient has never had ECT in the past. Has never had transcranial stimulation treatment.  SOCIAL HISTORY: She lives by herself. 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 Not married. She previously had taken care of her mother, but her mother no longer lives with her. The patient indicates that she is very socially isolated. Does not even feel comfortable leaving her house most of the time. Very significant in that transportation seems to be a severe impediment. Additionally the patient has no insurance coverage whatsoever.   FAMILY HISTORY: Positive for depression.   PAST MEDICAL HISTORY: Multiple medical problems. She has chronic pain mostly in her back and had been a patient at a chronic pain clinic where she was taking chronic narcotics.  She says she has had multiple back injuries in the past. Additionally she has some kind of past history of seizures. As she describes it to me she had a seizure this summer in June and another one in July. It is not sure what the cause of those might have been, it does not appear that there was a specific etiology discovered.  Nevertheless she was put on Keppra and has continued on it up until the present day without any repeat of any seizures. Her overdoses with acetaminophen have caused serious liver damage although she was evaluated by the transplant team at [facility] and evidently was not felt to be at a point where she would actually need a liver transplant.   SUBSTANCE ABUSE HISTORY: Denies any alcohol or drug abuse.   CURRENT MEDICATIONS: Here on the unit she is currently getting Soma 350 mg at night, clonazepam 0.5 mg 3 times a day, Neurontin 300 mg 3 times a day, Vistaril 50 mg at night as needed for insomnia, Keppra 500 mg q. 12 hours, pantoprazole 40 mg twice a day, meloxicam 15 mg a day, fluoxetine 40 mg a day.   ALLERGIES: MULTIPLE, INCLUDING AMBIEN, ASPIRIN, LEVAQUIN, LIDOCAINE, LUNESTA, NONSTEROIDAL MEDICINES, PROCHLORPERAZINE, SULFA DRUGS, LATEX.   REVIEW OF SYSTEMS: Depressed mood. Hopelessness. Suicidal ideation and wish to die. No hallucinations. No specific physical complaints other than chronic back pain.   MENTAL STATUS EXAMINATION: Slightly disheveled woman, looks her stated age, cooperative with the interview. Good eye contact. Normal psychomotor activity. Forthcoming and appropriate in the interview. Speech normal rate, tone, and volume. Affect reactive, only mildly depressed seeming. Mood stated as being depressed. Thoughts lucid without loosening of associations or delusions. No evidence of auditory or visual hallucinations. Endorses ongoing suicidal ideation and hopelessness. No homicidal ideation. She is alert and oriented x 4. Judgment and insight appears to be adequate currently. Short and long-term memory intact to testing. Intelligence normal.   LABORATORY RESULTS: Chemistry panel done today shows elevated alkaline phosphatase at 140, elevated ALT at 354, elevated AST 52. TSH is normal at 48. Ammonia level not completed. CBC, elevated platelet count 474,000, otherwise unremarkable. Vitamin B12 level  is normal.   VITAL SIGNS: Blood pressure 113/79, respirations 20, pulse 90, temperature 98.1.   ASSESSMENT: This is a 49 year old woman with severe major depression with several remarkable suicide attempts just in the last few months. She is continuing to endorse depression and hopelessness. Does not appear to be psychotic. On the basis of her having major depression and not having responded to antidepressant medication she would be an appropriate candidate for electroconvulsive therapy. From a practical standpoint some obstacles would include the fact that she lacks any transportation that would allow for outpatient treatment and furthermore lacks any insurance coverage that would allow for outpatient maintenance electroconvulsive therapy. Therefore any electroconvulsive therapy treatment plan would have to presume hospitalization through the entire course. Another obstacle is the fact that she is currently taking Keppra. It would not be completely impossible to do electroconvulsive therapy while continuing this medicine, but it would be more ideal to be off of it if possible.   TREATMENT PLAN: The patient was advised of everything I mentioned above. She states that she would like to pursue ECT even if she thinks it would only be a transient improvement in her mood. At this point my plan will be to request a neurology consult to see if they think it would be safe to discontinue the Keppra or if we really  need to continue that. I am going to go ahead and order the EKG and chest x-ray and urinalysis that we need to complete our laboratory workup.  I am going to make sure that we repeat the liver function tests to see if things are getting better with that. If the patient continues to want ECT tomorrow we can have her sign consent and potentially plan to begin treatment on Monday.   DIAGNOSIS PRINCIPAL AND PRIMARY:   AXIS I: Major depression, severe, recurrent.   ____________________________ Gonzella Lex, MD jtc:bu D: 10/31/2014 19:01:16 ET T: 10/31/2014 20:26:40 ET JOB#: [id number]  cc: Gonzella Lex, MD, <Dictator> Gonzella Lex MD ELECTRONICALLY SIGNED 11/04/2014 10:44

## 2015-01-19 NOTE — ED Provider Notes (Signed)
CSN: [id number]     Arrival date & time 01/19/15  2231 History   First MD Initiated Contact with Patient 01/19/15 2243     Chief Complaint  Patient presents with  . Drug Overdose     (Consider location/radiation/quality/duration/timing/severity/associated sxs/prior Treatment) The history is provided by medical records. The history is limited by the condition of the patient. No language interpreter was used.    [name] is a 49 y.o. female presents to the emergency department after being found unresponsive in her vehicle in the emergency room parking lot. Her [employer] there is heroin in her car, no fresh track marks noted on her arms.  On her person is discharge paperwork from [facility] from 12/30/2014. Patient with a history of bipolar disorder, PTSD, EtOH abuse, Tylenol overdose, hypertension.  Record review shows history of pseudoseizure with normal EEGs. Patient also with history of suicide attempt via overdose.  Level 5 caveat for acuity of condition.  .   Past Medical History  Diagnosis Date  . Migraines   . Endometriosis   . ETOH abuse     sober 3 1/2 years  . Anorexia   . Depression   . Anxiety   . DJD (degenerative joint disease) of cervical spine   . Psychiatric pseudoseizure   . PICC (peripherally inserted central catheter) in place     rt neck   Past Surgical History  Procedure Laterality Date  . Knee surgery    . Abdominal surgery    . Cholecystectomy    . Appendectomy    . Abdominal hysterectomy    . Nasal sinus surgery    . Laproscopy    . Carpal tunnel release      2010  . Esophagogastroduodenoscopy N/A 10/06/2014    Procedure: ESOPHAGOGASTRODUODENOSCOPY (EGD);  Surgeon: Inda Castle, MD;  Location: [facility];  Service: Endoscopy;  Laterality: N/A;   Family History  Problem Relation Age of Onset  . Hypertension Mother   . Hyperlipidemia Mother   . Heart failure Father   . Cancer Other    History  Substance Use Topics  .  Smoking status: Current Every Day Smoker -- 0.25 packs/day for 20 years    Types: Cigarettes  . Smokeless tobacco: Never Used  . Alcohol Use: No   OB History    No data available     Review of Systems  Unable to perform ROS: Acuity of condition      Allergies  Aspirin; Doxycycline; Imitrex; Iodinated diagnostic agents; Lidocaine; Prednisone; Sulfa antibiotics; Sulfasalazine; Sumatriptan; Tramadol; Levofloxacin; Latex; Levofloxacin; Metrizamide; Nsaids; and Tolmetin  Home Medications   Prior to Admission medications   Medication Sig Start Date End Date Taking? Authorizing Provider  butalbital-acetaminophen-caffeine (FIORICET, ESGIC) 50-325-40 MG per tablet Take 1 tablet by mouth every 6 (six) hours as needed for headache. 12/18/14   Kelvin Cellar, MD  Calcium Carbonate-Vitamin D (CALCIUM-VITAMIN D3 PO) Take 1 capsule by mouth daily.    Historical Provider, MD  carisoprodol (SOMA) 350 MG tablet Take 1 tablet (350 mg total) by mouth at bedtime. Patient taking differently: Take 350 mg by mouth 2 (two) times daily.  10/19/14   Knox Royalty, NP  clonazePAM (KLONOPIN) 1 MG tablet Take 1 tablet (1 mg total) by mouth 3 (three) times daily as needed (anxiety). 10/19/14   Knox Royalty, NP  diphenhydrAMINE (BENADRYL) 25 MG tablet Take 25 mg by mouth every 6 (six) hours as needed for itching or allergies.  Historical Provider, MD  FLUoxetine (PROZAC) 20 MG capsule Take 4 capsules (80 mg total) by mouth daily. 12/17/14   Costin Karlyne Greenspan, MD  gabapentin (NEURONTIN) 100 MG capsule Take 1 capsule (100 mg total) by mouth 3 (three) times daily. Patient taking differently: Take 300 mg by mouth 3 (three) times daily.  10/19/14   Knox Royalty, NP  hydrOXYzine (ATARAX/VISTARIL) 25 MG tablet Take 25 mg by mouth every 6 (six) hours as needed for itching.    Historical Provider, MD  isometheptene-acetaminophen-dichloralphenazone (MIDRIN) 65-325-100 MG capsule Take 1 capsule by mouth every 6 (six) hours as  needed for migraine. Maximum 5 capsules in 12 hours for migraine headaches, 8 capsules in 24 hours for tension headaches.    Historical Provider, MD  levETIRAcetam (KEPPRA) 500 MG tablet Take 1 tablet (500 mg total) by mouth 2 (two) times daily. 10/19/14   Knox Royalty, NP  lisinopril (PRINIVIL,ZESTRIL) 20 MG tablet Take 20 mg by mouth daily.    Historical Provider, MD  methocarbamol (ROBAXIN) 750 MG tablet Take 750 mg by mouth every 8 (eight) hours as needed for muscle spasms.    Historical Provider, MD  Multiple Vitamin (MULTIVITAMIN WITH MINERALS) TABS tablet Take 1 tablet by mouth daily.    Historical Provider, MD  pantoprazole (PROTONIX) 40 MG tablet Take 1 tablet (40 mg total) by mouth 2 (two) times daily. 10/19/14   Knox Royalty, NP   BP 114/66 mmHg  Pulse 122  Resp 20  Wt 137 lb 5.6 oz (62.3 kg)  SpO2 98% Physical Exam  Constitutional: She appears well-developed and well-nourished.  HENT:  Head: Normocephalic and atraumatic.  Nose: Nose normal.  Eyes: Conjunctivae are normal. No scleral icterus.  Pupils equal, round and sluggish; not pinpoint  Neck: Neck supple.  Cardiovascular: S1 normal, S2 normal, normal heart sounds and intact distal pulses.  Tachycardia present.  Exam reveals no gallop.   No murmur heard. Pulses:      Radial pulses are 2+ on the right side, and 2+ on the left side.       Dorsalis pedis pulses are 2+ on the right side, and 2+ on the left side.  Pulmonary/Chest: Breath sounds normal. No respiratory distress. She has no wheezes. She has no rales.  Snoring respirations  Abdominal: Soft. Bowel sounds are normal.  Musculoskeletal: She exhibits no edema.  Lymphadenopathy:    She has no cervical adenopathy.  Neurological: She is unresponsive. GCS eye subscore is 1. GCS verbal subscore is 1. GCS motor subscore is 1.  + clonus bilaterally No response to noxious stimulus  Skin: No rash noted.    ED Course  Procedures (including critical care time) Labs  Review Labs Reviewed  ACETAMINOPHEN LEVEL - Abnormal; Notable for the following:    Acetaminophen (Tylenol), Serum 140 (*)    All other components within normal limits  COMPREHENSIVE METABOLIC PANEL - Abnormal; Notable for the following:    Potassium 3.3 (*)    Glucose, Bld 119 (*)    Calcium 8.7 (*)    All other components within normal limits  CBC WITH DIFFERENTIAL/PLATELET - Abnormal; Notable for the following:    Neutrophils Relative % 32 (*)    Lymphocytes Relative 58 (*)    Lymphs Abs 5.0 (*)    All other components within normal limits  VALPROIC ACID LEVEL - Abnormal; Notable for the following:    Valproic Acid Lvl <10 (*)    All other components within normal limits  CK -  Abnormal; Notable for the following:    Total CK 345 (*)    All other components within normal limits  I-STAT CHEM 8, ED - Abnormal; Notable for the following:    Potassium 3.3 (*)    Glucose, Bld 119 (*)    Calcium, Ion 1.10 (*)    All other components within normal limits  CBG MONITORING, ED - Abnormal; Notable for the following:    Glucose-Capillary 125 (*)    All other components within normal limits  I-STAT CG4 LACTIC ACID, ED - Abnormal; Notable for the following:    Lactic Acid, Venous 2.75 (*)    All other components within normal limits  I-STAT ARTERIAL BLOOD GAS, ED - Abnormal; Notable for the following:    pH, Arterial 7.283 (*)    pCO2 arterial 56.9 (*)    pO2, Arterial 360.0 (*)    Bicarbonate 26.9 (*)    All other components within normal limits  ETHANOL  SALICYLATE LEVEL  AMMONIA  PHOSPHORUS  URINE RAPID DRUG SCREEN (HOSP PERFORMED)  URINALYSIS, ROUTINE W REFLEX MICROSCOPIC  MAGNESIUM  ACETAMINOPHEN LEVEL  MAGNESIUM  COMPREHENSIVE METABOLIC PANEL  PHOSPHORUS  TRIGLYCERIDES  PROTIME-INR  POC URINE PREG, ED    Imaging Review Ct Head Wo Contrast  01/19/2015   CLINICAL DATA:  Found in car unresponsive  EXAM: CT HEAD WITHOUT CONTRAST  TECHNIQUE: Contiguous axial images were  obtained from the base of the skull through the vertex without intravenous contrast.  COMPARISON:  12/10/2014  FINDINGS: There is no intracranial hemorrhage, mass or evidence of acute infarction. Gray matter and white matter are normal. The ventricles and basal cisterns appear unremarkable.  The bony structures are intact. The visible portions of the paranasal sinuses are clear.  IMPRESSION: Normal brain   Electronically Signed   By: Andreas Newport M.D.   On: 01/19/2015 23:40   Dg Chest Portable 1 View  01/19/2015   CLINICAL DATA:  Overdose, unresponsive  EXAM: PORTABLE CHEST - 1 VIEW  COMPARISON:  11/25/2014  FINDINGS: Endotracheal tube tip is 3.8 cm above the carina. There is minimal curvilinear atelectasis above the slightly elevated right hemidiaphragm. The lungs are otherwise clear. There is no large effusion. Hilar, mediastinal and cardiac contours appear unremarkable.  IMPRESSION: Satisfactory ET tube position. Minimal linear atelectatic appearing opacities in the right base.   Electronically Signed   By: Andreas Newport M.D.   On: 01/19/2015 23:27     EKG Interpretation   Date/Time:  Monday Jan 20 2015 00:16:46 EDT Ventricular Rate:  121 PR Interval:  155 QRS Duration: 117 QT Interval:  347 QTC Calculation: 492 R Axis:   108 Text Interpretation:  Sinus tachycardia Incomplete right bundle branch  block Low voltage, precordial leads prolonged QT from previous EKG  resolved  Confirmed by YAO  MD, DAVID ([id number]) on 01/20/2015 12:45:45 AM       INTUBATION Performed by: Abigail Butts  Required items: required blood products, implants, devices, and special equipment available Patient identity confirmed: provided demographic data and hospital-assigned identification number Time out: Immediately prior to procedure a "time out" was called to verify the correct patient, procedure, equipment, support staff and site/side marked as required.  Indications: unresponsive  Intubation  method: Glidescope Laryngoscopy   Preoxygenation: BVM  Sedatives: Etomidate Paralytic: Rocuronium   Tube Size: 108mm cuffed  Post-procedure assessment: chest rise and ETCO2 monitor Breath sounds: equal and absent over the epigastrium Tube secured with: ETT holder Chest x-ray interpreted by radiologist and me.  Chest x-ray findings: endotracheal tube in appropriate position  Patient tolerated the procedure well with no immediate complications.    CRITICAL CARE Performed by: Abigail Butts Total critical care time: 34min Critical care time was exclusive of separately billable procedures and treating other patients. Critical care was necessary to treat or prevent imminent or life-threatening deterioration. Critical care was time spent personally by me on the following activities: development of treatment plan with patient and/or surrogate as well as nursing, discussions with consultants, evaluation of patient's response to treatment, examination of patient, obtaining history from patient or surrogate, ordering and performing treatments and interventions, ordering and review of laboratory studies, ordering and review of radiographic studies, pulse oximetry and re-evaluation of patient's condition.   MDM   Final diagnoses:  Respiratory failure  Tylenol overdose, undetermined intent, initial encounter   [name] presents unresponsive.  Hx of SI and tylenol OD; presumed overdose tonight.  Pt with clonus, but no other focal neurologic signs.  Pt evaluated by Dr. Leonel Ramsay of neurology.  Will obtain labs and CT head.  Pt with snoring respirations and is not protecting her airway; intubated in the ED.    Pt with hx of pseudoseizures and normal EEG; doubt status epilepticus tonight.    CK 345. Lactic acid 2.75. Mild hypokalemia. No leukocytosis. Ammonia level normal. Negative alcohol. Tylenol level 140. Normal CT head.  Poison control contacted who recommends administration of  N-acetylcysteine.  She has a prolonged QT on her EKG. They also recommend bicarbonate. Patient will need admission to be CCM.  12:47 AM Pt admitted to PCCM by Rahul Desai PA-C.    BP 114/66 mmHg  Pulse 122  Resp 20  Wt 137 lb 5.6 oz (62.3 kg)  SpO2 98%  The patient was discussed with and seen by Dr. Darl Householder who agrees with the treatment plan.    Jarrett Soho Brindy Higginbotham, PA-C 01/20/15 0101  Wandra Arthurs, MD 01/20/15 [phone]

## 2015-01-19 NOTE — ED Notes (Signed)
Dr. Darl Householder and Jarrett Soho PA at bedside for intubation
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
## 2015-01-19 NOTE — Discharge Summary (Signed)
PATIENT NAME:  [name], [name] MR#:  [id number] DATE OF BIRTH:  08-Nov-1965  DATE OF ADMISSION:  10/29/2014 DATE OF DISCHARGE:  11/14/2014  HOSPITAL COURSE: See dictated history and physical for details of admission. Briefly this 49 year old woman has a history of major depression and multiple serious suicide attempts and has not responded to trials of medication as an outpatient and is continuing to endorse symptoms of severe depression with suicidal ideation. She was admitted on February 9 initially to the service of Dr. Patsey Berthold. I was consulted to see the patient for possible ECT treatment on February 12. The patient gave a history of severe recurrent depression and nonresponse to medication and also had a positive history of ECT in the past. After being informed and having ample opportunity to discuss the pros and cons of treatment the patient gave informed consent. I took over as the primary physician and the patient began ECT treatment the following Monday. In total she had 5 right unilateral single seizure, single stimulus treatments while in the hospital. She also continued treatment on medication for her depression and mood instability. The patient had significant headache problems after each treatment, but otherwise tolerated it well. She complained of mild confusion, but did not become delirious. After a couple of treatments the patient started to show some improvement. She subjectively felt that her mood was getting better. Staff noted that she had always appeared to be fairly outgoing and euthymic on the ward and continued to be so throughout the end of her hospital stay. The patient did not make any suicidal attempts while in the hospital or act out in aggressive way or display psychotic symptoms. After 5 treatments the patient seemed to have had improvement and was not getting further improvement that outweighed the side effects. A decision was made to discharge her home on the 25th. The patient was to  follow up with [facility] in the community. She was to continue the current medication. We discussed the potential benefits of maintenance ECT, but the treatment would not be available because of her lack of insurance as well as her lack of transportation. The patient was educated about the risk of return of depressive symptoms after ECT treatment. Strongly encouraged to make every effort to follow up with appropriate outpatient care.   MENTAL STATUS AT DISCHARGE: Reasonably well-groomed woman, looks her stated age, cooperative with the interview. Eye contact good. Psychomotor activity normal. Speech normal rate, tone, and volume. Affect euthymic, mildly anxious, but reactive, not panicky. Mood stated as all right. Thoughts are lucid without loosening of associations or delusions. Denies auditory or visual hallucinations. Denies suicidal or homicidal ideation. She is alert and oriented x 3. She can repeat 3 words immediately, remembers all of them at 3 minutes. Judgment and insight appear to be reasonably intact. Intelligence and fund of knowledge normal.   DISCHARGE MEDICATIONS:  Keppra 500 mg twice a day, Prozac 40 mg once a day, clonazepam 0.5 mg twice a day, Soma 350 mg at night, pantoprazole 40 mg twice a day, calcium plus vitamin D supplement 1 tablet twice a day, multivitamin once a day, gabapentin 300 mg 3 times a day, hydroxyzine 50 mg at night as needed for insomnia, Robaxin 750 mg every 6 hours as needed for back pain.   LABORATORY RESULTS: Admission laboratories included a chemistry panel that was unremarkable except for a slightly elevated alkaline phosphatase and an elevated ALT. Hematology panel, elevated platelet count 450,000, normal white count, normal red cell count. Urinalysis unremarkable.  DISPOSITION: Discharge to living on her own and follow up with [facility] in the community.   DIAGNOSIS PRINCIPAL AND PRIMARY:   AXIS I: Major depression, severe, recurrent, versus bipolar disorder  type II.   SECONDARY DIAGNOSES:   AXIS I: No further.   AXIS II: Borderline and histrionic features prominent.   AXIS III:  1.  Status post multiple overdoses of acetaminophen, apparently without long-term residual complications.  2.  Gastric reflux symptoms.  3.  Chronic pain.     ____________________________ Gonzella Lex, MD jtc:bu D: 12/11/2014 15:32:04 ET T: 12/11/2014 16:15:26 ET JOB#: [id number]  cc: Gonzella Lex, MD, <Dictator> Gonzella Lex MD ELECTRONICALLY SIGNED 12/23/2014 9:57

## 2015-01-19 NOTE — Consult Note (Signed)
Neurology Consultation Reason for Consult: Altered Mental Status Referring Physician: Darl Householder, D  CC: Altered Mental Status  History is obtained from: medical record   HPI: [name] is a 49 y.o. female who was recently treated with intentional tylenol overdose with significant psychiatric history who was found in a car unresponsive in the [facility] parking lot. Events leading to her presence in the ED are unclear.   Of note, while in the psych hospital she had abnormal movements with concern for seizure vs pseudoseizure. An EEG was obtained which captured one of her typical events demonstrating that it was not epileptic in nature.    ROS: Unable to obtain due to altered mental status.   Past Medical History  Diagnosis Date  . Migraines   . Endometriosis   . ETOH abuse     sober 3 1/2 years  . Anorexia   . Depression   . Anxiety   . DJD (degenerative joint disease) of cervical spine   . Seizures   . PICC (peripherally inserted central catheter) in place     rt neck    Family History: Unable to assess secondary to patient's altered mental status.    Social History: Tob: Unable to assess secondary to patient's altered mental status.    Exam: Current vital signs: BP 157/114 mmHg  Pulse 139  SpO2 100% Vital signs in last 24 hours: Pulse Rate:  [139] 139 (05/01 2246) BP: (157)/(114) 157/114 mmHg (05/01 2246) SpO2:  [100 %] 100 % (05/01 2246)  Physical Exam  Constitutional: Appears well-developed and well-nourished.  Psych: unresponsive Eyes: No scleral injection HENT: No OP obstrucion Head: Normocephalic.  Cardiovascular: tachycardic Respiratory: NRB in place, upper airway sounds.  GI: Soft.   Skin: WDI  Neuro: Mental Status: Patient is obtunded.  Does not follow commands.  Cranial Nerves: II: Does not blink to threat. Pupils are equal, round, and reactive to light.   III,IV, VI: dolls eye + V, VII: corneals intact.  VIII, X, XI, XII: Unable to assess secondary to  patient's altered mental status.  Motor: Tone is normal. Bulk is normal. Quasi-purposeful movements of bilateral upper extremities. Withdrawal in LE Sensory: Responds to nox stim x 4.  Deep Tendon Reflexes: 2+ and symmetric in the biceps and patellae, clonus bilateral ankles.  Cerebellar: Unable to assess secondary to patient's altered mental status.      I have reviewed labs in epic and the results pertinent to this consultation are: Chem - 8 - unremarkable  I have reviewed the images obtained:CT head - unremarkable  Impression: 49 yo F with AMS and non-localizing exam. I suspect that she has a toxic-metabolic encephalopathy though the cause at this time is unclear. Possibilities include medication effect/toxicity, drug use, hyperammonemia from depakote, or other toxic-metabolic states.   Recommendations: 1) Ammonia, VPA level 2) If no improvement by morning, would get MRI and EEG.    Roland Rack, MD [facility] [phone]  If 7pm- 7am, please page neurology on call as listed in [place].
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
## 2015-01-19 NOTE — H&P (Signed)
PATIENT NAME:  [name], [name] MR#:  [id number] DATE OF BIRTH:  August 25, 1966  DATE OF ADMISSION:  10/29/2014  ADDENDUM:  Continuation of History and Physicial.   REVIEW OF SYSTEMS: The patient complains of having low back pain and muscle spasms. The rest of the 10-point review of systems is negative.   MENTAL STATUS EXAMINATION: The patient is a 49 year old Caucasian female who appears her stated age. She displays fair grooming and hygiene. She was calm, pleasant and cooperative. Psychomotor activity mildly retarded. Eye contact was within normal range. Speech had decreased tone and volume, normal rate. Thought process linear. Thought content negative for suicidality, homicidality. Perception negative for psychosis. Negative for auditory or visual hallucinations. Mood dysphoric. Affect congruent. Insight and judgment limited. On cognitive examination, she is alert and oriented to person, place, time, and situation. Attention and concentration appear to be grossly intact; however, it was not formally tested. Fund of knowledge appears to be average for her level of education.   PHYSICAL EXAMINATION:  GENERAL: The patient is a 49 year old Caucasian female in no acute distress.  VITAL SIGNS: Blood pressure 130/96, respirations 20, pulse 85, temperature 98.  MUSCULOSKELETAL: Normal gait, normal muscular tone. No evidence of involuntary movements.   LABORATORY RESULTS: No laboratory results have been yet obtained from this hospitalization.   DIAGNOSES:  1.  Major depressive disorder, severe, recurrent.  2.  Posttraumatic stress disorder. 3.  Alcohol use disorder, severe, in full sustained remission.  4.  Chronic pain.  5.  Gastric ulcers.  6.  Seizure disorder.  7.  Status post overdose on Tylenol.   ASSESSMENT: The patient is a 49 year old Caucasian female who has been hospitalized a multitude of times after several suicide attempts where she had overdosed on Tylenol. The patient was recently discharged  from the behavioral health unit after she was admitted there due to a Tylenol overdose. It appears that this patient has failed to follow up after discharge from their facility and she is not connected to any mental health agency. It is also not that her medication list included clonazepam, Soma and OxyContin. I do suspect that there might be a component related to substance abuse, and perhaps substance withdrawal. The patient is also not connected to any primary care, despite the multitude of hospitalizations and overdoses on Tylenol, that even needed to be referred for an evaluation with the [facility] Liver Transplant Team. I do not feel that the patient has been forthcoming with the triggers that led to this hospitalization.   PLAN: For major depressive disorder, she will be continued on Prozac 20 mg p.o. daily. For  anxiety, I will decrease the clonazepam from 1 mg 3 times a day to 0.5 mg every 8 hours. For chronic pain, she will be continued on gabapentin 300 mg 3 times a day and Soma 300 mg p.o. at bedtime. For seizure disorder, she will be continued on Keppra 500 mg every 12 hours. For acid reflux, she will be started on pantoprazole 40 mg p.o. b.i.d. For insomnia, the patient will receive hydroxyzine 50 mg at bedtime p.r.n. The patient tells me that she has a fentanyl patch on. She does have a patch on her shoulder that has no date on it. Per the discharge summary from [facility], there was no intention of her being treated with fentanyl patches, and this is not listed on the medications that were discontinued or the medications that she was discharged with. Orders have been given to the nursing staff to remove the  patch. I will order LFTs in order to follow up that they are trending down. I will also order a TSH and a vitamin B12 and an ammonia level.   DISCHARGE DISPOSITION: Once stable, this patient will be discharged back to her home. Due to her high risk for suicidality, this patient could benefit from  being transferred to be set up with ACT Team services. She, however, does not have insurance and sometimes ACT Teams might not have enough spots for [employer]. We will discuss this with the social worker and see if this is a possibility. At this point in time, substance abuse issues and not active,  and the patient is not interested in prolonging inpatient treatment as she would like to return to work. If unable to set Act Team, the patient will set up with community mental health clinic in the [facility].    ____________________________ Hildred Priest, MD ahg:MT D: 10/30/2014 16:08:00 ET T: 10/30/2014 16:23:12 ET JOB#: [id number]  cc: Hildred Priest, MD, <Dictator> Rhodia Albright MD ELECTRONICALLY SIGNED 11/05/2014 16:28

## 2015-01-19 NOTE — Consult Note (Signed)
ECT: Follow-up for this patient with recurrent severe depression.  Patient continues to report depressed mood and passive suicidal ideation.  Feeling of hopelessness.  Patient states that she would like to pursue ECT on Monday.  She has already seen the video and met with the nurse from ECT and sign the consent paperwork. review of systems continues to complain of full body pain.  Depressed mood.  No psychotic symptoms. mental status mildly disheveled woman looks her stated age.  Cooperative with the interview.  Good eye contact normal psychomotor activity.  Speech normal rate tone and volume.  Mood stated as depressed.  Passive suicidal thoughts.  No obvious psychosis.  Alert and oriented 4.  Judgment and insight adequate. agrees to ECT and we will plan on right unilateral treatment to begin Monday morning.  I have put in orders for nursing to hold her anticonvulsant and her benzodiazepine both the night before and morning of treatment.  Orders are in place for anesthesia. Major depression severe recurrent. have also ordered Robaxin on a when necessary basis for her complaints of chronic back pain.  Electronic Signatures: Gonzella Lex (MD)  (Signed on 12-Feb-16 22:13)  Authored  Last Updated: 12-Feb-16 22:13 by Gonzella Lex (MD)

## 2015-01-19 NOTE — Progress Notes (Signed)
Patient transported to CT while on the vent with no complications.

## 2015-01-20 ENCOUNTER — Encounter (HOSPITAL_COMMUNITY): Payer: Self-pay | Admitting: *Deleted

## 2015-01-20 ENCOUNTER — Inpatient Hospital Stay (HOSPITAL_COMMUNITY): Payer: Medicaid Other

## 2015-01-20 DIAGNOSIS — J9602 Acute respiratory failure with hypercapnia: Secondary | ICD-10-CM

## 2015-01-20 DIAGNOSIS — F17210 Nicotine dependence, cigarettes, uncomplicated: Secondary | ICD-10-CM | POA: Diagnosis present

## 2015-01-20 DIAGNOSIS — T43012A Poisoning by tricyclic antidepressants, intentional self-harm, initial encounter: Secondary | ICD-10-CM | POA: Diagnosis not present

## 2015-01-20 DIAGNOSIS — F319 Bipolar disorder, unspecified: Secondary | ICD-10-CM | POA: Diagnosis not present

## 2015-01-20 DIAGNOSIS — F332 Major depressive disorder, recurrent severe without psychotic features: Secondary | ICD-10-CM | POA: Diagnosis present

## 2015-01-20 DIAGNOSIS — E876 Hypokalemia: Secondary | ICD-10-CM | POA: Diagnosis present

## 2015-01-20 DIAGNOSIS — R4182 Altered mental status, unspecified: Secondary | ICD-10-CM | POA: Diagnosis not present

## 2015-01-20 DIAGNOSIS — R1013 Epigastric pain: Secondary | ICD-10-CM | POA: Diagnosis not present

## 2015-01-20 DIAGNOSIS — J9601 Acute respiratory failure with hypoxia: Secondary | ICD-10-CM | POA: Diagnosis not present

## 2015-01-20 DIAGNOSIS — M47892 Other spondylosis, cervical region: Secondary | ICD-10-CM | POA: Diagnosis present

## 2015-01-20 DIAGNOSIS — T391X2A Poisoning by 4-Aminophenol derivatives, intentional self-harm, initial encounter: Secondary | ICD-10-CM | POA: Diagnosis present

## 2015-01-20 DIAGNOSIS — R739 Hyperglycemia, unspecified: Secondary | ICD-10-CM | POA: Diagnosis present

## 2015-01-20 DIAGNOSIS — J9690 Respiratory failure, unspecified, unspecified whether with hypoxia or hypercapnia: Secondary | ICD-10-CM | POA: Diagnosis present

## 2015-01-20 DIAGNOSIS — G4700 Insomnia, unspecified: Secondary | ICD-10-CM | POA: Diagnosis present

## 2015-01-20 DIAGNOSIS — G92 Toxic encephalopathy: Secondary | ICD-10-CM | POA: Diagnosis present

## 2015-01-20 DIAGNOSIS — T391X1D Poisoning by 4-Aminophenol derivatives, accidental (unintentional), subsequent encounter: Secondary | ICD-10-CM

## 2015-01-20 DIAGNOSIS — T428X2A Poisoning by antiparkinsonism drugs and other central muscle-tone depressants, intentional self-harm, initial encounter: Secondary | ICD-10-CM | POA: Diagnosis present

## 2015-01-20 DIAGNOSIS — F411 Generalized anxiety disorder: Secondary | ICD-10-CM | POA: Diagnosis present

## 2015-01-20 DIAGNOSIS — F4310 Post-traumatic stress disorder, unspecified: Secondary | ICD-10-CM | POA: Diagnosis present

## 2015-01-20 DIAGNOSIS — F329 Major depressive disorder, single episode, unspecified: Secondary | ICD-10-CM | POA: Diagnosis not present

## 2015-01-20 DIAGNOSIS — R45851 Suicidal ideations: Secondary | ICD-10-CM | POA: Diagnosis not present

## 2015-01-20 DIAGNOSIS — F1010 Alcohol abuse, uncomplicated: Secondary | ICD-10-CM | POA: Diagnosis not present

## 2015-01-20 DIAGNOSIS — E872 Acidosis: Secondary | ICD-10-CM | POA: Diagnosis present

## 2015-01-20 DIAGNOSIS — J9600 Acute respiratory failure, unspecified whether with hypoxia or hypercapnia: Secondary | ICD-10-CM | POA: Diagnosis present

## 2015-01-20 DIAGNOSIS — T1491 Suicide attempt: Secondary | ICD-10-CM | POA: Diagnosis not present

## 2015-01-20 DIAGNOSIS — T391X4D Poisoning by 4-Aminophenol derivatives, undetermined, subsequent encounter: Secondary | ICD-10-CM | POA: Diagnosis not present

## 2015-01-20 DIAGNOSIS — R4020 Unspecified coma: Secondary | ICD-10-CM | POA: Diagnosis present

## 2015-01-20 LAB — RAPID URINE DRUG SCREEN, HOSP PERFORMED
Amphetamines: NOT DETECTED
BENZODIAZEPINES: POSITIVE — AB
Barbiturates: NOT DETECTED
Cocaine: NOT DETECTED
Opiates: POSITIVE — AB
TETRAHYDROCANNABINOL: NOT DETECTED

## 2015-01-20 LAB — COMPREHENSIVE METABOLIC PANEL
ALT: 16 U/L (ref 14–54)
ANION GAP: 9 (ref 5–15)
AST: 24 U/L (ref 15–41)
Albumin: 3 g/dL — ABNORMAL LOW (ref 3.5–5.0)
Alkaline Phosphatase: 68 U/L (ref 38–126)
BUN: 8 mg/dL (ref 6–20)
CALCIUM: 7.8 mg/dL — AB (ref 8.9–10.3)
CO2: 22 mmol/L (ref 22–32)
Chloride: 107 mmol/L (ref 101–111)
Creatinine, Ser: 0.55 mg/dL (ref 0.44–1.00)
GFR calc Af Amer: >60 mL/min (ref >60)
Glucose, Bld: 124 mg/dL — ABNORMAL HIGH (ref 70–99)
POTASSIUM: 3.7 mmol/L (ref 3.5–5.1)
SODIUM: 138 mmol/L (ref 135–145)
TOTAL PROTEIN: 5.1 g/dL — AB (ref 6.5–8.1)
Total Bilirubin: 0.3 mg/dL (ref 0.3–1.2)

## 2015-01-20 LAB — ACETAMINOPHEN LEVEL: Acetaminophen (Tylenol), Serum: 174 ug/mL (ref 10–30)

## 2015-01-20 LAB — PHOSPHORUS
Phosphorus: 2.9 mg/dL (ref 2.5–4.6)
Phosphorus: 4.1 mg/dL (ref 2.5–4.6)

## 2015-01-20 LAB — URINALYSIS, ROUTINE W REFLEX MICROSCOPIC
Bilirubin Urine: NEGATIVE
GLUCOSE, UA: NEGATIVE mg/dL
HGB URINE DIPSTICK: NEGATIVE
Ketones, ur: NEGATIVE mg/dL
LEUKOCYTES UA: NEGATIVE
Nitrite: NEGATIVE
PH: 5 (ref 5.0–8.0)
PROTEIN: NEGATIVE mg/dL
Specific Gravity, Urine: 1.015 (ref 1.005–1.030)
Urobilinogen, UA: 0.2 mg/dL (ref 0.0–1.0)

## 2015-01-20 LAB — PROTIME-INR
INR: 1.01 (ref 0.00–1.49)
Prothrombin Time: 13.4 seconds (ref 11.6–15.2)

## 2015-01-20 LAB — CBG MONITORING, ED: Glucose-Capillary: 125 mg/dL — ABNORMAL HIGH (ref 70–99)

## 2015-01-20 LAB — VALPROIC ACID LEVEL: Valproic Acid Lvl: <10 ug/mL — ABNORMAL LOW (ref 50.0–100.0)

## 2015-01-20 LAB — MRSA PCR SCREENING: MRSA BY PCR: NEGATIVE

## 2015-01-20 LAB — MAGNESIUM: MAGNESIUM: 1.7 mg/dL (ref 1.7–2.4)

## 2015-01-20 LAB — CK: Total CK: 345 U/L — ABNORMAL HIGH (ref 38–234)

## 2015-01-20 LAB — TRIGLYCERIDES: TRIGLYCERIDES: 257 mg/dL — AB (ref <150)

## 2015-01-20 MED ORDER — MIDAZOLAM HCL 2 MG/2ML IJ SOLN
2.0000 mg | INTRAMUSCULAR | Status: DC | PRN
  Administered 2015-01-21: 2 mg via INTRAVENOUS
  Filled 2015-01-20: qty 2

## 2015-01-20 MED ORDER — SODIUM BICARBONATE 8.4 % IV SOLN: 50.0000 meq | INTRAVENOUS | Status: DC | PRN

## 2015-01-20 MED ORDER — PANTOPRAZOLE SODIUM 40 MG IV SOLR
40.0000 mg | Freq: Every day | INTRAVENOUS | Status: DC
  Administered 2015-01-20 – 2015-01-21 (×2): 40 mg via INTRAVENOUS
  Filled 2015-01-20 (×3): qty 40

## 2015-01-20 MED ORDER — SODIUM CHLORIDE 0.9 % IV SOLN
INTRAVENOUS | Status: DC
  Administered 2015-01-20 – 2015-01-21 (×3): via INTRAVENOUS

## 2015-01-20 MED ORDER — HEPARIN SODIUM (PORCINE) 5000 UNIT/ML IJ SOLN
5000.0000 [IU] | Freq: Three times a day (TID) | INTRAMUSCULAR | Status: DC
  Administered 2015-01-20 – 2015-01-21 (×5): 5000 [IU] via SUBCUTANEOUS
  Filled 2015-01-20 (×16): qty 1

## 2015-01-20 MED ORDER — CETYLPYRIDINIUM CHLORIDE 0.05 % MT LIQD
7.0000 mL | Freq: Four times a day (QID) | OROMUCOSAL | Status: DC
  Administered 2015-01-20 – 2015-01-21 (×6): 7 mL via OROMUCOSAL

## 2015-01-20 MED ORDER — THIAMINE HCL 100 MG/ML IJ SOLN
100.0000 mg | Freq: Every day | INTRAMUSCULAR | Status: DC
  Administered 2015-01-20 – 2015-01-22 (×3): 100 mg via INTRAVENOUS
  Filled 2015-01-20 (×3): qty 1

## 2015-01-20 MED ORDER — ACETYLCYSTEINE LOAD VIA INFUSION
150.0000 mg/kg | Freq: Once | INTRAVENOUS | Status: AC
  Administered 2015-01-20: 9345 mg via INTRAVENOUS
  Filled 2015-01-20: qty 234

## 2015-01-20 MED ORDER — SODIUM CHLORIDE 0.9 % IV SOLN
1.0000 g | Freq: Once | INTRAVENOUS | Status: AC
  Administered 2015-01-20: 1 g via INTRAVENOUS
  Filled 2015-01-20: qty 10

## 2015-01-20 MED ORDER — PROPOFOL 1000 MG/100ML IV EMUL
0.0000 ug/kg/min | INTRAVENOUS | Status: DC
  Administered 2015-01-21: 15 ug/kg/min via INTRAVENOUS
  Filled 2015-01-20: qty 100

## 2015-01-20 MED ORDER — DEXTROSE 5 % IV SOLN
15.0000 mg/kg/h | INTRAVENOUS | Status: AC
  Administered 2015-01-20: 15 mg/kg/h via INTRAVENOUS
  Filled 2015-01-20: qty 200

## 2015-01-20 MED ORDER — FOLIC ACID 5 MG/ML IJ SOLN
1.0000 mg | Freq: Every day | INTRAMUSCULAR | Status: DC
  Administered 2015-01-20 – 2015-01-22 (×3): 1 mg via INTRAVENOUS
  Filled 2015-01-20 (×3): qty 0.2

## 2015-01-20 MED ORDER — POTASSIUM CHLORIDE 20 MEQ/15ML (10%) PO SOLN
40.0000 meq | Freq: Once | ORAL | Status: AC
  Administered 2015-01-20: 40 meq
  Filled 2015-01-20: qty 30

## 2015-01-20 MED ORDER — LEVETIRACETAM 100 MG/ML PO SOLN: 500.0000 mg | Freq: Two times a day (BID) | ORAL | Status: DC

## 2015-01-20 MED ORDER — METOCLOPRAMIDE HCL 5 MG/ML IJ SOLN: 10.0000 mg | Freq: Once | INTRAMUSCULAR | Status: DC

## 2015-01-20 MED ORDER — CHLORHEXIDINE GLUCONATE 0.12 % MT SOLN
15.0000 mL | Freq: Two times a day (BID) | OROMUCOSAL | Status: DC
  Administered 2015-01-20 – 2015-01-22 (×4): 15 mL via OROMUCOSAL
  Filled 2015-01-20 (×3): qty 15

## 2015-01-20 MED ORDER — MIDAZOLAM HCL 2 MG/2ML IJ SOLN: 2.0000 mg | INTRAMUSCULAR | Status: DC | PRN

## 2015-01-20 NOTE — Procedures (Addendum)
ELECTROENCEPHALOGRAM REPORT   Patient: [name]       Room #: 40M-10 Age: 49 y.o.        Sex: female Report Date:  01/20/2015        Interpreting Physician: Hulen Luster  History: [name] is an 49 y.o. female admitted with altered mental status after a medication overduse.   Medications:  Scheduled: . antiseptic oral rinse  7 mL Mouth Rinse QID  . chlorhexidine  15 mL Mouth Rinse BID  . folic acid  1 mg Intravenous Daily  . heparin  5,000 Units Subcutaneous 3 times per day  . metoCLOPramide (REGLAN) injection  10 mg Intravenous Once  . pantoprazole (PROTONIX) IV  40 mg Intravenous Daily  . thiamine  100 mg Intravenous Daily    Conditions of Recording:  This is a 16 channel EEG carried out with the patient in the unresponsive state.  Description:  The waking background activity consists of a low voltage, symmetrical, poorly organized 8-10 Hz alpha activity, seen from the parieto-occipital and posterior temporal regions. There is intermittent theta activity mixed in.  Low voltage fast activity, poorly organized, is seen anteriorly and is at times superimposed on more posterior regions.  A mixture of theta and alpha rhythms are seen from the central and temporal regions. No focal slowing is noted. Intermittent sharp wave activity is noted with no clear evolution or seizure activity.   Normal sleep architecture is not observed. Hyperventilation and intermittent photic stimulation was not performed.    IMPRESSION: Abnormal EEG due to the presence of continuous non reactive alpha activity. In the right clinical context this can be consistent with alpha coma which can be seen in cases of medication overdose.    Jim Like, DO [facility] [phone]  If 7pm- 7am, please page neurology on call as listed in [place]. 01/20/2015, 2:10 PM

## 2015-01-20 NOTE — Progress Notes (Signed)
Subjective: Patient intubated, breathing over the vent, no response to voice or pain.  Sedation recently discontinued.     Objective: Current vital signs: BP 101/65 mmHg  Pulse 100  Temp(Src) 98.8 F (37.1 C) (Axillary)  Resp 20  Wt 62.3 kg (137 lb 5.6 oz)  SpO2 100% Vital signs in last 24 hours: Temp:  [94.4 F (34.7 C)-98.8 F (37.1 C)] 98.8 F (37.1 C) (05/02 0756) Pulse Rate:  [88-160] 100 (05/02 0832) Resp:  [14-21] 20 (05/02 0832) BP: (86-201)/(61-115) 101/65 mmHg (05/02 0832) SpO2:  [97 %-100 %] 100 % (05/02 0832) FiO2 (%):  [40 %-100 %] 40 % (05/02 0832) Weight:  [62.3 kg (137 lb 5.6 oz)] 62.3 kg (137 lb 5.6 oz) (05/02 0015)  Intake/Output from previous day: 05/01 0701 - 05/02 0700 In: 1866 [I.V.:1866] Out: 925 [Urine:925] Intake/Output this shift: Total I/O In: 123.4 [I.V.:123.4] Out: 105 [Urine:105] Nutritional status: Diet NPO time specified  Neurologic Exam: Mental Status: Patient does not respond to verbal stimuli.  Does not respond to deep sternal rub.  Does not follow commands.  No verbalizations noted.  Cranial Nerves: II: patient does not respond confrontation bilaterally, pupils right 2 mm, left 2 mm,and reactive bilaterally III,IV,VI: doll's response absent bilaterally.  V,VII: corneal reflex present bilaterally  VIII: patient does not respond to verbal stimuli IX,X: gag reflex present, XI: trapezius strength unable to test bilaterally XII: tongue strength unable to test Motor: Extremities flaccid throughout.  No spontaneous movement noted.  No purposeful movements noted. Sensory: Does not respond to noxious stimuli in any extremity. Deep Tendon Reflexes:  Absent throughout. Plantars: absent bilaterally Cerebellar: Unable to perform    Lab Results: Basic Metabolic Panel:  Recent Labs Lab 01/19/15 2316 01/19/15 2318 01/20/15 0033 01/20/15 0228  NA 137 140  --  138  K 3.3* 3.3*  --  3.7  CL 103 103  --  107  CO2 23  --   --  22   GLUCOSE 119* 119*  --  124*  BUN 10 11  --  8  CREATININE 0.72 0.70  --  0.55  CALCIUM 8.7*  --   --  7.8*  MG  --   --   --  1.7  PHOS  --   --  2.9 4.1    Liver Function Tests:  Recent Labs Lab 01/19/15 2316 01/20/15 0228  AST 31 24  ALT 17 16  ALKPHOS 89 68  BILITOT 0.4 0.3  PROT 6.5 5.1*  ALBUMIN 3.9 3.0*   No results for input(s): LIPASE, AMYLASE in the last 168 hours.  Recent Labs Lab 01/19/15 2316  AMMONIA 27    CBC:  Recent Labs Lab 01/19/15 2316 01/19/15 2318  WBC 8.6  --   NEUTROABS 2.7  --   HGB 12.2 13.6  HCT 37.3 40.0  MCV 95.6  --   PLT 249  --     Cardiac Enzymes:  Recent Labs Lab 01/19/15 2327  CKTOTAL 345*    Lipid Panel:  Recent Labs Lab 01/20/15 0228  TRIG 257*    CBG:  Recent Labs Lab 01/19/15 2236  GLUCAP 125*    Microbiology: Results for orders placed or performed during the hospital encounter of 01/19/15  MRSA PCR Screening     Status: None   Collection Time: 01/20/15  1:29 AM  Result Value Ref Range Status   MRSA by PCR NEGATIVE NEGATIVE Final    Comment:        The GeneXpert MRSA  Assay (FDA approved for NASAL specimens only), is one component of a comprehensive MRSA colonization surveillance program. It is not intended to diagnose MRSA infection nor to guide or monitor treatment for MRSA infections.     Coagulation Studies:  Recent Labs  01/20/15 0228  LABPROT 13.4  INR 1.01    Imaging: Ct Head Wo Contrast  01/19/2015   CLINICAL DATA:  Found in car unresponsive  EXAM: CT HEAD WITHOUT CONTRAST  TECHNIQUE: Contiguous axial images were obtained from the base of the skull through the vertex without intravenous contrast.  COMPARISON:  12/10/2014  FINDINGS: There is no intracranial hemorrhage, mass or evidence of acute infarction. Gray matter and white matter are normal. The ventricles and basal cisterns appear unremarkable.  The bony structures are intact. The visible portions of the paranasal  sinuses are clear.  IMPRESSION: Normal brain   Electronically Signed   By: Andreas Newport M.D.   On: 01/19/2015 23:40   Dg Chest Portable 1 View  01/19/2015   CLINICAL DATA:  Overdose, unresponsive  EXAM: PORTABLE CHEST - 1 VIEW  COMPARISON:  11/25/2014  FINDINGS: Endotracheal tube tip is 3.8 cm above the carina. There is minimal curvilinear atelectasis above the slightly elevated right hemidiaphragm. The lungs are otherwise clear. There is no large effusion. Hilar, mediastinal and cardiac contours appear unremarkable.  IMPRESSION: Satisfactory ET tube position. Minimal linear atelectatic appearing opacities in the right base.   Electronically Signed   By: Andreas Newport M.D.   On: 01/19/2015 23:27    Medications:  Scheduled: . antiseptic oral rinse  7 mL Mouth Rinse QID  . chlorhexidine  15 mL Mouth Rinse BID  . folic acid  1 mg Intravenous Daily  . heparin  5,000 Units Subcutaneous 3 times per day  . lidocaine (cardiac) 100 mg/51ml      . metoCLOPramide (REGLAN) injection  10 mg Intravenous Once  . pantoprazole (PROTONIX) IV  40 mg Intravenous Daily  . succinylcholine      . thiamine  100 mg Intravenous Daily   Etta Quill PA-C [facility] [phone]  01/20/2015, 10:23 AM  Patient seen and examined.  Clinical course and management discussed.  Necessary edits performed.  I agree with the above.  Assessment and plan of care developed and discussed below.     Assessment/Plan: 49 YO female S/P Tylenol OD.  Patient recently with discontinuation of sedation but remains unresponsive. Initial CT head showed no acute abnormalities. Ammonia level WNL.   Recommendations: 1.  EEG 2.  If EEG unremarkable and patient remains unresponsive overnight would perform MRI of brain in AM.     Alexis Goodell, MD [facility] [phone]  01/20/2015  1:58 PM

## 2015-01-20 NOTE — Clinical Social Work Note (Signed)
Clinical Social Worker received notification from RN that patient family was likely not notified of patient admission last evening.  CSW contacted patient sister Christin Fudge) who is listed as the primary contact on patient facesheet.  Patient sister states that she is primary contact, patient has never been married and has no children.  Patient sister provided CSW with contact information and password to receive medical information over the phone.  CSW notified RN who will update MD.  CSW remains available for support as needed.  Barbette Or, [place]

## 2015-01-20 NOTE — Progress Notes (Signed)
RT note: Pt transported to 2T82 w/o complications.

## 2015-01-20 NOTE — Progress Notes (Signed)
Initial Nutrition Assessment  DOCUMENTATION CODES:  Not applicable  INTERVENTION:   (If TF started, recommend Vital AF 1.2 formula -- initiate at 20 ml/hr and increase by 10 ml every 4 hours to goal rate of 50 ml/hr to provide 1440 kcals, 90 gm protein, 973 ml of free water )  NUTRITION DIAGNOSIS:  Inadequate oral intake related to inability to eat as evidenced by NPO status.  GOAL:  Patient will meet greater than or equal to 90% of their needs  MONITOR:  Vent status, Weight trends, Labs, I & O's (TF initiation)  REASON FOR ASSESSMENT: Ventilator  ASSESSMENT: 49 y.o. female who was recently treated with intentional tylenol overdose with significant psychiatric history who was found in a car unresponsive in the [facility] parking lot. Events leading to her presence in the ED are unclear.   RD unable to obtain nutrition hx.  Noted pt with PMH of anorexia.  Patient is currently intubated on ventilator support -- OGT in place MV: 9.8 L/min Temp (24hrs), Avg:97.4 F (36.3 C), Min:94.4 F (34.7 C), Max:99.2 F (37.3 C)   Height:  Ht Readings from Last 1 Encounters:  12/11/14 5\' 1"  (1.549 m)    Weight:  Wt Readings from Last 1 Encounters:  01/20/15 137 lb 5.6 oz (62.3 kg)    Ideal Body Weight:  47.7 kg  Wt Readings from Last 10 Encounters:  01/20/15 137 lb 5.6 oz (62.3 kg)  12/15/14 137 lb 5.6 oz (62.3 kg)  10/27/14 121 lb 0.5 oz (54.9 kg)  10/09/14 118 lb (53.524 kg)  10/01/14 125 lb 4.8 oz (56.836 kg)  07/15/14 139 lb 11.2 oz (63.368 kg)  03/20/14 136 lb 3.2 oz (61.78 kg)  11/04/13 130 lb (58.968 kg)  05/18/12 151 lb (68.493 kg)    BMI:  Body mass index is 25.96 kg/(m^2).  Estimated Nutritional Needs:  Kcal:  1451  Protein:  90-100 gm  Fluid:  per MD  Skin:  Reviewed, no issues  Diet Order:  Diet NPO time specified  EDUCATION NEEDS:  No education needs identified at this time   Intake/Output Summary (Last 24 hours) at 01/20/15 1316 Last data  filed at 01/20/15 1200  Gross per 24 hour  Intake   2483 ml  Output   1135 ml  Net   1348 ml    Last BM:  unknown  Arthur Holms, RD, LDN Pager #: [phone] After-Hours Pager #: [phone]

## 2015-01-20 NOTE — Progress Notes (Signed)
EEG Completed; Results Pending  

## 2015-01-20 NOTE — Progress Notes (Signed)
[facility] Progress Note Patient Name: [name] DOB: 08/25/1966 MRN: [id number]   Date of Service  01/20/2015  HPI/Events of Note  POISON CTROL REC NOTED  eICU Interventions  LFT, LEVEL AT 1 AM     Intervention Category Major Interventions: OtherRaylene Miyamoto. 01/20/2015, 5:04 PM

## 2015-01-20 NOTE — H&P (Signed)
PULMONARY / CRITICAL CARE MEDICINE   Name: [name] MRN: [id number] DOB: Feb 14, 1966    ADMISSION DATE:  01/19/2015 CONSULTATION DATE:  01/20/2015  REFERRING MD :  EDP  CHIEF COMPLAINT:  AMS  INITIAL PRESENTATION:  48 y.o. F brought to [facility] ED 5/1 after being found unresponsive by EMS.  In ED, remained unresponsive and was found to have tylenol level of 140. She was intubated and started on NAC.  Of note, she has hx of intentional acetaminophen overdose in the past as part of suicide attempt.   STUDIES:  CXR 5/1 >>> no acute process CT head 5/1 >>> no acute process  SIGNIFICANT EVENTS: 5/1 - brought to ED after being found unresponsive.  Admitted with tylenol overdose.   HISTORY OF PRESENT ILLNESS:  Pt is encephalopathic; therefore, this HPI is obtained from chart review. [name] is a 49 y.o. F with PMH as outlined below.  She was brought to [facility] ED 5/1 by EMS after a bystander saw her slumped over in her car in a nearby parking lot.  [employer] first responded and found her unresponsive with ? Convulsions.  [employer] found a brown paper bag with aluminum foil and a spoon in her car.  No street drugs were found.  She was brought to ED where she remained unresponsive to sternal rub or ammonia inhalants.  She was intubated for airway protection and PCCM was called for admission.  Of note, she has hx of admission for intentional tylenol overdose as part of suicide attempt.  Last admission here for this was in 2015 where tylenol level was 492.  On this admission, tylenol level was 140.  UDS is pending.  She was seen in consultation by neurology.  She apparently was just discharged from [place] facility earlier this month and while there, she had a an EEG performed for concern of seizure vs pseudoseizures.  EEG captured on of her typical events and demonstrated that it was not epileptic in nature.  CT head was negative and neuro recommended obtaining ammonia and depakote levels.  If pt has no  improvement by AM, they will obtain MRI and EEG.   PAST MEDICAL HISTORY :   has a past medical history of Migraines; Endometriosis; ETOH abuse; Anorexia; Depression; Anxiety; DJD (degenerative joint disease) of cervical spine; Psychiatric pseudoseizure; and PICC (peripherally inserted central catheter) in place.  has past surgical history that includes Knee surgery; Abdominal surgery; Cholecystectomy; Appendectomy; Abdominal hysterectomy; Nasal sinus surgery; laproscopy; Carpal tunnel release; and Esophagogastroduodenoscopy (N/A, 10/06/2014). Prior to Admission medications   Medication Sig Start Date End Date Taking? Authorizing Provider  butalbital-acetaminophen-caffeine (FIORICET, ESGIC) 50-325-40 MG per tablet Take 1 tablet by mouth every 6 (six) hours as needed for headache. 12/18/14   Kelvin Cellar, MD  Calcium Carbonate-Vitamin D (CALCIUM-VITAMIN D3 PO) Take 1 capsule by mouth daily.    Historical Provider, MD  carisoprodol (SOMA) 350 MG tablet Take 1 tablet (350 mg total) by mouth at bedtime. Patient taking differently: Take 350 mg by mouth 2 (two) times daily.  10/19/14   Knox Royalty, NP  clonazePAM (KLONOPIN) 1 MG tablet Take 1 tablet (1 mg total) by mouth 3 (three) times daily as needed (anxiety). 10/19/14   Knox Royalty, NP  diphenhydrAMINE (BENADRYL) 25 MG tablet Take 25 mg by mouth every 6 (six) hours as needed for itching or allergies.    Historical Provider, MD  FLUoxetine (PROZAC) 20 MG capsule Take 4 capsules (80 mg total) by mouth daily.  12/17/14   Costin Karlyne Greenspan, MD  gabapentin (NEURONTIN) 100 MG capsule Take 1 capsule (100 mg total) by mouth 3 (three) times daily. Patient taking differently: Take 300 mg by mouth 3 (three) times daily.  10/19/14   Knox Royalty, NP  hydrOXYzine (ATARAX/VISTARIL) 25 MG tablet Take 25 mg by mouth every 6 (six) hours as needed for itching.    Historical Provider, MD  isometheptene-acetaminophen-dichloralphenazone (MIDRIN) 65-325-100 MG capsule Take  1 capsule by mouth every 6 (six) hours as needed for migraine. Maximum 5 capsules in 12 hours for migraine headaches, 8 capsules in 24 hours for tension headaches.    Historical Provider, MD  levETIRAcetam (KEPPRA) 500 MG tablet Take 1 tablet (500 mg total) by mouth 2 (two) times daily. 10/19/14   Knox Royalty, NP  lisinopril (PRINIVIL,ZESTRIL) 20 MG tablet Take 20 mg by mouth daily.    Historical Provider, MD  methocarbamol (ROBAXIN) 750 MG tablet Take 750 mg by mouth every 8 (eight) hours as needed for muscle spasms.    Historical Provider, MD  Multiple Vitamin (MULTIVITAMIN WITH MINERALS) TABS tablet Take 1 tablet by mouth daily.    Historical Provider, MD  pantoprazole (PROTONIX) 40 MG tablet Take 1 tablet (40 mg total) by mouth 2 (two) times daily. 10/19/14   Knox Royalty, NP   Allergies  Allergen Reactions  . Aspirin Anaphylaxis  . Doxycycline Anaphylaxis and Other (See Comments)    Joint damage also  . Imitrex [Sumatriptan Base] Other (See Comments)    Severe hypertension  . Iodinated Diagnostic Agents Anaphylaxis  . Lidocaine Anaphylaxis    Pt states she does well with Marcaine/Bupivicaine without problem  . Prednisone Other (See Comments)    She goes crazy  . Sulfa Antibiotics Anaphylaxis  . Sulfasalazine Anaphylaxis  . Sumatriptan Other (See Comments)    Felt like she was "having a stroke", heart rate and blood pressure "went through the roof"  . Tramadol Other (See Comments)    seizures  . Levofloxacin Other (See Comments)    Joints hurt  . Latex Rash  . Levofloxacin Rash and Other (See Comments)    Joints hurt  . Metrizamide Other (See Comments)  . Nsaids Rash and Other (See Comments)    GI bleed  . Tolmetin Rash    FAMILY HISTORY:  Family History  Problem Relation Age of Onset  . Hypertension Mother   . Hyperlipidemia Mother   . Heart failure Father   . Cancer Other     SOCIAL HISTORY:  reports that she has been smoking Cigarettes.  She has a 5 pack-year  smoking history. She has never used smokeless tobacco. She reports that she does not drink alcohol or use illicit drugs.  REVIEW OF SYSTEMS:  Unable to obtain as pt is encephalopathic.  SUBJECTIVE: Remains unresponsive on vent.  VITAL SIGNS: Pulse Rate:  [122-160] 122 (05/02 0015) Resp:  [14-20] 20 (05/02 0015) BP: (114-201)/(66-115) 114/66 mmHg (05/02 0015) SpO2:  [97 %-100 %] 98 % (05/02 0015) FiO2 (%):  [40 %-100 %] 40 % (05/02 0001) Weight:  [62.3 kg (137 lb 5.6 oz)] 62.3 kg (137 lb 5.6 oz) (05/02 0015) HEMODYNAMICS:   VENTILATOR SETTINGS: Vent Mode:  [-] PRVC FiO2 (%):  [40 %-100 %] 40 % Set Rate:  [14 bmp-20 bmp] 20 bmp Vt Set:  [440 mL-500 mL] 440 mL PEEP:  [5 cmH20] 5 cmH20 Plateau Pressure:  [20 cmH20-21 cmH20] 20 cmH20 INTAKE / OUTPUT: Intake/Output  05/01 0701 - 05/02 0700   I.V. (mL/kg) 1000 (16.1)   Total Intake(mL/kg) 1000 (16.1)   Net +1000         PHYSICAL EXAMINATION: General: Young female, in NAD. Neuro: Sedated, does not follow any commands. HEENT: [place]/AT. PERRL, sclerae anicteric. Cardiovascular: RRR, no M/R/G.  Lungs: Respirations even and unlabored.  CTA bilaterally, No W/R/R. Abdomen: BS x 4, soft, NT/ND.  Musculoskeletal: No gross deformities, no edema.  Skin: Intact, warm, no rashes.  LABS:  CBC  Recent Labs Lab 01/19/15 2316 01/19/15 2318  WBC 8.6  --   HGB 12.2 13.6  HCT 37.3 40.0  PLT 249  --    Coag's No results for input(s): APTT, INR in the last 168 hours. BMET  Recent Labs Lab 01/19/15 2316 01/19/15 2318  NA 137 140  K 3.3* 3.3*  CL 103 103  CO2 23  --   BUN 10 11  CREATININE 0.72 0.70  GLUCOSE 119* 119*   Electrolytes  Recent Labs Lab 01/19/15 2316 01/20/15 0033  CALCIUM 8.7*  --   PHOS  --  2.9   Sepsis Markers  Recent Labs Lab 01/19/15 2320  LATICACIDVEN 2.75*   ABG  Recent Labs Lab 01/19/15 2356  PHART 7.283*  PCO2ART 56.9*  PO2ART 360.0*   Liver Enzymes  Recent Labs Lab  01/19/15 2316  AST 31  ALT 17  ALKPHOS 89  BILITOT 0.4  ALBUMIN 3.9   Cardiac Enzymes No results for input(s): TROPONINI, PROBNP in the last 168 hours. Glucose  Recent Labs Lab 01/19/15 2236  GLUCAP 125*    Imaging Ct Head Wo Contrast  01/19/2015   CLINICAL DATA:  Found in car unresponsive  EXAM: CT HEAD WITHOUT CONTRAST  TECHNIQUE: Contiguous axial images were obtained from the base of the skull through the vertex without intravenous contrast.  COMPARISON:  12/10/2014  FINDINGS: There is no intracranial hemorrhage, mass or evidence of acute infarction. Gray matter and white matter are normal. The ventricles and basal cisterns appear unremarkable.  The bony structures are intact. The visible portions of the paranasal sinuses are clear.  IMPRESSION: Normal brain   Electronically Signed   By: Andreas Newport M.D.   On: 01/19/2015 23:40   Dg Chest Portable 1 View  01/19/2015   CLINICAL DATA:  Overdose, unresponsive  EXAM: PORTABLE CHEST - 1 VIEW  COMPARISON:  11/25/2014  FINDINGS: Endotracheal tube tip is 3.8 cm above the carina. There is minimal curvilinear atelectasis above the slightly elevated right hemidiaphragm. The lungs are otherwise clear. There is no large effusion. Hilar, mediastinal and cardiac contours appear unremarkable.  IMPRESSION: Satisfactory ET tube position. Minimal linear atelectatic appearing opacities in the right base.   Electronically Signed   By: Andreas Newport M.D.   On: 01/19/2015 23:27    ASSESSMENT / PLAN:  GASTROINTESTINAL A:   Acetaminophen overdose - with hx of intentional OD as part of suicide attempt GI prophylaxis Nutrition P:   Start N-acetylcysteine with loading dose following by maintenance infusion. Pharmacy to monitor NAC. Repeat acetaminophen level in AM. CMP in 24 hours to assess LFT's. SUP: Pantoprazole. NPO.  PULMONARY OETT 5/1 >>> A: Acute respiratory failure following acetaminophen overdose Respiratory acidosis Tobacco use  disorder P:   Full mechanical support, wean as able. ABG noted - increase MV in setting resp acidosis. VAP bundle. SBT in AM if able. CXR in AM. Tobacco cessation counseling.  CARDIOVASCULAR A:  Prolonged QTc on admission - resolved on subsequent EKG Mildly  elevated CK P:  Check magnesium. Monitor QTc. Bicarb pushes PRN for QT interval > 120 per poison control. EKG in AM.  RENAL A:   Hypokalemia Mild hypocalcemia P:   Potassium 40 mEq per tube. 1g Ca gluconate. NS @ 100. CMP in AM.  HEMATOLOGIC A:   VTE Prophylaxis P:  SCD's / Heparin. INR now. CBC in AM.  INFECTIOUS A:   No indication of infection P:   Monitor clinically.  ENDOCRINE A:   Mild hyperglycemia P:   SSI if glucose consistently > 180.  NEUROLOGIC A:   Acute metabolic / toxic encephalopathy UDS pending Hx psychiatric pseudoseizure, ETOH abuse, depression, anxiety P:   Sedation:  Propofol gtt / Midazolam PRN. RASS goal: 0 to -1. Daily WUA. Follow up on UDS. Neurology following. AED's per neurology. Thiamine / Folate. Will need psychiatric consult once extubated. Hold outpatient fioricet, soma, klonopin, prozac, neurontin, robaxin (has keppar listed on med list, but per EDP, pt not supposed to be taking).   Family updated: None.  Interdisciplinary Family Meeting v Palliative Care Meeting:  Due by: 01/26/15.   Montey Hora, [facility] Pager: [phone]  or [phone] 01/20/2015, 12:53 AM

## 2015-01-20 NOTE — Progress Notes (Signed)
CRITICAL VALUE ALERT  Critical value received:  Acetaminophen Level of 174   Date of notification:  01/20/15  Time of notification:  0429  Critical value read back:yes  Nurse who received alert:  Marcello Moores  MD notified (1st page):  Alva Garnet   Responding MD:  Alva Garnet  Time MD responded:  [phone]

## 2015-01-20 NOTE — Progress Notes (Signed)
MEDICATION RELATED CONSULT NOTE - INITIAL   Pharmacy Consult for Acetadote Indication: Tylenol overdose  Allergies  Allergen Reactions  . Aspirin Anaphylaxis  . Doxycycline Anaphylaxis and Other (See Comments)    Joint damage also  . Imitrex [Sumatriptan Base] Other (See Comments)    Severe hypertension  . Iodinated Diagnostic Agents Anaphylaxis  . Lidocaine Anaphylaxis    Pt states she does well with Marcaine/Bupivicaine without problem  . Prednisone Other (See Comments)    She goes crazy  . Sulfa Antibiotics Anaphylaxis  . Sulfasalazine Anaphylaxis  . Sumatriptan Other (See Comments)    Felt like she was "having a stroke", heart rate and blood pressure "went through the roof"  . Tramadol Other (See Comments)    seizures  . Levofloxacin Other (See Comments)    Joints hurt  . Latex Rash  . Levofloxacin Rash and Other (See Comments)    Joints hurt  . Metrizamide Other (See Comments)  . Nsaids Rash and Other (See Comments)    GI bleed  . Tolmetin Rash    Patient Measurements: Weight: 137 lb 5.6 oz (62.3 kg)  Vital Signs: BP: 104/71 mmHg (05/02 0300) Pulse Rate: 90 (05/02 0300)  Labs:  Recent Labs  01/19/15 2316 01/19/15 2318 01/20/15 0033  WBC 8.6  --   --   HGB 12.2 13.6  --   HCT 37.3 40.0  --   PLT 249  --   --   CREATININE 0.72 0.70  --   PHOS  --   --  2.9  ALBUMIN 3.9  --   --   PROT 6.5  --   --   AST 31  --   --   ALT 17  --   --   ALKPHOS 89  --   --   BILITOT 0.4  --   --    Medical History: Past Medical History  Diagnosis Date  . Migraines   . Endometriosis   . ETOH abuse     sober 3 1/2 years  . Anorexia   . Depression   . Anxiety   . DJD (degenerative joint disease) of cervical spine   . Psychiatric pseudoseizure   . PICC (peripherally inserted central catheter) in place     rt neck  . Seizures      Assessment: 49 y/o F with psychiatric history found unresponsive in car, ?history of APAP overdose in past, APAP level found to  be 140 (unknown ingestion time), salicylate level negative, LFTs WNL, no INR yet, other labs as above.   Plan:  -Acetadote 150 mg/kg load  -Acetadote infusion at 15 mg/kg/hr -F/U APAP ingestion labs this AM -F/U with CCM for LOT with Acetadote -F/U with poison control as needed/requested  Narda Bonds 01/20/2015,3:32 AM

## 2015-01-21 ENCOUNTER — Inpatient Hospital Stay (HOSPITAL_COMMUNITY): Payer: Medicaid Other

## 2015-01-21 DIAGNOSIS — J9600 Acute respiratory failure, unspecified whether with hypoxia or hypercapnia: Secondary | ICD-10-CM

## 2015-01-21 LAB — COMPREHENSIVE METABOLIC PANEL
ALBUMIN: 2.9 g/dL — AB (ref 3.5–5.0)
ALK PHOS: 61 U/L (ref 38–126)
ALT: 26 U/L (ref 14–54)
ALT: 23 U/L (ref 14–54)
ANION GAP: 9 (ref 5–15)
AST: 27 U/L (ref 15–41)
AST: 23 U/L (ref 15–41)
Albumin: 2.7 g/dL — ABNORMAL LOW (ref 3.5–5.0)
Alkaline Phosphatase: 57 U/L (ref 38–126)
Anion gap: 10 (ref 5–15)
BUN: 5 mg/dL — ABNORMAL LOW (ref 6–20)
BUN: <5 mg/dL — ABNORMAL LOW (ref 6–20)
CALCIUM: 8.4 mg/dL — AB (ref 8.9–10.3)
CHLORIDE: 109 mmol/L (ref 101–111)
CHLORIDE: 115 mmol/L — AB (ref 101–111)
CO2: 18 mmol/L — ABNORMAL LOW (ref 22–32)
CO2: 19 mmol/L — ABNORMAL LOW (ref 22–32)
Calcium: 8.8 mg/dL — ABNORMAL LOW (ref 8.9–10.3)
Creatinine, Ser: 0.63 mg/dL (ref 0.44–1.00)
Creatinine, Ser: 0.57 mg/dL (ref 0.44–1.00)
GFR calc Af Amer: >60 mL/min (ref >60)
GFR calc Af Amer: >60 mL/min (ref >60)
GFR calc non Af Amer: >60 mL/min (ref >60)
GFR calc non Af Amer: >60 mL/min (ref >60)
Glucose, Bld: 98 mg/dL (ref 70–99)
Glucose, Bld: 89 mg/dL (ref 70–99)
POTASSIUM: 3.7 mmol/L (ref 3.5–5.1)
Potassium: 4 mmol/L (ref 3.5–5.1)
SODIUM: 136 mmol/L (ref 135–145)
Sodium: 144 mmol/L (ref 135–145)
TOTAL PROTEIN: 5.6 g/dL — AB (ref 6.5–8.1)
Total Bilirubin: 0.1 mg/dL — ABNORMAL LOW (ref 0.3–1.2)
Total Bilirubin: 0.6 mg/dL (ref 0.3–1.2)
Total Protein: 5 g/dL — ABNORMAL LOW (ref 6.5–8.1)

## 2015-01-21 LAB — CBC
HCT: 34.8 % — ABNORMAL LOW (ref 36.0–46.0)
Hemoglobin: 11.4 g/dL — ABNORMAL LOW (ref 12.0–15.0)
MCH: 31.9 pg (ref 26.0–34.0)
MCHC: 32.8 g/dL (ref 30.0–36.0)
MCV: 97.5 fL (ref 78.0–100.0)
PLATELETS: 204 10*3/uL (ref 150–400)
RBC: 3.57 MIL/uL — ABNORMAL LOW (ref 3.87–5.11)
RDW: 12.9 % (ref 11.5–15.5)
WBC: 9.6 10*3/uL (ref 4.0–10.5)

## 2015-01-21 LAB — PROTIME-INR
INR: 1.09 (ref 0.00–1.49)
Prothrombin Time: 14.2 seconds (ref 11.6–15.2)

## 2015-01-21 LAB — SODIUM, URINE, RANDOM: Sodium, Ur: 112 mmol/L

## 2015-01-21 LAB — ACETAMINOPHEN LEVEL: Acetaminophen (Tylenol), Serum: 34 ug/mL — ABNORMAL HIGH (ref 10–30)

## 2015-01-21 LAB — OSMOLALITY, URINE: OSMOLALITY UR: 266 mosm/kg — AB (ref 390–1090)

## 2015-01-21 MED ORDER — FENTANYL CITRATE (PF) 100 MCG/2ML IJ SOLN
25.0000 ug | INTRAMUSCULAR | Status: DC | PRN
  Administered 2015-01-22 (×3): 25 ug via INTRAVENOUS
  Filled 2015-01-21 (×3): qty 2

## 2015-01-21 NOTE — Progress Notes (Signed)
PULMONARY / CRITICAL CARE MEDICINE   Name: [name] MRN: [id number] DOB: 1965-11-18    ADMISSION DATE:  01/19/2015 CONSULTATION DATE:  01/21/2015  REFERRING MD :  EDP  CHIEF COMPLAINT:  AMS  INITIAL PRESENTATION:  49 y.o. F brought to [facility] ED 5/1 after being found unresponsive by EMS.  In ED, remained unresponsive and was found to have tylenol level of 140. She was intubated and started on NAC.  Of note, she has hx of intentional acetaminophen overdose in the past as part of suicide attempt.   STUDIES:  CXR 5/1 >>> no acute process CT head 5/1 >>> no acute process  SIGNIFICANT EVENTS: 5/1 - brought to ED after being found unresponsive.  Admitted with tylenol overdose.   HISTORY OF PRESENT ILLNESS:  Pt is encephalopathic; therefore, this HPI is obtained from chart review. [name] is a 49 y.o. F with PMH as outlined below.  She was brought to [facility] ED 5/1 by EMS after a bystander saw her slumped over in her car in a nearby parking lot.  [employer] first responded and found her unresponsive with ? Convulsions.  [employer] found a brown paper bag with aluminum foil and a spoon in her car.  No street drugs were found.  She was brought to ED where she remained unresponsive to sternal rub or ammonia inhalants.  She was intubated for airway protection and PCCM was called for admission.  Of note, she has hx of admission for intentional tylenol overdose as part of suicide attempt.  Last admission here for this was in 2015 where tylenol level was 492.  On this admission, tylenol level was 140.  UDS is pending.  She was seen in consultation by neurology.  She apparently was just discharged from [place] facility earlier this month and while there, she had a an EEG performed for concern of seizure vs pseudoseizures.  EEG captured on of her typical events and demonstrated that it was not epileptic in nature.  CT head was negative and neuro recommended obtaining ammonia and depakote levels.  If pt has no  improvement by AM, they will obtain MRI and EEG.  SUBJECTIVE: Arousable but not following commands  VITAL SIGNS: Temp:  [97.8 F (36.6 C)-99.2 F (37.3 C)] 97.8 F (36.6 C) (05/03 0753) Pulse Rate:  [88-106] 97 (05/03 0900) Resp:  [15-20] 15 (05/03 0900) BP: (110-162)/(77-99) 157/97 mmHg (05/03 0900) SpO2:  [100 %] 100 % (05/03 0900) FiO2 (%):  [30 %-40 %] 30 % (05/03 0900) HEMODYNAMICS:   VENTILATOR SETTINGS: Vent Mode:  [-] CPAP;PSV FiO2 (%):  [30 %-40 %] 30 % Set Rate:  [20 bmp] 20 bmp Vt Set:  [440 mL] 440 mL PEEP:  [5 cmH20] 5 cmH20 Pressure Support:  [5 cmH20] 5 cmH20 Plateau Pressure:  [16 cmH20-18 cmH20] 17 cmH20 INTAKE / OUTPUT: Intake/Output      05/02 0701 - 05/03 0700 05/03 0701 - 05/04 0700   I.V. (mL/kg) 2738.2 (44) 493.6 (7.9)   Total Intake(mL/kg) 2738.2 (44) 493.6 (7.9)   Urine (mL/kg/hr) 1310 (0.9) 1360 (5.5)   Total Output 1310 1360   Net +1428.2 -866.4          PHYSICAL EXAMINATION: General: Older than stated age female, in NAD. Neuro: Off sedation, agitated, moving all ext to pain but not command. HEENT: [place]/AT. PERRL, sclerae anicteric. Cardiovascular: RRR, no M/R/G.  Lungs: Respirations even and unlabored.  CTA bilaterally, No W/R/R. Abdomen: BS x 4, soft, NT/ND.  Musculoskeletal: No gross deformities,  no edema.  Skin: Intact, warm, no rashes.  LABS:  CBC  Recent Labs Lab 01/19/15 2316 01/19/15 2318 01/21/15 0120  WBC 8.6  --  9.6  HGB 12.2 13.6 11.4*  HCT 37.3 40.0 34.8*  PLT 249  --  204   Coag's  Recent Labs Lab 01/20/15 0228  INR 1.01   BMET  Recent Labs Lab 01/19/15 2316 01/19/15 2318 01/20/15 0228 01/21/15 0120  NA 137 140 138 136  K 3.3* 3.3* 3.7 4.0  CL 103 103 107 109  CO2 23  --  22 18*  BUN 10 11 8  5*  CREATININE 0.72 0.70 0.55 0.63  GLUCOSE 119* 119* 124* 98   Electrolytes  Recent Labs Lab 01/19/15 2316 01/20/15 0033 01/20/15 0228 01/21/15 0120  CALCIUM 8.7*  --  7.8* 8.4*  MG  --   --  1.7   --   PHOS  --  2.9 4.1  --    Sepsis Markers  Recent Labs Lab 01/19/15 2320  LATICACIDVEN 2.75*   ABG  Recent Labs Lab 01/19/15 2356  PHART 7.283*  PCO2ART 56.9*  PO2ART 360.0*   Liver Enzymes  Recent Labs Lab 01/19/15 2316 01/20/15 0228 01/21/15 0120  AST 31 24 27   ALT 17 16 26   ALKPHOS 89 68 57  BILITOT 0.4 0.3 0.1*  ALBUMIN 3.9 3.0* 2.7*   Cardiac Enzymes No results for input(s): TROPONINI, PROBNP in the last 168 hours. Glucose  Recent Labs Lab 01/19/15 2236  GLUCAP 125*   Imaging No results found.  ASSESSMENT / PLAN:  GASTROINTESTINAL A:   Acetaminophen overdose - with hx of intentional OD as part of suicide attempt GI prophylaxis Nutrition P:   Start N-acetylcysteine with loading dose following by maintenance infusion. Pharmacy to monitor NAC. Repeat acetaminophen level in AM. CMP in 24 hours to assess LFT's. SUP: Pantoprazole. Begin TF per nutrition. Ammonia level in AM.  PULMONARY OETT 5/1 >>> A: Acute respiratory failure following acetaminophen overdose Respiratory acidosis Tobacco use disorder P:   Begin PS trials but no extubation due to mental status. ABG noted - increase MV in setting resp acidosis. VAP bundle. SBT in AM if able. CXR in AM. Tobacco cessation counseling.  CARDIOVASCULAR A:  Prolonged QTc on admission - resolved on subsequent EKG Mildly elevated CK P:  Check magnesium. Monitor QTc. Bicarb pushes PRN for QT interval > 120 per poison control.  RENAL A:   Hypokalemia Mild hypocalcemia High UOP with low specific gravity x1. P:   1g Ca gluconate. KVO IVF CMP in AM. Replace electrolytes as indicated. If UOP remains high with patient becoming hypotensive or tachy will reconsider but I see no reason to suspect DI here.  HEMATOLOGIC A:   VTE Prophylaxis P:  SCD's / Heparin. INR WNL. CBC in AM.  INFECTIOUS A:   No indication of infection P:   Monitor clinically.  ENDOCRINE A:   Mild  hyperglycemia P:   SSI if glucose consistently > 180.  NEUROLOGIC A:   Acute metabolic / toxic encephalopathy UDS pending Hx psychiatric pseudoseizure, ETOH abuse, depression, anxiety P:   Sedation:  Propofol gtt / Midazolam PRN.  D/C propofol after MRI and rely more on versed. RASS goal: 0 to -1. Daily WUA. Neurology following, appreciate input. AED's per neurology. Thiamine / Folate. Will need psychiatric consult once extubated. Hold outpatient fioricet, soma, klonopin, prozac, neurontin, robaxin (has keppar listed on med list, but per EDP, pt not supposed to be taking).  Family updated: None.  The patient is critically ill with multiple organ systems failure and requires high complexity decision making for assessment and support, frequent evaluation and titration of therapies, application of advanced monitoring technologies and extensive interpretation of multiple databases.   Critical Care Time devoted to patient care services described in this note is  35  Minutes. This time reflects time of care of this signee Dr Jennet Maduro. This critical care time does not reflect procedure time, or teaching time or supervisory time of PA/NP/Med student/Med Resident etc but could involve care discussion time.  Rush Farmer, M.D. [facility] Pulmonary/Critical Care Medicine. Pager: [phone]. After hours pager: [phone].  01/21/2015, 11:00 AM

## 2015-01-21 NOTE — Progress Notes (Signed)
[place] Progress Note Patient Name: [name] DOB: 31-Oct-1965 MRN: [id number]   Date of Service  01/21/2015  HPI/Events of Note  n o MRI planned, weaning well  eICU Interventions  Extubation as goal, assess mechanics     Intervention Category Major Interventions: Respiratory failure - evaluation and management  Raylene Miyamoto. 01/21/2015, 5:37 PM

## 2015-01-21 NOTE — Progress Notes (Signed)
[place] Progress Note Patient Name: [name] DOB: 11-Oct-1965 MRN: [id number]   Date of Service  01/21/2015  HPI/Events of Note  Output high At risk DI  eICU Interventions  Assess urine osm Urine na for iatrogenixc      Intervention Category Intermediate Interventions: Diagnostic test evaluation  Raylene Miyamoto. 01/21/2015, 5:18 PM

## 2015-01-21 NOTE — Procedures (Signed)
Extubation Procedure Note  Patient Details:   Name: [name] DOB: 1966-05-22 MRN: [id number]   Airway Documentation:     Evaluation  O2 sats: 528 Complications: No apparent complications Patient did tolerate procedure well. Bilateral Breath Sounds: Clear, Diminished Suctioning: Airway, Oral Yes.  Deep oral sxn done.  Per MD order ok to extubate.  Pt has good cough/gag.  Pt has positive cuff leak. Pt is awake and can follow commands.  Extubated pt to 2L  [place].  BBS Clr Dim. No stridor. Pt tol well. Pt in no distress  [name] 01/21/2015, 5:49 PM

## 2015-01-21 NOTE — Care Management Note (Signed)
Case Management Note  Patient Details  Name: [name] MRN: [id number] Date of Birth: 1966/04/20  Subjective/Objective:     Found unresponsive-assumed intentional overdose of Tylenol. Has PMH of same.               Action/Plan:   Anticipate that pt will have psych eval--d/c plan will depend on that recommendation.    Expected Discharge Date:  01/28/15               Expected Discharge Plan:  [place] Referral:  Clinical Social Work   Angelina Sheriff, RN 01/21/2015, 2:42 PM  [phone]

## 2015-01-21 NOTE — Progress Notes (Signed)
MEDICATION RELATED CONSULT NOTE  Pharmacy Consult for Acetadote Indication: Tylenol overdose  Vital Signs: Temp: 98.2 F (36.8 C) (05/03 1140) Temp Source: Axillary (05/03 1140) BP: 120/98 mmHg (05/03 1200) Pulse Rate: 115 (05/03 1200)  Labs:  Recent Labs  01/19/15 2316 01/19/15 2318 01/20/15 0033 01/20/15 0228 01/21/15 0120 01/21/15 1245  WBC 8.6  --   --   --  9.6  --   HGB 12.2 13.6  --   --  11.4*  --   HCT 37.3 40.0  --   --  34.8*  --   PLT 249  --   --   --  204  --   CREATININE 0.72 0.70  --  0.55 0.63 0.57  MG  --   --   --  1.7  --   --   PHOS  --   --  2.9 4.1  --   --   ALBUMIN 3.9  --   --  3.0* 2.7* 2.9*  PROT 6.5  --   --  5.1* 5.0* 5.6*  AST 31  --   --  24 27 23   ALT 17  --   --  16 26 23   ALKPHOS 89  --   --  68 57 61  BILITOT 0.4  --   --  0.3 0.1* 0.6   Assessment: 49 y/o Female with APAP overdose for Acetadote.  Acetaminophen level is now < 10, AST/ALT= 23/23, INR= 1.09.  Spoke to [employer] and ok to discontinue Acetadote. No further acetaminophen levels of LFTs are needed. Dr. Nelda Marseille was also informed and ok to discontinue acetadote     Plan:  -Acetadote has been discontinued (already done per RN) -No further acetaminophen levels or LFTs needed -Will sign off. Please contact pharmacy with any other needs  Thank you,  Hildred Laser, Pharm D 01/21/2015 2:02 PM

## 2015-01-21 NOTE — Progress Notes (Signed)
[place] Progress Note Patient Name: [name] DOB: 1965/12/22 MRN: [id number]   Date of Service  01/21/2015  HPI/Events of Note  Extubated, start ice chips,  Chronic pain fent  eICU Interventions       Intervention Category Minor Interventions: Agitation / anxiety - evaluation and management  Raylene Miyamoto. 01/21/2015, 6:59 PM

## 2015-01-21 NOTE — Progress Notes (Signed)
Subjective: Patient intubated breathing over the vent.  Purposefully attempting to pull ET out and follows simple commands.   Objective: Current vital signs: BP 157/80 mmHg  Pulse 106  Temp(Src) 97.8 F (36.6 C) (Axillary)  Resp 20  Wt 62.3 kg (137 lb 5.6 oz)  SpO2 100% Vital signs in last 24 hours: Temp:  [97.8 F (36.6 C)-99.2 F (37.3 C)] 97.8 F (36.6 C) (05/03 0753) Pulse Rate:  [88-106] 106 (05/03 0823) Resp:  [16-20] 20 (05/03 0800) BP: (104-162)/(68-99) 157/80 mmHg (05/03 0823) SpO2:  [100 %] 100 % (05/03 0823) FiO2 (%):  [30 %-40 %] 30 % (05/03 0823)  Intake/Output from previous day: 05/02 0701 - 05/03 0700 In: 2738.2 [I.V.:2738.2] Out: 1310 [Urine:1310] Intake/Output this shift: Total I/O In: 246.8 [I.V.:246.8] Out: 650 [Urine:650] Nutritional status: Diet NPO time specified  Neurologic Exam:  Mental Status: Intubated, breathing over the vent. On no sedation.  Spontaneously and purposefully attempting to pull ET out and follows simple commands such as raising her arms and legs.  Cranial Nerves: II:  No blink to threat.  pupils equal, round, reactive to light and accommodation III,IV, VI: ptosis not present, minimal spontaneous eye movement but occulocephalic relfex intact.  V,VII: face symmetric, facial light touch sensation normal bilaterally VIII: hearing normal bilaterally IX,X: uvula rises symmetrically XI: bilateral shoulder shrug Motor: Moving all extremities antigravity both spontaneously and purposefully Sensory: Pinprick and light touch intact throughout, bilaterally Deep Tendon Reflexes:  Right: Upper Extremity   Left: Upper extremity   biceps (C-5 to C-6) 2/4   biceps (C-5 to C-6) 2/4 tricep (C7) 2/4    triceps (C7) 2/4 Brachioradialis (C6) 2/4  Brachioradialis (C6) 2/4  Lower Extremity Lower Extremity  quadriceps (L-2 to L-4) 2/4   quadriceps (L-2 to L-4) 2/4 Achilles (S1) 2/4   Achilles (S1) 2/4  Plantars: Right: downgoing   Left:  downgoing Cerebellar: No dysmetria noted when attempting to pull out ET tube.     Lab Results: Basic Metabolic Panel:  Recent Labs Lab 01/19/15 2316 01/19/15 2318 01/20/15 0033 01/20/15 0228 01/21/15 0120  NA 137 140  --  138 136  K 3.3* 3.3*  --  3.7 4.0  CL 103 103  --  107 109  CO2 23  --   --  22 18*  GLUCOSE 119* 119*  --  124* 98  BUN 10 11  --  8 5*  CREATININE 0.72 0.70  --  0.55 0.63  CALCIUM 8.7*  --   --  7.8* 8.4*  MG  --   --   --  1.7  --   PHOS  --   --  2.9 4.1  --     Liver Function Tests:  Recent Labs Lab 01/19/15 2316 01/20/15 0228 01/21/15 0120  AST 31 24 27   ALT 17 16 26   ALKPHOS 89 68 57  BILITOT 0.4 0.3 0.1*  PROT 6.5 5.1* 5.0*  ALBUMIN 3.9 3.0* 2.7*   No results for input(s): LIPASE, AMYLASE in the last 168 hours.  Recent Labs Lab 01/19/15 2316  AMMONIA 27    CBC:  Recent Labs Lab 01/19/15 2316 01/19/15 2318 01/21/15 0120  WBC 8.6  --  9.6  NEUTROABS 2.7  --   --   HGB 12.2 13.6 11.4*  HCT 37.3 40.0 34.8*  MCV 95.6  --  97.5  PLT 249  --  204    Cardiac Enzymes:  Recent Labs Lab 01/19/15 2327  CKTOTAL 345*  Lipid Panel:  Recent Labs Lab 01/20/15 0228  TRIG 257*    CBG:  Recent Labs Lab 01/19/15 2236  GLUCAP 125*    Microbiology: Results for orders placed or performed during the hospital encounter of 01/19/15  MRSA PCR Screening     Status: None   Collection Time: 01/20/15  1:29 AM  Result Value Ref Range Status   MRSA by PCR NEGATIVE NEGATIVE Final    Comment:        The GeneXpert MRSA Assay (FDA approved for NASAL specimens only), is one component of a comprehensive MRSA colonization surveillance program. It is not intended to diagnose MRSA infection nor to guide or monitor treatment for MRSA infections.     Coagulation Studies:  Recent Labs  01/20/15 0228  LABPROT 13.4  INR 1.01    Imaging: Ct Head Wo Contrast  01/19/2015   CLINICAL DATA:  Found in car unresponsive  EXAM:  CT HEAD WITHOUT CONTRAST  TECHNIQUE: Contiguous axial images were obtained from the base of the skull through the vertex without intravenous contrast.  COMPARISON:  12/10/2014  FINDINGS: There is no intracranial hemorrhage, mass or evidence of acute infarction. Gray matter and white matter are normal. The ventricles and basal cisterns appear unremarkable.  The bony structures are intact. The visible portions of the paranasal sinuses are clear.  IMPRESSION: Normal brain   Electronically Signed   By: Andreas Newport M.D.   On: 01/19/2015 23:40   Portable Chest Xray  01/21/2015   CLINICAL DATA:  Respiratory failure .  EXAM: PORTABLE CHEST - 1 VIEW  COMPARISON:  01/19/2015.  FINDINGS: Endotracheal tube in stable position. Interim placement of NG tube, its tip is projected over the stomach. Mediastinum hilar structures are normal. Bibasilar atelectasis and/or infiltrates again noted. Small bilateral pleural effusions cannot be excluded. No pneumothorax.  IMPRESSION: 1. Interim placement of NG tube, its tip is below the left hemidiaphragm. Endotracheal tube in stable position . 2. Stable bibasilar atelectasis and/or infiltrates .   Electronically Signed   By: Marcello Moores  Register   On: 01/21/2015 07:25   Dg Chest Portable 1 View  01/19/2015   CLINICAL DATA:  Overdose, unresponsive  EXAM: PORTABLE CHEST - 1 VIEW  COMPARISON:  11/25/2014  FINDINGS: Endotracheal tube tip is 3.8 cm above the carina. There is minimal curvilinear atelectasis above the slightly elevated right hemidiaphragm. The lungs are otherwise clear. There is no large effusion. Hilar, mediastinal and cardiac contours appear unremarkable.  IMPRESSION: Satisfactory ET tube position. Minimal linear atelectatic appearing opacities in the right base.   Electronically Signed   By: Andreas Newport M.D.   On: 01/19/2015 23:27    Medications:  Scheduled: . antiseptic oral rinse  7 mL Mouth Rinse QID  . chlorhexidine  15 mL Mouth Rinse BID  . folic acid  1  mg Intravenous Daily  . heparin  5,000 Units Subcutaneous 3 times per day  . metoCLOPramide (REGLAN) injection  10 mg Intravenous Once  . pantoprazole (PROTONIX) IV  40 mg Intravenous Daily  . thiamine  100 mg Intravenous Daily   Continuous: . sodium chloride 100 mL/hr at 01/21/15 0508  . acetylcysteine 15 mg/kg/hr (01/20/15 0228)  . propofol 50 mcg/kg/min (01/19/15 2302)  . propofol (DIPRIVAN) infusion Stopped (01/20/15 0100)   QAS:[id number], midazolam, propofol, sodium bicarbonate  Etta Quill PA-C [facility] [phone]  01/21/2015, 9:16 AM  Patient seen and examined.  Clinical course and management discussed.  Necessary edits performed.  I agree with the above.  Assessment and plan of care developed and discussed below.     Assessment/Plan: 49 YO female S/P Tylenol OD. Patient recently with discontinuation of sedation now moving spontaneously and purposefully following simple commands (improved).  Initial CT head showed no acute abnormalities. Ammonia level WNL. EEG is consistent with alpha coma.  No subclinical seizure activity noted.     Recommendations: 1.  Will continue to follow clinically and only do further imaging unless patient does not return to baseline.     Alexis Goodell, MD [facility] [phone]  01/21/2015  3:21 PM

## 2015-01-21 NOTE — Progress Notes (Signed)
MEDICATION RELATED CONSULT NOTE  Pharmacy Consult for Acetadote Indication: Tylenol overdose  Vital Signs: Temp: 98.6 F (37 C) (05/03 0008) Temp Source: Axillary (05/03 0008) BP: 135/89 mmHg (05/03 0300) Pulse Rate: 92 (05/03 0300)  Labs:  Recent Labs  01/19/15 2316 01/19/15 2318 01/20/15 0033 01/20/15 0228 01/21/15 0120  WBC 8.6  --   --   --  9.6  HGB 12.2 13.6  --   --  11.4*  HCT 37.3 40.0  --   --  34.8*  PLT 249  --   --   --  204  CREATININE 0.72 0.70  --  0.55 0.63  MG  --   --   --  1.7  --   PHOS  --   --  2.9 4.1  --   ALBUMIN 3.9  --   --  3.0* 2.7*  PROT 6.5  --   --  5.1* 5.0*  AST 31  --   --  24 27  ALT 17  --   --  16 26  ALKPHOS 89  --   --  68 57  BILITOT 0.4  --   --  0.3 0.1*   Assessment: 49 y/o Female with APAP overdose for Acetadote.  Spoke with [employer]. Since APAP level still detectable recommend continuing Acetadote.       Plan:  Recheck APAP level and LFTs at 1000 If APAP level undetectable and LFTs remain WNL, discontinue Acetadote  Matt Delpizzo, Bronson Curb 01/21/2015,3:45 AM

## 2015-01-22 ENCOUNTER — Inpatient Hospital Stay (HOSPITAL_COMMUNITY): Payer: Medicaid Other

## 2015-01-22 DIAGNOSIS — T391X4D Poisoning by 4-Aminophenol derivatives, undetermined, subsequent encounter: Secondary | ICD-10-CM

## 2015-01-22 DIAGNOSIS — F411 Generalized anxiety disorder: Secondary | ICD-10-CM

## 2015-01-22 DIAGNOSIS — F332 Major depressive disorder, recurrent severe without psychotic features: Secondary | ICD-10-CM

## 2015-01-22 DIAGNOSIS — T391X2A Poisoning by 4-Aminophenol derivatives, intentional self-harm, initial encounter: Principal | ICD-10-CM

## 2015-01-22 DIAGNOSIS — R45851 Suicidal ideations: Secondary | ICD-10-CM

## 2015-01-22 LAB — BASIC METABOLIC PANEL
Anion gap: 13 (ref 5–15)
CHLORIDE: 105 mmol/L (ref 101–111)
CO2: 24 mmol/L (ref 22–32)
CREATININE: 0.59 mg/dL (ref 0.44–1.00)
Calcium: 9.5 mg/dL (ref 8.9–10.3)
GFR calc Af Amer: >60 mL/min (ref >60)
GFR calc non Af Amer: >60 mL/min (ref >60)
GLUCOSE: 89 mg/dL (ref 70–99)
Potassium: 3.1 mmol/L — ABNORMAL LOW (ref 3.5–5.1)
Sodium: 142 mmol/L (ref 135–145)

## 2015-01-22 LAB — CBC
HCT: 39.8 % (ref 36.0–46.0)
Hemoglobin: 13.2 g/dL (ref 12.0–15.0)
MCH: 31.4 pg (ref 26.0–34.0)
MCHC: 33.2 g/dL (ref 30.0–36.0)
MCV: 94.5 fL (ref 78.0–100.0)
PLATELETS: 237 10*3/uL (ref 150–400)
RBC: 4.21 MIL/uL (ref 3.87–5.11)
RDW: 12.6 % (ref 11.5–15.5)
WBC: 9.1 10*3/uL (ref 4.0–10.5)

## 2015-01-22 LAB — POCT I-STAT 3, ART BLOOD GAS (G3+)
Acid-Base Excess: 4 mmol/L — ABNORMAL HIGH (ref 0.0–2.0)
Bicarbonate: 28.4 mEq/L — ABNORMAL HIGH (ref 20.0–24.0)
O2 Saturation: 93 %
PO2 ART: 64 mmHg — AB (ref 80.0–100.0)
TCO2: 30 mmol/L (ref 0–100)
pCO2 arterial: 41.1 mmHg (ref 35.0–45.0)
pH, Arterial: 7.448 (ref 7.350–7.450)

## 2015-01-22 LAB — MAGNESIUM: Magnesium: 1.9 mg/dL (ref 1.7–2.4)

## 2015-01-22 LAB — PHOSPHORUS: PHOSPHORUS: 2.7 mg/dL (ref 2.5–4.6)

## 2015-01-22 MED ORDER — FENTANYL CITRATE (PF) 100 MCG/2ML IJ SOLN
25.0000 ug | Freq: Once | INTRAMUSCULAR | Status: AC
  Administered 2015-01-22: 25 ug via INTRAVENOUS
  Filled 2015-01-22: qty 2

## 2015-01-22 MED ORDER — GABAPENTIN 300 MG PO CAPS
300.0000 mg | ORAL_CAPSULE | Freq: Three times a day (TID) | ORAL | Status: DC
  Administered 2015-01-22 – 2015-02-06 (×46): 300 mg via ORAL
  Filled 2015-01-22 (×47): qty 1

## 2015-01-22 MED ORDER — PANTOPRAZOLE SODIUM 40 MG PO TBEC
40.0000 mg | DELAYED_RELEASE_TABLET | Freq: Every day | ORAL | Status: DC
  Administered 2015-01-22 – 2015-02-03 (×13): 40 mg via ORAL
  Filled 2015-01-22 (×13): qty 1

## 2015-01-22 MED ORDER — FOLIC ACID 1 MG PO TABS
1.0000 mg | ORAL_TABLET | Freq: Every day | ORAL | Status: DC
  Administered 2015-01-23 – 2015-02-06 (×14): 1 mg via ORAL
  Filled 2015-01-22 (×15): qty 1

## 2015-01-22 MED ORDER — VITAL HIGH PROTEIN PO LIQD
1000.0000 mL | ORAL | Status: DC
  Filled 2015-01-22 (×2): qty 1000

## 2015-01-22 MED ORDER — PHENOL 1.4 % MT LIQD
1.0000 | OROMUCOSAL | Status: DC | PRN
  Administered 2015-01-22: 1 via OROMUCOSAL
  Filled 2015-01-22: qty 177

## 2015-01-22 MED ORDER — FLUOXETINE HCL 10 MG PO CAPS
10.0000 mg | ORAL_CAPSULE | Freq: Every day | ORAL | Status: DC
  Administered 2015-01-22 – 2015-01-30 (×9): 10 mg via ORAL
  Filled 2015-01-22 (×9): qty 1

## 2015-01-22 MED ORDER — ONDANSETRON HCL 4 MG/2ML IJ SOLN
4.0000 mg | Freq: Four times a day (QID) | INTRAMUSCULAR | Status: DC | PRN
  Administered 2015-01-22: 4 mg via INTRAVENOUS
  Filled 2015-01-22: qty 2

## 2015-01-22 MED ORDER — ONDANSETRON HCL 4 MG/2ML IJ SOLN
4.0000 mg | Freq: Four times a day (QID) | INTRAMUSCULAR | Status: DC | PRN
  Administered 2015-01-22 – 2015-01-26 (×12): 4 mg via INTRAVENOUS
  Filled 2015-01-22 (×12): qty 2

## 2015-01-22 MED ORDER — VITAMIN B-1 100 MG PO TABS
100.0000 mg | ORAL_TABLET | Freq: Every day | ORAL | Status: DC
  Administered 2015-01-23 – 2015-02-06 (×15): 100 mg via ORAL
  Filled 2015-01-22 (×15): qty 1

## 2015-01-22 MED ORDER — MAGNESIUM SULFATE 2 GM/50ML IV SOLN
2.0000 g | Freq: Once | INTRAVENOUS | Status: AC
  Administered 2015-01-22: 2 g via INTRAVENOUS
  Filled 2015-01-22: qty 50

## 2015-01-22 MED ORDER — ALPRAZOLAM 0.25 MG PO TABS
0.2500 mg | ORAL_TABLET | Freq: Three times a day (TID) | ORAL | Status: DC | PRN
  Administered 2015-01-22 – 2015-01-23 (×5): 0.25 mg via ORAL
  Filled 2015-01-22 (×6): qty 1

## 2015-01-22 MED ORDER — MENTHOL 3 MG MT LOZG
1.0000 | LOZENGE | OROMUCOSAL | Status: DC | PRN
  Administered 2015-01-22: 3 mg via ORAL
  Filled 2015-01-22: qty 9

## 2015-01-22 MED ORDER — JEVITY 1.2 CAL PO LIQD
1000.0000 mL | ORAL | Status: DC
Start: 2015-01-22 — End: 2015-01-23
  Filled 2015-01-22 (×3): qty 1000

## 2015-01-22 MED ORDER — POTASSIUM CHLORIDE CRYS ER 20 MEQ PO TBCR
40.0000 meq | EXTENDED_RELEASE_TABLET | Freq: Once | ORAL | Status: AC
  Administered 2015-01-22: 40 meq via ORAL
  Filled 2015-01-22: qty 2

## 2015-01-22 NOTE — Progress Notes (Signed)
[place] Progress Note Patient Name: [name] DOB: 10/13/1965 MRN: [id number]   Date of Service  01/22/2015  HPI/Events of Note  n Throat pain  eICU Interventions  zofran Chloraseptic spray     Intervention Category Minor Interventions: Routine modifications to care plan (e.g. PRN medications for pain, fever)  Raylene Miyamoto. 01/22/2015, 9:20 PM

## 2015-01-22 NOTE — Progress Notes (Signed)
[place] Progress Note Patient Name: [name] DOB: 05/06/1966 MRN: [id number]   Date of Service  01/22/2015  HPI/Events of Note    eICU Interventions  PRN cepacol, xanax, zofran     Intervention Category Intermediate Interventions: Other:  Merton Border 01/22/2015, 4:02 AM

## 2015-01-22 NOTE — Progress Notes (Signed)
PULMONARY / CRITICAL CARE MEDICINE   Name: [name] MRN: [id number] DOB: 1966/06/12    ADMISSION DATE:  01/19/2015 CONSULTATION DATE:  01/22/2015  REFERRING MD :  EDP  CHIEF COMPLAINT:  AMS  INITIAL PRESENTATION:  [name] is a 49 y.o. F with PMH as outlined below.  She was brought to [facility] ED 5/1 by EMS after a bystander saw her slumped over in her car in a nearby parking lot.  [facility] first responded and found her unresponsive with ? Convulsions.  [employer] found a brown paper bag with aluminum foil and a spoon in her car.  No street drugs were found.  She was brought to ED where she remained unresponsive to sternal rub or ammonia inhalants.  She was intubated for airway protection and PCCM was called for admission. Of note, she has hx of admission for intentional tylenol overdose as part of suicide attempt.  Last admission here for this was in 2015 where tylenol level was 492.  On this admission, tylenol level was 140.  UDS is pending.  She was seen in consultation by neurology.  She apparently was just discharged from [facility] facility earlier this month and while there, she had a an EEG performed for concern of seizure vs pseudoseizures.  EEG captured on of her typical events and demonstrated that it was not epileptic in nature.  CT head was negative and neuro recommended obtaining ammonia and depakote levels.  If pt has no improvement by AM, they will obtain MRI and EEG.     STUDIES:/EVENTS CXR 5/1 >>> no acute process CT head 5/1 >>> no acute process 5/1 - brought to ED after being found unresponsive.  Admitted with tylenol overdose. 01/21/15: Extubated by eMD   SUBJECTIVE/OVERNIGHT/INTERVAL HX 01/22/15: Neuro eval today patient back to baseline. Tylenol level rapidly reduced to < 10 by 13.00h yesterday and per poison control dc acetadote done yesterday  VITAL SIGNS: Temp:  [98.1 F (36.7 C)-99.8 F (37.7 C)] 98.1 F (36.7 C) (05/04 0732) Pulse Rate:  [76-119] 76 (05/04  1000) Resp:  [13-33] 16 (05/04 1000) BP: (103-181)/(67-109) 161/85 mmHg (05/04 1000) SpO2:  [95 %-100 %] 95 % (05/04 1000) FiO2 (%):  [30 %] 30 % (05/03 1700) HEMODYNAMICS:   VENTILATOR SETTINGS: Vent Mode:  [-] CPAP;PSV FiO2 (%):  [30 %] 30 % PEEP:  [5 cmH20] 5 cmH20 Pressure Support:  [5 cmH20] 5 cmH20 INTAKE / OUTPUT: Intake/Output      05/03 0701 - 05/04 0700 05/04 0701 - 05/05 0700   P.O. 600    I.V. (mL/kg) 806.8 (13)    Total Intake(mL/kg) 1406.8 (22.6)    Urine (mL/kg/hr) 6660 (4.5)    Emesis/NG output 700 (0.5)    Total Output 7360     Net -5953.2            PHYSICAL EXAMINATION: General: Older than stated age female, in NAD. Neuro: Lyuing in bed. Deconditioned. AXOX3. Speech normal.  HEENT: Kerr/AT. PERRL, sclerae anicteric. Cardiovascular: RRR, no M/R/G.  Lungs: Respirations even and unlabored.  CTA bilaterally, No W/R/R. Abdomen: BS x 4, soft, NT/ND.  Musculoskeletal: No gross deformities, no edema.  Skin: Intact, warm, no rashes.  LABS: PULMONARY  Recent Labs Lab 01/19/15 2318 01/19/15 2356 01/22/15 0343  PHART  --  7.283* 7.448  PCO2ART  --  56.9* 41.1  PO2ART  --  360.0* 64.0*  HCO3  --  26.9* 28.4*  TCO2 21 29 30   O2SAT  --  100.0 93.0  CBC  Recent Labs Lab 01/19/15 2316 01/19/15 2318 01/21/15 0120 01/22/15 0240  HGB 12.2 13.6 11.4* 13.2  HCT 37.3 40.0 34.8* 39.8  WBC 8.6  --  9.6 9.1  PLT 249  --  204 237    COAGULATION  Recent Labs Lab 01/20/15 0228 01/21/15 1245  INR 1.01 1.09    CARDIAC  No results for input(s): TROPONINI in the last 168 hours. No results for input(s): PROBNP in the last 168 hours.   CHEMISTRY  Recent Labs Lab 01/19/15 2316 01/19/15 2318 01/20/15 0033 01/20/15 0228 01/21/15 0120 01/21/15 1245 01/22/15 0240  NA 137 140  --  138 136 144 142  K 3.3* 3.3*  --  3.7 4.0 3.7 3.1*  CL 103 103  --  107 109 115* 105  CO2 23  --   --  22 18* 19* 24  GLUCOSE 119* 119*  --  124* 98 89 89  BUN 10 11   --  8 5* <5* <5*  CREATININE 0.72 0.70  --  0.55 0.63 0.57 0.59  CALCIUM 8.7*  --   --  7.8* 8.4* 8.8* 9.5  MG  --   --   --  1.7  --   --  1.9  PHOS  --   --  2.9 4.1  --   --  2.7   Estimated Creatinine Clearance: 72.8 mL/min (by C-G formula based on Cr of 0.59).   LIVER  Recent Labs Lab 01/19/15 2316 01/20/15 0228 01/21/15 0120 01/21/15 1245  AST 31 24 27 23   ALT 17 16 26 23   ALKPHOS 89 68 57 61  BILITOT 0.4 0.3 0.1* 0.6  PROT 6.5 5.1* 5.0* 5.6*  ALBUMIN 3.9 3.0* 2.7* 2.9*  INR  --  1.01  --  1.09     INFECTIOUS  Recent Labs Lab 01/19/15 2320  LATICACIDVEN 2.75*     ENDOCRINE CBG (last 3)   Recent Labs  01/19/15 2236  GLUCAP 125*         IMAGING x48h Dg Chest Port 1 View  01/22/2015   CLINICAL DATA:  Common a overdose, respiratory failure  EXAM: PORTABLE CHEST - 1 VIEW  COMPARISON:  Portable chest x-ray of Jan 21, 2015  FINDINGS: There is been interval extubation of the trachea and esophagus. The lungs are reasonably well inflated. There is minimal subsegmental atelectasis at the bases. There is no pleural effusion. The cardiac silhouette is top-normal in size. The pulmonary vascularity is not engorged. The bony thorax is unremarkable.  IMPRESSION: Interval extubation of the trachea and esophagus. Minimal persistent bibasilar subsegmental atelectasis.   Electronically Signed   By: David  Martinique M.D.   On: 01/22/2015 07:36   Portable Chest Xray  01/21/2015   CLINICAL DATA:  Respiratory failure .  EXAM: PORTABLE CHEST - 1 VIEW  COMPARISON:  01/19/2015.  FINDINGS: Endotracheal tube in stable position. Interim placement of NG tube, its tip is projected over the stomach. Mediastinum hilar structures are normal. Bibasilar atelectasis and/or infiltrates again noted. Small bilateral pleural effusions cannot be excluded. No pneumothorax.  IMPRESSION: 1. Interim placement of NG tube, its tip is below the left hemidiaphragm. Endotracheal tube in stable position . 2. Stable  bibasilar atelectasis and/or infiltrates .   Electronically Signed   By: Marcello Moores  Register   On: 01/21/2015 07:25        ASSESSMENT / PLAN:  GASTROINTESTINAL A:   Acetaminophen overdose - with hx of intentional OD as part of suicide attempt  GI prophylaxis Nutrition  01/22/15: NAC dc'ed yesterday after tylenol level normalized. Currently NPO but hungry  P:   Resume regular diet Change ppi to po Check LFT and INR 01/23/15  PULMONARY OETT 5/1 >>>01/21/15 A: Acute respiratory failure following acetaminophen overdose Respiratory acidosis Tobacco use disorder  - doing well post extubation x 18h  P:   Monitior Tobacco cessation counseling.  CARDIOVASCULAR A:  Prolonged QTc at admit needed bicarb prn   - last EKG 01/20/15.   P:  Repeat EKG - if QTc prolonged - give bicarb  RENAL A:   Hypokalemia Hypomag  P:   Replete mag and k REcheck bmet   HEMATOLOGIC A:   VTE Prophylaxis P:  SCD's / Heparin. INR WNL. CBC in AM.  INFECTIOUS A:   No indication of infection P:   Monitor clinically.  ENDOCRINE A:   Mild hyperglycemia P:   SSI if glucose consistently > 180.  NEUROLOGIC A:   Acute metabolic / toxic encephalopathy  Hx psychiatric pseudoseizure, ETOH abuse, depression, anxiety   5/4/16L: Normal mental status. UDS positive for benzo and opiates  P:   Neurology following, appreciate input. Thiamine / Folate to cntinue psychiatric consult  Called [facility] outpatient fioricet, soma, klonopin, prozac, neurontin, robaxin (has keppar listed on med list, but per EDP, pt not supposed to be taking).  Family updated: None at bedside but patient updated  GLOBAL  move to medsurg with [facility] primary from 01/23/15 and pccm off but if going to [facility] 01/23/15 call PCCM for dc summary awiat psych

## 2015-01-22 NOTE — Progress Notes (Signed)
[place] Progress Note Patient Name: [name] DOB: 1966-02-15 MRN: [id number]   Date of Service  01/22/2015  HPI/Events of Note  pain  eICU Interventions  fent     Intervention Category Minor Interventions: Routine modifications to care plan (e.g. PRN medications for pain, fever)  Raylene Miyamoto. 01/22/2015, 7:54 PM

## 2015-01-22 NOTE — Progress Notes (Signed)
Subjective: Patient is alert and oriented. Only complaint is back pain (which is chronic) and sore throat.   Objective: Current vital signs: BP 155/89 mmHg  Pulse 78  Temp(Src) 98.1 F (36.7 C) (Oral)  Resp 18  Wt 62.3 kg (137 lb 5.6 oz)  SpO2 96% Vital signs in last 24 hours: Temp:  [98.1 F (36.7 C)-99.8 F (37.7 C)] 98.1 F (36.7 C) (05/04 0732) Pulse Rate:  [77-119] 78 (05/04 0800) Resp:  [14-33] 18 (05/04 0800) BP: (103-181)/(67-109) 155/89 mmHg (05/04 0800) SpO2:  [95 %-100 %] 96 % (05/04 0800) FiO2 (%):  [30 %] 30 % (05/03 1700)  Intake/Output from previous day: 05/03 0701 - 05/04 0700 In: 1406.8 [P.O.:600; I.V.:806.8] Out: 7360 [Urine:6660; Emesis/NG output:700] Intake/Output this shift:   Nutritional status: Diet NPO time specified  Neurologic Exam: General: Mental Status: Alert, oriented, thought content appropriate.  Speech fluent without evidence of aphasia.  Able to follow 3 step commands without difficulty. Cranial Nerves: II:  Visual fields grossly normal, pupils equal, round, reactive to light and accommodation III,IV, VI: ptosis not present, extra-ocular motions intact bilaterally V,VII: smile symmetric, facial light touch sensation normal bilaterally VIII: hearing normal bilaterally IX,X: uvula rises symmetrically XI: bilateral shoulder shrug XII: midline tongue extension without atrophy or fasciculations  Motor: Right : Upper extremity   5/5    Left:     Upper extremity   5/5  Lower extremity   5/5     Lower extremity   5/5 Tone and bulk:normal tone throughout; no atrophy noted Sensory: Pinprick and light touch intact throughout, bilaterally Deep Tendon Reflexes:  Right: Upper Extremity   Left: Upper extremity   biceps (C-5 to C-6) 2/4   biceps (C-5 to C-6) 2/4 tricep (C7) 2/4    triceps (C7) 2/4 Brachioradialis (C6) 2/4  Brachioradialis (C6) 2/4  Lower Extremity Lower Extremity  quadriceps (L-2 to L-4) 2/4   quadriceps (L-2 to L-4)  2/4 Achilles (S1) 2/4   Achilles (S1) 2/4  Plantars: Right: downgoing   Left: downgoing Cerebellar: normal finger-to-nose,  normal heel-to-shin test    Lab Results: Basic Metabolic Panel:  Recent Labs Lab 01/19/15 2316 01/19/15 2318 01/20/15 0033 01/20/15 0228 01/21/15 0120 01/21/15 1245 01/22/15 0240  NA 137 140  --  138 136 144 142  K 3.3* 3.3*  --  3.7 4.0 3.7 3.1*  CL 103 103  --  107 109 115* 105  CO2 23  --   --  22 18* 19* 24  GLUCOSE 119* 119*  --  124* 98 89 89  BUN 10 11  --  8 5* <5* <5*  CREATININE 0.72 0.70  --  0.55 0.63 0.57 0.59  CALCIUM 8.7*  --   --  7.8* 8.4* 8.8* 9.5  MG  --   --   --  1.7  --   --  1.9  PHOS  --   --  2.9 4.1  --   --  2.7    Liver Function Tests:  Recent Labs Lab 01/19/15 2316 01/20/15 0228 01/21/15 0120 01/21/15 1245  AST 31 24 27 23   ALT 17 16 26 23   ALKPHOS 89 68 57 61  BILITOT 0.4 0.3 0.1* 0.6  PROT 6.5 5.1* 5.0* 5.6*  ALBUMIN 3.9 3.0* 2.7* 2.9*   No results for input(s): LIPASE, AMYLASE in the last 168 hours.  Recent Labs Lab 01/19/15 2316  AMMONIA 27    CBC:  Recent Labs Lab 01/19/15 2316 01/19/15 2318 01/21/15 0120  01/22/15 0240  WBC 8.6  --  9.6 9.1  NEUTROABS 2.7  --   --   --   HGB 12.2 13.6 11.4* 13.2  HCT 37.3 40.0 34.8* 39.8  MCV 95.6  --  97.5 94.5  PLT 249  --  204 237    Cardiac Enzymes:  Recent Labs Lab 01/19/15 2327  CKTOTAL 345*    Lipid Panel:  Recent Labs Lab 01/20/15 0228  TRIG 257*    CBG:  Recent Labs Lab 01/19/15 2236  GLUCAP 125*    Microbiology: Results for orders placed or performed during the hospital encounter of 01/19/15  MRSA PCR Screening     Status: None   Collection Time: 01/20/15  1:29 AM  Result Value Ref Range Status   MRSA by PCR NEGATIVE NEGATIVE Final    Comment:        The GeneXpert MRSA Assay (FDA approved for NASAL specimens only), is one component of a comprehensive MRSA colonization surveillance program. It is not intended to  diagnose MRSA infection nor to guide or monitor treatment for MRSA infections.     Coagulation Studies:  Recent Labs  01/20/15 0228 01/21/15 1245  LABPROT 13.4 14.2  INR 1.01 1.09    Imaging: Dg Chest Port 1 View  01/22/2015   CLINICAL DATA:  Common a overdose, respiratory failure  EXAM: PORTABLE CHEST - 1 VIEW  COMPARISON:  Portable chest x-ray of Jan 21, 2015  FINDINGS: There is been interval extubation of the trachea and esophagus. The lungs are reasonably well inflated. There is minimal subsegmental atelectasis at the bases. There is no pleural effusion. The cardiac silhouette is top-normal in size. The pulmonary vascularity is not engorged. The bony thorax is unremarkable.  IMPRESSION: Interval extubation of the trachea and esophagus. Minimal persistent bibasilar subsegmental atelectasis.   Electronically Signed   By: David  Martinique M.D.   On: 01/22/2015 07:36   Portable Chest Xray  01/21/2015   CLINICAL DATA:  Respiratory failure .  EXAM: PORTABLE CHEST - 1 VIEW  COMPARISON:  01/19/2015.  FINDINGS: Endotracheal tube in stable position. Interim placement of NG tube, its tip is projected over the stomach. Mediastinum hilar structures are normal. Bibasilar atelectasis and/or infiltrates again noted. Small bilateral pleural effusions cannot be excluded. No pneumothorax.  IMPRESSION: 1. Interim placement of NG tube, its tip is below the left hemidiaphragm. Endotracheal tube in stable position . 2. Stable bibasilar atelectasis and/or infiltrates .   Electronically Signed   By: Marcello Moores  Register   On: 01/21/2015 07:25    Medications:  Scheduled: . antiseptic oral rinse  7 mL Mouth Rinse QID  . chlorhexidine  15 mL Mouth Rinse BID  . folic acid  1 mg Intravenous Daily  . heparin  5,000 Units Subcutaneous 3 times per day  . metoCLOPramide (REGLAN) injection  10 mg Intravenous Once  . pantoprazole (PROTONIX) IV  40 mg Intravenous Daily  . thiamine  100 mg Intravenous Daily   Etta Quill  PA-C [facility] [phone]  01/22/2015, 9:50 AM  Patient seen and examined.  Clinical course and management discussed.  Necessary edits performed.  I agree with the above.  Assessment and plan of care developed and discussed below.  Assessment/Plan: 49 YO female with Tylenol OD. Now extubated and back to baseline with a non focal neurological examination.     Recommendations: 1.  Imaging not indicated at this time 2.  No further neurologic intervention is recommended at this time.  If further questions  arise, please call or page at that time.  Thank you for allowing neurology to participate in the care of this patient.      Alexis Goodell, MD [facility] [phone]  01/22/2015  10:55 AM

## 2015-01-22 NOTE — Consult Note (Signed)
[facility] Face-to-Face Psychiatry Consult   Reason for Consult:  AMS and tylenol overdose Referring Physician:  Dr. Chase Caller Patient Identification: [name] MRN:  [id number] Principal Diagnosis: Tylenol overdose Diagnosis:   Patient Active Problem List   Diagnosis Date Noted  . Respiratory failure [J96.90]   . Seizure-like activity [R56.9] 12/11/2014  . Suicide attempt by hanging [T71.162A]   . MDD (major depressive disorder), recurrent severe, without psychosis [F33.2] 11/29/2014  . Panic disorder [F41.0] 11/29/2014  . Agoraphobia [F40.00] 11/29/2014  . ARF (acute renal failure) [N17.9] 11/25/2014  . Intentional acetaminophen overdose [T39.1X2A] 10/26/2014  . Major depressive disorder, recurrent, severe without psychotic features [F33.2]   . Insomnia [G47.00]   . Chronic pain syndrome [G89.4]   . Bipolar 1 disorder, mixed, severe [F31.63] 10/09/2014  . Acute blood loss anemia [D62] 10/07/2014  . Liver failure, acute [K72.00]   . GI bleed [K92.2] 10/06/2014  . Duodenal ulcer hemorrhage [K26.4] 10/06/2014  . Vomiting [R11.10] 10/05/2014  . Acute hepatitis [K72.00] 10/05/2014  . Acute renal insufficiency [N28.9] 10/05/2014  . Seizure disorder [G40.909] 10/05/2014  . Hyperammonemia [E72.20] 10/05/2014  . Suicidal ideation [R45.851] 10/05/2014  . Bipolar disorder [F31.9] 10/01/2014  . Major depression [F32.2] 10/01/2014  . Nausea and vomiting [R11.2] 09/30/2014  . Diarrhea [R19.7] 09/30/2014  . SIRS (systemic inflammatory response syndrome) [A41.9] 07/15/2014  . Tylenol overdose [T39.1X4A] 07/13/2014  . Hypokalemia [E87.6] 07/13/2014    Total Time spent with patient: 1 hour  Subjective:   [name] is a 49 y.o. female patient admitted with tylenol overdose and AMS.  HPI:  [name] is a 49 y.o. Female admitted to [facility] after intentional overdose of Tylenol nearby parking lot at [facility] emergency department. Patient chart reviewed including neurology  consultation and recommendations Patient's serum is acetaminophen level is 140 on admission. Patient was found in her bed with the dysphoric affect and has a Air cabin crew at bedside. Patient reported she has been depressed, sad, tired, hopeless, helpless, worthless, overwhelmed and tried to end her life by taking Tylenol overdose. Patient has previous history of Tylenol overdose and at one point considered she needed liver transplant. She received ECT treatment during her hospitalization at [facility] in February 2016. Patient is known to this provider from her multiple hospitalizations both at [facility], [place] [facility], [facility]. During the last hospitalization patient was referred to [facility] where she was treated and discharged home with her sister about 2 weeks ago. Patient reportedly came back to her condo apartment and then felt again depressed and becomes suicidal. Patient cannot contract for safety at this time. Patient has a history of seizure so pseudoseizures and received antiepileptic medication Keppra in the past. Patient has no family history of mental illness. Patient has no substance abuse history. Patient has 2 sisters who were tired off patient's mental and medical problems and constantly needed care. Patient has no family or children. Patient was not able to work over the years and struggling for financially. 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  Patient reported her recent hospitalization at [facility] in [place], stated she was misdiagnosed and then discontinued some of her psychiatric medication but she does not remember what medications she was discharged to community. Will ask psychiatric social service to contact [facility] regarding discharge medication if possible   HPI Elements:   Location:  Depression and  anxiety. Quality:  Poor. Severity:  Status post Tylenol overdose. Timing:  psychiatric medication was discontinued. Duration:  2 weeks. Context:  Multiple psychosocial stresses, no job and struggling with finances.  Past Medical History:  Past Medical History  Diagnosis Date  . Migraines   . Endometriosis   . ETOH abuse     sober 3 1/2 years  . Anorexia   . Depression   . Anxiety   . DJD (degenerative joint disease) of cervical spine   . Psychiatric pseudoseizure   . PICC (peripherally inserted central catheter) in place     rt neck  . Seizures     Past Surgical History  Procedure Laterality Date  . Knee surgery    . Abdominal surgery    . Cholecystectomy    . Appendectomy    . Abdominal hysterectomy    . Nasal sinus surgery    . Laproscopy    . Carpal tunnel release      2010  . Esophagogastroduodenoscopy N/A 10/06/2014    Procedure: ESOPHAGOGASTRODUODENOSCOPY (EGD);  Surgeon: Inda Castle, MD;  Location: [place];  Service: Endoscopy;  Laterality: N/A;   Family History:  Family History  Problem Relation Age of Onset  . Hypertension Mother   . Hyperlipidemia Mother   . Heart failure Father   . Cancer Other    Social History:  History  Alcohol Use No     History  Drug Use No    History   Social History  . Marital Status: Single    Spouse Name: N/A  . Number of Children: N/A  . Years of Education: N/A   Social History Main Topics  . Smoking status: Current Every Day Smoker -- 0.25 packs/day for 20 years    Types: Cigarettes  . Smokeless tobacco: Never Used  . Alcohol Use: No  . Drug Use: No  . Sexual Activity: Not on file   Other Topics Concern  . None   Social History Narrative   Additional Social History: Patient lives by herself in a condo and has 2 sisters and her relationships were strained. She has no history of substance abuse.                          Allergies:   Allergies  Allergen Reactions  . Aspirin Anaphylaxis   . Doxycycline Anaphylaxis and Other (See Comments)    Joint damage also  . Imitrex [Sumatriptan Base] Other (See Comments)    Severe hypertension  . Iodinated Diagnostic Agents Anaphylaxis  . Lidocaine Anaphylaxis    Pt states she does well with Marcaine/Bupivicaine without problem  . Prednisone Other (See Comments)    She goes crazy  . Sulfa Antibiotics Anaphylaxis  . Sulfasalazine Anaphylaxis  . Sumatriptan Other (See Comments)    Felt like she was "having a stroke", heart rate and blood pressure "went through the roof"  . Tramadol Other (See Comments)    seizures  . Levofloxacin Other (See Comments)    Joints hurt  . Latex Rash  . Levofloxacin Rash and Other (See Comments)    Joints hurt  . Metrizamide Other (See Comments)  . Nsaids Rash and Other (See Comments)    GI bleed  . Tolmetin Rash    Labs:  Results for orders placed or performed during the hospital encounter of 01/19/15 (from the past 48 hour(s))  CBC     Status: Abnormal   Collection Time: 01/21/15  1:20 AM  Result Value Ref Range   WBC 9.6 4.0 - 10.5 K/uL   RBC 3.57 (L) 3.87 - 5.11 MIL/uL   Hemoglobin 11.4 (L) 12.0 - 15.0 g/dL   HCT 34.8 (L) 36.0 - 46.0 %   MCV 97.5 78.0 - 100.0 fL   MCH 31.9 26.0 - 34.0 pg   MCHC 32.8 30.0 - 36.0 g/dL   RDW 12.9 11.5 - 15.5 %   Platelets 204 150 - 400 K/uL  Comprehensive metabolic panel     Status: Abnormal   Collection Time: 01/21/15  1:20 AM  Result Value Ref Range   Sodium 136 135 - 145 mmol/L   Potassium 4.0 3.5 - 5.1 mmol/L   Chloride 109 101 - 111 mmol/L   CO2 18 (L) 22 - 32 mmol/L   Glucose, Bld 98 70 - 99 mg/dL   BUN 5 (L) 6 - 20 mg/dL   Creatinine, Ser 0.63 0.44 - 1.00 mg/dL   Calcium 8.4 (L) 8.9 - 10.3 mg/dL   Total Protein 5.0 (L) 6.5 - 8.1 g/dL   Albumin 2.7 (L) 3.5 - 5.0 g/dL   AST 27 15 - 41 U/L   ALT 26 14 - 54 U/L   Alkaline Phosphatase 57 38 - 126 U/L   Total Bilirubin 0.1 (L) 0.3 - 1.2 mg/dL   GFR calc non Af Amer >60 >60 mL/min   GFR calc  Af Amer >60 >60 mL/min    Comment: (NOTE) The eGFR has been calculated using the CKD EPI equation. This calculation has not been validated in all clinical situations. eGFR's persistently <90 mL/min signify possible Chronic Kidney Disease.    Anion gap 9 5 - 15  Acetaminophen level     Status: Abnormal   Collection Time: 01/21/15  1:20 AM  Result Value Ref Range   Acetaminophen (Tylenol), Serum 34 (H) 10 - 30 ug/mL    Comment:        THERAPEUTIC CONCENTRATIONS VARY SIGNIFICANTLY. A RANGE OF 10-30 ug/mL MAY BE AN EFFECTIVE CONCENTRATION FOR MANY PATIENTS. HOWEVER, SOME ARE BEST TREATED AT CONCENTRATIONS OUTSIDE THIS RANGE. ACETAMINOPHEN CONCENTRATIONS >150 ug/mL AT 4 HOURS AFTER INGESTION AND >50 ug/mL AT 12 HOURS AFTER INGESTION ARE OFTEN ASSOCIATED WITH TOXIC REACTIONS.   Acetaminophen level     Status: Abnormal   Collection Time: 01/21/15 12:40 PM  Result Value Ref Range   Acetaminophen (Tylenol), Serum <10 (L) 10 - 30 ug/mL    Comment:        THERAPEUTIC CONCENTRATIONS VARY SIGNIFICANTLY. A RANGE OF 10-30 ug/mL MAY BE AN EFFECTIVE CONCENTRATION FOR MANY PATIENTS. HOWEVER, SOME ARE BEST TREATED AT CONCENTRATIONS OUTSIDE THIS RANGE. ACETAMINOPHEN CONCENTRATIONS >150 ug/mL AT 4 HOURS AFTER INGESTION AND >50 ug/mL AT 12 HOURS AFTER INGESTION ARE OFTEN ASSOCIATED WITH TOXIC REACTIONS.   Protime-INR     Status: None   Collection Time: 01/21/15 12:45 PM  Result Value Ref Range   Prothrombin Time 14.2 11.6 - 15.2 seconds   INR 1.09 0.00 - 1.49  Comprehensive metabolic panel     Status: Abnormal   Collection Time: 01/21/15 12:45 PM  Result Value Ref Range   Sodium 144 135 - 145 mmol/L   Potassium 3.7 3.5 - 5.1 mmol/L   Chloride 115 (H) 101 - 111 mmol/L   CO2 19 (L) 22 - 32 mmol/L   Glucose, Bld 89 70 - 99 mg/dL   BUN <5 (L) 6 - 20 mg/dL   Creatinine, Ser 0.57 0.44 - 1.00 mg/dL   Calcium 8.8 (  L) 8.9 - 10.3 mg/dL   Total Protein 5.6 (L) 6.5 - 8.1 g/dL   Albumin  2.9 (L) 3.5 - 5.0 g/dL   AST 23 15 - 41 U/L   ALT 23 14 - 54 U/L   Alkaline Phosphatase 61 38 - 126 U/L   Total Bilirubin 0.6 0.3 - 1.2 mg/dL   GFR calc non Af Amer >60 >60 mL/min   GFR calc Af Amer >60 >60 mL/min    Comment: (NOTE) The eGFR has been calculated using the CKD EPI equation. This calculation has not been validated in all clinical situations. eGFR's persistently <90 mL/min signify possible Chronic Kidney Disease.    Anion gap 10 5 - 15  Osmolality, urine     Status: Abnormal   Collection Time: 01/21/15  5:15 PM  Result Value Ref Range   Osmolality, Ur 266 (L) 390 - 1090 mOsm/kg    Comment: Performed at [facility]  Sodium, urine, random     Status: None   Collection Time: 01/21/15  5:15 PM  Result Value Ref Range   Sodium, Ur 112 mmol/L  CBC     Status: None   Collection Time: 01/22/15  2:40 AM  Result Value Ref Range   WBC 9.1 4.0 - 10.5 K/uL   RBC 4.21 3.87 - 5.11 MIL/uL   Hemoglobin 13.2 12.0 - 15.0 g/dL   HCT 39.8 36.0 - 46.0 %   MCV 94.5 78.0 - 100.0 fL   MCH 31.4 26.0 - 34.0 pg   MCHC 33.2 30.0 - 36.0 g/dL   RDW 12.6 11.5 - 15.5 %   Platelets 237 150 - 400 K/uL  Basic metabolic panel     Status: Abnormal   Collection Time: 01/22/15  2:40 AM  Result Value Ref Range   Sodium 142 135 - 145 mmol/L   Potassium 3.1 (L) 3.5 - 5.1 mmol/L   Chloride 105 101 - 111 mmol/L   CO2 24 22 - 32 mmol/L   Glucose, Bld 89 70 - 99 mg/dL   BUN <5 (L) 6 - 20 mg/dL   Creatinine, Ser 0.59 0.44 - 1.00 mg/dL   Calcium 9.5 8.9 - 10.3 mg/dL   GFR calc non Af Amer >60 >60 mL/min   GFR calc Af Amer >60 >60 mL/min    Comment: (NOTE) The eGFR has been calculated using the CKD EPI equation. This calculation has not been validated in all clinical situations. eGFR's persistently <90 mL/min signify possible Chronic Kidney Disease.    Anion gap 13 5 - 15  Magnesium     Status: None   Collection Time: 01/22/15  2:40 AM  Result Value Ref Range   Magnesium 1.9 1.7 - 2.4  mg/dL  Phosphorus     Status: None   Collection Time: 01/22/15  2:40 AM  Result Value Ref Range   Phosphorus 2.7 2.5 - 4.6 mg/dL  I-STAT 3, arterial blood gas (G3+)     Status: Abnormal   Collection Time: 01/22/15  3:43 AM  Result Value Ref Range   pH, Arterial 7.448 7.350 - 7.450   pCO2 arterial 41.1 35.0 - 45.0 mmHg   pO2, Arterial 64.0 (L) 80.0 - 100.0 mmHg   Bicarbonate 28.4 (H) 20.0 - 24.0 mEq/L   TCO2 30 0 - 100 mmol/L   O2 Saturation 93.0 %   Acid-Base Excess 4.0 (H) 0.0 - 2.0 mmol/L   Patient temperature 98.6 F    Collection site RADIAL, ALLEN'S TEST ACCEPTABLE  Drawn by RT    Sample type ARTERIAL     Vitals: Blood pressure 161/85, pulse 76, temperature 98.1 F (36.7 C), temperature source Oral, resp. rate 16, weight 62.3 kg (137 lb 5.6 oz), SpO2 95 %.  Risk to Self:   Risk to Others:   Prior Inpatient Therapy:   Prior Outpatient Therapy:    Current Facility-Administered Medications  Medication Dose Route Frequency Provider Last Rate Last Dose  . ALPRAZolam Duanne Moron) tablet 0.25 mg  0.25 mg Oral TID PRN Wilhelmina Mcardle, MD   0.25 mg at 01/22/15 0427  . folic acid injection 1 mg  1 mg Intravenous Daily Rahul P Desai, PA-C   1 mg at 01/21/15 1009  . heparin injection 5,000 Units  5,000 Units Subcutaneous 3 times per day Rahul Dianna Rossetti, PA-C   5,000 Units at 01/21/15 1549  . magnesium sulfate IVPB 2 g 50 mL  2 g Intravenous Once Brand Males, MD      . pantoprazole (PROTONIX) EC tablet 40 mg  40 mg Oral Q1200 Brand Males, MD      . potassium chloride SA (K-DUR,KLOR-CON) CR tablet 40 mEq  40 mEq Oral Once Brand Males, MD      . thiamine (B-1) injection 100 mg  100 mg Intravenous Daily Rahul P Desai, PA-C   100 mg at 01/21/15 1009    Musculoskeletal: Strength & Muscle Tone: decreased Gait & Station: unable to stand Patient leans: N/A  Psychiatric Specialty Exam: Physical Exam as per history and physical   ROS feeling tired, sad, anxious, hopeless,  helpless and stomach pain but denied muscle pains, dizziness, chest pain and shortness of breath. Negative for rest of the review of systems.   Blood pressure 161/85, pulse 76, temperature 98.1 F (36.7 C), temperature source Oral, resp. rate 16, weight 62.3 kg (137 lb 5.6 oz), SpO2 95 %.Body mass index is 25.96 kg/(m^2).  General Appearance: Guarded  Eye Contact::  Fair  Speech:  Clear and Coherent and Slow  Volume:  Decreased  Mood:  Anxious, Depressed, Dysphoric, Hopeless and Worthless  Affect:  Depressed and Tearful  Thought Process:  Coherent and Goal Directed  Orientation:  Full (Time, Place, and Person)  Thought Content:  Rumination  Suicidal Thoughts:  Yes.  with intent/plan  Homicidal Thoughts:  No  Memory:  Immediate;   Fair Recent;   Poor  Judgement:  Impaired  Insight:  Fair  Psychomotor Activity:  Psychomotor Retardation  Concentration:  Fair  Recall:  [place] of Knowledge:Fair  Language: Good  Akathisia:  Negative  Handed:  Right  AIMS (if indicated):     Assets:  Communication Skills Desire for Improvement Leisure Time Social Support  ADL's:  Intact  Cognition: WNL  Sleep:      Medical Decision Making: Review of Psycho-Social Stressors (1), Review or order clinical lab tests (1), Review and summation of old records (2), Established Problem, Worsening (2), Review of Last Therapy Session (1), Review or order medicine tests (1), Review of Medication Regimen & Side Effects (2) and Review of New Medication or Change in Dosage (2)  Treatment Plan Summary: Daily contact with patient to assess and evaluate symptoms and progress in treatment and Medication management  Plan:  Patient presented with increased symptoms of depression and suicidal ideation status post Tylenol overdose. Patient cannot contract for safety at this time and required crisis stabilization, safety monitoring and medication management for depression and anxiety  Suicidal: Continue Child psychotherapist  Depression:  Fluoxetine 10 mg daily for depression   Generalized anxiety: gabapentin 300 mg 3 times daily for anxiety  Recommend psychiatric Inpatient admission when medically cleared. Supportive therapy provided about ongoing stressors.  Disposition: Refer to the psychiatric social service regarding appropriate psychiatric inpatient hospitalization when medically stable.   [name],JANARDHAHA R. 01/22/2015 11:02 AM

## 2015-01-22 NOTE — Clinical Social Work Psych Note (Signed)
Psych CSW received consult for possible Tylenol overdose and SI.  Psychiatry evaluated patient on 01/22/2015.  Psych CSW now following and will assist with disposition needs.    Nonnie Done, LCSW [phone]  Psychiatric & Orthopedics (5N 1-8) Clinical Social Worker

## 2015-01-22 NOTE — Progress Notes (Signed)
Nutrition Follow-up  DOCUMENTATION CODES:  Not applicable  INTERVENTION:  Tube feeding  Initiate Jevity 1.2 @ 30 ml/hr via NG tube and increase by 10 ml every 4 hours to goal rate of 50 ml/hr.   30 ml Prostat daily.    Tube feeding regimen provides 1540 kcal, 81 grams of protein, and 972 ml of H2O.    NUTRITION DIAGNOSIS:  Inadequate oral intake related to inability to eat as evidenced by NPO status.  ongoing  GOAL:  Patient will meet greater than or equal to 90% of their needs  Not met   MONITOR:  TF tolerance  REASON FOR ASSESSMENT:  Ventilator    ASSESSMENT:  50 y.o. female who was recently treated with intentional tylenol overdose with significant psychiatric history who was found in a car unresponsive in the [facility] parking lot. Pt extubated 5/3, remains NPO, tube in place to start nutrition, pt has safety sitter at bedside Potassium low Pt on folic acid and thiamine  Pt started on VHP via protocol.   Height:  Ht Readings from Last 1 Encounters:  12/11/14 _0  (1.549 m)    Weight:  Wt Readings from Last 1 Encounters:  01/20/15 137 lb 5.6 oz (62.3 kg)    Ideal Body Weight:  47.7 kg  Wt Readings from Last 10 Encounters:  01/20/15 137 lb 5.6 oz (62.3 kg)  12/15/14 137 lb 5.6 oz (62.3 kg)  10/27/14 121 lb 0.5 oz (54.9 kg)  10/09/14 118 lb (53.524 kg)  10/01/14 125 lb 4.8 oz (56.836 kg)  07/15/14 139 lb 11.2 oz (63.368 kg)  03/20/14 136 lb 3.2 oz (61.78 kg)  11/04/13 130 lb (58.968 kg)  05/18/12 151 lb (68.493 kg)    BMI:  Body mass index is 25.96 kg/(m^2).  Estimated Nutritional Needs:  Kcal:  1500-1700  Protein:  75-85 grams  Fluid:  > 1.5 L/day  Skin:  Reviewed, no issues  Diet Order:  Diet regular Room service appropriate?: Yes; Fluid consistency:: Thin  EDUCATION NEEDS:  No education needs identified at this time   Intake/Output Summary (Last 24 hours) at 01/22/15 1631 Last data filed at 01/22/15 1353  Gross per 24 hour   Intake 659.33 ml  Output   2925 ml  Net -2265.67 ml    Last BM:  None documented  [place], [place], [place] Pager [phone] After Hours Pager

## 2015-01-23 DIAGNOSIS — R9431 Abnormal electrocardiogram [ECG] [EKG]: Secondary | ICD-10-CM | POA: Diagnosis present

## 2015-01-23 DIAGNOSIS — E876 Hypokalemia: Secondary | ICD-10-CM

## 2015-01-23 DIAGNOSIS — F32A Depression, unspecified: Secondary | ICD-10-CM | POA: Diagnosis present

## 2015-01-23 DIAGNOSIS — T43011A Poisoning by tricyclic antidepressants, accidental (unintentional), initial encounter: Secondary | ICD-10-CM | POA: Diagnosis present

## 2015-01-23 DIAGNOSIS — F411 Generalized anxiety disorder: Secondary | ICD-10-CM | POA: Diagnosis present

## 2015-01-23 DIAGNOSIS — F329 Major depressive disorder, single episode, unspecified: Secondary | ICD-10-CM | POA: Diagnosis present

## 2015-01-23 DIAGNOSIS — E8729 Other acidosis: Secondary | ICD-10-CM | POA: Diagnosis present

## 2015-01-23 DIAGNOSIS — E872 Acidosis: Secondary | ICD-10-CM

## 2015-01-23 DIAGNOSIS — F319 Bipolar disorder, unspecified: Secondary | ICD-10-CM | POA: Diagnosis present

## 2015-01-23 DIAGNOSIS — T1491XA Suicide attempt, initial encounter: Secondary | ICD-10-CM | POA: Diagnosis present

## 2015-01-23 DIAGNOSIS — F1010 Alcohol abuse, uncomplicated: Secondary | ICD-10-CM | POA: Diagnosis present

## 2015-01-23 LAB — PROTIME-INR
INR: 0.89 (ref 0.00–1.49)
Prothrombin Time: 12.1 seconds (ref 11.6–15.2)

## 2015-01-23 LAB — CBC WITH DIFFERENTIAL/PLATELET
Basophils Absolute: 0 10*3/uL (ref 0.0–0.1)
Basophils Relative: 0 % (ref 0–1)
Eosinophils Absolute: 0.4 10*3/uL (ref 0.0–0.7)
Eosinophils Relative: 5 % (ref 0–5)
HCT: 38.4 % (ref 36.0–46.0)
Hemoglobin: 12.7 g/dL (ref 12.0–15.0)
Lymphocytes Relative: 40 % (ref 12–46)
Lymphs Abs: 3.1 10*3/uL (ref 0.7–4.0)
MCH: 31.4 pg (ref 26.0–34.0)
MCHC: 33.1 g/dL (ref 30.0–36.0)
MCV: 95 fL (ref 78.0–100.0)
MONO ABS: 0.6 10*3/uL (ref 0.1–1.0)
MONOS PCT: 8 % (ref 3–12)
NEUTROS ABS: 3.6 10*3/uL (ref 1.7–7.7)
NEUTROS PCT: 47 % (ref 43–77)
Platelets: 246 10*3/uL (ref 150–400)
RBC: 4.04 MIL/uL (ref 3.87–5.11)
RDW: 12.5 % (ref 11.5–15.5)
WBC: 7.7 10*3/uL (ref 4.0–10.5)

## 2015-01-23 LAB — GLUCOSE, CAPILLARY
Glucose-Capillary: 153 mg/dL — ABNORMAL HIGH (ref 70–99)
Glucose-Capillary: 90 mg/dL (ref 70–99)
Glucose-Capillary: 131 mg/dL — ABNORMAL HIGH (ref 70–99)
Glucose-Capillary: 160 mg/dL — ABNORMAL HIGH (ref 70–99)

## 2015-01-23 LAB — MAGNESIUM
MAGNESIUM: 1.8 mg/dL (ref 1.7–2.4)
Magnesium: 2.1 mg/dL (ref 1.7–2.4)

## 2015-01-23 LAB — BASIC METABOLIC PANEL
ANION GAP: 10 (ref 5–15)
BUN: 6 mg/dL (ref 6–20)
CO2: 27 mmol/L (ref 22–32)
Calcium: 9.3 mg/dL (ref 8.9–10.3)
Chloride: 102 mmol/L (ref 101–111)
Creatinine, Ser: 0.89 mg/dL (ref 0.44–1.00)
GFR calc Af Amer: >60 mL/min (ref >60)
Glucose, Bld: 146 mg/dL — ABNORMAL HIGH (ref 70–99)
POTASSIUM: 2.8 mmol/L — AB (ref 3.5–5.1)
SODIUM: 139 mmol/L (ref 135–145)

## 2015-01-23 LAB — HEPATIC FUNCTION PANEL
ALBUMIN: 3.4 g/dL — AB (ref 3.5–5.0)
ALK PHOS: 77 U/L (ref 38–126)
ALT: 18 U/L (ref 14–54)
AST: 18 U/L (ref 15–41)
BILIRUBIN TOTAL: 0.4 mg/dL (ref 0.3–1.2)
Bilirubin, Direct: <0.1 mg/dL — ABNORMAL LOW (ref 0.1–0.5)
Total Protein: 6.5 g/dL (ref 6.5–8.1)

## 2015-01-23 LAB — LACTIC ACID, PLASMA: Lactic Acid, Venous: 3 mmol/L (ref 0.5–2.0)

## 2015-01-23 LAB — POTASSIUM: Potassium: 3.3 mmol/L — ABNORMAL LOW (ref 3.5–5.1)

## 2015-01-23 MED ORDER — OXYCODONE HCL 5 MG PO TABS
5.0000 mg | ORAL_TABLET | ORAL | Status: DC | PRN
  Administered 2015-01-23 – 2015-02-06 (×57): 5 mg via ORAL
  Filled 2015-01-23 (×57): qty 1

## 2015-01-23 MED ORDER — POTASSIUM CHLORIDE CRYS ER 20 MEQ PO TBCR
40.0000 meq | EXTENDED_RELEASE_TABLET | Freq: Three times a day (TID) | ORAL | Status: AC
  Administered 2015-01-23 (×3): 40 meq via ORAL
  Filled 2015-01-23 (×4): qty 2

## 2015-01-23 MED ORDER — BOOST / RESOURCE BREEZE PO LIQD
1.0000 | Freq: Three times a day (TID) | ORAL | Status: DC
  Administered 2015-01-23 – 2015-01-29 (×11): 1 via ORAL

## 2015-01-23 MED ORDER — POTASSIUM CHLORIDE CRYS ER 20 MEQ PO TBCR: 40.0000 meq | EXTENDED_RELEASE_TABLET | Freq: Three times a day (TID) | ORAL | Status: DC

## 2015-01-23 MED ORDER — ZOLPIDEM TARTRATE 5 MG PO TABS
5.0000 mg | ORAL_TABLET | Freq: Once | ORAL | Status: AC
  Administered 2015-01-24: 5 mg via ORAL
  Filled 2015-01-23: qty 1

## 2015-01-23 NOTE — Progress Notes (Signed)
[place] Progress Note Patient Name: [name] DOB: 1965/12/03 MRN: [id number]   Date of Service  01/23/2015  HPI/Events of Note    eICU Interventions  PRN oxycodone     Intervention Category Intermediate Interventions: Pain - evaluation and management  Merton Border 01/23/2015, 3:18 AM
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
## 2015-01-23 NOTE — Progress Notes (Signed)
Nutrition Follow-up  DOCUMENTATION CODES:  Not applicable  INTERVENTION:  Boost Breeze (TID)  NUTRITION DIAGNOSIS:  Inadequate oral intake related to other (see comment) (depression and social situation) as evidenced by meal completion < 50%.  Ongoing.   GOAL:  Patient will meet greater than or equal to 90% of their needs  Not met  MONITOR:  PO intake, Supplement acceptance  REASON FOR ASSESSMENT:  Ventilator    ASSESSMENT:  48 y.o. female who was recently treated with intentional tylenol overdose with significant psychiatric history who was found in a car unresponsive in the [facility] parking lot. Pt extubated 5/3 Pt now on Regular diet.  Per pt she sometimes eats and sometimes she does not. Reports decreased appetite but also since she is living with her sister and brother-in-law she also does not want to eat their food.  Pt dislikes ensure but is willing to try Resource Breeze.   Height:  Ht Readings from Last 1 Encounters:  12/11/14 5' 1" (1.549 m)    Weight:  Wt Readings from Last 1 Encounters:  01/22/15 134 lb 11.2 oz (61.1 kg)    Ideal Body Weight:  47.7 kg  BMI:  Body mass index is 25.46 kg/(m^2).  Estimated Nutritional Needs:  Kcal:  1500-1700  Protein:  75-85 grams  Fluid:  > 1.5 L/day  Skin:  Reviewed, no issues  Diet Order:  Diet regular Room service appropriate?: Yes; Fluid consistency:: Thin  EDUCATION NEEDS:  No education needs identified at this time   Intake/Output Summary (Last 24 hours) at 01/23/15 1530 Last data filed at 01/23/15 0600  Gross per 24 hour  Intake   1080 ml  Output      0 ml  Net   1080 ml    Last BM:  PTA    RD, LDN, CNSC [phone] Pager [phone] After Hours Pager  
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
## 2015-01-23 NOTE — Consult Note (Signed)
Psychiatry Consult Follow up  Reason for Consult:  AMS and tylenol overdose Referring Physician:  Dr. Chase Caller Patient Identification: Toby Ayad MRN:  [id number] Principal Diagnosis: Tylenol overdose Diagnosis:   Patient Active Problem List   Diagnosis Date Noted  . Respiratory failure [J96.90]   . Seizure-like activity [R56.9] 12/11/2014  . Suicide attempt by hanging [T71.162A]   . MDD (major depressive disorder), recurrent severe, without psychosis [F33.2] 11/29/2014  . Panic disorder [F41.0] 11/29/2014  . Agoraphobia [F40.00] 11/29/2014  . ARF (acute renal failure) [N17.9] 11/25/2014  . Intentional acetaminophen overdose [T39.1X2A] 10/26/2014  . Major depressive disorder, recurrent, severe without psychotic features [F33.2]   . Insomnia [G47.00]   . Chronic pain syndrome [G89.4]   . Bipolar 1 disorder, mixed, severe [F31.63] 10/09/2014  . Acute blood loss anemia [D62] 10/07/2014  . Liver failure, acute [K72.00]   . GI bleed [K92.2] 10/06/2014  . Duodenal ulcer hemorrhage [K26.4] 10/06/2014  . Vomiting [R11.10] 10/05/2014  . Acute hepatitis [K72.00] 10/05/2014  . Acute renal insufficiency [N28.9] 10/05/2014  . Seizure disorder [G40.909] 10/05/2014  . Hyperammonemia [E72.20] 10/05/2014  . Suicidal ideation [R45.851] 10/05/2014  . Bipolar disorder [F31.9] 10/01/2014  . Major depression [F32.2] 10/01/2014  . Nausea and vomiting [R11.2] 09/30/2014  . Diarrhea [R19.7] 09/30/2014  . SIRS (systemic inflammatory response syndrome) [A41.9] 07/15/2014  . Tylenol overdose [T39.1X4A] 07/13/2014  . Hypokalemia [E87.6] 07/13/2014    Total Time spent with patient: 30 minutes  Subjective:   Charnelle Bergeman is a 49 y.o. female patient admitted with tylenol overdose and AMS.  HPI:  Danyele Smejkal is a 49 y.o. Female admitted to [facility] after intentional overdose of Tylenol nearby parking lot at [facility] emergency department. Patient chart reviewed including neurology  consultation and recommendations Patient's serum is acetaminophen level is 140 on admission. Patient was found in her bed with the dysphoric affect and has a Air cabin crew at bedside. Patient reported she has been depressed, sad, tired, hopeless, helpless, worthless, overwhelmed and tried to end her life by taking Tylenol overdose. Patient has previous history of Tylenol overdose and at one point considered she needed liver transplant. She received ECT treatment during her hospitalization at [facility] in February 2016. Patient is known to this provider from her multiple hospitalizations both at [facility], [facility], [facility]. During the last hospitalization patient was referred to [facility] where she was treated and discharged home with her sister about 2 weeks ago. Patient reportedly came back to her condo apartment and then felt again depressed and becomes suicidal. Patient cannot contract for safety at this time. Patient has a history of seizure so pseudoseizures and received antiepileptic medication Keppra in the past. Patient has no family history of mental illness. Patient has no substance abuse history. Patient has 2 sisters who were tired off patient's mental and medical problems and constantly needed care. Patient has no family or children. Patient was not able to work over the years and struggling for financially. 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 Patient reported her recent hospitalization at [facility] in [place], stated she was misdiagnosed and then discontinued some of her psychiatric medication but she does not remember what medications she was discharged to community. Will ask psychiatric social service to contact [facility] regarding discharge medication if possible  Interval History: Patient seen today for  psychiatric consultation follow-up in  intensive care unit. Patient appeared lying down in her bed and safety sitter's next to her bed. Patient somewhat calm down emotionally since yesterday. Patient reported she spoke with the social worker and working with her finding appropriate inpatient psychiatric hospitalization. Patient feels somewhat relieved that she's going to get help for depression, anxiety. Patient also has been in contact with her sister Jan and other sister who might visit her today. Patient continued to be depressed, anxious, insomnia reportedly slept 3 hours which is disturbed, sore throat secondary to intubation and possible lung infection and has intact suicidal ideation without any intention and plan at this time. Patient contract for safety while in the hospital. Patient was not medically cleared at this time as for the staff RN.  Past Medical History:  Past Medical History  Diagnosis Date  . Migraines   . Endometriosis   . ETOH abuse     sober 3 1/2 years  . Anorexia   . Depression   . Anxiety   . DJD (degenerative joint disease) of cervical spine   . Psychiatric pseudoseizure   . PICC (peripherally inserted central catheter) in place     rt neck  . Seizures     Past Surgical History  Procedure Laterality Date  . Knee surgery    . Abdominal surgery    . Cholecystectomy    . Appendectomy    . Abdominal hysterectomy    . Nasal sinus surgery    . Laproscopy    . Carpal tunnel release      2010  . Esophagogastroduodenoscopy N/A 10/06/2014    Procedure: ESOPHAGOGASTRODUODENOSCOPY (EGD);  Surgeon: Inda Castle, MD;  Location: [facility];  Service: Endoscopy;  Laterality: N/A;   Family History:  Family History  Problem Relation Age of Onset  . Hypertension Mother   . Hyperlipidemia Mother   . Heart failure Father   . Cancer Other    Social History:  History  Alcohol Use No     History  Drug Use No    History   Social History  . Marital Status:  Single    Spouse Name: N/A  . Number of Children: N/A  . Years of Education: N/A   Social History Main Topics  . Smoking status: Current Every Day Smoker -- 0.25 packs/day for 20 years    Types: Cigarettes  . Smokeless tobacco: Never Used  . Alcohol Use: No  . Drug Use: No  . Sexual Activity: Not on file   Other Topics Concern  . None   Social History Narrative   Additional Social History: Patient lives by herself in a condo and has 2 sisters and her relationships were strained. She has no history of substance abuse.                          Allergies:   Allergies  Allergen Reactions  . Aspirin Anaphylaxis  . Doxycycline Anaphylaxis and Other (See Comments)    Joint damage also  . Imitrex [Sumatriptan Base] Other (See Comments)    Severe hypertension  . Iodinated Diagnostic Agents Anaphylaxis  . Lidocaine Anaphylaxis    Pt states she does well with Marcaine/Bupivicaine without problem  . Prednisone Other (See Comments)    She goes crazy  . Sulfa Antibiotics Anaphylaxis  . Sulfasalazine Anaphylaxis  . Sumatriptan Other (See Comments)    Felt like she was "having a stroke", heart rate and blood pressure "went through the roof"  .  Tramadol Other (See Comments)    seizures  . Levofloxacin Other (See Comments)    Joints hurt  . Latex Rash  . Levofloxacin Rash and Other (See Comments)    Joints hurt  . Metrizamide Other (See Comments)  . Nsaids Rash and Other (See Comments)    GI bleed  . Tolmetin Rash    Labs:  Results for orders placed or performed during the hospital encounter of 01/19/15 (from the past 48 hour(s))  Acetaminophen level     Status: Abnormal   Collection Time: 01/21/15 12:40 PM  Result Value Ref Range   Acetaminophen (Tylenol), Serum <10 (L) 10 - 30 ug/mL    Comment:        THERAPEUTIC CONCENTRATIONS VARY SIGNIFICANTLY. A RANGE OF 10-30 ug/mL MAY BE AN EFFECTIVE CONCENTRATION FOR MANY PATIENTS. HOWEVER, SOME ARE BEST TREATED AT  CONCENTRATIONS OUTSIDE THIS RANGE. ACETAMINOPHEN CONCENTRATIONS >150 ug/mL AT 4 HOURS AFTER INGESTION AND >50 ug/mL AT 12 HOURS AFTER INGESTION ARE OFTEN ASSOCIATED WITH TOXIC REACTIONS.   Protime-INR     Status: None   Collection Time: 01/21/15 12:45 PM  Result Value Ref Range   Prothrombin Time 14.2 11.6 - 15.2 seconds   INR 1.09 0.00 - 1.49  Comprehensive metabolic panel     Status: Abnormal   Collection Time: 01/21/15 12:45 PM  Result Value Ref Range   Sodium 144 135 - 145 mmol/L   Potassium 3.7 3.5 - 5.1 mmol/L   Chloride 115 (H) 101 - 111 mmol/L   CO2 19 (L) 22 - 32 mmol/L   Glucose, Bld 89 70 - 99 mg/dL   BUN <5 (L) 6 - 20 mg/dL   Creatinine, Ser 0.57 0.44 - 1.00 mg/dL   Calcium 8.8 (L) 8.9 - 10.3 mg/dL   Total Protein 5.6 (L) 6.5 - 8.1 g/dL   Albumin 2.9 (L) 3.5 - 5.0 g/dL   AST 23 15 - 41 U/L   ALT 23 14 - 54 U/L   Alkaline Phosphatase 61 38 - 126 U/L   Total Bilirubin 0.6 0.3 - 1.2 mg/dL   GFR calc non Af Amer >60 >60 mL/min   GFR calc Af Amer >60 >60 mL/min    Comment: (NOTE) The eGFR has been calculated using the CKD EPI equation. This calculation has not been validated in all clinical situations. eGFR's persistently <90 mL/min signify possible Chronic Kidney Disease.    Anion gap 10 5 - 15  Osmolality, urine     Status: Abnormal   Collection Time: 01/21/15  5:15 PM  Result Value Ref Range   Osmolality, Ur 266 (L) 390 - 1090 mOsm/kg    Comment: Performed at [facility]  Sodium, urine, random     Status: None   Collection Time: 01/21/15  5:15 PM  Result Value Ref Range   Sodium, Ur 112 mmol/L  CBC     Status: None   Collection Time: 01/22/15  2:40 AM  Result Value Ref Range   WBC 9.1 4.0 - 10.5 K/uL   RBC 4.21 3.87 - 5.11 MIL/uL   Hemoglobin 13.2 12.0 - 15.0 g/dL   HCT 39.8 36.0 - 46.0 %   MCV 94.5 78.0 - 100.0 fL   MCH 31.4 26.0 - 34.0 pg   MCHC 33.2 30.0 - 36.0 g/dL   RDW 12.6 11.5 - 15.5 %   Platelets 237 150 - 400 K/uL  Basic  metabolic panel     Status: Abnormal   Collection Time: 01/22/15  2:40  AM  Result Value Ref Range   Sodium 142 135 - 145 mmol/L   Potassium 3.1 (L) 3.5 - 5.1 mmol/L   Chloride 105 101 - 111 mmol/L   CO2 24 22 - 32 mmol/L   Glucose, Bld 89 70 - 99 mg/dL   BUN <5 (L) 6 - 20 mg/dL   Creatinine, Ser 0.59 0.44 - 1.00 mg/dL   Calcium 9.5 8.9 - 10.3 mg/dL   GFR calc non Af Amer >60 >60 mL/min   GFR calc Af Amer >60 >60 mL/min    Comment: (NOTE) The eGFR has been calculated using the CKD EPI equation. This calculation has not been validated in all clinical situations. eGFR's persistently <90 mL/min signify possible Chronic Kidney Disease.    Anion gap 13 5 - 15  Magnesium     Status: None   Collection Time: 01/22/15  2:40 AM  Result Value Ref Range   Magnesium 1.9 1.7 - 2.4 mg/dL  Phosphorus     Status: None   Collection Time: 01/22/15  2:40 AM  Result Value Ref Range   Phosphorus 2.7 2.5 - 4.6 mg/dL  I-STAT 3, arterial blood gas (G3+)     Status: Abnormal   Collection Time: 01/22/15  3:43 AM  Result Value Ref Range   pH, Arterial 7.448 7.350 - 7.450   pCO2 arterial 41.1 35.0 - 45.0 mmHg   pO2, Arterial 64.0 (L) 80.0 - 100.0 mmHg   Bicarbonate 28.4 (H) 20.0 - 24.0 mEq/L   TCO2 30 0 - 100 mmol/L   O2 Saturation 93.0 %   Acid-Base Excess 4.0 (H) 0.0 - 2.0 mmol/L   Patient temperature 98.6 F    Collection site RADIAL, ALLEN'S TEST ACCEPTABLE    Drawn by RT    Sample type ARTERIAL   Glucose, capillary     Status: Abnormal   Collection Time: 01/22/15  7:29 PM  Result Value Ref Range   Glucose-Capillary 160 (H) 70 - 99 mg/dL  Glucose, capillary     Status: None   Collection Time: 01/23/15 12:39 AM  Result Value Ref Range   Glucose-Capillary 90 70 - 99 mg/dL  Lactic acid, plasma     Status: Abnormal   Collection Time: 01/23/15  2:45 AM  Result Value Ref Range   Lactic Acid, Venous 3.0 (HH) 0.5 - 2.0 mmol/L    Comment: REPEATED TO VERIFY CRITICAL RESULT CALLED TO, READ BACK BY  AND VERIFIED WITH: Sheral Flow [id number] WILDERK   Protime-INR     Status: None   Collection Time: 01/23/15  2:45 AM  Result Value Ref Range   Prothrombin Time 12.1 11.6 - 15.2 seconds   INR 0.89 0.00 - 1.49  Hepatic function panel     Status: Abnormal   Collection Time: 01/23/15  2:45 AM  Result Value Ref Range   Total Protein 6.5 6.5 - 8.1 g/dL   Albumin 3.4 (L) 3.5 - 5.0 g/dL   AST 18 15 - 41 U/L   ALT 18 14 - 54 U/L   Alkaline Phosphatase 77 38 - 126 U/L   Total Bilirubin 0.4 0.3 - 1.2 mg/dL   Bilirubin, Direct <0.1 (L) 0.1 - 0.5 mg/dL   Indirect Bilirubin NOT CALCULATED 0.3 - 0.9 mg/dL  CBC with Differential/Platelet     Status: None   Collection Time: 01/23/15  2:45 AM  Result Value Ref Range   WBC 7.7 4.0 - 10.5 K/uL   RBC 4.04 3.87 - 5.11 MIL/uL   Hemoglobin  12.7 12.0 - 15.0 g/dL   HCT 38.4 36.0 - 46.0 %   MCV 95.0 78.0 - 100.0 fL   MCH 31.4 26.0 - 34.0 pg   MCHC 33.1 30.0 - 36.0 g/dL   RDW 12.5 11.5 - 15.5 %   Platelets 246 150 - 400 K/uL   Neutrophils Relative % 47 43 - 77 %   Neutro Abs 3.6 1.7 - 7.7 K/uL   Lymphocytes Relative 40 12 - 46 %   Lymphs Abs 3.1 0.7 - 4.0 K/uL   Monocytes Relative 8 3 - 12 %   Monocytes Absolute 0.6 0.1 - 1.0 K/uL   Eosinophils Relative 5 0 - 5 %   Eosinophils Absolute 0.4 0.0 - 0.7 K/uL   Basophils Relative 0 0 - 1 %   Basophils Absolute 0.0 0.0 - 0.1 K/uL  Magnesium     Status: None   Collection Time: 01/23/15  2:45 AM  Result Value Ref Range   Magnesium 2.1 1.7 - 2.4 mg/dL  Basic metabolic panel     Status: Abnormal   Collection Time: 01/23/15  2:45 AM  Result Value Ref Range   Sodium 139 135 - 145 mmol/L   Potassium 2.8 (L) 3.5 - 5.1 mmol/L   Chloride 102 101 - 111 mmol/L   CO2 27 22 - 32 mmol/L   Glucose, Bld 146 (H) 70 - 99 mg/dL   BUN 6 6 - 20 mg/dL   Creatinine, Ser 0.89 0.44 - 1.00 mg/dL   Calcium 9.3 8.9 - 10.3 mg/dL   GFR calc non Af Amer >60 >60 mL/min   GFR calc Af Amer >60 >60 mL/min    Comment:  (NOTE) The eGFR has been calculated using the CKD EPI equation. This calculation has not been validated in all clinical situations. eGFR's persistently <90 mL/min signify possible Chronic Kidney Disease.    Anion gap 10 5 - 15  Glucose, capillary     Status: Abnormal   Collection Time: 01/23/15  4:29 AM  Result Value Ref Range   Glucose-Capillary 153 (H) 70 - 99 mg/dL  Glucose, capillary     Status: Abnormal   Collection Time: 01/23/15  7:52 AM  Result Value Ref Range   Glucose-Capillary 131 (H) 70 - 99 mg/dL   Comment 1 Notify RN    Comment 2 Document in Chart     Vitals: Blood pressure 144/92, pulse 112, temperature 98.3 F (36.8 C), temperature source Oral, resp. rate 19, weight 61.1 kg (134 lb 11.2 oz), SpO2 95 %.  Risk to Self:   Risk to Others:   Prior Inpatient Therapy:   Prior Outpatient Therapy:    Current Facility-Administered Medications  Medication Dose Route Frequency Provider Last Rate Last Dose  . ALPRAZolam Duanne Moron) tablet 0.25 mg  0.25 mg Oral TID PRN Wilhelmina Mcardle, MD   0.25 mg at 01/23/15 [phone]  . feeding supplement (JEVITY 1.2 CAL) liquid 1,000 mL  1,000 mL Per Tube Continuous Asencion Islam, RD   1,000 mL at 01/22/15 1645  . FLUoxetine (PROZAC) capsule 10 mg  10 mg Oral Daily Ambrose Finland, MD   10 mg at 01/22/15 1600  . folic acid (FOLVITE) tablet 1 mg  1 mg Oral Daily Donalynn Furlong [place], [facility]      . gabapentin (NEURONTIN) capsule 300 mg  300 mg Oral TID Ambrose Finland, MD   300 mg at 01/22/15 2139  . heparin injection 5,000 Units  5,000 Units Subcutaneous 3 times per day Rahul  Dianna Rossetti, PA-C   5,000 Units at 01/21/15 1549  . ondansetron (ZOFRAN) injection 4 mg  4 mg Intravenous Q6H PRN Raylene Miyamoto, MD   4 mg at 01/22/15 2140  . oxyCODONE (Oxy IR/ROXICODONE) immediate release tablet 5 mg  5 mg Oral Q4H PRN Wilhelmina Mcardle, MD   5 mg at 01/23/15 0957  . pantoprazole (PROTONIX) EC tablet 40 mg  40 mg Oral Q1200 Brand Males, MD   40  mg at 01/22/15 1300  . phenol (CHLORASEPTIC) mouth spray 1 spray  1 spray Mouth/Throat PRN Raylene Miyamoto, MD   1 spray at 01/22/15 2140  . potassium chloride SA (K-DUR,KLOR-CON) CR tablet 40 mEq  40 mEq Oral TID Allie Bossier, MD      . thiamine (VITAMIN B-1) tablet 100 mg  100 mg Oral Daily Alvira Philips, [facility]        Musculoskeletal: Strength & Muscle Tone: decreased Gait & Station: unable to stand Patient leans: N/A  Psychiatric Specialty Exam: Physical Exam:   ROS:   Blood pressure 144/92, pulse 112, temperature 98.3 F (36.8 C), temperature source Oral, resp. rate 19, weight 61.1 kg (134 lb 11.2 oz), SpO2 95 %.Body mass index is 25.46 kg/(m^2).  General Appearance: Guarded  Eye Contact::  Fair  Speech:  Clear and Coherent and Slow  Volume:  Decreased  Mood:  Depressed, Hopeless and Worthless  Affect:  Depressed and Tearful  Thought Process:  Coherent and Goal Directed  Orientation:  Full (Time, Place, and Person)  Thought Content:  Rumination  Suicidal Thoughts:  Yes.  with intent/plan  Homicidal Thoughts:  No  Memory:  Immediate;   Fair Recent;   Poor  Judgement:  Impaired  Insight:  Fair  Psychomotor Activity:  Decreased  Concentration:  Fair  Recall:  [employer] of Knowledge:Fair  Language: Good  Akathisia:  Negative  Handed:  Right  AIMS (if indicated):     Assets:  Communication Skills Desire for Improvement Leisure Time Social Support  ADL's:  Intact  Cognition: WNL  Sleep:      Medical Decision Making: Review of Psycho-Social Stressors (1), Review or order clinical lab tests (1), Review and summation of old records (2), Established Problem, Worsening (2), Review of Last Therapy Session (1), Review or order medicine tests (1), Review of Medication Regimen & Side Effects (2) and Review of New Medication or Change in Dosage (2)  Treatment Plan Summary: Daily contact with patient to assess and evaluate symptoms and progress in treatment and Medication  management  Plan:  Patient presented with increased symptoms of depression and suicidal ideation status post Tylenol overdose. Patient cannot contract for safety at this time and required crisis stabilization, safety monitoring and medication management for depression and anxiety  Case discussed with the psychiatric social service and staff RN regarding need of acute psychiatric hospitalization when medically stable.  Suicidal: Continue Air cabin crew  Depression: Fluoxetine 10 mg daily for depression   Generalized anxiety: gabapentin 300 mg 3 times daily for anxiety  Recommend psychiatric Inpatient admission when medically cleared. Supportive therapy provided about ongoing stressors.  Disposition: Refer to the psychiatric social service regarding appropriate psychiatric inpatient hospitalization when medically stable.   Aireana Ryland,JANARDHAHA R. 01/23/2015 10:23 AM

## 2015-01-23 NOTE — Progress Notes (Signed)
[facility] Progress Note Patient Name: [name] DOB: 15-Jan-1966 MRN: [id number]   Date of Service  01/23/2015  HPI/Events of Note  K+ 2.8  eICU Interventions  Kdur 40 mEq TID X 3 dose     Intervention Category Intermediate Interventions: Electrolyte abnormality - evaluation and management  Merton Border 01/23/2015, 5:10 AM

## 2015-01-23 NOTE — Progress Notes (Signed)
Patient's sister brought her cell phone and charger.  Because of suicide precautions, charger was placed in patient's chart. [place], [place]

## 2015-01-24 DIAGNOSIS — F329 Major depressive disorder, single episode, unspecified: Secondary | ICD-10-CM

## 2015-01-24 DIAGNOSIS — T43012A Poisoning by tricyclic antidepressants, intentional self-harm, initial encounter: Secondary | ICD-10-CM

## 2015-01-24 DIAGNOSIS — I4581 Long QT syndrome: Secondary | ICD-10-CM

## 2015-01-24 DIAGNOSIS — J9601 Acute respiratory failure with hypoxia: Secondary | ICD-10-CM

## 2015-01-24 DIAGNOSIS — T1491 Suicide attempt: Secondary | ICD-10-CM

## 2015-01-24 DIAGNOSIS — F1010 Alcohol abuse, uncomplicated: Secondary | ICD-10-CM

## 2015-01-24 DIAGNOSIS — F319 Bipolar disorder, unspecified: Secondary | ICD-10-CM

## 2015-01-24 LAB — CBC WITH DIFFERENTIAL/PLATELET
Basophils Absolute: 0 K/uL (ref 0.0–0.1)
Basophils Relative: 0 % (ref 0–1)
Eosinophils Absolute: 0.4 K/uL (ref 0.0–0.7)
Eosinophils Relative: 4 % (ref 0–5)
HCT: 35.4 % — ABNORMAL LOW (ref 36.0–46.0)
Hemoglobin: 11.7 g/dL — ABNORMAL LOW (ref 12.0–15.0)
Lymphocytes Relative: 48 % — ABNORMAL HIGH (ref 12–46)
Lymphs Abs: 3.8 K/uL (ref 0.7–4.0)
MCH: 30.9 pg (ref 26.0–34.0)
MCHC: 33.1 g/dL (ref 30.0–36.0)
MCV: 93.4 fL (ref 78.0–100.0)
Monocytes Absolute: 0.7 K/uL (ref 0.1–1.0)
Monocytes Relative: 8 % (ref 3–12)
Neutro Abs: 3.2 K/uL (ref 1.7–7.7)
Neutrophils Relative %: 40 % — ABNORMAL LOW (ref 43–77)
Platelets: 250 K/uL (ref 150–400)
RBC: 3.79 MIL/uL — ABNORMAL LOW (ref 3.87–5.11)
RDW: 12.3 % (ref 11.5–15.5)
WBC: 8 K/uL (ref 4.0–10.5)

## 2015-01-24 LAB — BASIC METABOLIC PANEL WITH GFR
Anion gap: 10 (ref 5–15)
BUN: 5 mg/dL — ABNORMAL LOW (ref 6–20)
CO2: 27 mmol/L (ref 22–32)
Calcium: 9.4 mg/dL (ref 8.9–10.3)
Chloride: 104 mmol/L (ref 101–111)
Creatinine, Ser: 0.71 mg/dL (ref 0.44–1.00)
GFR calc Af Amer: >60 mL/min
GFR calc non Af Amer: >60 mL/min
Glucose, Bld: 92 mg/dL (ref 70–99)
Potassium: 4.3 mmol/L (ref 3.5–5.1)
Sodium: 141 mmol/L (ref 135–145)

## 2015-01-24 LAB — MAGNESIUM: Magnesium: 2 mg/dL (ref 1.7–2.4)

## 2015-01-24 MED ORDER — BENZOCAINE 10 % MT GEL
Freq: Four times a day (QID) | OROMUCOSAL | Status: DC | PRN
  Administered 2015-01-24 – 2015-01-25 (×3): via OROMUCOSAL
  Filled 2015-01-24 (×2): qty 9.4

## 2015-01-24 NOTE — Progress Notes (Signed)
[facility]                                                                              Patient Demographics  [name], is a 49 y.o. female, DOB - [birth year], SHF:[id number]  Admit date - 01/19/2015   Admitting Physician Rigoberto Noel, MD  Outpatient Primary MD for the patient is No PCP Per Patient  LOS - 4   Chief Complaint  Patient presents with  . Drug Overdose      HPI on 01/20/2015 by Dr. Montey Hora and Dr. Kara Mead [name] is a 49 y.o. F with PMH as outlined below. She was brought to [facility] ED 5/1 by EMS after a bystander saw her slumped over in her car in a nearby parking lot. [facility] first responded and found her unresponsive with ? Convulsions. [facility] found a brown paper bag with aluminum foil and a spoon in her car. No street drugs were found. She was brought to ED where she remained unresponsive to sternal rub or ammonia inhalants. She was intubated for airway protection and PCCM was called for admission.  Of note, she has hx of admission for intentional tylenol overdose as part of suicide attempt. Last admission here for this was in 2015 where tylenol level was 492. On this admission, tylenol level was 140. UDS is pending.  She was seen in consultation by neurology. She apparently was just discharged from [facility] facility earlier this month and while there, she had a an EEG performed for concern of seizure vs pseudoseizures. EEG captured on of her typical events and demonstrated that it was not epileptic in nature. CT head was negative and neuro recommended obtaining ammonia and depakote levels. If pt has no improvement by AM, they will obtain MRI and EEG.  Assessment & Plan   Suicide attempt -Denies further suicidal ideations but complains of anxiety -When medically stable would IVC patient -Continue patient on suicide precaution  Acetaminophen overdose - with hx of intentional OD as part of suicide attempt -Multi medication overdose to  include Tylenol, amitriptyline, and Soma.  -Patient's liver enzymes within normal limits currently  -Patient received N-acetylcysteine with loading dose following by maintenance infusion.Pharmacy to monitor NAC. -Acetaminophen level<10 on 5/3.  Acute respiratory failure following acetaminophen/amitriptyline/soma overdose -Resolved  Respiratory acidosis -Resolved  Tobacco use disorder -Tobacco cessation counseling.  Prolonged QTc on admission  -Most likely secondary to amitriptyline overdose, resolved on subsequent EKG  Hypokalemia -Resolved, continue to monitor BMP and replace as needed   Hx psychiatric pseudoseizure, ETOH abuse, depression, anxiety -UDS positive for opiates and benzodiazepines which were expected. -Psychiatry consulted and recommended Fluoxetine 10 mg daily, gabapentin 300mg  TID for anxiety, and discontinued benzodiazepines  Bipolar disorder  -Per psychiatry -IVC when medically stable  Code Status: Full  Family Communication: None at bedside.  Disposition Plan: Admitted.  Pending inpatient psychiatry placement.  Patient medically stable for discharge.  Time Spent in minutes   30 minutes  Procedures/Significant  Events CXR 5/1 >>> no acute process CT head 5/1 >>> no acute process 5/1 - brought to ED after being found unresponsive. Admitted with tylenol overdose. 01/21/15: Extubated by eMD  Consults  Dr. Ambrose Finland (psychiatry)  DVT Prophylaxis  heparin  Lab Results  Component Value Date   PLT 250 01/24/2015    Medications  Scheduled Meds: . feeding supplement (RESOURCE BREEZE)  1 Container Oral TID BM  . FLUoxetine  10 mg Oral Daily  . folic acid  1 mg Oral Daily  . gabapentin  300 mg Oral TID  . heparin  5,000 Units Subcutaneous 3 times per day  . pantoprazole  40 mg Oral Q1200  . thiamine  100 mg Oral Daily   Continuous Infusions:  PRN Meds:.ondansetron (ZOFRAN) IV, oxyCODONE, phenol  Antibiotics     Anti-infectives    None      Subjective:   [name] seen and examined today.  Patient states she is "feeling ok".  She feels her anxiety is not controlled and cannot understand why she is not on any antianxiety medication.  She denies chest pain, shortness of breath, abdominal pain.  Currently she states she does not want to hurt herself.    Objective:   Filed Vitals:   01/23/15 2312 01/24/15 0148 01/24/15 0203 01/24/15 0553  BP: 168/94 150/97  152/93  Pulse: 95 91  91  Temp: 98.2 F (36.8 C) 98.5 F (36.9 C)  98 F (36.7 C)  TempSrc: Oral Oral  Oral  Resp: 18 18  18   Weight:   65.772 kg (145 lb) 65.726 kg (144 lb 14.4 oz)  SpO2: 97% 98%  99%    Wt Readings from Last 3 Encounters:  01/24/15 65.726 kg (144 lb 14.4 oz)  12/15/14 62.3 kg (137 lb 5.6 oz)  10/27/14 54.9 kg (121 lb 0.5 oz)     Intake/Output Summary (Last 24 hours) at 01/24/15 0758 Last data filed at 01/24/15 0548  Gross per 24 hour  Intake   1200 ml  Output      0 ml  Net   1200 ml    Exam  General: Well developed, well nourished, NAD, appears stated age  HEENT: NCAT, mucous membranes moist.   Cardiovascular: S1 S2 auscultated, no rubs, murmurs or gallops. Regular rate and rhythm.  Respiratory: Clear to auscultation bilaterally with equal chest rise  Abdomen: Soft, nontender, nondistended, + bowel sounds  Extremities: warm dry without cyanosis clubbing or edema  Neuro: AAOx3, nonfocal  Psych: Depressed mood and flat affect    Data Review   Micro Results Recent Results (from the past 240 hour(s))  MRSA PCR Screening     Status: None   Collection Time: 01/20/15  1:29 AM  Result Value Ref Range Status   MRSA by PCR NEGATIVE NEGATIVE Final    Comment:        The GeneXpert MRSA Assay (FDA approved for NASAL specimens only), is one component of a comprehensive MRSA colonization surveillance program. It is not intended to diagnose MRSA infection nor to guide or monitor treatment  for MRSA infections.     Radiology Reports Ct Head Wo Contrast  01/19/2015   CLINICAL DATA:  Found in car unresponsive  EXAM: CT HEAD WITHOUT CONTRAST  TECHNIQUE: Contiguous axial images were obtained from the base of the skull through the vertex without intravenous contrast.  COMPARISON:  12/10/2014  FINDINGS: There is no intracranial hemorrhage, mass or evidence of acute infarction. Gray matter and white matter are normal. The ventricles and basal cisterns appear unremarkable.  The bony structures are intact. The visible portions of the paranasal sinuses are clear.  IMPRESSION: Normal brain   Electronically Signed  By: Andreas Newport M.D.   On: 01/19/2015 23:40   Dg Chest Port 1 View  01/22/2015   CLINICAL DATA:  Common a overdose, respiratory failure  EXAM: PORTABLE CHEST - 1 VIEW  COMPARISON:  Portable chest x-ray of Jan 21, 2015  FINDINGS: There is been interval extubation of the trachea and esophagus. The lungs are reasonably well inflated. There is minimal subsegmental atelectasis at the bases. There is no pleural effusion. The cardiac silhouette is top-normal in size. The pulmonary vascularity is not engorged. The bony thorax is unremarkable.  IMPRESSION: Interval extubation of the trachea and esophagus. Minimal persistent bibasilar subsegmental atelectasis.   Electronically Signed   By: David  Martinique M.D.   On: 01/22/2015 07:36   Portable Chest Xray  01/21/2015   CLINICAL DATA:  Respiratory failure .  EXAM: PORTABLE CHEST - 1 VIEW  COMPARISON:  01/19/2015.  FINDINGS: Endotracheal tube in stable position. Interim placement of NG tube, its tip is projected over the stomach. Mediastinum hilar structures are normal. Bibasilar atelectasis and/or infiltrates again noted. Small bilateral pleural effusions cannot be excluded. No pneumothorax.  IMPRESSION: 1. Interim placement of NG tube, its tip is below the left hemidiaphragm. Endotracheal tube in stable position . 2. Stable bibasilar atelectasis  and/or infiltrates .   Electronically Signed   By: Marcello Moores  Register   On: 01/21/2015 07:25   Dg Chest Portable 1 View  01/19/2015   CLINICAL DATA:  Overdose, unresponsive  EXAM: PORTABLE CHEST - 1 VIEW  COMPARISON:  11/25/2014  FINDINGS: Endotracheal tube tip is 3.8 cm above the carina. There is minimal curvilinear atelectasis above the slightly elevated right hemidiaphragm. The lungs are otherwise clear. There is no large effusion. Hilar, mediastinal and cardiac contours appear unremarkable.  IMPRESSION: Satisfactory ET tube position. Minimal linear atelectatic appearing opacities in the right base.   Electronically Signed   By: Andreas Newport M.D.   On: 01/19/2015 23:27    CBC  Recent Labs Lab 01/19/15 2316 01/19/15 2318 01/21/15 0120 01/22/15 0240 01/23/15 0245 01/24/15 0538  WBC 8.6  --  9.6 9.1 7.7 8.0  HGB 12.2 13.6 11.4* 13.2 12.7 11.7*  HCT 37.3 40.0 34.8* 39.8 38.4 35.4*  PLT 249  --  204 237 246 250  MCV 95.6  --  97.5 94.5 95.0 93.4  MCH 31.3  --  31.9 31.4 31.4 30.9  MCHC 32.7  --  32.8 33.2 33.1 33.1  RDW 12.7  --  12.9 12.6 12.5 12.3  LYMPHSABS 5.0*  --   --   --  3.1 3.8  MONOABS 0.5  --   --   --  0.6 0.7  EOSABS 0.3  --   --   --  0.4 0.4  BASOSABS 0.0  --   --   --  0.0 0.0    Chemistries   Recent Labs Lab 01/19/15 2316  01/20/15 0228 01/21/15 0120 01/21/15 1245 01/22/15 0240 01/23/15 0245 01/23/15 1500 01/24/15 0538  NA 137  < > 138 136 144 142 139  --  141  K 3.3*  < > 3.7 4.0 3.7 3.1* 2.8* 3.3* 4.3  CL 103  < > 107 109 115* 105 102  --  104  CO2 23  --  22 18* 19* 24 27  --  27  GLUCOSE 119*  < > 124* 98 89 89 146*  --  92  BUN 10  < > 8 5* <5* <5* 6  --  5*  CREATININE 0.72  < >  0.55 0.63 0.57 0.59 0.89  --  0.71  CALCIUM 8.7*  --  7.8* 8.4* 8.8* 9.5 9.3  --  9.4  MG  --   --  1.7  --   --  1.9 2.1 1.8 2.0  AST 31  --  24 27 23   --  18  --   --   ALT 17  --  16 26 23   --  18  --   --   ALKPHOS 89  --  68 57 61  --  77  --   --   BILITOT  0.4  --  0.3 0.1* 0.6  --  0.4  --   --   < > = values in this interval not displayed. ------------------------------------------------------------------------------------------------------------------ estimated creatinine clearance is 74.7 mL/min (by C-G formula based on Cr of 0.71). ------------------------------------------------------------------------------------------------------------------ No results for input(s): HGBA1C in the last 72 hours. ------------------------------------------------------------------------------------------------------------------ No results for input(s): CHOL, HDL, LDLCALC, TRIG, CHOLHDL, LDLDIRECT in the last 72 hours. ------------------------------------------------------------------------------------------------------------------ No results for input(s): TSH, T4TOTAL, T3FREE, THYROIDAB in the last 72 hours.  Invalid input(s): FREET3 ------------------------------------------------------------------------------------------------------------------ No results for input(s): VITAMINB12, FOLATE, FERRITIN, TIBC, IRON, RETICCTPCT in the last 72 hours.  Coagulation profile  Recent Labs Lab 01/20/15 0228 01/21/15 1245 01/23/15 0245  INR 1.01 1.09 0.89    No results for input(s): DDIMER in the last 72 hours.  Cardiac Enzymes No results for input(s): CKMB, TROPONINI, MYOGLOBIN in the last 168 hours.  Invalid input(s): CK ------------------------------------------------------------------------------------------------------------------ Invalid input(s): POCBNP    Guenther Dunshee D.O. on 01/24/2015 at 7:58 AM  Between 7am to 7pm - Pager - [phone]  After 7pm go to [email or web address] - password [email or web address]  And look for the night coverage person covering for me after hours  [facility] Office  [phone]

## 2015-01-24 NOTE — Clinical Social Work Psych Note (Addendum)
Psych CSW received notification that patient was declined at [facility].  Psych CSW will continue bed search.  Psych CSW sent referrals to the following facilities: [facility]- declined [facility]- under review [facility]- denied [place]- at [place], [place] [phone]  Psychiatric & Orthopedics (5N 1-8) Clinical Social Worker

## 2015-01-25 LAB — CBC WITH DIFFERENTIAL/PLATELET
BASOS ABS: 0 10*3/uL (ref 0.0–0.1)
Basophils Relative: 0 % (ref 0–1)
EOS PCT: 4 % (ref 0–5)
Eosinophils Absolute: 0.3 10*3/uL (ref 0.0–0.7)
HEMATOCRIT: 35.9 % — AB (ref 36.0–46.0)
Hemoglobin: 12.1 g/dL (ref 12.0–15.0)
LYMPHS PCT: 51 % — AB (ref 12–46)
Lymphs Abs: 3.8 10*3/uL (ref 0.7–4.0)
MCH: 31.7 pg (ref 26.0–34.0)
MCHC: 33.7 g/dL (ref 30.0–36.0)
MCV: 94 fL (ref 78.0–100.0)
MONO ABS: 0.6 10*3/uL (ref 0.1–1.0)
Monocytes Relative: 7 % (ref 3–12)
Neutro Abs: 2.8 10*3/uL (ref 1.7–7.7)
Neutrophils Relative %: 38 % — ABNORMAL LOW (ref 43–77)
Platelets: 308 10*3/uL (ref 150–400)
RBC: 3.82 MIL/uL — ABNORMAL LOW (ref 3.87–5.11)
RDW: 12.6 % (ref 11.5–15.5)
WBC: 7.4 10*3/uL (ref 4.0–10.5)

## 2015-01-25 LAB — BASIC METABOLIC PANEL
Anion gap: 10 (ref 5–15)
BUN: 9 mg/dL (ref 6–20)
CO2: 27 mmol/L (ref 22–32)
CREATININE: 0.74 mg/dL (ref 0.44–1.00)
Calcium: 9.4 mg/dL (ref 8.9–10.3)
Chloride: 98 mmol/L — ABNORMAL LOW (ref 101–111)
GFR calc Af Amer: >60 mL/min (ref >60)
GFR calc non Af Amer: >60 mL/min (ref >60)
GLUCOSE: 81 mg/dL (ref 70–99)
Potassium: 4.4 mmol/L (ref 3.5–5.1)
Sodium: 135 mmol/L (ref 135–145)

## 2015-01-25 LAB — MAGNESIUM: Magnesium: 2 mg/dL (ref 1.7–2.4)

## 2015-01-25 MED ORDER — FLUOXETINE HCL 10 MG PO CAPS: 10.0000 mg | ORAL_CAPSULE | Freq: Every day | ORAL | Status: DC

## 2015-01-25 MED ORDER — BUTALBITAL-APAP-CAFFEINE 50-325-40 MG PO TABS
1.0000 | ORAL_TABLET | Freq: Four times a day (QID) | ORAL | Status: DC | PRN
  Administered 2015-01-25 – 2015-02-06 (×25): 1 via ORAL
  Filled 2015-01-25 (×27): qty 1

## 2015-01-25 MED ORDER — THIAMINE HCL 100 MG PO TABS: 100.0000 mg | ORAL_TABLET | Freq: Every day | ORAL | Status: DC

## 2015-01-25 MED ORDER — BOOST / RESOURCE BREEZE PO LIQD: 1.0000 | Freq: Three times a day (TID) | ORAL | Status: DC

## 2015-01-25 MED ORDER — FOLIC ACID 1 MG PO TABS: 1.0000 mg | ORAL_TABLET | Freq: Every day | ORAL | Status: DC

## 2015-01-25 MED ORDER — POLYETHYLENE GLYCOL 3350 17 G PO PACK
17.0000 g | PACK | Freq: Every day | ORAL | Status: DC
  Administered 2015-01-25 – 2015-01-31 (×7): 17 g via ORAL
  Filled 2015-01-25 (×7): qty 1

## 2015-01-25 MED ORDER — GABAPENTIN 300 MG PO CAPS: 300.0000 mg | ORAL_CAPSULE | Freq: Three times a day (TID) | ORAL | Status: DC

## 2015-01-25 NOTE — Consult Note (Signed)
Psychiatry Consult Follow up  Reason for Consult:  Depression, suicide attempt by overdose Referring Physician:  Dr. Chase Caller Patient Identification: [name] MRN:  [id number] Principal Diagnosis: Major depressive disorder-recurrent severe, without psychosis Diagnosis:   Patient Active Problem List   Diagnosis Date Noted  . Suicide attempt [T14.91]   . Amitriptyline overdose [T43.011A]   . Respiratory acidosis [E87.2]   . Prolonged Q-T interval on ECG [I45.81]   . Alcohol abuse [F10.10]   . Depression [F32.9]   . Anxiety state [F41.1]   . Bipolar I disorder, most recent episode (or current) unspecified [F31.9]   . Respiratory failure [J96.90]   . Seizure-like activity [R56.9] 12/11/2014  . Suicide attempt by hanging [T71.162A]   . MDD (major depressive disorder), recurrent severe, without psychosis [F33.2] 11/29/2014  . Panic disorder [F41.0] 11/29/2014  . Agoraphobia [F40.00] 11/29/2014  . ARF (acute renal failure) [N17.9] 11/25/2014  . Intentional acetaminophen overdose [T39.1X2A] 10/26/2014  . Major depressive disorder, recurrent, severe without psychotic features [F33.2]   . Insomnia [G47.00]   . Chronic pain syndrome [G89.4]   . Bipolar 1 disorder, mixed, severe [F31.63] 10/09/2014  . Acute blood loss anemia [D62] 10/07/2014  . Liver failure, acute [K72.00]   . GI bleed [K92.2] 10/06/2014  . Duodenal ulcer hemorrhage [K26.4] 10/06/2014  . Vomiting [R11.10] 10/05/2014  . Acute hepatitis [K72.00] 10/05/2014  . Acute renal insufficiency [N28.9] 10/05/2014  . Seizure disorder [G40.909] 10/05/2014  . Hyperammonemia [E72.20] 10/05/2014  . Suicidal ideation [R45.851] 10/05/2014  . Bipolar disorder [F31.9] 10/01/2014  . Major depression [F32.2] 10/01/2014  . Nausea and vomiting [R11.2] 09/30/2014  . Diarrhea [R19.7] 09/30/2014  . SIRS (systemic inflammatory response syndrome) [A41.9] 07/15/2014  . Tylenol overdose [T39.1X4A] 07/13/2014  . Hypokalemia [E87.6] 07/13/2014     Total Time spent with patient: 50 minutes  Subjective:   [name] is a 49 y.o. female patient admitted with tylenol overdose and AMS.  HPI:  [name] is a 49 y.o. Female admitted to [facility] after intentional overdose of Tylenol nearby parking lot at [facility] emergency department. Patient chart reviewed including neurology consultation and recommendations. Patient's serum is acetaminophen level is 140 on admission. Patient was found in her bed with the dysphoric affect and has a Air cabin crew at bedside. Patient reported she has been depressed, sad, tired, hopeless, helpless, worthless, overwhelmed and tried to end her life by taking Tylenol overdose. Patient has previous history of Tylenol overdose and at one point considered she needed liver transplant. She received ECT treatment during her hospitalization at [facility] in February 2016. Patient is known to this provider from her multiple hospitalizations both at [facility], [place], [facility] During the last hospitalization patient was referred to [facility] where she was treated and discharged home with her sister about 2 weeks ago. Patient reportedly came back to her condo apartment and then felt again depressed and becomes suicidal. Patient cannot contract for safety at this time. Patient has a history of seizure, pseudoseizures and received antiepileptic medication Keppra in the past. Patient has no family history of mental illness. Patient has no substance abuse history. Patient has 2 sisters who were tired off patient's mental and medical problems and constantly needed care. Patient has no family or children. Patient was not able to work over the years and struggling for financially. 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 Patient reported her  recent hospitalization at [facility] in [place], stated she was misdiagnosed and then discontinued some of her psychiatric medication but she does not remember what medications she was discharged to community. Will ask psychiatric social service to contact [facility] regarding discharge medication if possible  Interval History: 01/25/2015 Patient seen for psychiatric consultation, interviewed and chart reviewed. She has been stepped down to the Neuroscience unit from intensive care unit. She is alert and oriented to time, place, person and situation. However, she continues to report being overwhelmed and stressed out with life.Patient is endorses recurrent suicidal thoughts, hopelessness, helplessness, worthlessness, lack of motivation, anhedonia and difficulty sleeping. She reports long history of alcohol dependence until November 20, 2010 when she quit after a lot of help. Patient reports that she was first diagnosed with clinical depression in 20-Nov-1992 after her father died. She has had multiple suicide attempts since 2005/11/20 and as a result has been hospitalized in various psychiatric inpatient hospitals.Patient denies history of manic episode, delusions or psychosis. Today, she continues to verbalize suicidal thoughts with no specific plan. She reports that most of the medications tried in the past did not help except for Prozac and ECT received at [facility]. She is unable to contract for safety and agreed to inpatient admission for stabilization.  Past Medical History:  Past Medical History  Diagnosis Date  . Migraines   . Endometriosis   . ETOH abuse     sober 3 1/2 years  . Anorexia   . Depression   . Anxiety   . DJD (degenerative joint disease) of cervical spine   . Psychiatric pseudoseizure   . PICC (peripherally inserted central catheter) in place     rt neck  . Seizures     Past Surgical History  Procedure Laterality Date  . Knee surgery    . Abdominal surgery    . Cholecystectomy    .  Appendectomy    . Abdominal hysterectomy    . Nasal sinus surgery    . Laproscopy    . Carpal tunnel release      11/20/2008  . Esophagogastroduodenoscopy N/A 10/06/2014    Procedure: ESOPHAGOGASTRODUODENOSCOPY (EGD);  Surgeon: Inda Castle, MD;  Location: [place];  Service: Endoscopy;  Laterality: N/A;   Family History:  Family History  Problem Relation Age of Onset  . Hypertension Mother   . Hyperlipidemia Mother   . Heart failure Father   . Cancer Other    Social History:  History  Alcohol Use No     History  Drug Use No    History   Social History  . Marital Status: Single    Spouse Name: N/A  . Number of Children: N/A  . Years of Education: N/A   Social History Main Topics  . Smoking status: Current Every Day Smoker -- 0.25 packs/day for 20 years    Types: Cigarettes  . Smokeless tobacco: Never Used  . Alcohol Use: No  . Drug Use: No  . Sexual Activity: Not on file   Other Topics Concern  . None   Social History Narrative   Additional Social History: Patient lives by herself in a condo and has 2 sisters and her relationships were strained. She has no history of substance abuse.                          Allergies:   Allergies  Allergen Reactions  . Aspirin Anaphylaxis  .  Doxycycline Anaphylaxis and Other (See Comments)    Joint damage also  . Imitrex [Sumatriptan Base] Other (See Comments)    Severe hypertension  . Iodinated Diagnostic Agents Anaphylaxis  . Lidocaine Anaphylaxis    Pt states she does well with Marcaine/Bupivicaine without problem  . Prednisone Other (See Comments)    She goes crazy  . Sulfa Antibiotics Anaphylaxis  . Sulfasalazine Anaphylaxis  . Sumatriptan Other (See Comments)    Felt like she was "having a stroke", heart rate and blood pressure "went through the roof"  . Tramadol Other (See Comments)    seizures  . Levofloxacin Other (See Comments)    Joints hurt  . Latex Rash  . Levofloxacin Rash and Other (See  Comments)    Joints hurt  . Metrizamide Other (See Comments)  . Nsaids Rash and Other (See Comments)    GI bleed  . Tolmetin Rash    Labs:  Results for orders placed or performed during the hospital encounter of 01/19/15 (from the past 48 hour(s))  CBC with Differential/Platelet     Status: Abnormal   Collection Time: 01/24/15  5:38 AM  Result Value Ref Range   WBC 8.0 4.0 - 10.5 K/uL   RBC 3.79 (L) 3.87 - 5.11 MIL/uL   Hemoglobin 11.7 (L) 12.0 - 15.0 g/dL   HCT 35.4 (L) 36.0 - 46.0 %   MCV 93.4 78.0 - 100.0 fL   MCH 30.9 26.0 - 34.0 pg   MCHC 33.1 30.0 - 36.0 g/dL   RDW 12.3 11.5 - 15.5 %   Platelets 250 150 - 400 K/uL   Neutrophils Relative % 40 (L) 43 - 77 %   Neutro Abs 3.2 1.7 - 7.7 K/uL   Lymphocytes Relative 48 (H) 12 - 46 %   Lymphs Abs 3.8 0.7 - 4.0 K/uL   Monocytes Relative 8 3 - 12 %   Monocytes Absolute 0.7 0.1 - 1.0 K/uL   Eosinophils Relative 4 0 - 5 %   Eosinophils Absolute 0.4 0.0 - 0.7 K/uL   Basophils Relative 0 0 - 1 %   Basophils Absolute 0.0 0.0 - 0.1 K/uL  Magnesium     Status: None   Collection Time: 01/24/15  5:38 AM  Result Value Ref Range   Magnesium 2.0 1.7 - 2.4 mg/dL  Basic metabolic panel     Status: Abnormal   Collection Time: 01/24/15  5:38 AM  Result Value Ref Range   Sodium 141 135 - 145 mmol/L   Potassium 4.3 3.5 - 5.1 mmol/L    Comment: DELTA CHECK NOTED   Chloride 104 101 - 111 mmol/L   CO2 27 22 - 32 mmol/L   Glucose, Bld 92 70 - 99 mg/dL   BUN 5 (L) 6 - 20 mg/dL   Creatinine, Ser 0.71 0.44 - 1.00 mg/dL   Calcium 9.4 8.9 - 10.3 mg/dL   GFR calc non Af Amer >60 >60 mL/min   GFR calc Af Amer >60 >60 mL/min    Comment: (NOTE) The eGFR has been calculated using the CKD EPI equation. This calculation has not been validated in all clinical situations. eGFR's persistently <60 mL/min signify possible Chronic Kidney Disease.    Anion gap 10 5 - 15  CBC with Differential/Platelet     Status: Abnormal   Collection Time: 01/25/15   6:28 AM  Result Value Ref Range   WBC 7.4 4.0 - 10.5 K/uL   RBC 3.82 (L) 3.87 - 5.11 MIL/uL   Hemoglobin  12.1 12.0 - 15.0 g/dL   HCT 35.9 (L) 36.0 - 46.0 %   MCV 94.0 78.0 - 100.0 fL   MCH 31.7 26.0 - 34.0 pg   MCHC 33.7 30.0 - 36.0 g/dL   RDW 12.6 11.5 - 15.5 %   Platelets 308 150 - 400 K/uL   Neutrophils Relative % 38 (L) 43 - 77 %   Neutro Abs 2.8 1.7 - 7.7 K/uL   Lymphocytes Relative 51 (H) 12 - 46 %   Lymphs Abs 3.8 0.7 - 4.0 K/uL   Monocytes Relative 7 3 - 12 %   Monocytes Absolute 0.6 0.1 - 1.0 K/uL   Eosinophils Relative 4 0 - 5 %   Eosinophils Absolute 0.3 0.0 - 0.7 K/uL   Basophils Relative 0 0 - 1 %   Basophils Absolute 0.0 0.0 - 0.1 K/uL  Magnesium     Status: None   Collection Time: 01/25/15  6:28 AM  Result Value Ref Range   Magnesium 2.0 1.7 - 2.4 mg/dL  Basic metabolic panel     Status: Abnormal   Collection Time: 01/25/15  6:28 AM  Result Value Ref Range   Sodium 135 135 - 145 mmol/L   Potassium 4.4 3.5 - 5.1 mmol/L   Chloride 98 (L) 101 - 111 mmol/L   CO2 27 22 - 32 mmol/L   Glucose, Bld 81 70 - 99 mg/dL   BUN 9 6 - 20 mg/dL   Creatinine, Ser 0.74 0.44 - 1.00 mg/dL   Calcium 9.4 8.9 - 10.3 mg/dL   GFR calc non Af Amer >60 >60 mL/min   GFR calc Af Amer >60 >60 mL/min    Comment: (NOTE) The eGFR has been calculated using the CKD EPI equation. This calculation has not been validated in all clinical situations. eGFR's persistently <60 mL/min signify possible Chronic Kidney Disease.    Anion gap 10 5 - 15    Vitals: Blood pressure 124/77, pulse 108, temperature 98.1 F (36.7 C), temperature source Oral, resp. rate 18, weight 63.458 kg (139 lb 14.4 oz), SpO2 96 %.  Risk to Self:   Risk to Others:   Prior Inpatient Therapy:   Prior Outpatient Therapy:    Current Facility-Administered Medications  Medication Dose Route Frequency Provider Last Rate Last Dose  . benzocaine (ORAJEL) 10 % mucosal gel   Mouth/Throat QID PRN Cristal Ford, DO      .  butalbital-acetaminophen-caffeine (FIORICET, ESGIC) [phone] MG per tablet 1 tablet  1 tablet Oral Q6H PRN Cristal Ford, DO   1 tablet at 01/25/15 1102  . feeding supplement (RESOURCE BREEZE) (RESOURCE BREEZE) liquid 1 Container  1 Container Oral TID BM Asencion Islam, RD   1 Container at 01/25/15 [phone]  . FLUoxetine (PROZAC) capsule 10 mg  10 mg Oral Daily Ambrose Finland, MD   10 mg at 01/25/15 0953  . folic acid (FOLVITE) tablet 1 mg  1 mg Oral Daily Donalynn Furlong [place], RPH   1 mg at 01/25/15 9507  . gabapentin (NEURONTIN) capsule 300 mg  300 mg Oral TID Ambrose Finland, MD   300 mg at 01/25/15 0953  . heparin injection 5,000 Units  5,000 Units Subcutaneous 3 times per day Rahul Dianna Rossetti, PA-C   5,000 Units at 01/21/15 1549  . ondansetron (ZOFRAN) injection 4 mg  4 mg Intravenous Q6H PRN Raylene Miyamoto, MD   4 mg at 01/25/15 1333  . oxyCODONE (Oxy IR/ROXICODONE) immediate release tablet 5 mg  5 mg Oral  Q4H PRN Wilhelmina Mcardle, MD   5 mg at 01/25/15 0520  . pantoprazole (PROTONIX) EC tablet 40 mg  40 mg Oral Q1200 Brand Males, MD   40 mg at 01/25/15 1221  . phenol (CHLORASEPTIC) mouth spray 1 spray  1 spray Mouth/Throat PRN Raylene Miyamoto, MD   1 spray at 01/22/15 2140  . thiamine (VITAMIN B-1) tablet 100 mg  100 mg Oral Daily Donalynn Furlong [place], RPH   100 mg at 01/25/15 8875    Musculoskeletal: Strength & Muscle Tone: decreased Gait & Station: unable to stand Patient leans: N/A  Psychiatric Specialty Exam: Physical Exam:   ROS:   Blood pressure 124/77, pulse 108, temperature 98.1 F (36.7 C), temperature source Oral, resp. rate 18, weight 63.458 kg (139 lb 14.4 oz), SpO2 96 %.Body mass index is 26.45 kg/(m^2).  General Appearance: Casual and dressed in hospital gown  Eye Contact::  Fair  Speech:  Clear and Coherent and Slow  Volume:  Decreased  Mood:  Depressed, Hopeless and Worthless  Affect:  Depressed and Tearful  Thought Process:  Coherent and Goal  Directed  Orientation:  Full (Time, Place, and Person)  Thought Content:  Rumination  Suicidal Thoughts:  Yes.  with intent/plan  Homicidal Thoughts:  No  Memory:  Immediate;   Fair Recent;   Poor  Judgement:  Impaired  Insight:  Fair  Psychomotor Activity:  Decreased  Concentration:  Fair  Recall:  [employer] of Knowledge:Fair  Language: Good  Akathisia:  Negative  Handed:  Right  AIMS (if indicated):     Assets:  Communication Skills Desire for Improvement Leisure Time Social Support  ADL's:  Intact  Cognition: WNL  Sleep:   poor   Medical Decision Making: Review of Psycho-Social Stressors (1), Review or order clinical lab tests (1), Review and summation of old records (2), Established Problem, Worsening (2), Review of Last Therapy Session (1), Review or order medicine tests (1), Review of Medication Regimen & Side Effects (2) and Review of New Medication or Change in Dosage (2)  Treatment Plan Summary: Daily contact with patient to assess and evaluate symptoms and progress in treatment and Medication management  Plan:  Patient presented with increased symptoms of depression and suicidal ideation status post Tylenol overdose. Patient cannot contract for safety at this time and required crisis stabilization, safety monitoring and medication management for depression and anxiety  Case discussed with the psychiatric social service and staff RN regarding need of acute psychiatric hospitalization when medically stable.  Suicidal: Continue Air cabin crew  Depression: Continue  Fluoxetine 10 mg daily for depression   Generalized anxiety:  Continue Gabapentin 300 mg TID for anxiety  Recommend psychiatric Inpatient admission when medically cleared. Supportive therapy provided about ongoing stressors.  Disposition: Refer to the psychiatric social service regarding appropriate psychiatric inpatient hospitalization when medically stable. Patient prefer [facility] for inpatient  admission, states that is where she got the best care in the past.   Corena Pilgrim, MD 01/25/2015 3:10 PM

## 2015-01-25 NOTE — Discharge Summary (Signed)
Physician Discharge Summary  [name] ZOX:[id number] DOB: Feb 21, 1966 DOA: 01/19/2015  PCP: No PCP Per Patient  Admit date: 01/19/2015 Discharge date: 01/25/2015  Time spent: 45 minutes  Recommendations for Outpatient Follow-up:  Patient will be discharged to [facility].  Continue medications and therapy per rehab.  Follow up with [facility] when discharged from [facility].  Discharge Diagnoses:  Principal Problem:   Suicide attempt Active Problems:   Tylenol overdose   Respiratory failure   Amitriptyline overdose   Respiratory acidosis   Prolonged Q-T interval on ECG   Alcohol abuse   Depression   Anxiety state   Bipolar I disorder, most recent episode (or current) unspecified   Discharge Condition:Stable  Diet recommendation: Regular  Filed Weights   01/24/15 0203 01/24/15 0553 01/25/15 0524  Weight: 65.772 kg (145 lb) 65.726 kg (144 lb 14.4 oz) 63.458 kg (139 lb 14.4 oz)    History of present illness:  on 01/20/2015 by Dr. Montey Hora and Dr. Kara Mead Corlette Ciano is a 49 y.o. F with PMH as outlined below. She was brought to [facility] ED 5/1 by EMS after a bystander saw her slumped over in her car in a nearby parking lot. [facility] first responded and found her unresponsive with ? Convulsions. [facility] found a brown paper bag with aluminum foil and a spoon in her car. No street drugs were found. She was brought to ED where she remained unresponsive to sternal rub or ammonia inhalants. She was intubated for airway protection and PCCM was called for admission.  Of note, she has hx of admission for intentional tylenol overdose as part of suicide attempt. Last admission here for this was in 2015 where tylenol level was 492. On this admission, tylenol level was 140. UDS is pending.  She was seen in consultation by neurology. She apparently was just discharged from [place] facility earlier this month and while there, she had a an EEG performed for concern of seizure vs  pseudoseizures. EEG captured on of her typical events and demonstrated that it was not epileptic in nature. CT head was negative and neuro recommended obtaining ammonia and depakote levels. If pt has no improvement by AM, they will obtain MRI and EEG.   Hospital Course:  Suicide attempt -Denies further suicidal ideations but complains of anxiety -Continue patient on suicide precaution -Patient will be discharged to [facility] -patient is medically stable  Acetaminophen overdose - with hx of intentional OD as part of suicide attempt -Multi medication overdose to include Tylenol, amitriptyline, and Soma.  -Patient's liver enzymes within normal limits currently  -Patient received N-acetylcysteine with loading dose following by maintenance infusion.Pharmacy to monitor NAC. -Acetaminophen level<10 on 5/3.  Acute respiratory failure following acetaminophen/amitriptyline/soma overdose -Resolved  Respiratory acidosis -Resolved  Tobacco use disorder -Tobacco cessation counseling.  Prolonged QTc on admission  -Most likely secondary to amitriptyline overdose, resolved on subsequent EKG  Hypokalemia -Resolved, continue to monitor BMP and replace as needed   Major depressive disorder, psychiatric pseudoseizure, ETOH abuse, anxiety -UDS positive for opiates and benzodiazepines which were expected. -Psychiatry consulted and recommended Fluoxetine 10 mg daily, gabapentin 300mg  TID for anxiety, and discontinued benzodiazepines  Bipolar disorder  -Per psychiatry -IVC when medically stable  Procedures/Significant Events CXR 5/1 >>> no acute process CT head 5/1 >>> no acute process 5/1 - brought to ED after being found unresponsive. Admitted with tylenol overdose. 01/21/15: Extubated by eMD  Consults  Dr. Ambrose Finland (psychiatry)  Discharge Exam: Filed Vitals:   01/25/15  2048  BP: 133/84  Pulse: 98  Temp: 97.7 F (36.5 C)  Resp: 18   Exam  General: Well  developed, well nourished, no distress  HEENT: NCAT, mucous membranes moist.   Cardiovascular: S1 S2 auscultated, no murmurs, RRR  Respiratory: Clear to auscultation  Abdomen: Soft, nontender, nondistended, + bowel sounds  Extremities: warm dry without cyanosis clubbing or edema  Neuro: AAOx3, nonfocal  Discharge Instructions     Medication List    ASK your doctor about these medications        amitriptyline 50 MG tablet  Commonly known as:  ELAVIL  Take 50 mg by mouth at bedtime.     butalbital-acetaminophen-caffeine 50-325-40 MG per tablet  Commonly known as:  FIORICET, ESGIC  Take 1 tablet by mouth every 6 (six) hours as needed for headache.     CALCIUM-VITAMIN D3 PO  Take 1 capsule by mouth daily.     carisoprodol 350 MG tablet  Commonly known as:  SOMA  Take 1 tablet (350 mg total) by mouth at bedtime.     clonazePAM 1 MG tablet  Commonly known as:  KLONOPIN  Take 1 tablet (1 mg total) by mouth 3 (three) times daily as needed (anxiety).     diphenhydrAMINE 25 MG tablet  Commonly known as:  BENADRYL  Take 25 mg by mouth every 6 (six) hours as needed for itching or allergies.     divalproex 250 MG 24 hr tablet  Commonly known as:  DEPAKOTE ER  Take 500 mg by mouth at bedtime.     FLUoxetine 20 MG capsule  Commonly known as:  PROZAC  Take 4 capsules (80 mg total) by mouth daily.     gabapentin 100 MG capsule  Commonly known as:  NEURONTIN  Take 1 capsule (100 mg total) by mouth 3 (three) times daily.     hydrOXYzine 25 MG tablet  Commonly known as:  ATARAX/VISTARIL  Take 25 mg by mouth every 6 (six) hours as needed for itching.     isometheptene-acetaminophen-dichloralphenazone 65-325-100 MG capsule  Commonly known as:  MIDRIN  Take 1 capsule by mouth every 6 (six) hours as needed for migraine. Maximum 5 capsules in 12 hours for migraine headaches, 8 capsules in 24 hours for tension headaches.     levETIRAcetam 500 MG tablet  Commonly known as:   KEPPRA  Take 1 tablet (500 mg total) by mouth 2 (two) times daily.     lisinopril 20 MG tablet  Commonly known as:  PRINIVIL,ZESTRIL  Take 20 mg by mouth daily.     methocarbamol 750 MG tablet  Commonly known as:  ROBAXIN  Take 750 mg by mouth every 8 (eight) hours as needed for muscle spasms.     multivitamin with minerals Tabs tablet  Take 1 tablet by mouth daily.     pantoprazole 40 MG tablet  Commonly known as:  PROTONIX  Take 1 tablet (40 mg total) by mouth 2 (two) times daily.       Allergies  Allergen Reactions  . Aspirin Anaphylaxis  . Doxycycline Anaphylaxis and Other (See Comments)    Joint damage also  . Imitrex [Sumatriptan Base] Other (See Comments)    Severe hypertension  . Iodinated Diagnostic Agents Anaphylaxis  . Lidocaine Anaphylaxis    Pt states she does well with Marcaine/Bupivicaine without problem  . Prednisone Other (See Comments)    She goes crazy  . Sulfa Antibiotics Anaphylaxis  . Sulfasalazine Anaphylaxis  . Sumatriptan Other (See  Comments)    Felt like she was "having a stroke", heart rate and blood pressure "went through the roof"  . Tramadol Other (See Comments)    seizures  . Levofloxacin Other (See Comments)    Joints hurt  . Latex Rash  . Levofloxacin Rash and Other (See Comments)    Joints hurt  . Metrizamide Other (See Comments)  . Nsaids Rash and Other (See Comments)    GI bleed  . Tolmetin Rash      The results of significant diagnostics from this hospitalization (including imaging, microbiology, ancillary and laboratory) are listed below for reference.    Significant Diagnostic Studies: Ct Head Wo Contrast  01/19/2015   CLINICAL DATA:  Found in car unresponsive  EXAM: CT HEAD WITHOUT CONTRAST  TECHNIQUE: Contiguous axial images were obtained from the base of the skull through the vertex without intravenous contrast.  COMPARISON:  12/10/2014  FINDINGS: There is no intracranial hemorrhage, mass or evidence of acute infarction.  Gray matter and white matter are normal. The ventricles and basal cisterns appear unremarkable.  The bony structures are intact. The visible portions of the paranasal sinuses are clear.  IMPRESSION: Normal brain   Electronically Signed   By: Andreas Newport M.D.   On: 01/19/2015 23:40   Dg Chest Port 1 View  01/22/2015   CLINICAL DATA:  Common a overdose, respiratory failure  EXAM: PORTABLE CHEST - 1 VIEW  COMPARISON:  Portable chest x-ray of Jan 21, 2015  FINDINGS: There is been interval extubation of the trachea and esophagus. The lungs are reasonably well inflated. There is minimal subsegmental atelectasis at the bases. There is no pleural effusion. The cardiac silhouette is top-normal in size. The pulmonary vascularity is not engorged. The bony thorax is unremarkable.  IMPRESSION: Interval extubation of the trachea and esophagus. Minimal persistent bibasilar subsegmental atelectasis.   Electronically Signed   By: David  Martinique M.D.   On: 01/22/2015 07:36   Portable Chest Xray  01/21/2015   CLINICAL DATA:  Respiratory failure .  EXAM: PORTABLE CHEST - 1 VIEW  COMPARISON:  01/19/2015.  FINDINGS: Endotracheal tube in stable position. Interim placement of NG tube, its tip is projected over the stomach. Mediastinum hilar structures are normal. Bibasilar atelectasis and/or infiltrates again noted. Small bilateral pleural effusions cannot be excluded. No pneumothorax.  IMPRESSION: 1. Interim placement of NG tube, its tip is below the left hemidiaphragm. Endotracheal tube in stable position . 2. Stable bibasilar atelectasis and/or infiltrates .   Electronically Signed   By: Marcello Moores  Register   On: 01/21/2015 07:25   Dg Chest Portable 1 View  01/19/2015   CLINICAL DATA:  Overdose, unresponsive  EXAM: PORTABLE CHEST - 1 VIEW  COMPARISON:  11/25/2014  FINDINGS: Endotracheal tube tip is 3.8 cm above the carina. There is minimal curvilinear atelectasis above the slightly elevated right hemidiaphragm. The lungs are  otherwise clear. There is no large effusion. Hilar, mediastinal and cardiac contours appear unremarkable.  IMPRESSION: Satisfactory ET tube position. Minimal linear atelectatic appearing opacities in the right base.   Electronically Signed   By: Andreas Newport M.D.   On: 01/19/2015 23:27    Microbiology: Recent Results (from the past 240 hour(s))  MRSA PCR Screening     Status: None   Collection Time: 01/20/15  1:29 AM  Result Value Ref Range Status   MRSA by PCR NEGATIVE NEGATIVE Final    Comment:        The GeneXpert MRSA Assay (FDA approved for  NASAL specimens only), is one component of a comprehensive MRSA colonization surveillance program. It is not intended to diagnose MRSA infection nor to guide or monitor treatment for MRSA infections.      Labs: Basic Metabolic Panel:  Recent Labs Lab 01/20/15 0033  01/20/15 0228  01/21/15 1245 01/22/15 0240 01/23/15 0245 01/23/15 1500 01/24/15 0538 01/25/15 0628  NA  --   --  138  < > 144 142 139  --  141 135  K  --   --  3.7  < > 3.7 3.1* 2.8* 3.3* 4.3 4.4  CL  --   --  107  < > 115* 105 102  --  104 98*  CO2  --   --  22  < > 19* 24 27  --  27 27  GLUCOSE  --   --  124*  < > 89 89 146*  --  92 81  BUN  --   --  8  < > <5* <5* 6  --  5* 9  CREATININE  --   --  0.55  < > 0.57 0.59 0.89  --  0.71 0.74  CALCIUM  --   --  7.8*  < > 8.8* 9.5 9.3  --  9.4 9.4  MG  --   < > 1.7  --   --  1.9 2.1 1.8 2.0 2.0  PHOS 2.9  --  4.1  --   --  2.7  --   --   --   --   < > = values in this interval not displayed. Liver Function Tests:  Recent Labs Lab 01/19/15 2316 01/20/15 0228 01/21/15 0120 01/21/15 1245 01/23/15 0245  AST 31 24 27 23 18   ALT 17 16 26 23 18   ALKPHOS 89 68 57 61 77  BILITOT 0.4 0.3 0.1* 0.6 0.4  PROT 6.5 5.1* 5.0* 5.6* 6.5  ALBUMIN 3.9 3.0* 2.7* 2.9* 3.4*   No results for input(s): LIPASE, AMYLASE in the last 168 hours.  Recent Labs Lab 01/19/15 2316  AMMONIA 27   CBC:  Recent Labs Lab  01/19/15 2316  01/21/15 0120 01/22/15 0240 01/23/15 0245 01/24/15 0538 01/25/15 0628  WBC 8.6  --  9.6 9.1 7.7 8.0 7.4  NEUTROABS 2.7  --   --   --  3.6 3.2 2.8  HGB 12.2  < > 11.4* 13.2 12.7 11.7* 12.1  HCT 37.3  < > 34.8* 39.8 38.4 35.4* 35.9*  MCV 95.6  --  97.5 94.5 95.0 93.4 94.0  PLT 249  --  204 237 246 250 308  < > = values in this interval not displayed. Cardiac Enzymes:  Recent Labs Lab 01/19/15 2327  CKTOTAL 345*   BNP: BNP (last 3 results) No results for input(s): BNP in the last 8760 hours.  ProBNP (last 3 results) No results for input(s): PROBNP in the last 8760 hours.  CBG:  Recent Labs Lab 01/19/15 2236 01/22/15 1929 01/23/15 0039 01/23/15 0429 01/23/15 0752  GLUCAP 125* 160* 90 153* 131*       Signed:  Cristal Ford  [facility] 01/25/2015, 9:11 PM

## 2015-01-25 NOTE — Progress Notes (Signed)
[facility]                                                                              Patient Demographics  [name], is a 49 y.o. female, DOB - 08-Nov-1965, XNA:[id number]  Admit date - 01/19/2015   Admitting Physician Rigoberto Noel, MD  Outpatient Primary MD for the patient is No PCP Per Patient  LOS - 5   Chief Complaint  Patient presents with  . Drug Overdose      HPI on 01/20/2015 by Dr. Montey Hora and Dr. Kara Mead [name] is a 49 y.o. F with PMH as outlined below. She was brought to [facility] ED 5/1 by EMS after a bystander saw her slumped over in her car in a nearby parking lot. [facility] first responded and found her unresponsive with ? Convulsions. [facility] found a brown paper bag with aluminum foil and a spoon in her car. No street drugs were found. She was brought to ED where she remained unresponsive to sternal rub or ammonia inhalants. She was intubated for airway protection and PCCM was called for admission.  Of note, she has hx of admission for intentional tylenol overdose as part of suicide attempt. Last admission here for this was in 2015 where tylenol level was 492. On this admission, tylenol level was 140. UDS is pending.  She was seen in consultation by neurology. She apparently was just discharged from [place] facility earlier this month and while there, she had a an EEG performed for concern of seizure vs pseudoseizures. EEG captured on of her typical events and demonstrated that it was not epileptic in nature. CT head was negative and neuro recommended obtaining ammonia and depakote levels. If pt has no improvement by AM, they will obtain MRI and EEG.  Assessment & Plan   Suicide attempt -Denies further suicidal ideations but complains of anxiety -When medically stable would IVC patient -Continue patient on suicide precaution  Acetaminophen overdose - with hx of intentional OD as part of suicide attempt -Multi medication overdose to  include Tylenol, amitriptyline, and Soma.  -Patient's liver enzymes within normal limits currently  -Patient received N-acetylcysteine with loading dose following by maintenance infusion.Pharmacy to monitor NAC. -Acetaminophen level<10 on 5/3.  Acute respiratory failure following acetaminophen/amitriptyline/soma overdose -Resolved  Respiratory acidosis -Resolved  Tobacco use disorder -Tobacco cessation counseling.  Prolonged QTc on admission  -Most likely secondary to amitriptyline overdose, resolved on subsequent EKG  Hypokalemia -Resolved, continue to monitor BMP and replace as needed   Hx psychiatric pseudoseizure, ETOH abuse, depression, anxiety -UDS positive for opiates and benzodiazepines which were expected. -Psychiatry consulted and recommended Fluoxetine 10 mg daily, gabapentin 300mg  TID for anxiety, and discontinued benzodiazepines -Attempted to call psychiatry several times to discuss medication options, no returned call  Bipolar disorder  -Per psychiatry -IVC when medically stable  Code Status: Full  Family Communication: None at bedside.  Disposition Plan: Admitted.  Pending inpatient psychiatry placement.  Patient medically stable for discharge.  Time Spent in minutes   20 minutes  Procedures/Significant  Events CXR 5/1 >>> no acute process CT head 5/1 >>> no acute process 5/1 - brought to ED after being found  unresponsive. Admitted with tylenol overdose. 01/21/15: Extubated by eMD  Consults   Dr. Ambrose Finland (psychiatry)  DVT Prophylaxis  heparin  Lab Results  Component Value Date   PLT 308 01/25/2015    Medications  Scheduled Meds: . feeding supplement (RESOURCE BREEZE)  1 Container Oral TID BM  . FLUoxetine  10 mg Oral Daily  . folic acid  1 mg Oral Daily  . gabapentin  300 mg Oral TID  . heparin  5,000 Units Subcutaneous 3 times per day  . pantoprazole  40 mg Oral Q1200  . thiamine  100 mg Oral Daily   Continuous  Infusions:  PRN Meds:.benzocaine, ondansetron (ZOFRAN) IV, oxyCODONE, phenol  Antibiotics    Anti-infectives    None      Subjective:   [name] seen and examined today.  Patient states she is "feeling ok" and does not wish to harm herself.  She does ask about different medications and affects.  She states she cracked a tooth because she grinds due to her anxiety.    She denies chest pain, shortness of breath, abdominal pain.     Objective:   Filed Vitals:   01/24/15 1345 01/24/15 2108 01/25/15 0200 01/25/15 0524  BP: 135/94 137/91 145/89 165/85  Pulse: 104 100 95 76  Temp: 98.7 F (37.1 C) 98.1 F (36.7 C) 98 F (36.7 C) 97.8 F (36.6 C)  TempSrc: Oral Oral Oral Oral  Resp: 18 18 18 20   Weight:    63.458 kg (139 lb 14.4 oz)  SpO2: 98% 97% 96% 96%    Wt Readings from Last 3 Encounters:  01/25/15 63.458 kg (139 lb 14.4 oz)  12/15/14 62.3 kg (137 lb 5.6 oz)  10/27/14 54.9 kg (121 lb 0.5 oz)     Intake/Output Summary (Last 24 hours) at 01/25/15 0751 Last data filed at 01/24/15 1700  Gross per 24 hour  Intake    460 ml  Output      0 ml  Net    460 ml    Exam  General: Well developed, well nourished, no distress  HEENT: NCAT, mucous membranes moist.   Cardiovascular: S1 S2 auscultated, no murmurs, RRR  Respiratory: Clear to auscultation  Abdomen: Soft, nontender, nondistended, + bowel sounds  Extremities: warm dry without cyanosis clubbing or edema  Neuro: AAOx3, nonfocal  Psych: Appropriate mood and affect  Data Review   Micro Results Recent Results (from the past 240 hour(s))  MRSA PCR Screening     Status: None   Collection Time: 01/20/15  1:29 AM  Result Value Ref Range Status   MRSA by PCR NEGATIVE NEGATIVE Final    Comment:        The GeneXpert MRSA Assay (FDA approved for NASAL specimens only), is one component of a comprehensive MRSA colonization surveillance program. It is not intended to diagnose MRSA infection nor to guide  or monitor treatment for MRSA infections.     Radiology Reports Ct Head Wo Contrast  01/19/2015   CLINICAL DATA:  Found in car unresponsive  EXAM: CT HEAD WITHOUT CONTRAST  TECHNIQUE: Contiguous axial images were obtained from the base of the skull through the vertex without intravenous contrast.  COMPARISON:  12/10/2014  FINDINGS: There is no intracranial hemorrhage, mass or evidence of acute infarction. Gray matter and white matter are normal. The ventricles and basal cisterns appear unremarkable.  The bony structures are intact. The visible portions of the paranasal sinuses are clear.  IMPRESSION: Normal brain   Electronically  Signed   By: Andreas Newport M.D.   On: 01/19/2015 23:40   Dg Chest Port 1 View  01/22/2015   CLINICAL DATA:  Common a overdose, respiratory failure  EXAM: PORTABLE CHEST - 1 VIEW  COMPARISON:  Portable chest x-ray of Jan 21, 2015  FINDINGS: There is been interval extubation of the trachea and esophagus. The lungs are reasonably well inflated. There is minimal subsegmental atelectasis at the bases. There is no pleural effusion. The cardiac silhouette is top-normal in size. The pulmonary vascularity is not engorged. The bony thorax is unremarkable.  IMPRESSION: Interval extubation of the trachea and esophagus. Minimal persistent bibasilar subsegmental atelectasis.   Electronically Signed   By: David  Martinique M.D.   On: 01/22/2015 07:36   Portable Chest Xray  01/21/2015   CLINICAL DATA:  Respiratory failure .  EXAM: PORTABLE CHEST - 1 VIEW  COMPARISON:  01/19/2015.  FINDINGS: Endotracheal tube in stable position. Interim placement of NG tube, its tip is projected over the stomach. Mediastinum hilar structures are normal. Bibasilar atelectasis and/or infiltrates again noted. Small bilateral pleural effusions cannot be excluded. No pneumothorax.  IMPRESSION: 1. Interim placement of NG tube, its tip is below the left hemidiaphragm. Endotracheal tube in stable position . 2. Stable  bibasilar atelectasis and/or infiltrates .   Electronically Signed   By: Marcello Moores  Register   On: 01/21/2015 07:25   Dg Chest Portable 1 View  01/19/2015   CLINICAL DATA:  Overdose, unresponsive  EXAM: PORTABLE CHEST - 1 VIEW  COMPARISON:  11/25/2014  FINDINGS: Endotracheal tube tip is 3.8 cm above the carina. There is minimal curvilinear atelectasis above the slightly elevated right hemidiaphragm. The lungs are otherwise clear. There is no large effusion. Hilar, mediastinal and cardiac contours appear unremarkable.  IMPRESSION: Satisfactory ET tube position. Minimal linear atelectatic appearing opacities in the right base.   Electronically Signed   By: Andreas Newport M.D.   On: 01/19/2015 23:27    CBC  Recent Labs Lab 01/19/15 2316  01/21/15 0120 01/22/15 0240 01/23/15 0245 01/24/15 0538 01/25/15 0628  WBC 8.6  --  9.6 9.1 7.7 8.0 7.4  HGB 12.2  < > 11.4* 13.2 12.7 11.7* 12.1  HCT 37.3  < > 34.8* 39.8 38.4 35.4* 35.9*  PLT 249  --  204 237 246 250 308  MCV 95.6  --  97.5 94.5 95.0 93.4 94.0  MCH 31.3  --  31.9 31.4 31.4 30.9 31.7  MCHC 32.7  --  32.8 33.2 33.1 33.1 33.7  RDW 12.7  --  12.9 12.6 12.5 12.3 12.6  LYMPHSABS 5.0*  --   --   --  3.1 3.8 3.8  MONOABS 0.5  --   --   --  0.6 0.7 0.6  EOSABS 0.3  --   --   --  0.4 0.4 0.3  BASOSABS 0.0  --   --   --  0.0 0.0 0.0  < > = values in this interval not displayed.  Chemistries   Recent Labs Lab 01/19/15 2316  01/20/15 0228 01/21/15 0120 01/21/15 1245 01/22/15 0240 01/23/15 0245 01/23/15 1500 01/24/15 0538  NA 137  < > 138 136 144 142 139  --  141  K 3.3*  < > 3.7 4.0 3.7 3.1* 2.8* 3.3* 4.3  CL 103  < > 107 109 115* 105 102  --  104  CO2 23  --  22 18* 19* 24 27  --  27  GLUCOSE 119*  < >  124* 98 89 89 146*  --  92  BUN 10  < > 8 5* <5* <5* 6  --  5*  CREATININE 0.72  < > 0.55 0.63 0.57 0.59 0.89  --  0.71  CALCIUM 8.7*  --  7.8* 8.4* 8.8* 9.5 9.3  --  9.4  MG  --   --  1.7  --   --  1.9 2.1 1.8 2.0  AST 31  --  24  27 23   --  18  --   --   ALT 17  --  16 26 23   --  18  --   --   ALKPHOS 89  --  68 57 61  --  77  --   --   BILITOT 0.4  --  0.3 0.1* 0.6  --  0.4  --   --   < > = values in this interval not displayed. ------------------------------------------------------------------------------------------------------------------ estimated creatinine clearance is 73.4 mL/min (by C-G formula based on Cr of 0.71). ------------------------------------------------------------------------------------------------------------------ No results for input(s): HGBA1C in the last 72 hours. ------------------------------------------------------------------------------------------------------------------ No results for input(s): CHOL, HDL, LDLCALC, TRIG, CHOLHDL, LDLDIRECT in the last 72 hours. ------------------------------------------------------------------------------------------------------------------ No results for input(s): TSH, T4TOTAL, T3FREE, THYROIDAB in the last 72 hours.  Invalid input(s): FREET3 ------------------------------------------------------------------------------------------------------------------ No results for input(s): VITAMINB12, FOLATE, FERRITIN, TIBC, IRON, RETICCTPCT in the last 72 hours.  Coagulation profile  Recent Labs Lab 01/20/15 0228 01/21/15 1245 01/23/15 0245  INR 1.01 1.09 0.89    No results for input(s): DDIMER in the last 72 hours.  Cardiac Enzymes No results for input(s): CKMB, TROPONINI, MYOGLOBIN in the last 168 hours.  Invalid input(s): CK ------------------------------------------------------------------------------------------------------------------ Invalid input(s): POCBNP    Jonnelle Lawniczak D.O. on 01/25/2015 at 7:51 AM  Between 7am to 7pm - Pager - [phone]  After 7pm go to [email or web address] - password [email or web address]  And look for the night coverage person covering for me after hours  [facility] Office  [phone]

## 2015-01-26 LAB — BASIC METABOLIC PANEL WITH GFR
Anion gap: 11 (ref 5–15)
BUN: 10 mg/dL (ref 6–20)
CO2: 28 mmol/L (ref 22–32)
Calcium: 9.6 mg/dL (ref 8.9–10.3)
Chloride: 99 mmol/L — ABNORMAL LOW (ref 101–111)
Creatinine, Ser: 0.68 mg/dL (ref 0.44–1.00)
GFR calc Af Amer: >60 mL/min
GFR calc non Af Amer: >60 mL/min
Glucose, Bld: 78 mg/dL (ref 70–99)
Potassium: 4.7 mmol/L (ref 3.5–5.1)
Sodium: 138 mmol/L (ref 135–145)

## 2015-01-26 LAB — CBC WITH DIFFERENTIAL/PLATELET
BASOS PCT: 0 % (ref 0–1)
Basophils Absolute: 0 10*3/uL (ref 0.0–0.1)
EOS ABS: 0.4 10*3/uL (ref 0.0–0.7)
EOS PCT: 6 % — AB (ref 0–5)
HCT: 39.3 % (ref 36.0–46.0)
Hemoglobin: 13.4 g/dL (ref 12.0–15.0)
Lymphocytes Relative: 48 % — ABNORMAL HIGH (ref 12–46)
Lymphs Abs: 3.6 10*3/uL (ref 0.7–4.0)
MCH: 31.8 pg (ref 26.0–34.0)
MCHC: 34.1 g/dL (ref 30.0–36.0)
MCV: 93.3 fL (ref 78.0–100.0)
MONO ABS: 0.8 10*3/uL (ref 0.1–1.0)
Monocytes Relative: 10 % (ref 3–12)
NEUTROS ABS: 2.7 10*3/uL (ref 1.7–7.7)
NEUTROS PCT: 36 % — AB (ref 43–77)
Platelets: 308 10*3/uL (ref 150–400)
RBC: 4.21 MIL/uL (ref 3.87–5.11)
RDW: 12.6 % (ref 11.5–15.5)
WBC: 7.5 10*3/uL (ref 4.0–10.5)

## 2015-01-26 LAB — MAGNESIUM: Magnesium: 2.3 mg/dL (ref 1.7–2.4)

## 2015-01-26 MED ORDER — DIPHENHYDRAMINE HCL 25 MG PO CAPS
25.0000 mg | ORAL_CAPSULE | Freq: Once | ORAL | Status: DC
  Filled 2015-01-26 (×3): qty 1

## 2015-01-26 NOTE — Progress Notes (Signed)
[facility] had accepted the patient, however the patient was denied due to not having any current insurance.  Her past [employer] has expired and she is still pending [place] Medicaid.  CSW will continue to seek placement elsewhere.  [facility] CSW [phone]

## 2015-01-26 NOTE — Progress Notes (Signed)
[facility]                                                                              Patient Demographics  [name], is a 49 y.o. female, DOB - Nov 08, 1965, MOQ:[id number]  Admit date - 01/19/2015   Admitting Physician Rigoberto Noel, MD  Outpatient Primary MD for the patient is No PCP Per Patient  LOS - 6   Chief Complaint  Patient presents with  . Drug Overdose      HPI on 01/20/2015 by Dr. Montey Hora and Dr. Kara Mead Salle [name] is a 49 y.o. F with PMH as outlined below. She was brought to [facility] ED 5/1 by EMS after a bystander saw her slumped over in her car in a nearby parking lot. [facility] first responded and found her unresponsive with ? Convulsions. [employer] found a brown paper bag with aluminum foil and a spoon in her car. No street drugs were found. She was brought to ED where she remained unresponsive to sternal rub or ammonia inhalants. She was intubated for airway protection and PCCM was called for admission.  Of note, she has hx of admission for intentional tylenol overdose as part of suicide attempt. Last admission here for this was in 2015 where tylenol level was 492. On this admission, tylenol level was 140. UDS is pending.  She was seen in consultation by neurology. She apparently was just discharged from [facility] facility earlier this month and while there, she had a an EEG performed for concern of seizure vs pseudoseizures. EEG captured on of her typical events and demonstrated that it was not epileptic in nature. CT head was negative and neuro recommended obtaining ammonia and depakote levels. If pt has no improvement by AM, they will obtain MRI and EEG.  Assessment & Plan   Suicide attempt -Denies further suicidal ideations but complains of anxiety -Continue patient on suicide precaution -Patient is medically stable  Acetaminophen overdose - with hx of intentional OD as part of suicide attempt -Multi medication overdose to include  Tylenol, amitriptyline, and Soma.  -Patient's liver enzymes within normal limits currently  -Patient received N-acetylcysteine with loading dose following by maintenance infusion.Pharmacy to monitor NAC. -Acetaminophen level<10 on 5/3.  Acute respiratory failure following acetaminophen/amitriptyline/soma overdose -Resolved  Respiratory acidosis -Resolved  Tobacco use disorder -Tobacco cessation counseling.  Prolonged QTc on admission  -Most likely secondary to amitriptyline overdose, resolved on subsequent EKG  Hypokalemia -Resolved, continue to monitor BMP and replace as needed   Hx psychiatric pseudoseizure, ETOH abuse, depression, anxiety -UDS positive for opiates and benzodiazepines which were expected. -Psychiatry consulted and recommended Fluoxetine 10 mg daily, gabapentin 300mg  TID for anxiety, and discontinued benzodiazepines  Bipolar disorder  -Per psychiatry -IVC when medically stable  Code Status: Full  Family Communication: None at bedside.  Disposition Plan: Admitted.  Pending inpatient psychiatry placement.  Patient medically stable for discharge.  Time Spent in minutes   20 minutes  Procedures/Significant  Events CXR 5/1 >>> no acute process CT head 5/1 >>> no acute process 5/1 - brought to ED after being found unresponsive. Admitted with tylenol overdose. 01/21/15: Extubated by eMD  Consults   Dr. Arbutus Ped  Jonnalagadda (psychiatry)  DVT Prophylaxis  heparin  Lab Results  Component Value Date   PLT 308 01/26/2015    Medications  Scheduled Meds: . feeding supplement (RESOURCE BREEZE)  1 Container Oral TID BM  . FLUoxetine  10 mg Oral Daily  . folic acid  1 mg Oral Daily  . gabapentin  300 mg Oral TID  . heparin  5,000 Units Subcutaneous 3 times per day  . pantoprazole  40 mg Oral Q1200  . polyethylene glycol  17 g Oral Daily  . thiamine  100 mg Oral Daily   Continuous Infusions:  PRN Meds:.benzocaine,  butalbital-acetaminophen-caffeine, ondansetron (ZOFRAN) IV, oxyCODONE, phenol  Antibiotics    Anti-infectives    None      Subjective:   [name] seen and examined today.  Patient states she has not been feeling well and would like Keppra to sleep. She complains of continued grinding.  She denies chest pain, shortness of breath, abdominal pain.     Objective:   Filed Vitals:   01/25/15 2048 01/26/15 0509 01/26/15 0705 01/26/15 0909  BP: 133/84  154/82 118/75  Pulse: 98  74 103  Temp: 97.7 F (36.5 C)  97.8 F (36.6 C) 98.2 F (36.8 C)  TempSrc: Axillary  Oral Oral  Resp: 18  18 20   Weight:  61.417 kg (135 lb 6.4 oz)    SpO2: 97%  98% 98%    Wt Readings from Last 3 Encounters:  01/26/15 61.417 kg (135 lb 6.4 oz)  12/15/14 62.3 kg (137 lb 5.6 oz)  10/27/14 54.9 kg (121 lb 0.5 oz)     Intake/Output Summary (Last 24 hours) at 01/26/15 0958 Last data filed at 01/25/15 1223  Gross per 24 hour  Intake    360 ml  Output      0 ml  Net    360 ml    Exam  General: Well developed, well nourished, no distress  HEENT: NCAT, mucous membranes moist.   Cardiovascular: S1 S2 auscultated, no murmurs, RRR  Respiratory: Clear to auscultation  Abdomen: Soft, nontender, nondistended, + bowel sounds  Extremities: warm dry without cyanosis clubbing or edema  Neuro: AAOx3, nonfocal  Psych: Pleasant, Appropriate mood and affect  Data Review   Micro Results Recent Results (from the past 240 hour(s))  MRSA PCR Screening     Status: None   Collection Time: 01/20/15  1:29 AM  Result Value Ref Range Status   MRSA by PCR NEGATIVE NEGATIVE Final    Comment:        The GeneXpert MRSA Assay (FDA approved for NASAL specimens only), is one component of a comprehensive MRSA colonization surveillance program. It is not intended to diagnose MRSA infection nor to guide or monitor treatment for MRSA infections.     Radiology Reports Ct Head Wo Contrast  01/19/2015    CLINICAL DATA:  Found in car unresponsive  EXAM: CT HEAD WITHOUT CONTRAST  TECHNIQUE: Contiguous axial images were obtained from the base of the skull through the vertex without intravenous contrast.  COMPARISON:  12/10/2014  FINDINGS: There is no intracranial hemorrhage, mass or evidence of acute infarction. Gray matter and white matter are normal. The ventricles and basal cisterns appear unremarkable.  The bony structures are intact. The visible portions of the paranasal sinuses are clear.  IMPRESSION: Normal brain   Electronically Signed   By: Andreas Newport M.D.   On: 01/19/2015 23:40   Dg Chest Port 1 View  01/22/2015   CLINICAL DATA:  Common a overdose, respiratory failure  EXAM: PORTABLE CHEST - 1 VIEW  COMPARISON:  Portable chest x-ray of Jan 21, 2015  FINDINGS: There is been interval extubation of the trachea and esophagus. The lungs are reasonably well inflated. There is minimal subsegmental atelectasis at the bases. There is no pleural effusion. The cardiac silhouette is top-normal in size. The pulmonary vascularity is not engorged. The bony thorax is unremarkable.  IMPRESSION: Interval extubation of the trachea and esophagus. Minimal persistent bibasilar subsegmental atelectasis.   Electronically Signed   By: David  Martinique M.D.   On: 01/22/2015 07:36   Portable Chest Xray  01/21/2015   CLINICAL DATA:  Respiratory failure .  EXAM: PORTABLE CHEST - 1 VIEW  COMPARISON:  01/19/2015.  FINDINGS: Endotracheal tube in stable position. Interim placement of NG tube, its tip is projected over the stomach. Mediastinum hilar structures are normal. Bibasilar atelectasis and/or infiltrates again noted. Small bilateral pleural effusions cannot be excluded. No pneumothorax.  IMPRESSION: 1. Interim placement of NG tube, its tip is below the left hemidiaphragm. Endotracheal tube in stable position . 2. Stable bibasilar atelectasis and/or infiltrates .   Electronically Signed   By: Marcello Moores  Register   On: 01/21/2015  07:25   Dg Chest Portable 1 View  01/19/2015   CLINICAL DATA:  Overdose, unresponsive  EXAM: PORTABLE CHEST - 1 VIEW  COMPARISON:  11/25/2014  FINDINGS: Endotracheal tube tip is 3.8 cm above the carina. There is minimal curvilinear atelectasis above the slightly elevated right hemidiaphragm. The lungs are otherwise clear. There is no large effusion. Hilar, mediastinal and cardiac contours appear unremarkable.  IMPRESSION: Satisfactory ET tube position. Minimal linear atelectatic appearing opacities in the right base.   Electronically Signed   By: Andreas Newport M.D.   On: 01/19/2015 23:27    CBC  Recent Labs Lab 01/19/15 2316  01/22/15 0240 01/23/15 0245 01/24/15 0538 01/25/15 0628 01/26/15 0734  WBC 8.6  < > 9.1 7.7 8.0 7.4 7.5  HGB 12.2  < > 13.2 12.7 11.7* 12.1 13.4  HCT 37.3  < > 39.8 38.4 35.4* 35.9* 39.3  PLT 249  < > 237 246 250 308 308  MCV 95.6  < > 94.5 95.0 93.4 94.0 93.3  MCH 31.3  < > 31.4 31.4 30.9 31.7 31.8  MCHC 32.7  < > 33.2 33.1 33.1 33.7 34.1  RDW 12.7  < > 12.6 12.5 12.3 12.6 12.6  LYMPHSABS 5.0*  --   --  3.1 3.8 3.8 3.6  MONOABS 0.5  --   --  0.6 0.7 0.6 0.8  EOSABS 0.3  --   --  0.4 0.4 0.3 0.4  BASOSABS 0.0  --   --  0.0 0.0 0.0 0.0  < > = values in this interval not displayed.  Chemistries   Recent Labs Lab 01/19/15 2316  01/20/15 0228 01/21/15 0120 01/21/15 1245 01/22/15 0240 01/23/15 0245 01/23/15 1500 01/24/15 0538 01/25/15 0628 01/26/15 0734  NA 137  < > 138 136 144 142 139  --  141 135 138  K 3.3*  < > 3.7 4.0 3.7 3.1* 2.8* 3.3* 4.3 4.4 4.7  CL 103  < > 107 109 115* 105 102  --  104 98* 99*  CO2 23  --  22 18* 19* 24 27  --  27 27 28   GLUCOSE 119*  < > 124* 98 89 89 146*  --  92 81 78  BUN 10  < > 8 5* <5* <5* 6  --  5* 9 10  CREATININE 0.72  < > 0.55 0.63 0.57 0.59 0.89  --  0.71 0.74 0.68  CALCIUM 8.7*  --  7.8* 8.4* 8.8* 9.5 9.3  --  9.4 9.4 9.6  MG  --   < > 1.7  --   --  1.9 2.1 1.8 2.0 2.0 2.3  AST 31  --  24 27 23   --  18   --   --   --   --   ALT 17  --  16 26 23   --  18  --   --   --   --   ALKPHOS 89  --  68 57 61  --  77  --   --   --   --   BILITOT 0.4  --  0.3 0.1* 0.6  --  0.4  --   --   --   --   < > = values in this interval not displayed. ------------------------------------------------------------------------------------------------------------------ estimated creatinine clearance is 72.2 mL/min (by C-G formula based on Cr of 0.68). ------------------------------------------------------------------------------------------------------------------ No results for input(s): HGBA1C in the last 72 hours. ------------------------------------------------------------------------------------------------------------------ No results for input(s): CHOL, HDL, LDLCALC, TRIG, CHOLHDL, LDLDIRECT in the last 72 hours. ------------------------------------------------------------------------------------------------------------------ No results for input(s): TSH, T4TOTAL, T3FREE, THYROIDAB in the last 72 hours.  Invalid input(s): FREET3 ------------------------------------------------------------------------------------------------------------------ No results for input(s): VITAMINB12, FOLATE, FERRITIN, TIBC, IRON, RETICCTPCT in the last 72 hours.  Coagulation profile  Recent Labs Lab 01/20/15 0228 01/21/15 1245 01/23/15 0245  INR 1.01 1.09 0.89    No results for input(s): DDIMER in the last 72 hours.  Cardiac Enzymes No results for input(s): CKMB, TROPONINI, MYOGLOBIN in the last 168 hours.  Invalid input(s): CK ------------------------------------------------------------------------------------------------------------------ Invalid input(s): POCBNP    Jan Walters D.O. on 01/26/2015 at 9:58 AM  Between 7am to 7pm - Pager - [phone]  After 7pm go to [email or web address] - password [email or web address]  And look for the night coverage person covering for me after hours  [facility] Office   [phone]

## 2015-01-26 NOTE — Progress Notes (Signed)
CSW has faxed referrals for inpatient psych placement to [facility], [place].  CSW spoke to [facility] about patient transferring to [facility] since she is medically stable.  [facility] recommends higher level of care, CSW to complete [facility] referral. CSW got Auth# from [facility]: [id number] 01/26/15-02/01/15.   [facility] Jiovany Scheffel [facility] ED CSW [phone]

## 2015-01-27 ENCOUNTER — Encounter (HOSPITAL_COMMUNITY): Payer: Self-pay | Admitting: *Deleted

## 2015-01-27 MED ORDER — ONDANSETRON 4 MG PO TBDP
8.0000 mg | ORAL_TABLET | Freq: Three times a day (TID) | ORAL | Status: DC | PRN
  Administered 2015-01-27 – 2015-02-04 (×10): 8 mg via ORAL
  Filled 2015-01-27 (×10): qty 2

## 2015-01-27 NOTE — Clinical Social Work Psych Note (Addendum)
1:51pm- CSW faxed requested information to [place] at [facility] (labs, EKG)  12:19pm- Patient is under review at [facility]) for possible admission to waitlist (currently 18-25 days).  Psych CSW staffed medical readiness and barriers to placement with CSW Director and Airline pilot.  Psych CSW to continue to seek placement (other than [place]).  Nonnie Done, LCSW [phone]  Psychiatric & Orthopedics (5N 1-8) Clinical Social Worker

## 2015-01-27 NOTE — [facility] Counselor (Signed)
Writer left a Designer, television/film set message regarding for the Education officer, museum.  Per Haskell Flirt - Venetia Constable needs to be contacted regarding this patient for possible resources.

## 2015-01-27 NOTE — Progress Notes (Signed)
LCSW received call from Dr. Dwyane Dee and Ava from TTS regarding disposition for patient. Dr. Dwyane Dee is requesting patient be referred to [facility] due to lack of services and medicaid pending. Patient would be a good candidate for Care Coordination due to 8 admission in 6 months.  LCSW staffed case with unit CSW following psych who is agreeable and follow up with [facility] as patient was pending bed at [facility] last Friday.  Lane Hacker, MSW Clinical Social Work: Emergency Room [phone]

## 2015-01-27 NOTE — Progress Notes (Signed)
[facility]                                                                              Patient Demographics  [name], is a 49 y.o. female, DOB - Nov 01, 1965, FBP:[id number]  Admit date - 01/19/2015   Admitting Physician Rigoberto Noel, MD  Outpatient Primary MD for the patient is No PCP Per Patient  LOS - 7   Chief Complaint  Patient presents with  . Drug Overdose      HPI on 01/20/2015 by Dr. Montey Hora and Dr. Kara Mead [name] is a 49 y.o. F with PMH as outlined below. She was brought to [facility] ED 5/1 by EMS after a bystander saw her slumped over in her car in a nearby parking lot. [facility] first responded and found her unresponsive with ? Convulsions. [facility] found a brown paper bag with aluminum foil and a spoon in her car. No street drugs were found. She was brought to ED where she remained unresponsive to sternal rub or ammonia inhalants. She was intubated for airway protection and PCCM was called for admission.  Of note, she has hx of admission for intentional tylenol overdose as part of suicide attempt. Last admission here for this was in 2015 where tylenol level was 492. On this admission, tylenol level was 140. UDS is pending.  She was seen in consultation by neurology. She apparently was just discharged from [place] facility earlier this month and while there, she had a an EEG performed for concern of seizure vs pseudoseizures. EEG captured on of her typical events and demonstrated that it was not epileptic in nature. CT head was negative and neuro recommended obtaining ammonia and depakote levels. If pt has no improvement by AM, they will obtain MRI and EEG.  Assessment & Plan   Suicide attempt -Denies further suicidal ideations but complains of anxiety -Continue patient on suicide precaution -Patient is medically stable -Pending inpatient psych.  Per SW note: patient would be a good candidate for care coordination due to 8 admissions in 6  months.    Acetaminophen overdose - with hx of intentional OD as part of suicide attempt -Multi medication overdose to include Tylenol, amitriptyline, and Soma.  -Patient's liver enzymes within normal limits currently  -Patient received N-acetylcysteine with loading dose following by maintenance infusion.Pharmacy to monitor NAC. -Acetaminophen level<10 on 5/3.  Acute respiratory failure following acetaminophen/amitriptyline/soma overdose -Resolved  Respiratory acidosis -Resolved  Tobacco use disorder -Tobacco cessation counseling.  Prolonged QTc on admission  -Most likely secondary to amitriptyline overdose, resolved on subsequent EKG  Hypokalemia -Resolved, continue to monitor BMP and replace as needed   Hx psychiatric pseudoseizure, ETOH abuse, depression, anxiety -UDS positive for opiates and benzodiazepines which were expected. -Psychiatry consulted and recommended Fluoxetine 10 mg daily, gabapentin 300mg  TID for anxiety, and discontinued benzodiazepines  Bipolar disorder  -Per psychiatry -Per patient, she does not have a history of bipolar disorder  Code Status: Full  Family Communication: None at bedside.  Disposition Plan: Admitted.  Pending inpatient psychiatry placement.  Patient medically stable for discharge.  Time Spent in minutes   20 minutes  Procedures/Significant  Events CXR 5/1 >>> no  acute process CT head 5/1 >>> no acute process 5/1 - brought to ED after being found unresponsive. Admitted with tylenol overdose. 01/21/15: Extubated by eMD  Consults   Dr. Ambrose Finland (psychiatry)  DVT Prophylaxis  heparin  Lab Results  Component Value Date   PLT 308 01/26/2015    Medications  Scheduled Meds: . diphenhydrAMINE  25 mg Oral Once  . feeding supplement (RESOURCE BREEZE)  1 Container Oral TID BM  . FLUoxetine  10 mg Oral Daily  . folic acid  1 mg Oral Daily  . gabapentin  300 mg Oral TID  . heparin  5,000 Units Subcutaneous 3  times per day  . pantoprazole  40 mg Oral Q1200  . polyethylene glycol  17 g Oral Daily  . thiamine  100 mg Oral Daily   Continuous Infusions:  PRN Meds:.benzocaine, butalbital-acetaminophen-caffeine, ondansetron, oxyCODONE, phenol  Antibiotics    Anti-infectives    None      Subjective:   [name] seen and examined today.  Patient states she is not sleeping well and would like to have her medications restarted.  She denies chest pain, shortness of breath, abdominal pain.     Objective:   Filed Vitals:   01/26/15 1806 01/26/15 2049 01/27/15 0146 01/27/15 0640  BP: 133/83 136/85 125/65 142/81  Pulse:  94 82 80  Temp:  98.6 F (37 C) 97.7 F (36.5 C) 98.4 F (36.9 C)  TempSrc:  Oral Oral Oral  Resp:  18 20 18   Weight:      SpO2:  97% 97% 98%    Wt Readings from Last 3 Encounters:  01/26/15 61.417 kg (135 lb 6.4 oz)  12/15/14 62.3 kg (137 lb 5.6 oz)  10/27/14 54.9 kg (121 lb 0.5 oz)    No intake or output data in the 24 hours ending 01/27/15 1151  Exam  General: Well developed, well nourished, no distress  Cardiovascular: S1 S2 auscultated, no murmurs, RRR  Respiratory: Clear to auscultation  Abdomen: Soft, nontender, nondistended, + bowel sounds  Neuro: AAOx3, nonfocal  Psych: Appropriate mood and affect  Data Review   Micro Results Recent Results (from the past 240 hour(s))  MRSA PCR Screening     Status: None   Collection Time: 01/20/15  1:29 AM  Result Value Ref Range Status   MRSA by PCR NEGATIVE NEGATIVE Final    Comment:        The GeneXpert MRSA Assay (FDA approved for NASAL specimens only), is one component of a comprehensive MRSA colonization surveillance program. It is not intended to diagnose MRSA infection nor to guide or monitor treatment for MRSA infections.     Radiology Reports Ct Head Wo Contrast  01/19/2015   CLINICAL DATA:  Found in car unresponsive  EXAM: CT HEAD WITHOUT CONTRAST  TECHNIQUE: Contiguous axial images  were obtained from the base of the skull through the vertex without intravenous contrast.  COMPARISON:  12/10/2014  FINDINGS: There is no intracranial hemorrhage, mass or evidence of acute infarction. Gray matter and white matter are normal. The ventricles and basal cisterns appear unremarkable.  The bony structures are intact. The visible portions of the paranasal sinuses are clear.  IMPRESSION: Normal brain   Electronically Signed   By: Andreas Newport M.D.   On: 01/19/2015 23:40   Dg Chest Port 1 View  01/22/2015   CLINICAL DATA:  Common a overdose, respiratory failure  EXAM: PORTABLE CHEST - 1 VIEW  COMPARISON:  Portable chest x-ray of Jan 21, 2015  FINDINGS: There is been interval extubation of the trachea and esophagus. The lungs are reasonably well inflated. There is minimal subsegmental atelectasis at the bases. There is no pleural effusion. The cardiac silhouette is top-normal in size. The pulmonary vascularity is not engorged. The bony thorax is unremarkable.  IMPRESSION: Interval extubation of the trachea and esophagus. Minimal persistent bibasilar subsegmental atelectasis.   Electronically Signed   By: David  Martinique M.D.   On: 01/22/2015 07:36   Portable Chest Xray  01/21/2015   CLINICAL DATA:  Respiratory failure .  EXAM: PORTABLE CHEST - 1 VIEW  COMPARISON:  01/19/2015.  FINDINGS: Endotracheal tube in stable position. Interim placement of NG tube, its tip is projected over the stomach. Mediastinum hilar structures are normal. Bibasilar atelectasis and/or infiltrates again noted. Small bilateral pleural effusions cannot be excluded. No pneumothorax.  IMPRESSION: 1. Interim placement of NG tube, its tip is below the left hemidiaphragm. Endotracheal tube in stable position . 2. Stable bibasilar atelectasis and/or infiltrates .   Electronically Signed   By: Marcello Moores  Register   On: 01/21/2015 07:25   Dg Chest Portable 1 View  01/19/2015   CLINICAL DATA:  Overdose, unresponsive  EXAM: PORTABLE CHEST -  1 VIEW  COMPARISON:  11/25/2014  FINDINGS: Endotracheal tube tip is 3.8 cm above the carina. There is minimal curvilinear atelectasis above the slightly elevated right hemidiaphragm. The lungs are otherwise clear. There is no large effusion. Hilar, mediastinal and cardiac contours appear unremarkable.  IMPRESSION: Satisfactory ET tube position. Minimal linear atelectatic appearing opacities in the right base.   Electronically Signed   By: Andreas Newport M.D.   On: 01/19/2015 23:27    CBC  Recent Labs Lab 01/22/15 0240 01/23/15 0245 01/24/15 0538 01/25/15 0628 01/26/15 0734  WBC 9.1 7.7 8.0 7.4 7.5  HGB 13.2 12.7 11.7* 12.1 13.4  HCT 39.8 38.4 35.4* 35.9* 39.3  PLT 237 246 250 308 308  MCV 94.5 95.0 93.4 94.0 93.3  MCH 31.4 31.4 30.9 31.7 31.8  MCHC 33.2 33.1 33.1 33.7 34.1  RDW 12.6 12.5 12.3 12.6 12.6  LYMPHSABS  --  3.1 3.8 3.8 3.6  MONOABS  --  0.6 0.7 0.6 0.8  EOSABS  --  0.4 0.4 0.3 0.4  BASOSABS  --  0.0 0.0 0.0 0.0    Chemistries   Recent Labs Lab 01/21/15 0120 01/21/15 1245  01/22/15 0240 01/23/15 0245 01/23/15 1500 01/24/15 0538 01/25/15 0628 01/26/15 0734  NA 136 144  --  142 139  --  141 135 138  K 4.0 3.7  --  3.1* 2.8* 3.3* 4.3 4.4 4.7  CL 109 115*  --  105 102  --  104 98* 99*  CO2 18* 19*  --  24 27  --  27 27 28   GLUCOSE 98 89  --  89 146*  --  92 81 78  BUN 5* <5*  --  <5* 6  --  5* 9 10  CREATININE 0.63 0.57  --  0.59 0.89  --  0.71 0.74 0.68  CALCIUM 8.4* 8.8*  --  9.5 9.3  --  9.4 9.4 9.6  MG  --   --   < > 1.9 2.1 1.8 2.0 2.0 2.3  AST 27 23  --   --  18  --   --   --   --   ALT 26 23  --   --  18  --   --   --   --  ALKPHOS 57 61  --   --  77  --   --   --   --   BILITOT 0.1* 0.6  --   --  0.4  --   --   --   --   < > = values in this interval not displayed. ------------------------------------------------------------------------------------------------------------------ estimated creatinine clearance is 72.2 mL/min (by C-G formula based  on Cr of 0.68). ------------------------------------------------------------------------------------------------------------------ No results for input(s): HGBA1C in the last 72 hours. ------------------------------------------------------------------------------------------------------------------ No results for input(s): CHOL, HDL, LDLCALC, TRIG, CHOLHDL, LDLDIRECT in the last 72 hours. ------------------------------------------------------------------------------------------------------------------ No results for input(s): TSH, T4TOTAL, T3FREE, THYROIDAB in the last 72 hours.  Invalid input(s): FREET3 ------------------------------------------------------------------------------------------------------------------ No results for input(s): VITAMINB12, FOLATE, FERRITIN, TIBC, IRON, RETICCTPCT in the last 72 hours.  Coagulation profile  Recent Labs Lab 01/21/15 1245 01/23/15 0245  INR 1.09 0.89    No results for input(s): DDIMER in the last 72 hours.  Cardiac Enzymes No results for input(s): CKMB, TROPONINI, MYOGLOBIN in the last 168 hours.  Invalid input(s): CK ------------------------------------------------------------------------------------------------------------------ Invalid input(s): POCBNP    Marwin Primmer D.O. on 01/27/2015 at 11:51 AM  Between 7am to 7pm - Pager - [phone]  After 7pm go to [email or web address] - password [email or web address]  And look for the night coverage person covering for me after hours  [facility] Office  [phone]

## 2015-01-28 NOTE — Clinical Social Work Psych Note (Addendum)
Psych CSW met with patient at bedside.  Patient sitting upright, calm and cooperative.  Psych CSW inquired if patient could contract for safety. Patient states she remains suicidal and is flooded with overwhelming thoughts of ending her life when thinking about her life's stressors.  Patient became very tearful when speaking about her stressors: financial needs, lack of insurance, lives with sister, feels to be a burden, mother in nursing facility....  Psych CSW reviewed plans for disposition with patient: local [facility] placement vs. [facility]) current wait 11-22 days).  Medical Director and Psychiatrist both on board with this disposition.  Patient agreeable to long(er)-term psychiatric care at [facility].  Patient continues to be under review at [facility].  Nonnie Done, LCSW [phone]  Psychiatric & Orthopedics (5N 1-8) Clinical Social Worker

## 2015-01-28 NOTE — Consult Note (Signed)
Psychiatry Consult Follow up  Reason for Consult:  Depression, suicide attempt by overdose Referring Physician:  Dr. Ree Kida Patient Identification: [name] MRN:  [id number] Principal Diagnosis: Major depressive disorder-recurrent severe, without psychosis Diagnosis:   Patient Active Problem List   Diagnosis Date Noted  . Suicide attempt [T14.91]   . Amitriptyline overdose [T43.011A]   . Respiratory acidosis [E87.2]   . Prolonged Q-T interval on ECG [I45.81]   . Alcohol abuse [F10.10]   . Depression [F32.9]   . Anxiety state [F41.1]   . Bipolar I disorder, most recent episode (or current) unspecified [F31.9]   . Respiratory failure [J96.90]   . Seizure-like activity [R56.9] 12/11/2014  . Suicide attempt by hanging [T71.162A]   . MDD (major depressive disorder), recurrent severe, without psychosis [F33.2] 11/29/2014  . Panic disorder [F41.0] 11/29/2014  . Agoraphobia [F40.00] 11/29/2014  . ARF (acute renal failure) [N17.9] 11/25/2014  . Intentional acetaminophen overdose [T39.1X2A] 10/26/2014  . Major depressive disorder, recurrent, severe without psychotic features [F33.2]   . Insomnia [G47.00]   . Chronic pain syndrome [G89.4]   . Bipolar 1 disorder, mixed, severe [F31.63] 10/09/2014  . Acute blood loss anemia [D62] 10/07/2014  . Liver failure, acute [K72.00]   . GI bleed [K92.2] 10/06/2014  . Duodenal ulcer hemorrhage [K26.4] 10/06/2014  . Vomiting [R11.10] 10/05/2014  . Acute hepatitis [K72.00] 10/05/2014  . Acute renal insufficiency [N28.9] 10/05/2014  . Seizure disorder [G40.909] 10/05/2014  . Hyperammonemia [E72.20] 10/05/2014  . Suicidal ideation [R45.851] 10/05/2014  . Bipolar disorder [F31.9] 10/01/2014  . Major depression [F32.2] 10/01/2014  . Nausea and vomiting [R11.2] 09/30/2014  . Diarrhea [R19.7] 09/30/2014  . SIRS (systemic inflammatory response syndrome) [A41.9] 07/15/2014  . Tylenol overdose [T39.1X4A] 07/13/2014  . Hypokalemia [E87.6] 07/13/2014     Total Time spent with patient: 20 minutes  Subjective:   [name] is a 49 y.o. female patient admitted with tylenol overdose and AMS.  HPI:  [name] is a 49 y.o. Female admitted to [facility] after intentional overdose of Tylenol nearby parking lot at [facility] emergency department. Patient chart reviewed including neurology consultation and recommendations. Patient's serum is acetaminophen level is 140 on admission. Patient was found in her bed with the dysphoric affect and has a Air cabin crew at bedside. Patient reported she has been depressed, sad, tired, hopeless, helpless, worthless, overwhelmed and tried to end her life by taking Tylenol overdose. Patient has previous history of Tylenol overdose and at one point considered she needed liver transplant. She received ECT treatment during her hospitalization at [facility] in February 2016. Patient is known to this provider from her multiple hospitalizations both at [facility], [facility], [facility]. During the last hospitalization patient was referred to [facility] where she was treated and discharged home with her sister about 2 weeks ago. Patient reportedly came back to her condo apartment and then felt again depressed and becomes suicidal. Patient cannot contract for safety at this time. Patient has a history of seizure, pseudoseizures and received antiepileptic medication Keppra in the past. Patient has no family history of mental illness. Patient has no substance abuse history. Patient has 2 sisters who were tired off patient's mental and medical problems and constantly needed care. Patient has no family or children. Patient was not able to work over the years and struggling for financially. 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 Patient reported her  recent hospitalization at [facility] in [place], stated she was misdiagnosed and then discontinued some of her psychiatric medication but she does not remember what medications she was discharged to community. Will ask psychiatric social service to contact [facility] regarding discharge medication if possible  Interval History: 01/28/2015 Patient seen for psychiatric consultation follow-up today. Patient reportedly feeling scared, unhappy and unable to contract for safety during this evaluation. Patient stated she has been somewhat bored staying in her bed. Patient safety sitter is at bedside. She stepped down to the Neuroscience unit from intensive care unit. She continues to report being overwhelmed and stressed out with life.Patient is endorses recurrent suicidal thoughts, hopelessness, helplessness, worthlessness, lack of motivation, anhedonia and difficulty sleeping. Patient does not want to be pushed out of the hospital because of multiple suicidal attempts since Dec 31, 2005 and reportedly she does not ask for help does not talk to anybody before she makes the suicidal  attempts.  She reports long history of alcohol dependence until 2011-01-01 when she quit after a lot of help. Patient was first diagnosed with clinical depression in 12/31/92 after her father died. She has had multiple suicide attempts since 12/31/05 and as a result has been hospitalized in various psychiatric inpatient hospitals. Patient denies history of manic episode, delusions or psychosis. She continues to verbalize suicidal thoughts with no specific plan. She felt Prozac and ECT received at [facility] was helpful but not all other medications. She agreed to inpatient admission for stabilization.  Past Medical History:  Past Medical History  Diagnosis Date  . Migraines   . Endometriosis   . ETOH abuse     sober 3 1/2 years  . Anorexia   . Depression   . Anxiety   . DJD (degenerative joint disease) of cervical spine   . Psychiatric  pseudoseizure   . PICC (peripherally inserted central catheter) in place     rt neck  . Seizures     Past Surgical History  Procedure Laterality Date  . Knee surgery    . Abdominal surgery    . Cholecystectomy    . Appendectomy    . Abdominal hysterectomy    . Nasal sinus surgery    . Laproscopy    . Carpal tunnel release      12/31/08  . Esophagogastroduodenoscopy N/A 10/06/2014    Procedure: ESOPHAGOGASTRODUODENOSCOPY (EGD);  Surgeon: Inda Castle, MD;  Location: [place];  Service: Endoscopy;  Laterality: N/A;   Family History:  Family History  Problem Relation Age of Onset  . Hypertension Mother   . Hyperlipidemia Mother   . Heart failure Father   . Cancer Other    Social History:  History  Alcohol Use No     History  Drug Use No    History   Social History  . Marital Status: Single    Spouse Name: N/A  . Number of Children: N/A  . Years of Education: N/A   Social History Main Topics  . Smoking status: Current Every Day Smoker -- 0.25 packs/day for 20 years    Types: Cigarettes  . Smokeless tobacco: Never Used  . Alcohol Use: No  . Drug Use: No  . Sexual Activity: Not on file   Other Topics Concern  . None   Social History Narrative   Additional Social History: Patient lives by herself in a condo and has 2 sisters and her relationships were strained. She has no history of substance abuse.  Allergies:   Allergies  Allergen Reactions  . Aspirin Anaphylaxis  . Doxycycline Anaphylaxis and Other (See Comments)    Joint damage also  . Imitrex [Sumatriptan Base] Other (See Comments)    Severe hypertension  . Iodinated Diagnostic Agents Anaphylaxis  . Lidocaine Anaphylaxis    Pt states she does well with Marcaine/Bupivicaine without problem  . Prednisone Other (See Comments)    She goes crazy  . Sulfa Antibiotics Anaphylaxis  . Sulfasalazine Anaphylaxis  . Sumatriptan Other (See Comments)    Felt like she was  "having a stroke", heart rate and blood pressure "went through the roof"  . Tramadol Other (See Comments)    seizures  . Levofloxacin Other (See Comments)    Joints hurt  . Latex Rash  . Levofloxacin Rash and Other (See Comments)    Joints hurt  . Metrizamide Other (See Comments)  . Nsaids Rash and Other (See Comments)    GI bleed  . Tolmetin Rash    Labs:  No results found for this or any previous visit (from the past 48 hour(s)).  Vitals: Blood pressure 106/61, pulse 81, temperature 98.2 F (36.8 C), temperature source Oral, resp. rate 20, weight 62.188 kg (137 lb 1.6 oz), SpO2 99 %.  Risk to Self: Is patient at risk for suicide?: Yes Risk to Others:   Prior Inpatient Therapy:   Prior Outpatient Therapy:    Current Facility-Administered Medications  Medication Dose Route Frequency Provider Last Rate Last Dose  . benzocaine (ORAJEL) 10 % mucosal gel   Mouth/Throat QID PRN Cristal Ford, DO      . butalbital-acetaminophen-caffeine (FIORICET, ESGIC) [phone] MG per tablet 1 tablet  1 tablet Oral Q6H PRN Cristal Ford, DO   1 tablet at 01/27/15 1656  . diphenhydrAMINE (BENADRYL) capsule 25 mg  25 mg Oral Once Dianne Dun, NP   25 mg at 01/26/15 2200  . feeding supplement (RESOURCE BREEZE) (RESOURCE BREEZE) liquid 1 Container  1 Container Oral TID BM Asencion Islam, RD   1 Container at 01/28/15 [phone]  . FLUoxetine (PROZAC) capsule 10 mg  10 mg Oral Daily Ambrose Finland, MD   10 mg at 01/28/15 0945  . folic acid (FOLVITE) tablet 1 mg  1 mg Oral Daily Donalynn Furlong [place], RPH   1 mg at 01/28/15 0945  . gabapentin (NEURONTIN) capsule 300 mg  300 mg Oral TID Ambrose Finland, MD   300 mg at 01/28/15 0945  . heparin injection 5,000 Units  5,000 Units Subcutaneous 3 times per day Rahul Dianna Rossetti, PA-C   5,000 Units at 01/21/15 1549  . ondansetron (ZOFRAN-ODT) disintegrating tablet 8 mg  8 mg Oral Q8H PRN Maryann Mikhail, DO   8 mg at 01/28/15 0831  . oxyCODONE  (Oxy IR/ROXICODONE) immediate release tablet 5 mg  5 mg Oral Q4H PRN Wilhelmina Mcardle, MD   5 mg at 01/28/15 2440  . pantoprazole (PROTONIX) EC tablet 40 mg  40 mg Oral Q1200 Brand Males, MD   40 mg at 01/27/15 1148  . phenol (CHLORASEPTIC) mouth spray 1 spray  1 spray Mouth/Throat PRN Raylene Miyamoto, MD   1 spray at 01/22/15 2140  . polyethylene glycol (MIRALAX / GLYCOLAX) packet 17 g  17 g Oral Daily Maryann Mikhail, DO   17 g at 01/28/15 0945  . thiamine (VITAMIN B-1) tablet 100 mg  100 mg Oral Daily Donalynn Furlong [place], RPH   100 mg at 01/28/15 0945    Musculoskeletal: Strength &  Muscle Tone: decreased Gait & Station: unable to stand Patient leans: N/A  Psychiatric Specialty Exam: Physical Exam:   ROS:   Blood pressure 106/61, pulse 81, temperature 98.2 F (36.8 C), temperature source Oral, resp. rate 20, weight 62.188 kg (137 lb 1.6 oz), SpO2 99 %.Body mass index is 25.92 kg/(m^2).  General Appearance: Casual and dressed in hospital gown  Eye Contact::  Fair  Speech:  Clear and Coherent and Slow  Volume:  Decreased  Mood:  Depressed, Hopeless and Worthless  Affect:  Depressed and Tearful  Thought Process:  Coherent and Goal Directed  Orientation:  Full (Time, Place, and Person)  Thought Content:  Rumination  Suicidal Thoughts:  Yes.  with intent/plan  Homicidal Thoughts:  No  Memory:  Immediate;   Fair Recent;   Poor  Judgement:  Impaired  Insight:  Fair  Psychomotor Activity:  Decreased  Concentration:  Fair  Recall:  [employer] of Knowledge:Fair  Language: Good  Akathisia:  Negative  Handed:  Right  AIMS (if indicated):     Assets:  Communication Skills Desire for Improvement Leisure Time Social Support  ADL's:  Intact  Cognition: WNL  Sleep:   poor   Medical Decision Making: Review of Psycho-Social Stressors (1), Review or order clinical lab tests (1), Review and summation of old records (2), Established Problem, Worsening (2), Review of Last Therapy  Session (1), Review or order medicine tests (1), Review of Medication Regimen & Side Effects (2) and Review of New Medication or Change in Dosage (2)  Treatment Plan Summary: Daily contact with patient to assess and evaluate symptoms and progress in treatment and Medication management  Plan:  Patient presented with increased symptoms of depression and suicidal ideation status post Tylenol overdose. Patient cannot contract for safety at this time and required crisis stabilization, safety monitoring and medication management for depression and anxiety  Case discussed with the psychiatric social service and staff RN regarding need of acute psychiatric hospitalization when medically stable.  Suicidal: Continue Air cabin crew  Depression: Continue  Fluoxetine 10 mg daily for depression   Generalized anxiety:  Continue Gabapentin 300 mg TID for anxiety  Recommend psychiatric Inpatient admission when medically cleared. Supportive therapy provided about ongoing stressors.  Disposition: Refer to the psychiatric social service regarding appropriate psychiatric inpatient hospitalization when medically stable. [facility] as patient is refused by local hospitals as per psychiatric social service.   Durward Parcel., MD 01/28/2015 10:44 AM

## 2015-01-28 NOTE — Progress Notes (Signed)
Rec'd call from nurse as pt was asking for  [facility]. Offered to assist a pay a visit. Pt was sitting up in bed when I arrived. She explained she tried to commit suicide for the 5th time since October. Pt said she is fearful but could not express the source of her fear. As we visited we both remembered our previous visit at [facility] where she was a patient and moved on to [facility]. I continued to listen as pt described her relationships with her mom, sister, and she talked about a break-up of her boyfriend over a year ago. Pt said she just wants them to get to the bottom of why she feels the way she does and why she keeps wanting to do this (commit suicide). She said she is fearful that if she does not get the right meds she may try again. She said she gets to point of feeling overwhelmed. She said she has also talked with SW. Pt said she feels bad about taking up this nice room that someone else could use. I tried to assure her that we want her here so we can take care of her. She expresses regret of not having her mother to take care of. She is in a memory facility. Pt seems to struggle with finding her way since she is no longer taking care of her mom. She admitted it is hard for her to ask for help and she would rather take care of someone else.  Pt said she was taught to take care of others rather than herself. Pt and I discussed the importance of taking care of herself first and then others. I listened and provided encouragement as appropriate. Pt was very thankful for visit and prayer. I told her I would let the unit Chaplain know she is here. She was concerned about different Chaplain visiting her but agreed to a visit.  Ernest Haber Chaplain   01/28/15 1500  Clinical Encounter Type  Visited With Patient;Health care provider

## 2015-01-28 NOTE — Progress Notes (Addendum)
[facility]                                                                              Patient Demographics  [name], is a 49 y.o. female, DOB - 04/19/1966, IPJ:[id number]  Admit date - 01/19/2015   Admitting Physician Rigoberto Noel, MD  Outpatient Primary MD for the patient is No PCP Per Patient  LOS - 8   Chief Complaint  Patient presents with  . Drug Overdose      HPI on 01/20/2015 by Dr. Montey Hora and Dr. Kara Mead [name] is a 49 y.o. F with PMH as outlined below. She was brought to [facility] ED 5/1 by EMS after a bystander saw her slumped over in her car in a nearby parking lot. [facility] first responded and found her unresponsive with ? Convulsions. [facility] found a brown paper bag with aluminum foil and a spoon in her car. No street drugs were found. She was brought to ED where she remained unresponsive to sternal rub or ammonia inhalants. She was intubated for airway protection and PCCM was called for admission.  Of note, she has hx of admission for intentional tylenol overdose as part of suicide attempt. Last admission here for this was in 2015 where tylenol level was 492. On this admission, tylenol level was 140. UDS is pending.  She was seen in consultation by neurology. She apparently was just discharged from [facility] facility earlier this month and while there, she had a an EEG performed for concern of seizure vs pseudoseizures. EEG captured on of her typical events and demonstrated that it was not epileptic in nature. CT head was negative and neuro recommended obtaining ammonia and depakote levels. If pt has no improvement by AM, they will obtain MRI and EEG.  Interim history Psychiatry consulted, pending placement for inpatient psych.  Pateint has had multiple admissions to psych units.    Assessment & Plan   Suicide attempt -Denies further suicidal ideations but complains of anxiety -Continue patient on suicide precaution -Patient is  medically stable -Pending inpatient psych.  Per SW note: patient would be a good candidate for care coordination due to 8 admissions in 6 months.   -Spoke with Dr. Louretta Shorten who stated that patient is constantly looking for more and more medications  Acetaminophen overdose - with hx of intentional OD as part of suicide attempt -Multi medication overdose to include Tylenol, amitriptyline, and Soma.  -Patient's liver enzymes within normal limits currently  -Patient received N-acetylcysteine with loading dose following by maintenance infusion.Pharmacy to monitor NAC. -Acetaminophen level<10 on 5/3.  Acute respiratory failure following acetaminophen/amitriptyline/soma overdose -Resolved  Respiratory acidosis -Resolved  Tobacco use disorder -Tobacco cessation counseling.  Prolonged QTc on admission  -Most likely secondary to amitriptyline overdose, resolved on subsequent EKG  Hypokalemia -Resolved, continue to monitor BMP and replace as needed   Hx psychiatric pseudoseizure, ETOH abuse, depression, anxiety -UDS positive for opiates and benzodiazepines which were expected. -Psychiatry consulted and recommended Fluoxetine 10 mg daily, gabapentin 300mg  TID for anxiety, and discontinued benzodiazepines  Bipolar disorder  -Per psychiatry -Per patient, she does not have a history of bipolar disorder  Code Status: Full  Family Communication: None at bedside.  Disposition Plan: Admitted.  Pending inpatient psychiatry placement.  Patient medically stable for discharge.  Spoke with [facility], however, due to lack of insurance, patient not accepted.  Currently on wait list.   Time Spent in minutes   20 minutes  Procedures/Significant  Events CXR 5/1 >>> no acute process CT head 5/1 >>> no acute process 5/1 - brought to ED after being found unresponsive. Admitted with tylenol overdose. 01/21/15: Extubated by eMD  Consults   Dr. Ambrose Finland (psychiatry)  DVT  Prophylaxis  Patient ambulatory   Lab Results  Component Value Date   PLT 308 01/26/2015    Medications  Scheduled Meds: . diphenhydrAMINE  25 mg Oral Once  . feeding supplement (RESOURCE BREEZE)  1 Container Oral TID BM  . FLUoxetine  10 mg Oral Daily  . folic acid  1 mg Oral Daily  . gabapentin  300 mg Oral TID  . heparin  5,000 Units Subcutaneous 3 times per day  . pantoprazole  40 mg Oral Q1200  . polyethylene glycol  17 g Oral Daily  . thiamine  100 mg Oral Daily   Continuous Infusions:  PRN Meds:.benzocaine, butalbital-acetaminophen-caffeine, ondansetron, oxyCODONE, phenol  Antibiotics    Anti-infectives    None      Subjective:   [name] seen and examined today.  Patient does not understand why she has not been transferred to a facility.  She currently feels depressed and states she is not sleeping well.  She continues to ask for medications.  She denies chest pain, shortness of breath, abdominal pain.     Objective:   Filed Vitals:   01/28/15 0203 01/28/15 0500 01/28/15 0637 01/28/15 1017  BP: 108/74  104/68 106/61  Pulse: 94  98 81  Temp: 97.8 F (36.6 C)  97.8 F (36.6 C) 98.2 F (36.8 C)  TempSrc: Oral  Oral Oral  Resp: 18  18 20   Weight:  62.188 kg (137 lb 1.6 oz)    SpO2: 98%  98% 99%    Wt Readings from Last 3 Encounters:  01/28/15 62.188 kg (137 lb 1.6 oz)  12/15/14 62.3 kg (137 lb 5.6 oz)  10/27/14 54.9 kg (121 lb 0.5 oz)     Intake/Output Summary (Last 24 hours) at 01/28/15 1047 Last data filed at 01/27/15 1800  Gross per 24 hour  Intake    360 ml  Output      0 ml  Net    360 ml    Exam  General: Well developed, well nourished, no distress  HEENT: NCAT, mucous membranes moist.  Cardiovascular: S1 S2 auscultated, no murmurs, RRR  Respiratory: Clear to auscultation  Abdomen: Soft, nontender, nondistended, + bowel sounds  Neuro: AAOx3, nonfocal  Psych: Depressed  Data Review   Micro Results Recent Results (from the  past 240 hour(s))  MRSA PCR Screening     Status: None   Collection Time: 01/20/15  1:29 AM  Result Value Ref Range Status   MRSA by PCR NEGATIVE NEGATIVE Final    Comment:        The GeneXpert MRSA Assay (FDA approved for NASAL specimens only), is one component of a comprehensive MRSA colonization surveillance program. It is not intended to diagnose MRSA infection nor to guide or monitor treatment for MRSA infections.     Radiology Reports Ct Head Wo Contrast  01/19/2015   CLINICAL DATA:  Found in car unresponsive  EXAM: CT HEAD WITHOUT CONTRAST  TECHNIQUE: Contiguous  axial images were obtained from the base of the skull through the vertex without intravenous contrast.  COMPARISON:  12/10/2014  FINDINGS: There is no intracranial hemorrhage, mass or evidence of acute infarction. Gray matter and white matter are normal. The ventricles and basal cisterns appear unremarkable.  The bony structures are intact. The visible portions of the paranasal sinuses are clear.  IMPRESSION: Normal brain   Electronically Signed   By: Andreas Newport M.D.   On: 01/19/2015 23:40   Dg Chest Port 1 View  01/22/2015   CLINICAL DATA:  Common a overdose, respiratory failure  EXAM: PORTABLE CHEST - 1 VIEW  COMPARISON:  Portable chest x-ray of Jan 21, 2015  FINDINGS: There is been interval extubation of the trachea and esophagus. The lungs are reasonably well inflated. There is minimal subsegmental atelectasis at the bases. There is no pleural effusion. The cardiac silhouette is top-normal in size. The pulmonary vascularity is not engorged. The bony thorax is unremarkable.  IMPRESSION: Interval extubation of the trachea and esophagus. Minimal persistent bibasilar subsegmental atelectasis.   Electronically Signed   By: David  Martinique M.D.   On: 01/22/2015 07:36   Portable Chest Xray  01/21/2015   CLINICAL DATA:  Respiratory failure .  EXAM: PORTABLE CHEST - 1 VIEW  COMPARISON:  01/19/2015.  FINDINGS: Endotracheal tube in  stable position. Interim placement of NG tube, its tip is projected over the stomach. Mediastinum hilar structures are normal. Bibasilar atelectasis and/or infiltrates again noted. Small bilateral pleural effusions cannot be excluded. No pneumothorax.  IMPRESSION: 1. Interim placement of NG tube, its tip is below the left hemidiaphragm. Endotracheal tube in stable position . 2. Stable bibasilar atelectasis and/or infiltrates .   Electronically Signed   By: Marcello Moores  Register   On: 01/21/2015 07:25   Dg Chest Portable 1 View  01/19/2015   CLINICAL DATA:  Overdose, unresponsive  EXAM: PORTABLE CHEST - 1 VIEW  COMPARISON:  11/25/2014  FINDINGS: Endotracheal tube tip is 3.8 cm above the carina. There is minimal curvilinear atelectasis above the slightly elevated right hemidiaphragm. The lungs are otherwise clear. There is no large effusion. Hilar, mediastinal and cardiac contours appear unremarkable.  IMPRESSION: Satisfactory ET tube position. Minimal linear atelectatic appearing opacities in the right base.   Electronically Signed   By: Andreas Newport M.D.   On: 01/19/2015 23:27    CBC  Recent Labs Lab 01/22/15 0240 01/23/15 0245 01/24/15 0538 01/25/15 0628 01/26/15 0734  WBC 9.1 7.7 8.0 7.4 7.5  HGB 13.2 12.7 11.7* 12.1 13.4  HCT 39.8 38.4 35.4* 35.9* 39.3  PLT 237 246 250 308 308  MCV 94.5 95.0 93.4 94.0 93.3  MCH 31.4 31.4 30.9 31.7 31.8  MCHC 33.2 33.1 33.1 33.7 34.1  RDW 12.6 12.5 12.3 12.6 12.6  LYMPHSABS  --  3.1 3.8 3.8 3.6  MONOABS  --  0.6 0.7 0.6 0.8  EOSABS  --  0.4 0.4 0.3 0.4  BASOSABS  --  0.0 0.0 0.0 0.0    Chemistries   Recent Labs Lab 01/21/15 1245  01/22/15 0240 01/23/15 0245 01/23/15 1500 01/24/15 0538 01/25/15 0628 01/26/15 0734  NA 144  --  142 139  --  141 135 138  K 3.7  --  3.1* 2.8* 3.3* 4.3 4.4 4.7  CL 115*  --  105 102  --  104 98* 99*  CO2 19*  --  24 27  --  27 27 28   GLUCOSE 89  --  89 146*  --  92 81 78  BUN <5*  --  <5* 6  --  5* 9 10    CREATININE 0.57  --  0.59 0.89  --  0.71 0.74 0.68  CALCIUM 8.8*  --  9.5 9.3  --  9.4 9.4 9.6  MG  --   < > 1.9 2.1 1.8 2.0 2.0 2.3  AST 23  --   --  18  --   --   --   --   ALT 23  --   --  18  --   --   --   --   ALKPHOS 61  --   --  77  --   --   --   --   BILITOT 0.6  --   --  0.4  --   --   --   --   < > = values in this interval not displayed. ------------------------------------------------------------------------------------------------------------------ estimated creatinine clearance is 72.8 mL/min (by C-G formula based on Cr of 0.68). ------------------------------------------------------------------------------------------------------------------ No results for input(s): HGBA1C in the last 72 hours. ------------------------------------------------------------------------------------------------------------------ No results for input(s): CHOL, HDL, LDLCALC, TRIG, CHOLHDL, LDLDIRECT in the last 72 hours. ------------------------------------------------------------------------------------------------------------------ No results for input(s): TSH, T4TOTAL, T3FREE, THYROIDAB in the last 72 hours.  Invalid input(s): FREET3 ------------------------------------------------------------------------------------------------------------------ No results for input(s): VITAMINB12, FOLATE, FERRITIN, TIBC, IRON, RETICCTPCT in the last 72 hours.  Coagulation profile  Recent Labs Lab 01/21/15 1245 01/23/15 0245  INR 1.09 0.89    No results for input(s): DDIMER in the last 72 hours.  Cardiac Enzymes No results for input(s): CKMB, TROPONINI, MYOGLOBIN in the last 168 hours.  Invalid input(s): CK ------------------------------------------------------------------------------------------------------------------ Invalid input(s): POCBNP    [name] D.O. on 01/28/2015 at 10:47 AM  Between 7am to 7pm - Pager - [phone]  After 7pm go to [email or web address] - password [email or web address]  And  look for the night coverage person covering for me after hours  [facility] Office  [phone]

## 2015-01-29 LAB — BASIC METABOLIC PANEL
ANION GAP: 15 (ref 5–15)
BUN: 15 mg/dL (ref 6–20)
CHLORIDE: 97 mmol/L — AB (ref 101–111)
CO2: 25 mmol/L (ref 22–32)
Calcium: 9.8 mg/dL (ref 8.9–10.3)
Creatinine, Ser: 0.66 mg/dL (ref 0.44–1.00)
GFR calc non Af Amer: >60 mL/min (ref >60)
Glucose, Bld: 81 mg/dL (ref 70–99)
POTASSIUM: 4.5 mmol/L (ref 3.5–5.1)
Sodium: 137 mmol/L (ref 135–145)

## 2015-01-29 LAB — CBC
HEMATOCRIT: 39.9 % (ref 36.0–46.0)
Hemoglobin: 13.2 g/dL (ref 12.0–15.0)
MCH: 31.1 pg (ref 26.0–34.0)
MCHC: 33.1 g/dL (ref 30.0–36.0)
MCV: 94.1 fL (ref 78.0–100.0)
Platelets: 416 10*3/uL — ABNORMAL HIGH (ref 150–400)
RBC: 4.24 MIL/uL (ref 3.87–5.11)
RDW: 12.6 % (ref 11.5–15.5)
WBC: 8.3 10*3/uL (ref 4.0–10.5)

## 2015-01-29 LAB — ACETAMINOPHEN LEVEL: Acetaminophen (Tylenol), Serum: <10 ug/mL — ABNORMAL LOW (ref 10–30)

## 2015-01-29 MED ORDER — BOOST / RESOURCE BREEZE PO LIQD
1.0000 | ORAL | Status: DC
  Administered 2015-01-31 – 2015-02-05 (×4): 1 via ORAL

## 2015-01-29 MED ORDER — ENOXAPARIN SODIUM 40 MG/0.4ML ~~LOC~~ SOLN
40.0000 mg | SUBCUTANEOUS | Status: DC
  Filled 2015-01-29: qty 0.4

## 2015-01-29 NOTE — Clinical Social Work Psych Note (Signed)
Patient currently under review at [facility].  Psych CSW faxed EKG results to [facility] as per request.  Nonnie Done, LCSW [phone]  Psychiatric & Orthopedics (5N 1-8) Clinical Social Worker

## 2015-01-29 NOTE — [facility] Counselor (Signed)
Pt. is being reviewed for possible admission with [facility].

## 2015-01-29 NOTE — Progress Notes (Signed)
Nutrition Follow-up  DOCUMENTATION CODES:  Not applicable  INTERVENTION:  Boost Breezeonce daily  NUTRITION DIAGNOSIS:  Inadequate oral intake related to other (see comment) (depression and social situation) as evidenced by meal completion < 50%.  Discontinued  GOAL:  Patient will meet greater than or equal to 90% of their needs  Being Met  MONITOR:  PO intake, Supplement acceptance  ASSESSMENT: 49 y.o. female who was recently treated with intentional tylenol overdose with significant psychiatric history who was found in a car unresponsive in the [facility] parking lot. Pt extubated 5/3 Pt now on Regular diet.  Pt reports that her appetite has improved and she is eating most of all her meals. Per nursing notes pt is eating 85-100% of meals. Pt states she has been drinking 1 to 2 Boost Breeze supplements daily. She reports occasional nausea which she relates to her anxiety- she has medication prescribed PRN that helps.   Labs reviewed.   Height:  Ht Readings from Last 1 Encounters:  12/11/14 $RemoveB'5\' 1"'IIPRAlWD$  (1.549 m)    Weight:  Wt Readings from Last 1 Encounters:  01/29/15 138 lb 4 oz (62.71 kg)   01/22/15 134 lb 11.2 oz (61.1 kg)        Ideal Body Weight:  47.7 kg  Wt Readings from Last 10 Encounters:  01/29/15 138 lb 4 oz (62.71 kg)  12/15/14 137 lb 5.6 oz (62.3 kg)  10/27/14 121 lb 0.5 oz (54.9 kg)  10/09/14 118 lb (53.524 kg)  10/01/14 125 lb 4.8 oz (56.836 kg)  07/15/14 139 lb 11.2 oz (63.368 kg)  03/20/14 136 lb 3.2 oz (61.78 kg)  11/04/13 130 lb (58.968 kg)  05/18/12 151 lb (68.493 kg)    BMI:  Body mass index is 26.14 kg/(m^2).  Estimated Nutritional Needs:  Kcal:  1500-1700  Protein:  75-85 grams  Fluid:  > 1.5 L/day  Skin:  Reviewed, no issues  Diet Order:  Diet regular Room service appropriate?: Yes; Fluid consistency:: Thin  EDUCATION NEEDS:  No education needs identified at this time   Intake/Output Summary (Last 24 hours) at 01/29/15  1351 Last data filed at 01/28/15 1900  Gross per 24 hour  Intake    360 ml  Output      0 ml  Net    360 ml    Last BM:  5/9  Pryor Ochoa RD, LDN Inpatient Clinical Dietitian Pager: [phone] After Hours Pager: [phone]

## 2015-01-29 NOTE — Progress Notes (Signed)
[facility]                                                                              Patient Demographics  Cyra Spader, is a 49 y.o. female, DOB - 1966/03/31, KNL:[id number]   HPI on 01/20/2015 by Dr. Montey Hora and Dr. Kara Mead Srinidhi Landers is a 49 y.o. F with PMH as outlined below. She was brought to [facility] ED 5/1 by EMS after a bystander saw her slumped over in her car in a nearby parking lot. [facility] first responded and found her unresponsive with ? Convulsions. [facility] found a brown paper bag with aluminum foil and a spoon in her car. No street drugs were found. She was brought to ED where she remained unresponsive to sternal rub or ammonia inhalants. She was intubated for airway protection and PCCM was called for admission.  Of note, she has hx of admission for intentional tylenol overdose as part of suicide attempt. Last admission here for this was in 2015 where tylenol level was 492. On this admission, tylenol level was 140. UDS is pending.  She was seen in consultation by neurology. She apparently was just discharged from [place] facility earlier this month and while there, she had a an EEG performed for concern of seizure vs pseudoseizures. EEG captured on of her typical events and demonstrated that it was not epileptic in nature. CT head was negative and neuro recommended obtaining ammonia and depakote levels.  Interim history Psychiatry consulted, pending placement for inpatient psych.  Pateint has had multiple admissions to psych units.    Assessment & Plan   Suicide attempt -Continue patient on suicide precaution -Patient is medically stable -Pending inpatient psych.  Per SW note: patient would be a good candidate for care coordination due to 8 admissions in 6 months.   -medications per psych. Waiting placement  Acetaminophen overdose - with hx of intentional OD as part of suicide attempt -Multi medication overdose to include Tylenol, amitriptyline,  and Soma.  -Patient's liver enzymes within normal limits currently  -Patient received N-acetylcysteine with loading dose following by maintenance infusion.Pharmacy to monitor NAC. -Acetaminophen level<10 on 5/3. Repeat level today as requested by outside facility.   Acute respiratory failure following acetaminophen/amitriptyline/soma overdose -Resolved  Respiratory acidosis -Resolved  Tobacco use disorder -Tobacco cessation counseling.  Prolonged QTc on admission  -Most likely secondary to amitriptyline overdose, resolved on subsequent EKG  Hypokalemia -Resolved, continue to monitor BMP and replace as needed   Hx psychiatric pseudoseizure, ETOH abuse, depression, anxiety -UDS positive for opiates and benzodiazepines which were expected. -Psychiatry consulted and recommended Fluoxetine 10 mg daily, gabapentin 300mg  TID for anxiety, and discontinued benzodiazepines  Bipolar disorder  -Per psychiatry -Per patient, she does not have a history of bipolar disorder  Code Status: Full  Family Communication: None at bedside.  Disposition Plan: Admitted.  Pending inpatient psychiatry placement.  Patient medically stable for discharge.  Spoke with [facility], however, due to lack of insurance, patient not accepted.  Currently on wait list.   Time Spent in minutes   20 minutes  Procedures/Significant  Events CXR 5/1 >>> no acute process CT head 5/1 >>> no acute process 5/1 -  brought to ED after being found unresponsive. Admitted with tylenol overdose. 01/21/15: Extubated by eMD  Consults   Dr. Ambrose Finland (psychiatry)  DVT Prophylaxis  Patient ambulatory   Lab Results  Component Value Date   PLT 416* 01/29/2015    Medications  Scheduled Meds: . diphenhydrAMINE  25 mg Oral Once  . feeding supplement (RESOURCE BREEZE)  1 Container Oral Q24H  . FLUoxetine  10 mg Oral Daily  . folic acid  1 mg Oral Daily  . gabapentin  300 mg Oral TID  . pantoprazole  40 mg  Oral Q1200  . polyethylene glycol  17 g Oral Daily  . thiamine  100 mg Oral Daily   Continuous Infusions:  PRN Meds:.benzocaine, butalbital-acetaminophen-caffeine, ondansetron, oxyCODONE, phenol  Antibiotics    Anti-infectives    None      Subjective:   Denies dyspnea or chest pain.  Relates tooth pain.   Objective:   Filed Vitals:   01/29/15 0436 01/29/15 0614 01/29/15 1004 01/29/15 1357  BP:  98/60 127/93 108/71  Pulse:  80 87 84  Temp:  98.1 F (36.7 C) 98.8 F (37.1 C) 98.8 F (37.1 C)  TempSrc:  Oral Oral Oral  Resp:  18 18 18   Weight: 62.71 kg (138 lb 4 oz)     SpO2:  98% 99% 98%    Wt Readings from Last 3 Encounters:  01/29/15 62.71 kg (138 lb 4 oz)  12/15/14 62.3 kg (137 lb 5.6 oz)  10/27/14 54.9 kg (121 lb 0.5 oz)     Intake/Output Summary (Last 24 hours) at 01/29/15 1516 Last data filed at 01/29/15 1210  Gross per 24 hour  Intake    960 ml  Output      0 ml  Net    960 ml    Exam  General: Well developed, well nourished, no distress  HEENT: NCAT, mucous membranes moist.  Cardiovascular: S1 S2 auscultated, no murmurs, RRR  Respiratory: Clear to auscultation  Abdomen: Soft, nontender, nondistended, + bowel sounds  Neuro: AAOx3, nonfocal   Data Review   Micro Results Recent Results (from the past 240 hour(s))  MRSA PCR Screening     Status: None   Collection Time: 01/20/15  1:29 AM  Result Value Ref Range Status   MRSA by PCR NEGATIVE NEGATIVE Final    Comment:        The GeneXpert MRSA Assay (FDA approved for NASAL specimens only), is one component of a comprehensive MRSA colonization surveillance program. It is not intended to diagnose MRSA infection nor to guide or monitor treatment for MRSA infections.     Radiology Reports Ct Head Wo Contrast  01/19/2015   CLINICAL DATA:  Found in car unresponsive  EXAM: CT HEAD WITHOUT CONTRAST  TECHNIQUE: Contiguous axial images were obtained from the base of the skull through the  vertex without intravenous contrast.  COMPARISON:  12/10/2014  FINDINGS: There is no intracranial hemorrhage, mass or evidence of acute infarction. Gray matter and white matter are normal. The ventricles and basal cisterns appear unremarkable.  The bony structures are intact. The visible portions of the paranasal sinuses are clear.  IMPRESSION: Normal brain   Electronically Signed   By: Andreas Newport M.D.   On: 01/19/2015 23:40   Dg Chest Port 1 View  01/22/2015   CLINICAL DATA:  Common a overdose, respiratory failure  EXAM: PORTABLE CHEST - 1 VIEW  COMPARISON:  Portable chest x-ray of Jan 21, 2015  FINDINGS: There is been  interval extubation of the trachea and esophagus. The lungs are reasonably well inflated. There is minimal subsegmental atelectasis at the bases. There is no pleural effusion. The cardiac silhouette is top-normal in size. The pulmonary vascularity is not engorged. The bony thorax is unremarkable.  IMPRESSION: Interval extubation of the trachea and esophagus. Minimal persistent bibasilar subsegmental atelectasis.   Electronically Signed   By: David  Martinique M.D.   On: 01/22/2015 07:36   Portable Chest Xray  01/21/2015   CLINICAL DATA:  Respiratory failure .  EXAM: PORTABLE CHEST - 1 VIEW  COMPARISON:  01/19/2015.  FINDINGS: Endotracheal tube in stable position. Interim placement of NG tube, its tip is projected over the stomach. Mediastinum hilar structures are normal. Bibasilar atelectasis and/or infiltrates again noted. Small bilateral pleural effusions cannot be excluded. No pneumothorax.  IMPRESSION: 1. Interim placement of NG tube, its tip is below the left hemidiaphragm. Endotracheal tube in stable position . 2. Stable bibasilar atelectasis and/or infiltrates .   Electronically Signed   By: Marcello Moores  Register   On: 01/21/2015 07:25   Dg Chest Portable 1 View  01/19/2015   CLINICAL DATA:  Overdose, unresponsive  EXAM: PORTABLE CHEST - 1 VIEW  COMPARISON:  11/25/2014  FINDINGS:  Endotracheal tube tip is 3.8 cm above the carina. There is minimal curvilinear atelectasis above the slightly elevated right hemidiaphragm. The lungs are otherwise clear. There is no large effusion. Hilar, mediastinal and cardiac contours appear unremarkable.  IMPRESSION: Satisfactory ET tube position. Minimal linear atelectatic appearing opacities in the right base.   Electronically Signed   By: Andreas Newport M.D.   On: 01/19/2015 23:27    CBC  Recent Labs Lab 01/23/15 0245 01/24/15 0538 01/25/15 0628 01/26/15 0734 01/29/15 0427  WBC 7.7 8.0 7.4 7.5 8.3  HGB 12.7 11.7* 12.1 13.4 13.2  HCT 38.4 35.4* 35.9* 39.3 39.9  PLT 246 250 308 308 416*  MCV 95.0 93.4 94.0 93.3 94.1  MCH 31.4 30.9 31.7 31.8 31.1  MCHC 33.1 33.1 33.7 34.1 33.1  RDW 12.5 12.3 12.6 12.6 12.6  LYMPHSABS 3.1 3.8 3.8 3.6  --   MONOABS 0.6 0.7 0.6 0.8  --   EOSABS 0.4 0.4 0.3 0.4  --   BASOSABS 0.0 0.0 0.0 0.0  --     Chemistries   Recent Labs Lab 01/23/15 0245 01/23/15 1500 01/24/15 0538 01/25/15 0628 01/26/15 0734 01/29/15 0427  NA 139  --  141 135 138 137  K 2.8* 3.3* 4.3 4.4 4.7 4.5  CL 102  --  104 98* 99* 97*  CO2 27  --  27 27 28 25   GLUCOSE 146*  --  92 81 78 81  BUN 6  --  5* 9 10 15   CREATININE 0.89  --  0.71 0.74 0.68 0.66  CALCIUM 9.3  --  9.4 9.4 9.6 9.8  MG 2.1 1.8 2.0 2.0 2.3  --   AST 18  --   --   --   --   --   ALT 18  --   --   --   --   --   ALKPHOS 77  --   --   --   --   --   BILITOT 0.4  --   --   --   --   --    ------------------------------------------------------------------------------------------------------------------ estimated creatinine clearance is 73 mL/min (by C-G formula based on Cr of 0.66). ------------------------------------------------------------------------------------------------------------------ No results for input(s): HGBA1C in the last 72  hours. ------------------------------------------------------------------------------------------------------------------  No results for input(s): CHOL, HDL, LDLCALC, TRIG, CHOLHDL, LDLDIRECT in the last 72 hours. ------------------------------------------------------------------------------------------------------------------ No results for input(s): TSH, T4TOTAL, T3FREE, THYROIDAB in the last 72 hours.  Invalid input(s): FREET3 ------------------------------------------------------------------------------------------------------------------ No results for input(s): VITAMINB12, FOLATE, FERRITIN, TIBC, IRON, RETICCTPCT in the last 72 hours.  Coagulation profile  Recent Labs Lab 01/23/15 0245  INR 0.89    No results for input(s): DDIMER in the last 72 hours.  Cardiac Enzymes No results for input(s): CKMB, TROPONINI, MYOGLOBIN in the last 168 hours.  Invalid input(s): CK ------------------------------------------------------------------------------------------------------------------ Invalid input(s): POCBNP    Bernadette Armijo A D.O. on 01/29/2015 at 3:16 PM [phone].   After 7pm go to [email or web address] - password [email or web address]  And look for the night coverage person covering for me after hours  [facility] Office  [phone]

## 2015-01-30 MED ORDER — FLUOXETINE HCL 20 MG PO CAPS
20.0000 mg | ORAL_CAPSULE | Freq: Every day | ORAL | Status: DC
  Administered 2015-01-31 – 2015-02-06 (×7): 20 mg via ORAL
  Filled 2015-01-30 (×7): qty 1

## 2015-01-30 NOTE — Progress Notes (Signed)
[facility]                                                                              Patient Demographics  [name], is a 49 y.o. female, DOB - [birth year], GYI:[id number]   HPI on 01/20/2015 by Dr. Montey Hora and Dr. Kara Mead [name] is a 49 y.o. F with PMH as outlined below. She was brought to [facility] ED 5/1 by EMS after a bystander saw her slumped over in her car in a nearby parking lot. [facility] first responded and found her unresponsive with ? Convulsions. [facility] found a brown paper bag with aluminum foil and a spoon in her car. No street drugs were found. She was brought to ED where she remained unresponsive to sternal rub or ammonia inhalants. She was intubated for airway protection and PCCM was called for admission.  Of note, she has hx of admission for intentional tylenol overdose as part of suicide attempt. Last admission here for this was in 2015 where tylenol level was 492. On this admission, tylenol level was 140. UDS is pending.  She was seen in consultation by neurology. She apparently was just discharged from [place] facility earlier this month and while there, she had a an EEG performed for concern of seizure vs pseudoseizures. EEG captured on of her typical events and demonstrated that it was not epileptic in nature. CT head was negative and neuro recommended obtaining ammonia and depakote levels.  Interim history Psychiatry consulted, pending placement for inpatient psych.  Pateint has had multiple admissions to psych units.    Assessment & Plan   Suicide attempt -Continue patient on suicide precaution -Patient is medically stable -Pending inpatient psych.  Per SW note: patient would be a good candidate for care coordination due to 8 admissions in 6 months.   -medications per psych. Waiting placement  Acetaminophen overdose - with hx of intentional OD as part of suicide attempt -Multi medication overdose to include Tylenol, amitriptyline,  and Soma.  -Patient's liver enzymes within normal limits currently  -Patient received N-acetylcysteine with loading dose following by maintenance infusion.Pharmacy to monitor NAC. -Acetaminophen level<10 on 5/3. Repeat level today as requested by outside facility. Tylenol level less than 10 on 5-11  Acute respiratory failure following acetaminophen/amitriptyline/soma overdose -Resolved  Respiratory acidosis -Resolved  Tobacco use disorder -Tobacco cessation counseling.  Prolonged QTc on admission  -Most likely secondary to amitriptyline overdose, resolved on subsequent EKG  Hypokalemia -Resolved, continue to monitor BMP and replace as needed   Hx psychiatric pseudoseizure, ETOH abuse, depression, anxiety -UDS positive for opiates and benzodiazepines which were expected. -Psychiatry consulted and recommended Fluoxetine 10 mg daily, gabapentin 300mg  TID for anxiety, and discontinued benzodiazepines  Bipolar disorder  -Per psychiatry -Per patient, she does not have a history of bipolar disorder  Code Status: Full  Family Communication: None at bedside.  Disposition Plan: Admitted.  Pending inpatient psychiatry placement.   Currently on wait list.   Time Spent in minutes   20 minutes  Procedures/Significant  Events CXR 5/1 >>> no acute process CT head 5/1 >>> no acute process 5/1 - brought to ED after being found unresponsive. Admitted with tylenol overdose. 01/21/15:  Extubated by eMD  Consults   Dr. Ambrose Finland (psychiatry)  DVT Prophylaxis  Patient ambulatory   Lab Results  Component Value Date   PLT 416* 01/29/2015    Medications  Scheduled Meds: . diphenhydrAMINE  25 mg Oral Once  . enoxaparin (LOVENOX) injection  40 mg Subcutaneous Q24H  . feeding supplement (RESOURCE BREEZE)  1 Container Oral Q24H  . FLUoxetine  10 mg Oral Daily  . folic acid  1 mg Oral Daily  . gabapentin  300 mg Oral TID  . pantoprazole  40 mg Oral Q1200  .  polyethylene glycol  17 g Oral Daily  . thiamine  100 mg Oral Daily   Continuous Infusions:  PRN Meds:.benzocaine, butalbital-acetaminophen-caffeine, ondansetron, oxyCODONE, phenol  Antibiotics    Anti-infectives    None      Subjective:  No chest pain, no dyspnea. Eating ok and no problems with BM   Objective:   Filed Vitals:   01/30/15 0548 01/30/15 0700 01/30/15 1041 01/30/15 1433  BP:  131/90 128/93 128/92  Pulse:  63 98 91  Temp:  97.5 F (36.4 C) 98.1 F (36.7 C) 98.6 F (37 C)  TempSrc:  Oral Oral Oral  Resp:  16 16 16   Weight: 67.274 kg (148 lb 5 oz)     SpO2:  98% 97% 99%    Wt Readings from Last 3 Encounters:  01/30/15 67.274 kg (148 lb 5 oz)  12/15/14 62.3 kg (137 lb 5.6 oz)  10/27/14 54.9 kg (121 lb 0.5 oz)     Intake/Output Summary (Last 24 hours) at 01/30/15 1456 Last data filed at 01/30/15 0800  Gross per 24 hour  Intake    240 ml  Output      0 ml  Net    240 ml    Exam  General:  no distress  HEENT: NCAT, mucous membranes moist.  Cardiovascular: S1 S2 auscultated, no murmurs, RRR  Respiratory: Clear to auscultation  Abdomen: Soft, nontender, nondistended, + bowel sounds    Data Review   Micro Results No results found for this or any previous visit (from the past 240 hour(s)).  Radiology Reports Ct Head Wo Contrast  01/19/2015   CLINICAL DATA:  Found in car unresponsive  EXAM: CT HEAD WITHOUT CONTRAST  TECHNIQUE: Contiguous axial images were obtained from the base of the skull through the vertex without intravenous contrast.  COMPARISON:  12/10/2014  FINDINGS: There is no intracranial hemorrhage, mass or evidence of acute infarction. Gray matter and white matter are normal. The ventricles and basal cisterns appear unremarkable.  The bony structures are intact. The visible portions of the paranasal sinuses are clear.  IMPRESSION: Normal brain   Electronically Signed   By: Andreas Newport M.D.   On: 01/19/2015 23:40   Dg Chest Port  1 View  01/22/2015   CLINICAL DATA:  Common a overdose, respiratory failure  EXAM: PORTABLE CHEST - 1 VIEW  COMPARISON:  Portable chest x-ray of Jan 21, 2015  FINDINGS: There is been interval extubation of the trachea and esophagus. The lungs are reasonably well inflated. There is minimal subsegmental atelectasis at the bases. There is no pleural effusion. The cardiac silhouette is top-normal in size. The pulmonary vascularity is not engorged. The bony thorax is unremarkable.  IMPRESSION: Interval extubation of the trachea and esophagus. Minimal persistent bibasilar subsegmental atelectasis.   Electronically Signed   By: David  Martinique M.D.   On: 01/22/2015 07:36   Portable Chest Xray  01/21/2015  CLINICAL DATA:  Respiratory failure .  EXAM: PORTABLE CHEST - 1 VIEW  COMPARISON:  01/19/2015.  FINDINGS: Endotracheal tube in stable position. Interim placement of NG tube, its tip is projected over the stomach. Mediastinum hilar structures are normal. Bibasilar atelectasis and/or infiltrates again noted. Small bilateral pleural effusions cannot be excluded. No pneumothorax.  IMPRESSION: 1. Interim placement of NG tube, its tip is below the left hemidiaphragm. Endotracheal tube in stable position . 2. Stable bibasilar atelectasis and/or infiltrates .   Electronically Signed   By: Marcello Moores  Register   On: 01/21/2015 07:25   Dg Chest Portable 1 View  01/19/2015   CLINICAL DATA:  Overdose, unresponsive  EXAM: PORTABLE CHEST - 1 VIEW  COMPARISON:  11/25/2014  FINDINGS: Endotracheal tube tip is 3.8 cm above the carina. There is minimal curvilinear atelectasis above the slightly elevated right hemidiaphragm. The lungs are otherwise clear. There is no large effusion. Hilar, mediastinal and cardiac contours appear unremarkable.  IMPRESSION: Satisfactory ET tube position. Minimal linear atelectatic appearing opacities in the right base.   Electronically Signed   By: Andreas Newport M.D.   On: 01/19/2015 23:27     CBC  Recent Labs Lab 01/24/15 0538 01/25/15 0628 01/26/15 0734 01/29/15 0427  WBC 8.0 7.4 7.5 8.3  HGB 11.7* 12.1 13.4 13.2  HCT 35.4* 35.9* 39.3 39.9  PLT 250 308 308 416*  MCV 93.4 94.0 93.3 94.1  MCH 30.9 31.7 31.8 31.1  MCHC 33.1 33.7 34.1 33.1  RDW 12.3 12.6 12.6 12.6  LYMPHSABS 3.8 3.8 3.6  --   MONOABS 0.7 0.6 0.8  --   EOSABS 0.4 0.3 0.4  --   BASOSABS 0.0 0.0 0.0  --     Chemistries   Recent Labs Lab 01/23/15 1500 01/24/15 0538 01/25/15 0628 01/26/15 0734 01/29/15 0427  NA  --  141 135 138 137  K 3.3* 4.3 4.4 4.7 4.5  CL  --  104 98* 99* 97*  CO2  --  27 27 28 25   GLUCOSE  --  92 81 78 81  BUN  --  5* 9 10 15   CREATININE  --  0.71 0.74 0.68 0.66  CALCIUM  --  9.4 9.4 9.6 9.8  MG 1.8 2.0 2.0 2.3  --    ------------------------------------------------------------------------------------------------------------------ estimated creatinine clearance is 75.5 mL/min (by C-G formula based on Cr of 0.66). ------------------------------------------------------------------------------------------------------------------ No results for input(s): HGBA1C in the last 72 hours. ------------------------------------------------------------------------------------------------------------------ No results for input(s): CHOL, HDL, LDLCALC, TRIG, CHOLHDL, LDLDIRECT in the last 72 hours. ------------------------------------------------------------------------------------------------------------------ No results for input(s): TSH, T4TOTAL, T3FREE, THYROIDAB in the last 72 hours.  Invalid input(s): FREET3 ------------------------------------------------------------------------------------------------------------------ No results for input(s): VITAMINB12, FOLATE, FERRITIN, TIBC, IRON, RETICCTPCT in the last 72 hours.  Coagulation profile No results for input(s): INR, PROTIME in the last 168 hours.  No results for input(s): DDIMER in the last 72 hours.  Cardiac  Enzymes No results for input(s): CKMB, TROPONINI, MYOGLOBIN in the last 168 hours.  Invalid input(s): CK ------------------------------------------------------------------------------------------------------------------ Invalid input(s): POCBNP    Gevork Ayyad A MD. on 01/30/2015 at 2:56 PM [phone].   After 7pm go to [email or web address] - password [email or web address]  And look for the night coverage person covering for me after hours  [facility] Office  [phone]

## 2015-01-30 NOTE — Clinical Social Work Psych Note (Addendum)
Psych CSW confirmed patient remains on waitlist. Bed not available at this time.    Psych CSW received updated [place] [id number] effective 5/12-5/18.  [place] updated.  Nonnie Done, LCSW [phone]  Psychiatric & Orthopedics (5N 1-8) Clinical Social Worker

## 2015-01-30 NOTE — Consult Note (Signed)
Psychiatry Consult Follow up  Reason for Consult:  Depression, suicide attempt by overdose Referring Physician:  Dr. Ree Kida Patient Identification: [name] MRN:  [id number] Principal Diagnosis: Major depressive disorder-recurrent severe, without psychosis Diagnosis:   Patient Active Problem List   Diagnosis Date Noted  . Suicide attempt [T14.91]   . Amitriptyline overdose [T43.011A]   . Respiratory acidosis [E87.2]   . Prolonged Q-T interval on ECG [I45.81]   . Alcohol abuse [F10.10]   . Depression [F32.9]   . Anxiety state [F41.1]   . Bipolar I disorder, most recent episode (or current) unspecified [F31.9]   . Respiratory failure [J96.90]   . Seizure-like activity [R56.9] 12/11/2014  . Suicide attempt by hanging [T71.162A]   . MDD (major depressive disorder), recurrent severe, without psychosis [F33.2] 11/29/2014  . Panic disorder [F41.0] 11/29/2014  . Agoraphobia [F40.00] 11/29/2014  . ARF (acute renal failure) [N17.9] 11/25/2014  . Intentional acetaminophen overdose [T39.1X2A] 10/26/2014  . Major depressive disorder, recurrent, severe without psychotic features [F33.2]   . Insomnia [G47.00]   . Chronic pain syndrome [G89.4]   . Bipolar 1 disorder, mixed, severe [F31.63] 10/09/2014  . Acute blood loss anemia [D62] 10/07/2014  . Liver failure, acute [K72.00]   . GI bleed [K92.2] 10/06/2014  . Duodenal ulcer hemorrhage [K26.4] 10/06/2014  . Vomiting [R11.10] 10/05/2014  . Acute hepatitis [K72.00] 10/05/2014  . Acute renal insufficiency [N28.9] 10/05/2014  . Seizure disorder [G40.909] 10/05/2014  . Hyperammonemia [E72.20] 10/05/2014  . Suicidal ideation [R45.851] 10/05/2014  . Bipolar disorder [F31.9] 10/01/2014  . Major depression [F32.2] 10/01/2014  . Nausea and vomiting [R11.2] 09/30/2014  . Diarrhea [R19.7] 09/30/2014  . SIRS (systemic inflammatory response syndrome) [A41.9] 07/15/2014  . Tylenol overdose [T39.1X4A] 07/13/2014  . Hypokalemia [E87.6] 07/13/2014     Total Time spent with patient: 20 minutes  Subjective:   [name] is a 49 y.o. female patient admitted with tylenol overdose and AMS.  HPI:  [name] is a 49 y.o. Female admitted to [facility] after intentional overdose of Tylenol nearby parking lot at [facility] emergency department. Patient chart reviewed including neurology consultation and recommendations. Patient's serum is acetaminophen level is 140 on admission. Patient was found in her bed with the dysphoric affect and has a Air cabin crew at bedside. Patient reported she has been depressed, sad, tired, hopeless, helpless, worthless, overwhelmed and tried to end her life by taking Tylenol overdose. Patient has previous history of Tylenol overdose and at one point considered she needed liver transplant. She received ECT treatment during her hospitalization at [facility] in February 2016. Patient is known to this provider from her multiple hospitalizations both at [facility], [facility], [facility]. During the last hospitalization patient was referred to [facility] where she was treated and discharged home with her sister about 2 weeks ago. Patient reportedly came back to her condo apartment and then felt again depressed and becomes suicidal. Patient cannot contract for safety at this time. Patient has a history of seizure, pseudoseizures and received antiepileptic medication Keppra in the past. Patient has no family history of mental illness. Patient has no substance abuse history. Patient has 2 sisters who were tired off patient's mental and medical problems and constantly needed care. Patient has no family or children. Patient was not able to work over the years and struggling for financially. 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 Patient reported her  recent hospitalization at [facility] in [place], stated she was misdiagnosed and then discontinued some of her psychiatric medication but she does not remember what medications she was discharged to community. Will ask psychiatric social service to contact [facility] regarding discharge medication if possible  I05/08/2015 Interval History:  Patient has no new complaints and at the same time reported she has no changes in her clinical situation regarding depression, anxiety and suicidal ideation. Patient reportedly spoke with the psychiatric social service and requested long-term psychiatric hospitalization. Patient continued to endorse suicidal ideation and cannot contract for safety. Patient has significant history of multiple acute psychiatric hospitalization secondary to multiple suicidal attempts with the Tylenol overdose.  Patient complains scared, and unhappy and no changes in her psychosocial situation and asking the psychiatric social service regarding social disability benefits. Patient stated she she felt forced during the last visit when asked about and she was feeling safe enough to be discharged to go home. Patient has no appetite suicidal behaviors or gestures. Patient is calm and cooperative with the staff members including Air cabin crew. Patient safety sitter is at bedside.   She stepped down to the Neuroscience unit from intensive care unit. She continues to report being overwhelmed and stressed out with life.Patient is endorses recurrent suicidal thoughts, hopelessness, helplessness, worthlessness, lack of motivation, anhedonia and difficulty sleeping. Patient does not want to be pushed out of the hospital because of multiple suicidal attempts since 11/25/05 and reportedly she does not ask for help does not talk to anybody before she makes the suicidal  attempts.  She reports long history of alcohol dependence until 11-26-10 when she quit after a lot of help. Patient was first diagnosed with clinical  depression in November 25, 1992 after her father died. She has had multiple suicide attempts since November 25, 2005 and as a result has been hospitalized in various psychiatric inpatient hospitals. Patient denies history of manic episode, delusions or psychosis. She continues to verbalize suicidal thoughts with no specific plan. She felt Prozac and ECT received at [facility] was helpful but not all other medications. She agreed to inpatient admission.  Past Medical History:  Past Medical History  Diagnosis Date  . Migraines   . Endometriosis   . ETOH abuse     sober 3 1/2 years  . Anorexia   . Depression   . Anxiety   . DJD (degenerative joint disease) of cervical spine   . Psychiatric pseudoseizure   . PICC (peripherally inserted central catheter) in place     rt neck  . Seizures     Past Surgical History  Procedure Laterality Date  . Knee surgery    . Abdominal surgery    . Cholecystectomy    . Appendectomy    . Abdominal hysterectomy    . Nasal sinus surgery    . Laproscopy    . Carpal tunnel release      11-25-2008  . Esophagogastroduodenoscopy N/A 10/06/2014    Procedure: ESOPHAGOGASTRODUODENOSCOPY (EGD);  Surgeon: Inda Castle, MD;  Location: [facility];  Service: Endoscopy;  Laterality: N/A;   Family History:  Family History  Problem Relation Age of Onset  . Hypertension Mother   . Hyperlipidemia Mother   . Heart failure Father   . Cancer Other    Social History:  History  Alcohol Use No     History  Drug Use No    History   Social History  . Marital Status: Single  Spouse Name: N/A  . Number of Children: N/A  . Years of Education: N/A   Social History Main Topics  . Smoking status: Current Every Day Smoker -- 0.25 packs/day for 20 years    Types: Cigarettes  . Smokeless tobacco: Never Used  . Alcohol Use: No  . Drug Use: No  . Sexual Activity: Not on file   Other Topics Concern  . None   Social History Narrative   Additional Social History: Patient  lives by herself in a condo and has 2 sisters and her relationships were strained. She has no history of substance abuse.                          Allergies:   Allergies  Allergen Reactions  . Aspirin Anaphylaxis  . Doxycycline Anaphylaxis and Other (See Comments)    Joint damage also  . Imitrex [Sumatriptan Base] Other (See Comments)    Severe hypertension  . Iodinated Diagnostic Agents Anaphylaxis  . Lidocaine Anaphylaxis    Pt states she does well with Marcaine/Bupivicaine without problem  . Prednisone Other (See Comments)    She goes crazy  . Sulfa Antibiotics Anaphylaxis  . Sulfasalazine Anaphylaxis  . Sumatriptan Other (See Comments)    Felt like she was "having a stroke", heart rate and blood pressure "went through the roof"  . Tramadol Other (See Comments)    seizures  . Levofloxacin Other (See Comments)    Joints hurt  . Latex Rash  . Levofloxacin Rash and Other (See Comments)    Joints hurt  . Metrizamide Other (See Comments)  . Nsaids Rash and Other (See Comments)    GI bleed  . Tolmetin Rash    Labs:  Results for orders placed or performed during the hospital encounter of 01/19/15 (from the past 48 hour(s))  CBC     Status: Abnormal   Collection Time: 01/29/15  4:27 AM  Result Value Ref Range   WBC 8.3 4.0 - 10.5 K/uL   RBC 4.24 3.87 - 5.11 MIL/uL   Hemoglobin 13.2 12.0 - 15.0 g/dL   HCT 39.9 36.0 - 46.0 %   MCV 94.1 78.0 - 100.0 fL   MCH 31.1 26.0 - 34.0 pg   MCHC 33.1 30.0 - 36.0 g/dL   RDW 12.6 11.5 - 15.5 %   Platelets 416 (H) 150 - 400 K/uL  Basic metabolic panel     Status: Abnormal   Collection Time: 01/29/15  4:27 AM  Result Value Ref Range   Sodium 137 135 - 145 mmol/L   Potassium 4.5 3.5 - 5.1 mmol/L   Chloride 97 (L) 101 - 111 mmol/L   CO2 25 22 - 32 mmol/L   Glucose, Bld 81 70 - 99 mg/dL   BUN 15 6 - 20 mg/dL   Creatinine, Ser 0.66 0.44 - 1.00 mg/dL   Calcium 9.8 8.9 - 10.3 mg/dL   GFR calc non Af Amer >60 >60 mL/min   GFR  calc Af Amer >60 >60 mL/min    Comment: (NOTE) The eGFR has been calculated using the CKD EPI equation. This calculation has not been validated in all clinical situations. eGFR's persistently <60 mL/min signify possible Chronic Kidney Disease.    Anion gap 15 5 - 15  Acetaminophen level     Status: Abnormal   Collection Time: 01/29/15  1:09 PM  Result Value Ref Range   Acetaminophen (Tylenol), Serum <10 (L) 10 - 30 ug/mL  Comment:        THERAPEUTIC CONCENTRATIONS VARY SIGNIFICANTLY. A RANGE OF 10-30 ug/mL MAY BE AN EFFECTIVE CONCENTRATION FOR MANY PATIENTS. HOWEVER, SOME ARE BEST TREATED AT CONCENTRATIONS OUTSIDE THIS RANGE. ACETAMINOPHEN CONCENTRATIONS >150 ug/mL AT 4 HOURS AFTER INGESTION AND >50 ug/mL AT 12 HOURS AFTER INGESTION ARE OFTEN ASSOCIATED WITH TOXIC REACTIONS.     Vitals: Blood pressure 128/93, pulse 98, temperature 98.1 F (36.7 C), temperature source Oral, resp. rate 16, weight 67.274 kg (148 lb 5 oz), SpO2 97 %.  Risk to Self: Is patient at risk for suicide?: Yes Risk to Others:   Prior Inpatient Therapy:   Prior Outpatient Therapy:    Current Facility-Administered Medications  Medication Dose Route Frequency Provider Last Rate Last Dose  . benzocaine (ORAJEL) 10 % mucosal gel   Mouth/Throat QID PRN Cristal Ford, DO      . butalbital-acetaminophen-caffeine (FIORICET, ESGIC) [phone] MG per tablet 1 tablet  1 tablet Oral Q6H PRN Cristal Ford, DO   1 tablet at 01/29/15 1245  . diphenhydrAMINE (BENADRYL) capsule 25 mg  25 mg Oral Once Dianne Dun, NP   25 mg at 01/26/15 2200  . enoxaparin (LOVENOX) injection 40 mg  40 mg Subcutaneous Q24H Belkys A Regalado, MD   40 mg at 01/29/15 1530  . feeding supplement (RESOURCE BREEZE) (RESOURCE BREEZE) liquid 1 Container  1 Container Oral Q24H Baird Lyons, RD   1 Container at 01/29/15 2244  . FLUoxetine (PROZAC) capsule 10 mg  10 mg Oral Daily Ambrose Finland, MD   10 mg at 01/30/15  0937  . folic acid (FOLVITE) tablet 1 mg  1 mg Oral Daily Donalynn Furlong [place], RPH   1 mg at 01/30/15 8527  . gabapentin (NEURONTIN) capsule 300 mg  300 mg Oral TID Ambrose Finland, MD   300 mg at 01/30/15 0937  . ondansetron (ZOFRAN-ODT) disintegrating tablet 8 mg  8 mg Oral Q8H PRN Maryann Mikhail, DO   8 mg at 01/30/15 7824  . oxyCODONE (Oxy IR/ROXICODONE) immediate release tablet 5 mg  5 mg Oral Q4H PRN Wilhelmina Mcardle, MD   5 mg at 01/30/15 0945  . pantoprazole (PROTONIX) EC tablet 40 mg  40 mg Oral Q1200 Brand Males, MD   40 mg at 01/29/15 1211  . phenol (CHLORASEPTIC) mouth spray 1 spray  1 spray Mouth/Throat PRN Raylene Miyamoto, MD   1 spray at 01/22/15 2140  . polyethylene glycol (MIRALAX / GLYCOLAX) packet 17 g  17 g Oral Daily Maryann Mikhail, DO   17 g at 01/30/15 2353  . thiamine (VITAMIN B-1) tablet 100 mg  100 mg Oral Daily Donalynn Furlong [place], RPH   100 mg at 01/30/15 6144    Musculoskeletal: Strength & Muscle Tone: decreased Gait & Station: unable to stand Patient leans: N/A  Psychiatric Specialty Exam: Physical Exam:   ROS:   Blood pressure 128/93, pulse 98, temperature 98.1 F (36.7 C), temperature source Oral, resp. rate 16, weight 67.274 kg (148 lb 5 oz), SpO2 97 %.Body mass index is 28.04 kg/(m^2).  General Appearance: Casual and dressed in hospital gown  Eye Contact::  Fair  Speech:  Clear and Coherent and Slow  Volume:  Decreased  Mood:  Depressed, Hopeless and Worthless  Affect:  Depressed and Tearful  Thought Process:  Coherent and Goal Directed  Orientation:  Full (Time, Place, and Person)  Thought Content:  Rumination  Suicidal Thoughts:  Yes.  with intent/plan  Homicidal Thoughts:  No  Memory:  Immediate;   Fair Recent;   Poor  Judgement:  Impaired  Insight:  Fair  Psychomotor Activity:  Decreased  Concentration:  Fair  Recall:  [employer] of Knowledge:Fair  Language: Good  Akathisia:  Negative  Handed:  Right  AIMS (if indicated):      Assets:  Communication Skills Desire for Improvement Leisure Time Social Support  ADL's:  Intact  Cognition: WNL  Sleep:   poor   Medical Decision Making: Review of Psycho-Social Stressors (1), Review or order clinical lab tests (1), Review and summation of old records (2), Established Problem, Worsening (2), Review of Last Therapy Session (1), Review or order medicine tests (1), Review of Medication Regimen & Side Effects (2) and Review of New Medication or Change in Dosage (2)  Treatment Plan Summary: Daily contact with patient to assess and evaluate symptoms and progress in treatment and Medication management  Plan:  Patient s has ymptoms of depression, anxiety, low self-esteem, hopelessness, helplessness and overwhelming  and suicidal ideation,  status post Tylenol overdose. Patient cannot contract for safety at this time and required crisis stabilization, safety monitoring and medication management for depression and anxiety  Case discussed with the psychiatric social service regarding need of acute psychiatric hospitalization when medically stable.  Suicidal: Continue Air cabin crew  Depression: Increase  Fluoxetine 20 mg daily for depression   Generalized anxiety:  Continue Gabapentin 300 mg TID for anxiety  Recommend psychiatric Inpatient admission when medically cleared. Supportive therapy provided about ongoing stressors.  Disposition: Refer to the psychiatric social service regarding appropriate psychiatric inpatient hospitalization when medically stable. [facility] as patient is refused by local hospitals as per psychiatric social service.   Durward Parcel., MD 01/30/2015 10:46 AM

## 2015-01-30 NOTE — Progress Notes (Signed)
Chaplain responded to page from pt nurse. Pt is under suicide precautions. Pt has had previous positive interactions with chaplains and is looking for "hope and faith." Pt describes it as looking for the "last puzzle piece." Chaplain and pt explored core feeling of honesty for pt and family support systems. Chaplain provided empathetic listening and explored positive coping strategies. Chaplain intends for follow up visit on Monday. Page chaplain if needed before follow up.   01/30/15 2100  Clinical Encounter Type  Visited With Patient  Visit Type Initial;Spiritual support  Referral From Patient;Nurse  Spiritual Encounters  Spiritual Needs Emotional  Stress Factors  Patient Stress Factors Loss of control  Kierah Goatley, Barbette Hair, Chaplain 01/30/2015 9:15 PM

## 2015-01-31 NOTE — Progress Notes (Signed)
UR completed 

## 2015-01-31 NOTE — Progress Notes (Signed)
[facility]                                                                              Patient Demographics  [name], is a 49 y.o. female, DOB - 21-Feb-1966, [id number]   HPI on 01/20/2015 by Dr. Montey Hora and Dr. Kara Mead [name] is a 49 y.o. F with PMH as outlined below. She was brought to [facility] ED 5/1 by EMS after a bystander saw her slumped over in her car in a nearby parking lot. [facility] first responded and found her unresponsive with ? Convulsions. [facility] found a brown paper bag with aluminum foil and a spoon in her car. No street drugs were found. She was brought to ED where she remained unresponsive to sternal rub or ammonia inhalants. She was intubated for airway protection and PCCM was called for admission.  Of note, she has hx of admission for intentional tylenol overdose as part of suicide attempt. Last admission here for this was in 2015 where tylenol level was 492. On this admission, tylenol level was 140. UDS is pending.  She was seen in consultation by neurology. She apparently was just discharged from [place] facility earlier this month and while there, she had a an EEG performed for concern of seizure vs pseudoseizures. EEG captured on of her typical events and demonstrated that it was not epileptic in nature. CT head was negative and neuro recommended obtaining ammonia and depakote levels.  Interim history Psychiatry consulted, pending placement for inpatient psych.  Pateint has had multiple admissions to psych units.    Assessment & Plan   Suicide attempt -Continue patient on suicide precaution -Patient is medically stable -Pending inpatient psych.  Per SW note: patient would be a good candidate for care coordination due to 8 admissions in 6 months.   -medications per psych. Waiting placement  Acetaminophen overdose - with hx of intentional OD as part of suicide attempt -Multi medication overdose to include Tylenol, amitriptyline,  and Soma.  -Patient's liver enzymes within normal limits currently  -Patient received N-acetylcysteine with loading dose following by maintenance infusion.Pharmacy to monitor NAC. -Acetaminophen level<10 on 5/3. Repeat level today as requested by outside facility. Tylenol level less than 10 on 5-11  Acute respiratory failure following acetaminophen/amitriptyline/soma overdose -Resolved  Respiratory acidosis -Resolved  Tobacco use disorder -Tobacco cessation counseling.  Prolonged QTc on admission  -Most likely secondary to amitriptyline overdose, resolved on subsequent EKG  Hypokalemia -Resolved, continue to monitor BMP and replace as needed   Hx psychiatric pseudoseizure, ETOH abuse, depression, anxiety -UDS positive for opiates and benzodiazepines which were expected. -Psychiatry consulted and recommended Fluoxetine 10 mg daily, gabapentin 300mg  TID for anxiety, and discontinued benzodiazepines  Bipolar disorder  -Per psychiatry -Per patient, she does not have a history of bipolar disorder  Code Status: Full  Family Communication: None at bedside.  Disposition Plan: Admitted.  Pending inpatient psychiatry placement.   Currently on wait list.   Time Spent in minutes   15 minutes  Procedures/Significant  Events CXR 5/1 >>> no acute process CT head 5/1 >>> no acute process 5/1 - brought to ED after being found unresponsive. Admitted with tylenol overdose. 01/21/15:  Extubated by eMD  Consults   Dr. Ambrose Finland (psychiatry)  DVT Prophylaxis  Patient ambulatory , refuse Lovenox. I have discontinue this medication.   Lab Results  Component Value Date   PLT 416* 01/29/2015    Medications  Scheduled Meds: . diphenhydrAMINE  25 mg Oral Once  . feeding supplement (RESOURCE BREEZE)  1 Container Oral Q24H  . FLUoxetine  20 mg Oral Daily  . folic acid  1 mg Oral Daily  . gabapentin  300 mg Oral TID  . pantoprazole  40 mg Oral Q1200  . polyethylene  glycol  17 g Oral Daily  . thiamine  100 mg Oral Daily   Continuous Infusions:  PRN Meds:.benzocaine, butalbital-acetaminophen-caffeine, ondansetron, oxyCODONE, phenol  Antibiotics    Anti-infectives    None      Subjective:  No chest pain, no dyspnea. Has mild headaches./   Objective:   Filed Vitals:   01/30/15 2206 01/31/15 0151 01/31/15 0547 01/31/15 0944  BP: 114/68 105/55 103/59 127/80  Pulse: 75 80 78 80  Temp: 97.9 F (36.6 C) 97.9 F (36.6 C) 98.4 F (36.9 C) 98.2 F (36.8 C)  TempSrc: Oral Oral Oral Oral  Resp: 18 18 18 20   Weight:   62.869 kg (138 lb 9.6 oz)   SpO2: 98% 97% 97% 98%    Wt Readings from Last 3 Encounters:  01/31/15 62.869 kg (138 lb 9.6 oz)  12/15/14 62.3 kg (137 lb 5.6 oz)  10/27/14 54.9 kg (121 lb 0.5 oz)     Intake/Output Summary (Last 24 hours) at 01/31/15 1350 Last data filed at 01/31/15 1211  Gross per 24 hour  Intake    580 ml  Output      0 ml  Net    580 ml    Exam  General:  no distress  HEENT: NCAT, mucous membranes moist.  Cardiovascular: S1 S2 auscultated, no murmurs, RRR  Respiratory: Clear to auscultation  Abdomen: Soft, nontender, nondistended, + bowel sounds    Data Review   Micro Results No results found for this or any previous visit (from the past 240 hour(s)).  Radiology Reports Ct Head Wo Contrast  01/19/2015   CLINICAL DATA:  Found in car unresponsive  EXAM: CT HEAD WITHOUT CONTRAST  TECHNIQUE: Contiguous axial images were obtained from the base of the skull through the vertex without intravenous contrast.  COMPARISON:  12/10/2014  FINDINGS: There is no intracranial hemorrhage, mass or evidence of acute infarction. Gray matter and white matter are normal. The ventricles and basal cisterns appear unremarkable.  The bony structures are intact. The visible portions of the paranasal sinuses are clear.  IMPRESSION: Normal brain   Electronically Signed   By: Andreas Newport M.D.   On: 01/19/2015 23:40    Dg Chest Port 1 View  01/22/2015   CLINICAL DATA:  Common a overdose, respiratory failure  EXAM: PORTABLE CHEST - 1 VIEW  COMPARISON:  Portable chest x-ray of Jan 21, 2015  FINDINGS: There is been interval extubation of the trachea and esophagus. The lungs are reasonably well inflated. There is minimal subsegmental atelectasis at the bases. There is no pleural effusion. The cardiac silhouette is top-normal in size. The pulmonary vascularity is not engorged. The bony thorax is unremarkable.  IMPRESSION: Interval extubation of the trachea and esophagus. Minimal persistent bibasilar subsegmental atelectasis.   Electronically Signed   By: David  Martinique M.D.   On: 01/22/2015 07:36   Portable Chest Xray  01/21/2015   CLINICAL DATA:  Respiratory failure .  EXAM: PORTABLE CHEST - 1 VIEW  COMPARISON:  01/19/2015.  FINDINGS: Endotracheal tube in stable position. Interim placement of NG tube, its tip is projected over the stomach. Mediastinum hilar structures are normal. Bibasilar atelectasis and/or infiltrates again noted. Small bilateral pleural effusions cannot be excluded. No pneumothorax.  IMPRESSION: 1. Interim placement of NG tube, its tip is below the left hemidiaphragm. Endotracheal tube in stable position . 2. Stable bibasilar atelectasis and/or infiltrates .   Electronically Signed   By: Marcello Moores  Register   On: 01/21/2015 07:25   Dg Chest Portable 1 View  01/19/2015   CLINICAL DATA:  Overdose, unresponsive  EXAM: PORTABLE CHEST - 1 VIEW  COMPARISON:  11/25/2014  FINDINGS: Endotracheal tube tip is 3.8 cm above the carina. There is minimal curvilinear atelectasis above the slightly elevated right hemidiaphragm. The lungs are otherwise clear. There is no large effusion. Hilar, mediastinal and cardiac contours appear unremarkable.  IMPRESSION: Satisfactory ET tube position. Minimal linear atelectatic appearing opacities in the right base.   Electronically Signed   By: Andreas Newport M.D.   On: 01/19/2015 23:27     CBC  Recent Labs Lab 01/25/15 0628 01/26/15 0734 01/29/15 0427  WBC 7.4 7.5 8.3  HGB 12.1 13.4 13.2  HCT 35.9* 39.3 39.9  PLT 308 308 416*  MCV 94.0 93.3 94.1  MCH 31.7 31.8 31.1  MCHC 33.7 34.1 33.1  RDW 12.6 12.6 12.6  LYMPHSABS 3.8 3.6  --   MONOABS 0.6 0.8  --   EOSABS 0.3 0.4  --   BASOSABS 0.0 0.0  --     Chemistries   Recent Labs Lab 01/25/15 0628 01/26/15 0734 01/29/15 0427  NA 135 138 137  K 4.4 4.7 4.5  CL 98* 99* 97*  CO2 27 28 25   GLUCOSE 81 78 81  BUN 9 10 15   CREATININE 0.74 0.68 0.66  CALCIUM 9.4 9.6 9.8  MG 2.0 2.3  --    ------------------------------------------------------------------------------------------------------------------ estimated creatinine clearance is 73 mL/min (by C-G formula based on Cr of 0.66). ------------------------------------------------------------------------------------------------------------------ No results for input(s): HGBA1C in the last 72 hours. ------------------------------------------------------------------------------------------------------------------ No results for input(s): CHOL, HDL, LDLCALC, TRIG, CHOLHDL, LDLDIRECT in the last 72 hours. ------------------------------------------------------------------------------------------------------------------ No results for input(s): TSH, T4TOTAL, T3FREE, THYROIDAB in the last 72 hours.  Invalid input(s): FREET3 ------------------------------------------------------------------------------------------------------------------ No results for input(s): VITAMINB12, FOLATE, FERRITIN, TIBC, IRON, RETICCTPCT in the last 72 hours.  Coagulation profile No results for input(s): INR, PROTIME in the last 168 hours.  No results for input(s): DDIMER in the last 72 hours.  Cardiac Enzymes No results for input(s): CKMB, TROPONINI, MYOGLOBIN in the last 168 hours.  Invalid input(s):  CK ------------------------------------------------------------------------------------------------------------------ Invalid input(s): POCBNP    Joaovictor Krone A MD. on 01/31/2015 at 1:50 PM [phone].   After 7pm go to [email or web address] - password [email or web address]  And look for the night coverage person covering for me after hours  [facility] Office  [phone]

## 2015-02-01 NOTE — Progress Notes (Signed)
[facility]                                                                              Patient Demographics  [name], is a 49 y.o. female, DOB - 10/23/1965, AJG:[id number]   HPI on 01/20/2015 by Dr. Montey Hora and Dr. Kara Mead [name] is a 49 y.o. F with PMH as outlined below. She was brought to [facility] ED 5/1 by EMS after a bystander saw her slumped over in her car in a nearby parking lot. [facility] first responded and found her unresponsive with ? Convulsions. [facility]PD found a brown paper bag with aluminum foil and a spoon in her car. No street drugs were found. She was brought to ED where she remained unresponsive to sternal rub or ammonia inhalants. She was intubated for airway protection and PCCM was called for admission.  Of note, she has hx of admission for intentional tylenol overdose as part of suicide attempt. Last admission here for this was in 2015 where tylenol level was 492. On this admission, tylenol level was 140. UDS is pending.  She was seen in consultation by neurology. She apparently was just discharged from [facility] facility earlier this month and while there, she had a an EEG performed for concern of seizure vs pseudoseizures. EEG captured on of her typical events and demonstrated that it was not epileptic in nature. CT head was negative and neuro recommended obtaining ammonia and depakote levels.  Interim history Psychiatry consulted, pending placement for inpatient psych.  Pateint has had multiple admissions to psych units.    Assessment & Plan   Suicide attempt -Continue patient on suicide precaution -Patient is medically stable -Pending inpatient psych.  Per SW note: patient would be a good candidate for care coordination due to 8 admissions in 6 months.   -medications per psych. Waiting placement  Acetaminophen overdose - with hx of intentional OD as part of suicide attempt -Multi medication overdose to include Tylenol, amitriptyline,  and Soma.  -Patient's liver enzymes within normal limits currently  -Patient received N-acetylcysteine with loading dose following by maintenance infusion.Pharmacy to monitor NAC. -Acetaminophen level<10 on 5/3. -Tylenol level less than 10 on 5-11  Acute respiratory failure following acetaminophen/amitriptyline/soma overdose -Resolved  Respiratory acidosis -Resolved  Tobacco use disorder -Tobacco cessation counseling.  Prolonged QTc on admission  -Most likely secondary to amitriptyline overdose, resolved on subsequent EKG  Hypokalemia -Resolved, continue to monitor BMP and replace as needed   Hx psychiatric pseudoseizure, ETOH abuse, depression, anxiety -UDS positive for opiates and benzodiazepines which were expected. -Psychiatry consulted and recommended Fluoxetine 10 mg daily, gabapentin 300mg  TID for anxiety, and discontinued benzodiazepines  Bipolar disorder  -Per psychiatry -Per patient, she does not have a history of bipolar disorder  Code Status: Full  Family Communication: None at bedside.  Disposition Plan: Admitted.  Pending inpatient psychiatry placement.   Currently on wait list.   Time Spent in minutes   15 minutes  Procedures/Significant  Events CXR 5/1 >>> no acute process CT head 5/1 >>> no acute process 5/1 - brought to ED after being found unresponsive. Admitted with tylenol overdose. 01/21/15: Extubated by eMD  Consults   Dr.  Ambrose Finland (psychiatry)  DVT Prophylaxis  Patient ambulatory , refuse Lovenox. I have discontinue this medication.   Lab Results  Component Value Date   PLT 416* 01/29/2015    Medications  Scheduled Meds: . diphenhydrAMINE  25 mg Oral Once  . feeding supplement (RESOURCE BREEZE)  1 Container Oral Q24H  . FLUoxetine  20 mg Oral Daily  . folic acid  1 mg Oral Daily  . gabapentin  300 mg Oral TID  . pantoprazole  40 mg Oral Q1200  . polyethylene glycol  17 g Oral Daily  . thiamine  100 mg Oral Daily     Continuous Infusions:  PRN Meds:.benzocaine, butalbital-acetaminophen-caffeine, ondansetron, oxyCODONE, phenol  Antibiotics    Anti-infectives    None      Subjective:  No complaints. She is eating ice cream,   Objective:   Filed Vitals:   02/01/15 0156 02/01/15 0500 02/01/15 0613 02/01/15 0900  BP: 117/74  121/75 127/89  Pulse: 85  64 83  Temp: 98.6 F (37 C)  98.2 F (36.8 C) 96.5 F (35.8 C)  TempSrc: Oral  Oral Axillary  Resp: 18  18   Weight:  63.504 kg (140 lb)    SpO2: 95%  98% 100%    Wt Readings from Last 3 Encounters:  02/01/15 63.504 kg (140 lb)  12/15/14 62.3 kg (137 lb 5.6 oz)  10/27/14 54.9 kg (121 lb 0.5 oz)    No intake or output data in the 24 hours ending 02/01/15 1405  Exam  General:  no distress  HEENT: NCAT, mucous membranes moist.  Cardiovascular: S1 S2 auscultated, no murmurs, RRR  Respiratory: Clear to auscultation  Abdomen: Soft, nontender, nondistended, + bowel sounds    Data Review   Micro Results No results found for this or any previous visit (from the past 240 hour(s)).  Radiology Reports Ct Head Wo Contrast  01/19/2015   CLINICAL DATA:  Found in car unresponsive  EXAM: CT HEAD WITHOUT CONTRAST  TECHNIQUE: Contiguous axial images were obtained from the base of the skull through the vertex without intravenous contrast.  COMPARISON:  12/10/2014  FINDINGS: There is no intracranial hemorrhage, mass or evidence of acute infarction. Gray matter and white matter are normal. The ventricles and basal cisterns appear unremarkable.  The bony structures are intact. The visible portions of the paranasal sinuses are clear.  IMPRESSION: Normal brain   Electronically Signed   By: Andreas Newport M.D.   On: 01/19/2015 23:40   Dg Chest Port 1 View  01/22/2015   CLINICAL DATA:  Common a overdose, respiratory failure  EXAM: PORTABLE CHEST - 1 VIEW  COMPARISON:  Portable chest x-ray of Jan 21, 2015  FINDINGS: There is been interval extubation  of the trachea and esophagus. The lungs are reasonably well inflated. There is minimal subsegmental atelectasis at the bases. There is no pleural effusion. The cardiac silhouette is top-normal in size. The pulmonary vascularity is not engorged. The bony thorax is unremarkable.  IMPRESSION: Interval extubation of the trachea and esophagus. Minimal persistent bibasilar subsegmental atelectasis.   Electronically Signed   By: David  Martinique M.D.   On: 01/22/2015 07:36   Portable Chest Xray  01/21/2015   CLINICAL DATA:  Respiratory failure .  EXAM: PORTABLE CHEST - 1 VIEW  COMPARISON:  01/19/2015.  FINDINGS: Endotracheal tube in stable position. Interim placement of NG tube, its tip is projected over the stomach. Mediastinum hilar structures are normal. Bibasilar atelectasis and/or infiltrates again noted. Small bilateral pleural  effusions cannot be excluded. No pneumothorax.  IMPRESSION: 1. Interim placement of NG tube, its tip is below the left hemidiaphragm. Endotracheal tube in stable position . 2. Stable bibasilar atelectasis and/or infiltrates .   Electronically Signed   By: Marcello Moores  Register   On: 01/21/2015 07:25   Dg Chest Portable 1 View  01/19/2015   CLINICAL DATA:  Overdose, unresponsive  EXAM: PORTABLE CHEST - 1 VIEW  COMPARISON:  11/25/2014  FINDINGS: Endotracheal tube tip is 3.8 cm above the carina. There is minimal curvilinear atelectasis above the slightly elevated right hemidiaphragm. The lungs are otherwise clear. There is no large effusion. Hilar, mediastinal and cardiac contours appear unremarkable.  IMPRESSION: Satisfactory ET tube position. Minimal linear atelectatic appearing opacities in the right base.   Electronically Signed   By: Andreas Newport M.D.   On: 01/19/2015 23:27    CBC  Recent Labs Lab 01/26/15 0734 01/29/15 0427  WBC 7.5 8.3  HGB 13.4 13.2  HCT 39.3 39.9  PLT 308 416*  MCV 93.3 94.1  MCH 31.8 31.1  MCHC 34.1 33.1  RDW 12.6 12.6  LYMPHSABS 3.6  --   MONOABS 0.8   --   EOSABS 0.4  --   BASOSABS 0.0  --     Chemistries   Recent Labs Lab 01/26/15 0734 01/29/15 0427  NA 138 137  K 4.7 4.5  CL 99* 97*  CO2 28 25  GLUCOSE 78 81  BUN 10 15  CREATININE 0.68 0.66  CALCIUM 9.6 9.8  MG 2.3  --    ------------------------------------------------------------------------------------------------------------------ estimated creatinine clearance is 73.4 mL/min (by C-G formula based on Cr of 0.66). ------------------------------------------------------------------------------------------------------------------ No results for input(s): HGBA1C in the last 72 hours. ------------------------------------------------------------------------------------------------------------------ No results for input(s): CHOL, HDL, LDLCALC, TRIG, CHOLHDL, LDLDIRECT in the last 72 hours. ------------------------------------------------------------------------------------------------------------------ No results for input(s): TSH, T4TOTAL, T3FREE, THYROIDAB in the last 72 hours.  Invalid input(s): FREET3 ------------------------------------------------------------------------------------------------------------------ No results for input(s): VITAMINB12, FOLATE, FERRITIN, TIBC, IRON, RETICCTPCT in the last 72 hours.  Coagulation profile No results for input(s): INR, PROTIME in the last 168 hours.  No results for input(s): DDIMER in the last 72 hours.  Cardiac Enzymes No results for input(s): CKMB, TROPONINI, MYOGLOBIN in the last 168 hours.  Invalid input(s): CK ------------------------------------------------------------------------------------------------------------------ Invalid input(s): POCBNP    Regalado, Belkys A MD. on 02/01/2015 at 2:05 PM [phone].   After 7pm go to [email or web address] - password [email or web address]  And look for the night coverage person covering for me after hours  [facility] Office  [phone]

## 2015-02-01 NOTE — Clinical Social Work Note (Signed)
Clinical Social Worker has contacted [facility] to confirm patient remains on waiting list for inpatient psychiatric placement.   CSW will continue to follow patient for continued support and to facilitate pt's discharge needs once bed becomes available.   Glendon Axe, MSW, LCSWA [phone] 02/01/2015 10:05 AM

## 2015-02-02 LAB — BASIC METABOLIC PANEL
Anion gap: 11 (ref 5–15)
BUN: 9 mg/dL (ref 6–20)
CHLORIDE: 99 mmol/L — AB (ref 101–111)
CO2: 28 mmol/L (ref 22–32)
Calcium: 9.8 mg/dL (ref 8.9–10.3)
Creatinine, Ser: 0.72 mg/dL (ref 0.44–1.00)
GFR calc non Af Amer: >60 mL/min (ref >60)
GLUCOSE: 139 mg/dL — AB (ref 65–99)
Potassium: 4.1 mmol/L (ref 3.5–5.1)
SODIUM: 138 mmol/L (ref 135–145)

## 2015-02-02 MED ORDER — METHOCARBAMOL 500 MG PO TABS
500.0000 mg | ORAL_TABLET | Freq: Three times a day (TID) | ORAL | Status: DC | PRN
  Administered 2015-02-02 – 2015-02-04 (×6): 500 mg via ORAL
  Filled 2015-02-02 (×6): qty 1

## 2015-02-02 MED ORDER — ALUM & MAG HYDROXIDE-SIMETH 200-200-20 MG/5ML PO SUSP
30.0000 mL | Freq: Once | ORAL | Status: AC
  Administered 2015-02-02: 30 mL via ORAL
  Filled 2015-02-02: qty 30

## 2015-02-02 NOTE — Progress Notes (Signed)
[facility]                                                                              Patient Demographics  [name], is a 49 y.o. female, DOB - Jun 19, 1966, ACZ:[id number]   HPI on 01/20/2015 by Dr. Montey Hora and Dr. Kara Mead [name] is a 49 y.o. F with PMH as outlined below. She was brought to [facility] ED 5/1 by EMS after a bystander saw her slumped over in her car in a nearby parking lot. [facility] first responded and found her unresponsive with ? Convulsions. [facility] found a brown paper bag with aluminum foil and a spoon in her car. No street drugs were found. She was brought to ED where she remained unresponsive to sternal rub or ammonia inhalants. She was intubated for airway protection and PCCM was called for admission.  Of note, she has hx of admission for intentional tylenol overdose as part of suicide attempt. Last admission here for this was in 2015 where tylenol level was 492. On this admission, tylenol level was 140. UDS is pending.  She was seen in consultation by neurology. She apparently was just discharged from [place] facility earlier this month and while there, she had a an EEG performed for concern of seizure vs pseudoseizures. EEG captured on of her typical events and demonstrated that it was not epileptic in nature. CT head was negative and neuro recommended obtaining ammonia and depakote levels.  Interim history Psychiatry consulted, pending placement for inpatient psych.  Pateint has had multiple admissions to psych units.    Assessment & Plan   Suicide attempt -Continue patient on suicide precaution -Patient is medically stable -Pending inpatient psych.  Per SW note: patient would be a good candidate for care coordination due to 8 admissions in 6 months.   -medications per psych. Waiting placement  Acetaminophen overdose - with hx of intentional OD as part of suicide attempt -Multi medication overdose to include Tylenol, amitriptyline,  and Soma.  -Patient's liver enzymes within normal limits currently  -Patient received N-acetylcysteine with loading dose following by maintenance infusion.Pharmacy to monitor NAC. -Acetaminophen level<10 on 5/3. -Tylenol level less than 10 on 5-11  Neck pain, back pain: Suspect MSK.  Start Robaxin PRN>   Acute respiratory failure following acetaminophen/amitriptyline/soma overdose -Resolved  Respiratory acidosis -Resolved  Tobacco use disorder -Tobacco cessation counseling.  Prolonged QTc on admission  -Most likely secondary to amitriptyline overdose, resolved on subsequent EKG  Hypokalemia -Resolved, continue to monitor BMP and replace as needed   Hx psychiatric pseudoseizure, ETOH abuse, depression, anxiety -UDS positive for opiates and benzodiazepines which were expected. -Psychiatry consulted and recommended Fluoxetine 10 mg daily, gabapentin 300mg  TID for anxiety, and discontinued benzodiazepines  Bipolar disorder  -Per psychiatry -Per patient, she does not have a history of bipolar disorder  Code Status: Full  Family Communication: None at bedside.  Disposition Plan: Admitted.  Pending inpatient psychiatry placement.   Currently on waiting list.   Time Spent in minutes   15 minutes  Procedures/Significant  Events CXR 5/1 >>> no acute process CT head 5/1 >>> no acute process 5/1 - brought to ED after being found unresponsive. Admitted  with tylenol overdose. 01/21/15: Extubated by eMD  Consults   Dr. Ambrose Finland (psychiatry)  DVT Prophylaxis  Patient ambulatory , refuse Lovenox. I have discontinue this medication.   Lab Results  Component Value Date   PLT 416* 01/29/2015    Medications  Scheduled Meds: . diphenhydrAMINE  25 mg Oral Once  . feeding supplement (RESOURCE BREEZE)  1 Container Oral Q24H  . FLUoxetine  20 mg Oral Daily  . folic acid  1 mg Oral Daily  . gabapentin  300 mg Oral TID  . pantoprazole  40 mg Oral Q1200  .  polyethylene glycol  17 g Oral Daily  . thiamine  100 mg Oral Daily   Continuous Infusions:  PRN Meds:.benzocaine, butalbital-acetaminophen-caffeine, methocarbamol, ondansetron, oxyCODONE, phenol  Antibiotics    Anti-infectives    None      Subjective:  Complaining of back, neck pain.   Objective:   Filed Vitals:   02/01/15 2242 02/02/15 0144 02/02/15 0616 02/02/15 1057  BP: 118/71 124/82 119/81 120/65  Pulse: 73 62 72 73  Temp: 98.1 F (36.7 C) 98.4 F (36.9 C) 98.1 F (36.7 C) 98.1 F (36.7 C)  TempSrc: Oral Oral Oral Oral  Resp: 18 18 18 18   Height:      Weight:      SpO2: 97% 99% 95% 99%    Wt Readings from Last 3 Encounters:  02/01/15 63 kg (138 lb 14.2 oz)  12/15/14 62.3 kg (137 lb 5.6 oz)  10/27/14 54.9 kg (121 lb 0.5 oz)     Intake/Output Summary (Last 24 hours) at 02/02/15 1420 Last data filed at 02/02/15 0900  Gross per 24 hour  Intake    940 ml  Output      0 ml  Net    940 ml    Exam  General:  no distress  HEENT: NCAT, mucous membranes moist.  Cardiovascular: S1 S2 auscultated, no murmurs, RRR  Respiratory: Clear to auscultation  Abdomen: Soft, nontender, nondistended, + bowel sounds    Data Review   Micro Results No results found for this or any previous visit (from the past 240 hour(s)).  Radiology Reports Ct Head Wo Contrast  01/19/2015   CLINICAL DATA:  Found in car unresponsive  EXAM: CT HEAD WITHOUT CONTRAST  TECHNIQUE: Contiguous axial images were obtained from the base of the skull through the vertex without intravenous contrast.  COMPARISON:  12/10/2014  FINDINGS: There is no intracranial hemorrhage, mass or evidence of acute infarction. Gray matter and white matter are normal. The ventricles and basal cisterns appear unremarkable.  The bony structures are intact. The visible portions of the paranasal sinuses are clear.  IMPRESSION: Normal brain   Electronically Signed   By: Andreas Newport M.D.   On: 01/19/2015 23:40   Dg  Chest Port 1 View  01/22/2015   CLINICAL DATA:  Common a overdose, respiratory failure  EXAM: PORTABLE CHEST - 1 VIEW  COMPARISON:  Portable chest x-ray of Jan 21, 2015  FINDINGS: There is been interval extubation of the trachea and esophagus. The lungs are reasonably well inflated. There is minimal subsegmental atelectasis at the bases. There is no pleural effusion. The cardiac silhouette is top-normal in size. The pulmonary vascularity is not engorged. The bony thorax is unremarkable.  IMPRESSION: Interval extubation of the trachea and esophagus. Minimal persistent bibasilar subsegmental atelectasis.   Electronically Signed   By: David  Martinique M.D.   On: 01/22/2015 07:36   Portable Chest Xray  01/21/2015  CLINICAL DATA:  Respiratory failure .  EXAM: PORTABLE CHEST - 1 VIEW  COMPARISON:  01/19/2015.  FINDINGS: Endotracheal tube in stable position. Interim placement of NG tube, its tip is projected over the stomach. Mediastinum hilar structures are normal. Bibasilar atelectasis and/or infiltrates again noted. Small bilateral pleural effusions cannot be excluded. No pneumothorax.  IMPRESSION: 1. Interim placement of NG tube, its tip is below the left hemidiaphragm. Endotracheal tube in stable position . 2. Stable bibasilar atelectasis and/or infiltrates .   Electronically Signed   By: Marcello Moores  Register   On: 01/21/2015 07:25   Dg Chest Portable 1 View  01/19/2015   CLINICAL DATA:  Overdose, unresponsive  EXAM: PORTABLE CHEST - 1 VIEW  COMPARISON:  11/25/2014  FINDINGS: Endotracheal tube tip is 3.8 cm above the carina. There is minimal curvilinear atelectasis above the slightly elevated right hemidiaphragm. The lungs are otherwise clear. There is no large effusion. Hilar, mediastinal and cardiac contours appear unremarkable.  IMPRESSION: Satisfactory ET tube position. Minimal linear atelectatic appearing opacities in the right base.   Electronically Signed   By: Andreas Newport M.D.   On: 01/19/2015 23:27     CBC  Recent Labs Lab 01/29/15 0427  WBC 8.3  HGB 13.2  HCT 39.9  PLT 416*  MCV 94.1  MCH 31.1  MCHC 33.1  RDW 12.6    Chemistries   Recent Labs Lab 01/29/15 0427  NA 137  K 4.5  CL 97*  CO2 25  GLUCOSE 81  BUN 15  CREATININE 0.66  CALCIUM 9.8   ------------------------------------------------------------------------------------------------------------------ estimated creatinine clearance is 75.1 mL/min (by C-G formula based on Cr of 0.66). ------------------------------------------------------------------------------------------------------------------ No results for input(s): HGBA1C in the last 72 hours. ------------------------------------------------------------------------------------------------------------------ No results for input(s): CHOL, HDL, LDLCALC, TRIG, CHOLHDL, LDLDIRECT in the last 72 hours. ------------------------------------------------------------------------------------------------------------------ No results for input(s): TSH, T4TOTAL, T3FREE, THYROIDAB in the last 72 hours.  Invalid input(s): FREET3 ------------------------------------------------------------------------------------------------------------------ No results for input(s): VITAMINB12, FOLATE, FERRITIN, TIBC, IRON, RETICCTPCT in the last 72 hours.  Coagulation profile No results for input(s): INR, PROTIME in the last 168 hours.  No results for input(s): DDIMER in the last 72 hours.  Cardiac Enzymes No results for input(s): CKMB, TROPONINI, MYOGLOBIN in the last 168 hours.  Invalid input(s): CK ------------------------------------------------------------------------------------------------------------------ Invalid input(s): POCBNP    Regalado, Belkys A MD. on 02/02/2015 at 2:20 PM [phone].   After 7pm go to [email or web address] - password [email or web address]  And look for the night coverage person covering for me after hours  [facility] Office  [phone]

## 2015-02-03 ENCOUNTER — Inpatient Hospital Stay (HOSPITAL_COMMUNITY): Payer: Medicaid Other

## 2015-02-03 LAB — BASIC METABOLIC PANEL
ANION GAP: 11 (ref 5–15)
BUN: 9 mg/dL (ref 6–20)
CALCIUM: 9.5 mg/dL (ref 8.9–10.3)
CO2: 28 mmol/L (ref 22–32)
Chloride: 99 mmol/L — ABNORMAL LOW (ref 101–111)
Creatinine, Ser: 0.66 mg/dL (ref 0.44–1.00)
GFR calc non Af Amer: >60 mL/min (ref >60)
Glucose, Bld: 86 mg/dL (ref 65–99)
Potassium: 3.9 mmol/L (ref 3.5–5.1)
SODIUM: 138 mmol/L (ref 135–145)

## 2015-02-03 LAB — CBC
HCT: 37.4 % (ref 36.0–46.0)
HCT: 36.8 % (ref 36.0–46.0)
Hemoglobin: 12.4 g/dL (ref 12.0–15.0)
Hemoglobin: 11.8 g/dL — ABNORMAL LOW (ref 12.0–15.0)
MCH: 31.2 pg (ref 26.0–34.0)
MCH: 30.6 pg (ref 26.0–34.0)
MCHC: 33.2 g/dL (ref 30.0–36.0)
MCHC: 32.1 g/dL (ref 30.0–36.0)
MCV: 94 fL (ref 78.0–100.0)
MCV: 95.6 fL (ref 78.0–100.0)
Platelets: 562 10*3/uL — ABNORMAL HIGH (ref 150–400)
Platelets: 535 10*3/uL — ABNORMAL HIGH (ref 150–400)
RBC: 3.98 MIL/uL (ref 3.87–5.11)
RBC: 3.85 MIL/uL — ABNORMAL LOW (ref 3.87–5.11)
RDW: 12.8 % (ref 11.5–15.5)
RDW: 12.8 % (ref 11.5–15.5)
WBC: 7.8 10*3/uL (ref 4.0–10.5)
WBC: 8.7 10*3/uL (ref 4.0–10.5)

## 2015-02-03 MED ORDER — SODIUM CHLORIDE 0.9 % IV SOLN
INTRAVENOUS | Status: DC
  Administered 2015-02-03 – 2015-02-06 (×4): via INTRAVENOUS

## 2015-02-03 MED ORDER — SODIUM CHLORIDE 0.9 % IJ SOLN
10.0000 mL | INTRAMUSCULAR | Status: DC | PRN
  Administered 2015-02-03 – 2015-02-06 (×4): 10 mL
  Filled 2015-02-03 (×4): qty 40

## 2015-02-03 MED ORDER — PANTOPRAZOLE SODIUM 40 MG IV SOLR
40.0000 mg | Freq: Two times a day (BID) | INTRAVENOUS | Status: DC
  Administered 2015-02-03 – 2015-02-06 (×6): 40 mg via INTRAVENOUS
  Filled 2015-02-03 (×7): qty 40

## 2015-02-03 MED ORDER — LORAZEPAM 0.5 MG PO TABS
0.5000 mg | ORAL_TABLET | Freq: Once | ORAL | Status: AC
  Administered 2015-02-03: 0.5 mg via ORAL
  Filled 2015-02-03: qty 1

## 2015-02-03 MED ORDER — ALUM & MAG HYDROXIDE-SIMETH 200-200-20 MG/5ML PO SUSP: 30.0000 mL | Freq: Four times a day (QID) | ORAL | Status: DC | PRN

## 2015-02-03 MED ORDER — DIPHENHYDRAMINE HCL 25 MG PO CAPS: 50.0000 mg | ORAL_CAPSULE | Freq: Four times a day (QID) | ORAL | Status: DC | PRN

## 2015-02-03 MED ORDER — BARIUM SULFATE 2.1 % PO SUSP
450.0000 mL | Freq: Two times a day (BID) | ORAL | Status: DC
  Administered 2015-02-03 (×2): 450 mL via ORAL

## 2015-02-03 NOTE — Progress Notes (Signed)
LCSW in ED received call from [facility] Re: [name]. LCSW confirmed patient still needs placement at [facility]. Patient remains on the wait list. No new labs or medical needs at this time.  [name] reports she will call once a bed is available. Unknown time frame at this time.  Lane Hacker, MSW Clinical Social Work: Emergency Room [phone]

## 2015-02-03 NOTE — Progress Notes (Signed)
Chaplain initiated follow up with pt. Chaplain paged away but let pt know she was thinking about her and will attempt follow up at a later time. Page chaplain if needed before follow up. Paighton Godette, Barbette Hair, Chaplain 02/03/2015 3:19 PM

## 2015-02-03 NOTE — Progress Notes (Signed)
Peripherally Inserted Central Catheter/Midline Placement  The IV Nurse has discussed with the patient and/or persons authorized to consent for the patient, the purpose of this procedure and the potential benefits and risks involved with this procedure.  The benefits include less needle sticks, lab draws from the catheter and patient may be discharged home with the catheter.  Risks include, but not limited to, infection, bleeding, blood clot (thrombus formation), and puncture of an artery; nerve damage and irregular heat beat.  Alternatives to this procedure were also discussed.  PICC/Midline Placement Documentation        [name] 02/03/2015, 7:02 PM

## 2015-02-03 NOTE — Progress Notes (Signed)
[facility]                                                                              Patient Demographics  [name], is a 49 y.o. female, DOB - 1966-03-19, BMW:[id number]   HPI on 01/20/2015 by Dr. Montey Hora and Dr. Kara Mead [name] is a 49 y.o. F with PMH as outlined below. She was brought to [facility] ED 5/1 by EMS after a bystander saw her slumped over in her car in a nearby parking lot. [facility] first responded and found her unresponsive with ? Convulsions. [facility] found a brown paper bag with aluminum foil and a spoon in her car. No street drugs were found. She was brought to ED where she remained unresponsive to sternal rub or ammonia inhalants. She was intubated for airway protection and PCCM was called for admission.  Of note, she has hx of admission for intentional tylenol overdose as part of suicide attempt. Last admission here for this was in 2015 where tylenol level was 492. On this admission, tylenol level was 140. UDS is pending.  She was seen in consultation by neurology. She apparently was just discharged from [place] facility earlier this month and while there, she had a an EEG performed for concern of seizure vs pseudoseizures. EEG captured on of her typical events and demonstrated that it was not epileptic in nature. CT head was negative and neuro recommended obtaining ammonia and depakote levels.  Interim history Psychiatry consulted, pending placement for inpatient psych.  Pateint has had multiple admissions to psych units.    Assessment & Plan   Suicide attempt -Continue patient on suicide precaution -Patient is medically stable -Pending inpatient psych.  Per SW note: patient would be a good candidate for care coordination due to 8 admissions in 6 months.   -medications per psych. Waiting placement  Abdominal pain, diarrhea; Possible blood in the stool.  Start IV protonix.  Cycle Hb.  Check CT abdomen.   Acetaminophen overdose -  with hx of intentional OD as part of suicide attempt -Multi medication overdose to include Tylenol, amitriptyline, and Soma.  -Patient's liver enzymes within normal limits currently  -Patient received N-acetylcysteine with loading dose following by maintenance infusion.Pharmacy to monitor NAC. -Acetaminophen level<10 on 5/3. -Tylenol level less than 10 on 5-11  Neck pain, back pain: Suspect MSK.  Started Robaxin PRN>   Acute respiratory failure following acetaminophen/amitriptyline/soma overdose -Resolved  Respiratory acidosis -Resolved  Tobacco use disorder -Tobacco cessation counseling.  Prolonged QTc on admission  -Most likely secondary to amitriptyline overdose, resolved on subsequent EKG  Hypokalemia -Resolved, continue to monitor BMP and replace as needed   Hx psychiatric pseudoseizure, ETOH abuse, depression, anxiety -UDS positive for opiates and benzodiazepines which were expected. -Psychiatry consulted and recommended Fluoxetine 10 mg daily, gabapentin 300mg  TID for anxiety, and discontinued benzodiazepines  Bipolar disorder  -Per psychiatry -Per patient, she does not have a history of bipolar disorder  Code Status: Full  Family Communication: None at bedside.  Disposition Plan: Admitted.  Pending inpatient psychiatry placement.   Currently on waiting list.   Time Spent in minutes   15 minutes  Procedures/Significant  Events CXR 5/1 >>>  no acute process CT head 5/1 >>> no acute process 5/1 - brought to ED after being found unresponsive. Admitted with tylenol overdose. 01/21/15: Extubated by eMD  Consults   Dr. Ambrose Finland (psychiatry)  DVT Prophylaxis  Patient ambulatory , refuse Lovenox. I have discontinue this medication.   Lab Results  Component Value Date   PLT 416* 01/29/2015    Medications  Scheduled Meds: . diphenhydrAMINE  25 mg Oral Once  . feeding supplement (RESOURCE BREEZE)  1 Container Oral Q24H  . FLUoxetine  20 mg  Oral Daily  . folic acid  1 mg Oral Daily  . gabapentin  300 mg Oral TID  . pantoprazole (PROTONIX) IV  40 mg Intravenous Q12H  . thiamine  100 mg Oral Daily   Continuous Infusions: . sodium chloride     PRN Meds:.alum & mag hydroxide-simeth, benzocaine, butalbital-acetaminophen-caffeine, diphenhydrAMINE, methocarbamol, ondansetron, oxyCODONE, phenol  Antibiotics    Anti-infectives    None      Subjective:  Complaining today of abdominal pain, cramping pain. Diarrhea multiple episode.  Possible blood in the stool.  She has H./O colitis and ulcer.   Objective:   Filed Vitals:   02/02/15 1813 02/02/15 2245 02/03/15 0214 02/03/15 1001  BP: 108/63 103/66 108/62 112/59  Pulse: 73 84 74 79  Temp: 98.6 F (37 C) 98.6 F (37 C) 98.1 F (36.7 C)   TempSrc:  Oral Oral   Resp: 20 17 16 16   Height:      Weight:      SpO2: 100% 99% 97% 99%    Wt Readings from Last 3 Encounters:  02/01/15 63 kg (138 lb 14.2 oz)  12/15/14 62.3 kg (137 lb 5.6 oz)  10/27/14 54.9 kg (121 lb 0.5 oz)     Intake/Output Summary (Last 24 hours) at 02/03/15 1434 Last data filed at 02/03/15 1200  Gross per 24 hour  Intake    600 ml  Output      0 ml  Net    600 ml    Exam  General:  no distress  HEENT: NCAT, mucous membranes moist.  Cardiovascular: S1 S2 auscultated, no murmurs, RRR  Respiratory: Clear to auscultation  Abdomen: Soft, nontender, nondistended, + bowel sounds    Data Review   Micro Results No results found for this or any previous visit (from the past 240 hour(s)).  Radiology Reports Ct Head Wo Contrast  01/19/2015   CLINICAL DATA:  Found in car unresponsive  EXAM: CT HEAD WITHOUT CONTRAST  TECHNIQUE: Contiguous axial images were obtained from the base of the skull through the vertex without intravenous contrast.  COMPARISON:  12/10/2014  FINDINGS: There is no intracranial hemorrhage, mass or evidence of acute infarction. Gray matter and white matter are normal. The  ventricles and basal cisterns appear unremarkable.  The bony structures are intact. The visible portions of the paranasal sinuses are clear.  IMPRESSION: Normal brain   Electronically Signed   By: Andreas Newport M.D.   On: 01/19/2015 23:40   Dg Chest Port 1 View  01/22/2015   CLINICAL DATA:  Common a overdose, respiratory failure  EXAM: PORTABLE CHEST - 1 VIEW  COMPARISON:  Portable chest x-ray of Jan 21, 2015  FINDINGS: There is been interval extubation of the trachea and esophagus. The lungs are reasonably well inflated. There is minimal subsegmental atelectasis at the bases. There is no pleural effusion. The cardiac silhouette is top-normal in size. The pulmonary vascularity is not engorged. The bony thorax is  unremarkable.  IMPRESSION: Interval extubation of the trachea and esophagus. Minimal persistent bibasilar subsegmental atelectasis.   Electronically Signed   By: David  Martinique M.D.   On: 01/22/2015 07:36   Portable Chest Xray  01/21/2015   CLINICAL DATA:  Respiratory failure .  EXAM: PORTABLE CHEST - 1 VIEW  COMPARISON:  01/19/2015.  FINDINGS: Endotracheal tube in stable position. Interim placement of NG tube, its tip is projected over the stomach. Mediastinum hilar structures are normal. Bibasilar atelectasis and/or infiltrates again noted. Small bilateral pleural effusions cannot be excluded. No pneumothorax.  IMPRESSION: 1. Interim placement of NG tube, its tip is below the left hemidiaphragm. Endotracheal tube in stable position . 2. Stable bibasilar atelectasis and/or infiltrates .   Electronically Signed   By: Marcello Moores  Register   On: 01/21/2015 07:25   Dg Chest Portable 1 View  01/19/2015   CLINICAL DATA:  Overdose, unresponsive  EXAM: PORTABLE CHEST - 1 VIEW  COMPARISON:  11/25/2014  FINDINGS: Endotracheal tube tip is 3.8 cm above the carina. There is minimal curvilinear atelectasis above the slightly elevated right hemidiaphragm. The lungs are otherwise clear. There is no large effusion.  Hilar, mediastinal and cardiac contours appear unremarkable.  IMPRESSION: Satisfactory ET tube position. Minimal linear atelectatic appearing opacities in the right base.   Electronically Signed   By: Andreas Newport M.D.   On: 01/19/2015 23:27    CBC  Recent Labs Lab 01/29/15 0427  WBC 8.3  HGB 13.2  HCT 39.9  PLT 416*  MCV 94.1  MCH 31.1  MCHC 33.1  RDW 12.6    Chemistries   Recent Labs Lab 01/29/15 0427 02/02/15 1518  NA 137 138  K 4.5 4.1  CL 97* 99*  CO2 25 28  GLUCOSE 81 139*  BUN 15 9  CREATININE 0.66 0.72  CALCIUM 9.8 9.8   ------------------------------------------------------------------------------------------------------------------ estimated creatinine clearance is 75.1 mL/min (by C-G formula based on Cr of 0.72). ------------------------------------------------------------------------------------------------------------------ No results for input(s): HGBA1C in the last 72 hours. ------------------------------------------------------------------------------------------------------------------ No results for input(s): CHOL, HDL, LDLCALC, TRIG, CHOLHDL, LDLDIRECT in the last 72 hours. ------------------------------------------------------------------------------------------------------------------ No results for input(s): TSH, T4TOTAL, T3FREE, THYROIDAB in the last 72 hours.  Invalid input(s): FREET3 ------------------------------------------------------------------------------------------------------------------ No results for input(s): VITAMINB12, FOLATE, FERRITIN, TIBC, IRON, RETICCTPCT in the last 72 hours.  Coagulation profile No results for input(s): INR, PROTIME in the last 168 hours.  No results for input(s): DDIMER in the last 72 hours.  Cardiac Enzymes No results for input(s): CKMB, TROPONINI, MYOGLOBIN in the last 168 hours.  Invalid input(s):  CK ------------------------------------------------------------------------------------------------------------------ Invalid input(s): POCBNP    [name] A MD. on 02/03/2015 at 2:34 PM [phone].   After 7pm go to [email or web address] - password [email or web address]  And look for the night coverage person covering for me after hours  [facility] Office  [phone]

## 2015-02-03 NOTE — Consult Note (Signed)
Psychiatry Consult Follow up  Reason for Consult:  Depression, suicide attempt by overdose Referring Physician:  Dr. Ree Kida Patient Identification: [name] MRN:  [id number] Principal Diagnosis: Major depressive disorder-recurrent severe, without psychosis Diagnosis:   Patient Active Problem List   Diagnosis Date Noted  . Suicide attempt [T14.91]   . Amitriptyline overdose [T43.011A]   . Respiratory acidosis [E87.2]   . Prolonged Q-T interval on ECG [I45.81]   . Alcohol abuse [F10.10]   . Depression [F32.9]   . Anxiety state [F41.1]   . Bipolar I disorder, most recent episode (or current) unspecified [F31.9]   . Respiratory failure [J96.90]   . Seizure-like activity [R56.9] 12/11/2014  . Suicide attempt by hanging [T71.162A]   . MDD (major depressive disorder), recurrent severe, without psychosis [F33.2] 11/29/2014  . Panic disorder [F41.0] 11/29/2014  . Agoraphobia [F40.00] 11/29/2014  . ARF (acute renal failure) [N17.9] 11/25/2014  . Intentional acetaminophen overdose [T39.1X2A] 10/26/2014  . Major depressive disorder, recurrent, severe without psychotic features [F33.2]   . Insomnia [G47.00]   . Chronic pain syndrome [G89.4]   . Bipolar 1 disorder, mixed, severe [F31.63] 10/09/2014  . Acute blood loss anemia [D62] 10/07/2014  . Liver failure, acute [K72.00]   . GI bleed [K92.2] 10/06/2014  . Duodenal ulcer hemorrhage [K26.4] 10/06/2014  . Vomiting [R11.10] 10/05/2014  . Acute hepatitis [K72.00] 10/05/2014  . Acute renal insufficiency [N28.9] 10/05/2014  . Seizure disorder [G40.909] 10/05/2014  . Hyperammonemia [E72.20] 10/05/2014  . Suicidal ideation [R45.851] 10/05/2014  . Bipolar disorder [F31.9] 10/01/2014  . Major depression [F32.2] 10/01/2014  . Nausea and vomiting [R11.2] 09/30/2014  . Diarrhea [R19.7] 09/30/2014  . SIRS (systemic inflammatory response syndrome) [A41.9] 07/15/2014  . Tylenol overdose [T39.1X4A] 07/13/2014  . Hypokalemia [E87.6] 07/13/2014     Total Time spent with patient: 30 minutes  Subjective:   [name] is a 49 y.o. female admitted for depression and acetaminophen overdose.   Interval History:  Patient has complaints of stomach cramps, diarrhea and stool in blood. She also complained about frustrated being in bed which is causing back pain and leg pain due to walking on hard wood floor. She has history of stress fracture on her left foot. She has been walking about six rounds on the floor with sitter and also found herself facing on the floor when not able to sleep. She stated that she requested staff RN to contact social service to speak with her sister who does not take her answers that she needs to stay in long term psych hospitalization. She feels overwhelmed and stressed out with life. Patient is endorses recurrent suicidal thoughts, hopelessness, helplessness, worthlessness, lack of motivation, anhedonia and difficulty sleeping. Reportedly she is feeling frustrated and stressful. Patient says "I can't give guarantee that I am going to be safe if discharged from the hospital. Patient had multiple acute psychiatric hospitalization secondary to multiple suicidal attempts with the Tylenol overdose since October 2015 including recent tylenol overdose on this admission. Patient complaints being scared, unhappy, poor psychosocial situation and wishes to apply for disability benefits. Patient has no appetite suicidal behaviors or gestures during this weekend. Patient is calm and cooperative with the staff members including Air cabin crew.  She stepped down to the Neuroscience unit from intensive care unit. Patient does not want to be pushed out of the hospital because of multiple suicidal attempts since 2007 and reportedly she does not ask for help does not talk to anybody before she makes the suicidal  Attempts. She reports long  history of alcohol dependence until 11/28/2010 when she quit after a lot of help. Patient was first diagnosed with  clinical depression in Nov 27, 1992 after her father died. She has had multiple suicide attempts since 2005-11-27 and as a result has been hospitalized in various psychiatric inpatient hospitals. Patient denies history of manic episode, delusions or psychosis. She felt Prozac and ECT received at [facility] was helpful but not all other medications. She agreed to inpatient admission and medication therapy.  Past Medical History:  Past Medical History  Diagnosis Date  . Migraines   . Endometriosis   . ETOH abuse     sober 3 1/2 years  . Anorexia   . Depression   . Anxiety   . DJD (degenerative joint disease) of cervical spine   . Psychiatric pseudoseizure   . PICC (peripherally inserted central catheter) in place     rt neck  . Seizures     Past Surgical History  Procedure Laterality Date  . Knee surgery    . Abdominal surgery    . Cholecystectomy    . Appendectomy    . Abdominal hysterectomy    . Nasal sinus surgery    . Laproscopy    . Carpal tunnel release      11/27/2008  . Esophagogastroduodenoscopy N/A 10/06/2014    Procedure: ESOPHAGOGASTRODUODENOSCOPY (EGD);  Surgeon: Inda Castle, MD;  Location: [place];  Service: Endoscopy;  Laterality: N/A;   Family History:  Family History  Problem Relation Age of Onset  . Hypertension Mother   . Hyperlipidemia Mother   . Heart failure Father   . Cancer Other    Social History:  History  Alcohol Use No     History  Drug Use No    History   Social History  . Marital Status: Single    Spouse Name: N/A  . Number of Children: N/A  . Years of Education: N/A   Social History Main Topics  . Smoking status: Current Every Day Smoker -- 0.25 packs/day for 20 years    Types: Cigarettes  . Smokeless tobacco: Never Used  . Alcohol Use: No  . Drug Use: No  . Sexual Activity: Not on file   Other Topics Concern  . None   Social History Narrative   Additional Social History: Patient lives by herself in a condo and has 2  sisters and her relationships were strained. She has no history of substance abuse.      Allergies:   Allergies  Allergen Reactions  . Aspirin Anaphylaxis  . Doxycycline Anaphylaxis and Other (See Comments)    Joint damage also  . Imitrex [Sumatriptan Base] Other (See Comments)    Severe hypertension  . Iodinated Diagnostic Agents Anaphylaxis  . Lidocaine Anaphylaxis    Pt states she does well with Marcaine/Bupivicaine without problem  . Prednisone Other (See Comments)    She goes crazy  . Sulfa Antibiotics Anaphylaxis  . Sulfasalazine Anaphylaxis  . Sumatriptan Other (See Comments)    Felt like she was "having a stroke", heart rate and blood pressure "went through the roof"  . Tramadol Other (See Comments)    seizures  . Levofloxacin Other (See Comments)    Joints hurt  . Latex Rash  . Levofloxacin Rash and Other (See Comments)    Joints hurt  . Metrizamide Other (See Comments)  . Nsaids Rash and Other (See Comments)    GI bleed  . Tolmetin Rash    Labs:  Results for  orders placed or performed during the hospital encounter of 01/19/15 (from the past 48 hour(s))  Basic metabolic panel     Status: Abnormal   Collection Time: 02/02/15  3:18 PM  Result Value Ref Range   Sodium 138 135 - 145 mmol/L   Potassium 4.1 3.5 - 5.1 mmol/L   Chloride 99 (L) 101 - 111 mmol/L   CO2 28 22 - 32 mmol/L   Glucose, Bld 139 (H) 65 - 99 mg/dL   BUN 9 6 - 20 mg/dL   Creatinine, Ser 0.72 0.44 - 1.00 mg/dL   Calcium 9.8 8.9 - 10.3 mg/dL   GFR calc non Af Amer >60 >60 mL/min   GFR calc Af Amer >60 >60 mL/min    Comment: (NOTE) The eGFR has been calculated using the CKD EPI equation. This calculation has not been validated in all clinical situations. eGFR's persistently <60 mL/min signify possible Chronic Kidney Disease.    Anion gap 11 5 - 15    Vitals: Blood pressure 112/59, pulse 79, temperature 98.1 F (36.7 C), temperature source Oral, resp. rate 16, height $RemoveBe'5\' 2"'ZihgNlMMo$  (1.575 m),  weight 63 kg (138 lb 14.2 oz), SpO2 99 %.  Risk to Self: Is patient at risk for suicide?: Yes Risk to Others:   Prior Inpatient Therapy:   Prior Outpatient Therapy:    Current Facility-Administered Medications  Medication Dose Route Frequency Provider Last Rate Last Dose  . benzocaine (ORAJEL) 10 % mucosal gel   Mouth/Throat QID PRN Cristal Ford, DO      . butalbital-acetaminophen-caffeine (FIORICET, ESGIC) [phone] MG per tablet 1 tablet  1 tablet Oral Q6H PRN Cristal Ford, DO   1 tablet at 02/03/15 1202  . diphenhydrAMINE (BENADRYL) capsule 25 mg  25 mg Oral Once Dianne Dun, NP   25 mg at 01/26/15 2200  . feeding supplement (RESOURCE BREEZE) (RESOURCE BREEZE) liquid 1 Container  1 Container Oral Q24H Baird Lyons, RD   1 Container at 01/31/15 2020  . FLUoxetine (PROZAC) capsule 20 mg  20 mg Oral Daily Ambrose Finland, MD   20 mg at 19/41/74 0814  . folic acid (FOLVITE) tablet 1 mg  1 mg Oral Daily Donalynn Furlong [place], RPH   1 mg at 02/03/15 1015  . gabapentin (NEURONTIN) capsule 300 mg  300 mg Oral TID Ambrose Finland, MD   300 mg at 02/03/15 1015  . methocarbamol (ROBAXIN) tablet 500 mg  500 mg Oral Q8H PRN Belkys A Regalado, MD   500 mg at 02/03/15 1015  . ondansetron (ZOFRAN-ODT) disintegrating tablet 8 mg  8 mg Oral Q8H PRN Maryann Mikhail, DO   8 mg at 02/02/15 1149  . oxyCODONE (Oxy IR/ROXICODONE) immediate release tablet 5 mg  5 mg Oral Q4H PRN Wilhelmina Mcardle, MD   5 mg at 02/03/15 0820  . pantoprazole (PROTONIX) EC tablet 40 mg  40 mg Oral Q1200 Brand Males, MD   40 mg at 02/03/15 1202  . phenol (CHLORASEPTIC) mouth spray 1 spray  1 spray Mouth/Throat PRN Raylene Miyamoto, MD   1 spray at 01/22/15 2140  . polyethylene glycol (MIRALAX / GLYCOLAX) packet 17 g  17 g Oral Daily Maryann Mikhail, DO   17 g at 01/31/15 1002  . thiamine (VITAMIN B-1) tablet 100 mg  100 mg Oral Daily Donalynn Furlong [place], RPH   100 mg at 02/03/15 1015     Musculoskeletal: Strength & Muscle Tone: decreased Gait & Station: unable to stand Patient leans: N/A  Psychiatric Specialty Exam: Physical Exam:   ROS:   Blood pressure 112/59, pulse 79, temperature 98.1 F (36.7 C), temperature source Oral, resp. rate 16, height $RemoveBe'5\' 2"'zkULPiUPj$  (1.575 m), weight 63 kg (138 lb 14.2 oz), SpO2 99 %.Body mass index is 25.4 kg/(m^2).  General Appearance: Casual and dressed in hospital gown  Eye Contact::  Fair  Speech:  Clear and Coherent and Slow  Volume:  Decreased  Mood:  Depressed, Hopeless and Worthless  Affect:  Depressed and Tearful  Thought Process:  Coherent and Goal Directed  Orientation:  Full (Time, Place, and Person)  Thought Content:  Rumination  Suicidal Thoughts:  Yes.  with intent/plan  Homicidal Thoughts:  No  Memory:  Immediate;   Fair Recent;   Poor  Judgement:  Impaired  Insight:  Fair  Psychomotor Activity:  Decreased  Concentration:  Fair  Recall:  [employer] of Knowledge:Fair  Language: Good  Akathisia:  Negative  Handed:  Right  AIMS (if indicated):     Assets:  Communication Skills Desire for Improvement Leisure Time Social Support  ADL's:  Intact  Cognition: WNL  Sleep:   poor   Medical Decision Making: Review of Psycho-Social Stressors (1), Review or order clinical lab tests (1), Review and summation of old records (2), Established Problem, Worsening (2), Review of Last Therapy Session (1), Review or order medicine tests (1), Review of Medication Regimen & Side Effects (2) and Review of New Medication or Change in Dosage (2)  Treatment Plan Summary: Daily contact with patient to assess and evaluate symptoms and progress in treatment and Medication management  Plan:  Patient has depression, anxiety, low self-esteem, hopelessness, helplessness and overwhelming  and suicidal ideation. She has recent status post Tylenol overdose on admission to ICU. Patient cannot contract for safety at this time and long term state  psychiatric facility for further stabilization.   Suicidal ideation and status post acetaminophen overdose:  Continue one to one safety sitter  Depression: Continue  Fluoxetine 20 mg daily for depression   Generalized anxiety:  Continue Gabapentin 300 mg TID for anxiety  Insomnia:  Start benadryl 50 mg qhs  Recommend psychiatric Inpatient admission when medically cleared. Supportive therapy provided about ongoing stressors.  Dis[facility]: [facility] as patient is refused by local hospitals and currently on their waiting list as per psychiatric social service.   Durward Parcel., MD 02/03/2015 12:14 PM

## 2015-02-04 DIAGNOSIS — R109 Unspecified abdominal pain: Secondary | ICD-10-CM | POA: Insufficient documentation

## 2015-02-04 LAB — CBC
HEMATOCRIT: 35.8 % — AB (ref 36.0–46.0)
Hemoglobin: 11.5 g/dL — ABNORMAL LOW (ref 12.0–15.0)
MCH: 30.7 pg (ref 26.0–34.0)
MCHC: 32.1 g/dL (ref 30.0–36.0)
MCV: 95.7 fL (ref 78.0–100.0)
Platelets: 471 10*3/uL — ABNORMAL HIGH (ref 150–400)
RBC: 3.74 MIL/uL — AB (ref 3.87–5.11)
RDW: 13 % (ref 11.5–15.5)
WBC: 7.3 10*3/uL (ref 4.0–10.5)

## 2015-02-04 LAB — BASIC METABOLIC PANEL
ANION GAP: 10 (ref 5–15)
BUN: 7 mg/dL (ref 6–20)
CHLORIDE: 102 mmol/L (ref 101–111)
CO2: 28 mmol/L (ref 22–32)
CREATININE: 0.77 mg/dL (ref 0.44–1.00)
Calcium: 9.3 mg/dL (ref 8.9–10.3)
GFR calc non Af Amer: >60 mL/min (ref >60)
Glucose, Bld: 88 mg/dL (ref 65–99)
Potassium: 3.8 mmol/L (ref 3.5–5.1)
SODIUM: 140 mmol/L (ref 135–145)

## 2015-02-04 MED ORDER — ONDANSETRON 4 MG PO TBDP
4.0000 mg | ORAL_TABLET | Freq: Three times a day (TID) | ORAL | Status: DC | PRN
  Administered 2015-02-05 (×2): 4 mg via ORAL
  Filled 2015-02-04 (×2): qty 1

## 2015-02-04 MED ORDER — DICYCLOMINE HCL 10 MG PO CAPS
10.0000 mg | ORAL_CAPSULE | Freq: Two times a day (BID) | ORAL | Status: DC | PRN
  Administered 2015-02-04 – 2015-02-05 (×3): 10 mg via ORAL
  Filled 2015-02-04 (×4): qty 1

## 2015-02-04 NOTE — Progress Notes (Signed)
[facility]                                                                              Patient Demographics  [name], is a 49 y.o. female, DOB - 05-28-1966, MWN:[id number]   HPI on 01/20/2015 by Dr. Montey Hora and Dr. Kara Mead [name] is a 49 y.o. F with PMH as outlined below. She was brought to [facility] ED 5/1 by EMS after a bystander saw her slumped over in her car in a nearby parking lot. [facility] first responded and found her unresponsive with ? Convulsions. [facility] found a brown paper bag with aluminum foil and a spoon in her car. No street drugs were found. She was brought to ED where she remained unresponsive to sternal rub or ammonia inhalants. She was intubated for airway protection and PCCM was called for admission.  Of note, she has hx of admission for intentional tylenol overdose as part of suicide attempt. Last admission here for this was in 2015 where tylenol level was 492. On this admission, tylenol level was 140. UDS is pending.  She was seen in consultation by neurology. She apparently was just discharged from [place] facility earlier this month and while there, she had a an EEG performed for concern of seizure vs pseudoseizures. EEG captured on of her typical events and demonstrated that it was not epileptic in nature. CT head was negative and neuro recommended obtaining ammonia and depakote levels.  Interim history Psychiatry consulted, pending placement for inpatient psych.  Pateint has had multiple admissions to psych units.    Assessment & Plan   Suicide attempt -Continue patient on suicide precaution -Patient is medically stable -Pending inpatient psych.  Per SW note: patient would be a good candidate for care coordination due to 8 admissions in 6 months.   -medications per psych. Waiting placement  Abdominal pain, diarrhea; Possible blood in the stool.  Continue with  IV protonix.  Hb at 11,  Few days ago was 12--13. 2 weeks ago at  70.  CT abdomen unrevealing.  GI pathogen pending. .   Acetaminophen overdose - with hx of intentional OD as part of suicide attempt -Multi medication overdose to include Tylenol, amitriptyline, and Soma.  -Patient's liver enzymes within normal limits currently  -Patient received N-acetylcysteine with loading dose following by maintenance infusion.Pharmacy to monitor NAC. -Acetaminophen level<10 on 5/3. -Tylenol level less than 10 on 5-11  Neck pain, back pain: Suspect MSK.  Started Robaxin PRN>   Acute respiratory failure following acetaminophen/amitriptyline/soma overdose -Resolved  Respiratory acidosis -Resolved  Tobacco use disorder -Tobacco cessation counseling.  Prolonged QTc on admission  -Most likely secondary to amitriptyline overdose, resolved on subsequent EKG  Hypokalemia -Resolved, continue to monitor BMP and replace as needed   Hx psychiatric pseudoseizure, ETOH abuse, depression, anxiety -UDS positive for opiates and benzodiazepines which were expected. -Psychiatry consulted and recommended Fluoxetine 10 mg daily, gabapentin 300mg  TID for anxiety, and discontinued benzodiazepines  Bipolar disorder  -Per psychiatry -Per patient, she does not have a history of bipolar disorder  Code Status: Full  Family Communication: None at bedside.  Disposition Plan: Admitted.  Pending inpatient psychiatry placement.   Currently  on waiting list.   Time Spent in minutes   15 minutes  Procedures/Significant  Events CXR 5/1 >>> no acute process CT head 5/1 >>> no acute process 5/1 - brought to ED after being found unresponsive. Admitted with tylenol overdose. 01/21/15: Extubated by eMD  Consults   Dr. Ambrose Finland (psychiatry)  DVT Prophylaxis  Patient ambulatory   Lab Results  Component Value Date   PLT 471* 02/04/2015    Medications  Scheduled Meds: . diphenhydrAMINE  25 mg Oral Once  . feeding supplement (RESOURCE BREEZE)  1 Container  Oral Q24H  . FLUoxetine  20 mg Oral Daily  . folic acid  1 mg Oral Daily  . gabapentin  300 mg Oral TID  . pantoprazole (PROTONIX) IV  40 mg Intravenous Q12H  . thiamine  100 mg Oral Daily   Continuous Infusions: . sodium chloride 100 mL/hr at 02/04/15 0755   PRN Meds:.alum & mag hydroxide-simeth, benzocaine, butalbital-acetaminophen-caffeine, diphenhydrAMINE, methocarbamol, ondansetron, oxyCODONE, phenol, sodium chloride  Antibiotics    Anti-infectives    None      Subjective:  Diarrhea better. Still with cramping abdominal pain; no more blood in the stool   Objective:   Filed Vitals:   02/03/15 2132 02/04/15 0245 02/04/15 0608 02/04/15 1000  BP: 125/65 89/46 114/69 149/69  Pulse: 91 63 72 83  Temp: 98.4 F (36.9 C) 97.9 F (36.6 C) 98.2 F (36.8 C) 97.7 F (36.5 C)  TempSrc: Oral Oral Oral Axillary  Resp: 18 18 18 18   Height:      Weight:   61.326 kg (135 lb 3.2 oz)   SpO2: 99% 97% 97% 100%    Wt Readings from Last 3 Encounters:  02/04/15 61.326 kg (135 lb 3.2 oz)  12/15/14 62.3 kg (137 lb 5.6 oz)  10/27/14 54.9 kg (121 lb 0.5 oz)     Intake/Output Summary (Last 24 hours) at 02/04/15 1115 Last data filed at 02/03/15 1200  Gross per 24 hour  Intake    240 ml  Output      0 ml  Net    240 ml    Exam  General:  no distress  HEENT: NCAT, mucous membranes moist.  Cardiovascular: S1 S2 auscultated, no murmurs, RRR  Respiratory: Clear to auscultation  Abdomen: Soft, mild tendernss, nondistended, + bowel sounds    Data Review   Micro Results No results found for this or any previous visit (from the past 240 hour(s)).  Radiology Reports Ct Abdomen Pelvis Wo Contrast  02/03/2015   CLINICAL DATA:  Abdominal pain, nausea, vomiting, diarrhea, question blood in stool, history of alcohol abuse, duodenal ulcer, endometriosis,, overdose  EXAM: CT ABDOMEN AND PELVIS WITHOUT CONTRAST  TECHNIQUE: Multidetector CT imaging of the abdomen and pelvis was  performed following the standard protocol without IV contrast. Sagittal and coronal MPR images reconstructed from axial data set. Patient drank dilute oral contrast for exam.  COMPARISON:  10/05/2014  FINDINGS: Lung bases clear.  Gallbladder surgically absent.  Abnormal axis of RIGHT kidney unchanged.  Within limits of a nonenhanced exam, liver, spleen, pancreas, kidneys, and adrenal glands otherwise normal appearance.  Distended urinary bladder without focal abnormality.  Uterus and appendix surgically absent with nonvisualization of ovaries.  Distal colon unopacified and under-distended unable to exclude wall thickening.  Stomach and remaining bowel loops normal appearance.  No mass, adenopathy, free fluid or free air.  Osseous structures unremarkable.  IMPRESSION: No definite acute intra-abdominal or intrapelvic abnormalities.  Suboptimal assessment of rectosigmoid colon  due to under-distended and unopacified state.   Electronically Signed   By: Lavonia Dana M.D.   On: 02/03/2015 20:23   Ct Head Wo Contrast  01/19/2015   CLINICAL DATA:  Found in car unresponsive  EXAM: CT HEAD WITHOUT CONTRAST  TECHNIQUE: Contiguous axial images were obtained from the base of the skull through the vertex without intravenous contrast.  COMPARISON:  12/10/2014  FINDINGS: There is no intracranial hemorrhage, mass or evidence of acute infarction. Gray matter and white matter are normal. The ventricles and basal cisterns appear unremarkable.  The bony structures are intact. The visible portions of the paranasal sinuses are clear.  IMPRESSION: Normal brain   Electronically Signed   By: Andreas Newport M.D.   On: 01/19/2015 23:40   Dg Chest Port 1 View  01/22/2015   CLINICAL DATA:  Common a overdose, respiratory failure  EXAM: PORTABLE CHEST - 1 VIEW  COMPARISON:  Portable chest x-ray of Jan 21, 2015  FINDINGS: There is been interval extubation of the trachea and esophagus. The lungs are reasonably well inflated. There is minimal  subsegmental atelectasis at the bases. There is no pleural effusion. The cardiac silhouette is top-normal in size. The pulmonary vascularity is not engorged. The bony thorax is unremarkable.  IMPRESSION: Interval extubation of the trachea and esophagus. Minimal persistent bibasilar subsegmental atelectasis.   Electronically Signed   By: David  Martinique M.D.   On: 01/22/2015 07:36   Portable Chest Xray  01/21/2015   CLINICAL DATA:  Respiratory failure .  EXAM: PORTABLE CHEST - 1 VIEW  COMPARISON:  01/19/2015.  FINDINGS: Endotracheal tube in stable position. Interim placement of NG tube, its tip is projected over the stomach. Mediastinum hilar structures are normal. Bibasilar atelectasis and/or infiltrates again noted. Small bilateral pleural effusions cannot be excluded. No pneumothorax.  IMPRESSION: 1. Interim placement of NG tube, its tip is below the left hemidiaphragm. Endotracheal tube in stable position . 2. Stable bibasilar atelectasis and/or infiltrates .   Electronically Signed   By: Marcello Moores  Register   On: 01/21/2015 07:25   Dg Chest Portable 1 View  01/19/2015   CLINICAL DATA:  Overdose, unresponsive  EXAM: PORTABLE CHEST - 1 VIEW  COMPARISON:  11/25/2014  FINDINGS: Endotracheal tube tip is 3.8 cm above the carina. There is minimal curvilinear atelectasis above the slightly elevated right hemidiaphragm. The lungs are otherwise clear. There is no large effusion. Hilar, mediastinal and cardiac contours appear unremarkable.  IMPRESSION: Satisfactory ET tube position. Minimal linear atelectatic appearing opacities in the right base.   Electronically Signed   By: Andreas Newport M.D.   On: 01/19/2015 23:27    CBC  Recent Labs Lab 01/29/15 0427 02/03/15 1517 02/03/15 2116 02/04/15 0430  WBC 8.3 7.8 8.7 7.3  HGB 13.2 12.4 11.8* 11.5*  HCT 39.9 37.4 36.8 35.8*  PLT 416* 562* 535* 471*  MCV 94.1 94.0 95.6 95.7  MCH 31.1 31.2 30.6 30.7  MCHC 33.1 33.2 32.1 32.1  RDW 12.6 12.8 12.8 13.0     Chemistries   Recent Labs Lab 01/29/15 0427 02/02/15 1518 02/03/15 1517 02/04/15 0430  NA 137 138 138 140  K 4.5 4.1 3.9 3.8  CL 97* 99* 99* 102  CO2 25 28 28 28   GLUCOSE 81 139* 86 88  BUN 15 9 9 7   CREATININE 0.66 0.72 0.66 0.77  CALCIUM 9.8 9.8 9.5 9.3   ------------------------------------------------------------------------------------------------------------------ estimated creatinine clearance is 74.1 mL/min (by C-G formula based on Cr of 0.77). ------------------------------------------------------------------------------------------------------------------ No  results for input(s): HGBA1C in the last 72 hours. ------------------------------------------------------------------------------------------------------------------ No results for input(s): CHOL, HDL, LDLCALC, TRIG, CHOLHDL, LDLDIRECT in the last 72 hours. ------------------------------------------------------------------------------------------------------------------ No results for input(s): TSH, T4TOTAL, T3FREE, THYROIDAB in the last 72 hours.  Invalid input(s): FREET3 ------------------------------------------------------------------------------------------------------------------ No results for input(s): VITAMINB12, FOLATE, FERRITIN, TIBC, IRON, RETICCTPCT in the last 72 hours.  Coagulation profile No results for input(s): INR, PROTIME in the last 168 hours.  No results for input(s): DDIMER in the last 72 hours.  Cardiac Enzymes No results for input(s): CKMB, TROPONINI, MYOGLOBIN in the last 168 hours.  Invalid input(s): CK ------------------------------------------------------------------------------------------------------------------ Invalid input(s): POCBNP    Simon Llamas A MD. on 02/04/2015 at 11:15 AM [phone].   After 7pm go to [email or web address] - password [email or web address]  And look for the night coverage person covering for me after hours  [facility] Office  [phone]

## 2015-02-04 NOTE — Progress Notes (Signed)
Nutrition Follow-up  DOCUMENTATION CODES:  Not applicable  INTERVENTION:  Boost Breeze  Snacks  NUTRITION DIAGNOSIS:  Inadequate oral intake related to other (see comment) (depression and social situation) as evidenced by meal completion < 50%.  Ongoing  GOAL:  Patient will meet greater than or equal to 90% of their needs  Not being met  MONITOR:  PO intake, Supplement acceptance  REASON FOR ASSESSMENT:  Ventilator; extubation follow-up    ASSESSMENT: 49 y.o. female who was recently treated with intentional tylenol overdose with significant psychiatric history who was found in a car unresponsive in the [facility] parking lot.  Pt was put on a clear liquid diet yesterday. Pt states she has had abdominal pain for the past couple days, has been eating less and skipping some meals. Reports tolerating bland foods and liquids. She has been drinking one [employer] mixed with ginger ale daily; she is not interested in any other supplements/invterventions at this time.  Labs: low hemoglobin  Height:  Ht Readings from Last 1 Encounters:  02/01/15 _0  (1.575 m)    Weight:  Wt Readings from Last 1 Encounters:  02/04/15 135 lb 3.2 oz (61.326 kg)   01/29/15 138 lb 4 oz (62.71 kg)       01/22/15 134 lb 11.2 oz (61.1 kg)            Ideal Body Weight:  47.7 kg  Wt Readings from Last 10 Encounters:  02/04/15 135 lb 3.2 oz (61.326 kg)  12/15/14 137 lb 5.6 oz (62.3 kg)  10/27/14 121 lb 0.5 oz (54.9 kg)  10/09/14 118 lb (53.524 kg)  10/01/14 125 lb 4.8 oz (56.836 kg)  07/15/14 139 lb 11.2 oz (63.368 kg)  03/20/14 136 lb 3.2 oz (61.78 kg)  11/04/13 130 lb (58.968 kg)  05/18/12 151 lb (68.493 kg)    BMI:  Body mass index is 24.72 kg/(m^2).  Estimated Nutritional Needs:  Kcal:  1500-1700  Protein:  75-85 grams  Fluid:  > 1.5 L/day  Skin:  Reviewed, no issues  Diet Order:  Diet clear liquid Room service appropriate?: Yes; Fluid consistency::  Thin  EDUCATION NEEDS:  No education needs identified at this time  No intake or output data in the 24 hours ending 02/04/15 1305  Last BM:  5/16  Pryor Ochoa RD, LDN Inpatient Clinical Dietitian Pager: [phone] After Hours Pager: [phone]

## 2015-02-04 NOTE — Progress Notes (Signed)
Patient ID: [name], female   DOB: 1966/08/11, 49 y.o.   MRN: [id number]  Case discussed with Nonnie Done, LCSW and Wells Guiles, [facility].  Patient has been followed by psych social service, who stated that she has PICC line now and patient has been in waiting list of [place]. Informed to Wells Guiles, [facility] regarding patient request to see new psychiatric consultant if possible.   Dr. Darleene Cleaver may be able to follow up with her soon, who has contact with her about a week ago on weekend consultation and at that time of consultation recommended in patient psych treatment for making acetaminophen overdose as a suicide attempt when medically stable.    [name],Hailey R. 02/04/2015 9:50 AM

## 2015-02-05 DIAGNOSIS — R1013 Epigastric pain: Secondary | ICD-10-CM

## 2015-02-05 LAB — CBC
HCT: 33.6 % — ABNORMAL LOW (ref 36.0–46.0)
Hemoglobin: 11.1 g/dL — ABNORMAL LOW (ref 12.0–15.0)
MCH: 31.2 pg (ref 26.0–34.0)
MCHC: 33 g/dL (ref 30.0–36.0)
MCV: 94.4 fL (ref 78.0–100.0)
PLATELETS: 439 10*3/uL — AB (ref 150–400)
RBC: 3.56 MIL/uL — AB (ref 3.87–5.11)
RDW: 12.7 % (ref 11.5–15.5)
WBC: 7 10*3/uL (ref 4.0–10.5)

## 2015-02-05 LAB — LIPASE, BLOOD: Lipase: 20 U/L — ABNORMAL LOW (ref 22–51)

## 2015-02-05 MED ORDER — SUCRALFATE 1 GM/10ML PO SUSP
1.0000 g | Freq: Three times a day (TID) | ORAL | Status: DC
  Administered 2015-02-05 – 2015-02-06 (×6): 1 g via ORAL
  Filled 2015-02-05 (×6): qty 10

## 2015-02-05 MED ORDER — METHOCARBAMOL 500 MG PO TABS
500.0000 mg | ORAL_TABLET | Freq: Three times a day (TID) | ORAL | Status: DC | PRN
Start: 2015-02-05 — End: 2015-02-06
  Administered 2015-02-05 – 2015-02-06 (×3): 500 mg via ORAL
  Filled 2015-02-05 (×3): qty 1

## 2015-02-05 NOTE — Progress Notes (Signed)
Patient Demographics  [name], is a 49 y.o. female, DOB - 07/12/1966, UXL:[id number]   HPI on 01/20/2015 by Dr. Montey Hora and Dr. Kara Mead [name] is a 49 y.o. F with PMH as outlined below. She was brought to [facility] ED 5/1 by EMS after a bystander saw her slumped over in her car in a nearby parking lot. [facility] first responded and found her unresponsive with ? Convulsions. [facility] found a brown paper bag with aluminum foil and a spoon in her car. No street drugs were found. She was brought to ED where she remained unresponsive to sternal rub or ammonia inhalants. She was intubated for airway protection and PCCM was called for admission.  Of note, she has hx of admission for intentional tylenol overdose as part of suicide attempt. Last admission here for this was in 2015 where tylenol level was 492. On this admission, tylenol level was 140. UDS is pending.  She was seen in consultation by neurology. She apparently was just discharged from [facility] facility earlier this month and while there, she had a an EEG performed for concern of seizure vs pseudoseizures. EEG captured on of her typical events and demonstrated that it was not epileptic in nature. CT head was negative and neuro recommended obtaining ammonia and depakote levels.  Interim history Psychiatry consulted, pending placement for inpatient psych.  Pateint has had multiple admissions to psych units.    Assessment & Plan   Suicide attempt -Continue patient on suicide precaution -Patient is medically stable for d/c to psych unit -Pending inpatient psych.  Per SW note: patient would be a good candidate for care coordination due to 8 admissions in 6 months.   -medications per psych. Waiting placement  Abdominal pain, diarrhea; Possible blood in the stool.  Continue with  IV protonix BID- had ulcers on EGD in January Hb at 11,  Few  days ago was 12--13. 2 weeks ago at 49.  CT abdomen unrevealing.  GI pathogen pending -added carafate   Acetaminophen overdose - with hx of intentional OD as part of suicide attempt -Multi medication overdose to include Tylenol, amitriptyline, and Soma.  -Patient's liver enzymes within normal limits currently  -Patient received N-acetylcysteine with loading dose following by maintenance infusion.Pharmacy to monitor NAC. -Acetaminophen level<10 on 5/3. -Tylenol level less than 10 on 5-11  Neck pain, back pain: Suspect MSK.  Started Robaxin PRN  Acute respiratory failure following acetaminophen/amitriptyline/soma overdose -Resolved  Respiratory acidosis -Resolved  Tobacco use disorder -Tobacco cessation counseling.  Prolonged QTc on admission  -Most likely secondary to amitriptyline overdose, resolved on subsequent EKG  Hypokalemia -Resolved, continue to monitor BMP and replace as needed   Hx psychiatric pseudoseizure, ETOH abuse, depression, anxiety -UDS positive for opiates and benzodiazepines which were expected. -Psychiatry consulted and recommended Fluoxetine 10 mg daily, gabapentin 300mg  TID for anxiety, and discontinued benzodiazepines  Bipolar disorder  -Per psychiatry -Per patient, she does not have a history of bipolar disorder  Code Status: Full  Family Communication: None at bedside.  Disposition Plan:  Admitted.  Pending inpatient psychiatry placement.   Currently on waiting list.   Time Spent in minutes   25 minutes  Procedures/Significant  Events CXR 5/1 >>> no acute process CT head 5/1 >>> no acute process 5/1 - brought to ED after being found unresponsive. Admitted with tylenol overdose. 01/21/15: Extubated by MD  Consults   Dr. Ambrose Finland (psychiatry)  DVT Prophylaxis  Patient ambulatory   Lab Results  Component Value Date   PLT 439* 02/05/2015    Medications  Scheduled Meds: . diphenhydrAMINE  25 mg Oral Once  .  feeding supplement (RESOURCE BREEZE)  1 Container Oral Q24H  . FLUoxetine  20 mg Oral Daily  . folic acid  1 mg Oral Daily  . gabapentin  300 mg Oral TID  . pantoprazole (PROTONIX) IV  40 mg Intravenous Q12H  . sucralfate  1 g Oral TID WC & HS  . thiamine  100 mg Oral Daily   Continuous Infusions: . sodium chloride 100 mL/hr at 02/04/15 0755   PRN Meds:.alum & mag hydroxide-simeth, benzocaine, butalbital-acetaminophen-caffeine, diphenhydrAMINE, methocarbamol, ondansetron, oxyCODONE, phenol, sodium chloride  Antibiotics    Anti-infectives    None      Subjective:  Pain in epigastric region Wants diet advanced- thinks that would help pain  Objective:   Filed Vitals:   02/04/15 0608 02/04/15 1000 02/04/15 1450 02/05/15 0613  BP: 114/69 149/69 140/82 131/71  Pulse: 72 83 81 71  Temp: 98.2 F (36.8 C) 97.7 F (36.5 C) 98.4 F (36.9 C) 98.8 F (37.1 C)  TempSrc: Oral Axillary Axillary Oral  Resp: 18 18 18 18   Height:      Weight: 61.326 kg (135 lb 3.2 oz)     SpO2: 97% 100% 100% 100%    Wt Readings from Last 3 Encounters:  02/04/15 61.326 kg (135 lb 3.2 oz)  12/15/14 62.3 kg (137 lb 5.6 oz)  10/27/14 54.9 kg (121 lb 0.5 oz)    No intake or output data in the 24 hours ending 02/05/15 0956  Exam  General:  no distress  HEENT: NCAT, mucous membranes moist.  Cardiovascular: S1 S2 auscultated, no murmurs, RRR  Respiratory: Clear to auscultation  Abdomen: Soft, mild tendernss, nondistended, + bowel sounds    Data Review   Micro Results No results found for this or any previous visit (from the past 240 hour(s)).  Radiology Reports Ct Abdomen Pelvis Wo Contrast  02/03/2015   CLINICAL DATA:  Abdominal pain, nausea, vomiting, diarrhea, question blood in stool, history of alcohol abuse, duodenal ulcer, endometriosis,, overdose  EXAM: CT ABDOMEN AND PELVIS WITHOUT CONTRAST  TECHNIQUE: Multidetector CT imaging of the abdomen and pelvis was performed following the  standard protocol without IV contrast. Sagittal and coronal MPR images reconstructed from axial data set. Patient drank dilute oral contrast for exam.  COMPARISON:  10/05/2014  FINDINGS: Lung bases clear.  Gallbladder surgically absent.  Abnormal axis of RIGHT kidney unchanged.  Within limits of a nonenhanced exam, liver, spleen, pancreas, kidneys, and adrenal glands otherwise normal appearance.  Distended urinary bladder without focal abnormality.  Uterus and appendix surgically absent with nonvisualization of ovaries.  Distal colon unopacified and under-distended unable to exclude wall thickening.  Stomach and remaining bowel loops normal appearance.  No mass, adenopathy, free fluid or free air.  Osseous structures unremarkable.  IMPRESSION: No definite acute intra-abdominal or intrapelvic abnormalities.  Suboptimal assessment of rectosigmoid colon due to under-distended and unopacified state.   Electronically Signed   By: Elta Guadeloupe  Thornton Papas M.D.   On: 02/03/2015 20:23   Ct Head Wo Contrast  01/19/2015   CLINICAL DATA:  Found in car unresponsive  EXAM: CT HEAD WITHOUT CONTRAST  TECHNIQUE: Contiguous axial images were obtained from the base of the skull through the vertex without intravenous contrast.  COMPARISON:  12/10/2014  FINDINGS: There is no intracranial hemorrhage, mass or evidence of acute infarction. Gray matter and white matter are normal. The ventricles and basal cisterns appear unremarkable.  The bony structures are intact. The visible portions of the paranasal sinuses are clear.  IMPRESSION: Normal brain   Electronically Signed   By: Andreas Newport M.D.   On: 01/19/2015 23:40   Dg Chest Port 1 View  01/22/2015   CLINICAL DATA:  Common a overdose, respiratory failure  EXAM: PORTABLE CHEST - 1 VIEW  COMPARISON:  Portable chest x-ray of Jan 21, 2015  FINDINGS: There is been interval extubation of the trachea and esophagus. The lungs are reasonably well inflated. There is minimal subsegmental atelectasis  at the bases. There is no pleural effusion. The cardiac silhouette is top-normal in size. The pulmonary vascularity is not engorged. The bony thorax is unremarkable.  IMPRESSION: Interval extubation of the trachea and esophagus. Minimal persistent bibasilar subsegmental atelectasis.   Electronically Signed   By: David  Martinique M.D.   On: 01/22/2015 07:36   Portable Chest Xray  01/21/2015   CLINICAL DATA:  Respiratory failure .  EXAM: PORTABLE CHEST - 1 VIEW  COMPARISON:  01/19/2015.  FINDINGS: Endotracheal tube in stable position. Interim placement of NG tube, its tip is projected over the stomach. Mediastinum hilar structures are normal. Bibasilar atelectasis and/or infiltrates again noted. Small bilateral pleural effusions cannot be excluded. No pneumothorax.  IMPRESSION: 1. Interim placement of NG tube, its tip is below the left hemidiaphragm. Endotracheal tube in stable position . 2. Stable bibasilar atelectasis and/or infiltrates .   Electronically Signed   By: Marcello Moores  Register   On: 01/21/2015 07:25   Dg Chest Portable 1 View  01/19/2015   CLINICAL DATA:  Overdose, unresponsive  EXAM: PORTABLE CHEST - 1 VIEW  COMPARISON:  11/25/2014  FINDINGS: Endotracheal tube tip is 3.8 cm above the carina. There is minimal curvilinear atelectasis above the slightly elevated right hemidiaphragm. The lungs are otherwise clear. There is no large effusion. Hilar, mediastinal and cardiac contours appear unremarkable.  IMPRESSION: Satisfactory ET tube position. Minimal linear atelectatic appearing opacities in the right base.   Electronically Signed   By: Andreas Newport M.D.   On: 01/19/2015 23:27    CBC  Recent Labs Lab 02/03/15 1517 02/03/15 2116 02/04/15 0430 02/05/15 0415  WBC 7.8 8.7 7.3 7.0  HGB 12.4 11.8* 11.5* 11.1*  HCT 37.4 36.8 35.8* 33.6*  PLT 562* 535* 471* 439*  MCV 94.0 95.6 95.7 94.4  MCH 31.2 30.6 30.7 31.2  MCHC 33.2 32.1 32.1 33.0  RDW 12.8 12.8 13.0 12.7    Chemistries   Recent  Labs Lab 02/02/15 1518 02/03/15 1517 02/04/15 0430  NA 138 138 140  K 4.1 3.9 3.8  CL 99* 99* 102  CO2 28 28 28   GLUCOSE 139* 86 88  BUN 9 9 7   CREATININE 0.72 0.66 0.77  CALCIUM 9.8 9.5 9.3   ------------------------------------------------------------------------------------------------------------------ estimated creatinine clearance is 74.1 mL/min (by C-G formula based on Cr of 0.77). ------------------------------------------------------------------------------------------------------------------ No results for input(s): HGBA1C in the last 72 hours. ------------------------------------------------------------------------------------------------------------------ No results for input(s): CHOL, HDL, LDLCALC, TRIG, CHOLHDL, LDLDIRECT in the last 72 hours. ------------------------------------------------------------------------------------------------------------------  No results for input(s): TSH, T4TOTAL, T3FREE, THYROIDAB in the last 72 hours.  Invalid input(s): FREET3 ------------------------------------------------------------------------------------------------------------------ No results for input(s): VITAMINB12, FOLATE, FERRITIN, TIBC, IRON, RETICCTPCT in the last 72 hours.  Coagulation profile No results for input(s): INR, PROTIME in the last 168 hours.  No results for input(s): DDIMER in the last 72 hours.  Cardiac Enzymes No results for input(s): CKMB, TROPONINI, MYOGLOBIN in the last 168 hours.  Invalid input(s): CK ------------------------------------------------------------------------------------------------------------------ Invalid input(s): POCBNP    Mysha Peeler DO. on 02/05/2015 at 9:56 AM [phone]   After 7pm go to [email or web address] - password [email or web address]  And look for the night coverage person covering for me after hours  [facility] Office  [phone]

## 2015-02-06 LAB — GI PATHOGEN PANEL BY PCR, STOOL
C difficile toxin A/B: NOT DETECTED
Campylobacter by PCR: NOT DETECTED
Cryptosporidium by PCR: NOT DETECTED
E COLI (ETEC) LT/ST: NOT DETECTED
E COLI 0157 BY PCR: NOT DETECTED
E coli (STEC): NOT DETECTED
G LAMBLIA BY PCR: NOT DETECTED
Norovirus GI/GII: NOT DETECTED
Rotavirus A by PCR: NOT DETECTED
SALMONELLA BY PCR: NOT DETECTED
SHIGELLA BY PCR: NOT DETECTED

## 2015-02-06 MED ORDER — FLUOXETINE HCL 20 MG PO CAPS: 20.0000 mg | ORAL_CAPSULE | Freq: Every day | ORAL | Status: DC

## 2015-02-06 MED ORDER — ALUM & MAG HYDROXIDE-SIMETH 200-200-20 MG/5ML PO SUSP: 30.0000 mL | Freq: Four times a day (QID) | ORAL | Status: DC | PRN

## 2015-02-06 MED ORDER — ONDANSETRON 4 MG PO TBDP: 4.0000 mg | ORAL_TABLET | Freq: Three times a day (TID) | ORAL | Status: DC | PRN

## 2015-02-06 MED ORDER — SUCRALFATE 1 GM/10ML PO SUSP: 1.0000 g | Freq: Three times a day (TID) | ORAL | Status: DC

## 2015-02-06 NOTE — Progress Notes (Signed)
Spoke with Leveda Anna (on call SW) and facility still reviewing paperwork at this time. Per MD , not to d/c'ed PICC until placement is ready.    Ave Filter, RN

## 2015-02-06 NOTE — Clinical Social Work Note (Signed)
Patient has bed available at [facility].  Involuntary Commitment paperwork completed and additional clinicals faxed to [facility] for review.  Patient will be transported via [place].  ED CSW to assist with transfer.  [phone]

## 2015-02-06 NOTE — Progress Notes (Signed)
Patient Demographics  [name], is a 49 y.o. female, DOB - 10/21/1965, LDJ:[id number]   HPI on 01/20/2015 by Dr. Montey Hora and Dr. Kara Mead [name] is a 49 y.o. F with PMH as outlined below. She was brought to [facility] ED 5/1 by EMS after a bystander saw her slumped over in her car in a nearby parking lot. [facility] first responded and found her unresponsive with ? Convulsions. [facility] found a brown paper bag with aluminum foil and a spoon in her car. No street drugs were found. She was brought to ED where she remained unresponsive to sternal rub or ammonia inhalants. She was intubated for airway protection and PCCM was called for admission.  Of note, she has hx of admission for intentional tylenol overdose as part of suicide attempt. Last admission here for this was in 2015 where tylenol level was 492. On this admission, tylenol level was 140. UDS is pending.  She was seen in consultation by neurology. She apparently was just discharged from [facility] facility earlier this month and while there, she had a an EEG performed for concern of seizure vs pseudoseizures. EEG captured on of her typical events and demonstrated that it was not epileptic in nature. CT head was negative and neuro recommended obtaining ammonia and depakote levels.  Interim history Psychiatry consulted, pending placement for inpatient psych.  Pateint has had multiple admissions to psych units.    Assessment & Plan   Suicide attempt -Continue patient on suicide precaution -Patient is medically stable for d/c to psych unit -Pending inpatient psych.  Per SW note: patient would be a good candidate for care coordination due to 8 admissions in 6 months.   -medications per psych. Waiting placement  Abdominal pain, diarrhea; Possible blood in the stool.  Continue with  IV protonix BID- had ulcers on EGD in January Hb at 11,  Few  days ago was 12--13. 2 weeks ago at 26.  CT abdomen unrevealing.  GI pathogen pending -added carafate with much improvement  Acetaminophen overdose - with hx of intentional OD as part of suicide attempt -Multi medication overdose to include Tylenol, amitriptyline, and Soma.  -Patient's liver enzymes within normal limits currently  -Patient received N-acetylcysteine with loading dose following by maintenance infusion.Pharmacy to monitor NAC. -Acetaminophen level<10 on 5/3. -Tylenol level less than 10 on 5-11  Neck pain, back pain: Suspect MSK.  Started Robaxin PRN  Acute respiratory failure following acetaminophen/amitriptyline/soma overdose -Resolved  Respiratory acidosis -Resolved  Tobacco use disorder -Tobacco cessation counseling.  Prolonged QTc on admission  -Most likely secondary to amitriptyline overdose, resolved on subsequent EKG  Hypokalemia -Resolved, continue to monitor BMP and replace as needed   Hx psychiatric pseudoseizure, ETOH abuse, depression, anxiety -UDS positive for opiates and benzodiazepines which were expected. -Psychiatry consulted and recommended Fluoxetine 10 mg daily, gabapentin 300mg  TID for anxiety, and discontinued benzodiazepines  Bipolar disorder  -Per psychiatry -Per patient, she does not have a history of bipolar disorder  Code Status: Full  Family Communication: None at bedside.  Disposition Plan: Admitted.  Pending inpatient psychiatry placement.   Currently on waiting list.   Time Spent in minutes   25 minutes  Procedures/Significant  Events CXR 5/1 >>> no acute process CT head 5/1 >>> no acute process 5/1 - brought to ED after being found unresponsive. Admitted with tylenol overdose. 01/21/15: Extubated by MD  Consults   Dr. Ambrose Finland (psychiatry)  DVT Prophylaxis  Patient ambulatory   Lab Results  Component Value Date   PLT 439* 02/05/2015    Medications  Scheduled Meds: . diphenhydrAMINE  25 mg  Oral Once  . feeding supplement (RESOURCE BREEZE)  1 Container Oral Q24H  . FLUoxetine  20 mg Oral Daily  . folic acid  1 mg Oral Daily  . gabapentin  300 mg Oral TID  . pantoprazole (PROTONIX) IV  40 mg Intravenous Q12H  . sucralfate  1 g Oral TID WC & HS  . thiamine  100 mg Oral Daily   Continuous Infusions: . sodium chloride 100 mL/hr at 02/06/15 0847   PRN Meds:.alum & mag hydroxide-simeth, benzocaine, butalbital-acetaminophen-caffeine, diphenhydrAMINE, methocarbamol, ondansetron, oxyCODONE, phenol, sodium chloride  Antibiotics    Anti-infectives    None      Subjective:  Pain in stomach better Eating better  Objective:   Filed Vitals:   02/05/15 1635 02/05/15 1849 02/06/15 0133 02/06/15 0629  BP: 137/78 143/78 151/69 121/81  Pulse: 65 77 74 60  Temp: 98.8 F (37.1 C) 99 F (37.2 C) 98.1 F (36.7 C) 98.1 F (36.7 C)  TempSrc: Oral Oral Oral Oral  Resp: 20 18 18 18   Height:      Weight:      SpO2: 99% 100% 99% 100%    Wt Readings from Last 3 Encounters:  02/04/15 61.326 kg (135 lb 3.2 oz)  12/15/14 62.3 kg (137 lb 5.6 oz)  10/27/14 54.9 kg (121 lb 0.5 oz)     Intake/Output Summary (Last 24 hours) at 02/06/15 1212 Last data filed at 02/06/15 0800  Gross per 24 hour  Intake    720 ml  Output      4 ml  Net    716 ml    Exam  General:  no distress  HEENT: NCAT, mucous membranes moist.  Cardiovascular: S1 S2 auscultated, no murmurs, RRR  Respiratory: Clear to auscultation  Abdomen: Soft, mild tenderness, nondistended, + bowel sounds    Data Review   Micro Results No results found for this or any previous visit (from the past 240 hour(s)).  Radiology Reports Ct Abdomen Pelvis Wo Contrast  02/03/2015   CLINICAL DATA:  Abdominal pain, nausea, vomiting, diarrhea, question blood in stool, history of alcohol abuse, duodenal ulcer, endometriosis,, overdose  EXAM: CT ABDOMEN AND PELVIS WITHOUT CONTRAST  TECHNIQUE: Multidetector CT imaging of the  abdomen and pelvis was performed following the standard protocol without IV contrast. Sagittal and coronal MPR images reconstructed from axial data set. Patient drank dilute oral contrast for exam.  COMPARISON:  10/05/2014  FINDINGS: Lung bases clear.  Gallbladder surgically absent.  Abnormal axis of RIGHT kidney unchanged.  Within limits of a nonenhanced exam, liver, spleen, pancreas, kidneys, and adrenal glands otherwise normal appearance.  Distended urinary bladder without focal abnormality.  Uterus and appendix surgically absent with nonvisualization of ovaries.  Distal colon unopacified and under-distended unable to exclude wall thickening.  Stomach and remaining bowel loops normal appearance.  No mass, adenopathy, free fluid or free air.  Osseous structures unremarkable.  IMPRESSION: No definite acute intra-abdominal  or intrapelvic abnormalities.  Suboptimal assessment of rectosigmoid colon due to under-distended and unopacified state.   Electronically Signed   By: Lavonia Dana M.D.   On: 02/03/2015 20:23   Ct Head Wo Contrast  01/19/2015   CLINICAL DATA:  Found in car unresponsive  EXAM: CT HEAD WITHOUT CONTRAST  TECHNIQUE: Contiguous axial images were obtained from the base of the skull through the vertex without intravenous contrast.  COMPARISON:  12/10/2014  FINDINGS: There is no intracranial hemorrhage, mass or evidence of acute infarction. Gray matter and white matter are normal. The ventricles and basal cisterns appear unremarkable.  The bony structures are intact. The visible portions of the paranasal sinuses are clear.  IMPRESSION: Normal brain   Electronically Signed   By: Andreas Newport M.D.   On: 01/19/2015 23:40   Dg Chest Port 1 View  01/22/2015   CLINICAL DATA:  Common a overdose, respiratory failure  EXAM: PORTABLE CHEST - 1 VIEW  COMPARISON:  Portable chest x-ray of Jan 21, 2015  FINDINGS: There is been interval extubation of the trachea and esophagus. The lungs are reasonably well  inflated. There is minimal subsegmental atelectasis at the bases. There is no pleural effusion. The cardiac silhouette is top-normal in size. The pulmonary vascularity is not engorged. The bony thorax is unremarkable.  IMPRESSION: Interval extubation of the trachea and esophagus. Minimal persistent bibasilar subsegmental atelectasis.   Electronically Signed   By: David  Martinique M.D.   On: 01/22/2015 07:36   Portable Chest Xray  01/21/2015   CLINICAL DATA:  Respiratory failure .  EXAM: PORTABLE CHEST - 1 VIEW  COMPARISON:  01/19/2015.  FINDINGS: Endotracheal tube in stable position. Interim placement of NG tube, its tip is projected over the stomach. Mediastinum hilar structures are normal. Bibasilar atelectasis and/or infiltrates again noted. Small bilateral pleural effusions cannot be excluded. No pneumothorax.  IMPRESSION: 1. Interim placement of NG tube, its tip is below the left hemidiaphragm. Endotracheal tube in stable position . 2. Stable bibasilar atelectasis and/or infiltrates .   Electronically Signed   By: Marcello Moores  Register   On: 01/21/2015 07:25   Dg Chest Portable 1 View  01/19/2015   CLINICAL DATA:  Overdose, unresponsive  EXAM: PORTABLE CHEST - 1 VIEW  COMPARISON:  11/25/2014  FINDINGS: Endotracheal tube tip is 3.8 cm above the carina. There is minimal curvilinear atelectasis above the slightly elevated right hemidiaphragm. The lungs are otherwise clear. There is no large effusion. Hilar, mediastinal and cardiac contours appear unremarkable.  IMPRESSION: Satisfactory ET tube position. Minimal linear atelectatic appearing opacities in the right base.   Electronically Signed   By: Andreas Newport M.D.   On: 01/19/2015 23:27    CBC  Recent Labs Lab 02/03/15 1517 02/03/15 2116 02/04/15 0430 02/05/15 0415  WBC 7.8 8.7 7.3 7.0  HGB 12.4 11.8* 11.5* 11.1*  HCT 37.4 36.8 35.8* 33.6*  PLT 562* 535* 471* 439*  MCV 94.0 95.6 95.7 94.4  MCH 31.2 30.6 30.7 31.2  MCHC 33.2 32.1 32.1 33.0  RDW  12.8 12.8 13.0 12.7    Chemistries   Recent Labs Lab 02/02/15 1518 02/03/15 1517 02/04/15 0430  NA 138 138 140  K 4.1 3.9 3.8  CL 99* 99* 102  CO2 28 28 28   GLUCOSE 139* 86 88  BUN 9 9 7   CREATININE 0.72 0.66 0.77  CALCIUM 9.8 9.5 9.3   ------------------------------------------------------------------------------------------------------------------ estimated creatinine clearance is 74.1 mL/min (by C-G formula based on Cr of 0.77). ------------------------------------------------------------------------------------------------------------------ No results  for input(s): HGBA1C in the last 72 hours. ------------------------------------------------------------------------------------------------------------------ No results for input(s): CHOL, HDL, LDLCALC, TRIG, CHOLHDL, LDLDIRECT in the last 72 hours. ------------------------------------------------------------------------------------------------------------------ No results for input(s): TSH, T4TOTAL, T3FREE, THYROIDAB in the last 72 hours.  Invalid input(s): FREET3 ------------------------------------------------------------------------------------------------------------------ No results for input(s): VITAMINB12, FOLATE, FERRITIN, TIBC, IRON, RETICCTPCT in the last 72 hours.  Coagulation profile No results for input(s): INR, PROTIME in the last 168 hours.  No results for input(s): DDIMER in the last 72 hours.  Cardiac Enzymes No results for input(s): CKMB, TROPONINI, MYOGLOBIN in the last 168 hours.  Invalid input(s): CK ------------------------------------------------------------------------------------------------------------------ Invalid input(s): POCBNP    Jayko Voorhees DO. on 02/06/2015 at 12:12 PM [phone]   After 7pm go to [email or web address] - password [email or web address]  And look for the night coverage person covering for me after hours  [facility] Office  [phone]

## 2015-02-06 NOTE — Progress Notes (Signed)
Per Leveda Anna, CSW,  Patient will be able to be discharged tonight. MD paged and aware. [facility] called and report given. IV team aware to remove PICC. Patient awaiting for transport.    Ave Filter, RN

## 2015-02-06 NOTE — Discharge Summary (Addendum)
Physician Discharge Summary  [name] MEQ:[id number] DOB: 1965-11-22 DOA: 01/19/2015  PCP: No PCP Per Patient  Admit date: 01/19/2015 Discharge date: 02/06/2015  Time spent: 35 minutes  Recommendations for Outpatient Follow-up:  1. To inpatient psych  Discharge Diagnoses:  Principal Problem:   Suicide attempt Active Problems:   Tylenol overdose   Respiratory failure   Amitriptyline overdose   Respiratory acidosis   Prolonged Q-T interval on ECG   Alcohol abuse   Depression   Anxiety state   Bipolar I disorder, most recent episode (or current) unspecified   Abdominal pain   Discharge Condition: improved  Diet recommendation: soft diet  Filed Weights   02/01/15 0500 02/01/15 1600 02/04/15 0608  Weight: 63.504 kg (140 lb) 63 kg (138 lb 14.2 oz) 61.326 kg (135 lb 3.2 oz)    History of present illness:  Pt is encephalopathic; therefore, this HPI is obtained from chart review. [name] is a 49 y.o. F with PMH as outlined below. She was brought to [facility] ED 5/1 by EMS after a bystander saw her slumped over in her car in a nearby parking lot. [facility] first responded and found her unresponsive with ? Convulsions. [facility] found a brown paper bag with aluminum foil and a spoon in her car. No street drugs were found. She was brought to ED where she remained unresponsive to sternal rub or ammonia inhalants. She was intubated for airway protection and PCCM was called for admission.  Of note, she has hx of admission for intentional tylenol overdose as part of suicide attempt. Last admission here for this was in 2015 where tylenol level was 492. On this admission, tylenol level was 140. UDS is pending.  She was seen in consultation by neurology. She apparently was just discharged from [facility] facility earlier this month and while there, she had a an EEG performed for concern of seizure vs pseudoseizures. EEG captured on of her typical events and demonstrated that it was not  epileptic in nature. CT head was negative and neuro recommended obtaining ammonia and depakote levels.   Hospital Course:  Suicide attempt - suicide precaution -Patient is medically stable for d/c to psych unit - inpatient psych.  Abdominal pain, diarrhea; Continue withprotonix BID- had ulcers on EGD in January Hb stable CT abdomen unrevealing.  -added carafate with much improvement  Acetaminophen overdose - with hx of intentional OD as part of suicide attempt -Multi medication overdose to include Tylenol, amitriptyline, and Soma.  -Patient's liver enzymes within normal limits currently  -Patient received N-acetylcysteine with loading dose following by maintenance infusion.Pharmacy to monitor NAC. -Acetaminophen level<10 on 5/3. -Tylenol level less than 10 on 5-11  Neck pain, back pain: Suspect MSK.   Acute respiratory failure following acetaminophen/amitriptyline/soma overdose -Resolved  Respiratory acidosis -Resolved  Tobacco use disorder -Tobacco cessation counseling.  Prolonged QTc on admission  -Most likely secondary to amitriptyline overdose, resolved on subsequent EKG  Hypokalemia -Resolved, continue to monitor BMP and replace as needed   Hx psychiatric pseudoseizure, ETOH abuse, depression, anxiety -UDS positive for opiates and benzodiazepines which were expected. -Psychiatry consulted and recommended Fluoxetine 10 mg daily, gabapentin 300mg  TID for anxiety, and discontinued benzodiazepines  Bipolar disorder  -Per psychiatry -Per patient, she does not have a history of bipolar disorder   Procedures:  none  Consultations:  psych  Discharge Exam: Filed Vitals:   02/06/15 0629  BP: 121/81  Pulse: 60  Temp: 98.1 F (36.7 C)  Resp: 18    General:  A+Ox3, NAD Cardiovascular: rrr Respiratory: clear  Discharge Instructions    Current Discharge Medication List    START taking these medications   Details  alum & mag hydroxide-simeth  (MAALOX/MYLANTA) 200-200-20 MG/5ML suspension Take 30 mLs by mouth every 6 (six) hours as needed for indigestion or heartburn. Qty: 355 mL, Refills: 0    feeding supplement, RESOURCE BREEZE, (RESOURCE BREEZE) LIQD Take 1 Container by mouth 3 (three) times daily between meals. Refills: 0    folic acid (FOLVITE) 1 MG tablet Take 1 tablet (1 mg total) by mouth daily.    ondansetron (ZOFRAN-ODT) 4 MG disintegrating tablet Take 1 tablet (4 mg total) by mouth every 8 (eight) hours as needed for nausea or vomiting. Qty: 20 tablet, Refills: 0    sucralfate (CARAFATE) 1 GM/10ML suspension Take 10 mLs (1 g total) by mouth 4 (four) times daily -  with meals and at bedtime. Qty: 420 mL, Refills: 0    thiamine 100 MG tablet Take 1 tablet (100 mg total) by mouth daily.      CONTINUE these medications which have CHANGED   Details  FLUoxetine (PROZAC) 20 MG capsule Take 1 capsule (20 mg total) by mouth daily. Refills: 3    gabapentin (NEURONTIN) 300 MG capsule Take 1 capsule (300 mg total) by mouth 3 (three) times daily.      CONTINUE these medications which have NOT CHANGED   Details  butalbital-acetaminophen-caffeine (FIORICET, ESGIC) 50-325-40 MG per tablet Take 1 tablet by mouth every 6 (six) hours as needed for headache. Qty: 14 tablet, Refills: 0    Calcium Carbonate-Vitamin D (CALCIUM-VITAMIN D3 PO) Take 1 capsule by mouth daily.    Multiple Vitamin (MULTIVITAMIN WITH MINERALS) TABS tablet Take 1 tablet by mouth daily.    pantoprazole (PROTONIX) 40 MG tablet Take 1 tablet (40 mg total) by mouth 2 (two) times daily. Qty: 60 tablet, Refills: 0      STOP taking these medications     amitriptyline (ELAVIL) 50 MG tablet      carisoprodol (SOMA) 350 MG tablet      clonazePAM (KLONOPIN) 1 MG tablet      diphenhydrAMINE (BENADRYL) 25 MG tablet      divalproex (DEPAKOTE ER) 250 MG 24 hr tablet      hydrOXYzine (ATARAX/VISTARIL) 25 MG tablet       isometheptene-acetaminophen-dichloralphenazone (MIDRIN) 65-325-100 MG capsule      levETIRAcetam (KEPPRA) 500 MG tablet      lisinopril (PRINIVIL,ZESTRIL) 20 MG tablet      methocarbamol (ROBAXIN) 750 MG tablet        Allergies  Allergen Reactions  . Aspirin Anaphylaxis  . Doxycycline Anaphylaxis and Other (See Comments)    Joint damage also  . Imitrex [Sumatriptan Base] Other (See Comments)    Severe hypertension  . Iodinated Diagnostic Agents Anaphylaxis  . Lidocaine Anaphylaxis    Pt states she does well with Marcaine/Bupivicaine without problem  . Prednisone Other (See Comments)    She goes crazy  . Sulfa Antibiotics Anaphylaxis  . Sulfasalazine Anaphylaxis  . Sumatriptan Other (See Comments)    Felt like she was "having a stroke", heart rate and blood pressure "went through the roof"  . Tramadol Other (See Comments)    seizures  . Levofloxacin Other (See Comments)    Joints hurt  . Latex Rash  . Levofloxacin Rash and Other (See Comments)    Joints hurt  . Metrizamide Other (See Comments)  . Nsaids Rash and Other (See Comments)  GI bleed  . Tolmetin Rash      The results of significant diagnostics from this hospitalization (including imaging, microbiology, ancillary and laboratory) are listed below for reference.    Significant Diagnostic Studies: Ct Abdomen Pelvis Wo Contrast  02/03/2015   CLINICAL DATA:  Abdominal pain, nausea, vomiting, diarrhea, question blood in stool, history of alcohol abuse, duodenal ulcer, endometriosis,, overdose  EXAM: CT ABDOMEN AND PELVIS WITHOUT CONTRAST  TECHNIQUE: Multidetector CT imaging of the abdomen and pelvis was performed following the standard protocol without IV contrast. Sagittal and coronal MPR images reconstructed from axial data set. Patient drank dilute oral contrast for exam.  COMPARISON:  10/05/2014  FINDINGS: Lung bases clear.  Gallbladder surgically absent.  Abnormal axis of RIGHT kidney unchanged.  Within limits of  a nonenhanced exam, liver, spleen, pancreas, kidneys, and adrenal glands otherwise normal appearance.  Distended urinary bladder without focal abnormality.  Uterus and appendix surgically absent with nonvisualization of ovaries.  Distal colon unopacified and under-distended unable to exclude wall thickening.  Stomach and remaining bowel loops normal appearance.  No mass, adenopathy, free fluid or free air.  Osseous structures unremarkable.  IMPRESSION: No definite acute intra-abdominal or intrapelvic abnormalities.  Suboptimal assessment of rectosigmoid colon due to under-distended and unopacified state.   Electronically Signed   By: Lavonia Dana M.D.   On: 02/03/2015 20:23   Ct Head Wo Contrast  01/19/2015   CLINICAL DATA:  Found in car unresponsive  EXAM: CT HEAD WITHOUT CONTRAST  TECHNIQUE: Contiguous axial images were obtained from the base of the skull through the vertex without intravenous contrast.  COMPARISON:  12/10/2014  FINDINGS: There is no intracranial hemorrhage, mass or evidence of acute infarction. Gray matter and white matter are normal. The ventricles and basal cisterns appear unremarkable.  The bony structures are intact. The visible portions of the paranasal sinuses are clear.  IMPRESSION: Normal brain   Electronically Signed   By: Andreas Newport M.D.   On: 01/19/2015 23:40   Dg Chest Port 1 View  01/22/2015   CLINICAL DATA:  Common a overdose, respiratory failure  EXAM: PORTABLE CHEST - 1 VIEW  COMPARISON:  Portable chest x-ray of Jan 21, 2015  FINDINGS: There is been interval extubation of the trachea and esophagus. The lungs are reasonably well inflated. There is minimal subsegmental atelectasis at the bases. There is no pleural effusion. The cardiac silhouette is top-normal in size. The pulmonary vascularity is not engorged. The bony thorax is unremarkable.  IMPRESSION: Interval extubation of the trachea and esophagus. Minimal persistent bibasilar subsegmental atelectasis.    Electronically Signed   By: David  Martinique M.D.   On: 01/22/2015 07:36   Portable Chest Xray  01/21/2015   CLINICAL DATA:  Respiratory failure .  EXAM: PORTABLE CHEST - 1 VIEW  COMPARISON:  01/19/2015.  FINDINGS: Endotracheal tube in stable position. Interim placement of NG tube, its tip is projected over the stomach. Mediastinum hilar structures are normal. Bibasilar atelectasis and/or infiltrates again noted. Small bilateral pleural effusions cannot be excluded. No pneumothorax.  IMPRESSION: 1. Interim placement of NG tube, its tip is below the left hemidiaphragm. Endotracheal tube in stable position . 2. Stable bibasilar atelectasis and/or infiltrates .   Electronically Signed   By: Marcello Moores  Register   On: 01/21/2015 07:25   Dg Chest Portable 1 View  01/19/2015   CLINICAL DATA:  Overdose, unresponsive  EXAM: PORTABLE CHEST - 1 VIEW  COMPARISON:  11/25/2014  FINDINGS: Endotracheal tube tip is 3.8 cm  above the carina. There is minimal curvilinear atelectasis above the slightly elevated right hemidiaphragm. The lungs are otherwise clear. There is no large effusion. Hilar, mediastinal and cardiac contours appear unremarkable.  IMPRESSION: Satisfactory ET tube position. Minimal linear atelectatic appearing opacities in the right base.   Electronically Signed   By: Andreas Newport M.D.   On: 01/19/2015 23:27    Microbiology: No results found for this or any previous visit (from the past 240 hour(s)).   Labs: Basic Metabolic Panel:  Recent Labs Lab 02/02/15 1518 02/03/15 1517 02/04/15 0430  NA 138 138 140  K 4.1 3.9 3.8  CL 99* 99* 102  CO2 28 28 28   GLUCOSE 139* 86 88  BUN 9 9 7   CREATININE 0.72 0.66 0.77  CALCIUM 9.8 9.5 9.3   Liver Function Tests: No results for input(s): AST, ALT, ALKPHOS, BILITOT, PROT, ALBUMIN in the last 168 hours.  Recent Labs Lab 02/05/15 1055  LIPASE 20*   No results for input(s): AMMONIA in the last 168 hours. CBC:  Recent Labs Lab 02/03/15 1517  02/03/15 2116 02/04/15 0430 02/05/15 0415  WBC 7.8 8.7 7.3 7.0  HGB 12.4 11.8* 11.5* 11.1*  HCT 37.4 36.8 35.8* 33.6*  MCV 94.0 95.6 95.7 94.4  PLT 562* 535* 471* 439*   Cardiac Enzymes: No results for input(s): CKTOTAL, CKMB, CKMBINDEX, TROPONINI in the last 168 hours. BNP: BNP (last 3 results) No results for input(s): BNP in the last 8760 hours.  ProBNP (last 3 results) No results for input(s): PROBNP in the last 8760 hours.  CBG: No results for input(s): GLUCAP in the last 168 hours.     SignedEulogio Bear  [facility] 02/06/2015, 2:31 PM

## 2015-02-06 NOTE — Progress Notes (Signed)
Patient is currently on c.diff precautions and request to walk outside of her room. Discussed with Mickel Baas (Infection Prevention RN) and patient is allow to ambulate with Enteric precautions in placed per policy. Discussed policy with patient.  Ave Filter, RN

## 2015-02-06 NOTE — Progress Notes (Signed)
Chaplain initiated follow up with pt. Pt discussed her care since last chaplain visit. Chaplain employed empathetic listening. Chaplain will continue to follow.   02/06/15 1000  Clinical Encounter Type  Visited With Patient  Visit Type Follow-up;Spiritual support  Spiritual Encounters  Spiritual Needs Emotional  Stress Factors  Patient Stress Factors Major life changes  Meko Bellanger, Barbette Hair, Chaplain 02/06/2015 10:15 AM

## 2015-02-06 NOTE — Clinical Social Work Note (Signed)
Patient continues to have fluid suicidal ideations.  Patient reports not wanting to "live like this anymore".  Patient states that she feels better being in the hospital, but knows that her life is still the same.  Patient feels that she needs help to become stabilized.  Psychiatry continues to recommend inpatient psychiatric hospitalization at time of discharge.  Patient continues to be on the [facility]) waitlist.  Projected bed availability: the week of 02/10/2015.  RN updated.  Of note, patient currently has PICC line.  Patient cannot discharge to psychiatric hospital with PICC line.  Patient will need to be medically cleared of PICC line prior to discharge to [facility].    Nonnie Done, LCSW [phone]  Psychiatric & Orthopedics (5N 1-8) Clinical Social Worker

## 2015-02-06 NOTE — Progress Notes (Signed)
CSW confirmed that pt has been accepted (by Marlowe Kays) to [facility]  [facility] called for transport and they will be coming this pm.  Packet prepared.  Discussed with RN.

## 2015-10-25 IMAGING — CR DG HAND COMPLETE 3+V*R*
3 series · 3 of 3 positions shown · non-contrast
Comparison: None.

CLINICAL DATA: Post fall, now with pain involving the first MCP

EXAM:
RIGHT HAND - COMPLETE 3+ VIEW

[x hand pa right]
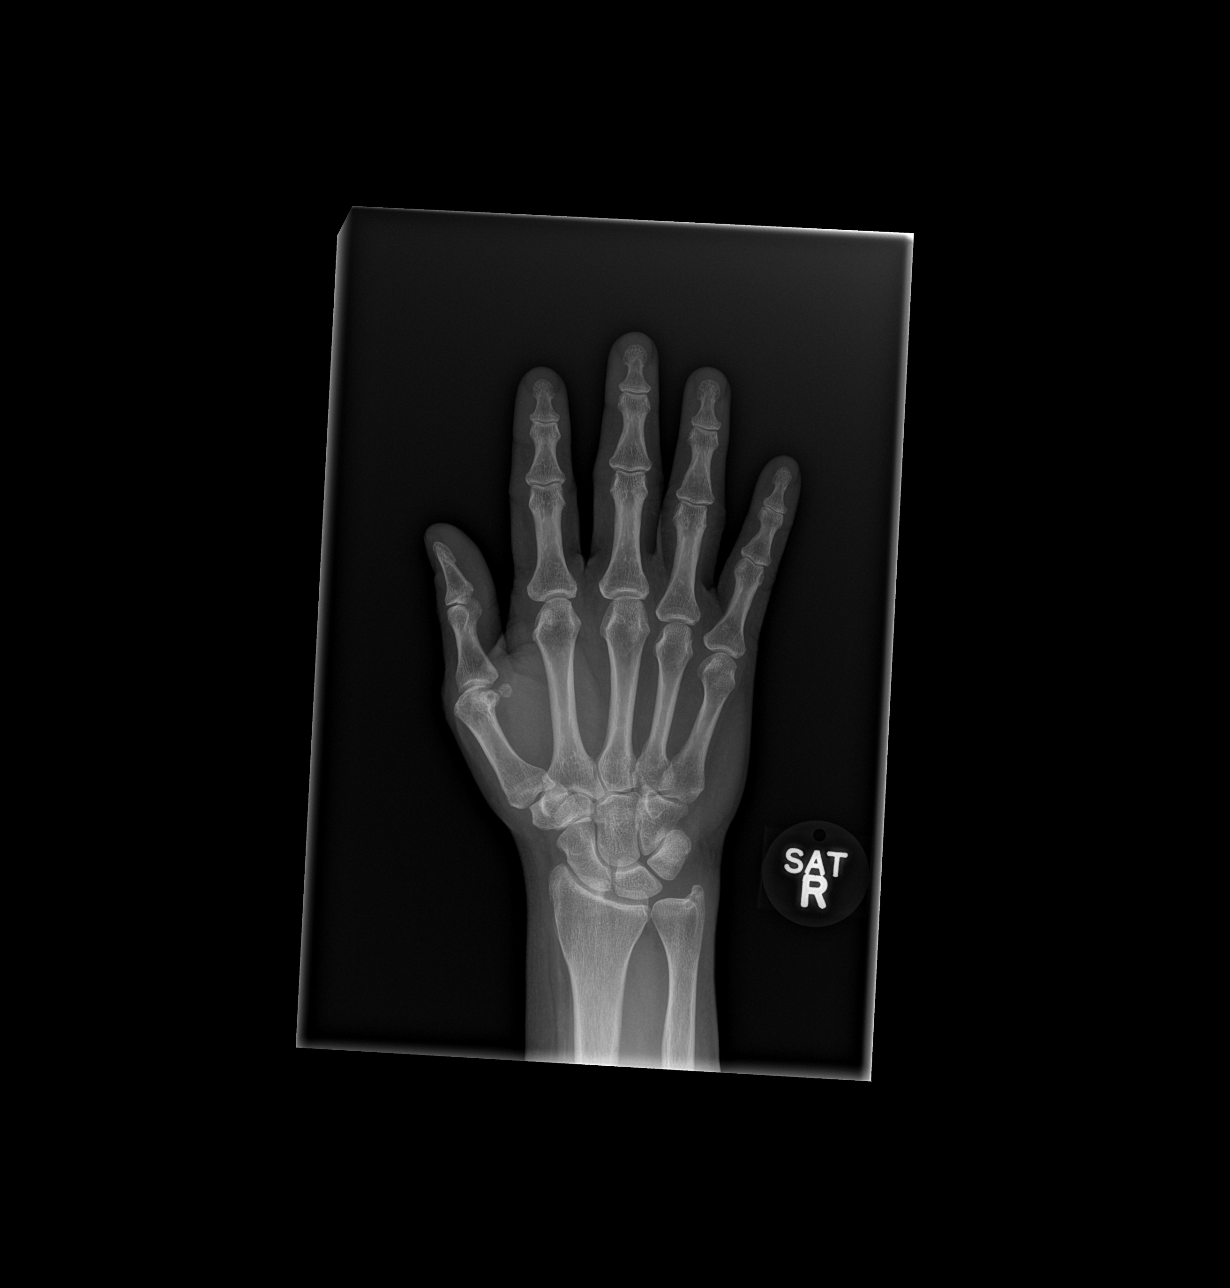

[x hand obl right]
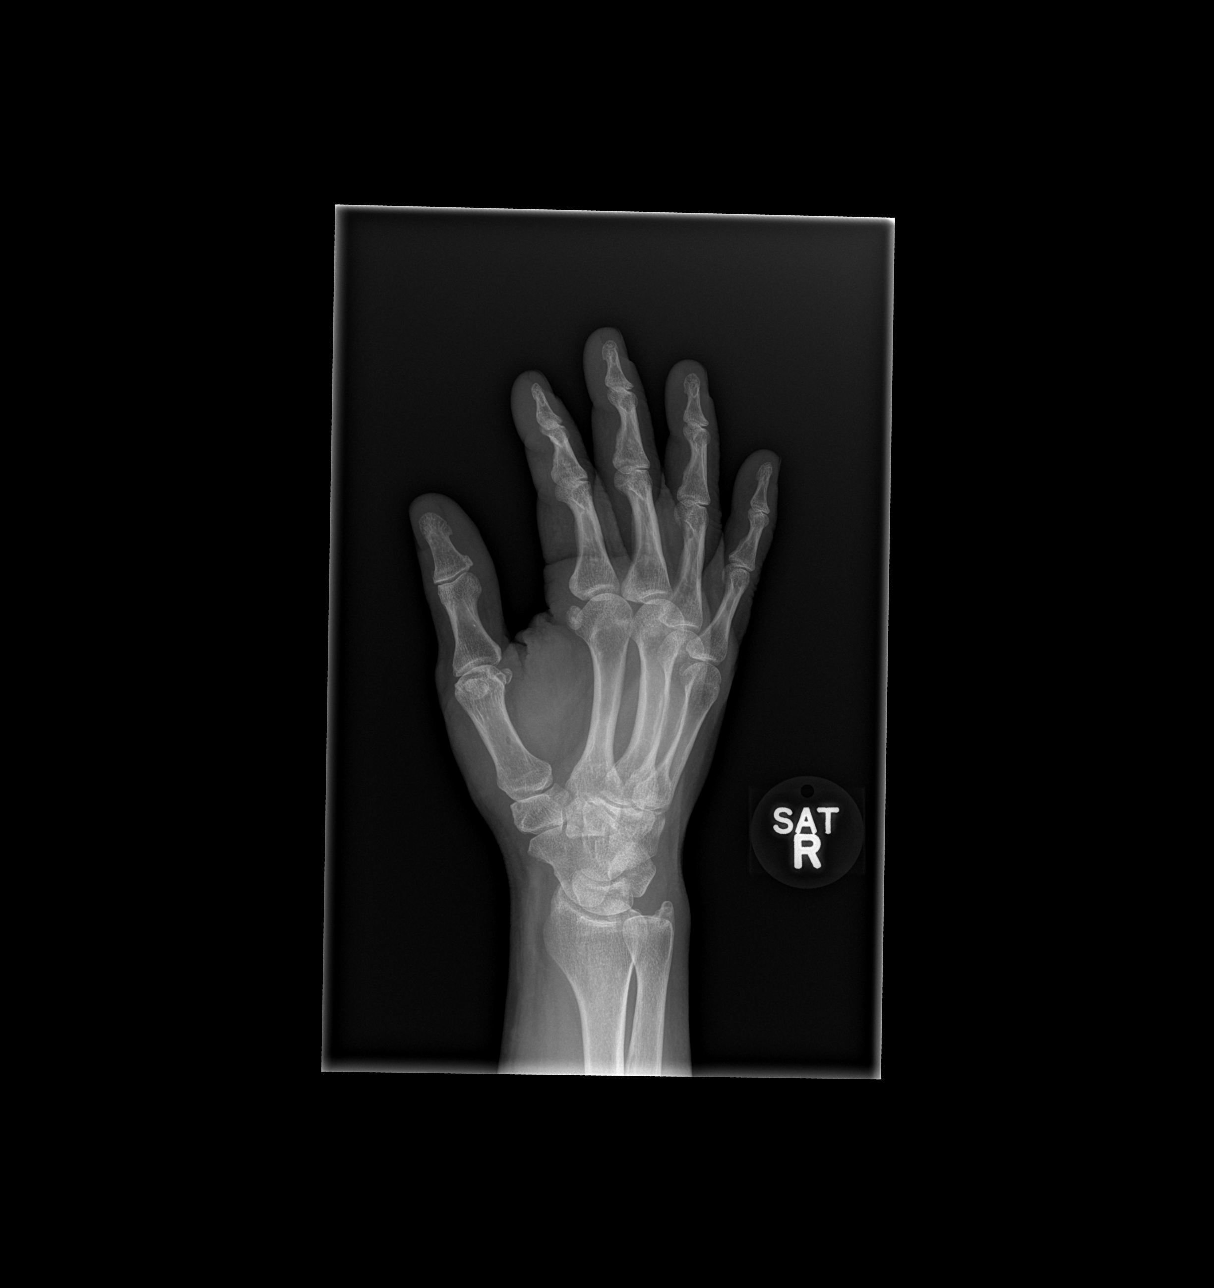

[x hand lat right]
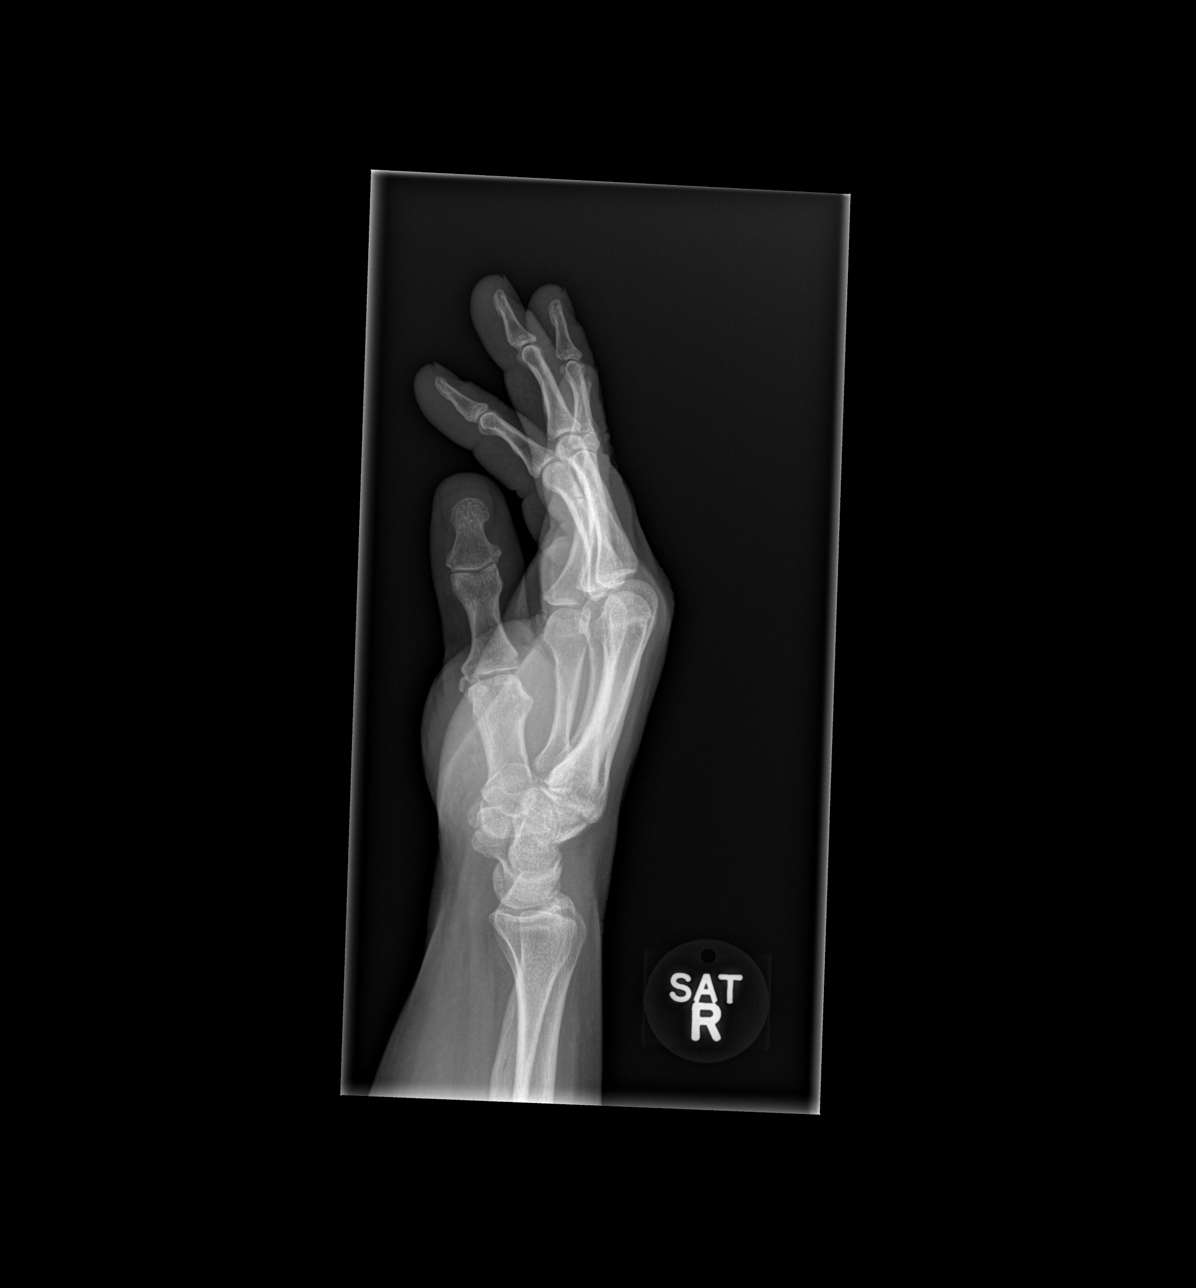

[3 of 3 positions shown; findings below may reference images not displayed]

FINDINGS: No fracture or dislocation special attention paid to the first MCP
joint. Joint spaces are preserved. No definite erosions. No evidence
of chondrocalcinosis. Regional soft tissues are normal. No
radiopaque foreign body.
IMPRESSION: No fracture or dislocation.

## 2015-11-19 ENCOUNTER — Emergency Department (HOSPITAL_BASED_OUTPATIENT_CLINIC_OR_DEPARTMENT_OTHER)
Admission: EM | Admit: 2015-11-19 | Discharge: 2015-11-20 | Disposition: A | Discharge status: Home / Self Care | Payer: Medicaid Other | Attending: Emergency Medicine | Admitting: Emergency Medicine

## 2015-11-19 ENCOUNTER — Encounter (HOSPITAL_BASED_OUTPATIENT_CLINIC_OR_DEPARTMENT_OTHER): Payer: Self-pay | Admitting: Emergency Medicine

## 2015-11-19 DIAGNOSIS — Z91040 Latex allergy status: Secondary | ICD-10-CM | POA: Diagnosis not present

## 2015-11-19 DIAGNOSIS — Z79899 Other long term (current) drug therapy: Secondary | ICD-10-CM | POA: Diagnosis not present

## 2015-11-19 DIAGNOSIS — F329 Major depressive disorder, single episode, unspecified: Secondary | ICD-10-CM | POA: Insufficient documentation

## 2015-11-19 DIAGNOSIS — Z8742 Personal history of other diseases of the female genital tract: Secondary | ICD-10-CM | POA: Insufficient documentation

## 2015-11-19 DIAGNOSIS — Z8739 Personal history of other diseases of the musculoskeletal system and connective tissue: Secondary | ICD-10-CM | POA: Diagnosis not present

## 2015-11-19 DIAGNOSIS — F17210 Nicotine dependence, cigarettes, uncomplicated: Secondary | ICD-10-CM | POA: Insufficient documentation

## 2015-11-19 DIAGNOSIS — F419 Anxiety disorder, unspecified: Secondary | ICD-10-CM | POA: Diagnosis not present

## 2015-11-19 DIAGNOSIS — R519 Headache, unspecified: Secondary | ICD-10-CM

## 2015-11-19 DIAGNOSIS — R51 Headache: Secondary | ICD-10-CM | POA: Insufficient documentation

## 2015-11-19 MED ORDER — HALOPERIDOL LACTATE 5 MG/ML IJ SOLN: 2.0000 mg | Freq: Once | INTRAMUSCULAR | Status: DC

## 2015-11-19 MED ORDER — METOCLOPRAMIDE HCL 10 MG PO TABS
20.0000 mg | ORAL_TABLET | Freq: Once | ORAL | Status: AC
  Administered 2015-11-19: 20 mg via ORAL
  Filled 2015-11-19: qty 2

## 2015-11-19 MED ORDER — DIPHENHYDRAMINE HCL 25 MG PO CAPS
50.0000 mg | ORAL_CAPSULE | Freq: Once | ORAL | Status: AC
  Administered 2015-11-19: 50 mg via ORAL
  Filled 2015-11-19: qty 2

## 2015-11-19 MED ORDER — MAGNESIUM SULFATE 2 GM/50ML IV SOLN: 2.0000 g | Freq: Once | INTRAVENOUS | Status: DC

## 2015-11-19 MED ORDER — SODIUM CHLORIDE 0.9 % IV BOLUS (SEPSIS)
1000.0000 mL | Freq: Once | INTRAVENOUS | Status: DC
Start: 2015-11-20 — End: 2015-11-20

## 2015-11-19 NOTE — ED Notes (Signed)
Patient states that she had had a MHA x 3 days. Last night she started to have mid -sternal chest pain after vomiting.

## 2015-11-19 NOTE — ED Provider Notes (Signed)
CSN: [id number]     Arrival date & time 11/19/15  2137 History   First MD Initiated Contact with Patient 11/19/15 2220     Chief Complaint  Patient presents with  . Headache   (Consider location/radiation/quality/duration/timing/severity/associated sxs/prior Treatment) HPI 50 y.o. female with a hx of migraines, presents to the Emergency Department today complaining of headache x 3 days. Notes pain feels like a throbbing sensation bilaterally. Reports pain is 8/10. States that she has had some vision changes bilaterally with photophobia. Has tried OTC tylenol with minimal relief. Has been seen by neurology in the past due to migraines and given Triptans. Does not have any currently. No CP/SOB/ABD pain. Has N/V. No diarrhea. No numbness/tingling. No fevers. No loss of vision. No pain over temples. No other symptoms noted.    Past Medical History  Diagnosis Date  . Migraines   . Endometriosis   . ETOH abuse     sober 3 1/2 years  . Anorexia   . Depression   . Anxiety   . DJD (degenerative joint disease) of cervical spine   . Psychiatric pseudoseizure   . PICC (peripherally inserted central catheter) in place     rt neck  . Seizures [facility])    Past Surgical History  Procedure Laterality Date  . Knee surgery    . Abdominal surgery    . Cholecystectomy    . Appendectomy    . Abdominal hysterectomy    . Nasal sinus surgery    . Laproscopy    . Carpal tunnel release      2010  . Esophagogastroduodenoscopy N/A 10/06/2014    Procedure: ESOPHAGOGASTRODUODENOSCOPY (EGD);  Surgeon: Inda Castle, MD;  Location: [place];  Service: Endoscopy;  Laterality: N/A;   Family History  Problem Relation Age of Onset  . Hypertension Mother   . Hyperlipidemia Mother   . Heart failure Father   . Cancer Other    Social History  Substance Use Topics  . Smoking status: Current Every Day Smoker -- 0.25 packs/day for 20 years    Types: Cigarettes  . Smokeless tobacco: Never Used  . Alcohol  Use: No   OB History    No data available     Review of Systems ROS reviewed and all are negative for acute change except as noted in the HPI.  Allergies  Aspirin; Doxycycline; Imitrex; Iodinated diagnostic agents; Lidocaine; Prednisone; Sulfa antibiotics; Sulfasalazine; Sumatriptan; Tramadol; Levofloxacin; Latex; Levofloxacin; Metrizamide; Nsaids; and Tolmetin  Home Medications   Prior to Admission medications   Medication Sig Start Date End Date Taking? Authorizing Provider  alum & mag hydroxide-simeth (MAALOX/MYLANTA) 200-200-20 MG/5ML suspension Take 30 mLs by mouth every 6 (six) hours as needed for indigestion or heartburn. 02/06/15   Geradine Girt, DO  butalbital-acetaminophen-caffeine (FIORICET, ESGIC) [phone] MG per tablet Take 1 tablet by mouth every 6 (six) hours as needed for headache. 12/18/14   Kelvin Cellar, MD  Calcium Carbonate-Vitamin D (CALCIUM-VITAMIN D3 PO) Take 1 capsule by mouth daily.    Historical Provider, MD  feeding supplement, RESOURCE BREEZE, (RESOURCE BREEZE) LIQD Take 1 Container by mouth 3 (three) times daily between meals. 01/25/15   Maryann Mikhail, DO  FLUoxetine (PROZAC) 20 MG capsule Take 1 capsule (20 mg total) by mouth daily. 02/06/15   Geradine Girt, DO  folic acid (FOLVITE) 1 MG tablet Take 1 tablet (1 mg total) by mouth daily. 01/25/15   Maryann Mikhail, DO  gabapentin (NEURONTIN) 300 MG capsule Take 1 capsule (300  mg total) by mouth 3 (three) times daily. 01/25/15   Maryann Mikhail, DO  Multiple Vitamin (MULTIVITAMIN WITH MINERALS) TABS tablet Take 1 tablet by mouth daily.    Historical Provider, MD  ondansetron (ZOFRAN-ODT) 4 MG disintegrating tablet Take 1 tablet (4 mg total) by mouth every 8 (eight) hours as needed for nausea or vomiting. 02/06/15   Geradine Girt, DO  pantoprazole (PROTONIX) 40 MG tablet Take 1 tablet (40 mg total) by mouth 2 (two) times daily. 10/19/14   Knox Royalty, NP  sucralfate (CARAFATE) 1 GM/10ML suspension Take 10 mLs (1 g  total) by mouth 4 (four) times daily -  with meals and at bedtime. 02/06/15   Geradine Girt, DO  thiamine 100 MG tablet Take 1 tablet (100 mg total) by mouth daily. 01/25/15   Maryann Mikhail, DO   BP 138/94 mmHg  Pulse 110  Temp(Src) 98.5 F (36.9 C) (Oral)  Resp 18  Ht 5\' 2"  (1.575 m)  Wt 76.204 kg  BMI 30.72 kg/m2  SpO2 100%   Physical Exam  Constitutional: She is oriented to person, place, and time. She appears well-developed and well-nourished.  HENT:  Head: Normocephalic and atraumatic.  No pain on palpation of temporal arteries. No bruits auscultated.   Eyes: EOM are normal. Pupils are equal, round, and reactive to light.  Neck: Normal range of motion. Neck supple.  Cardiovascular: Normal rate, regular rhythm, S1 normal, S2 normal, normal heart sounds, intact distal pulses and normal pulses.   Pulmonary/Chest: Effort normal and breath sounds normal.  Abdominal: Soft. There is no tenderness.  Musculoskeletal: Normal range of motion.  Neurological: She is alert and oriented to person, place, and time. She has normal strength. No cranial nerve deficit or sensory deficit.  Skin: Skin is warm and dry.  Psychiatric: She has a normal mood and affect. Her behavior is normal. Thought content normal.  Nursing note and vitals reviewed.  ED Course  Procedures (including critical care time) Labs Review Labs Reviewed - No data to display  Imaging Review No results found. I have personally reviewed and evaluated these images and lab results as part of my medical decision-making.   EKG Interpretation None      MDM  I personally evaluated and interpreted the relevant EKG. I have reviewed the relevant previous healthcare records. I obtained HPI from historian. Patient discussed with supervising physician  ED Course:  Assessment: Patient is a 49yF hx migraines that presents with headache x 4 days. Patient is without high-risk features of headache including: Sudden onset/thunderclap  HA, Altered mental status, Accompanying seizure, Headache with exertion, Age > 50, History of immunocompromise, Neck or shoulder pain, Fever, Use of anticoagulation, Family history of spontaneous SAH, Concomitant drug use, Toxic exposure.  Patient has a normal complete neurological exam, normal vital signs, normal level of consciousness, no signs of meningismus, is well-appearing/non-toxic appearing, no signs of trauma. No papilledema, no pain over the temporal arteries. Imaging with CT/MRI not indicated given history and physical exam findings. No dangerous or life-threatening conditions suspected or identified by history, physical exam, and by work-up. No indications for hospitalization identified. Given benadryl and reglan with minimal relief. Attempted multiple PIVs as well as Ultrasounded guided, but unsuccessful. Has hx of drug abuse so will avoid narcotics. Given IM Haldol with releif of symptoms. At time of discharge, Patient is in no acute distress. Vital Signs are stable. Patient is able to ambulate. Patient able to tolerate PO.    Disposition/Plan:  DC  Home Additional Verbal discharge instructions given and discussed with patient.  Pt Instructed to f/u with PCP in the next 48-72 hours for evaluation and treatment of symptoms. Return precautions given Pt acknowledges and agrees with plan  Supervising Physician Gareth Morgan, MD   Final diagnoses:  Acute nonintractable headache, unspecified headache type      Shary Decamp, PA-C 11/20/15 [id number]  Gareth Morgan, MD 11/21/15 1351

## 2015-11-20 MED ORDER — BUTALBITAL-APAP-CAFFEINE 50-325-40 MG PO TABS: 1.0000 | ORAL_TABLET | Freq: Four times a day (QID) | ORAL | Status: DC | PRN

## 2015-11-20 MED ORDER — HALOPERIDOL LACTATE 5 MG/ML IJ SOLN
5.0000 mg | Freq: Once | INTRAMUSCULAR | Status: AC
  Administered 2015-11-20: 5 mg via INTRAMUSCULAR
  Filled 2015-11-20: qty 1

## 2015-11-20 MED ORDER — HALOPERIDOL LACTATE 5 MG/ML IJ SOLN: 2.5000 mg | Freq: Once | INTRAMUSCULAR | Status: DC

## 2015-11-20 NOTE — Discharge Instructions (Signed)
Please read and follow all provided instructions.  Your diagnoses today include:  1. Acute nonintractable headache, unspecified headache type    Tests performed today include:  Vital signs. See below for your results today.   Medications:  In the Emergency Department you received:  Reglan - antinausea/headache medication  Benadryl - antihistamine to counteract potential side effects of reglan  Also given Haldol   Take any prescribed medications only as directed.  Additional information:  Follow any educational materials contained in this packet.  You are having a headache. No specific cause was found today for your headache. It may have been a migraine or other cause of headache. Stress, anxiety, fatigue, and depression are common triggers for headaches.   Your headache today does not appear to be life-threatening or require hospitalization, but often the exact cause of headaches is not determined in the emergency department. Therefore, follow-up with your doctor is very important to find out what may have caused your headache and whether or not you need any further diagnostic testing or treatment.   Sometimes headaches can appear benign (not harmful), but then more serious symptoms can develop which should prompt an immediate re-evaluation by your doctor or the emergency department.  BE VERY CAREFUL not to take multiple medicines containing Tylenol (also called acetaminophen). Doing so can lead to an overdose which can damage your liver and cause liver failure and possibly death.   Follow-up instructions: Please follow-up with your primary care provider in the next 3 days for further evaluation of your symptoms.   Return instructions:   Please return to the Emergency Department if you experience worsening symptoms.  Return if the medications do not resolve your headache, if it recurs, or if you have multiple episodes of vomiting or cannot keep down fluids.  Return if you have  a change from the usual headache.  RETURN IMMEDIATELY IF you:  Develop a sudden, severe headache  Develop confusion or become poorly responsive or faint  Develop a fever above 100.66F or problem breathing  Have a change in speech, vision, swallowing, or understanding  Develop new weakness, numbness, tingling, incoordination in your arms or legs  Have a seizure  Please return if you have any other emergent concerns.  Additional Information:  Your vital signs today were: BP 111/70 mmHg   Pulse 101   Temp(Src) 98.5 F (36.9 C) (Oral)   Resp 18   Ht 5\' 2"  (1.575 m)   Wt 76.204 kg   BMI 30.72 kg/m2   SpO2 98% If your blood pressure (BP) was elevated above 135/85 this visit, please have this repeated by your doctor within one month. --------------

## 2016-03-09 IMAGING — MR MR HEAD W/O CM
9 of 11 series · 34 of 48 positions shown · non-contrast
Comparison: Head CT 03/18/2014

CLINICAL DATA: Altered mental status. Dizziness, fall, and
seizure-like activity.

EXAM:
MRI HEAD WITHOUT CONTRAST
TECHNIQUE: Multiplanar, multiecho pulse sequences of the brain and surrounding
structures were obtained without intravenous contrast.

[Series 3: T1 · sagittal · 5.0mm · 0.47mm/px · 1 of 23 slices shown]
[im 1/23]
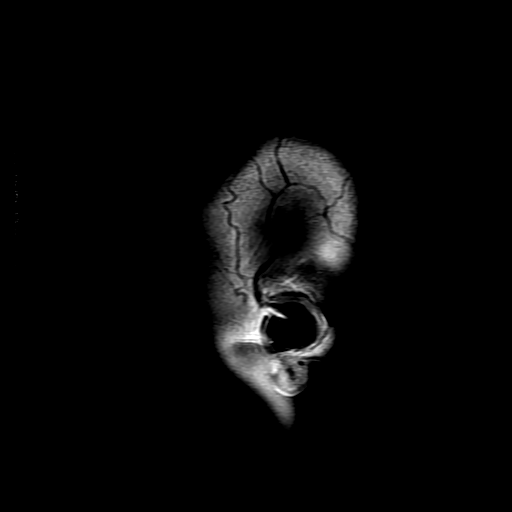

[Series 4: DWI · axial · 5.0mm · 1.09mm/px · z∈[-66,+72]mm · 7 of 58 slices shown (1 of 4)]
[im 1/58]
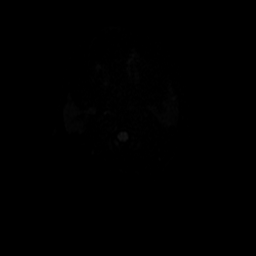
[im 10/58]
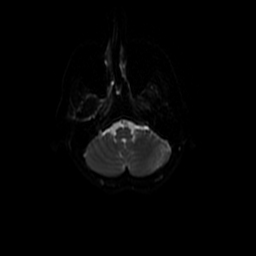
[im 20/58]
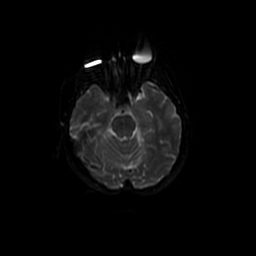
[im 29/58]
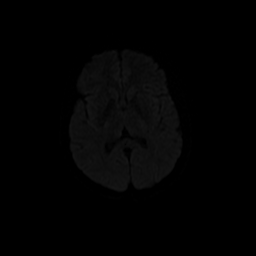
[im 39/58]
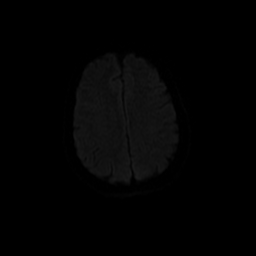
[im 48/58]
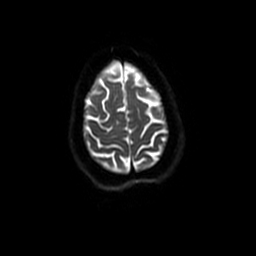
[im 58/58]
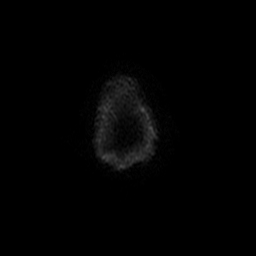

[Series 5: T2 · axial · 5.0mm · 0.43mm/px · z∈[-59,+76]mm · 3 of 24 slices shown (1 of 3)]
[im 1/24]
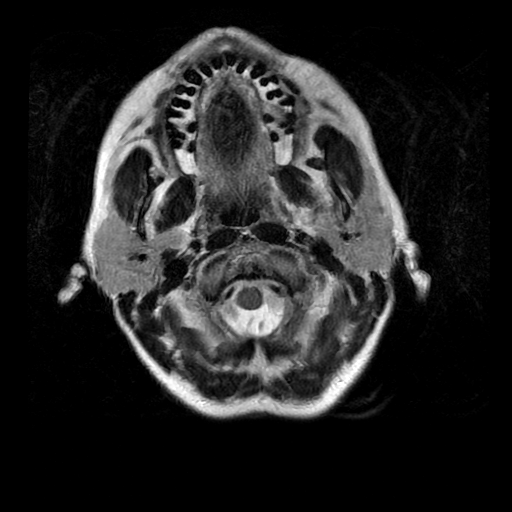
[im 12/24]
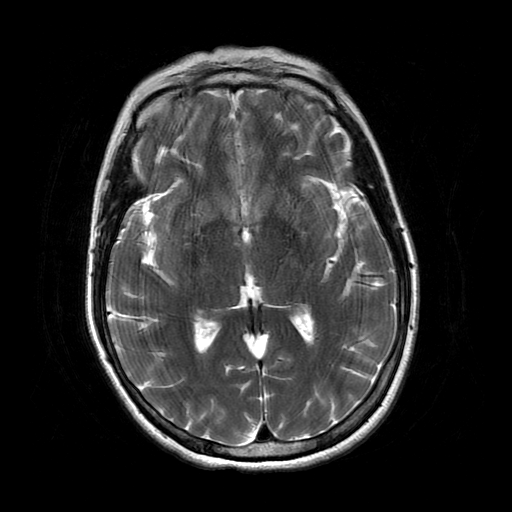
[im 24/24]
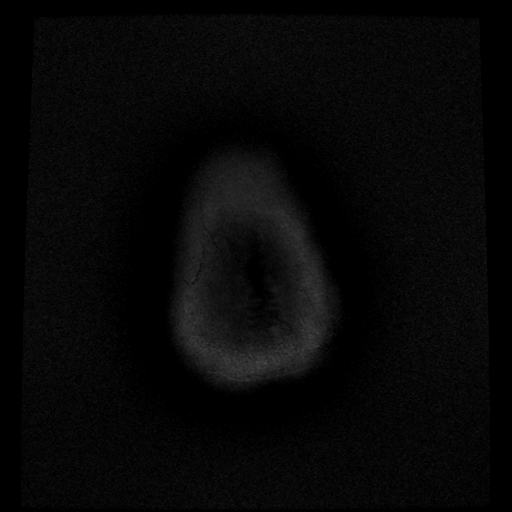

[Series 6: DWI · coronal · 5.0mm · 1.09mm/px · 7 of 64 slices shown (2 of 4)]
[im 1/64]
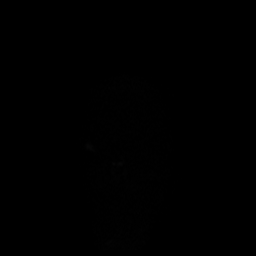
[im 11/64]
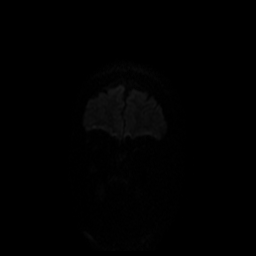
[im 22/64]
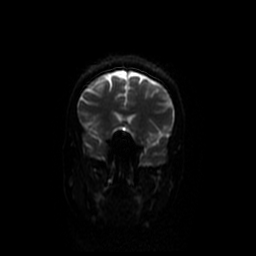
[im 32/64]
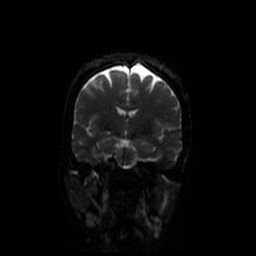
[im 43/64]
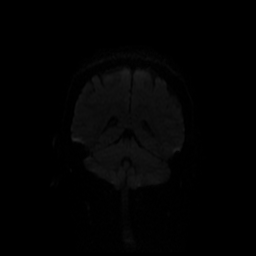
[im 53/64]
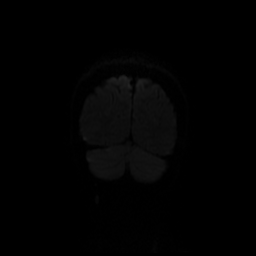
[im 64/64]
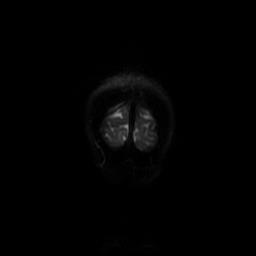

[Series 7: FLAIR · axial · 5.0mm · 0.43mm/px · z∈[-56,+79]mm · 3 of 24 slices shown]
[im 1/24]
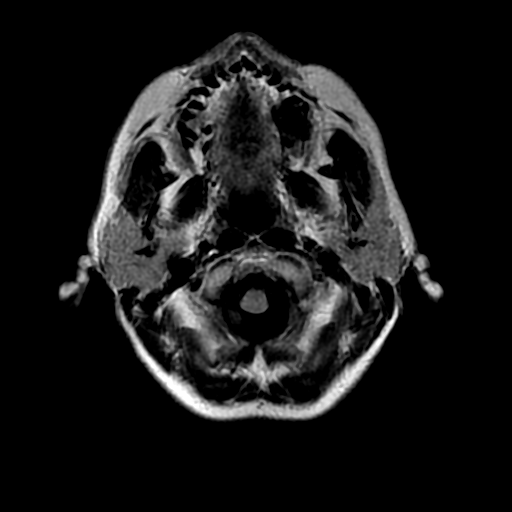
[im 12/24]
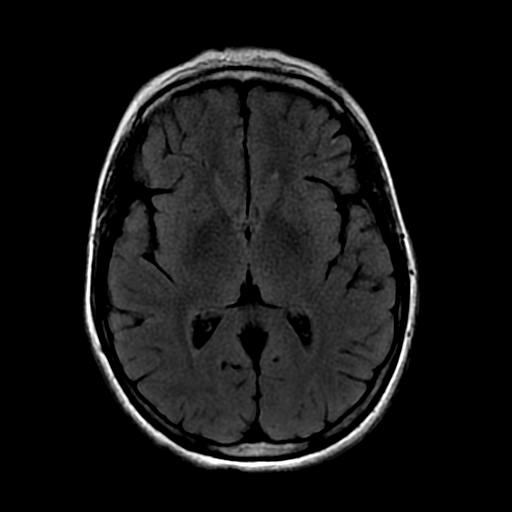
[im 24/24]
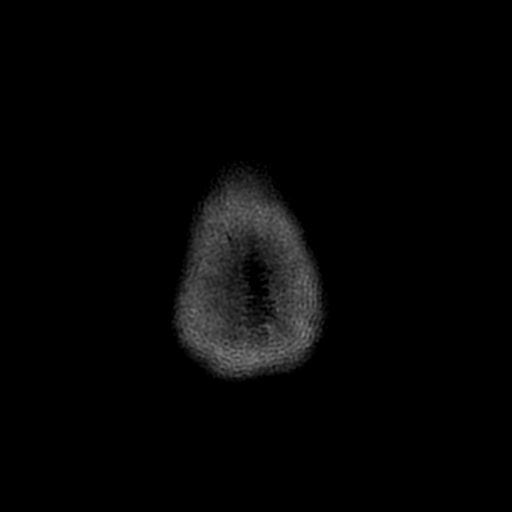

[Series 10: T2 · coronal · 5.0mm · 0.43mm/px · 3 of 27 slices shown (2 of 3)]
[im 1/27]
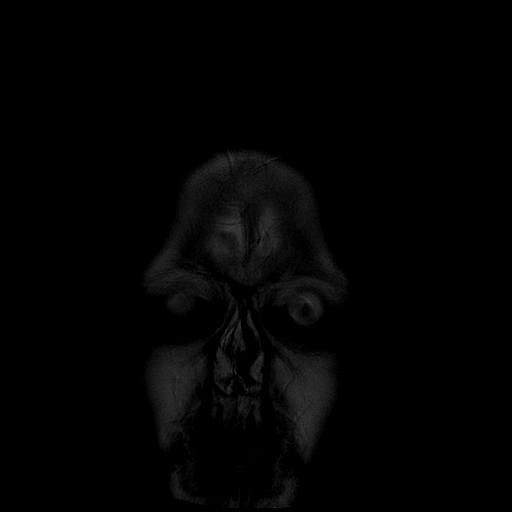
[im 14/27]
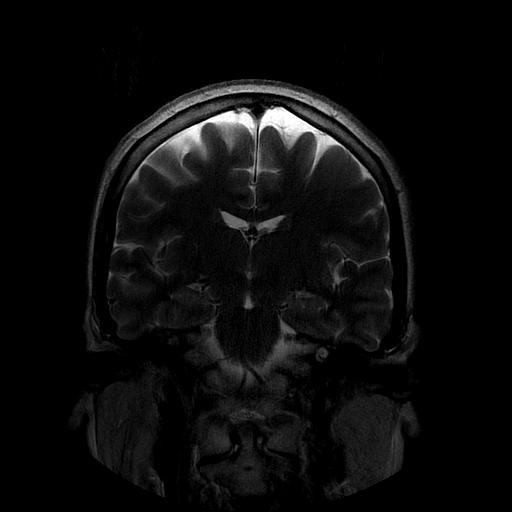
[im 27/27]
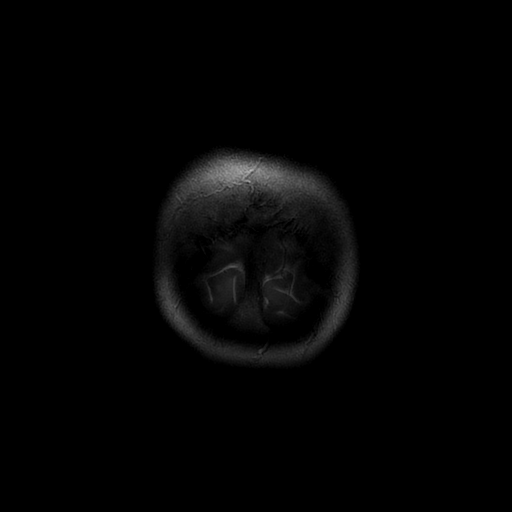

[Series 12: T2 · coronal · 3.0mm · 0.35mm/px · 3 of 27 slices shown (3 of 3)]
[im 1/27]
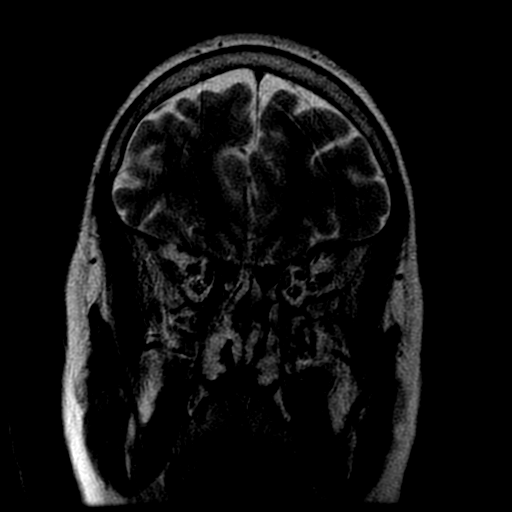
[im 14/27]
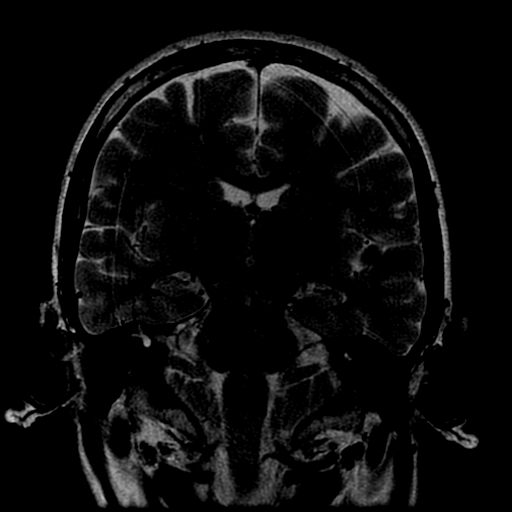
[im 27/27]
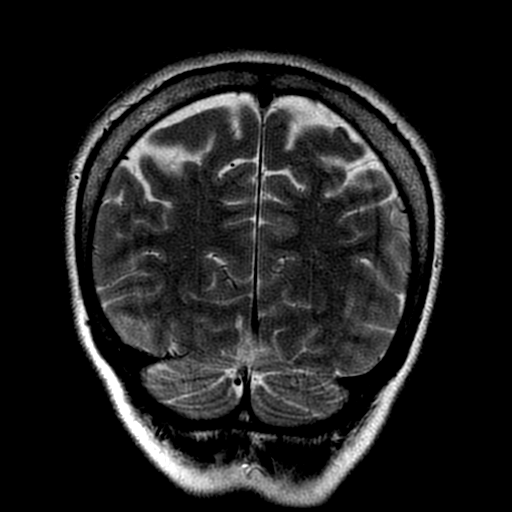

[Series 400: DWI · axial · 5.0mm · 1.09mm/px · z∈[-66,+72]mm · 3 of 29 slices shown (3 of 4)]
[im 1/29]
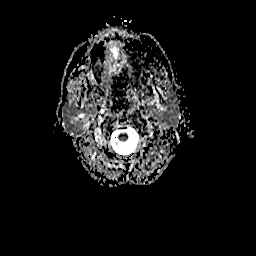
[im 15/29]
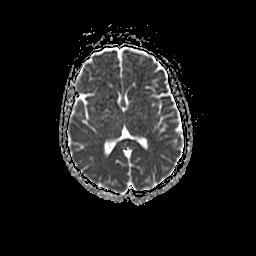
[im 29/29]
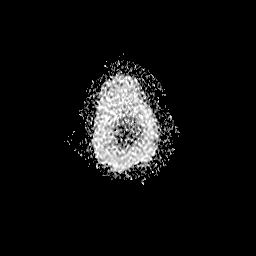

[Series 600: DWI · coronal · 5.0mm · 1.09mm/px · 4 of 32 slices shown (4 of 4)]
[im 1/32]
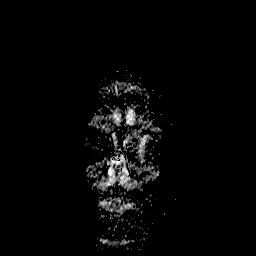
[im 11/32]
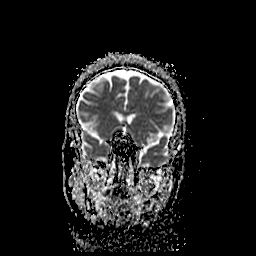
[im 21/32]
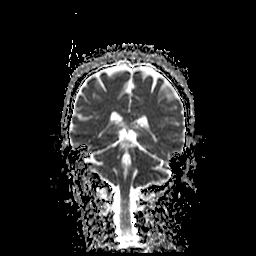
[im 32/32]
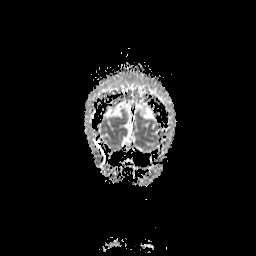

[34 of 48 positions shown; findings below may reference images not displayed]

FINDINGS: Images are mildly degraded by motion.

Dedicated thin section imaging through the temporal lobes
demonstrates normal volume and signal of the hippocampi. There is no
evidence of heterotopia.

There is no evidence of acute infarct, intracranial hemorrhage,
mass, midline shift, or extra-axial fluid collection. Ventricles and
sulci are within normal limits for age. There are a few punctate
foci of T2 hyperintensity within the cerebral white matter,
nonspecific and within normal limits for patient's age.

Orbits are unremarkable. Paranasal sinuses and mastoid air cells are
clear. Major intracranial vascular flow voids are preserved.
IMPRESSION: Unremarkable appearance of the brain for age.

## 2016-12-23 ENCOUNTER — Encounter (HOSPITAL_COMMUNITY): Payer: Self-pay | Admitting: Emergency Medicine

## 2016-12-23 ENCOUNTER — Emergency Department (HOSPITAL_COMMUNITY)
Admission: EM | Admit: 2016-12-23 | Discharge: 2016-12-24 | Disposition: A | Discharge status: Home / Self Care | Payer: Medicaid Other | Attending: Emergency Medicine | Admitting: Emergency Medicine

## 2016-12-23 DIAGNOSIS — Y9389 Activity, other specified: Secondary | ICD-10-CM | POA: Diagnosis not present

## 2016-12-23 DIAGNOSIS — Y990 Civilian activity done for income or pay: Secondary | ICD-10-CM | POA: Insufficient documentation

## 2016-12-23 DIAGNOSIS — Z79899 Other long term (current) drug therapy: Secondary | ICD-10-CM | POA: Insufficient documentation

## 2016-12-23 DIAGNOSIS — M79604 Pain in right leg: Secondary | ICD-10-CM | POA: Diagnosis not present

## 2016-12-23 DIAGNOSIS — F17210 Nicotine dependence, cigarettes, uncomplicated: Secondary | ICD-10-CM | POA: Diagnosis not present

## 2016-12-23 DIAGNOSIS — Z91040 Latex allergy status: Secondary | ICD-10-CM | POA: Diagnosis not present

## 2016-12-23 DIAGNOSIS — X503XXA Overexertion from repetitive movements, initial encounter: Secondary | ICD-10-CM | POA: Insufficient documentation

## 2016-12-23 DIAGNOSIS — Y92096 Garden or yard of other non-institutional residence as the place of occurrence of the external cause: Secondary | ICD-10-CM | POA: Insufficient documentation

## 2016-12-23 NOTE — ED Triage Notes (Signed)
Patient reports injury with pain and mild swelling at right thigh while squatting several times working at yard today , she took Tylenol and Robaxin with no relief.

## 2016-12-24 MED ORDER — TRAMADOL HCL 50 MG PO TABS
50.0000 mg | ORAL_TABLET | Freq: Two times a day (BID) | ORAL | 0 refills | Status: DC | PRN
Start: 2016-12-24 — End: 2016-12-24

## 2016-12-24 MED ORDER — TRAMADOL HCL 50 MG PO TABS
50.0000 mg | ORAL_TABLET | Freq: Once | ORAL | Status: AC
  Administered 2016-12-24: 50 mg via ORAL
  Filled 2016-12-24: qty 1

## 2016-12-24 MED ORDER — TRAMADOL HCL 50 MG PO TABS: 50.0000 mg | ORAL_TABLET | Freq: Once | ORAL | Status: DC

## 2016-12-24 NOTE — Discharge Instructions (Addendum)
Take Tramadol for pain as directed.   Use the knee immobilizer and crutches to help with support.   Return to the Emergency Dept for any worsening pain, redness, swelling to the leg, fevers, or any other concerns.   If you do not have a primary care doctor you see regularly, please you the list below. Please call them to arrange for follow-up.    No Primary Care Doctor Call [place] Other agencies that provide inexpensive medical care    [place]  [phone]    [facility]  [place]  [phone]    [facility]  [phone]    [facility]  [phone]    [facility]  [phone]

## 2016-12-24 NOTE — ED Provider Notes (Signed)
[facility] DEPT Provider Note   CSN: [id number] Arrival date & time: 12/23/16  2136     History   Chief Complaint Chief Complaint  Patient presents with  . Leg Pain    HPI [name] is a 51 y.o. female who presents with right upper thigh pain and numbness that began today. Patient states that she was gardening today, which involved a lot of squatting, and at one point she felt something rip in her RLE and had immediate pain. Patient reports that pain is to the lateral aspect of RLE while numbness is to the medial aspect. She was initially able to walk after the incident but as the symptoms progressed she has had difficulty walking secondary to pain. Currently her pain is a 7/10 and is worsened with movement of RLE and walking. Patient took Robaxin and Tylenol at home with no relief of pain. She denies any weakness of extremity, new or worsening back pain that is different from her baseline, fevers. She denies any falls or trauma. She denies any bowel/bladder incontinence, saddle anesthesia, extremity weakness, fevers, night sweats, weight loss, IVDU, spinal surgery.   The history is provided by the patient.    Past Medical History:  Diagnosis Date  . Anorexia   . Anxiety   . Depression   . DJD (degenerative joint disease) of cervical spine   . Endometriosis   . ETOH abuse    sober 3 1/2 years  . Migraines   . PICC (peripherally inserted central catheter) in place    rt neck  . Psychiatric pseudoseizure   . Seizures [facility])     Patient Active Problem List   Diagnosis Date Noted  . Abdominal pain   . Suicide attempt   . Amitriptyline overdose   . Respiratory acidosis   . Prolonged Q-T interval on ECG   . Alcohol abuse   . Depression   . Anxiety state   . Bipolar I disorder, most recent episode (or current) unspecified   . Respiratory failure ([place])   . Seizure-like activity ([place]) 12/11/2014  . Suicide attempt by hanging ([place])   . MDD (major depressive disorder),  recurrent severe, without psychosis ([place]) 11/29/2014  . Panic disorder 11/29/2014  . Agoraphobia 11/29/2014  . ARF (acute renal failure) ([place]) 11/25/2014  . Intentional acetaminophen overdose ([place]) 10/26/2014  . Major depressive disorder, recurrent, severe without psychotic features ([place])   . Insomnia   . Chronic pain syndrome   . Bipolar 1 disorder, mixed, severe ([place]) 10/09/2014  . Acute blood loss anemia 10/07/2014  . Liver failure, acute   . GI bleed 10/06/2014  . Duodenal ulcer hemorrhage 10/06/2014  . Vomiting 10/05/2014  . Acute hepatitis 10/05/2014  . Acute renal insufficiency 10/05/2014  . Seizure disorder ([place]) 10/05/2014  . Hyperammonemia ([place]) 10/05/2014  . Suicidal ideation 10/05/2014  . Bipolar disorder ([place]) 10/01/2014  . Major depression 10/01/2014  . Nausea and vomiting 09/30/2014  . Diarrhea 09/30/2014  . SIRS (systemic inflammatory response syndrome) ([place]) 07/15/2014  . Tylenol overdose 07/13/2014  . Hypokalemia 07/13/2014    Past Surgical History:  Procedure Laterality Date  . ABDOMINAL HYSTERECTOMY    . ABDOMINAL SURGERY    . APPENDECTOMY    . CARPAL TUNNEL RELEASE     2010  . CHOLECYSTECTOMY    . ESOPHAGOGASTRODUODENOSCOPY N/A 10/06/2014   Procedure: ESOPHAGOGASTRODUODENOSCOPY (EGD);  Surgeon: Inda Castle, MD;  Location: [facility];  Service: Endoscopy;  Laterality: N/A;  . KNEE SURGERY    .  laproscopy    . NASAL SINUS SURGERY      OB History    No data available       Home Medications    Prior to Admission medications   Medication Sig Start Date End Date Taking? Authorizing Provider  alum & mag hydroxide-simeth (MAALOX/MYLANTA) 200-200-20 MG/5ML suspension Take 30 mLs by mouth every 6 (six) hours as needed for indigestion or heartburn. 02/06/15   Geradine Girt, DO  butalbital-acetaminophen-caffeine (FIORICET, ESGIC) 50-325-40 MG tablet Take 1 tablet by mouth every 6 (six) hours as needed for headache. 11/20/15   Shary Decamp, PA-C    Calcium Carbonate-Vitamin D (CALCIUM-VITAMIN D3 PO) Take 1 capsule by mouth daily.    Historical Provider, MD  feeding supplement, RESOURCE BREEZE, (RESOURCE BREEZE) LIQD Take 1 Container by mouth 3 (three) times daily between meals. 01/25/15   Maryann Mikhail, DO  FLUoxetine (PROZAC) 20 MG capsule Take 1 capsule (20 mg total) by mouth daily. 02/06/15   Geradine Girt, DO  folic acid (FOLVITE) 1 MG tablet Take 1 tablet (1 mg total) by mouth daily. 01/25/15   Maryann Mikhail, DO  gabapentin (NEURONTIN) 300 MG capsule Take 1 capsule (300 mg total) by mouth 3 (three) times daily. 01/25/15   Maryann Mikhail, DO  Multiple Vitamin (MULTIVITAMIN WITH MINERALS) TABS tablet Take 1 tablet by mouth daily.    Historical Provider, MD  ondansetron (ZOFRAN-ODT) 4 MG disintegrating tablet Take 1 tablet (4 mg total) by mouth every 8 (eight) hours as needed for nausea or vomiting. 02/06/15   Geradine Girt, DO  pantoprazole (PROTONIX) 40 MG tablet Take 1 tablet (40 mg total) by mouth 2 (two) times daily. 10/19/14   Knox Royalty, NP  sucralfate (CARAFATE) 1 GM/10ML suspension Take 10 mLs (1 g total) by mouth 4 (four) times daily -  with meals and at bedtime. 02/06/15   Geradine Girt, DO  thiamine 100 MG tablet Take 1 tablet (100 mg total) by mouth daily. 01/25/15   Cristal Ford, DO    Family History Family History  Problem Relation Age of Onset  . Hypertension Mother   . Hyperlipidemia Mother   . Heart failure Father   . Cancer Other     Social History Social History  Substance Use Topics  . Smoking status: Current Every Day Smoker    Packs/day: 0.25    Years: 20.00    Types: Cigarettes  . Smokeless tobacco: Never Used  . Alcohol use No     Allergies   Aspirin; Doxycycline; Imitrex [sumatriptan base]; Iodinated diagnostic agents; Lidocaine; Prednisone; Sulfa antibiotics; Sulfasalazine; Sumatriptan; Tramadol; Levofloxacin; Latex; Levofloxacin; Metrizamide; Nsaids; and Tolmetin   Review of Systems Review  of Systems  Constitutional: Negative for chills and fever.  HENT: Negative for congestion, rhinorrhea and sore throat.   Eyes: Negative for visual disturbance.  Respiratory: Negative for cough and shortness of breath.   Cardiovascular: Negative for chest pain.  Gastrointestinal: Negative for abdominal pain, blood in stool, diarrhea, nausea and vomiting.  Genitourinary: Negative for dysuria and hematuria.  Musculoskeletal: Negative for back pain and neck pain.       +RLE pain  Skin: Negative for rash.  Neurological: Positive for numbness. Negative for dizziness, weakness and headaches.  Psychiatric/Behavioral: Negative for confusion.  All other systems reviewed and are negative.    Physical Exam Updated Vital Signs BP (!) 157/97   Pulse 88   Temp 97.7 F (36.5 C) (Oral)   Resp 18   SpO2 94%  Physical Exam  Constitutional: She is oriented to person, place, and time. She appears well-developed and well-nourished.  HENT:  Head: Normocephalic and atraumatic.  Mouth/Throat: Oropharynx is clear and moist and mucous membranes are normal.  Eyes: Conjunctivae, EOM and lids are normal. Pupils are equal, round, and reactive to light.  Neck: Full passive range of motion without pain. No spinous process tenderness and no muscular tenderness present.  Cardiovascular: Normal rate, regular rhythm, normal heart sounds and normal pulses.  Exam reveals no gallop and no friction rub.   No murmur heard. Pulses:      Dorsalis pedis pulses are 2+ on the right side, and 2+ on the left side.  Pulmonary/Chest: Effort normal and breath sounds normal.  Abdominal: Soft. Normal appearance. There is no tenderness. There is no rigidity and no guarding.  Genitourinary:  Genitourinary Comments: Patient declined rectal exam to evaluate for rectal tone   Musculoskeletal: Normal range of motion.       Cervical back: She exhibits no tenderness.       Thoracic back: She exhibits no tenderness.       Lumbar  back: She exhibits no tenderness.  No deformity or crepitus noted. Patient with extreme tenderness to palpation of RLE. Limited ROM of RLE secondary to pain. Compartments soft. No edema noted to BLE.   Neurological: She is alert and oriented to person, place, and time. A sensory deficit is present.  Reflex Scores:      Patellar reflexes are 2+ on the right side and 2+ on the left side. Moving all extremities spontaneously.  5/5 strength upper extremities.  5/5 strength and dorsiflexion/plantarflexion intact of LLE. Limited exam of RLE secondary to patient's report of subjective pain. She is able to dorsiflex and plantarflex but stops because it is too painful.  Subjective numbness to the medial aspect of the lower anterior thigh, "says its feels tingly." Gait assessed and no abnormal movements but is favoring the LLE and hesitant to bear weight on RLE secondary to reports of subjective pain  Skin: Skin is warm and dry. Capillary refill takes less than 2 seconds. No rash noted.  No warmth, erythema or ecchymosis noted to RLE.  No cool extremities bilaterally.   Psychiatric: She has a normal mood and affect. Her speech is normal.  Nursing note and vitals reviewed.    ED Treatments / Results  Labs (all labs ordered are listed, but only abnormal results are displayed) Labs Reviewed - No data to display  EKG  EKG Interpretation None       Radiology No results found.  Procedures Procedures (including critical care time)  Medications Ordered in ED Medications  traMADol (ULTRAM) tablet 50 mg (50 mg Oral Given 12/24/16 0140)     Initial Impression / Assessment and Plan / ED Course  I have reviewed the triage vital signs and the nursing notes.  Pertinent labs & imaging results that were available during my care of the patient were reviewed by me and considered in my medical decision making (see chart for details).     51 yo F presents with right thigh pain and numbness to RLE  that began today after patient was squatting while doing gardening work. Pain and numbness has increased throughout the day and no improvement with tylenol or robaxin. Physical exam shows that patellar reflexes are intact. Patient has good DP pulses bilaterally. Extremity is not warm, swollen and tender and therefore suspicion for DVT is very low. DP pulses are palpable and  extremity is not cool to touch, which gives low suspicion to acute limb ischemia. Compartments or soft and history is not significant for any crush injury or trauma. Low suspicion for compartment syndrome given physical exam findings. Very low suspicion for acute fracture given no history of traumatic injury or fall. Can consider muscular strain given history of squatting. No indications for XRs at this time.   Discussed with patient that presentation does not raise high suspicion for acute life or limb threatening injury. She does have a history of back pain but states that it is not worse and there are no new changes from her normal back pain. She is nontender on exam. Reviewed options with patient. Explained that we can attempt to control her pain in the ED and discharge her with supportive assistance to help her walk and then follow-up as outpatient with orthopedics to obtain MRI of the leg. Explained that MRI leg is not done in the acute ED setting and that it would have to be arranged by outpatient provider. In doing that, we could plan to treat her pain at home with Tylenol and Robaxin. No narcotic use given her history PO opioid problems. Patient does not want to do that option since she has a history of tylenol overdose and does not currently have insurance to cover an orthopedic visit. Also explained that her symptoms could be nerve related originating from the back, though her reflexes are intact and she has no red flag symptoms that would raise suspicion for this. Explained that in order to evaluate for this we would obtain and MRI  of her back while in the department and move forward based on the results of the MRI. Patient does not wish to have this done as patient is very claustrophobic and has a history of anxiety related to MRIs. Discussed that a rectal exam can be used to assess for rectal tone that can indicate a nerve issue, but patient declined a rectal exam. Patient is agreeable to managing this in an outpatient setting if she can get her pain under control. She is hesitant to continue to take tylenol for pain given her history of overdose. Explained that given her narcotic history, I will not use opioids to treat her pain and she is in agreement for that. Patient requests a short course of tramadol to help with pain relief and a knee immobilizer and crutches for stability and support.   Patient reviewed on [place] controlled substance monitoring program, which shows no opioid prescriptions in the last year. She has recurrent Clonazepam prescriptions which she receives from her PCP. Last Tramadol use was in 2017. Discussed with patient and will plan to give her a short course of Tramadol for pain relief.   Discussed with patient the use of tramadol. She denies any allergic reaction to it and has had it before, most recently in 2017, with no issue. She denies any history of prolonged QT syndrome. She does have a history of seizures but states that she hasn't had one for several years when she was taking PO drugs. She explained that she is aware that Tramadol can lower the seizure threshold and cause QT prolongation and knowing that, she would still like to have it prescribed for pain control, as she is allergic to NSAIDs and given her history with opioids and tylenol. Records reviewed and EKG shows prolongation of QT/QTC on an EKG performed in 2016 when she came in as a potential tylenol overdose. Subsequent EKGs since then  have been within normal limits. She has had tramadol since then, most recently in 2017 with no issue. Discussed  these issues with patient and she is aware of the risk but would still like to take it.   Plan to send patient home with a knee immobilizer and crutches for support and short course of tramadol for symptomatic relief. Provided patient with a list of clinic resources to use if he does not have a PCP. Instructed to call them today to arrange follow-up in the next 24-48 hours. Also provided patient with orthopedic referral and instructed her to call their office tomorrow to arrange for outpatient follow-up. Return precautions discussed. Patient expresses understanding and agreement to plan.     Final Clinical Impressions(s) / ED Diagnoses   Final diagnoses:  Right leg pain    New Prescriptions Discharge Medication List as of 12/24/2016  1:21 AM    START taking these medications   Details  traMADol (ULTRAM) 50 MG tablet Take 1 tablet (50 mg total) by mouth every 12 (twelve) hours as needed., Starting Fri 12/24/2016, Print         Volanda Napoleon, PA-C 12/24/16 0207    Fatima Blank, MD 12/25/16 [phone]

## 2017-01-20 IMAGING — CR DG CHEST 2V
1 series · 2 of 2 positions shown · non-contrast
Comparison: None.

CLINICAL DATA: ECT workup.

EXAM:
CHEST  2 VIEW

[Series 1: dxr chest pa (or ap) and lateral · 0.14mm/px · 2 of 2 slices shown]
[im 1/2]
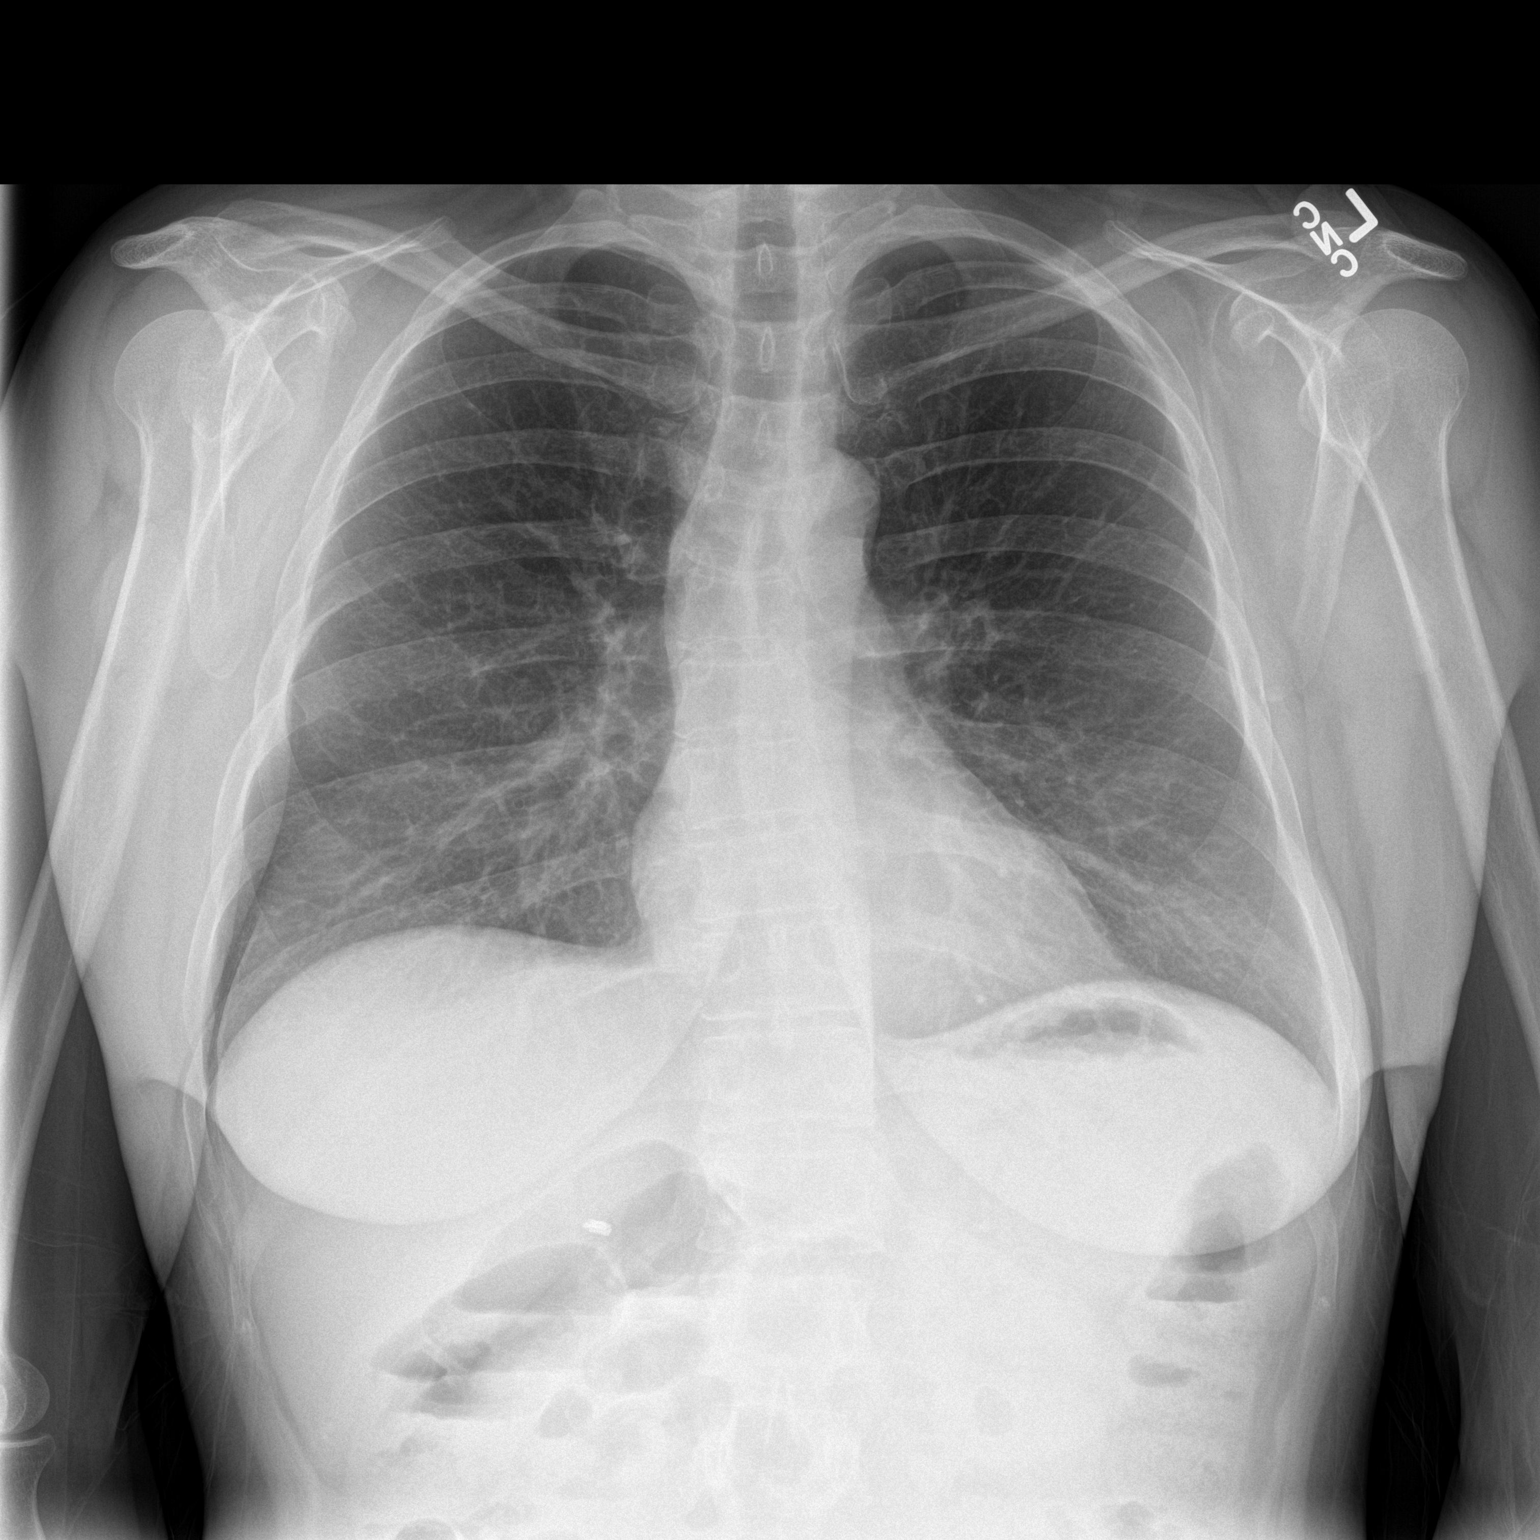
[im 2/2]
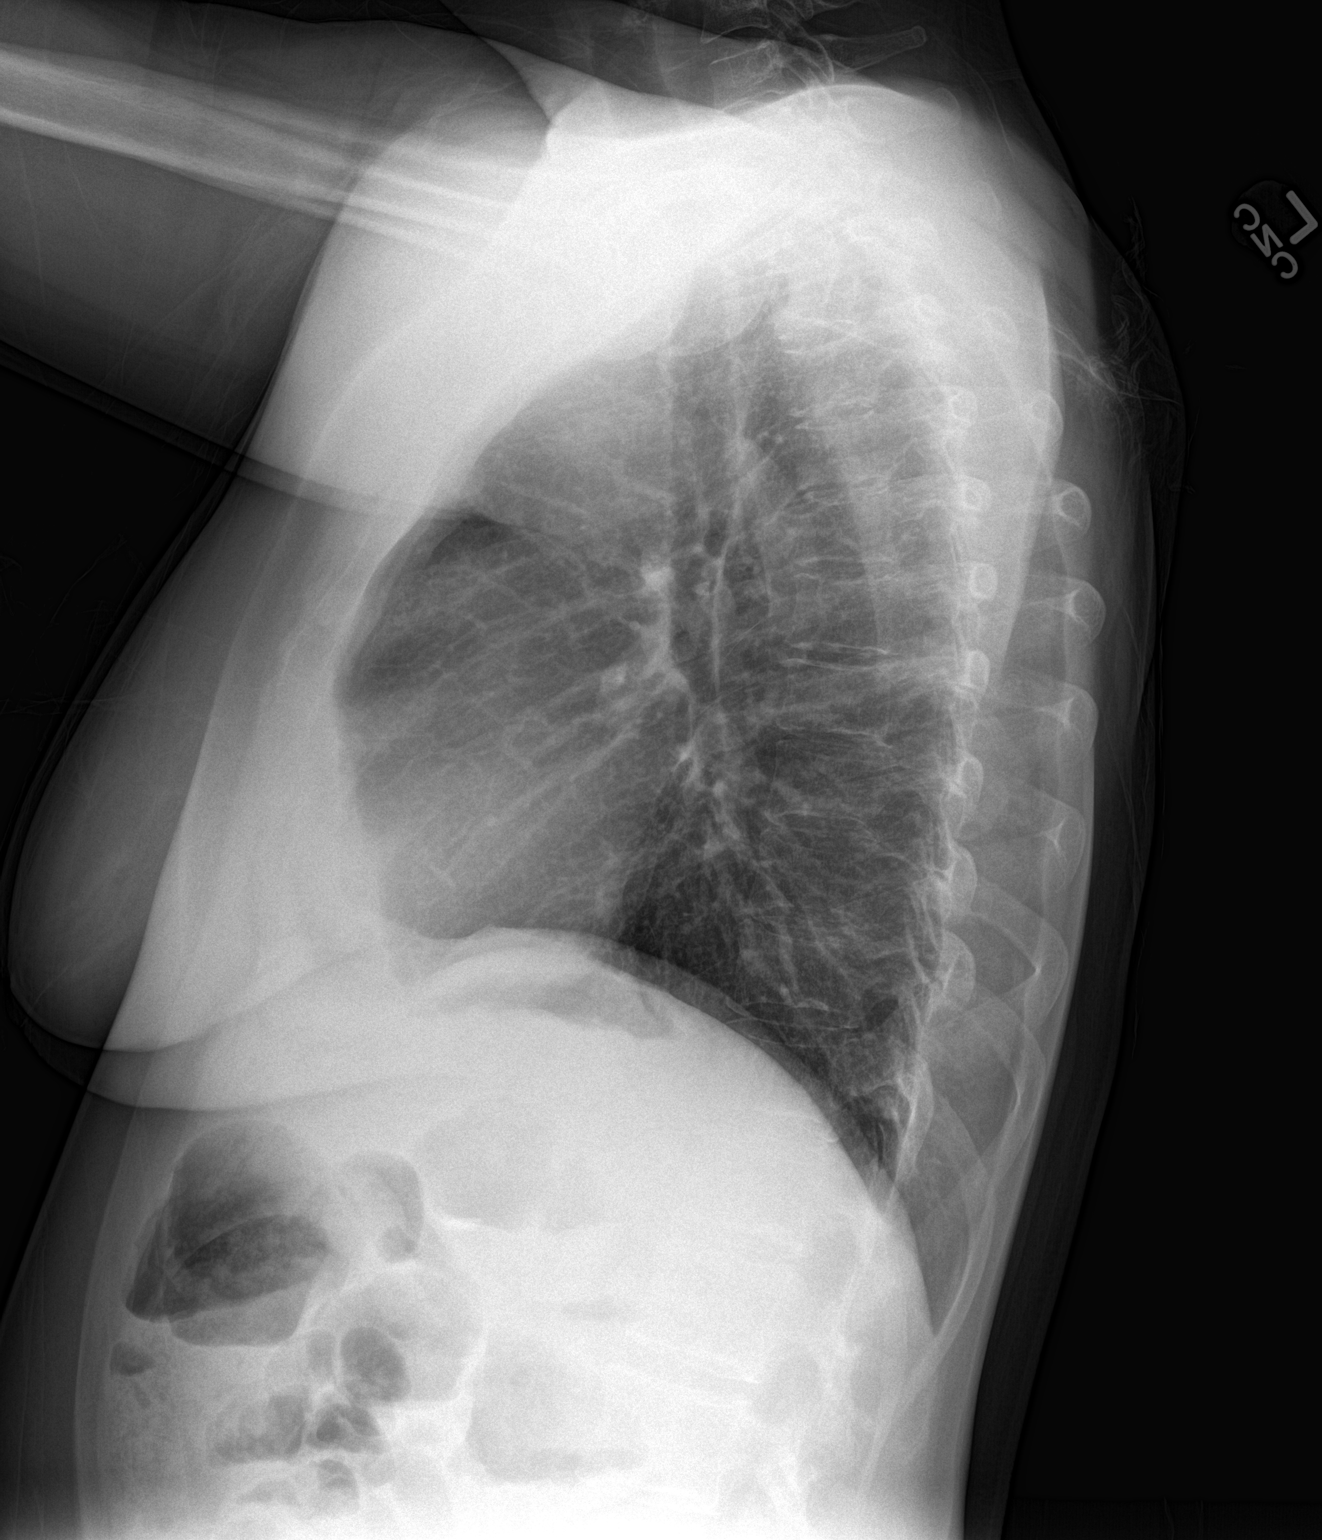

[2 of 2 positions shown; findings below may reference images not displayed]

FINDINGS: The heart size and mediastinal contours are within normal limits.
Both lungs are clear. The visualized skeletal structures are
unremarkable.
IMPRESSION: No active cardiopulmonary disease.

## 2017-03-26 ENCOUNTER — Emergency Department (HOSPITAL_COMMUNITY)
Admission: EM | Admit: 2017-03-26 | Discharge: 2017-03-27 | Disposition: A | Discharge status: Home / Self Care | Payer: Medicaid Other | Attending: Emergency Medicine | Admitting: Emergency Medicine

## 2017-03-26 ENCOUNTER — Encounter (HOSPITAL_COMMUNITY): Payer: Self-pay | Admitting: Emergency Medicine

## 2017-03-26 DIAGNOSIS — L259 Unspecified contact dermatitis, unspecified cause: Secondary | ICD-10-CM | POA: Insufficient documentation

## 2017-03-26 DIAGNOSIS — Z5321 Procedure and treatment not carried out due to patient leaving prior to being seen by health care provider: Secondary | ICD-10-CM | POA: Insufficient documentation

## 2017-03-26 NOTE — ED Triage Notes (Signed)
Patient reports itchy/dry  skin between left toes /bilateral hands for several weeks unrelieved by OTC medications .

## 2017-03-26 NOTE — ED Notes (Signed)
Pt up to desk to say that she is leaving.

## 2017-03-27 ENCOUNTER — Emergency Department (HOSPITAL_COMMUNITY)
Admission: EM | Admit: 2017-03-27 | Discharge: 2017-03-27 | Disposition: A | Discharge status: Home / Self Care | Payer: Medicaid Other | Attending: Emergency Medicine | Admitting: Emergency Medicine

## 2017-03-27 ENCOUNTER — Encounter (HOSPITAL_COMMUNITY): Payer: Self-pay

## 2017-03-27 DIAGNOSIS — L989 Disorder of the skin and subcutaneous tissue, unspecified: Secondary | ICD-10-CM | POA: Diagnosis present

## 2017-03-27 DIAGNOSIS — B86 Scabies: Secondary | ICD-10-CM | POA: Insufficient documentation

## 2017-03-27 DIAGNOSIS — B353 Tinea pedis: Secondary | ICD-10-CM

## 2017-03-27 DIAGNOSIS — Z79899 Other long term (current) drug therapy: Secondary | ICD-10-CM | POA: Diagnosis not present

## 2017-03-27 MED ORDER — PERMETHRIN 5 % EX CREA: TOPICAL_CREAM | CUTANEOUS | 1 refills | Status: DC

## 2017-03-27 MED ORDER — CLOTRIMAZOLE 1 % EX CREA
TOPICAL_CREAM | Freq: Once | CUTANEOUS | Status: AC
  Administered 2017-03-27: 1 via TOPICAL
  Filled 2017-03-27: qty 15

## 2017-03-27 MED ORDER — CLOTRIMAZOLE 1 % EX CREA: TOPICAL_CREAM | CUTANEOUS | 2 refills | Status: DC

## 2017-03-27 NOTE — ED Provider Notes (Signed)
[facility] DEPT Provider Note   CSN: [id number] Arrival date & time: 03/27/17  1141  By signing my name below, I, [name], attest that this documentation has been prepared under the direction and in the presence of non-physician practitioner, Joy, Shawn C., PA-C. Electronically Signed: Theresia [name], ED Scribe. 03/27/17. 2:01 PM.  History   Chief Complaint Chief Complaint  Patient presents with  . Wound Infection   The history is provided by the patient. No language interpreter was used.   HPI Comments: [name] is a 51 y.o. female who presents to the Emergency Department complaining of an area of cracked, painful skin to the bottom of the left foot that began 3 weeks ago. Patient states the area began itching and burning after spending time around the pool.  Patient also complains of intensely pruritic red bumps to both her hands that arose about a week ago.  She has not tried any treatments for either of these issues. She denies any drainage, fever, swelling, or any other complaints.   Past Medical History:  Diagnosis Date  . Anorexia   . Anxiety   . Depression   . DJD (degenerative joint disease) of cervical spine   . Endometriosis   . ETOH abuse    sober 3 1/2 years  . Migraines   . PICC (peripherally inserted central catheter) in place    rt neck  . Psychiatric pseudoseizure   . Seizures [facility])     Patient Active Problem List   Diagnosis Date Noted  . Abdominal pain   . Suicide attempt ([place])   . Amitriptyline overdose   . Respiratory acidosis   . Prolonged Q-T interval on ECG   . Alcohol abuse   . Depression   . Anxiety state   . Bipolar I disorder, most recent episode (or current) unspecified   . Respiratory failure ([place])   . Seizure-like activity ([place]) 12/11/2014  . Suicide attempt by hanging ([place])   . MDD (major depressive disorder), recurrent severe, without psychosis ([place]) 11/29/2014  . Panic disorder 11/29/2014  . Agoraphobia 11/29/2014    . ARF (acute renal failure) ([place]) 11/25/2014  . Intentional acetaminophen overdose ([place]) 10/26/2014  . Major depressive disorder, recurrent, severe without psychotic features ([place])   . Insomnia   . Chronic pain syndrome   . Bipolar 1 disorder, mixed, severe ([place]) 10/09/2014  . Acute blood loss anemia 10/07/2014  . Liver failure, acute   . GI bleed 10/06/2014  . Duodenal ulcer hemorrhage 10/06/2014  . Vomiting 10/05/2014  . Acute hepatitis 10/05/2014  . Acute renal insufficiency 10/05/2014  . Seizure disorder ([place]) 10/05/2014  . Hyperammonemia ([place]) 10/05/2014  . Suicidal ideation 10/05/2014  . Bipolar disorder ([place]) 10/01/2014  . Major depression 10/01/2014  . Nausea and vomiting 09/30/2014  . Diarrhea 09/30/2014  . SIRS (systemic inflammatory response syndrome) ([place]) 07/15/2014  . Tylenol overdose 07/13/2014  . Hypokalemia 07/13/2014    Past Surgical History:  Procedure Laterality Date  . ABDOMINAL HYSTERECTOMY    . ABDOMINAL SURGERY    . APPENDECTOMY    . CARPAL TUNNEL RELEASE     2010  . CHOLECYSTECTOMY    . ESOPHAGOGASTRODUODENOSCOPY N/A 10/06/2014   Procedure: ESOPHAGOGASTRODUODENOSCOPY (EGD);  Surgeon: Inda Castle, MD;  Location: [facility];  Service: Endoscopy;  Laterality: N/A;  . KNEE SURGERY    . laproscopy    . NASAL SINUS SURGERY      OB History    No data available  Home Medications    Prior to Admission medications   Medication Sig Start Date End Date Taking? Authorizing Provider  alum & mag hydroxide-simeth (MAALOX/MYLANTA) 200-200-20 MG/5ML suspension Take 30 mLs by mouth every 6 (six) hours as needed for indigestion or heartburn. 02/06/15   Geradine Girt, DO  butalbital-acetaminophen-caffeine (FIORICET, ESGIC) 50-325-40 MG tablet Take 1 tablet by mouth every 6 (six) hours as needed for headache. 11/20/15   Shary Decamp, PA-C  Calcium Carbonate-Vitamin D (CALCIUM-VITAMIN D3 PO) Take 1 capsule by mouth daily.    [provider]   clotrimazole (LOTRIMIN) 1 % cream Apply to affected area 2 times daily. Use for 4 weeks. 03/27/17   Joy, Shawn C, PA-C  feeding supplement, RESOURCE BREEZE, (RESOURCE BREEZE) LIQD Take 1 Container by mouth 3 (three) times daily between meals. 01/25/15   Mikhail, Velta Addison, DO  FLUoxetine (PROZAC) 20 MG capsule Take 1 capsule (20 mg total) by mouth daily. 02/06/15   Geradine Girt, DO  folic acid (FOLVITE) 1 MG tablet Take 1 tablet (1 mg total) by mouth daily. 01/25/15   Mikhail, Velta Addison, DO  gabapentin (NEURONTIN) 300 MG capsule Take 1 capsule (300 mg total) by mouth 3 (three) times daily. 01/25/15   Cristal Ford, DO  Multiple Vitamin (MULTIVITAMIN WITH MINERALS) TABS tablet Take 1 tablet by mouth daily.    [provider]  ondansetron (ZOFRAN-ODT) 4 MG disintegrating tablet Take 1 tablet (4 mg total) by mouth every 8 (eight) hours as needed for nausea or vomiting. 02/06/15   Geradine Girt, DO  pantoprazole (PROTONIX) 40 MG tablet Take 1 tablet (40 mg total) by mouth 2 (two) times daily. 10/19/14   Knox Royalty, NP  permethrin (ELIMITE) 5 % cream Apply to entire body. Wash off after 8-14 hours. Repeat treatment in 1 week. 03/27/17   Joy, Shawn C, PA-C  sucralfate (CARAFATE) 1 GM/10ML suspension Take 10 mLs (1 g total) by mouth 4 (four) times daily -  with meals and at bedtime. 02/06/15   Geradine Girt, DO  thiamine 100 MG tablet Take 1 tablet (100 mg total) by mouth daily. 01/25/15   Cristal Ford, DO    Family History Family History  Problem Relation Age of Onset  . Hypertension Mother   . Hyperlipidemia Mother   . Heart failure Father   . Cancer Other     Social History Social History  Substance Use Topics  . Smoking status: Current Every Day Smoker    Packs/day: 0.25    Years: 20.00    Types: Cigarettes  . Smokeless tobacco: Never Used  . Alcohol use No     Allergies   Aspirin; Doxycycline; Imitrex [sumatriptan base]; Iodinated diagnostic agents; Lidocaine; Prednisone;  Sulfa antibiotics; Sulfasalazine; Sumatriptan; Tramadol; Levofloxacin; Latex; Levofloxacin; Metrizamide; Nsaids; and Tolmetin   Review of Systems Review of Systems  Constitutional: Negative for chills and fever.  Gastrointestinal: Negative for nausea and vomiting.  Musculoskeletal: Negative for neck pain and neck stiffness.  Skin: Positive for rash and wound.  Neurological: Negative for numbness.     Physical Exam Updated Vital Signs BP (!) 144/89 (BP Location: Left Arm)   Pulse 99   Temp 98.2 F (36.8 C) (Oral)   Resp 16   Ht 5\' 2"  (1.575 m)   Wt 155 lb (70.3 kg)   SpO2 97%   BMI 28.35 kg/m   Physical Exam  Constitutional: She appears well-developed and well-nourished. No distress.  HENT:  Head: Normocephalic and atraumatic.  Eyes: Conjunctivae  are normal.  Neck: Neck supple.  Cardiovascular: Normal rate, regular rhythm and intact distal pulses.   Pulmonary/Chest: Effort normal.  Musculoskeletal: Normal range of motion. She exhibits tenderness.  Tenderness with abduction of the left 4th and 5th toes. No tenderness over the plantar fascial surface or the dorsal metatarsals. No tenderness to the digits themselves. ROM in the toes, foot and ankle are fully intact.   Neurological: She is alert.  Strength is 5/5. No noted sensory deficit. Ambulatory without gait deficit.  Skin: Skin is warm and dry. Capillary refill takes less than 2 seconds. She is not diaphoretic.  Left foot: Cracked skin with dry, tough edges to the plantar surface of the distal left foot where the 3rd - 5th digits join the metatarsal. Red, moist appearing macerated skin in between the 4th and 5th digits. No active exudate or hemorrhage.  Bilateral hands: Small, raised, erythematous lesions on the dorsal side of both hands, but primarily in between the fingers. Associated excoriations noted. No noted pustules or vesicles. No associated tenderness.  Psychiatric: She has a normal mood and affect. Her behavior  is normal.  Nursing note and vitals reviewed.    ED Treatments / Results  DIAGNOSTIC STUDIES: Oxygen Saturation is 97% on RA, normal by my interpretation.   COORDINATION OF CARE: 1:49 PM-Discussed next steps with pt including treatment with antifungal and antibiotic medications. Pt verbalized understanding and is agreeable with the plan.   Labs (all labs ordered are listed, but only abnormal results are displayed) Labs Reviewed - No data to display  EKG  EKG Interpretation None       Radiology No results found.  Procedures Procedures (including critical care time)  Medications Ordered in ED Medications  clotrimazole (LOTRIMIN) 1 % cream (1 application Topical Given 03/27/17 1433)     Initial Impression / Assessment and Plan / ED Course  I have reviewed the triage vital signs and the nursing notes.  Pertinent labs & imaging results that were available during my care of the patient were reviewed by me and considered in my medical decision making (see chart for details).     Patient presents with 2 complaints. The area on the patient's foot has the appearance of an regulated tinea pedis. The lesions on the patient's hands have the appearance of scabies. Both issues were addressed. Additional follow-up recommended and discussed. The patient was given instructions for further home care as well as return precautions. Patient voices understanding of these instructions, accepts the plan, and is comfortable with discharge.    Final Clinical Impressions(s) / ED Diagnoses   Final diagnoses:  Tinea pedis of left foot  Scabies    New Prescriptions Discharge Medication List as of 03/27/2017  2:16 PM    START taking these medications   Details  clotrimazole (LOTRIMIN) 1 % cream Apply to affected area 2 times daily. Use for 4 weeks., Print    permethrin (ELIMITE) 5 % cream Apply to entire body. Wash off after 8-14 hours. Repeat treatment in 1 week., Print       I  personally performed the services described in this documentation, which was scribed in my presence. The recorded information has been reviewed and is accurate.    Lorayne Bender, PA-C 03/29/17 1216    Virgel Manifold, MD 03/30/17 [phone]

## 2017-03-27 NOTE — ED Triage Notes (Signed)
Pt had scratch to rt sole of foot 3 weeks ago.  Now with itching and wound present.  Pt also states ?bites/rash to hands.

## 2017-03-27 NOTE — Discharge Instructions (Signed)
You have evidence of a fungal infection on the foot. Please apply this medication 2 times a day for 4 weeks. Please follow up with the foot specialist or a primary care provider on this matter for further management.   Your hands have evidence of possible scabies infection. Use the permethrin applied to the entire body. Leave on for 8-14 hours before washing off. Repeat this process in one week. May follow up with a primary care provider or dermatologist on this issue.

## 2017-07-04 DIAGNOSIS — F332 Major depressive disorder, recurrent severe without psychotic features: Secondary | ICD-10-CM | POA: Diagnosis not present

## 2017-07-18 DIAGNOSIS — F339 Major depressive disorder, recurrent, unspecified: Secondary | ICD-10-CM | POA: Diagnosis not present

## 2017-07-18 DIAGNOSIS — F332 Major depressive disorder, recurrent severe without psychotic features: Secondary | ICD-10-CM | POA: Diagnosis not present

## 2017-08-01 DIAGNOSIS — F329 Major depressive disorder, single episode, unspecified: Secondary | ICD-10-CM | POA: Diagnosis not present

## 2017-08-01 DIAGNOSIS — F332 Major depressive disorder, recurrent severe without psychotic features: Secondary | ICD-10-CM | POA: Diagnosis not present

## 2017-08-15 DIAGNOSIS — F329 Major depressive disorder, single episode, unspecified: Secondary | ICD-10-CM | POA: Diagnosis not present

## 2017-08-15 DIAGNOSIS — F332 Major depressive disorder, recurrent severe without psychotic features: Secondary | ICD-10-CM | POA: Diagnosis not present

## 2017-08-15 DIAGNOSIS — F259 Schizoaffective disorder, unspecified: Secondary | ICD-10-CM | POA: Diagnosis not present

## 2017-08-22 DIAGNOSIS — F329 Major depressive disorder, single episode, unspecified: Secondary | ICD-10-CM | POA: Diagnosis not present

## 2017-08-22 DIAGNOSIS — F259 Schizoaffective disorder, unspecified: Secondary | ICD-10-CM | POA: Diagnosis not present

## 2017-09-05 DIAGNOSIS — F339 Major depressive disorder, recurrent, unspecified: Secondary | ICD-10-CM | POA: Diagnosis not present

## 2017-09-05 DIAGNOSIS — F332 Major depressive disorder, recurrent severe without psychotic features: Secondary | ICD-10-CM | POA: Diagnosis not present

## 2017-09-19 DIAGNOSIS — F332 Major depressive disorder, recurrent severe without psychotic features: Secondary | ICD-10-CM | POA: Diagnosis not present

## 2017-09-19 DIAGNOSIS — F339 Major depressive disorder, recurrent, unspecified: Secondary | ICD-10-CM | POA: Diagnosis not present

## 2017-10-03 DIAGNOSIS — F332 Major depressive disorder, recurrent severe without psychotic features: Secondary | ICD-10-CM | POA: Diagnosis not present

## 2017-10-03 DIAGNOSIS — F329 Major depressive disorder, single episode, unspecified: Secondary | ICD-10-CM | POA: Diagnosis not present

## 2017-10-31 DIAGNOSIS — F332 Major depressive disorder, recurrent severe without psychotic features: Secondary | ICD-10-CM | POA: Diagnosis not present

## 2017-11-14 DIAGNOSIS — F332 Major depressive disorder, recurrent severe without psychotic features: Secondary | ICD-10-CM | POA: Diagnosis not present

## 2017-11-30 DIAGNOSIS — Z720 Tobacco use: Secondary | ICD-10-CM | POA: Diagnosis not present

## 2017-11-30 DIAGNOSIS — Z131 Encounter for screening for diabetes mellitus: Secondary | ICD-10-CM | POA: Diagnosis not present

## 2017-11-30 DIAGNOSIS — E669 Obesity, unspecified: Secondary | ICD-10-CM | POA: Diagnosis not present

## 2017-11-30 DIAGNOSIS — I10 Essential (primary) hypertension: Secondary | ICD-10-CM | POA: Diagnosis not present

## 2017-11-30 DIAGNOSIS — F419 Anxiety disorder, unspecified: Secondary | ICD-10-CM | POA: Diagnosis not present

## 2017-11-30 DIAGNOSIS — J302 Other seasonal allergic rhinitis: Secondary | ICD-10-CM | POA: Diagnosis not present

## 2017-11-30 DIAGNOSIS — K580 Irritable bowel syndrome with diarrhea: Secondary | ICD-10-CM | POA: Diagnosis not present

## 2017-11-30 DIAGNOSIS — F329 Major depressive disorder, single episode, unspecified: Secondary | ICD-10-CM | POA: Diagnosis not present

## 2017-12-05 DIAGNOSIS — F329 Major depressive disorder, single episode, unspecified: Secondary | ICD-10-CM | POA: Diagnosis not present

## 2017-12-05 DIAGNOSIS — F339 Major depressive disorder, recurrent, unspecified: Secondary | ICD-10-CM | POA: Diagnosis not present

## 2017-12-05 DIAGNOSIS — F332 Major depressive disorder, recurrent severe without psychotic features: Secondary | ICD-10-CM | POA: Diagnosis not present

## 2017-12-13 DIAGNOSIS — F329 Major depressive disorder, single episode, unspecified: Secondary | ICD-10-CM | POA: Diagnosis not present

## 2017-12-13 DIAGNOSIS — R7303 Prediabetes: Secondary | ICD-10-CM | POA: Diagnosis not present

## 2017-12-13 DIAGNOSIS — F419 Anxiety disorder, unspecified: Secondary | ICD-10-CM | POA: Diagnosis not present

## 2017-12-13 DIAGNOSIS — Z720 Tobacco use: Secondary | ICD-10-CM | POA: Diagnosis not present

## 2017-12-13 DIAGNOSIS — E785 Hyperlipidemia, unspecified: Secondary | ICD-10-CM | POA: Diagnosis not present

## 2017-12-13 DIAGNOSIS — E669 Obesity, unspecified: Secondary | ICD-10-CM | POA: Diagnosis not present

## 2017-12-13 DIAGNOSIS — I10 Essential (primary) hypertension: Secondary | ICD-10-CM | POA: Diagnosis not present

## 2017-12-13 DIAGNOSIS — G8929 Other chronic pain: Secondary | ICD-10-CM | POA: Diagnosis not present

## 2017-12-13 DIAGNOSIS — J302 Other seasonal allergic rhinitis: Secondary | ICD-10-CM | POA: Diagnosis not present

## 2017-12-13 DIAGNOSIS — K580 Irritable bowel syndrome with diarrhea: Secondary | ICD-10-CM | POA: Diagnosis not present

## 2017-12-26 DIAGNOSIS — Z720 Tobacco use: Secondary | ICD-10-CM | POA: Diagnosis not present

## 2017-12-26 DIAGNOSIS — J302 Other seasonal allergic rhinitis: Secondary | ICD-10-CM | POA: Diagnosis not present

## 2017-12-26 DIAGNOSIS — F329 Major depressive disorder, single episode, unspecified: Secondary | ICD-10-CM | POA: Diagnosis not present

## 2017-12-26 DIAGNOSIS — K580 Irritable bowel syndrome with diarrhea: Secondary | ICD-10-CM | POA: Diagnosis not present

## 2017-12-26 DIAGNOSIS — R7303 Prediabetes: Secondary | ICD-10-CM | POA: Diagnosis not present

## 2017-12-26 DIAGNOSIS — I10 Essential (primary) hypertension: Secondary | ICD-10-CM | POA: Diagnosis not present

## 2017-12-26 DIAGNOSIS — E669 Obesity, unspecified: Secondary | ICD-10-CM | POA: Diagnosis not present

## 2017-12-26 DIAGNOSIS — F419 Anxiety disorder, unspecified: Secondary | ICD-10-CM | POA: Diagnosis not present

## 2017-12-26 DIAGNOSIS — E785 Hyperlipidemia, unspecified: Secondary | ICD-10-CM | POA: Diagnosis not present

## 2017-12-26 DIAGNOSIS — G8929 Other chronic pain: Secondary | ICD-10-CM | POA: Diagnosis not present

## 2018-01-02 DIAGNOSIS — F332 Major depressive disorder, recurrent severe without psychotic features: Secondary | ICD-10-CM | POA: Diagnosis not present

## 2018-01-02 DIAGNOSIS — F329 Major depressive disorder, single episode, unspecified: Secondary | ICD-10-CM | POA: Diagnosis not present

## 2018-01-23 DIAGNOSIS — F329 Major depressive disorder, single episode, unspecified: Secondary | ICD-10-CM | POA: Diagnosis not present

## 2018-01-27 DIAGNOSIS — G8929 Other chronic pain: Secondary | ICD-10-CM | POA: Diagnosis not present

## 2018-01-27 DIAGNOSIS — K580 Irritable bowel syndrome with diarrhea: Secondary | ICD-10-CM | POA: Diagnosis not present

## 2018-01-27 DIAGNOSIS — F329 Major depressive disorder, single episode, unspecified: Secondary | ICD-10-CM | POA: Diagnosis not present

## 2018-01-27 DIAGNOSIS — R7303 Prediabetes: Secondary | ICD-10-CM | POA: Diagnosis not present

## 2018-01-27 DIAGNOSIS — J302 Other seasonal allergic rhinitis: Secondary | ICD-10-CM | POA: Diagnosis not present

## 2018-01-27 DIAGNOSIS — Z720 Tobacco use: Secondary | ICD-10-CM | POA: Diagnosis not present

## 2018-01-27 DIAGNOSIS — E785 Hyperlipidemia, unspecified: Secondary | ICD-10-CM | POA: Diagnosis not present

## 2018-01-27 DIAGNOSIS — Z0000 Encounter for general adult medical examination without abnormal findings: Secondary | ICD-10-CM | POA: Diagnosis not present

## 2018-01-27 DIAGNOSIS — E669 Obesity, unspecified: Secondary | ICD-10-CM | POA: Diagnosis not present

## 2018-01-27 DIAGNOSIS — F419 Anxiety disorder, unspecified: Secondary | ICD-10-CM | POA: Diagnosis not present

## 2018-01-27 DIAGNOSIS — I10 Essential (primary) hypertension: Secondary | ICD-10-CM | POA: Diagnosis not present

## 2018-02-20 DIAGNOSIS — F329 Major depressive disorder, single episode, unspecified: Secondary | ICD-10-CM | POA: Diagnosis not present

## 2018-02-20 DIAGNOSIS — F332 Major depressive disorder, recurrent severe without psychotic features: Secondary | ICD-10-CM | POA: Diagnosis not present

## 2018-03-13 DIAGNOSIS — F329 Major depressive disorder, single episode, unspecified: Secondary | ICD-10-CM | POA: Diagnosis not present

## 2018-03-13 DIAGNOSIS — F332 Major depressive disorder, recurrent severe without psychotic features: Secondary | ICD-10-CM | POA: Diagnosis not present

## 2018-03-13 DIAGNOSIS — F3163 Bipolar disorder, current episode mixed, severe, without psychotic features: Secondary | ICD-10-CM | POA: Diagnosis not present

## 2018-04-10 DIAGNOSIS — F329 Major depressive disorder, single episode, unspecified: Secondary | ICD-10-CM | POA: Diagnosis not present

## 2018-04-10 DIAGNOSIS — Z9889 Other specified postprocedural states: Secondary | ICD-10-CM | POA: Diagnosis not present

## 2018-05-08 DIAGNOSIS — F332 Major depressive disorder, recurrent severe without psychotic features: Secondary | ICD-10-CM | POA: Diagnosis not present

## 2018-05-08 DIAGNOSIS — F329 Major depressive disorder, single episode, unspecified: Secondary | ICD-10-CM | POA: Diagnosis not present

## 2018-05-16 DIAGNOSIS — J302 Other seasonal allergic rhinitis: Secondary | ICD-10-CM | POA: Diagnosis not present

## 2018-05-16 DIAGNOSIS — E785 Hyperlipidemia, unspecified: Secondary | ICD-10-CM | POA: Diagnosis not present

## 2018-05-16 DIAGNOSIS — F329 Major depressive disorder, single episode, unspecified: Secondary | ICD-10-CM | POA: Diagnosis not present

## 2018-05-16 DIAGNOSIS — E669 Obesity, unspecified: Secondary | ICD-10-CM | POA: Diagnosis not present

## 2018-05-16 DIAGNOSIS — Z131 Encounter for screening for diabetes mellitus: Secondary | ICD-10-CM | POA: Diagnosis not present

## 2018-05-16 DIAGNOSIS — G8929 Other chronic pain: Secondary | ICD-10-CM | POA: Diagnosis not present

## 2018-05-16 DIAGNOSIS — F419 Anxiety disorder, unspecified: Secondary | ICD-10-CM | POA: Diagnosis not present

## 2018-05-16 DIAGNOSIS — R7303 Prediabetes: Secondary | ICD-10-CM | POA: Diagnosis not present

## 2018-05-16 DIAGNOSIS — I10 Essential (primary) hypertension: Secondary | ICD-10-CM | POA: Diagnosis not present

## 2018-05-16 DIAGNOSIS — K580 Irritable bowel syndrome with diarrhea: Secondary | ICD-10-CM | POA: Diagnosis not present

## 2018-05-16 DIAGNOSIS — Z720 Tobacco use: Secondary | ICD-10-CM | POA: Diagnosis not present

## 2018-05-24 ENCOUNTER — Emergency Department (HOSPITAL_COMMUNITY)
Admission: EM | Admit: 2018-05-24 | Discharge: 2018-05-24 | Disposition: A | Discharge status: Home / Self Care | Payer: Medicare Other | Attending: Emergency Medicine | Admitting: Emergency Medicine

## 2018-05-24 ENCOUNTER — Other Ambulatory Visit: Payer: Self-pay

## 2018-05-24 ENCOUNTER — Encounter (HOSPITAL_COMMUNITY): Payer: Self-pay | Admitting: Family Medicine

## 2018-05-24 ENCOUNTER — Emergency Department (HOSPITAL_COMMUNITY): Payer: Medicare Other

## 2018-05-24 DIAGNOSIS — W01198D Fall on same level from slipping, tripping and stumbling with subsequent striking against other object, subsequent encounter: Secondary | ICD-10-CM | POA: Diagnosis not present

## 2018-05-24 DIAGNOSIS — F319 Bipolar disorder, unspecified: Secondary | ICD-10-CM | POA: Diagnosis not present

## 2018-05-24 DIAGNOSIS — I10 Essential (primary) hypertension: Secondary | ICD-10-CM | POA: Diagnosis not present

## 2018-05-24 DIAGNOSIS — Y92009 Unspecified place in unspecified non-institutional (private) residence as the place of occurrence of the external cause: Secondary | ICD-10-CM | POA: Diagnosis not present

## 2018-05-24 DIAGNOSIS — F329 Major depressive disorder, single episode, unspecified: Secondary | ICD-10-CM | POA: Diagnosis not present

## 2018-05-24 DIAGNOSIS — S2231XA Fracture of one rib, right side, initial encounter for closed fracture: Secondary | ICD-10-CM | POA: Insufficient documentation

## 2018-05-24 DIAGNOSIS — G8929 Other chronic pain: Secondary | ICD-10-CM | POA: Diagnosis not present

## 2018-05-24 DIAGNOSIS — F419 Anxiety disorder, unspecified: Secondary | ICD-10-CM | POA: Diagnosis not present

## 2018-05-24 DIAGNOSIS — E785 Hyperlipidemia, unspecified: Secondary | ICD-10-CM | POA: Diagnosis not present

## 2018-05-24 DIAGNOSIS — Y93E1 Activity, personal bathing and showering: Secondary | ICD-10-CM | POA: Insufficient documentation

## 2018-05-24 DIAGNOSIS — Z91040 Latex allergy status: Secondary | ICD-10-CM | POA: Diagnosis not present

## 2018-05-24 DIAGNOSIS — E669 Obesity, unspecified: Secondary | ICD-10-CM | POA: Diagnosis not present

## 2018-05-24 DIAGNOSIS — S299XXA Unspecified injury of thorax, initial encounter: Secondary | ICD-10-CM | POA: Diagnosis not present

## 2018-05-24 DIAGNOSIS — F17210 Nicotine dependence, cigarettes, uncomplicated: Secondary | ICD-10-CM | POA: Diagnosis not present

## 2018-05-24 DIAGNOSIS — G8911 Acute pain due to trauma: Secondary | ICD-10-CM | POA: Diagnosis present

## 2018-05-24 DIAGNOSIS — K580 Irritable bowel syndrome with diarrhea: Secondary | ICD-10-CM | POA: Diagnosis not present

## 2018-05-24 DIAGNOSIS — R0781 Pleurodynia: Secondary | ICD-10-CM | POA: Diagnosis not present

## 2018-05-24 DIAGNOSIS — Y999 Unspecified external cause status: Secondary | ICD-10-CM | POA: Insufficient documentation

## 2018-05-24 DIAGNOSIS — Z79899 Other long term (current) drug therapy: Secondary | ICD-10-CM | POA: Insufficient documentation

## 2018-05-24 DIAGNOSIS — Z720 Tobacco use: Secondary | ICD-10-CM | POA: Diagnosis not present

## 2018-05-24 DIAGNOSIS — R7303 Prediabetes: Secondary | ICD-10-CM | POA: Diagnosis not present

## 2018-05-24 DIAGNOSIS — J302 Other seasonal allergic rhinitis: Secondary | ICD-10-CM | POA: Diagnosis not present

## 2018-05-24 DIAGNOSIS — Z131 Encounter for screening for diabetes mellitus: Secondary | ICD-10-CM | POA: Diagnosis not present

## 2018-05-24 MED ORDER — HYDROCODONE-ACETAMINOPHEN 5-325 MG PO TABS: 1.0000 | ORAL_TABLET | ORAL | 0 refills | Status: DC | PRN

## 2018-05-24 NOTE — ED Provider Notes (Signed)
[facility] DEPT Provider Note   CSN: [id number] Arrival date & time: 05/24/18  1313     History   Chief Complaint Chief Complaint  Patient presents with  . Rib Fracture    HPI [name] is a 52 y.o. female.  HPI Patient presents with right posterior rib pain after fall 3-1/2 weeks ago.  Reportedly saw PCP a couple weeks ago and did not have x-ray done because they assumed was broken ribs.  Has been taking Tylenol and other patches at home without relief.  States she saw her primary care doctor today who did an x-ray that reportedly showed rib fractures and sent in here to get an orthopedic consult because would be difficult to get done as an outpatient.  Pain with movements.  She states she had the severe tearing pain when she sneezed after her fall.  Continued pain with movement.  Previous opiate abuse.  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  No real cough.  No real shortness of breath. Past Medical History:  Diagnosis Date  . Anorexia   . Anxiety   . Depression   . DJD (degenerative joint disease) of cervical spine   . Endometriosis   . ETOH abuse    sober 3 1/2 years  . Migraines   . PICC (peripherally inserted central catheter) in place    rt neck  . Psychiatric pseudoseizure   . Seizures [facility])     Patient Active Problem List   Diagnosis Date Noted  . Abdominal pain   . Suicide attempt ([place])   . Amitriptyline overdose   . Respiratory acidosis   . Prolonged Q-T interval on ECG   . Alcohol abuse   . Depression   . Anxiety state   . Bipolar I disorder, most recent episode (or current) unspecified   . Respiratory failure ([place])   . Seizure-like activity ([place]) 12/11/2014  . Suicide attempt by hanging ([place])   . MDD (major depressive disorder), recurrent severe, without psychosis ([place]) 11/29/2014  . Panic disorder 11/29/2014  . Agoraphobia 11/29/2014  . ARF (acute renal failure)  ([place]) 11/25/2014  . Intentional acetaminophen overdose ([place]) 10/26/2014  . Major depressive disorder, recurrent, severe without psychotic features ([place])   . Insomnia   . Chronic pain syndrome   . Bipolar 1 disorder, mixed, severe ([place]) 10/09/2014  . Acute blood loss anemia 10/07/2014  . Liver failure, acute   . GI bleed 10/06/2014  . Duodenal ulcer hemorrhage 10/06/2014  . Vomiting 10/05/2014  . Acute hepatitis 10/05/2014  . Acute renal insufficiency 10/05/2014  . Seizure disorder ([place]) 10/05/2014  . Hyperammonemia ([place]) 10/05/2014  . Suicidal ideation 10/05/2014  . Bipolar disorder ([place]) 10/01/2014  . Major depression 10/01/2014  . Nausea and vomiting 09/30/2014  . Diarrhea 09/30/2014  . SIRS (systemic inflammatory response syndrome) ([place]) 07/15/2014  . Tylenol overdose 07/13/2014  . Hypokalemia 07/13/2014    Past Surgical History:  Procedure Laterality Date  . ABDOMINAL HYSTERECTOMY    . ABDOMINAL SURGERY    . APPENDECTOMY    . CARPAL TUNNEL RELEASE     2010  . CHOLECYSTECTOMY    . ESOPHAGOGASTRODUODENOSCOPY N/A 10/06/2014   Procedure: ESOPHAGOGASTRODUODENOSCOPY (EGD);  Surgeon: Inda Castle, MD;  Location: [facility];  Service: Endoscopy;  Laterality: N/A;  . KNEE SURGERY    . laproscopy    . NASAL SINUS SURGERY       OB History   None  Home Medications    Prior to Admission medications   Medication Sig Start Date End Date Taking? Authorizing Provider  acetaminophen (TYLENOL) 500 MG tablet Take 500-1,000 mg by mouth every 6 (six) hours as needed for moderate pain.   Yes [provider]  amitriptyline (ELAVIL) 25 MG tablet Take 25 mg by mouth 2 (two) times daily.   Yes [provider]  amLODipine (NORVASC) 10 MG tablet Take 10 mg by mouth daily.   Yes [provider]  carisoprodol (SOMA) 350 MG tablet Take 350 mg by mouth 3 (three) times daily as needed for muscle spasms.   Yes [provider]  clonazePAM (KLONOPIN)  0.5 MG tablet Take 0.5 mg by mouth 3 (three) times daily.   Yes [provider]  dicyclomine (BENTYL) 20 MG tablet Take 20 mg by mouth 4 (four) times daily as needed for spasms.   Yes [provider]  fluticasone (FLONASE) 50 MCG/ACT nasal spray Place 1 spray into both nostrils daily as needed for allergies or rhinitis.   Yes [provider]  hydrochlorothiazide (MICROZIDE) 12.5 MG capsule Take 12.5 mg by mouth daily.   Yes [provider]  mirtazapine (REMERON) 15 MG tablet Take 15 mg by mouth at bedtime.   Yes [provider]  Probiotic Product (PROBIOTIC-10) CAPS Take 1 capsule by mouth daily.   Yes [provider]  rosuvastatin (CRESTOR) 20 MG tablet Take 20 mg by mouth at bedtime.   Yes [provider]  alum & mag hydroxide-simeth (MAALOX/MYLANTA) 200-200-20 MG/5ML suspension Take 30 mLs by mouth every 6 (six) hours as needed for indigestion or heartburn. Patient not taking: Reported on 05/24/2018 02/06/15   Geradine Girt, DO  butalbital-acetaminophen-caffeine (FIORICET, ESGIC) [phone] MG tablet Take 1 tablet by mouth every 6 (six) hours as needed for headache. Patient not taking: Reported on 05/24/2018 11/20/15   Shary Decamp, PA-C  clotrimazole (LOTRIMIN) 1 % cream Apply to affected area 2 times daily. Use for 4 weeks. Patient not taking: Reported on 05/24/2018 03/27/17   Joy, Shawn C, PA-C  feeding supplement, RESOURCE BREEZE, (RESOURCE BREEZE) LIQD Take 1 Container by mouth 3 (three) times daily between meals. Patient not taking: Reported on 05/24/2018 01/25/15   Cristal Ford, DO  FLUoxetine (PROZAC) 20 MG capsule Take 1 capsule (20 mg total) by mouth daily. Patient not taking: Reported on 05/24/2018 02/06/15   Geradine Girt, DO  folic acid (FOLVITE) 1 MG tablet Take 1 tablet (1 mg total) by mouth daily. Patient not taking: Reported on 05/24/2018 01/25/15   Cristal Ford, DO  gabapentin (NEURONTIN) 300 MG capsule Take 1 capsule (300 mg  total) by mouth 3 (three) times daily. Patient not taking: Reported on 05/24/2018 01/25/15   Cristal Ford, DO  HYDROcodone-acetaminophen (NORCO/VICODIN) 5-325 MG tablet Take 1-2 tablets by mouth every 4 (four) hours as needed. 05/24/18   Davonna Belling, MD  ondansetron (ZOFRAN-ODT) 4 MG disintegrating tablet Take 1 tablet (4 mg total) by mouth every 8 (eight) hours as needed for nausea or vomiting. Patient not taking: Reported on 05/24/2018 02/06/15   Geradine Girt, DO  pantoprazole (PROTONIX) 40 MG tablet Take 1 tablet (40 mg total) by mouth 2 (two) times daily. Patient not taking: Reported on 05/24/2018 10/19/14   Knox Royalty, NP  permethrin (ELIMITE) 5 % cream Apply to entire body. Wash off after 8-14 hours. Repeat treatment in 1 week. Patient not taking: Reported on 05/24/2018 03/27/17   Lorayne Bender, PA-C  sucralfate (CARAFATE) 1 GM/10ML suspension Take 10 mLs (1 g total) by mouth 4 (four) times daily -  with meals and at bedtime. Patient not taking: Reported on 05/24/2018 02/06/15   Geradine Girt, DO  thiamine 100 MG tablet Take 1 tablet (100 mg total) by mouth daily. Patient not taking: Reported on 05/24/2018 01/25/15   Cristal Ford, DO    Family History Family History  Problem Relation Age of Onset  . Hypertension Mother   . Hyperlipidemia Mother   . Heart failure Father   . Cancer Other     Social History Social History   Tobacco Use  . Smoking status: Current Every Day Smoker    Packs/day: 0.25    Years: 20.00    Pack years: 5.00    Types: Cigarettes  . Smokeless tobacco: Never Used  Substance Use Topics  . Alcohol use: No  . Drug use: No     Allergies   Aspirin; Doxycycline; Imitrex [sumatriptan base]; Iodinated diagnostic agents; Lidocaine; Prednisone; Sulfa antibiotics; Sulfasalazine; Sumatriptan; Tramadol; Levofloxacin; Latex; Levofloxacin; Metrizamide; Nsaids; and Tolmetin   Review of Systems Review of Systems  Constitutional: Negative for appetite change.    HENT: Negative for congestion.   Respiratory: Negative for cough and shortness of breath.   Cardiovascular: Positive for chest pain.  Genitourinary: Positive for flank pain.  Musculoskeletal: Positive for back pain.  Skin: Negative for wound.  Neurological: Negative for weakness.     Physical Exam Updated Vital Signs BP (!) 137/103   Pulse 94   Temp 98.7 F (37.1 C) (Oral)   Resp 16   Ht 5\' 2"  (1.575 m)   Wt 81.6 kg   SpO2 96%   BMI 32.92 kg/m   Physical Exam  Constitutional: She appears well-developed.  HENT:  Head: Normocephalic.  Eyes: Pupils are equal, round, and reactive to light.  Neck: Neck supple.  Cardiovascular: Normal rate.  Pulmonary/Chest: She exhibits tenderness.  Tender right lower posterior chest wall.  Some area of bruising but no subcu emphysema or crepitance.  Abdominal: There is no tenderness.  Musculoskeletal: She exhibits no tenderness.  Neurological: She is alert.  Skin: Skin is warm. Capillary refill takes less than 2 seconds.  Psychiatric: She has a normal mood and affect.     ED Treatments / Results  Labs (all labs ordered are listed, but only abnormal results are displayed) Labs Reviewed - No data to display  EKG None  Radiology Dg Ribs Unilateral W/chest Right  Result Date: 05/24/2018 CLINICAL DATA:  52 year old female status post fall 3 weeks ago. Cough today with abrupt onset right lower rib pain. EXAM: RIGHT RIBS AND CHEST - 3+ VIEW COMPARISON:  Chest radiographs 01/22/2015 and earlier. FINDINGS: Right chest porta cath in place, power port. Mild cardiomegaly has developed since 2016. Other mediastinal contours are within normal limits. Visualized tracheal air column is within normal limits. Mildly lower lung volumes. Mild diffuse increased pulmonary interstitial opacity, but might reflect crowding. No pneumothorax, pleural effusion or consolidation. Chronic cholecystectomy clips in the right upper quadrant. A right lower rib marker is  at the anterior right 11/12 rib level. There is evidence of a nondisplaced right 11th rib fracture on image 2. No other right rib fracture identified. The other visible osseous structures appear intact. IMPRESSION: 1. Suspect nondisplaced right 11th rib fracture. 2. Mild diffuse pulmonary interstitial opacity is nonspecific. Differential considerations include viral/atypical respiratory infection, mild pulmonary vascular congestion, atelectasis. 3. Mild cardiomegaly since 2016. Electronically Signed   By:  Genevie Ann M.D.   On: 05/24/2018 18:50    Procedures Procedures (including critical care time)  Medications Ordered in ED Medications - No data to display   Initial Impression / Assessment and Plan / ED Course  I have reviewed the triage vital signs and the nursing notes.  Pertinent labs & imaging results that were available during my care of the patient were reviewed by me and considered in my medical decision making (see chart for details).     Patient with rib fracture.  From 3 weeks ago.  X-ray otherwise reassuring.  Does have some chronic lung changes.  Patient states she is smoking less.  Discussed pain medicines with patient.  Will give short prescription.  Aware of risks particularly with her substance abuse history.  Will discharge home.  [facility] drug database reviewed per  Final Clinical Impressions(s) / ED Diagnoses   Final diagnoses:  Closed fracture of one rib of right side, initial encounter    ED Discharge Orders         Ordered    HYDROcodone-acetaminophen (NORCO/VICODIN) 5-325 MG tablet  Every 4 hours PRN     05/24/18 2047           Davonna Belling, MD 05/24/18 2054

## 2018-05-24 NOTE — ED Triage Notes (Signed)
Patient reports she was seen by Hayes Ludwig, PA; referred at the emergency department for 2 right rib fractures. Patient reports 3.5 weeks ago, she fell over the side of the tub from slipping on conditioner. She reports having an x-ray performed today.

## 2018-06-05 DIAGNOSIS — F329 Major depressive disorder, single episode, unspecified: Secondary | ICD-10-CM | POA: Diagnosis not present

## 2018-06-05 DIAGNOSIS — F332 Major depressive disorder, recurrent severe without psychotic features: Secondary | ICD-10-CM | POA: Diagnosis not present

## 2018-07-10 DIAGNOSIS — F332 Major depressive disorder, recurrent severe without psychotic features: Secondary | ICD-10-CM | POA: Diagnosis not present

## 2018-07-31 DIAGNOSIS — F332 Major depressive disorder, recurrent severe without psychotic features: Secondary | ICD-10-CM | POA: Diagnosis not present

## 2018-08-21 DIAGNOSIS — F332 Major depressive disorder, recurrent severe without psychotic features: Secondary | ICD-10-CM | POA: Diagnosis not present

## 2018-09-26 DIAGNOSIS — R0781 Pleurodynia: Secondary | ICD-10-CM | POA: Diagnosis not present

## 2018-09-26 DIAGNOSIS — G8929 Other chronic pain: Secondary | ICD-10-CM | POA: Diagnosis not present

## 2018-09-26 DIAGNOSIS — K580 Irritable bowel syndrome with diarrhea: Secondary | ICD-10-CM | POA: Diagnosis not present

## 2018-09-26 DIAGNOSIS — R7303 Prediabetes: Secondary | ICD-10-CM | POA: Diagnosis not present

## 2018-09-26 DIAGNOSIS — E669 Obesity, unspecified: Secondary | ICD-10-CM | POA: Diagnosis not present

## 2018-09-26 DIAGNOSIS — J302 Other seasonal allergic rhinitis: Secondary | ICD-10-CM | POA: Diagnosis not present

## 2018-09-26 DIAGNOSIS — I10 Essential (primary) hypertension: Secondary | ICD-10-CM | POA: Diagnosis not present

## 2018-09-26 DIAGNOSIS — F329 Major depressive disorder, single episode, unspecified: Secondary | ICD-10-CM | POA: Diagnosis not present

## 2018-09-26 DIAGNOSIS — Z720 Tobacco use: Secondary | ICD-10-CM | POA: Diagnosis not present

## 2018-09-26 DIAGNOSIS — Z131 Encounter for screening for diabetes mellitus: Secondary | ICD-10-CM | POA: Diagnosis not present

## 2018-09-26 DIAGNOSIS — F419 Anxiety disorder, unspecified: Secondary | ICD-10-CM | POA: Diagnosis not present

## 2018-09-26 DIAGNOSIS — E785 Hyperlipidemia, unspecified: Secondary | ICD-10-CM | POA: Diagnosis not present

## 2018-10-02 ENCOUNTER — Other Ambulatory Visit: Payer: Self-pay | Admitting: Physician Assistant

## 2018-10-02 DIAGNOSIS — R5381 Other malaise: Secondary | ICD-10-CM

## 2018-10-02 DIAGNOSIS — F332 Major depressive disorder, recurrent severe without psychotic features: Secondary | ICD-10-CM | POA: Diagnosis not present

## 2018-10-19 DIAGNOSIS — F4310 Post-traumatic stress disorder, unspecified: Secondary | ICD-10-CM | POA: Diagnosis not present

## 2018-10-19 DIAGNOSIS — F17210 Nicotine dependence, cigarettes, uncomplicated: Secondary | ICD-10-CM | POA: Diagnosis not present

## 2018-10-19 DIAGNOSIS — F332 Major depressive disorder, recurrent severe without psychotic features: Secondary | ICD-10-CM | POA: Diagnosis not present

## 2018-11-14 DIAGNOSIS — J302 Other seasonal allergic rhinitis: Secondary | ICD-10-CM | POA: Diagnosis not present

## 2018-11-14 DIAGNOSIS — F419 Anxiety disorder, unspecified: Secondary | ICD-10-CM | POA: Diagnosis not present

## 2018-11-14 DIAGNOSIS — I10 Essential (primary) hypertension: Secondary | ICD-10-CM | POA: Diagnosis not present

## 2018-11-14 DIAGNOSIS — Z720 Tobacco use: Secondary | ICD-10-CM | POA: Diagnosis not present

## 2018-11-14 DIAGNOSIS — R0781 Pleurodynia: Secondary | ICD-10-CM | POA: Diagnosis not present

## 2018-11-14 DIAGNOSIS — E669 Obesity, unspecified: Secondary | ICD-10-CM | POA: Diagnosis not present

## 2018-11-14 DIAGNOSIS — G8929 Other chronic pain: Secondary | ICD-10-CM | POA: Diagnosis not present

## 2018-11-14 DIAGNOSIS — K580 Irritable bowel syndrome with diarrhea: Secondary | ICD-10-CM | POA: Diagnosis not present

## 2018-11-14 DIAGNOSIS — R05 Cough: Secondary | ICD-10-CM | POA: Diagnosis not present

## 2018-11-14 DIAGNOSIS — E785 Hyperlipidemia, unspecified: Secondary | ICD-10-CM | POA: Diagnosis not present

## 2018-11-14 DIAGNOSIS — R7303 Prediabetes: Secondary | ICD-10-CM | POA: Diagnosis not present

## 2018-11-14 DIAGNOSIS — F329 Major depressive disorder, single episode, unspecified: Secondary | ICD-10-CM | POA: Diagnosis not present

## 2019-01-24 DIAGNOSIS — F329 Major depressive disorder, single episode, unspecified: Secondary | ICD-10-CM | POA: Diagnosis not present

## 2019-01-24 DIAGNOSIS — G8929 Other chronic pain: Secondary | ICD-10-CM | POA: Diagnosis not present

## 2019-01-24 DIAGNOSIS — J302 Other seasonal allergic rhinitis: Secondary | ICD-10-CM | POA: Diagnosis not present

## 2019-01-24 DIAGNOSIS — I10 Essential (primary) hypertension: Secondary | ICD-10-CM | POA: Diagnosis not present

## 2019-01-24 DIAGNOSIS — R05 Cough: Secondary | ICD-10-CM | POA: Diagnosis not present

## 2019-01-24 DIAGNOSIS — K580 Irritable bowel syndrome with diarrhea: Secondary | ICD-10-CM | POA: Diagnosis not present

## 2019-01-24 DIAGNOSIS — Z720 Tobacco use: Secondary | ICD-10-CM | POA: Diagnosis not present

## 2019-01-24 DIAGNOSIS — R7303 Prediabetes: Secondary | ICD-10-CM | POA: Diagnosis not present

## 2019-01-24 DIAGNOSIS — R0781 Pleurodynia: Secondary | ICD-10-CM | POA: Diagnosis not present

## 2019-01-24 DIAGNOSIS — E669 Obesity, unspecified: Secondary | ICD-10-CM | POA: Diagnosis not present

## 2019-01-24 DIAGNOSIS — F419 Anxiety disorder, unspecified: Secondary | ICD-10-CM | POA: Diagnosis not present

## 2019-01-24 DIAGNOSIS — E785 Hyperlipidemia, unspecified: Secondary | ICD-10-CM | POA: Diagnosis not present

## 2019-01-30 ENCOUNTER — Emergency Department (HOSPITAL_COMMUNITY): Payer: Medicare Other

## 2019-01-30 ENCOUNTER — Other Ambulatory Visit: Payer: Self-pay

## 2019-01-30 ENCOUNTER — Inpatient Hospital Stay (HOSPITAL_COMMUNITY)
Admission: EM | Admit: 2019-01-30 | Discharge: 2019-02-05 | DRG: 291 | Disposition: A | Discharge status: Home Health | Payer: Medicare Other | Attending: Family Medicine | Admitting: Family Medicine

## 2019-01-30 ENCOUNTER — Encounter (HOSPITAL_COMMUNITY): Payer: Self-pay

## 2019-01-30 DIAGNOSIS — R0602 Shortness of breath: Secondary | ICD-10-CM | POA: Diagnosis present

## 2019-01-30 DIAGNOSIS — Z886 Allergy status to analgesic agent status: Secondary | ICD-10-CM

## 2019-01-30 DIAGNOSIS — R0902 Hypoxemia: Secondary | ICD-10-CM

## 2019-01-30 DIAGNOSIS — J449 Chronic obstructive pulmonary disease, unspecified: Secondary | ICD-10-CM | POA: Diagnosis present

## 2019-01-30 DIAGNOSIS — F329 Major depressive disorder, single episode, unspecified: Secondary | ICD-10-CM | POA: Diagnosis present

## 2019-01-30 DIAGNOSIS — R51 Headache: Secondary | ICD-10-CM | POA: Diagnosis not present

## 2019-01-30 DIAGNOSIS — G43909 Migraine, unspecified, not intractable, without status migrainosus: Secondary | ICD-10-CM | POA: Diagnosis present

## 2019-01-30 DIAGNOSIS — F17210 Nicotine dependence, cigarettes, uncomplicated: Secondary | ICD-10-CM | POA: Diagnosis present

## 2019-01-30 DIAGNOSIS — R9431 Abnormal electrocardiogram [ECG] [EKG]: Secondary | ICD-10-CM | POA: Diagnosis present

## 2019-01-30 DIAGNOSIS — E876 Hypokalemia: Secondary | ICD-10-CM | POA: Diagnosis present

## 2019-01-30 DIAGNOSIS — B349 Viral infection, unspecified: Secondary | ICD-10-CM | POA: Diagnosis not present

## 2019-01-30 DIAGNOSIS — Z20828 Contact with and (suspected) exposure to other viral communicable diseases: Secondary | ICD-10-CM | POA: Diagnosis present

## 2019-01-30 DIAGNOSIS — Z881 Allergy status to other antibiotic agents status: Secondary | ICD-10-CM

## 2019-01-30 DIAGNOSIS — I5031 Acute diastolic (congestive) heart failure: Secondary | ICD-10-CM | POA: Diagnosis present

## 2019-01-30 DIAGNOSIS — D72829 Elevated white blood cell count, unspecified: Secondary | ICD-10-CM | POA: Diagnosis not present

## 2019-01-30 DIAGNOSIS — G43009 Migraine without aura, not intractable, without status migrainosus: Secondary | ICD-10-CM | POA: Diagnosis not present

## 2019-01-30 DIAGNOSIS — Z8249 Family history of ischemic heart disease and other diseases of the circulatory system: Secondary | ICD-10-CM

## 2019-01-30 DIAGNOSIS — R0609 Other forms of dyspnea: Secondary | ICD-10-CM | POA: Diagnosis present

## 2019-01-30 DIAGNOSIS — Z90710 Acquired absence of both cervix and uterus: Secondary | ICD-10-CM

## 2019-01-30 DIAGNOSIS — I110 Hypertensive heart disease with heart failure: Secondary | ICD-10-CM | POA: Diagnosis not present

## 2019-01-30 DIAGNOSIS — R112 Nausea with vomiting, unspecified: Secondary | ICD-10-CM | POA: Diagnosis present

## 2019-01-30 DIAGNOSIS — E785 Hyperlipidemia, unspecified: Secondary | ICD-10-CM | POA: Diagnosis present

## 2019-01-30 DIAGNOSIS — Z8349 Family history of other endocrine, nutritional and metabolic diseases: Secondary | ICD-10-CM

## 2019-01-30 DIAGNOSIS — Z91040 Latex allergy status: Secondary | ICD-10-CM

## 2019-01-30 DIAGNOSIS — R079 Chest pain, unspecified: Secondary | ICD-10-CM | POA: Diagnosis not present

## 2019-01-30 DIAGNOSIS — R0600 Dyspnea, unspecified: Secondary | ICD-10-CM

## 2019-01-30 DIAGNOSIS — J9601 Acute respiratory failure with hypoxia: Secondary | ICD-10-CM | POA: Diagnosis present

## 2019-01-30 DIAGNOSIS — Z23 Encounter for immunization: Secondary | ICD-10-CM

## 2019-01-30 DIAGNOSIS — F419 Anxiety disorder, unspecified: Secondary | ICD-10-CM | POA: Diagnosis present

## 2019-01-30 DIAGNOSIS — Z888 Allergy status to other drugs, medicaments and biological substances status: Secondary | ICD-10-CM

## 2019-01-30 DIAGNOSIS — F445 Conversion disorder with seizures or convulsions: Secondary | ICD-10-CM | POA: Diagnosis present

## 2019-01-30 DIAGNOSIS — Z6830 Body mass index (BMI) 30.0-30.9, adult: Secondary | ICD-10-CM

## 2019-01-30 DIAGNOSIS — K580 Irritable bowel syndrome with diarrhea: Secondary | ICD-10-CM | POA: Diagnosis present

## 2019-01-30 DIAGNOSIS — Z9049 Acquired absence of other specified parts of digestive tract: Secondary | ICD-10-CM

## 2019-01-30 DIAGNOSIS — E873 Alkalosis: Secondary | ICD-10-CM | POA: Diagnosis present

## 2019-01-30 DIAGNOSIS — M47812 Spondylosis without myelopathy or radiculopathy, cervical region: Secondary | ICD-10-CM | POA: Diagnosis present

## 2019-01-30 DIAGNOSIS — Z79899 Other long term (current) drug therapy: Secondary | ICD-10-CM

## 2019-01-30 DIAGNOSIS — F1010 Alcohol abuse, uncomplicated: Secondary | ICD-10-CM | POA: Diagnosis present

## 2019-01-30 DIAGNOSIS — E669 Obesity, unspecified: Secondary | ICD-10-CM | POA: Diagnosis present

## 2019-01-30 DIAGNOSIS — Z91041 Radiographic dye allergy status: Secondary | ICD-10-CM

## 2019-01-30 DIAGNOSIS — J9811 Atelectasis: Secondary | ICD-10-CM | POA: Diagnosis not present

## 2019-01-30 DIAGNOSIS — Z882 Allergy status to sulfonamides status: Secondary | ICD-10-CM

## 2019-01-30 LAB — COMPREHENSIVE METABOLIC PANEL
ALT: 33 U/L (ref 0–44)
AST: 40 U/L (ref 15–41)
Albumin: 4.7 g/dL (ref 3.5–5.0)
Alkaline Phosphatase: 83 U/L (ref 38–126)
Anion gap: 19 — ABNORMAL HIGH (ref 5–15)
BUN: 12 mg/dL (ref 6–20)
CO2: 22 mmol/L (ref 22–32)
Calcium: 10.3 mg/dL (ref 8.9–10.3)
Chloride: 96 mmol/L — ABNORMAL LOW (ref 98–111)
Creatinine, Ser: 1 mg/dL (ref 0.44–1.00)
GFR calc Af Amer: >60 mL/min (ref >60)
GFR calc non Af Amer: >60 mL/min (ref >60)
Glucose, Bld: 93 mg/dL (ref 70–99)
Potassium: 4 mmol/L (ref 3.5–5.1)
Sodium: 137 mmol/L (ref 135–145)
Total Bilirubin: 1.7 mg/dL — ABNORMAL HIGH (ref 0.3–1.2)
Total Protein: 8.2 g/dL — ABNORMAL HIGH (ref 6.5–8.1)

## 2019-01-30 LAB — CBC WITH DIFFERENTIAL/PLATELET
Abs Immature Granulocytes: 0.06 10*3/uL (ref 0.00–0.07)
Basophils Absolute: 0.1 10*3/uL (ref 0.0–0.1)
Basophils Relative: 1 %
Eosinophils Absolute: 0.1 10*3/uL (ref 0.0–0.5)
Eosinophils Relative: 1 %
HCT: 46.2 % — ABNORMAL HIGH (ref 36.0–46.0)
Hemoglobin: 15.9 g/dL — ABNORMAL HIGH (ref 12.0–15.0)
Immature Granulocytes: 1 %
Lymphocytes Relative: 34 %
Lymphs Abs: 4.3 10*3/uL — ABNORMAL HIGH (ref 0.7–4.0)
MCH: 32.8 pg (ref 26.0–34.0)
MCHC: 34.4 g/dL (ref 30.0–36.0)
MCV: 95.3 fL (ref 80.0–100.0)
Monocytes Absolute: 0.6 10*3/uL (ref 0.1–1.0)
Monocytes Relative: 5 %
Neutro Abs: 7.4 10*3/uL (ref 1.7–7.7)
Neutrophils Relative %: 58 %
Platelets: 377 10*3/uL (ref 150–400)
RBC: 4.85 MIL/uL (ref 3.87–5.11)
RDW: 13.6 % (ref 11.5–15.5)
WBC: 12.4 10*3/uL — ABNORMAL HIGH (ref 4.0–10.5)
nRBC: 0 % (ref 0.0–0.2)

## 2019-01-30 LAB — BLOOD GAS, VENOUS
Acid-Base Excess: 4.7 mmol/L — ABNORMAL HIGH (ref 0.0–2.0)
Bicarbonate: 29.8 mmol/L — ABNORMAL HIGH (ref 20.0–28.0)
O2 Content: 2 L/min
O2 Saturation: 92.1 %
Patient temperature: 98.8
pCO2, Ven: 47.9 mmHg (ref 44.0–60.0)
pH, Ven: 7.411 (ref 7.250–7.430)
pO2, Ven: 64.9 mmHg — ABNORMAL HIGH (ref 32.0–45.0)

## 2019-01-30 LAB — D-DIMER, QUANTITATIVE: D-Dimer, Quant: <0.27 ug/mL-FEU (ref 0.00–0.50)

## 2019-01-30 LAB — TROPONIN I: Troponin I: <0.03 ng/mL (ref <0.03)

## 2019-01-30 LAB — LACTIC ACID, PLASMA: Lactic Acid, Venous: 0.9 mmol/L (ref 0.5–1.9)

## 2019-01-30 LAB — BRAIN NATRIURETIC PEPTIDE: B Natriuretic Peptide: 13.1 pg/mL (ref 0.0–100.0)

## 2019-01-30 LAB — SARS CORONAVIRUS 2 BY RT PCR (HOSPITAL ORDER, PERFORMED IN ~~LOC~~ HOSPITAL LAB): SARS Coronavirus 2: NEGATIVE

## 2019-01-30 MED ORDER — DIPHENHYDRAMINE HCL 50 MG/ML IJ SOLN
25.0000 mg | Freq: Once | INTRAMUSCULAR | Status: AC
  Administered 2019-01-30: 25 mg via INTRAVENOUS
  Filled 2019-01-30: qty 1

## 2019-01-30 MED ORDER — METOCLOPRAMIDE HCL 5 MG/ML IJ SOLN
10.0000 mg | Freq: Once | INTRAMUSCULAR | Status: AC
  Administered 2019-01-30: 10 mg via INTRAVENOUS
  Filled 2019-01-30: qty 2

## 2019-01-30 MED ORDER — SODIUM CHLORIDE 0.9 % IV BOLUS
1000.0000 mL | Freq: Once | INTRAVENOUS | Status: AC
  Administered 2019-01-30: 1000 mL via INTRAVENOUS

## 2019-01-30 MED ORDER — MAGNESIUM SULFATE IN D5W 1-5 GM/100ML-% IV SOLN
1.0000 g | Freq: Once | INTRAVENOUS | Status: AC
  Administered 2019-01-30: 1 g via INTRAVENOUS
  Filled 2019-01-30: qty 100

## 2019-01-30 MED ORDER — ALBUTEROL SULFATE HFA 108 (90 BASE) MCG/ACT IN AERS
4.0000 | INHALATION_SPRAY | Freq: Once | RESPIRATORY_TRACT | Status: AC
  Administered 2019-01-30: 4 via RESPIRATORY_TRACT
  Filled 2019-01-30: qty 6.7

## 2019-01-30 NOTE — ED Notes (Signed)
Per RN pt was stating she has a headache and feeling a little whoozy. Orthostatics and ambulating with Pulse Ox will be done once meds are finished.

## 2019-01-30 NOTE — ED Provider Notes (Signed)
[place] DEPT Provider Note   CSN: [id number] Arrival date & time: 01/30/19  1820    History   Chief Complaint Chief Complaint  Patient presents with  . Shortness of Breath  . Chest Pain  . Headache  . Cough    HPI [name] is a 53 y.o. female h/o IBS with diarrhea, migraines, severe depression, suicide attempts, HTN, HLD, tobacco use is here for evaluation of dry persistent cough, nausea, nbnb emesis, non bloody diarrhea, body aches, headaches, decreased appetite, chills, cold sweats.  Onset 8 days ago.  Over the last 6 days she has had light-headedness, chest tightness and shortness of breath with exertion.  Typically she walks 3 miles a day but now having dyspnea with going up 3 flights of stairs. She had a telemedicine visit with Dr Vista Lawman her PCP today and was directed to ED for evaluation and possible COVID testing.  Has used albuterol q 6 hours, zofran and lomotil without relief. Has been tolerating some food/liquids today with nausea.  Emesis x 2, diarrhea x 2 today.  Requesting something for migraine. States she can't take tylenol due to "a lot of tylenol overdoses".   Unknown fevers.  No associated sore throat, rhinorrhea, abdominal pain.  No sick contacts. No recent travel, exposure to confirmed/suspected COVID.  H/o appendectomy, cholecystectomy, hysterectomy.  No h/o DVT, PE, calf pain or swelling, prolonged immobilization/travel, hormonal therapy, birth control pills, hemoptysis, malignancy.     HPI  Past Medical History:  Diagnosis Date  . Anorexia   . Anxiety   . Depression   . DJD (degenerative joint disease) of cervical spine   . Endometriosis   . ETOH abuse    sober 3 1/2 years  . Migraines   . PICC (peripherally inserted central catheter) in place    rt neck  . Psychiatric pseudoseizure   . Seizures [facility])     Patient Active Problem List   Diagnosis Date Noted  . SOB (shortness of breath) 01/30/2019  . Abdominal  pain   . Suicide attempt ([place])   . Amitriptyline overdose   . Respiratory acidosis   . Prolonged Q-T interval on ECG   . Alcohol abuse   . Depression   . Anxiety state   . Bipolar I disorder, most recent episode (or current) unspecified   . Respiratory failure ([place])   . Seizure-like activity ([place]) 12/11/2014  . Suicide attempt by hanging ([place])   . MDD (major depressive disorder), recurrent severe, without psychosis ([place]) 11/29/2014  . Panic disorder 11/29/2014  . Agoraphobia 11/29/2014  . ARF (acute renal failure) ([place]) 11/25/2014  . Intentional acetaminophen overdose ([place]) 10/26/2014  . Major depressive disorder, recurrent, severe without psychotic features ([place])   . Insomnia   . Chronic pain syndrome   . Bipolar 1 disorder, mixed, severe ([place]) 10/09/2014  . Acute blood loss anemia 10/07/2014  . Liver failure, acute   . GI bleed 10/06/2014  . Duodenal ulcer hemorrhage 10/06/2014  . Vomiting 10/05/2014  . Acute hepatitis 10/05/2014  . Acute renal insufficiency 10/05/2014  . Seizure disorder ([place]) 10/05/2014  . Hyperammonemia ([place]) 10/05/2014  . Suicidal ideation 10/05/2014  . Bipolar disorder ([place]) 10/01/2014  . Major depression 10/01/2014  . Nausea and vomiting 09/30/2014  . Diarrhea 09/30/2014  . SIRS (systemic inflammatory response syndrome) ([place]) 07/15/2014  . Tylenol overdose 07/13/2014  . Hypokalemia 07/13/2014    Past Surgical History:  Procedure Laterality Date  . ABDOMINAL HYSTERECTOMY    .  ABDOMINAL SURGERY    . APPENDECTOMY    . CARPAL TUNNEL RELEASE     2010  . CHOLECYSTECTOMY    . ESOPHAGOGASTRODUODENOSCOPY N/A 10/06/2014   Procedure: ESOPHAGOGASTRODUODENOSCOPY (EGD);  Surgeon: Inda Castle, MD;  Location: [place];  Service: Endoscopy;  Laterality: N/A;  . KNEE SURGERY    . laproscopy    . NASAL SINUS SURGERY       OB History   No obstetric history on file.      Home Medications    Prior to Admission medications   Medication Sig  Start Date End Date Taking? Authorizing Provider  amitriptyline (ELAVIL) 25 MG tablet Take 25 mg by mouth 2 (two) times daily.   Yes [provider]  amLODipine (NORVASC) 10 MG tablet Take 10 mg by mouth daily.   Yes [provider]  clonazePAM (KLONOPIN) 0.5 MG tablet Take 0.5 mg by mouth 3 (three) times daily.   Yes [provider]  diphenoxylate-atropine (LOMOTIL) 2.5-0.025 MG tablet Take 1 tablet by mouth 4 (four) times daily as needed for diarrhea or loose stools.   Yes [provider]  fluticasone (FLONASE) 50 MCG/ACT nasal spray Place 1 spray into both nostrils daily as needed for allergies or rhinitis.   Yes [provider]  hydrochlorothiazide (MICROZIDE) 12.5 MG capsule Take 12.5 mg by mouth daily.   Yes [provider]  methocarbamol (ROBAXIN) 500 MG tablet Take 1,000 mg by mouth 3 (three) times daily as needed for muscle spasms.   Yes [provider]  mirtazapine (REMERON) 15 MG tablet Take 15 mg by mouth at bedtime.   Yes [provider]  Multiple Vitamin (MULTIVITAMIN WITH MINERALS) TABS tablet Take 1 tablet by mouth daily.   Yes [provider]  QUEtiapine (SEROQUEL) 100 MG tablet Take 100 mg by mouth at bedtime.   Yes [provider]  rosuvastatin (CRESTOR) 20 MG tablet Take 20 mg by mouth at bedtime.   Yes [provider]  acetaminophen (TYLENOL) 500 MG tablet Take 500-1,000 mg by mouth every 6 (six) hours as needed for moderate pain.    [provider]  FLUoxetine (PROZAC) 20 MG capsule Take 1 capsule (20 mg total) by mouth daily. Patient not taking: Reported on 05/24/2018 02/06/15   Geradine Girt, DO  folic acid (FOLVITE) 1 MG tablet Take 1 tablet (1 mg total) by mouth daily. Patient not taking: Reported on 05/24/2018 01/25/15   Cristal Ford, DO  gabapentin (NEURONTIN) 300 MG capsule Take 1 capsule (300 mg total) by mouth 3 (three) times daily. Patient not taking: Reported  on 05/24/2018 01/25/15   Cristal Ford, DO  HYDROcodone-acetaminophen (NORCO/VICODIN) 5-325 MG tablet Take 1-2 tablets by mouth every 4 (four) hours as needed. 05/24/18   Davonna Belling, MD  ondansetron (ZOFRAN-ODT) 4 MG disintegrating tablet Take 1 tablet (4 mg total) by mouth every 8 (eight) hours as needed for nausea or vomiting. Patient not taking: Reported on 05/24/2018 02/06/15   Geradine Girt, DO  pantoprazole (PROTONIX) 40 MG tablet Take 1 tablet (40 mg total) by mouth 2 (two) times daily. Patient not taking: Reported on 05/24/2018 10/19/14   Knox Royalty, NP  permethrin (ELIMITE) 5 % cream Apply to entire body. Wash off after 8-14 hours. Repeat treatment in 1 week. Patient not taking: Reported on 05/24/2018 03/27/17   Joy, Raquel Sarna C, PA-C  sucralfate (CARAFATE) 1 GM/10ML suspension Take 10 mLs (1 g total) by mouth 4 (four) times daily -  with meals and at bedtime. Patient not taking: Reported on 05/24/2018 02/06/15   Geradine Girt, DO  thiamine 100 MG tablet Take 1 tablet (100 mg total) by mouth daily. Patient not taking: Reported on 05/24/2018 01/25/15   Cristal Ford, DO    Family History Family History  Problem Relation Age of Onset  . Hypertension Mother   . Hyperlipidemia Mother   . Heart failure Father   . Cancer Other     Social History Social History   Tobacco Use  . Smoking status: Current Every Day Smoker    Packs/day: 0.15    Years: 20.00    Pack years: 3.00    Types: Cigarettes  . Smokeless tobacco: Never Used  Substance Use Topics  . Alcohol use: Not Currently  . Drug use: Not Currently     Allergies   Aspirin; Doxycycline; Imitrex [sumatriptan base]; Iodinated diagnostic agents; Lidocaine; Prednisone; Sulfa antibiotics; Sulfasalazine; Sumatriptan; Tramadol; Levofloxacin; Latex; Levofloxacin; Metrizamide; Nsaids; and Tolmetin   Review of Systems Review of Systems  Constitutional: Positive for chills and fatigue.  Respiratory: Positive for cough, chest  tightness and shortness of breath.   Gastrointestinal: Positive for diarrhea, nausea and vomiting.  Musculoskeletal: Positive for myalgias.  Neurological: Positive for headaches.  All other systems reviewed and are negative.    Physical Exam Updated Vital Signs BP 134/75   Pulse 93   Temp 98.8 F (37.1 C) (Oral)   Resp 18   Ht 5\' 2"  (1.575 m)   Wt 74.8 kg   SpO2 96%   BMI 30.18 kg/m   Physical Exam Vitals signs and nursing note reviewed.  Constitutional:      Appearance: She is well-developed.     Comments: Non toxic. Wearing sun glasses.   HENT:     Head: Normocephalic and atraumatic.     Nose: Nose normal.     Comments: No nasal mucosal edema, rhinorrhea    Mouth/Throat:     Comments: MMM. Oropharynx and tonsils normal.  Eyes:     Conjunctiva/sclera: Conjunctivae normal.  Neck:     Musculoskeletal: Normal range of motion.  Cardiovascular:     Rate and Rhythm: Regular rhythm. Tachycardia present.     Comments: HR 90-110s. 1+ radial and DP pulses bilaterally. No LE edema. No calf tenderness.  Pulmonary:     Effort: Pulmonary effort is normal.     Breath sounds: Normal breath sounds.     Comments: Normal WOB. Speaking in full sentences.   Abdominal:     General: Bowel sounds are normal.     Palpations: Abdomen is soft.     Tenderness: There is no abdominal tenderness.     Comments: No G/R/R. No suprapubic or CVA tenderness. Negative Murphy's and McBurney's. Active BS to lower quadrants.   Musculoskeletal: Normal range of motion.  Skin:    General: Skin is warm and dry.     Capillary Refill: Capillary refill takes less than 2 seconds.  Neurological:     Mental Status: She is alert.  Psychiatric:        Behavior: Behavior normal.      ED Treatments / Results  Labs (all labs ordered are listed, but only abnormal results are displayed) Labs Reviewed  CBC WITH DIFFERENTIAL/PLATELET - Abnormal; Notable for the following components:      Result Value   WBC  12.4 (*)    Hemoglobin 15.9 (*)    HCT 46.2 (*)    Lymphs Abs 4.3 (*)  All other components within normal limits  COMPREHENSIVE METABOLIC PANEL - Abnormal; Notable for the following components:   Chloride 96 (*)    Total Protein 8.2 (*)    Total Bilirubin 1.7 (*)    Anion gap 19 (*)    All other components within normal limits  BLOOD GAS, VENOUS - Abnormal; Notable for the following components:   pO2, Ven 64.9 (*)    Bicarbonate 29.8 (*)    Acid-Base Excess 4.7 (*)    All other components within normal limits  SARS CORONAVIRUS 2 (HOSPITAL ORDER, [place] [facility])  TROPONIN I  D-DIMER, QUANTITATIVE (NOT AT [facility])  LACTIC ACID, PLASMA  LACTIC ACID, PLASMA    EKG None  Radiology Dg Chest Portable 1 View  Result Date: 01/30/2019 CLINICAL DATA:  Initial evaluation for acute shortness of breath, cough. EXAM: PORTABLE CHEST 1 VIEW COMPARISON:  Prior radiograph from 05/24/2018 FINDINGS: Right-sided Port-A-Cath in place. Stable cardiomegaly. Mediastinal silhouette normal. Lungs normally inflated. No focal infiltrates. No edema or effusion. Minimal left basilar subsegmental atelectasis. No pneumothorax. No acute osseous finding. IMPRESSION: 1. Minimal left basilar subsegmental atelectasis. No other active cardiopulmonary disease. 2. Stable cardiomegaly without pulmonary edema. Electronically Signed   By: Jeannine Boga M.D.   On: 01/30/2019 19:51    Procedures Procedures (including critical care time)  Medications Ordered in ED Medications  sodium chloride 0.9 % bolus 1,000 mL (0 mLs Intravenous Stopped 01/30/19 2159)  metoCLOPramide (REGLAN) injection 10 mg (10 mg Intravenous Given 01/30/19 2044)  magnesium sulfate IVPB 1 g 100 mL (0 g Intravenous Stopped 01/30/19 2159)  diphenhydrAMINE (BENADRYL) injection 25 mg (25 mg Intravenous Given 01/30/19 2044)  albuterol (VENTOLIN HFA) 108 (90 Base) MCG/ACT inhaler 4 puff (4 puffs Inhalation Given 01/30/19 2222)      Initial Impression / Assessment and Plan / ED Course  I have reviewed the triage vital signs and the nursing notes.  Pertinent labs & imaging results that were available during my care of the patient were reviewed by me and considered in my medical decision making (see chart for details).  Clinical Course as of Jan 30 2303  Tue Jan 30, 2019  1859 [facility] PCP    [CG]  2101 WBC(!): 12.4 [CG]  2127 Anion gap(!): 19 [CG]  2146 SpO2 87% on RA with good pleth.  I ambulated patient around her bed and SpO2 dropped to 84% with good pleth, then hovering around 92-95%.  Placed on 2L [place].  COVID swab, lactic acid, VBG ordered.    [CG]    Clinical Course User Index [CG] Kinnie Feil, PA-C      ddx includes viral process including COVID-19 vs CAP.  She has exertional dyspnea/chest tightness but given other infectious symptoms this is unlikely to be ACS.  COVID has been associated with microemboli process and HR 109 on arrival, she has no other risk factors for PE, but will add d-dimer.  Symptoms and exam not consistent with HF exacerbation.  She has no abdominal tenderness and chronic diarrhea from IBS, intraabdominal process less likely.  Will obtain labs, EKG, CXR, ambulate with pulse ox, reassess.  I see no indication for formal COVID-19 testing in the ED at this time.  She has a low risk profile for complications of it.   2213: WBC 12.4, AG 19. Glucose normal.  D-dimer, trop, CXR negative.  She had hypoxemia with exertion and at rest 84-87% now on 2 L [place].  Will add COVID-19 swab, vbg,  lactic acid.  hospitalist consult pending for hypoxemia highest suspicion for COVID-19.   Final Clinical Impressions(s) / ED Diagnoses   Final diagnoses:  Hypoxemia    ED Discharge Orders    None       Arlean Hopping 01/30/19 2304    Duffy Bruce, MD 02/01/19 [phone]

## 2019-01-30 NOTE — ED Triage Notes (Signed)
Patient c/o SOB, dry cough, chest tightness, and a headache x 8 days. Patient states she did a televideo visit with her physician. Patient states her symptoms are worsening. Patient was sent to be tested for covid-19.

## 2019-01-30 NOTE — ED Notes (Signed)
Patient walked around in the room and patient o2 sat is  84%. Patient was put on 2L [place]. Patient o2 sat-98%

## 2019-01-30 NOTE — ED Notes (Signed)
Attempted IV x 2 with no success. 

## 2019-01-30 NOTE — H&P (Signed)
History and Physical  [name] ONG:[id number] DOB: 11/04/1965 DOA: 01/30/2019  Referring physician: ER provider PCP: [name], MD  Outpatient Specialists:    Patient coming from: Home  Chief Complaint: Shortness of breath  HPI: Patient is a 53 year old female, obese, with past medical history significant for migraines, alcohol abuse, endometriosis, depression, anxiety and pseudoseizures.  Apparently, patient was consulted by her primary care provider via telehealth with main concern for shortness of breath.  The patient's primary care provider advised patient to come to the hospital for further evaluation for possible COVID 19.  Patient endorsed shortness of breath, chest tightness, headache, light avoidance, no fever or chills, no coughing or sputum production.  According to the patient, she used to walk 3 miles but now gets short of breath and tired after climbing 3 flights of stairs.  No orthopnea or paroxysmal nocturnal dyspnea.  Chest x-ray is negative for acute pulmonary edema.  No neck pain, no chest pain, vague GI symptoms as per prior documentations, no fever or chills.  Repeat COVID-19 test has come back negative.  Patient is asking for pain medication.  Patient be admitted for further assessment and management.  ED Course: Vitals on presentation revealed temperature of 98.8, blood pressure of 122-143/61-107 mmHg, heart rate of 90 to 109 bpm and respiratory rate of 15.  Patient is currently stable with supplemental oxygen.  O2 sat on presentation ranged from 85 to 98%.  D-dimer is less than 0.27.  Mild leukocytosis noted.  Hospitalist team has been asked to admit patient for further assessment and management.  Pertinent labs: CBC reveals WBC of 12.4, hemoglobin of 15.9, hematocrit of 46.2, MCV of 95.3 with platelet count of 377.  BMP revealed sodium of 137, potassium of 4, chloride of 96, CO2 of 22, BUN of 12, creatinine of 1 with blood sugar of 93.  Cardiac BNP is 13.1.   Troponin is less than 0.03.  EKG: Independently reviewed.  Low voltage precordial leads and QTc interval of 490 ms.  Imaging: independently reviewed.   Review of Systems:  Negative for fever, visual changes, sore throat, rash, new muscle aches, chest pain, dysuria, bleeding.  Past Medical History:  Diagnosis Date  . Anorexia   . Anxiety   . Depression   . DJD (degenerative joint disease) of cervical spine   . Endometriosis   . ETOH abuse    sober 3 1/2 years  . Migraines   . PICC (peripherally inserted central catheter) in place    rt neck  . Psychiatric pseudoseizure   . Seizures ([place])     Past Surgical History:  Procedure Laterality Date  . ABDOMINAL HYSTERECTOMY    . ABDOMINAL SURGERY    . APPENDECTOMY    . CARPAL TUNNEL RELEASE     2010  . CHOLECYSTECTOMY    . ESOPHAGOGASTRODUODENOSCOPY N/A 10/06/2014   Procedure: ESOPHAGOGASTRODUODENOSCOPY (EGD);  Surgeon: Inda Castle, MD;  Location: [facility];  Service: Endoscopy;  Laterality: N/A;  . KNEE SURGERY    . laproscopy    . NASAL SINUS SURGERY       reports that she has been smoking cigarettes. She has a 3.00 pack-year smoking history. She has never used smokeless tobacco. She reports previous alcohol use. She reports previous drug use.  Allergies  Allergen Reactions  . Aspirin Anaphylaxis  . Doxycycline Anaphylaxis and Other (See Comments)    Joint damage also  . Imitrex [Sumatriptan Base] Other (See Comments)    Severe hypertension  . Iodinated  Diagnostic Agents Anaphylaxis  . Lidocaine Anaphylaxis    Pt states she does well with Marcaine/Bupivicaine without problem  . Prednisone Other (See Comments)    She goes crazy  . Sulfa Antibiotics Anaphylaxis  . Sulfasalazine Anaphylaxis  . Sumatriptan Other (See Comments)    Felt like she was "having a stroke", heart rate and blood pressure "went through the roof"  . Tramadol Other (See Comments)    seizures  . Levofloxacin Other (See Comments)    Joints  hurt  . Latex Rash  . Levofloxacin Rash and Other (See Comments)    Joints hurt  . Metrizamide Other (See Comments)  . Nsaids Rash and Other (See Comments)    GI bleed  . Tolmetin Rash    Family History  Problem Relation Age of Onset  . Hypertension Mother   . Hyperlipidemia Mother   . Heart failure Father   . Cancer Other      Prior to Admission medications   Medication Sig Start Date End Date Taking? Authorizing Provider  acetaminophen (TYLENOL) 500 MG tablet Take 500-1,000 mg by mouth every 6 (six) hours as needed for moderate pain.    [provider]  alum & mag hydroxide-simeth (MAALOX/MYLANTA) 200-200-20 MG/5ML suspension Take 30 mLs by mouth every 6 (six) hours as needed for indigestion or heartburn. Patient not taking: Reported on 05/24/2018 02/06/15   Geradine Girt, DO  amitriptyline (ELAVIL) 25 MG tablet Take 25 mg by mouth 2 (two) times daily.    [provider]  amLODipine (NORVASC) 10 MG tablet Take 10 mg by mouth daily.    [provider]  butalbital-acetaminophen-caffeine (FIORICET, ESGIC) 50-325-40 MG tablet Take 1 tablet by mouth every 6 (six) hours as needed for headache. Patient not taking: Reported on 05/24/2018 11/20/15   Shary Decamp, PA-C  carisoprodol (SOMA) 350 MG tablet Take 350 mg by mouth 3 (three) times daily as needed for muscle spasms.    [provider]  clonazePAM (KLONOPIN) 0.5 MG tablet Take 0.5 mg by mouth 3 (three) times daily.    [provider]  clotrimazole (LOTRIMIN) 1 % cream Apply to affected area 2 times daily. Use for 4 weeks. Patient not taking: Reported on 05/24/2018 03/27/17   Joy, Raquel Sarna C, PA-C  dicyclomine (BENTYL) 20 MG tablet Take 20 mg by mouth 4 (four) times daily as needed for spasms.    [provider]  feeding supplement, RESOURCE BREEZE, (RESOURCE BREEZE) LIQD Take 1 Container by mouth 3 (three) times daily between meals. Patient not taking: Reported on 05/24/2018 01/25/15   Cristal Ford, DO  FLUoxetine (PROZAC) 20 MG capsule Take 1 capsule (20 mg total) by mouth daily. Patient not taking: Reported on 05/24/2018 02/06/15   Geradine Girt, DO  fluticasone [facility]) 50 MCG/ACT nasal spray Place 1 spray into both nostrils daily as needed for allergies or rhinitis.    [provider]  folic acid (FOLVITE) 1 MG tablet Take 1 tablet (1 mg total) by mouth daily. Patient not taking: Reported on 05/24/2018 01/25/15   Cristal Ford, DO  gabapentin (NEURONTIN) 300 MG capsule Take 1 capsule (300 mg total) by mouth 3 (three) times daily. Patient not taking: Reported on 05/24/2018 01/25/15   Cristal Ford, DO  hydrochlorothiazide (MICROZIDE) 12.5 MG capsule Take 12.5 mg by mouth daily.    [provider]  HYDROcodone-acetaminophen (NORCO/VICODIN) 5-325 MG tablet Take 1-2 tablets by mouth every 4 (four) hours as needed. 05/24/18   Davonna Belling, MD  mirtazapine (REMERON) 15 MG tablet Take 15 mg by mouth at bedtime.    [provider]  ondansetron (ZOFRAN-ODT) 4 MG disintegrating tablet Take 1 tablet (4 mg total) by mouth every 8 (eight) hours as needed for nausea or vomiting. Patient not taking: Reported on 05/24/2018 02/06/15   Geradine Girt, DO  pantoprazole (PROTONIX) 40 MG tablet Take 1 tablet (40 mg total) by mouth 2 (two) times daily. Patient not taking: Reported on 05/24/2018 10/19/14   Knox Royalty, NP  permethrin (ELIMITE) 5 % cream Apply to entire body. Wash off after 8-14 hours. Repeat treatment in 1 week. Patient not taking: Reported on 05/24/2018 03/27/17   Lorayne Bender, PA-C  Probiotic Product (PROBIOTIC-10) CAPS Take 1 capsule by mouth daily.    [provider]  rosuvastatin (CRESTOR) 20 MG tablet Take 20 mg by mouth at bedtime.    [provider]  sucralfate (CARAFATE) 1 GM/10ML suspension Take 10 mLs (1 g total) by mouth 4 (four) times daily -  with meals and at bedtime. Patient not taking: Reported on 05/24/2018 02/06/15   Geradine Girt, DO  thiamine 100 MG tablet Take 1 tablet (100 mg total) by mouth daily. Patient not taking: Reported on 05/24/2018 01/25/15   Cristal Ford, DO    Physical Exam: Vitals:   01/30/19 2100 01/30/19 2130 01/30/19 2200 01/30/19 2222  BP: 132/72 132/85 134/75   Pulse: 94 92 93   Resp:   18   Temp:      TempSrc:      SpO2: (!) 86% (!) 85% 97% 96%  Weight:      Height:        Constitutional:  . Appears calm and comfortable.  Obese Eyes:  . No pallor. No jaundice.  ENMT:  . external ears, nose appear normal Neck:  . Neck is supple. No JVD Respiratory:  . CTA bilaterally, no w/r/r.  . Respiratory effort normal. No retractions or accessory muscle use Cardiovascular:  . S1S2 . No LE extremity edema   Abdomen:  . Abdomen is obese, soft and non tender. Organs are difficult to assess. Neurologic:  . Awake and alert. . Moves all limbs.  Wt Readings from Last 3 Encounters:  01/30/19 74.8 kg  05/24/18 81.6 kg  03/27/17 70.3 kg    I have personally reviewed following labs and imaging studies  Labs on Admission:  CBC: Recent Labs  Lab 01/30/19 2005  WBC 12.4*  NEUTROABS 7.4  HGB 15.9*  HCT 46.2*  MCV 95.3  PLT 528   Basic Metabolic Panel: Recent Labs  Lab 01/30/19 2005  NA 137  K 4.0  CL 96*  CO2 22  GLUCOSE 93  BUN 12  CREATININE 1.00  CALCIUM 10.3   Liver Function Tests: Recent Labs  Lab 01/30/19 2005  AST 40  ALT 33  ALKPHOS 83  BILITOT 1.7*  PROT 8.2*  ALBUMIN 4.7   No results for input(s): LIPASE, AMYLASE in the last 168 hours. No results for input(s): AMMONIA in the last 168 hours. Coagulation Profile: No results for input(s): INR, PROTIME in the last 168 hours. Cardiac Enzymes: Recent Labs  Lab 01/30/19 2005  TROPONINI <0.03   BNP (last 3 results) No results for input(s): PROBNP in the last 8760 hours. HbA1C: No results for input(s): HGBA1C in the last 72 hours. CBG: No results for input(s): GLUCAP in the last 168 hours.  Lipid Profile: No results for input(s): CHOL, HDL, LDLCALC, TRIG, CHOLHDL, LDLDIRECT  in the last 72 hours. Thyroid Function Tests: No results for input(s): TSH, T4TOTAL, FREET4, T3FREE, THYROIDAB in the last 72 hours. Anemia Panel: No results for input(s): VITAMINB12, FOLATE, FERRITIN, TIBC, IRON, RETICCTPCT in the last 72 hours. Urine analysis:    Component Value Date/Time   COLORURINE YELLOW 01/20/2015 0200   APPEARANCEUR CLEAR 01/20/2015 0200   LABSPEC 1.015 01/20/2015 0200   PHURINE 5.0 01/20/2015 0200   GLUCOSEU NEGATIVE 01/20/2015 0200   HGBUR NEGATIVE 01/20/2015 0200   BILIRUBINUR NEGATIVE 01/20/2015 0200   BILIRUBINUR neg 05/18/2012 1336   KETONESUR NEGATIVE 01/20/2015 0200   PROTEINUR NEGATIVE 01/20/2015 0200   UROBILINOGEN 0.2 01/20/2015 0200   NITRITE NEGATIVE 01/20/2015 0200   LEUKOCYTESUR NEGATIVE 01/20/2015 0200   Sepsis Labs: @LABRCNTIP (procalcitonin:4,lacticidven:4) )No results found for this or any previous visit (from the past 240 hour(s)).    Radiological Exams on Admission: Dg Chest Portable 1 View  Result Date: 01/30/2019 CLINICAL DATA:  Initial evaluation for acute shortness of breath, cough. EXAM: PORTABLE CHEST 1 VIEW COMPARISON:  Prior radiograph from 05/24/2018 FINDINGS: Right-sided Port-A-Cath in place. Stable cardiomegaly. Mediastinal silhouette normal. Lungs normally inflated. No focal infiltrates. No edema or effusion. Minimal left basilar subsegmental atelectasis. No pneumothorax. No acute osseous finding. IMPRESSION: 1. Minimal left basilar subsegmental atelectasis. No other active cardiopulmonary disease. 2. Stable cardiomegaly without pulmonary edema. Electronically Signed   By: Jeannine Boga M.D.   On: 01/30/2019 19:51    EKG: Independently reviewed.   Active Problems:   SOB (shortness of breath)   Assessment/Plan Shortness of breath:  Etiology is unclear. Cardiac BNP is within normal range. Rapid COVID 19 test is negative  D-dimer is within normal range We will proceed with ABG Nebs DuoNeb and Pulmicort Further management will depend on hospital course.  Leukocytosis: History of shortness of breath noted No fever or chills Chest x-ray is negative for infiltrate Will check procalcitonin We will monitor WBC We will hold antibiotics for now. Continue to assess closely.   Migraine headache: IV Solu-Medrol IV Depakote There is reported allergy to triptan's Continue PRN Tylenol Further management depend on hospital course   History of depression/anxiety: Optimize medication QTc interval of 490 ms noted Manage expectantly.  DVT prophylaxis: Subacute Lovenox Code Status: Full code Family Communication:  Disposition Plan: Home eventually Consults called: None Admission status: Observation  Time spent: 65 minutes.   Dana Allan, MD  [facility] Pager #: [phone] 7PM-7AM contact night coverage as above  01/30/2019, 11:00 PM

## 2019-01-31 ENCOUNTER — Encounter (HOSPITAL_COMMUNITY): Payer: Self-pay

## 2019-01-31 ENCOUNTER — Observation Stay (HOSPITAL_BASED_OUTPATIENT_CLINIC_OR_DEPARTMENT_OTHER): Payer: Medicare Other

## 2019-01-31 DIAGNOSIS — R0902 Hypoxemia: Secondary | ICD-10-CM | POA: Diagnosis not present

## 2019-01-31 DIAGNOSIS — I517 Cardiomegaly: Secondary | ICD-10-CM | POA: Diagnosis not present

## 2019-01-31 DIAGNOSIS — R0602 Shortness of breath: Secondary | ICD-10-CM | POA: Diagnosis not present

## 2019-01-31 LAB — ECHOCARDIOGRAM COMPLETE
Height: 62.25 in
Weight: 2843.2 oz

## 2019-01-31 LAB — CBC
HCT: 48 % — ABNORMAL HIGH (ref 36.0–46.0)
HCT: 40.5 % (ref 36.0–46.0)
Hemoglobin: 16.1 g/dL — ABNORMAL HIGH (ref 12.0–15.0)
Hemoglobin: 13.7 g/dL (ref 12.0–15.0)
MCH: 31.6 pg (ref 26.0–34.0)
MCH: 32.9 pg (ref 26.0–34.0)
MCHC: 33.5 g/dL (ref 30.0–36.0)
MCHC: 33.8 g/dL (ref 30.0–36.0)
MCV: 94.3 fL (ref 80.0–100.0)
MCV: 97.4 fL (ref 80.0–100.0)
Platelets: 288 10*3/uL (ref 150–400)
Platelets: 294 10*3/uL (ref 150–400)
RBC: 5.09 MIL/uL (ref 3.87–5.11)
RBC: 4.16 MIL/uL (ref 3.87–5.11)
RDW: 13.5 % (ref 11.5–15.5)
RDW: 13.7 % (ref 11.5–15.5)
WBC: 10.6 10*3/uL — ABNORMAL HIGH (ref 4.0–10.5)
WBC: 8.3 10*3/uL (ref 4.0–10.5)
nRBC: 0 % (ref 0.0–0.2)
nRBC: 0 % (ref 0.0–0.2)

## 2019-01-31 LAB — BLOOD GAS, ARTERIAL
Acid-Base Excess: 3.3 mmol/L — ABNORMAL HIGH (ref 0.0–2.0)
Bicarbonate: 25.3 mmol/L (ref 20.0–28.0)
Drawn by: 211790
O2 Content: 3 L/min
O2 Saturation: 98.2 %
Patient temperature: 98.6
pCO2 arterial: 31.9 mmHg — ABNORMAL LOW (ref 32.0–48.0)
pH, Arterial: 7.51 — ABNORMAL HIGH (ref 7.350–7.450)
pO2, Arterial: 109 mmHg — ABNORMAL HIGH (ref 83.0–108.0)

## 2019-01-31 LAB — BASIC METABOLIC PANEL
Anion gap: 13 (ref 5–15)
BUN: 9 mg/dL (ref 6–20)
CO2: 25 mmol/L (ref 22–32)
Calcium: 8.5 mg/dL — ABNORMAL LOW (ref 8.9–10.3)
Chloride: 98 mmol/L (ref 98–111)
Creatinine, Ser: 0.91 mg/dL (ref 0.44–1.00)
GFR calc Af Amer: >60 mL/min (ref >60)
GFR calc non Af Amer: >60 mL/min (ref >60)
Glucose, Bld: 132 mg/dL — ABNORMAL HIGH (ref 70–99)
Potassium: 2.8 mmol/L — ABNORMAL LOW (ref 3.5–5.1)
Sodium: 136 mmol/L (ref 135–145)

## 2019-01-31 LAB — MAGNESIUM
Magnesium: 2.5 mg/dL — ABNORMAL HIGH (ref 1.7–2.4)
Magnesium: 2.2 mg/dL (ref 1.7–2.4)

## 2019-01-31 LAB — CREATININE, SERUM
Creatinine, Ser: 0.94 mg/dL (ref 0.44–1.00)
GFR calc Af Amer: >60 mL/min (ref >60)
GFR calc non Af Amer: >60 mL/min (ref >60)

## 2019-01-31 LAB — HIV ANTIBODY (ROUTINE TESTING W REFLEX): HIV Screen 4th Generation wRfx: NONREACTIVE

## 2019-01-31 LAB — PHOSPHORUS: Phosphorus: 4.9 mg/dL — ABNORMAL HIGH (ref 2.5–4.6)

## 2019-01-31 LAB — TSH: TSH: 4.433 u[IU]/mL (ref 0.350–4.500)

## 2019-01-31 LAB — PROCALCITONIN: Procalcitonin: <0.1 ng/mL

## 2019-01-31 MED ORDER — ALBUTEROL SULFATE (2.5 MG/3ML) 0.083% IN NEBU: 2.5000 mg | INHALATION_SOLUTION | RESPIRATORY_TRACT | Status: DC | PRN

## 2019-01-31 MED ORDER — VALPROATE SODIUM 500 MG/5ML IV SOLN
1000.0000 mg | Freq: Once | INTRAVENOUS | Status: AC
  Administered 2019-01-31: 1000 mg via INTRAVENOUS
  Filled 2019-01-31: qty 10

## 2019-01-31 MED ORDER — SODIUM CHLORIDE 0.9% FLUSH
10.0000 mL | INTRAVENOUS | Status: DC | PRN
  Administered 2019-02-05: 10 mL
  Filled 2019-01-31: qty 40

## 2019-01-31 MED ORDER — PNEUMOCOCCAL VAC POLYVALENT 25 MCG/0.5ML IJ INJ
0.5000 mL | INJECTION | INTRAMUSCULAR | Status: AC
  Administered 2019-02-01: 0.5 mL via INTRAMUSCULAR
  Filled 2019-01-31: qty 0.5

## 2019-01-31 MED ORDER — BUDESONIDE 0.25 MG/2ML IN SUSP
0.2500 mg | Freq: Two times a day (BID) | RESPIRATORY_TRACT | Status: DC
  Administered 2019-01-31 – 2019-02-04 (×8): 0.25 mg via RESPIRATORY_TRACT
  Filled 2019-01-31 (×11): qty 2

## 2019-01-31 MED ORDER — METHYLPREDNISOLONE SODIUM SUCC 40 MG IJ SOLR
40.0000 mg | Freq: Four times a day (QID) | INTRAMUSCULAR | Status: DC
  Administered 2019-01-31 – 2019-02-01 (×3): 40 mg via INTRAVENOUS
  Filled 2019-01-31 (×4): qty 1

## 2019-01-31 MED ORDER — HYDROCODONE-ACETAMINOPHEN 5-325 MG PO TABS: 1.0000 | ORAL_TABLET | Freq: Four times a day (QID) | ORAL | Status: DC | PRN

## 2019-01-31 MED ORDER — CLONAZEPAM 0.5 MG PO TABS
0.5000 mg | ORAL_TABLET | Freq: Three times a day (TID) | ORAL | Status: DC
  Administered 2019-01-31 – 2019-02-05 (×17): 0.5 mg via ORAL
  Filled 2019-01-31 (×18): qty 1

## 2019-01-31 MED ORDER — ENOXAPARIN SODIUM 40 MG/0.4ML ~~LOC~~ SOLN
40.0000 mg | Freq: Every day | SUBCUTANEOUS | Status: DC
  Administered 2019-01-31 – 2019-02-04 (×5): 40 mg via SUBCUTANEOUS
  Filled 2019-01-31 (×6): qty 0.4

## 2019-01-31 MED ORDER — IPRATROPIUM-ALBUTEROL 0.5-2.5 (3) MG/3ML IN SOLN
3.0000 mL | Freq: Three times a day (TID) | RESPIRATORY_TRACT | Status: DC
  Administered 2019-01-31 (×2): 3 mL via RESPIRATORY_TRACT
  Filled 2019-01-31 (×2): qty 3

## 2019-01-31 MED ORDER — AMLODIPINE BESYLATE 10 MG PO TABS
10.0000 mg | ORAL_TABLET | Freq: Every day | ORAL | Status: DC
  Administered 2019-01-31 – 2019-02-05 (×6): 10 mg via ORAL
  Filled 2019-01-31 (×6): qty 1

## 2019-01-31 MED ORDER — IPRATROPIUM BROMIDE 0.02 % IN SOLN
0.5000 mg | Freq: Four times a day (QID) | RESPIRATORY_TRACT | Status: DC
  Administered 2019-01-31: 0.5 mg via RESPIRATORY_TRACT
  Filled 2019-01-31: qty 2.5

## 2019-01-31 MED ORDER — ADULT MULTIVITAMIN W/MINERALS CH
1.0000 | ORAL_TABLET | Freq: Every day | ORAL | Status: DC
  Administered 2019-01-31 – 2019-02-05 (×6): 1 via ORAL
  Filled 2019-01-31 (×6): qty 1

## 2019-01-31 MED ORDER — POTASSIUM CHLORIDE 10 MEQ/100ML IV SOLN
10.0000 meq | INTRAVENOUS | Status: AC
  Administered 2019-01-31 (×4): 10 meq via INTRAVENOUS
  Filled 2019-01-31 (×4): qty 100

## 2019-01-31 MED ORDER — FUROSEMIDE 10 MG/ML IJ SOLN
20.0000 mg | Freq: Two times a day (BID) | INTRAMUSCULAR | Status: DC
  Administered 2019-01-31 – 2019-02-04 (×8): 20 mg via INTRAVENOUS
  Filled 2019-01-31 (×9): qty 2

## 2019-01-31 MED ORDER — FUROSEMIDE 10 MG/ML IJ SOLN
20.0000 mg | Freq: Once | INTRAMUSCULAR | Status: AC
  Administered 2019-01-31: 20 mg via INTRAVENOUS
  Filled 2019-01-31: qty 2

## 2019-01-31 MED ORDER — ACETAMINOPHEN 500 MG PO TABS
500.0000 mg | ORAL_TABLET | Freq: Four times a day (QID) | ORAL | Status: DC | PRN
  Administered 2019-01-31: 1000 mg via ORAL
  Filled 2019-01-31: qty 2

## 2019-01-31 MED ORDER — ROSUVASTATIN CALCIUM 20 MG PO TABS
20.0000 mg | ORAL_TABLET | Freq: Every day | ORAL | Status: DC
  Administered 2019-01-31 – 2019-02-04 (×5): 20 mg via ORAL
  Filled 2019-01-31 (×6): qty 1

## 2019-01-31 MED ORDER — ONDANSETRON HCL 4 MG/2ML IJ SOLN
4.0000 mg | Freq: Three times a day (TID) | INTRAMUSCULAR | Status: DC | PRN
  Administered 2019-01-31 – 2019-02-04 (×6): 4 mg via INTRAVENOUS
  Filled 2019-01-31 (×6): qty 2

## 2019-01-31 MED ORDER — IPRATROPIUM BROMIDE 0.02 % IN SOLN
0.5000 mg | Freq: Three times a day (TID) | RESPIRATORY_TRACT | Status: DC
  Administered 2019-02-01 – 2019-02-03 (×5): 0.5 mg via RESPIRATORY_TRACT
  Filled 2019-01-31 (×7): qty 2.5

## 2019-01-31 MED ORDER — SODIUM CHLORIDE 0.9 % IV SOLN
250.0000 mg | Freq: Once | INTRAVENOUS | Status: AC
  Administered 2019-01-31: 250 mg via INTRAVENOUS
  Filled 2019-01-31: qty 4

## 2019-01-31 MED ORDER — HYDROCODONE-ACETAMINOPHEN 5-325 MG PO TABS
1.0000 | ORAL_TABLET | Freq: Four times a day (QID) | ORAL | Status: DC | PRN
  Administered 2019-01-31 – 2019-02-05 (×16): 1 via ORAL
  Filled 2019-01-31 (×16): qty 1

## 2019-01-31 MED ORDER — METHOCARBAMOL 500 MG PO TABS
1000.0000 mg | ORAL_TABLET | Freq: Three times a day (TID) | ORAL | Status: DC | PRN
  Administered 2019-01-31 – 2019-02-05 (×11): 1000 mg via ORAL
  Filled 2019-01-31 (×11): qty 2

## 2019-01-31 MED ORDER — ALTEPLASE 2 MG IJ SOLR
2.0000 mg | Freq: Once | INTRAMUSCULAR | Status: AC
  Administered 2019-01-31: 2 mg
  Filled 2019-01-31: qty 2

## 2019-01-31 MED ORDER — FLUTICASONE PROPIONATE 50 MCG/ACT NA SUSP: 1.0000 | Freq: Every day | NASAL | Status: DC | PRN

## 2019-01-31 MED ORDER — AMITRIPTYLINE HCL 25 MG PO TABS
25.0000 mg | ORAL_TABLET | Freq: Two times a day (BID) | ORAL | Status: DC
  Administered 2019-01-31 – 2019-02-04 (×9): 25 mg via ORAL
  Filled 2019-01-31 (×12): qty 1

## 2019-01-31 MED ORDER — IPRATROPIUM-ALBUTEROL 0.5-2.5 (3) MG/3ML IN SOLN
3.0000 mL | Freq: Four times a day (QID) | RESPIRATORY_TRACT | Status: DC
  Administered 2019-01-31: 3 mL via RESPIRATORY_TRACT
  Filled 2019-01-31: qty 3

## 2019-01-31 MED ORDER — QUETIAPINE FUMARATE 100 MG PO TABS
100.0000 mg | ORAL_TABLET | Freq: Every day | ORAL | Status: DC
  Administered 2019-01-31 – 2019-02-04 (×6): 100 mg via ORAL
  Filled 2019-01-31 (×6): qty 1

## 2019-01-31 NOTE — Progress Notes (Signed)
Pt. VS RR- 23, pulse- 114, desated on RA to 84%, placed on [place] to 3L now 93%. Co of SOB and dizzness. MD made aware.

## 2019-01-31 NOTE — Progress Notes (Signed)
  Echocardiogram 2D Echocardiogram has been performed.  Darlina Sicilian M 01/31/2019, 9:45 AM

## 2019-01-31 NOTE — ED Notes (Signed)
ED TO INPATIENT HANDOFF REPORT  ED Nurse Name and Phone #: Loleta Books [phone]  S Name/Age/Gender [name] 53 y.o. female Room/Bed: WA16/WA16  Code Status   Code Status: Prior  Home/SNF/Other Home Patient oriented to: self, place, time and situation Is this baseline? Yes   Triage Complete: Triage complete  Chief Complaint SOB, Chest tightness, Headache  Triage Note Patient c/o SOB, dry cough, chest tightness, and a headache x 8 days. Patient states she did a televideo visit with her physician. Patient states her symptoms are worsening. Patient was sent to be tested for covid-19.   Allergies Allergies  Allergen Reactions  . Aspirin Anaphylaxis  . Doxycycline Anaphylaxis and Other (See Comments)    Joint damage also  . Imitrex [Sumatriptan Base] Other (See Comments)    Severe hypertension  . Iodinated Diagnostic Agents Anaphylaxis  . Lidocaine Anaphylaxis    Pt states she does well with Marcaine/Bupivicaine without problem  . Prednisone Other (See Comments)    She goes crazy  . Sulfa Antibiotics Anaphylaxis  . Sulfasalazine Anaphylaxis  . Sumatriptan Other (See Comments)    Felt like she was "having a stroke", heart rate and blood pressure "went through the roof"  . Tramadol Other (See Comments)    seizures  . Levofloxacin Other (See Comments)    Joints hurt  . Amoxicillin-Pot Clavulanate Other (See Comments)    Pt can't remember what type of reaction she had, but states her pharmacy has it listed in their allergy list.  . Caffeine-Sodium Benzoate     Other reaction(s): Dizziness (intolerance)  . Prochlorperazine Other (See Comments)    Tonic/clonic seizure  . Latex Rash  . Levofloxacin Rash and Other (See Comments)    Joints hurt  . Metrizamide Other (See Comments)  . Nsaids Rash and Other (See Comments)    GI bleed  . Tolmetin Rash    Level of Care/Admitting Diagnosis ED Disposition    ED Disposition Condition [facility] Area:  [place] [[id number]]  Level of Care: Telemetry [5]  Admit to tele based on following criteria: Other see comments  Comments: SOB  Covid Evaluation: Person Under Investigation (PUI)  Isolation Risk Level: Low Risk/Droplet (Less than 4L [place] supplementation)  Diagnosis: SOB (shortness of breath) [149702]  Admitting Physician: Bonnell Public [[id number]]  Attending Physician: Dana Allan I [[id number]]  PT Class (Do Not Modify): Observation [104]  PT Acc Code (Do Not Modify): Observation [10022]       B Medical/Surgery History Past Medical History:  Diagnosis Date  . Anorexia   . Anxiety   . Depression   . DJD (degenerative joint disease) of cervical spine   . Endometriosis   . ETOH abuse    sober 3 1/2 years  . Migraines   . PICC (peripherally inserted central catheter) in place    rt neck  . Psychiatric pseudoseizure   . Seizures ([place])    Past Surgical History:  Procedure Laterality Date  . ABDOMINAL HYSTERECTOMY    . ABDOMINAL SURGERY    . APPENDECTOMY    . CARPAL TUNNEL RELEASE     2010  . CHOLECYSTECTOMY    . ESOPHAGOGASTRODUODENOSCOPY N/A 10/06/2014   Procedure: ESOPHAGOGASTRODUODENOSCOPY (EGD);  Surgeon: Inda Castle, MD;  Location: [facility];  Service: Endoscopy;  Laterality: N/A;  . KNEE SURGERY    . laproscopy    . NASAL SINUS SURGERY       A IV Location/Drains/Wounds Patient Lines/Drains/Airways  Status   Active Line/Drains/Airways    Name:   Placement date:   Placement time:   Site:   Days:   Peripheral IV 01/30/19 Left Forearm   01/30/19    2016    Forearm   1          Intake/Output Last 24 hours  Intake/Output Summary (Last 24 hours) at 01/31/2019 0039 Last data filed at 01/30/2019 2159 Gross per 24 hour  Intake 1100 ml  Output -  Net 1100 ml    Labs/Imaging Results for orders placed or performed during the hospital encounter of 01/30/19 (from the past 48 hour(s))  CBC with Differential     Status: Abnormal    Collection Time: 01/30/19  8:05 PM  Result Value Ref Range   WBC 12.4 (H) 4.0 - 10.5 K/uL   RBC 4.85 3.87 - 5.11 MIL/uL   Hemoglobin 15.9 (H) 12.0 - 15.0 g/dL   HCT 46.2 (H) 36.0 - 46.0 %   MCV 95.3 80.0 - 100.0 fL   MCH 32.8 26.0 - 34.0 pg   MCHC 34.4 30.0 - 36.0 g/dL   RDW 13.6 11.5 - 15.5 %   Platelets 377 150 - 400 K/uL   nRBC 0.0 0.0 - 0.2 %   Neutrophils Relative % 58 %   Neutro Abs 7.4 1.7 - 7.7 K/uL   Lymphocytes Relative 34 %   Lymphs Abs 4.3 (H) 0.7 - 4.0 K/uL   Monocytes Relative 5 %   Monocytes Absolute 0.6 0.1 - 1.0 K/uL   Eosinophils Relative 1 %   Eosinophils Absolute 0.1 0.0 - 0.5 K/uL   Basophils Relative 1 %   Basophils Absolute 0.1 0.0 - 0.1 K/uL   Immature Granulocytes 1 %   Abs Immature Granulocytes 0.06 0.00 - 0.07 K/uL    Comment: Performed at [facility], [place] [address], [place], [place] [address]  Comprehensive metabolic panel     Status: Abnormal   Collection Time: 01/30/19  8:05 PM  Result Value Ref Range   Sodium 137 135 - 145 mmol/L   Potassium 4.0 3.5 - 5.1 mmol/L   Chloride 96 (L) 98 - 111 mmol/L   CO2 22 22 - 32 mmol/L   Glucose, Bld 93 70 - 99 mg/dL   BUN 12 6 - 20 mg/dL   Creatinine, Ser 1.00 0.44 - 1.00 mg/dL   Calcium 10.3 8.9 - 10.3 mg/dL   Total Protein 8.2 (H) 6.5 - 8.1 g/dL   Albumin 4.7 3.5 - 5.0 g/dL   AST 40 15 - 41 U/L   ALT 33 0 - 44 U/L   Alkaline Phosphatase 83 38 - 126 U/L   Total Bilirubin 1.7 (H) 0.3 - 1.2 mg/dL   GFR calc non Af Amer >60 >60 mL/min   GFR calc Af Amer >60 >60 mL/min   Anion gap 19 (H) 5 - 15    Comment: Performed at [facility], [place] [address] [place], [place] [address]  Troponin I - ONCE - STAT     Status: None   Collection Time: 01/30/19  8:05 PM  Result Value Ref Range   Troponin I <0.03 <0.03 ng/mL    Comment: Performed at [facility], [place] [address], [place], [place] [address]  D-dimer, quantitative (not at [facility])     Status: None    Collection Time: 01/30/19  8:05 PM  Result Value Ref Range   D-Dimer, Quant <0.27 0.00 - 0.50 ug/mL-FEU  Comment: REPEATED TO VERIFY (NOTE) At the manufacturer cut-off of 0.50 ug/mL FEU, this assay has been documented to exclude PE with a sensitivity and negative predictive value of 97 to 99%.  At this time, this assay has not been approved by the FDA to exclude DVT/VTE. Results should be correlated with clinical presentation. Performed at [facility], [place] [address], [place], [place] [address]   SARS Coronavirus 2 (CEPHEID- Performed in [facility] hospital lab), Hosp Order     Status: None   Collection Time: 01/30/19 10:00 PM  Result Value Ref Range   SARS Coronavirus 2 NEGATIVE NEGATIVE    Comment: (NOTE) If result is NEGATIVE SARS-CoV-2 target nucleic acids are NOT DETECTED. The SARS-CoV-2 RNA is generally detectable in upper and lower  respiratory specimens during the acute phase of infection. The lowest  concentration of SARS-CoV-2 viral copies this assay can detect is 250  copies / mL. A negative result does not preclude SARS-CoV-2 infection  and should not be used as the sole basis for treatment or other  patient management decisions.  A negative result may occur with  improper specimen collection / handling, submission of specimen other  than nasopharyngeal swab, presence of viral mutation(s) within the  areas targeted by this assay, and inadequate number of viral copies  (<250 copies / mL). A negative result must be combined with clinical  observations, patient history, and epidemiological information. If result is POSITIVE SARS-CoV-2 target nucleic acids are DETECTED. The SARS-CoV-2 RNA is generally detectable in upper and lower  respiratory specimens dur ing the acute phase of infection.  Positive  results are indicative of active infection with SARS-CoV-2.  Clinical  correlation with patient history and other diagnostic information is   necessary to determine patient infection status.  Positive results do  not rule out bacterial infection or co-infection with other viruses. If result is PRESUMPTIVE POSTIVE SARS-CoV-2 nucleic acids MAY BE PRESENT.   A presumptive positive result was obtained on the submitted specimen  and confirmed on repeat testing.  While 2019 novel coronavirus  (SARS-CoV-2) nucleic acids may be present in the submitted sample  additional confirmatory testing may be necessary for epidemiological  and / or clinical management purposes  to differentiate between  SARS-CoV-2 and other Sarbecovirus currently known to infect humans.  If clinically indicated additional testing with an alternate test  methodology [phone]) is advised. The SARS-CoV-2 RNA is generally  detectable in upper and lower respiratory sp ecimens during the acute  phase of infection. The expected result is Negative. Fact Sheet for Patients:  StrictlyIdeas.no Fact Sheet for Healthcare Providers: BankingDealers.co.za This test is not yet approved or cleared by the [place] FDA and has been authorized for detection and/or diagnosis of SARS-CoV-2 by FDA under an Emergency Use Authorization (EUA).  This EUA will remain in effect (meaning this test can be used) for the duration of the COVID-19 declaration under Section 564(b)(1) of the Act, 21 U.S.C. section 360bbb-3(b)(1), unless the authorization is terminated or revoked sooner. Performed at [facility], [place] [address] [place], [place] [address]   Lactic acid, plasma     Status: None   Collection Time: 01/30/19 10:15 PM  Result Value Ref Range   Lactic Acid, Venous 0.9 0.5 - 1.9 mmol/L    Comment: Performed at [facility], [place] [address] [place], [place] [address]  Blood gas, venous     Status: Abnormal   Collection Time: 01/30/19 10:17 PM  Result  Value Ref Range   O2 Content 2.0 L/min    Delivery systems NASAL CANNULA    pH, Ven 7.411 7.250 - 7.430   pCO2, Ven 47.9 44.0 - 60.0 mmHg   pO2, Ven 64.9 (H) 32.0 - 45.0 mmHg   Bicarbonate 29.8 (H) 20.0 - 28.0 mmol/L   Acid-Base Excess 4.7 (H) 0.0 - 2.0 mmol/L   O2 Saturation 92.1 %   Patient temperature 98.8    Collection site VEIN    Drawn by DRAWN BY RN    Sample type VENOUS     Comment: Performed at [facility] [address], [place], [place] [address]  Brain natriuretic peptide     Status: None   Collection Time: 01/30/19 11:14 PM  Result Value Ref Range   B Natriuretic Peptide 13.1 0.0 - 100.0 pg/mL    Comment: Performed at [facility], [place] [address], [place], [place] [address]   Dg Chest Portable 1 View  Result Date: 01/30/2019 CLINICAL DATA:  Initial evaluation for acute shortness of breath, cough. EXAM: PORTABLE CHEST 1 VIEW COMPARISON:  Prior radiograph from 05/24/2018 FINDINGS: Right-sided Port-A-Cath in place. Stable cardiomegaly. Mediastinal silhouette normal. Lungs normally inflated. No focal infiltrates. No edema or effusion. Minimal left basilar subsegmental atelectasis. No pneumothorax. No acute osseous finding. IMPRESSION: 1. Minimal left basilar subsegmental atelectasis. No other active cardiopulmonary disease. 2. Stable cardiomegaly without pulmonary edema. Electronically Signed   By: Jeannine Boga M.D.   On: 01/30/2019 19:51    Pending Labs Unresulted Labs (From admission, onward)    Start     Ordered   01/31/19 0032  HIV antibody (Routine Testing)  Once,   R     01/31/19 0032   01/31/19 0032  Magnesium  Once,   R     01/31/19 0032   01/31/19 0032  Phosphorus  Once,   R     01/31/19 0032   01/31/19 0032  TSH  Once,   R     01/31/19 0032   01/31/19 0029  Blood gas, arterial  Once,   R    Comments:  On room air    01/31/19 0029   Signed and Held  CBC  (enoxaparin (LOVENOX)    CrCl >/= 30 ml/min)  Once,   R    Comments:  Baseline for enoxaparin therapy  IF NOT ALREADY DRAWN.  Notify MD if PLT < 100 K.    Signed and Held   Signed and Held  Creatinine, serum  (enoxaparin (LOVENOX)    CrCl >/= 30 ml/min)  Once,   R    Comments:  Baseline for enoxaparin therapy IF NOT ALREADY DRAWN.    Signed and Held   Signed and Held  Creatinine, serum  (enoxaparin (LOVENOX)    CrCl >/= 30 ml/min)  Weekly,   R    Comments:  while on enoxaparin therapy    Signed and Held   Signed and Held  Basic metabolic panel  Tomorrow morning,   R     Signed and Held   Signed and Held  CBC  Tomorrow morning,   R     Signed and Held          Vitals/Pain Today's Vitals   01/30/19 2222 01/30/19 2230 01/30/19 2300 01/30/19 2330  BP:  136/87 122/61 130/85  Pulse:  90 90 97  Resp:  19 16 (!) 38  Temp:      TempSrc:      SpO2:  96% 95% 98% 94%  Weight:      Height:      PainSc:        Isolation Precautions Droplet and Contact precautions  Medications Medications  amLODipine (NORVASC) tablet 10 mg (has no administration in time range)  acetaminophen (TYLENOL) tablet 500-1,000 mg (has no administration in time range)  rosuvastatin (CRESTOR) tablet 20 mg (has no administration in time range)  amitriptyline (ELAVIL) tablet 25 mg (has no administration in time range)  QUEtiapine (SEROQUEL) tablet 100 mg (has no administration in time range)  clonazePAM (KLONOPIN) tablet 0.5 mg (has no administration in time range)  methocarbamol (ROBAXIN) tablet 1,000 mg (has no administration in time range)  multivitamin with minerals tablet 1 tablet (has no administration in time range)  fluticasone (FLONASE) 50 MCG/ACT nasal spray 1 spray (has no administration in time range)  methylPREDNISolone sodium succinate (SOLU-MEDROL) 250 mg in sodium chloride 0.9 % 50 mL IVPB (has no administration in time range)  valproate (DEPACON) 1,000 mg in dextrose 5 % 50 mL IVPB (has no administration in time range)  ipratropium-albuterol (DUONEB) 0.5-2.5 (3) MG/3ML nebulizer solution 3 mL  (has no administration in time range)  budesonide (PULMICORT) nebulizer solution 0.25 mg (has no administration in time range)  sodium chloride 0.9 % bolus 1,000 mL (0 mLs Intravenous Stopped 01/30/19 2159)  metoCLOPramide (REGLAN) injection 10 mg (10 mg Intravenous Given 01/30/19 2044)  magnesium sulfate IVPB 1 g 100 mL (0 g Intravenous Stopped 01/30/19 2159)  diphenhydrAMINE (BENADRYL) injection 25 mg (25 mg Intravenous Given 01/30/19 2044)  albuterol (VENTOLIN HFA) 108 (90 Base) MCG/ACT inhaler 4 puff (4 puffs Inhalation Given 01/30/19 2222)    Mobility walks with person assist Low fall risk   Focused Assessments Neuro Assessment Handoff: Headaches  Cardiac Rhythm: Sinus tachycardia       Recommendations: See Admitting Provider Note  Report given to: Mechele Claude RN Additional Notes:

## 2019-01-31 NOTE — Progress Notes (Signed)
Pt refused arterial blood gas. Pt stated the venous blood gas drawn earlier would have to be good enough.  Unable to find MD in Dr dictation room, made RN aware and requested she inform the MD.

## 2019-01-31 NOTE — Progress Notes (Signed)
Patient refused Arterial Blood Gas draw.  PCP was notified

## 2019-01-31 NOTE — Progress Notes (Signed)
[facility]  PROGRESS NOTE  [name] EGB:[id number] DOB: [birth year] DOA: 01/30/2019 PCP: [name], MD   Brief HPI:   53 year old female with history of migraine, alcohol abuse, endometriosis, depression, anxiety, pseudoseizure was seen by her PCP for shortness of breath.  Chest x-ray was negative.  COVID-19 test came back negative.D-dimer less than 0.27.  Patient became hypoxic with O2 sats dropped to 80s.  Subjective   Patient seen and examined, complains of shortness of breath.  Echocardiogram ordered today   Assessment/Plan:     1. Dyspnea-unclear etiology, echocardiogram shows impaired relaxation of left ventricle, d-dimer less than 0.27.  On exam patient does have bibasilar crackles.Will start Lasix 20 mg IV every 12 hours.  Likely has acute diastolic CHF, exacerbated after patient received IV fluid bolus in the ED.  Patient also is a smoker , has never been diagnosed with COPD.  Started on Solu-Medrol.  ABG shows chronic respiratory and metabolic alkalosis.  Will discontinue albuterol nebulizer, continue with ipratropium nebulizer every 6 hours.COVID-19 test negative.  2. Migraines-continue Depakote, started on as needed Vicodin.  3. History of depression/anxiety-continue amitriptyline, lorazepam  4. Hypokalemia-potassium was 2.8, will replace potassium, check BMP in a.m.       CBG: No results for input(s): GLUCAP in the last 168 hours.  CBC: Recent Labs  Lab 01/30/19 2005 01/31/19 0141 01/31/19 0655  WBC 12.4* 10.6* 8.3  NEUTROABS 7.4  --   --   HGB 15.9* 16.1* 13.7  HCT 46.2* 48.0* 40.5  MCV 95.3 94.3 97.4  PLT 377 288 371    Basic Metabolic Panel: Recent Labs  Lab 01/30/19 2005 01/30/19 2226 01/31/19 0141 01/31/19 0655 01/31/19 1426  NA 137  --   --  136  --   K 4.0  --   --  2.8*  --   CL 96*  --   --  98  --   CO2 22  --   --  25  --   GLUCOSE 93  --   --  132*  --   BUN 12  --   --  9  --   CREATININE 1.00  --  0.94 0.91  --    CALCIUM 10.3  --   --  8.5*  --   MG  --  2.5*  --   --  2.2  PHOS  --  4.9*  --   --   --      DVT prophylaxis: Lovenox  Code Status: Full code  Family Communication: No family at bedside  Disposition Plan: likely home when medically ready for discharge     Consultants:    Procedures:     Antibiotics:   Anti-infectives (From admission, onward)   None       Objective   Vitals:   01/31/19 0417 01/31/19 0832 01/31/19 1512 01/31/19 1517  BP: 125/79  140/83 (!) 141/80  Pulse: 86  (!) 112 (!) 110  Resp: 18  (!) 23 17  Temp: 98.5 F (36.9 C)  98.8 F (37.1 C) 98.6 F (37 C)  TempSrc: Oral  Oral Oral  SpO2: 94% 96% 91% 95%  Weight:      Height:        Intake/Output Summary (Last 24 hours) at 01/31/2019 1711 Last data filed at 01/31/2019 1619 Gross per 24 hour  Intake 1440 ml  Output -  Net 1440 ml   Filed Weights   01/30/19 1836 01/31/19 0200  Weight: 74.8 kg 80.6 kg  Physical Examination:    General: Appears in no acute distress  Cardiovascular: S1-S2, regular, no murmur auscultated  Respiratory: Bibasilar crackles auscultated  Abdomen: Abdomen is soft, nontender, no organomegaly  Extremities: No edema in the lower extremities  Neurologic: Alert, oriented x3, no focal deficit noted     Data Reviewed: I have personally reviewed following labs and imaging studies   Recent Results (from the past 240 hour(s))  SARS Coronavirus 2 (CEPHEID- Performed in [facility] hospital lab), Hosp Order     Status: None   Collection Time: 01/30/19 10:00 PM  Result Value Ref Range Status   SARS Coronavirus 2 NEGATIVE NEGATIVE Final    Comment: (NOTE) If result is NEGATIVE SARS-CoV-2 target nucleic acids are NOT DETECTED. The SARS-CoV-2 RNA is generally detectable in upper and lower  respiratory specimens during the acute phase of infection. The lowest  concentration of SARS-CoV-2 viral copies this assay can detect is 250  copies / mL. A  negative result does not preclude SARS-CoV-2 infection  and should not be used as the sole basis for treatment or other  patient management decisions.  A negative result may occur with  improper specimen collection / handling, submission of specimen other  than nasopharyngeal swab, presence of viral mutation(s) within the  areas targeted by this assay, and inadequate number of viral copies  (<250 copies / mL). A negative result must be combined with clinical  observations, patient history, and epidemiological information. If result is POSITIVE SARS-CoV-2 target nucleic acids are DETECTED. The SARS-CoV-2 RNA is generally detectable in upper and lower  respiratory specimens dur ing the acute phase of infection.  Positive  results are indicative of active infection with SARS-CoV-2.  Clinical  correlation with patient history and other diagnostic information is  necessary to determine patient infection status.  Positive results do  not rule out bacterial infection or co-infection with other viruses. If result is PRESUMPTIVE POSTIVE SARS-CoV-2 nucleic acids MAY BE PRESENT.   A presumptive positive result was obtained on the submitted specimen  and confirmed on repeat testing.  While 2019 novel coronavirus  (SARS-CoV-2) nucleic acids may be present in the submitted sample  additional confirmatory testing may be necessary for epidemiological  and / or clinical management purposes  to differentiate between  SARS-CoV-2 and other Sarbecovirus currently known to infect humans.  If clinically indicated additional testing with an alternate test  methodology [phone]) is advised. The SARS-CoV-2 RNA is generally  detectable in upper and lower respiratory sp ecimens during the acute  phase of infection. The expected result is Negative. Fact Sheet for Patients:  StrictlyIdeas.no Fact Sheet for Healthcare Providers: BankingDealers.co.za This test is not  yet approved or cleared by the [place] FDA and has been authorized for detection and/or diagnosis of SARS-CoV-2 by FDA under an Emergency Use Authorization (EUA).  This EUA will remain in effect (meaning this test can be used) for the duration of the COVID-19 declaration under Section 564(b)(1) of the Act, 21 U.S.C. section 360bbb-3(b)(1), unless the authorization is terminated or revoked sooner. Performed at [facility], [place] [address], [place], [place] [address]      Liver Function Tests: Recent Labs  Lab 01/30/19 2005  AST 40  ALT 33  ALKPHOS 83  BILITOT 1.7*  PROT 8.2*  ALBUMIN 4.7   No results for input(s): LIPASE, AMYLASE in the last 168 hours. No results for input(s): AMMONIA in the last 168 hours.  Cardiac Enzymes: Recent Labs  Lab 01/30/19  2005  TROPONINI <0.03   BNP (last 3 results) Recent Labs    01/30/19 2314  BNP 13.1    ProBNP (last 3 results) No results for input(s): PROBNP in the last 8760 hours.    Studies: Dg Chest Portable 1 View  Result Date: 01/30/2019 CLINICAL DATA:  Initial evaluation for acute shortness of breath, cough. EXAM: PORTABLE CHEST 1 VIEW COMPARISON:  Prior radiograph from 05/24/2018 FINDINGS: Right-sided Port-A-Cath in place. Stable cardiomegaly. Mediastinal silhouette normal. Lungs normally inflated. No focal infiltrates. No edema or effusion. Minimal left basilar subsegmental atelectasis. No pneumothorax. No acute osseous finding. IMPRESSION: 1. Minimal left basilar subsegmental atelectasis. No other active cardiopulmonary disease. 2. Stable cardiomegaly without pulmonary edema. Electronically Signed   By: Jeannine Boga M.D.   On: 01/30/2019 19:51    Scheduled Meds: . amitriptyline  25 mg Oral BID  . amLODipine  10 mg Oral Daily  . budesonide (PULMICORT) nebulizer solution  0.25 mg Nebulization BID  . clonazePAM  0.5 mg Oral TID  . enoxaparin (LOVENOX) injection  40 mg Subcutaneous Daily  .  ipratropium  0.5 mg Nebulization Q6H  . methylPREDNISolone (SOLU-MEDROL) injection  40 mg Intravenous Q6H  . multivitamin with minerals  1 tablet Oral Daily  . [START ON 02/01/2019] pneumococcal 23 valent vaccine  0.5 mL Intramuscular Tomorrow-1000  . QUEtiapine  100 mg Oral QHS  . rosuvastatin  20 mg Oral QHS    Admission status: Observation: Based on patients clinical presentation and evaluation of above clinical data, I have made determination that patient meets Inpatient criteria at this time.  Time spent: 20 min  [facility] Pager [phone]. If 7PM-7AM, please contact night-coverage at [email or web address], Office  [phone]  password [place]  01/31/2019, 5:11 PM  LOS: 0 days

## 2019-01-31 NOTE — Progress Notes (Signed)
Informed by CCMD, that patient had a 6 second run of Portal. MD made aware.

## 2019-01-31 NOTE — Progress Notes (Signed)
Patient stated that she would like her Port-a-cath accessed. PCP was notified

## 2019-01-31 NOTE — Care Management Important Message (Signed)
Important Message  Patient Details  Name: [name] MRN: [id number] Date of Birth: [birth year]   Medicare Important Message Given:  Yes    Wende Neighbors, LCSW 01/31/2019, 12:29 PM

## 2019-02-01 DIAGNOSIS — Z6830 Body mass index (BMI) 30.0-30.9, adult: Secondary | ICD-10-CM | POA: Diagnosis not present

## 2019-02-01 DIAGNOSIS — R918 Other nonspecific abnormal finding of lung field: Secondary | ICD-10-CM | POA: Diagnosis not present

## 2019-02-01 DIAGNOSIS — Z882 Allergy status to sulfonamides status: Secondary | ICD-10-CM | POA: Diagnosis not present

## 2019-02-01 DIAGNOSIS — J849 Interstitial pulmonary disease, unspecified: Secondary | ICD-10-CM | POA: Diagnosis not present

## 2019-02-01 DIAGNOSIS — F17210 Nicotine dependence, cigarettes, uncomplicated: Secondary | ICD-10-CM | POA: Diagnosis present

## 2019-02-01 DIAGNOSIS — J449 Chronic obstructive pulmonary disease, unspecified: Secondary | ICD-10-CM | POA: Diagnosis not present

## 2019-02-01 DIAGNOSIS — E876 Hypokalemia: Secondary | ICD-10-CM

## 2019-02-01 DIAGNOSIS — E873 Alkalosis: Secondary | ICD-10-CM | POA: Diagnosis not present

## 2019-02-01 DIAGNOSIS — I5031 Acute diastolic (congestive) heart failure: Secondary | ICD-10-CM | POA: Diagnosis not present

## 2019-02-01 DIAGNOSIS — M47812 Spondylosis without myelopathy or radiculopathy, cervical region: Secondary | ICD-10-CM | POA: Diagnosis present

## 2019-02-01 DIAGNOSIS — Z23 Encounter for immunization: Secondary | ICD-10-CM | POA: Diagnosis not present

## 2019-02-01 DIAGNOSIS — F1010 Alcohol abuse, uncomplicated: Secondary | ICD-10-CM | POA: Diagnosis present

## 2019-02-01 DIAGNOSIS — R112 Nausea with vomiting, unspecified: Secondary | ICD-10-CM | POA: Diagnosis present

## 2019-02-01 DIAGNOSIS — K580 Irritable bowel syndrome with diarrhea: Secondary | ICD-10-CM | POA: Diagnosis present

## 2019-02-01 DIAGNOSIS — D72829 Elevated white blood cell count, unspecified: Secondary | ICD-10-CM | POA: Diagnosis present

## 2019-02-01 DIAGNOSIS — F445 Conversion disorder with seizures or convulsions: Secondary | ICD-10-CM | POA: Diagnosis present

## 2019-02-01 DIAGNOSIS — Z20828 Contact with and (suspected) exposure to other viral communicable diseases: Secondary | ICD-10-CM | POA: Diagnosis not present

## 2019-02-01 DIAGNOSIS — R0602 Shortness of breath: Secondary | ICD-10-CM | POA: Diagnosis not present

## 2019-02-01 DIAGNOSIS — R079 Chest pain, unspecified: Secondary | ICD-10-CM | POA: Diagnosis not present

## 2019-02-01 DIAGNOSIS — J9601 Acute respiratory failure with hypoxia: Secondary | ICD-10-CM | POA: Diagnosis not present

## 2019-02-01 DIAGNOSIS — R0902 Hypoxemia: Secondary | ICD-10-CM | POA: Diagnosis not present

## 2019-02-01 DIAGNOSIS — R0600 Dyspnea, unspecified: Secondary | ICD-10-CM | POA: Diagnosis not present

## 2019-02-01 DIAGNOSIS — E669 Obesity, unspecified: Secondary | ICD-10-CM | POA: Diagnosis not present

## 2019-02-01 DIAGNOSIS — R9431 Abnormal electrocardiogram [ECG] [EKG]: Secondary | ICD-10-CM | POA: Diagnosis present

## 2019-02-01 DIAGNOSIS — I110 Hypertensive heart disease with heart failure: Secondary | ICD-10-CM | POA: Diagnosis not present

## 2019-02-01 DIAGNOSIS — F419 Anxiety disorder, unspecified: Secondary | ICD-10-CM | POA: Diagnosis not present

## 2019-02-01 DIAGNOSIS — G43909 Migraine, unspecified, not intractable, without status migrainosus: Secondary | ICD-10-CM | POA: Diagnosis not present

## 2019-02-01 DIAGNOSIS — E785 Hyperlipidemia, unspecified: Secondary | ICD-10-CM | POA: Diagnosis present

## 2019-02-01 DIAGNOSIS — F329 Major depressive disorder, single episode, unspecified: Secondary | ICD-10-CM | POA: Diagnosis not present

## 2019-02-01 DIAGNOSIS — Z888 Allergy status to other drugs, medicaments and biological substances status: Secondary | ICD-10-CM | POA: Diagnosis not present

## 2019-02-01 LAB — BASIC METABOLIC PANEL
Anion gap: 9 (ref 5–15)
BUN: 13 mg/dL (ref 6–20)
CO2: 31 mmol/L (ref 22–32)
Calcium: 8.5 mg/dL — ABNORMAL LOW (ref 8.9–10.3)
Chloride: 97 mmol/L — ABNORMAL LOW (ref 98–111)
Creatinine, Ser: 0.72 mg/dL (ref 0.44–1.00)
GFR calc Af Amer: >60 mL/min (ref >60)
GFR calc non Af Amer: >60 mL/min (ref >60)
Glucose, Bld: 152 mg/dL — ABNORMAL HIGH (ref 70–99)
Potassium: 3.3 mmol/L — ABNORMAL LOW (ref 3.5–5.1)
Sodium: 137 mmol/L (ref 135–145)

## 2019-02-01 LAB — POTASSIUM: Potassium: 3.1 mmol/L — ABNORMAL LOW (ref 3.5–5.1)

## 2019-02-01 MED ORDER — POTASSIUM CHLORIDE CRYS ER 20 MEQ PO TBCR
40.0000 meq | EXTENDED_RELEASE_TABLET | Freq: Once | ORAL | Status: AC
  Administered 2019-02-01: 40 meq via ORAL
  Filled 2019-02-01: qty 2

## 2019-02-01 MED ORDER — METHYLPREDNISOLONE SODIUM SUCC 40 MG IJ SOLR
40.0000 mg | Freq: Two times a day (BID) | INTRAMUSCULAR | Status: AC
  Administered 2019-02-02 (×2): 40 mg via INTRAVENOUS
  Filled 2019-02-01 (×2): qty 1

## 2019-02-01 MED ORDER — FUROSEMIDE 10 MG/ML IJ SOLN
20.0000 mg | Freq: Once | INTRAMUSCULAR | Status: AC
  Administered 2019-02-01: 20 mg via INTRAVENOUS

## 2019-02-01 NOTE — Progress Notes (Signed)
[facility]  PROGRESS NOTE  [name] RUE:[id number] DOB: [birth year] DOA: 01/30/2019 PCP: [name], MD   Brief HPI:   53 year old female with history of migraine, alcohol abuse, endometriosis, depression, anxiety, pseudoseizure was seen by her PCP for shortness of breath.  Chest x-ray was negative.  COVID-19 test came back negative.D-dimer less than 0.27.  Patient became hypoxic with O2 sats dropped to 80s.  Subjective   Patient seen and examined, still complains of shortness of breath.  Patient is still requiring oxygen via nasal cannula.   Assessment/Plan:     1. Dyspnea-unclear etiology, echocardiogram shows impaired relaxation of left ventricle, d-dimer less than 0.27.  On exam patient does have bibasilar crackles.patient was started on Lasix 20 mg IV every 12 hours and is -2.6 L since past 24 hours.    Likely has acute diastolic CHF, exacerbated after patient received IV fluid bolus in the ED.  Patient also is a smoker , has never been diagnosed with COPD.  Started on Solu-Medrol.  ABG shows chronic respiratory and metabolic alkalosis.  Discontinue albuterol nebulizer due to tachycardia and tachypnea, patient has history of anxiety, continue with ipratropium nebulizer every 6 hours.COVID-19 test negative.  Discussed with pulmonologist Dr. Lynetta Mare, who recommends to continue same treatment.  Patient has an appointment to see pulmonologist as outpatient.  Lasix given to order for continuous pulse ox of  2. Migraines-continue Depakote, started on as needed Vicodin.  3. History of depression/anxiety-continue amitriptyline, lorazepam  4. Hypokalemia-patient's serum potassium is 3.3 this morning.  We will replace K. Dur 40 mEq p.o. x1.      CBG: No results for input(s): GLUCAP in the last 168 hours.  CBC: Recent Labs  Lab 01/30/19 2005 01/31/19 0141 01/31/19 0655  WBC 12.4* 10.6* 8.3  NEUTROABS 7.4  --   --   HGB 15.9* 16.1* 13.7  HCT 46.2* 48.0* 40.5  MCV  95.3 94.3 97.4  PLT 377 288 147    Basic Metabolic Panel: Recent Labs  Lab 01/30/19 2005 01/30/19 2226 01/31/19 0141 01/31/19 0655 01/31/19 1426 02/01/19 0401  NA 137  --   --  136  --  137  K 4.0  --   --  2.8*  --  3.3*  CL 96*  --   --  98  --  97*  CO2 22  --   --  25  --  31  GLUCOSE 93  --   --  132*  --  152*  BUN 12  --   --  9  --  13  CREATININE 1.00  --  0.94 0.91  --  0.72  CALCIUM 10.3  --   --  8.5*  --  8.5*  MG  --  2.5*  --   --  2.2  --   PHOS  --  4.9*  --   --   --   --      DVT prophylaxis: Lovenox  Code Status: Full code  Family Communication: No family at bedside  Disposition Plan: likely home when medically ready for discharge     Consultants:    Procedures:     Antibiotics:   Anti-infectives (From admission, onward)   None       Objective   Vitals:   01/31/19 2006 01/31/19 2039 02/01/19 0600 02/01/19 0749  BP:  115/66 115/71   Pulse:  (!) 103 84 86  Resp:  20 18 18   Temp:  98.6 F (37 C) 98.3 F (  36.8 C)   TempSrc:  Oral Oral   SpO2: 96% 93% 92% 91%  Weight:      Height:        Intake/Output Summary (Last 24 hours) at 02/01/2019 1213 Last data filed at 02/01/2019 1143 Gross per 24 hour  Intake 700 ml  Output 3300 ml  Net -2600 ml   Filed Weights   01/30/19 1836 01/31/19 0200  Weight: 74.8 kg 80.6 kg     Physical Examination:   General:  Appears in no acute distress  Cardiovascular: S1-S2, regular, no murmur auscultated  Respiratory: Bibasilar crackles auscultated  Abdomen: Abdomen is soft, nontender, no organomegaly  Extremities: No edema of the lower extremities           Data Reviewed: I have personally reviewed following labs and imaging studies   Recent Results (from the past 240 hour(s))  SARS Coronavirus 2 (CEPHEID- Performed in [facility] hospital lab), Hosp Order     Status: None   Collection Time: 01/30/19 10:00 PM  Result Value Ref Range Status   SARS Coronavirus 2 NEGATIVE  NEGATIVE Final    Comment: (NOTE) If result is NEGATIVE SARS-CoV-2 target nucleic acids are NOT DETECTED. The SARS-CoV-2 RNA is generally detectable in upper and lower  respiratory specimens during the acute phase of infection. The lowest  concentration of SARS-CoV-2 viral copies this assay can detect is 250  copies / mL. A negative result does not preclude SARS-CoV-2 infection  and should not be used as the sole basis for treatment or other  patient management decisions.  A negative result may occur with  improper specimen collection / handling, submission of specimen other  than nasopharyngeal swab, presence of viral mutation(s) within the  areas targeted by this assay, and inadequate number of viral copies  (<250 copies / mL). A negative result must be combined with clinical  observations, patient history, and epidemiological information. If result is POSITIVE SARS-CoV-2 target nucleic acids are DETECTED. The SARS-CoV-2 RNA is generally detectable in upper and lower  respiratory specimens dur ing the acute phase of infection.  Positive  results are indicative of active infection with SARS-CoV-2.  Clinical  correlation with patient history and other diagnostic information is  necessary to determine patient infection status.  Positive results do  not rule out bacterial infection or co-infection with other viruses. If result is PRESUMPTIVE POSTIVE SARS-CoV-2 nucleic acids MAY BE PRESENT.   A presumptive positive result was obtained on the submitted specimen  and confirmed on repeat testing.  While 2019 novel coronavirus  (SARS-CoV-2) nucleic acids may be present in the submitted sample  additional confirmatory testing may be necessary for epidemiological  and / or clinical management purposes  to differentiate between  SARS-CoV-2 and other Sarbecovirus currently known to infect humans.  If clinically indicated additional testing with an alternate test  methodology [phone]) is  advised. The SARS-CoV-2 RNA is generally  detectable in upper and lower respiratory sp ecimens during the acute  phase of infection. The expected result is Negative. Fact Sheet for Patients:  StrictlyIdeas.no Fact Sheet for Healthcare Providers: BankingDealers.co.za This test is not yet approved or cleared by the [place] FDA and has been authorized for detection and/or diagnosis of SARS-CoV-2 by FDA under an Emergency Use Authorization (EUA).  This EUA will remain in effect (meaning this test can be used) for the duration of the COVID-19 declaration under Section 564(b)(1) of the Act, 21 U.S.C. section 360bbb-3(b)(1), unless the authorization is terminated or revoked  sooner. Performed at [facility], [place] [address], [place], [place] [address]      Liver Function Tests: Recent Labs  Lab 01/30/19 2005  AST 40  ALT 33  ALKPHOS 83  BILITOT 1.7*  PROT 8.2*  ALBUMIN 4.7   No results for input(s): LIPASE, AMYLASE in the last 168 hours. No results for input(s): AMMONIA in the last 168 hours.  Cardiac Enzymes: Recent Labs  Lab 01/30/19 2005  TROPONINI <0.03   BNP (last 3 results) Recent Labs    01/30/19 2314  BNP 13.1    ProBNP (last 3 results) No results for input(s): PROBNP in the last 8760 hours.    Studies: Dg Chest Portable 1 View  Result Date: 01/30/2019 CLINICAL DATA:  Initial evaluation for acute shortness of breath, cough. EXAM: PORTABLE CHEST 1 VIEW COMPARISON:  Prior radiograph from 05/24/2018 FINDINGS: Right-sided Port-A-Cath in place. Stable cardiomegaly. Mediastinal silhouette normal. Lungs normally inflated. No focal infiltrates. No edema or effusion. Minimal left basilar subsegmental atelectasis. No pneumothorax. No acute osseous finding. IMPRESSION: 1. Minimal left basilar subsegmental atelectasis. No other active cardiopulmonary disease. 2. Stable cardiomegaly without pulmonary  edema. Electronically Signed   By: Jeannine Boga M.D.   On: 01/30/2019 19:51    Scheduled Meds: . amitriptyline  25 mg Oral BID  . amLODipine  10 mg Oral Daily  . budesonide (PULMICORT) nebulizer solution  0.25 mg Nebulization BID  . clonazePAM  0.5 mg Oral TID  . enoxaparin (LOVENOX) injection  40 mg Subcutaneous Daily  . furosemide  20 mg Intravenous Q12H  . furosemide  20 mg Intravenous Once  . ipratropium  0.5 mg Nebulization TID  . [START ON 02/02/2019] methylPREDNISolone (SOLU-MEDROL) injection  40 mg Intravenous Q12H  . multivitamin with minerals  1 tablet Oral Daily  . QUEtiapine  100 mg Oral QHS  . rosuvastatin  20 mg Oral QHS    Admission status: Observation: Based on patients clinical presentation and evaluation of above clinical data, I have made determination that patient meets Inpatient criteria at this time.  Time spent: 20 min  [facility] Pager [phone]. If 7PM-7AM, please contact night-coverage at [email or web address], Office  [phone]  password [place]  02/01/2019, 12:13 PM  LOS: 0 days

## 2019-02-01 NOTE — Progress Notes (Signed)
Pt had 28 beats of SVT on tele. Pt states she felt a sharp sudden pain, mid sternum, that went away within seconds. Tele now shows NSR, HR 90's. Dr. Darrick Meigs paged and made aware.

## 2019-02-01 NOTE — Consult Note (Signed)
NAME:  Talin Rozeboom, MRN:  [id number], DOB:  Jul 01, 1966, LOS: 0 ADMISSION DATE:  01/30/2019, CONSULTATION DATE:  5/14 REFERRING MD:  Darrick Meigs, CHIEF COMPLAINT:  hypoxia  Brief History   53 year old obese female with a history of tobacco abuse admitted on 5/12 with chief complaint of shortness of breath and associated hypoxia.  Echo consistent with diastolic dysfunction, remained hypoxic in spite of diuresis pulmonary asked to evaluate on 5/14.  History of present illness   This is a 53 year old female w/ hx as outlined below admitted on 5/12 from home with chief complaint of shortness of breath.  Had called her primary care provider and spoke via teleconference endorsing progressive shortness of breath, chest tightness, and fatigue this progressed over 7 days prior to admission..  Reported at her baseline health she could ambulate approximately 3 miles she did this soon before her symptoms started., now get short of breath walking 3 flights of stairs.  On presentation she denied fever or cough.  She denied orthopnea or waking up acutely short of breath.  COVID was negative, chest x-ray with vascular congestion.  Her resting pulse oximetry was 85% on presentation.  She was admitted for further evaluation of hypoxia.  Hospital course: Admitted 5/12: D-dimer negative chest x-ray vascular congestion.  Started on bronchodilators 5/13: Awaiting echocardiogram, started on low-dose Lasix at 20 mg every 12 hours with a working diagnosis of presumptive diastolic dysfunction +/-COPD.  Although no wheezing appreciated was started on systemic steroids 5/14: Net fluid balance -2.1 L,Echo results show normal systolic function and left ventricle diastolic parameters consistent with impaired relaxation, room air saturations 92% at rest, dropping to 79% on room air with activity requiring 3 L of oxygen to normalize, because of this pulmonary asked to evaluate Past Medical History  Tobacco abuse, obesity,Depression,  anxiety, history of pseudoseizures.  Significant Hospital Events   Admitted 5/12: D-dimer negative chest x-ray vascular congestion.  Started on bronchodilators 5/13: Awaiting echocardiogram, started on low-dose Lasix at 20 mg every 12 hours with a working diagnosis of presumptive diastolic dysfunction +/-COPD.  Although no wheezing appreciated was started on systemic steroids 5/14: Net fluid balance -2.1 L,Echo results show normal systolic function and left ventricle diastolic parameters consistent with impaired relaxation, room air saturations 92% at rest, dropping to 79% on room air with activity requiring 3 L of oxygen to normalize, because of this pulmonary asked to evaluate Consults:  Pulmonary  Procedures:  None  Significant Diagnostic Tests:  Echocardiogram 5/13: EF 60 to 65%.  Increased left ventricular wall thickness.  Diastolic Doppler parameters consistent with impaired relaxation.  Normal RV function.  Normal RV size.Normal IVC.  Micro Data:  COVID negative. RVP not performed.  Antimicrobials:  None.   Interim history/subjective:  Only marginal improvement since admission. Still short of breath off O2  Objective   Blood pressure 124/86, pulse 97, temperature 98.1 F (36.7 C), temperature source Oral, resp. rate 20, height 5' 2.25" (1.581 m), weight 80.6 kg, SpO2 97 %.        Intake/Output Summary (Last 24 hours) at 02/01/2019 1558 Last data filed at 02/01/2019 1400 Gross per 24 hour  Intake 700 ml  Output 4200 ml  Net -3500 ml   Filed Weights   01/30/19 1836 01/31/19 0200  Weight: 74.8 kg 80.6 kg    Examination: General: Calm, in no distress. Mild dyspnea with normal conversation. Ruddy plethoric complexion. HENT: No scleral injection. Hoarse voice. Normal thyroid Lungs: No signs of hyperinflation. Normal chest  excursion with normal diaphragmatic movement. Vesicular breath sounds throughout with no adventitial sounds. No clubbing Cardiovascular: JVP not  elevated. Normal capillary refill. No edema. S1, S2 normal with no gallops or murmurs. Abdomen: Soft with active bowel sounds. Extremities:  No swollen or painful joints. Neuro: No focal deficits. GU: No CVA tenderness.  Assessment & Plan:  Acute hypoxic respiratory failure. The etiology of which is unclear. Given the acuity of onset, a viral infectious trigger is plausible, however the degree and persistence of hypoxia suggests underlying lung disease. Echocardiogram shows grade 1 diastolic function with borderline increased filling pressures. She has been diuresed to a point of contraction alkalosis without substantial improvement which suggests that heart failure is not a major driver of hypoxia. She doesn't give a story suggestive of untreated sleep apnea so a sleep study can be deferred to the outpatient setting. Most likely COPD has been unmasked. Outpatient PFT's and Pulmonary follow-up will be arranged.  PLAN: Given the acuity of symptoms and equivocal CXR and persistent symptoms, will obtain a CT chest.  Step steroids to oral. Further recommendations pending CT No further diuresis at this time.    Labs   CBC: Recent Labs  Lab 01/30/19 2005 01/31/19 0141 01/31/19 0655  WBC 12.4* 10.6* 8.3  NEUTROABS 7.4  --   --   HGB 15.9* 16.1* 13.7  HCT 46.2* 48.0* 40.5  MCV 95.3 94.3 97.4  PLT 377 288 034    Basic Metabolic Panel: Recent Labs  Lab 01/30/19 2005 01/30/19 2226 01/31/19 0141 01/31/19 0655 01/31/19 1426 02/01/19 0401  NA 137  --   --  136  --  137  K 4.0  --   --  2.8*  --  3.3*  CL 96*  --   --  98  --  97*  CO2 22  --   --  25  --  31  GLUCOSE 93  --   --  132*  --  152*  BUN 12  --   --  9  --  13  CREATININE 1.00  --  0.94 0.91  --  0.72  CALCIUM 10.3  --   --  8.5*  --  8.5*  MG  --  2.5*  --   --  2.2  --   PHOS  --  4.9*  --   --   --   --    GFR: Estimated Creatinine Clearance: 81.4 mL/min (by C-G formula based on SCr of 0.72 mg/dL). Recent  Labs  Lab 01/30/19 2005 01/30/19 2215 01/31/19 0141 01/31/19 0655  PROCALCITON  --   --  <0.10  --   WBC 12.4*  --  10.6* 8.3  LATICACIDVEN  --  0.9  --   --     Liver Function Tests: Recent Labs  Lab 01/30/19 2005  AST 40  ALT 33  ALKPHOS 83  BILITOT 1.7*  PROT 8.2*  ALBUMIN 4.7   No results for input(s): LIPASE, AMYLASE in the last 168 hours. No results for input(s): AMMONIA in the last 168 hours.  ABG    Component Value Date/Time   PHART 7.510 (H) 01/31/2019 1605   PCO2ART 31.9 (L) 01/31/2019 1605   PO2ART 109 (H) 01/31/2019 1605   HCO3 25.3 01/31/2019 1605   TCO2 30 01/22/2015 0343   ACIDBASEDEF 1.0 01/19/2015 2356   O2SAT 98.2 01/31/2019 1605     Coagulation Profile: No results for input(s): INR, PROTIME in the last 168 hours.  Cardiac Enzymes:  Recent Labs  Lab 01/30/19 2005  TROPONINI <0.03    HbA1C: Hgb A1c MFr Bld  Date/Time Value Ref Range Status  10/11/2014 07:48 AM 5.4 <5.7 % Final    Comment:    (NOTE)                                                                       According to the ADA Clinical Practice Recommendations for 2011, when HbA1c is used as a screening test:  >=6.5%   Diagnostic of Diabetes Mellitus           (if abnormal result is confirmed) 5.7-6.4%   Increased risk of developing Diabetes Mellitus References:Diagnosis and Classification of Diabetes Mellitus,Diabetes Care,2011,34(Suppl 1):S62-S69 and Standards of Medical Care in         Diabetes - 2011,Diabetes EYCX,4481,85 (Suppl 1):S11-S61.   10/01/2014 05:21 AM 5.5 <5.7 % Final    Comment:    (NOTE)                                                                       According to the ADA Clinical Practice Recommendations for 2011, when HbA1c is used as a screening test:  >=6.5%   Diagnostic of Diabetes Mellitus           (if abnormal result is confirmed) 5.7-6.4%   Increased risk of developing Diabetes Mellitus References:Diagnosis and Classification of Diabetes  [place],Diabetes UDJS,9702,63(ZCHYI 1):S62-S69 and Standards of Medical Care in         Diabetes - 2011,Diabetes FOYD,7412,87 (Suppl 1):S11-S61.     CBG: No results for input(s): GLUCAP in the last 168 hours.  Review of Systems:   Review of Systems  Constitutional: Negative.   HENT: Negative.   Eyes: Negative.   Respiratory: Positive for cough and shortness of breath. Negative for sputum production.   Cardiovascular: Negative for leg swelling. Chest pain: chest tightness.  Gastrointestinal: Negative.   Genitourinary: Negative.   Musculoskeletal: Negative.   Skin: Negative.   Neurological: Negative.   Endo/Heme/Allergies: Negative.   Psychiatric/Behavioral: Positive for depression (well controlled at present.).   Past Medical History  She,  has a past medical history of Anorexia, Anxiety, Depression, DJD (degenerative joint disease) of cervical spine, Endometriosis, ETOH abuse, Migraines, PICC (peripherally inserted central catheter) in place, Psychiatric pseudoseizure, and Seizures ([place]).   Surgical History    Past Surgical History:  Procedure Laterality Date  . ABDOMINAL HYSTERECTOMY    . ABDOMINAL SURGERY    . APPENDECTOMY    . CARPAL TUNNEL RELEASE     2010  . CHOLECYSTECTOMY    . ESOPHAGOGASTRODUODENOSCOPY N/A 10/06/2014   Procedure: ESOPHAGOGASTRODUODENOSCOPY (EGD);  Surgeon: Inda Castle, MD;  Location: [facility];  Service: Endoscopy;  Laterality: N/A;  . KNEE SURGERY    . laproscopy    . NASAL SINUS SURGERY       Social History   reports that she has been smoking cigarettes. She has a 3.00 pack-year smoking history. She has never used  smokeless tobacco. She reports previous alcohol use. She reports previous drug use.   Family History   Her family history includes Cancer in an other family member; Heart attack in her father; Heart failure in her father; Hyperlipidemia in her mother; Hypertension in her mother.   Allergies Allergies  Allergen Reactions   . Aspirin Anaphylaxis  . Doxycycline Anaphylaxis and Other (See Comments)    Joint damage also  . Imitrex [Sumatriptan Base] Other (See Comments)    Severe hypertension  . Iodinated Diagnostic Agents Anaphylaxis  . Lidocaine Anaphylaxis    Pt states she does well with Marcaine/Bupivicaine without problem  . Prednisone Other (See Comments)    She goes crazy  . Sulfa Antibiotics Anaphylaxis  . Sulfasalazine Anaphylaxis  . Sumatriptan Other (See Comments)    Felt like she was "having a stroke", heart rate and blood pressure "went through the roof"  . Tramadol Other (See Comments)    seizures  . Levofloxacin Other (See Comments)    Joints hurt  . Amoxicillin-Pot Clavulanate Other (See Comments)    Pt can't remember what type of reaction she had, but states her pharmacy has it listed in their allergy list.  . Caffeine-Sodium Benzoate     Other reaction(s): Dizziness (intolerance)  . Prochlorperazine Other (See Comments)    Tonic/clonic seizure  . Latex Rash  . Levofloxacin Rash and Other (See Comments)    Joints hurt  . Metrizamide Other (See Comments)  . Nsaids Rash and Other (See Comments)    GI bleed  . Tolmetin Rash    Home Medications  Prior to Admission medications   Medication Sig Start Date End Date Taking? Authorizing Provider  acetaminophen (TYLENOL) 500 MG tablet Take 500-1,000 mg by mouth every 6 (six) hours as needed for moderate pain.   Yes [provider]  amitriptyline (ELAVIL) 25 MG tablet Take 25 mg by mouth 2 (two) times daily.   Yes [provider]  amLODipine (NORVASC) 10 MG tablet Take 10 mg by mouth daily.   Yes [provider]  clonazePAM (KLONOPIN) 0.5 MG tablet Take 0.5 mg by mouth 3 (three) times daily.   Yes [provider]  diphenoxylate-atropine (LOMOTIL) 2.5-0.025 MG tablet Take 1 tablet by mouth 4 (four) times daily as needed for diarrhea or loose stools.   Yes [provider]  fluticasone (FLONASE) 50  MCG/ACT nasal spray Place 1 spray into both nostrils daily as needed for allergies or rhinitis.   Yes [provider]  hydrochlorothiazide (MICROZIDE) 12.5 MG capsule Take 12.5 mg by mouth daily.   Yes [provider]  methocarbamol (ROBAXIN) 500 MG tablet Take 1,000 mg by mouth 3 (three) times daily as needed for muscle spasms.   Yes [provider]  mirtazapine (REMERON) 15 MG tablet Take 15 mg by mouth at bedtime.   Yes [provider]  Multiple Vitamin (MULTIVITAMIN WITH MINERALS) TABS tablet Take 1 tablet by mouth daily.   Yes [provider]  QUEtiapine (SEROQUEL) 100 MG tablet Take 100 mg by mouth at bedtime.   Yes [provider]  rosuvastatin (CRESTOR) 20 MG tablet Take 20 mg by mouth at bedtime.   Yes [provider]  FLUoxetine (PROZAC) 20 MG capsule Take 1 capsule (20 mg total) by mouth daily. Patient not taking: Reported on 05/24/2018 02/06/15   Geradine Girt, DO    Kipp Brood, MD [facility] ICU Physician [place]  Pager: [phone] Mobile: [phone] After hours: [phone].  02/02/2019, 9:01 AM

## 2019-02-01 NOTE — Progress Notes (Signed)
SATURATION QUALIFICATIONS: (This note is used to comply with regulatory documentation for home oxygen)  Patient Saturations on Room Air at Rest = 92%  Patient Saturations on Room Air while Ambulating = 88% (dropped to 79 in middle of walk)  Patient Saturations on 3 Liters of oxygen while Ambulating = 93%  Please briefly explain why patient needs home oxygen:  Meets requirements for oxygen.

## 2019-02-02 ENCOUNTER — Inpatient Hospital Stay (HOSPITAL_COMMUNITY): Payer: Medicare Other

## 2019-02-02 DIAGNOSIS — J9601 Acute respiratory failure with hypoxia: Secondary | ICD-10-CM

## 2019-02-02 DIAGNOSIS — R0600 Dyspnea, unspecified: Secondary | ICD-10-CM

## 2019-02-02 LAB — BASIC METABOLIC PANEL
Anion gap: 9 (ref 5–15)
BUN: 17 mg/dL (ref 6–20)
CO2: 33 mmol/L — ABNORMAL HIGH (ref 22–32)
Calcium: 8.3 mg/dL — ABNORMAL LOW (ref 8.9–10.3)
Chloride: 96 mmol/L — ABNORMAL LOW (ref 98–111)
Creatinine, Ser: 0.59 mg/dL (ref 0.44–1.00)
GFR calc Af Amer: >60 mL/min (ref >60)
GFR calc non Af Amer: >60 mL/min (ref >60)
Glucose, Bld: 116 mg/dL — ABNORMAL HIGH (ref 70–99)
Potassium: 3.4 mmol/L — ABNORMAL LOW (ref 3.5–5.1)
Sodium: 138 mmol/L (ref 135–145)

## 2019-02-02 MED ORDER — TECHNETIUM TO 99M ALBUMIN AGGREGATED
1.6000 | Freq: Once | INTRAVENOUS | Status: AC | PRN
  Administered 2019-02-02: 1.6 via INTRAVENOUS

## 2019-02-02 MED ORDER — POTASSIUM CHLORIDE CRYS ER 20 MEQ PO TBCR
40.0000 meq | EXTENDED_RELEASE_TABLET | Freq: Once | ORAL | Status: AC
  Administered 2019-02-02: 40 meq via ORAL
  Filled 2019-02-02: qty 2

## 2019-02-02 MED ORDER — DIPHENHYDRAMINE HCL 50 MG/ML IJ SOLN: 50.0000 mg | Freq: Once | INTRAMUSCULAR | Status: DC

## 2019-02-02 MED ORDER — PREDNISONE 20 MG PO TABS
40.0000 mg | ORAL_TABLET | Freq: Every day | ORAL | Status: DC
  Filled 2019-02-02: qty 2

## 2019-02-02 MED ORDER — LOPERAMIDE HCL 2 MG PO CAPS
2.0000 mg | ORAL_CAPSULE | Freq: Every day | ORAL | Status: DC | PRN
  Administered 2019-02-02: 2 mg via ORAL
  Filled 2019-02-02: qty 1

## 2019-02-02 NOTE — Progress Notes (Signed)
[facility]  PROGRESS NOTE  [name] YQM:[id number] DOB: [birth year] DOA: 01/30/2019 PCP: [name], MD   Brief HPI:   53 year old female with history of migraine, alcohol abuse, endometriosis, depression, anxiety, pseudoseizure was seen by her PCP for shortness of breath.  Chest x-ray was negative.  COVID-19 test came back negative.D-dimer less than 0.27.  Patient became hypoxic with O2 sats dropped to 80s.  Subjective   Patient seen and examined, continues to complain of shortness of breath.  Still requiring oxygen.  Appreciate pulmonology consultation.   Assessment/Plan:     1. Dyspnea-unclear etiology, echocardiogram shows impaired relaxation of left ventricle, d-dimer less than 0.27.  On exam patient does have bibasilar crackles.patient was started on Lasix 20 mg IV every 12 hours and is -2.6 L since past 24 hours.    Likely has acute diastolic CHF, exacerbated after patient received IV fluid bolus in the ED.  Patient also is a smoker , has never been diagnosed with COPD.  Started on Solu-Medrol.  ABG shows chronic respiratory and metabolic alkalosis.  Discontinue albuterol nebulizer due to tachycardia and tachypnea, patient has history of anxiety, continue with ipratropium nebulizer every 6 hours.COVID-19 test negative.  Pulmonology has seen the patient and ordered VQ scan and high-resolution chest CT.  2. Migraines-continue Depakote, started on as needed Vicodin.  3. History of depression/anxiety-continue amitriptyline, lorazepam  4. Hypokalemia-patient's serum potassium is 3.4 this morning.  We will replace K. Dur 40 mEq p.o. x1.  Follow BMP in a.m.      CBG: No results for input(s): GLUCAP in the last 168 hours.  CBC: Recent Labs  Lab 01/30/19 2005 01/31/19 0141 01/31/19 0655  WBC 12.4* 10.6* 8.3  NEUTROABS 7.4  --   --   HGB 15.9* 16.1* 13.7  HCT 46.2* 48.0* 40.5  MCV 95.3 94.3 97.4  PLT 377 288 528    Basic Metabolic Panel: Recent Labs  Lab  01/30/19 2005 01/30/19 2226 01/31/19 0141 01/31/19 0655 01/31/19 1426 02/01/19 0401 02/01/19 1954 02/02/19 0351  NA 137  --   --  136  --  137  --  138  K 4.0  --   --  2.8*  --  3.3* 3.1* 3.4*  CL 96*  --   --  98  --  97*  --  96*  CO2 22  --   --  25  --  31  --  33*  GLUCOSE 93  --   --  132*  --  152*  --  116*  BUN 12  --   --  9  --  13  --  17  CREATININE 1.00  --  0.94 0.91  --  0.72  --  0.59  CALCIUM 10.3  --   --  8.5*  --  8.5*  --  8.3*  MG  --  2.5*  --   --  2.2  --   --   --   PHOS  --  4.9*  --   --   --   --   --   --      DVT prophylaxis: Lovenox  Code Status: Full code  Family Communication: No family at bedside  Disposition Plan: likely home when medically ready for discharge     Consultants:    Procedures:     Antibiotics:   Anti-infectives (From admission, onward)   None       Objective   Vitals:   02/02/19 0430 02/02/19  1610 02/02/19 1027 02/02/19 1322  BP:  106/68  131/77  Pulse:  79  82  Resp:  20  14  Temp:  97.6 F (36.4 C)  98.4 F (36.9 C)  TempSrc:  Oral  Oral  SpO2: (!) 87% 95% 95% 92%  Weight:  82 kg    Height:        Intake/Output Summary (Last 24 hours) at 02/02/2019 1449 Last data filed at 02/02/2019 1300 Gross per 24 hour  Intake 1260 ml  Output 2500 ml  Net -1240 ml   Filed Weights   01/30/19 1836 01/31/19 0200 02/02/19 0437  Weight: 74.8 kg 80.6 kg 82 kg     Physical Examination:   General: Appears in no acute distress  Cardiovascular: S1-S2, regular, no murmur auscultated  Respiratory: Faint crackles auscultated at lung bases bilaterally  Abdomen: Abdomen is soft, nontender, no organomegaly  Extremities: No edema in the lower extremities  Neurologic: Alert, oriented x3, no focal deficit noted.          Data Reviewed: I have personally reviewed following labs and imaging studies   Recent Results (from the past 240 hour(s))  SARS Coronavirus 2 (CEPHEID- Performed in Deputy  hospital lab), Hosp Order     Status: None   Collection Time: 01/30/19 10:00 PM  Result Value Ref Range Status   SARS Coronavirus 2 NEGATIVE NEGATIVE Final    Comment: (NOTE) If result is NEGATIVE SARS-CoV-2 target nucleic acids are NOT DETECTED. The SARS-CoV-2 RNA is generally detectable in upper and lower  respiratory specimens during the acute phase of infection. The lowest  concentration of SARS-CoV-2 viral copies this assay can detect is 250  copies / mL. A negative result does not preclude SARS-CoV-2 infection  and should not be used as the sole basis for treatment or other  patient management decisions.  A negative result may occur with  improper specimen collection / handling, submission of specimen other  than nasopharyngeal swab, presence of viral mutation(s) within the  areas targeted by this assay, and inadequate number of viral copies  (<250 copies / mL). A negative result must be combined with clinical  observations, patient history, and epidemiological information. If result is POSITIVE SARS-CoV-2 target nucleic acids are DETECTED. The SARS-CoV-2 RNA is generally detectable in upper and lower  respiratory specimens dur ing the acute phase of infection.  Positive  results are indicative of active infection with SARS-CoV-2.  Clinical  correlation with patient history and other diagnostic information is  necessary to determine patient infection status.  Positive results do  not rule out bacterial infection or co-infection with other viruses. If result is PRESUMPTIVE POSTIVE SARS-CoV-2 nucleic acids MAY BE PRESENT.   A presumptive positive result was obtained on the submitted specimen  and confirmed on repeat testing.  While 2019 novel coronavirus  (SARS-CoV-2) nucleic acids may be present in the submitted sample  additional confirmatory testing may be necessary for epidemiological  and / or clinical management purposes  to differentiate between  SARS-CoV-2 and other  Sarbecovirus currently known to infect humans.  If clinically indicated additional testing with an alternate test  methodology [phone]) is advised. The SARS-CoV-2 RNA is generally  detectable in upper and lower respiratory sp ecimens during the acute  phase of infection. The expected result is Negative. Fact Sheet for Patients:  StrictlyIdeas.no Fact Sheet for Healthcare Providers: BankingDealers.co.za This test is not yet approved or cleared by the [place] FDA and has been authorized for detection  and/or diagnosis of SARS-CoV-2 by FDA under an Emergency Use Authorization (EUA).  This EUA will remain in effect (meaning this test can be used) for the duration of the COVID-19 declaration under Section 564(b)(1) of the Act, 21 U.S.C. section 360bbb-3(b)(1), unless the authorization is terminated or revoked sooner. Performed at [facility], [place] [address] [place], [place] [address]      Liver Function Tests: Recent Labs  Lab 01/30/19 2005  AST 40  ALT 33  ALKPHOS 83  BILITOT 1.7*  PROT 8.2*  ALBUMIN 4.7   No results for input(s): LIPASE, AMYLASE in the last 168 hours. No results for input(s): AMMONIA in the last 168 hours.  Cardiac Enzymes: Recent Labs  Lab 01/30/19 2005  TROPONINI <0.03   BNP (last 3 results) Recent Labs    01/30/19 2314  BNP 13.1    ProBNP (last 3 results) No results for input(s): PROBNP in the last 8760 hours.    Studies: Ct Chest High Resolution  Result Date: 02/02/2019 CLINICAL DATA:  Chronic dyspnea. Mid chest pain radiating to the right scapula for 1 week. Cough. Low-grade fever. EXAM: CT CHEST WITHOUT CONTRAST TECHNIQUE: Multidetector CT imaging of the chest was performed following the standard protocol without intravenous contrast. High resolution imaging of the lungs, as well as inspiratory and expiratory imaging, was performed. COMPARISON:  01/30/2019 chest  radiograph. FINDINGS: Cardiovascular: Top-normal heart size. Small to moderate pericardial effusion. Right internal jugular Port-A-Cath terminates the cavoatrial junction. Great vessels are normal in course and caliber. Mediastinum/Nodes: No discrete thyroid nodules. Unremarkable esophagus. No pathologically enlarged axillary, mediastinal or hilar lymph nodes, noting limited sensitivity for the detection of hilar adenopathy on this noncontrast study. Lungs/Pleura: No pneumothorax. No pleural effusion. No significant lobular air trapping on the expiration sequence. There is prominent patchy ground-glass opacity in both lungs, predominantly in the upper lobes. Minimal patchy subpleural reticulation throughout both lungs. Scattered small subpleural parenchymal bands at both lung bases. No significant regions of traction bronchiectasis, architectural distortion or frank honeycombing. No lung masses or significant pulmonary nodules. Upper abdomen: Cholecystectomy. Musculoskeletal: No aggressive appearing focal osseous lesions. Mild thoracic spondylosis. IMPRESSION: 1. Prominent patchy ground-glass opacity in the lungs, predominantly in the upper lungs. Differential is broad and includes mild pulmonary edema, atypical infection including viral pneumonia, alveolar hemorrhage or drug reaction. Patient had a negative SARS Coronavirus 2 test on 01/30/2019. 2. Small scattered subpleural parenchymal bands at the lung bases, compatible with mild nonspecific postinfectious/postinflammatory scarring. 3. Small to moderate pericardial effusion. Electronically Signed   By: Ilona Sorrel M.D.   On: 02/02/2019 11:45    Scheduled Meds: . amitriptyline  25 mg Oral BID  . amLODipine  10 mg Oral Daily  . budesonide (PULMICORT) nebulizer solution  0.25 mg Nebulization BID  . clonazePAM  0.5 mg Oral TID  . diphenhydrAMINE  50 mg Intravenous Once  . enoxaparin (LOVENOX) injection  40 mg Subcutaneous Daily  . furosemide  20 mg  Intravenous Q12H  . ipratropium  0.5 mg Nebulization TID  . multivitamin with minerals  1 tablet Oral Daily  . [START ON 02/03/2019] predniSONE  40 mg Oral Q breakfast  . QUEtiapine  100 mg Oral QHS  . rosuvastatin  20 mg Oral QHS    Admission status: Observation: Based on patients clinical presentation and evaluation of above clinical data, I have made determination that patient meets Inpatient criteria at this time.  Time spent: 20 min  [facility] Pager  [phone]. If 7PM-7AM, please contact night-coverage at [email or web address], Office  [phone]  password [place]  02/02/2019, 2:49 PM  LOS: 1 day

## 2019-02-03 DIAGNOSIS — J849 Interstitial pulmonary disease, unspecified: Secondary | ICD-10-CM

## 2019-02-03 DIAGNOSIS — R0602 Shortness of breath: Secondary | ICD-10-CM

## 2019-02-03 LAB — HEPATIC FUNCTION PANEL
ALT: 34 U/L (ref 0–44)
AST: 18 U/L (ref 15–41)
Albumin: 3.9 g/dL (ref 3.5–5.0)
Alkaline Phosphatase: 80 U/L (ref 38–126)
Bilirubin, Direct: 0.1 mg/dL (ref 0.0–0.2)
Indirect Bilirubin: 0.2 mg/dL — ABNORMAL LOW (ref 0.3–0.9)
Total Bilirubin: 0.3 mg/dL (ref 0.3–1.2)
Total Protein: 7.1 g/dL (ref 6.5–8.1)

## 2019-02-03 LAB — BASIC METABOLIC PANEL
Anion gap: 9 (ref 5–15)
BUN: 22 mg/dL — ABNORMAL HIGH (ref 6–20)
CO2: 35 mmol/L — ABNORMAL HIGH (ref 22–32)
Calcium: 8.8 mg/dL — ABNORMAL LOW (ref 8.9–10.3)
Chloride: 94 mmol/L — ABNORMAL LOW (ref 98–111)
Creatinine, Ser: 0.95 mg/dL (ref 0.44–1.00)
GFR calc Af Amer: >60 mL/min (ref >60)
GFR calc non Af Amer: >60 mL/min (ref >60)
Glucose, Bld: 131 mg/dL — ABNORMAL HIGH (ref 70–99)
Potassium: 3.2 mmol/L — ABNORMAL LOW (ref 3.5–5.1)
Sodium: 138 mmol/L (ref 135–145)

## 2019-02-03 LAB — SEDIMENTATION RATE: Sed Rate: 10 mm/hr (ref 0–22)

## 2019-02-03 LAB — CK: Total CK: 23 U/L — ABNORMAL LOW (ref 38–234)

## 2019-02-03 MED ORDER — IPRATROPIUM-ALBUTEROL 0.5-2.5 (3) MG/3ML IN SOLN
3.0000 mL | Freq: Two times a day (BID) | RESPIRATORY_TRACT | Status: DC
  Administered 2019-02-03 – 2019-02-04 (×2): 3 mL via RESPIRATORY_TRACT
  Filled 2019-02-03 (×2): qty 3

## 2019-02-03 NOTE — Consult Note (Signed)
NAME:  [name], MRN:  [id number], DOB:  1966/02/06, LOS: 2 ADMISSION DATE:  01/30/2019, CONSULTATION DATE:  5/14 REFERRING MD:  Darrick Meigs, CHIEF COMPLAINT:  hypoxia  Brief History   53 year old obese female with a history of tobacco abuse admitted on 5/12 with chief complaint of shortness of breath and associated hypoxia.  Echo consistent with diastolic dysfunction, remained hypoxic in spite of diuresis pulmonary asked to evaluate on 5/14.  History of present illness   This is a 53 year old female w/ hx as outlined below admitted on 5/12 from home with chief complaint of shortness of breath.  Had called her primary care provider and spoke via teleconference endorsing progressive shortness of breath, chest tightness, and fatigue this progressed over 7 days prior to admission..  Reported at her baseline health she could ambulate approximately 3 miles she did this soon before her symptoms started., now get short of breath walking 3 flights of stairs.  On presentation she denied fever or cough.  She denied orthopnea or waking up acutely short of breath.  COVID was negative, chest x-ray with vascular congestion.  Her resting pulse oximetry was 85% on presentation.  She was admitted for further evaluation of hypoxia.  Hospital course: Admitted 5/12: D-dimer negative chest x-ray vascular congestion.  Started on bronchodilators 5/13: Awaiting echocardiogram, started on low-dose Lasix at 20 mg every 12 hours with a working diagnosis of presumptive diastolic dysfunction +/-COPD.  Although no wheezing appreciated was started on systemic steroids 5/14: Net fluid balance -2.1 L,Echo results show normal systolic function and left ventricle diastolic parameters consistent with impaired relaxation, room air saturations 92% at rest, dropping to 79% on room air with activity requiring 3 L of oxygen to normalize, because of this pulmonary asked to evaluate  Past Medical History  Tobacco abuse, obesity,Depression,  anxiety, history of pseudoseizures.  Significant Hospital Events   Admitted 5/12: D-dimer negative chest x-ray vascular congestion.  Started on bronchodilators 5/13: Awaiting echocardiogram, started on low-dose Lasix at 20 mg every 12 hours with a working diagnosis of presumptive diastolic dysfunction +/-COPD.  Although no wheezing appreciated was started on systemic steroids 5/14: Net fluid balance -2.1 L,Echo results show normal systolic function and left ventricle diastolic parameters consistent with impaired relaxation, room air saturations 92% at rest, dropping to 79% on room air with activity requiring 3 L of oxygen to normalize, because of this pulmonary asked to evaluate Consults:  Pulmonary  Procedures:  None  Significant Diagnostic Tests:  Echocardiogram 5/13: EF 60 to 65%.  Increased left ventricular wall thickness.  Diastolic Doppler parameters consistent with impaired relaxation.  Normal RV function.  Normal RV size.Normal IVC. 5/15 VQ scan negative 5/15 HRCT chest groundglass opacity especially in the upper lungs, mild nonspecific scarring at lung bases   Micro Data:  COVID negative. RVP not performed.  Antimicrobials:  None.   Interim history/subjective:   Continues to have dyspnea O2 saturation recorded was 93 to 95% on room air but then dropped to 90% 4 L negative since admission   Objective   Blood pressure 130/88, pulse 69, temperature 97.9 F (36.6 C), temperature source Oral, resp. rate 19, height 5' 2.25" (1.581 m), weight 81.4 kg, SpO2 95 %.        Intake/Output Summary (Last 24 hours) at 02/03/2019 0943 Last data filed at 02/03/2019 0607 Gross per 24 hour  Intake 600 ml  Output 1000 ml  Net -400 ml   Filed Weights   01/31/19 0200 02/02/19 0437 02/03/19  0621  Weight: 80.6 kg 82 kg 81.4 kg    Examination: General: Obese middle-aged woman, no distress, anxious affect HENT: No JVD, no pallor or icterus Lungs: Decreased breath sounds bilateral,  fine crackles right base which cleared on coughing Cardiovascular:  S1, S2 normal with no gallops or murmurs. Abdomen: Soft with active bowel sounds. Extremities:  No swollen or painful joints.  No lymphedema Neuro: No focal deficits. GU: No CVA tenderness.  Assessment & Plan:  Acute hypoxic respiratory failure in this smoker with bilateral upper lobe groundglass infiltrates of acute onset 7 to 10 days Given the acuity of onset, a viral infectious trigger is plausible, however the degree and persistence of hypoxia suggests underlying lung disease. Although she has diuresed 4 L, hypoxia has not fully resolved  -Differential includes RB ILD versus collagen vascular disease associated ILD.  This does not seem to be IPF -CO VID has been ruled out and fever has been absent since admission  PLAN: Obtain screening serology including ANA, CCP Outpatient PFT's and Pulmonary follow-up will be arranged. Assess for oxygen needs on discharge and encourage mobilization  Kara Mead MD. FCCP. [facility] Pager [phone] If no response call [phone]      02/03/2019, 9:43 AM

## 2019-02-03 NOTE — Progress Notes (Signed)
[facility]  PROGRESS NOTE  [name] PTW:[id number] DOB: [birth year] DOA: 01/30/2019 PCP: [name], MD   Brief HPI:   53 year old female with history of migraine, alcohol abuse, endometriosis, depression, anxiety, pseudoseizure was seen by her PCP for shortness of breath.  Chest x-ray was negative.  COVID-19 test came back negative.D-dimer less than 0.27.  Patient became hypoxic with O2 sats dropped to 80s.  Subjective   Patient seen and examined, continues to have shortness of breath.  Though she says that she is feeling better than yesterday.  Appreciate pulmonary consultation.  VQ scan is negative for pulmonary embolism.  CT chest high-resolution showed bilateral interstitial changes.   Assessment/Plan:     1. Dyspnea-unclear etiology, echocardiogram shows impaired relaxation of left ventricle, d-dimer less than 0.27.  On exam patient does have bibasilar crackles.patient was started on Lasix 20 mg IV every 12 hours and is -2.6 L since past 24 hours.    Likely has acute diastolic CHF, exacerbated after patient received IV fluid bolus in the ED.  Patient also is a smoker , has never been diagnosed with COPD.  Started on Solu-Medrol.  ABG shows chronic respiratory and metabolic alkalosis.  Discontinue albuterol nebulizer due to tachycardia and tachypnea, patient has history of anxiety, continue with ipratropium nebulizer every 6 hours.COVID-19 test negative.  Pulmonology has seen the patient and ordered VQ scan and high-resolution chest CT. VQ scan is negative.  High-resolution chest CT shows nonspecific findings.  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  2. Migraines-continue Depakote, started on as needed Vicodin.  3. History of depression/anxiety-continue amitriptyline, lorazepam  4. Hypokalemia- replete.       CBG: No results for input(s): GLUCAP in the last 168 hours.  CBC: Recent Labs   Lab 01/30/19 2005 01/31/19 0141 01/31/19 0655  WBC 12.4* 10.6* 8.3  NEUTROABS 7.4  --   --   HGB 15.9* 16.1* 13.7  HCT 46.2* 48.0* 40.5  MCV 95.3 94.3 97.4  PLT 377 288 700    Basic Metabolic Panel: Recent Labs  Lab 01/30/19 2005 01/30/19 2226 01/31/19 0141 01/31/19 0655 01/31/19 1426 02/01/19 0401 02/01/19 1954 02/02/19 0351  NA 137  --   --  136  --  137  --  138  K 4.0  --   --  2.8*  --  3.3* 3.1* 3.4*  CL 96*  --   --  98  --  97*  --  96*  CO2 22  --   --  25  --  31  --  33*  GLUCOSE 93  --   --  132*  --  152*  --  116*  BUN 12  --   --  9  --  13  --  17  CREATININE 1.00  --  0.94 0.91  --  0.72  --  0.59  CALCIUM 10.3  --   --  8.5*  --  8.5*  --  8.3*  MG  --  2.5*  --   --  2.2  --   --   --   PHOS  --  4.9*  --   --   --   --   --   --      DVT prophylaxis: Lovenox  Code Status: Full code  Family Communication: No family at bedside  Disposition Plan: likely home when medically ready for discharge     Consultants:  Procedures:     Antibiotics:   Anti-infectives (From admission, onward)   None       Objective   Vitals:   02/03/19 0621 02/03/19 0831 02/03/19 0939 02/03/19 1224  BP: (!) 135/92  130/88 133/88  Pulse: 69   78  Resp: 19     Temp: 97.9 F (36.6 C)   97.7 F (36.5 C)  TempSrc: Oral   Oral  SpO2: 93% 95%  99%  Weight: 81.4 kg     Height:        Intake/Output Summary (Last 24 hours) at 02/03/2019 1518 Last data filed at 02/03/2019 0930 Gross per 24 hour  Intake 840 ml  Output 1000 ml  Net -160 ml   Filed Weights   01/31/19 0200 02/02/19 0437 02/03/19 6440  Weight: 80.6 kg 82 kg 81.4 kg     Physical Examination: General-appears in no acute distress Heart-S1-S2, regular, no murmur auscultated Lungs-clear to auscultation bilaterally, no wheezing or crackles auscultated Abdomen-soft, nontender, no organomegaly Extremities-no edema in the lower extremities Neuro-alert, oriented x3, no focal deficit  noted      Data Reviewed: I have personally reviewed following labs and imaging studies   Recent Results (from the past 240 hour(s))  SARS Coronavirus 2 (CEPHEID- Performed in [place] hospital lab), Hosp Order     Status: None   Collection Time: 01/30/19 10:00 PM  Result Value Ref Range Status   SARS Coronavirus 2 NEGATIVE NEGATIVE Final    Comment: (NOTE) If result is NEGATIVE SARS-CoV-2 target nucleic acids are NOT DETECTED. The SARS-CoV-2 RNA is generally detectable in upper and lower  respiratory specimens during the acute phase of infection. The lowest  concentration of SARS-CoV-2 viral copies this assay can detect is 250  copies / mL. A negative result does not preclude SARS-CoV-2 infection  and should not be used as the sole basis for treatment or other  patient management decisions.  A negative result may occur with  improper specimen collection / handling, submission of specimen other  than nasopharyngeal swab, presence of viral mutation(s) within the  areas targeted by this assay, and inadequate number of viral copies  (<250 copies / mL). A negative result must be combined with clinical  observations, patient history, and epidemiological information. If result is POSITIVE SARS-CoV-2 target nucleic acids are DETECTED. The SARS-CoV-2 RNA is generally detectable in upper and lower  respiratory specimens dur ing the acute phase of infection.  Positive  results are indicative of active infection with SARS-CoV-2.  Clinical  correlation with patient history and other diagnostic information is  necessary to determine patient infection status.  Positive results do  not rule out bacterial infection or co-infection with other viruses. If result is PRESUMPTIVE POSTIVE SARS-CoV-2 nucleic acids MAY BE PRESENT.   A presumptive positive result was obtained on the submitted specimen  and confirmed on repeat testing.  While 2019 novel coronavirus  (SARS-CoV-2) nucleic acids may  be present in the submitted sample  additional confirmatory testing may be necessary for epidemiological  and / or clinical management purposes  to differentiate between  SARS-CoV-2 and other Sarbecovirus currently known to infect humans.  If clinically indicated additional testing with an alternate test  methodology [phone]) is advised. The SARS-CoV-2 RNA is generally  detectable in upper and lower respiratory sp ecimens during the acute  phase of infection. The expected result is Negative. Fact Sheet for Patients:  StrictlyIdeas.no Fact Sheet for Healthcare Providers: BankingDealers.co.za This test is not yet approved  or cleared by the [place] and has been authorized for detection and/or diagnosis of SARS-CoV-2 by FDA under an Emergency Use Authorization (EUA).  This EUA will remain in effect (meaning this test can be used) for the duration of the COVID-19 declaration under Section 564(b)(1) of the Act, 21 U.S.C. section 360bbb-3(b)(1), unless the authorization is terminated or revoked sooner. Performed at [facility], [place] [address], [place], [place] [address]      Liver Function Tests: Recent Labs  Lab 01/30/19 2005 02/03/19 1047  AST 40 18  ALT 33 34  ALKPHOS 83 80  BILITOT 1.7* 0.3  PROT 8.2* 7.1  ALBUMIN 4.7 3.9   No results for input(s): LIPASE, AMYLASE in the last 168 hours. No results for input(s): AMMONIA in the last 168 hours.  Cardiac Enzymes: Recent Labs  Lab 01/30/19 2005 02/03/19 1047  CKTOTAL  --  23*  TROPONINI <0.03  --    BNP (last 3 results) Recent Labs    01/30/19 2314  BNP 13.1    ProBNP (last 3 results) No results for input(s): PROBNP in the last 8760 hours.    Studies: Dg Chest 2 View  Result Date: 02/02/2019 CLINICAL DATA:  Chest pain and shortness of breath EXAM: CHEST - 2 VIEW COMPARISON:  Chest radiograph Jan 30, 2019 and chest CT Feb 02, 2019  FINDINGS: Port-A-Cath tip is in the superior vena cava near the cavoatrial junction. No pneumothorax. The areas of hazy ground-glass opacity seen on chest CT in the upper lobes are not well seen by radiography. There is subtle increased opacity in portions of the right upper lobe. Lungs elsewhere clear. Heart appears mildly enlarged, stable, with pulmonary vascularity normal. No adenopathy. No bone lesions. IMPRESSION: Areas of airspace consolidation seen in the upper lobes on CT earlier in the day are much less well seen radiography. Subtle increased opacity in a portion of the right upper lobe is appreciable, less well seen than on CT. Lungs elsewhere appear clear by radiography. Stable cardiac prominence. Stable Port-A-Cath position. No pneumothorax. Electronically Signed   By: Lowella Grip III M.D.   On: 02/02/2019 15:39   Nm Pulmonary Perfusion  Result Date: 02/02/2019 CLINICAL DATA:  Short of breath. Progressive worsening short of breath. EXAM: NUCLEAR MEDICINE PERFUSION LUNG SCAN TECHNIQUE: Perfusion images were obtained in multiple projections after intravenous injection of radiopharmaceutical. Ventilation scans intentionally deferred if perfusion scan and chest x-ray adequate for interpretation during COVID 19 epidemic. RADIOPHARMACEUTICALS:  1.6 mCi Tc-3m MAA IV COMPARISON:  Chest CT 02/02/2019 FINDINGS: No wedge-shaped peripheral perfusion defect within the lungs to suggest acute pulmonary embolism. IMPRESSION: No evidence acute pulmonary embolism. Electronically Signed   By: Suzy Bouchard M.D.   On: 02/02/2019 15:13   Ct Chest High Resolution  Result Date: 02/02/2019 CLINICAL DATA:  Chronic dyspnea. Mid chest pain radiating to the right scapula for 1 week. Cough. Low-grade fever. EXAM: CT CHEST WITHOUT CONTRAST TECHNIQUE: Multidetector CT imaging of the chest was performed following the standard protocol without intravenous contrast. High resolution imaging of the lungs, as well as  inspiratory and expiratory imaging, was performed. COMPARISON:  01/30/2019 chest radiograph. FINDINGS: Cardiovascular: Top-normal heart size. Small to moderate pericardial effusion. Right internal jugular Port-A-Cath terminates the cavoatrial junction. Great vessels are normal in course and caliber. Mediastinum/Nodes: No discrete thyroid nodules. Unremarkable esophagus. No pathologically enlarged axillary, mediastinal or hilar lymph nodes, noting limited sensitivity for the detection of hilar adenopathy on this noncontrast study. Lungs/Pleura: No pneumothorax.  No pleural effusion. No significant lobular air trapping on the expiration sequence. There is prominent patchy ground-glass opacity in both lungs, predominantly in the upper lobes. Minimal patchy subpleural reticulation throughout both lungs. Scattered small subpleural parenchymal bands at both lung bases. No significant regions of traction bronchiectasis, architectural distortion or frank honeycombing. No lung masses or significant pulmonary nodules. Upper abdomen: Cholecystectomy. Musculoskeletal: No aggressive appearing focal osseous lesions. Mild thoracic spondylosis. IMPRESSION: 1. Prominent patchy ground-glass opacity in the lungs, predominantly in the upper lungs. Differential is broad and includes mild pulmonary edema, atypical infection including viral pneumonia, alveolar hemorrhage or drug reaction. Patient had a negative SARS Coronavirus 2 test on 01/30/2019. 2. Small scattered subpleural parenchymal bands at the lung bases, compatible with mild nonspecific postinfectious/postinflammatory scarring. 3. Small to moderate pericardial effusion. Electronically Signed   By: Ilona Sorrel M.D.   On: 02/02/2019 11:45    Scheduled Meds: . amitriptyline  25 mg Oral BID  . amLODipine  10 mg Oral Daily  . budesonide (PULMICORT) nebulizer solution  0.25 mg Nebulization BID  . clonazePAM  0.5 mg Oral TID  . diphenhydrAMINE  50 mg Intravenous Once  .  enoxaparin (LOVENOX) injection  40 mg Subcutaneous Daily  . furosemide  20 mg Intravenous Q12H  . ipratropium-albuterol  3 mL Nebulization BID  . multivitamin with minerals  1 tablet Oral Daily  . predniSONE  40 mg Oral Q breakfast  . QUEtiapine  100 mg Oral QHS  . rosuvastatin  20 mg Oral QHS    Admission status: Observation: Based on patients clinical presentation and evaluation of above clinical data, I have made determination that patient meets Inpatient criteria at this time.  Time spent: 20 min  [facility] Pager [phone]. If 7PM-7AM, please contact night-coverage at [email or web address], Office  [phone]  password [email or web address]  02/03/2019, 3:18 PM  LOS: 2 days

## 2019-02-04 LAB — BASIC METABOLIC PANEL
Anion gap: 10 (ref 5–15)
BUN: 22 mg/dL — ABNORMAL HIGH (ref 6–20)
CO2: 31 mmol/L (ref 22–32)
Calcium: 8.1 mg/dL — ABNORMAL LOW (ref 8.9–10.3)
Chloride: 96 mmol/L — ABNORMAL LOW (ref 98–111)
Creatinine, Ser: 0.67 mg/dL (ref 0.44–1.00)
GFR calc Af Amer: >60 mL/min (ref >60)
GFR calc non Af Amer: >60 mL/min (ref >60)
Glucose, Bld: 130 mg/dL — ABNORMAL HIGH (ref 70–99)
Potassium: 2.8 mmol/L — ABNORMAL LOW (ref 3.5–5.1)
Sodium: 137 mmol/L (ref 135–145)

## 2019-02-04 LAB — POTASSIUM: Potassium: 4.3 mmol/L (ref 3.5–5.1)

## 2019-02-04 MED ORDER — POTASSIUM CHLORIDE 10 MEQ/100ML IV SOLN
10.0000 meq | INTRAVENOUS | Status: AC
  Administered 2019-02-04 (×3): 10 meq via INTRAVENOUS
  Filled 2019-02-04 (×3): qty 100

## 2019-02-04 MED ORDER — POTASSIUM CHLORIDE 10 MEQ/100ML IV SOLN: 10.0000 meq | INTRAVENOUS | Status: DC

## 2019-02-04 MED ORDER — POTASSIUM CHLORIDE CRYS ER 20 MEQ PO TBCR
40.0000 meq | EXTENDED_RELEASE_TABLET | Freq: Once | ORAL | Status: AC
  Administered 2019-02-04: 40 meq via ORAL
  Filled 2019-02-04: qty 2

## 2019-02-04 NOTE — Progress Notes (Signed)
[facility]  PROGRESS NOTE  [name] JKK:[id number] DOB: 1966/02/15 DOA: 01/30/2019 PCP: [name], MD   Brief HPI:   53 year old female with history of migraine, alcohol abuse, endometriosis, depression, anxiety, pseudoseizure was seen by her PCP for shortness of breath.  Chest x-ray was negative.  COVID-19 test came back negative.D-dimer less than 0.27.  Patient became hypoxic with O2 sats dropped to 80s.  Subjective   Patient seen and examined, denies shortness of breath.  Has diuresed well with IV Lasix.  Feels generally weak.  O2 sats dropped to 84 on ambulation.  Improved with 2 L of oxygen via nasal cannula.   Assessment/Plan:     1. Dyspnea-unclear etiology, echocardiogram shows impaired relaxation of left ventricle, d-dimer less than 0.27.  On exam patient does have bibasilar crackles.patient was started on Lasix 20 mg IV every 12 hours and is -2.6 L since past 24 hours.    Likely has acute diastolic CHF, exacerbated after patient received IV fluid bolus in the ED.  Patient also is a smoker , has never been diagnosed with COPD.  Started on Solu-Medrol.  ABG shows chronic respiratory and metabolic alkalosis.  Discontinue albuterol nebulizer due to tachycardia and tachypnea, patient has history of anxiety, continue with ipratropium nebulizer every 6 hours.COVID-19 test negative.  Pulmonology has seen the patient and ordered VQ scan and high-resolution chest CT. VQ scan is negative.  High-resolution chest CT shows nonspecific findings.  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  Patient can be discharged home on 2 L of oxygen via nasal cannula.  2. Hypokalemia-potassium is 2.8, will check potassium today.  3. Migraines-continue Depakote, started on as needed Vicodin.  4. History of depression/anxiety-continue amitriptyline, lorazepam  5. Hypokalemia- replete.       CBG: No results for  input(s): GLUCAP in the last 168 hours.  CBC: Recent Labs  Lab 01/30/19 2005 01/31/19 0141 01/31/19 0655  WBC 12.4* 10.6* 8.3  NEUTROABS 7.4  --   --   HGB 15.9* 16.1* 13.7  HCT 46.2* 48.0* 40.5  MCV 95.3 94.3 97.4  PLT 377 288 716    Basic Metabolic Panel: Recent Labs  Lab 01/30/19 2226  01/31/19 0655 01/31/19 1426 02/01/19 0401 02/01/19 1954 02/02/19 0351 02/03/19 1809 02/04/19 0348  NA  --   --  136  --  137  --  138 138 137  K  --   --  2.8*  --  3.3* 3.1* 3.4* 3.2* 2.8*  CL  --   --  98  --  97*  --  96* 94* 96*  CO2  --   --  25  --  31  --  33* 35* 31  GLUCOSE  --   --  132*  --  152*  --  116* 131* 130*  BUN  --   --  9  --  13  --  17 22* 22*  CREATININE  --    < > 0.91  --  0.72  --  0.59 0.95 0.67  CALCIUM  --   --  8.5*  --  8.5*  --  8.3* 8.8* 8.1*  MG 2.5*  --   --  2.2  --   --   --   --   --   PHOS 4.9*  --   --   --   --   --   --   --   --    < > =  values in this interval not displayed.     DVT prophylaxis: Lovenox  Code Status: Full code  Family Communication: No family at bedside  Disposition Plan: likely home when medically ready for discharge     Consultants:    Procedures:     Antibiotics:   Anti-infectives (From admission, onward)   None       Objective   Vitals:   02/03/19 2111 02/04/19 0513 02/04/19 0844 02/04/19 1308  BP: 120/89 112/71  134/82  Pulse: 84 88  99  Resp: 20 20  16   Temp: 98 F (36.7 C) 98.4 F (36.9 C)  98.8 F (37.1 C)  TempSrc: Oral Oral  Oral  SpO2: 92% 93% 93% 94%  Weight:  82.8 kg    Height:        Intake/Output Summary (Last 24 hours) at 02/04/2019 1448 Last data filed at 02/04/2019 1311 Gross per 24 hour  Intake -  Output 1050 ml  Net -1050 ml   Filed Weights   02/02/19 0437 02/03/19 0621 02/04/19 0513  Weight: 82 kg 81.4 kg 82.8 kg     Physical Examination:  General-appears in no acute distress Heart-S1-S2, regular, no murmur auscultated Lungs-clear to auscultation  bilaterally, no wheezing or crackles auscultated Abdomen-soft, nontender, no organomegaly Extremities-no edema in the lower extremities Neuro-alert, oriented x3, no focal deficit noted     Data Reviewed: I have personally reviewed following labs and imaging studies   Recent Results (from the past 240 hour(s))  SARS Coronavirus 2 (CEPHEID- Performed in [place] hospital lab), Hosp Order     Status: None   Collection Time: 01/30/19 10:00 PM  Result Value Ref Range Status   SARS Coronavirus 2 NEGATIVE NEGATIVE Final    Comment: (NOTE) If result is NEGATIVE SARS-CoV-2 target nucleic acids are NOT DETECTED. The SARS-CoV-2 RNA is generally detectable in upper and lower  respiratory specimens during the acute phase of infection. The lowest  concentration of SARS-CoV-2 viral copies this assay can detect is 250  copies / mL. A negative result does not preclude SARS-CoV-2 infection  and should not be used as the sole basis for treatment or other  patient management decisions.  A negative result may occur with  improper specimen collection / handling, submission of specimen other  than nasopharyngeal swab, presence of viral mutation(s) within the  areas targeted by this assay, and inadequate number of viral copies  (<250 copies / mL). A negative result must be combined with clinical  observations, patient history, and epidemiological information. If result is POSITIVE SARS-CoV-2 target nucleic acids are DETECTED. The SARS-CoV-2 RNA is generally detectable in upper and lower  respiratory specimens dur ing the acute phase of infection.  Positive  results are indicative of active infection with SARS-CoV-2.  Clinical  correlation with patient history and other diagnostic information is  necessary to determine patient infection status.  Positive results do  not rule out bacterial infection or co-infection with other viruses. If result is PRESUMPTIVE POSTIVE SARS-CoV-2 nucleic acids MAY BE  PRESENT.   A presumptive positive result was obtained on the submitted specimen  and confirmed on repeat testing.  While 2019 novel coronavirus  (SARS-CoV-2) nucleic acids may be present in the submitted sample  additional confirmatory testing may be necessary for epidemiological  and / or clinical management purposes  to differentiate between  SARS-CoV-2 and other Sarbecovirus currently known to infect humans.  If clinically indicated additional testing with an alternate test  methodology [phone]) is advised.  The SARS-CoV-2 RNA is generally  detectable in upper and lower respiratory sp ecimens during the acute  phase of infection. The expected result is Negative. Fact Sheet for Patients:  StrictlyIdeas.no Fact Sheet for Healthcare Providers: BankingDealers.co.za This test is not yet approved or cleared by the [place] FDA and has been authorized for detection and/or diagnosis of SARS-CoV-2 by FDA under an Emergency Use Authorization (EUA).  This EUA will remain in effect (meaning this test can be used) for the duration of the COVID-19 declaration under Section 564(b)(1) of the Act, 21 U.S.C. section 360bbb-3(b)(1), unless the authorization is terminated or revoked sooner. Performed at [facility], [place] [address] [place], [place] [address]      Liver Function Tests: Recent Labs  Lab 01/30/19 2005 02/03/19 1047  AST 40 18  ALT 33 34  ALKPHOS 83 80  BILITOT 1.7* 0.3  PROT 8.2* 7.1  ALBUMIN 4.7 3.9   No results for input(s): LIPASE, AMYLASE in the last 168 hours. No results for input(s): AMMONIA in the last 168 hours.  Cardiac Enzymes: Recent Labs  Lab 01/30/19 2005 02/03/19 1047  CKTOTAL  --  23*  TROPONINI <0.03  --    BNP (last 3 results) Recent Labs    01/30/19 2314  BNP 13.1    ProBNP (last 3 results) No results for input(s): PROBNP in the last 8760 hours.    Studies: Nm  Pulmonary Perfusion  Result Date: 02/02/2019 CLINICAL DATA:  Short of breath. Progressive worsening short of breath. EXAM: NUCLEAR MEDICINE PERFUSION LUNG SCAN TECHNIQUE: Perfusion images were obtained in multiple projections after intravenous injection of radiopharmaceutical. Ventilation scans intentionally deferred if perfusion scan and chest x-ray adequate for interpretation during COVID 19 epidemic. RADIOPHARMACEUTICALS:  1.6 mCi Tc-66m MAA IV COMPARISON:  Chest CT 02/02/2019 FINDINGS: No wedge-shaped peripheral perfusion defect within the lungs to suggest acute pulmonary embolism. IMPRESSION: No evidence acute pulmonary embolism. Electronically Signed   By: Suzy Bouchard M.D.   On: 02/02/2019 15:13    Scheduled Meds: . amitriptyline  25 mg Oral BID  . amLODipine  10 mg Oral Daily  . budesonide (PULMICORT) nebulizer solution  0.25 mg Nebulization BID  . clonazePAM  0.5 mg Oral TID  . diphenhydrAMINE  50 mg Intravenous Once  . enoxaparin (LOVENOX) injection  40 mg Subcutaneous Daily  . multivitamin with minerals  1 tablet Oral Daily  . QUEtiapine  100 mg Oral QHS  . rosuvastatin  20 mg Oral QHS    Admission status: Observation: Based on patients clinical presentation and evaluation of above clinical data, I have made determination that patient meets Inpatient criteria at this time.  Time spent: 20 min  [facility] Pager [phone]. If 7PM-7AM, please contact night-coverage at [email or web address], Office  [phone]  password [place]  02/04/2019, 2:48 PM  LOS: 3 days

## 2019-02-04 NOTE — Progress Notes (Signed)
SATURATION QUALIFICATIONS: (This note is used to comply with regulatory documentation for home oxygen)  Patient Saturations on Room Air at Rest = 94%  Patient Saturations on Room Air while Ambulating = 84%  Patient Saturations on 2 Liters of oxygen while Ambulating = 95%  Please briefly explain why patient needs home oxygen: Pt's HR increased to 120s with ambulation, O2 Sats dropped to 84% RA. Pt c/o severe HA and became nauseated. O2 Sats improved with 2L/[place]. MD present to assess.

## 2019-02-05 LAB — BASIC METABOLIC PANEL
Anion gap: 8 (ref 5–15)
BUN: 17 mg/dL (ref 6–20)
CO2: 31 mmol/L (ref 22–32)
Calcium: 8.4 mg/dL — ABNORMAL LOW (ref 8.9–10.3)
Chloride: 98 mmol/L (ref 98–111)
Creatinine, Ser: 0.78 mg/dL (ref 0.44–1.00)
GFR calc Af Amer: >60 mL/min (ref >60)
GFR calc non Af Amer: >60 mL/min (ref >60)
Glucose, Bld: 133 mg/dL — ABNORMAL HIGH (ref 70–99)
Potassium: 4.1 mmol/L (ref 3.5–5.1)
Sodium: 137 mmol/L (ref 135–145)

## 2019-02-05 MED ORDER — HEPARIN SOD (PORK) LOCK FLUSH 100 UNIT/ML IV SOLN
500.0000 [IU] | INTRAVENOUS | Status: AC | PRN
  Administered 2019-02-05: 500 [IU]

## 2019-02-05 MED ORDER — FUROSEMIDE 20 MG PO TABS: 20.0000 mg | ORAL_TABLET | Freq: Every day | ORAL | 11 refills | Status: DC

## 2019-02-05 MED ORDER — BUDESONIDE 0.25 MG/2ML IN SUSP: 0.2500 mg | Freq: Two times a day (BID) | RESPIRATORY_TRACT | 2 refills | Status: DC

## 2019-02-05 NOTE — Evaluation (Signed)
Physical Therapy Evaluation Patient Details Name: [name] MRN: [id number] DOB: [birth year] Today's Date: 02/05/2019   History of Present Illness  53 yo female admitted with dyspnea. Hx of migraines, depression, chronic pain, Sz/pseudoseizures, ETOH abuse, obesity  Clinical Impression  On eval, pt was close Min guard assist for mobility. She was very unsteady. She c/o a headache and dizziness throughout session. Vitals during session: Hr 99 bpm, O2 95% on RA, BP 138/96. Discussed d/c plan-pt stated she plans to return home. She adamantly refuses SNF placement. Pt stated she lives on the 3rd floor. She was unable to safely attempt any stairs on today-she barely walked ~60 feet. Since pt is refusing placement, will recommend HHPT follow up and 24 hour supervision/assist. Will continue to follow and progress activity as tolerated.     Follow Up Recommendations Home health PT;Supervision/Assistance - 24 hour (pt refuses SNF placement)    Equipment Recommendations  Rolling walker with 5" wheels    Recommendations for Other Services       Precautions / Restrictions Precautions Precautions: Fall Restrictions Weight Bearing Restrictions: No      Mobility  Bed Mobility Overal bed mobility: Modified Independent                Transfers Overall transfer level: Needs assistance   Transfers: Sit to/from Stand Sit to Stand: Supervision         General transfer comment: for safety. Pt c/o dizziness  Ambulation/Gait Ambulation/Gait assistance: Min guard Gait Distance (Feet): 60 Feet Assistive device: (pt pushed and relied on dynamap) Gait Pattern/deviations: Staggering left;Staggering right;Drifts right/left;Step-through pattern;Trunk flexed     General Gait Details: Pt c/o headache and dizziness. Unsteady throughout ambulation distance. O2 sat 95% on RA-did not note any dyspnea.   Stairs            Wheelchair Mobility    Modified Rankin (Stroke Patients Only)        Balance Overall balance assessment: Needs assistance         Standing balance support: Bilateral upper extremity supported Standing balance-Leahy Scale: Poor                               Pertinent Vitals/Pain Pain Assessment: No/denies pain    Home Living Family/patient expects to be discharged to:: Private residence     Type of Home: Apartment Home Access: Stairs to enter Entrance Stairs-Rails: Right Entrance Stairs-Number of Steps: 2 flights Home Layout: One level Home Equipment: Crutches      Prior Function Level of Independence: Independent         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 [employer]        Extremity/Trunk Assessment   Upper Extremity Assessment Upper Extremity Assessment: Overall WFL for tasks assessed    Lower Extremity Assessment Lower Extremity Assessment: Generalized weakness    Cervical / Trunk Assessment Cervical / Trunk Assessment: Normal  Communication   Communication: No difficulties  Cognition Arousal/Alertness: Awake/alert Behavior During Therapy: WFL for tasks assessed/performed Overall Cognitive Status: Within Functional Limits for tasks assessed                                        General Comments      Exercises     Assessment/Plan    PT Assessment Patient needs continued  PT services  PT Problem List Decreased balance;Decreased activity tolerance;Decreased mobility       PT Treatment Interventions DME instruction;Gait training;Therapeutic activities;Functional mobility training;Balance training;Patient/family education;Therapeutic exercise    PT Goals (Current goals can be found in the Care Plan section)  Acute Rehab PT Goals Patient Stated Goal: to feel better PT Goal Formulation: With patient Time For Goal Achievement: 02/19/19 Potential to Achieve Goals: Good    Frequency Min 3X/week   Barriers to discharge        Co-evaluation                AM-PAC PT "6 Clicks" Mobility  Outcome Measure Help needed turning from your back to your side while in a flat bed without using bedrails?: None Help needed moving from lying on your back to sitting on the side of a flat bed without using bedrails?: None Help needed moving to and from a bed to a chair (including a wheelchair)?: A Little Help needed standing up from a chair using your arms (e.g., wheelchair or bedside chair)?: A Little Help needed to walk in hospital room?: A Little Help needed climbing 3-5 steps with a railing? : A Little 6 Click Score: 20    End of Session Equipment Utilized During Treatment: Gait belt Activity Tolerance: (limited by dizziness) Patient left: in bed;with call bell/phone within reach;with bed alarm set   PT Visit Diagnosis: Unsteadiness on feet (R26.81);Difficulty in walking, not elsewhere classified (R26.2)    Time: 5427-0623 PT Time Calculation (min) (ACUTE ONLY): 21 min   Charges:   PT Evaluation $PT Eval Moderate Complexity: [place], PT Acute Rehabilitation Services Pager: [phone] Office: [phone]

## 2019-02-05 NOTE — TOC Transition Note (Signed)
Transition of Care [facility]) - CM/SW Discharge Note   Patient Details  Name: [name] MRN: [id number] Date of Birth: Mar 08, 1966  [facility] CM/SW Contact:  Dessa Phi, RN Phone Number: 02/05/2019, 11:59 AM   Clinical Narrative: Patient ordered for [place], & dme-Patient chose [facility] for HHC-rep Santiago Glad, & dme-[employer] rep Thedore Mins aware of neb machine,rw to deliver to rm prior d/c. PAtient concerned about her oxygen level-attending ordered for pulse oximetry @ home on 02/07/19. Patient in agreement to d/c plan. No further CM needs.            Patient Goals and CMS Choice        Discharge Placement                       Discharge Plan and Services                                     Social Determinants of Health (SDOH) Interventions     Readmission Risk Interventions No flowsheet data found.

## 2019-02-05 NOTE — Progress Notes (Signed)
Patient discharged with all personal belongings including cell phone, purse/wallet, glasses & sunglasses, and knives/scissors picked up from security. Walker and nebulizer machine sent with patient, AVS reviewed with patient by this RN and all questions answered.

## 2019-02-05 NOTE — Discharge Summary (Addendum)
Physician Discharge Summary  [name] JEH:[id number] DOB: June 29, 1966 DOA: 01/30/2019  PCP: Hailey Mccreedy, MD  Admit date: 01/30/2019 Discharge date: 02/05/2019  Time spent: 45* minutes  Recommendations for Outpatient Follow-up:  1. Follow-up pulmonology in 4 weeks.  Patient has appointment to see Dr. Melvyn [name] on 03/05/2019 2. Follow-up PCP in 2 weeks, she will need a repeat BMP in 2 weeks.  Patient discharged on Lasix 20 mg daily. 3. Patient was discharged home health physical therapy, rolling walker.  Patient is refusing to go to skilled nursing facility as recommended by PT.  Discharge Diagnoses:  Active Problems:   SOB (shortness of breath)   Discharge Condition: Stable  Diet recommendation: Heart healthy diet  Filed Weights   02/03/19 0621 02/04/19 0513 02/05/19 0546  Weight: 81.4 kg 82.8 kg 81.7 kg    History of present illness:  53 year old female with history of migraine, alcohol abuse, endometriosis, depression, anxiety, pseudoseizure was seen by her PCP for shortness of breath.  Chest x-ray was negative.  COVID-19 test came back negative.D-dimer less than 0.27.  Patient became hypoxic with O2 sats dropped to 80s.  Hospital Course:   1. Dyspnea- Likely underlying COPD versus ILD, also element of diastolic heart failure.  Patient presented with dyspnea with unclear etiology.  D-dimer was less than 0.27.  ABG showed respiratory alkalosis.  COVID 9 test was negative.  Echocardiogram showed impaired left ventricle relaxation so she was started on IV Lasix with good diuretic response.  Patient was hypoxic during hospitalization and intermittently required oxygen.  After diuresis with IV Lasix.  Patient is currently 95% on room air after ambulation with physical therapy.During hospitalization pulmonology also was consulted, they ordered a VQ scan and high-resolution chest CT. Autoimmune work-up has also been ordered, pulmonology to follow as outpatient.  Chest CT showed nonspecific  findings and pulmonology will follow-up as outpatient in 4 weeks.  We will continue with Pulmicort nebulization twice a day.   2. Hypokalemia-replete  3. Acute diastolic CHF-patient had a good diuretic response with IV Lasix.  Echocardiogram showed impaired left ventricular relaxation consistent with diastolic heart failure.  At this time we will discharge her on Lasix 20 mg p.o. daily.  She will need repeat BMP in 2 weeks at her PCP office.  4. Hypertension-patient takes amlodipine and HCTZ at home.  Will discontinue HCTZ as well as starting her on Lasix.  Continue with Lasix and amlodipine.  3. Migraines-continue Depakote, started on as needed Vicodin.   4. History of depression/anxiety-continue amitriptyline, lorazepam  Patient will be discharged with home health physical therapy, she has refused to go to skilled nursing facility for rehab.  Will also get rolling walker and nebulizer machine.  Patient wants to to have pulse oximetry at home.  Will order DME pulse oximetry x1 on 02/07/2019 on room air.  Patient has been on room air in the hospital at the time of discharge.  As per documentation of RN and physical therapy.  Patient's O2 sats remained more than 95% on room air on ambulation.   Procedures:  VQ scan  Consultations:  Pulmonology  Discharge Exam: Vitals:   02/05/19 0546 02/05/19 0938  BP: 123/81 (!) 116/92  Pulse: 88 88  Resp: 18   Temp: 98.1 F (36.7 C)   SpO2: 95% 93%    General-appears in no acute distress Heart-S1-S2, regular, no murmur auscultated Lungs-clear to auscultation bilaterally, no wheezing or crackles auscultated Abdomen-soft, nontender, no organomegaly Extremities-no edema in the lower extremities Neuro-alert, oriented x3,  no focal deficit noted  Discharge Instructions   Discharge Instructions    Diet - low sodium heart healthy   Complete by:  As directed    Increase activity slowly   Complete by:  As directed      Allergies as of  02/05/2019      Reactions   Aspirin Anaphylaxis   Doxycycline Anaphylaxis, Other (See Comments)   Joint damage also   Imitrex [sumatriptan Base] Other (See Comments)   Severe hypertension   Iodinated Diagnostic Agents Anaphylaxis   Lidocaine Anaphylaxis   Pt states she does well with Marcaine/Bupivicaine without problem   Prednisone Other (See Comments)   She goes crazy   Sulfa Antibiotics Anaphylaxis   Sulfasalazine Anaphylaxis   Sumatriptan Other (See Comments)   Felt like she was "having a stroke", heart rate and blood pressure "went through the roof"   Tramadol Other (See Comments)   seizures   Levofloxacin Other (See Comments)   Joints hurt   Amoxicillin-pot Clavulanate Other (See Comments)   Pt can't remember what type of reaction she had, but states her pharmacy has it listed in their allergy list.   Caffeine-sodium Benzoate    Other reaction(s): Dizziness (intolerance)   Prochlorperazine Other (See Comments)   Tonic/clonic seizure   Latex Rash   Levofloxacin Rash, Other (See Comments)   Joints hurt   Metrizamide Other (See Comments)   Nsaids Rash, Other (See Comments)   GI bleed   Tolmetin Rash      Medication List    STOP taking these medications   hydrochlorothiazide 12.5 MG capsule Commonly known as:  MICROZIDE     TAKE these medications   acetaminophen 500 MG tablet Commonly known as:  TYLENOL Take 500-1,000 mg by mouth every 6 (six) hours as needed for moderate pain.   amitriptyline 25 MG tablet Commonly known as:  ELAVIL Take 25 mg by mouth 2 (two) times daily.   amLODipine 10 MG tablet Commonly known as:  NORVASC Take 10 mg by mouth daily.   budesonide 0.25 MG/2ML nebulizer solution Commonly known as:  PULMICORT Take 2 mLs (0.25 mg total) by nebulization 2 (two) times daily.   clonazePAM 0.5 MG tablet Commonly known as:  KLONOPIN Take 0.5 mg by mouth 3 (three) times daily.   diphenoxylate-atropine 2.5-0.025 MG tablet Commonly known as:   LOMOTIL Take 1 tablet by mouth 4 (four) times daily as needed for diarrhea or loose stools.   FLUoxetine 20 MG capsule Commonly known as:  PROZAC Take 1 capsule (20 mg total) by mouth daily.   fluticasone 50 MCG/ACT nasal spray Commonly known as:  FLONASE Place 1 spray into both nostrils daily as needed for allergies or rhinitis.   furosemide 20 MG tablet Commonly known as:  Lasix Take 1 tablet (20 mg total) by mouth daily.   methocarbamol 500 MG tablet Commonly known as:  ROBAXIN Take 1,000 mg by mouth 3 (three) times daily as needed for muscle spasms.   mirtazapine 15 MG tablet Commonly known as:  REMERON Take 15 mg by mouth at bedtime.   multivitamin with minerals Tabs tablet Take 1 tablet by mouth daily.   QUEtiapine 100 MG tablet Commonly known as:  SEROQUEL Take 100 mg by mouth at bedtime.   rosuvastatin 20 MG tablet Commonly known as:  CRESTOR Take 20 mg by mouth at bedtime.            Durable Medical Equipment  (From admission, onward)  Start     Ordered   02/05/19 1038  For home use only DME Walker rolling  Once    Question:  Patient needs a walker to treat with the following condition  Answer:  Bronchitis   02/05/19 1037   02/05/19 1037  For home use only DME Nebulizer machine  Once    Question:  Patient needs a nebulizer to treat with the following condition  Answer:  Bronchitis   02/05/19 1036         Allergies  Allergen Reactions  . Aspirin Anaphylaxis  . Doxycycline Anaphylaxis and Other (See Comments)    Joint damage also  . Imitrex [Sumatriptan Base] Other (See Comments)    Severe hypertension  . Iodinated Diagnostic Agents Anaphylaxis  . Lidocaine Anaphylaxis    Pt states she does well with Marcaine/Bupivicaine without problem  . Prednisone Other (See Comments)    She goes crazy  . Sulfa Antibiotics Anaphylaxis  . Sulfasalazine Anaphylaxis  . Sumatriptan Other (See Comments)    Felt like she was "having a stroke", heart  rate and blood pressure "went through the roof"  . Tramadol Other (See Comments)    seizures  . Levofloxacin Other (See Comments)    Joints hurt  . Amoxicillin-Pot Clavulanate Other (See Comments)    Pt can't remember what type of reaction she had, but states her pharmacy has it listed in their allergy list.  . Caffeine-Sodium Benzoate     Other reaction(s): Dizziness (intolerance)  . Prochlorperazine Other (See Comments)    Tonic/clonic seizure  . Latex Rash  . Levofloxacin Rash and Other (See Comments)    Joints hurt  . Metrizamide Other (See Comments)  . Nsaids Rash and Other (See Comments)    GI bleed  . Tolmetin Rash   Follow-up Information    Tanda Rockers, MD Follow up on 03/05/2019.   Specialty:  Pulmonary Disease Why:  at 230pm  Contact information: [address] [place] [address] [phone]            The results of significant diagnostics from this hospitalization (including imaging, microbiology, ancillary and laboratory) are listed below for reference.    Significant Diagnostic Studies: Dg Chest 2 View  Result Date: 02/02/2019 CLINICAL DATA:  Chest pain and shortness of breath EXAM: CHEST - 2 VIEW COMPARISON:  Chest radiograph Jan 30, 2019 and chest CT Feb 02, 2019 FINDINGS: Port-A-Cath tip is in the superior vena cava near the cavoatrial junction. No pneumothorax. The areas of hazy ground-glass opacity seen on chest CT in the upper lobes are not well seen by radiography. There is subtle increased opacity in portions of the right upper lobe. Lungs elsewhere clear. Heart appears mildly enlarged, stable, with pulmonary vascularity normal. No adenopathy. No bone lesions. IMPRESSION: Areas of airspace consolidation seen in the upper lobes on CT earlier in the day are much less well seen radiography. Subtle increased opacity in a portion of the right upper lobe is appreciable, less well seen than on CT. Lungs elsewhere appear clear by radiography.  Stable cardiac prominence. Stable Port-A-Cath position. No pneumothorax. Electronically Signed   By: Lowella Grip III M.D.   On: 02/02/2019 15:39   Nm Pulmonary Perfusion  Result Date: 02/02/2019 CLINICAL DATA:  Short of breath. Progressive worsening short of breath. EXAM: NUCLEAR MEDICINE PERFUSION LUNG SCAN TECHNIQUE: Perfusion images were obtained in multiple projections after intravenous injection of radiopharmaceutical. Ventilation scans intentionally deferred if perfusion scan and chest x-ray adequate  for interpretation during COVID 19 epidemic. RADIOPHARMACEUTICALS:  1.6 mCi Tc-68m MAA IV COMPARISON:  Chest CT 02/02/2019 FINDINGS: No wedge-shaped peripheral perfusion defect within the lungs to suggest acute pulmonary embolism. IMPRESSION: No evidence acute pulmonary embolism. Electronically Signed   By: Suzy Bouchard M.D.   On: 02/02/2019 15:13   Ct Chest High Resolution  Result Date: 02/02/2019 CLINICAL DATA:  Chronic dyspnea. Mid chest pain radiating to the right scapula for 1 week. Cough. Low-grade fever. EXAM: CT CHEST WITHOUT CONTRAST TECHNIQUE: Multidetector CT imaging of the chest was performed following the standard protocol without intravenous contrast. High resolution imaging of the lungs, as well as inspiratory and expiratory imaging, was performed. COMPARISON:  01/30/2019 chest radiograph. FINDINGS: Cardiovascular: Top-normal heart size. Small to moderate pericardial effusion. Right internal jugular Port-A-Cath terminates the cavoatrial junction. Great vessels are normal in course and caliber. Mediastinum/Nodes: No discrete thyroid nodules. Unremarkable esophagus. No pathologically enlarged axillary, mediastinal or hilar lymph nodes, noting limited sensitivity for the detection of hilar adenopathy on this noncontrast study. Lungs/Pleura: No pneumothorax. No pleural effusion. No significant lobular air trapping on the expiration sequence. There is prominent patchy ground-glass  opacity in both lungs, predominantly in the upper lobes. Minimal patchy subpleural reticulation throughout both lungs. Scattered small subpleural parenchymal bands at both lung bases. No significant regions of traction bronchiectasis, architectural distortion or frank honeycombing. No lung masses or significant pulmonary nodules. Upper abdomen: Cholecystectomy. Musculoskeletal: No aggressive appearing focal osseous lesions. Mild thoracic spondylosis. IMPRESSION: 1. Prominent patchy ground-glass opacity in the lungs, predominantly in the upper lungs. Differential is broad and includes mild pulmonary edema, atypical infection including viral pneumonia, alveolar hemorrhage or drug reaction. Patient had a negative SARS Coronavirus 2 test on 01/30/2019. 2. Small scattered subpleural parenchymal bands at the lung bases, compatible with mild nonspecific postinfectious/postinflammatory scarring. 3. Small to moderate pericardial effusion. Electronically Signed   By: Ilona Sorrel M.D.   On: 02/02/2019 11:45   Dg Chest Portable 1 View  Result Date: 01/30/2019 CLINICAL DATA:  Initial evaluation for acute shortness of breath, cough. EXAM: PORTABLE CHEST 1 VIEW COMPARISON:  Prior radiograph from 05/24/2018 FINDINGS: Right-sided Port-A-Cath in place. Stable cardiomegaly. Mediastinal silhouette normal. Lungs normally inflated. No focal infiltrates. No edema or effusion. Minimal left basilar subsegmental atelectasis. No pneumothorax. No acute osseous finding. IMPRESSION: 1. Minimal left basilar subsegmental atelectasis. No other active cardiopulmonary disease. 2. Stable cardiomegaly without pulmonary edema. Electronically Signed   By: Jeannine Boga M.D.   On: 01/30/2019 19:51    Microbiology: Recent Results (from the past 240 hour(s))  SARS Coronavirus 2 (CEPHEID- Performed in [facility] hospital lab), Hosp Order     Status: None   Collection Time: 01/30/19 10:00 PM  Result Value Ref Range Status   SARS  Coronavirus 2 NEGATIVE NEGATIVE Final    Comment: (NOTE) If result is NEGATIVE SARS-CoV-2 target nucleic acids are NOT DETECTED. The SARS-CoV-2 RNA is generally detectable in upper and lower  respiratory specimens during the acute phase of infection. The lowest  concentration of SARS-CoV-2 viral copies this assay can detect is 250  copies / mL. A negative result does not preclude SARS-CoV-2 infection  and should not be used as the sole basis for treatment or other  patient management decisions.  A negative result may occur with  improper specimen collection / handling, submission of specimen other  than nasopharyngeal swab, presence of viral mutation(s) within the  areas targeted by this assay, and inadequate number of viral copies  (<250  copies / mL). A negative result must be combined with clinical  observations, patient history, and epidemiological information. If result is POSITIVE SARS-CoV-2 target nucleic acids are DETECTED. The SARS-CoV-2 RNA is generally detectable in upper and lower  respiratory specimens dur ing the acute phase of infection.  Positive  results are indicative of active infection with SARS-CoV-2.  Clinical  correlation with patient history and other diagnostic information is  necessary to determine patient infection status.  Positive results do  not rule out bacterial infection or co-infection with other viruses. If result is PRESUMPTIVE POSTIVE SARS-CoV-2 nucleic acids MAY BE PRESENT.   A presumptive positive result was obtained on the submitted specimen  and confirmed on repeat testing.  While 2019 novel coronavirus  (SARS-CoV-2) nucleic acids may be present in the submitted sample  additional confirmatory testing may be necessary for epidemiological  and / or clinical management purposes  to differentiate between  SARS-CoV-2 and other Sarbecovirus currently known to infect humans.  If clinically indicated additional testing with an alternate test   methodology [phone]) is advised. The SARS-CoV-2 RNA is generally  detectable in upper and lower respiratory sp ecimens during the acute  phase of infection. The expected result is Negative. Fact Sheet for Patients:  StrictlyIdeas.no Fact Sheet for Healthcare Providers: BankingDealers.co.za This test is not yet approved or cleared by the [place] FDA and has been authorized for detection and/or diagnosis of SARS-CoV-2 by FDA under an Emergency Use Authorization (EUA).  This EUA will remain in effect (meaning this test can be used) for the duration of the COVID-19 declaration under Section 564(b)(1) of the Act, 21 U.S.C. section 360bbb-3(b)(1), unless the authorization is terminated or revoked sooner. Performed at [facility], [place] [address], [place], [place] [address]      Labs: Basic Metabolic Panel: Recent Labs  Lab 01/30/19 2226  01/31/19 1426 02/01/19 0401  02/02/19 0351 02/03/19 1809 02/04/19 0348 02/04/19 1423 02/05/19 0418  NA  --    < >  --  137  --  138 138 137  --  137  K  --    < >  --  3.3*   < > 3.4* 3.2* 2.8* 4.3 4.1  CL  --    < >  --  97*  --  96* 94* 96*  --  98  CO2  --    < >  --  31  --  33* 35* 31  --  31  GLUCOSE  --    < >  --  152*  --  116* 131* 130*  --  133*  BUN  --    < >  --  13  --  17 22* 22*  --  17  CREATININE  --    < >  --  0.72  --  0.59 0.95 0.67  --  0.78  CALCIUM  --    < >  --  8.5*  --  8.3* 8.8* 8.1*  --  8.4*  MG 2.5*  --  2.2  --   --   --   --   --   --   --   PHOS 4.9*  --   --   --   --   --   --   --   --   --    < > = values in this interval not displayed.   Liver Function Tests: Recent Labs  Lab 01/30/19 2005 02/03/19 1047  AST 40 18  ALT 33 34  ALKPHOS 83 80  BILITOT 1.7* 0.3  PROT 8.2* 7.1  ALBUMIN 4.7 3.9   No results for input(s): LIPASE, AMYLASE in the last 168 hours. No results for input(s): AMMONIA in the last 168  hours. CBC: Recent Labs  Lab 01/30/19 2005 01/31/19 0141 01/31/19 0655  WBC 12.4* 10.6* 8.3  NEUTROABS 7.4  --   --   HGB 15.9* 16.1* 13.7  HCT 46.2* 48.0* 40.5  MCV 95.3 94.3 97.4  PLT 377 288 294   Cardiac Enzymes: Recent Labs  Lab 01/30/19 2005 02/03/19 1047  CKTOTAL  --  23*  TROPONINI <0.03  --    BNP: BNP (last 3 results) Recent Labs    01/30/19 2314  BNP 13.1    ProBNP (last 3 results) No results for input(s): PROBNP in the last 8760 hours.  CBG: No results for input(s): GLUCAP in the last 168 hours.     Signed:  Oswald Hillock MD.  [facility] 02/05/2019, 10:38 AM

## 2019-02-05 NOTE — Care Management Important Message (Signed)
Important Message  Patient Details IM Letter given to [facility] Case Manager to present to the Patient Name: Ginni Eichler MRN: [id number] Date of Birth: 1966-02-07   Medicare Important Message Given:  Yes    Kerin Salen 02/05/2019, 10:42 AM

## 2019-02-05 NOTE — Progress Notes (Signed)
Patient ambulated about 20 feet on room air, saturations stayed between 97-100%. Patient has questions regarding lasix at discharge, headache, and oxygen at home. Dr. Darrick Meigs paged and questions answered. Patient states "its fine" and states she doesn't have any more questions.

## 2019-02-06 LAB — ANTIEXTRACTABLE NUCLEAR AG
ENA SM Ab Ser-aCnc: <0.2 AI (ref 0.0–0.9)
Ribonucleic Protein: <0.2 AI (ref 0.0–0.9)

## 2019-02-06 LAB — ANA W/REFLEX IF POSITIVE: Anti Nuclear Antibody (ANA): NEGATIVE

## 2019-02-06 LAB — CYCLIC CITRUL PEPTIDE ANTIBODY, IGG/IGA: CCP Antibodies IgG/IgA: 8 units (ref 0–19)

## 2019-02-07 DIAGNOSIS — I110 Hypertensive heart disease with heart failure: Secondary | ICD-10-CM | POA: Diagnosis not present

## 2019-02-07 DIAGNOSIS — I5031 Acute diastolic (congestive) heart failure: Secondary | ICD-10-CM | POA: Diagnosis not present

## 2019-02-07 DIAGNOSIS — F17210 Nicotine dependence, cigarettes, uncomplicated: Secondary | ICD-10-CM | POA: Diagnosis not present

## 2019-02-08 DIAGNOSIS — Z720 Tobacco use: Secondary | ICD-10-CM | POA: Diagnosis not present

## 2019-02-08 DIAGNOSIS — R7303 Prediabetes: Secondary | ICD-10-CM | POA: Diagnosis not present

## 2019-02-08 DIAGNOSIS — F329 Major depressive disorder, single episode, unspecified: Secondary | ICD-10-CM | POA: Diagnosis not present

## 2019-02-08 DIAGNOSIS — I10 Essential (primary) hypertension: Secondary | ICD-10-CM | POA: Diagnosis not present

## 2019-02-08 DIAGNOSIS — E785 Hyperlipidemia, unspecified: Secondary | ICD-10-CM | POA: Diagnosis not present

## 2019-02-08 DIAGNOSIS — K580 Irritable bowel syndrome with diarrhea: Secondary | ICD-10-CM | POA: Diagnosis not present

## 2019-02-08 DIAGNOSIS — E669 Obesity, unspecified: Secondary | ICD-10-CM | POA: Diagnosis not present

## 2019-02-08 DIAGNOSIS — Z0001 Encounter for general adult medical examination with abnormal findings: Secondary | ICD-10-CM | POA: Diagnosis not present

## 2019-02-08 DIAGNOSIS — J302 Other seasonal allergic rhinitis: Secondary | ICD-10-CM | POA: Diagnosis not present

## 2019-02-08 DIAGNOSIS — F419 Anxiety disorder, unspecified: Secondary | ICD-10-CM | POA: Diagnosis not present

## 2019-02-14 DIAGNOSIS — I110 Hypertensive heart disease with heart failure: Secondary | ICD-10-CM | POA: Diagnosis not present

## 2019-02-14 DIAGNOSIS — I5031 Acute diastolic (congestive) heart failure: Secondary | ICD-10-CM | POA: Diagnosis not present

## 2019-02-14 DIAGNOSIS — F17210 Nicotine dependence, cigarettes, uncomplicated: Secondary | ICD-10-CM | POA: Diagnosis not present

## 2019-02-15 DIAGNOSIS — I110 Hypertensive heart disease with heart failure: Secondary | ICD-10-CM | POA: Diagnosis not present

## 2019-02-15 DIAGNOSIS — F17210 Nicotine dependence, cigarettes, uncomplicated: Secondary | ICD-10-CM | POA: Diagnosis not present

## 2019-02-15 DIAGNOSIS — I5031 Acute diastolic (congestive) heart failure: Secondary | ICD-10-CM | POA: Diagnosis not present

## 2019-02-16 DIAGNOSIS — F329 Major depressive disorder, single episode, unspecified: Secondary | ICD-10-CM | POA: Diagnosis not present

## 2019-02-16 DIAGNOSIS — R0781 Pleurodynia: Secondary | ICD-10-CM | POA: Diagnosis not present

## 2019-02-16 DIAGNOSIS — J302 Other seasonal allergic rhinitis: Secondary | ICD-10-CM | POA: Diagnosis not present

## 2019-02-16 DIAGNOSIS — G4452 New daily persistent headache (NDPH): Secondary | ICD-10-CM | POA: Diagnosis not present

## 2019-02-16 DIAGNOSIS — E785 Hyperlipidemia, unspecified: Secondary | ICD-10-CM | POA: Diagnosis not present

## 2019-02-16 DIAGNOSIS — G8929 Other chronic pain: Secondary | ICD-10-CM | POA: Diagnosis not present

## 2019-02-16 DIAGNOSIS — K580 Irritable bowel syndrome with diarrhea: Secondary | ICD-10-CM | POA: Diagnosis not present

## 2019-02-16 DIAGNOSIS — Z720 Tobacco use: Secondary | ICD-10-CM | POA: Diagnosis not present

## 2019-02-16 DIAGNOSIS — I10 Essential (primary) hypertension: Secondary | ICD-10-CM | POA: Diagnosis not present

## 2019-02-16 DIAGNOSIS — E669 Obesity, unspecified: Secondary | ICD-10-CM | POA: Diagnosis not present

## 2019-02-16 DIAGNOSIS — R7303 Prediabetes: Secondary | ICD-10-CM | POA: Diagnosis not present

## 2019-02-16 DIAGNOSIS — F419 Anxiety disorder, unspecified: Secondary | ICD-10-CM | POA: Diagnosis not present

## 2019-02-26 DIAGNOSIS — F17210 Nicotine dependence, cigarettes, uncomplicated: Secondary | ICD-10-CM | POA: Diagnosis not present

## 2019-02-26 DIAGNOSIS — I5031 Acute diastolic (congestive) heart failure: Secondary | ICD-10-CM | POA: Diagnosis not present

## 2019-02-26 DIAGNOSIS — I110 Hypertensive heart disease with heart failure: Secondary | ICD-10-CM | POA: Diagnosis not present

## 2019-03-02 DIAGNOSIS — E871 Hypo-osmolality and hyponatremia: Secondary | ICD-10-CM | POA: Diagnosis not present

## 2019-03-02 DIAGNOSIS — R112 Nausea with vomiting, unspecified: Secondary | ICD-10-CM | POA: Diagnosis not present

## 2019-03-02 DIAGNOSIS — R1013 Epigastric pain: Secondary | ICD-10-CM | POA: Diagnosis not present

## 2019-03-02 DIAGNOSIS — Z20828 Contact with and (suspected) exposure to other viral communicable diseases: Secondary | ICD-10-CM | POA: Diagnosis not present

## 2019-03-02 DIAGNOSIS — R45851 Suicidal ideations: Secondary | ICD-10-CM | POA: Diagnosis not present

## 2019-03-02 DIAGNOSIS — F1011 Alcohol abuse, in remission: Secondary | ICD-10-CM | POA: Diagnosis not present

## 2019-03-02 DIAGNOSIS — F332 Major depressive disorder, recurrent severe without psychotic features: Secondary | ICD-10-CM | POA: Diagnosis not present

## 2019-03-02 DIAGNOSIS — F4310 Post-traumatic stress disorder, unspecified: Secondary | ICD-10-CM | POA: Diagnosis not present

## 2019-03-02 DIAGNOSIS — F410 Panic disorder [episodic paroxysmal anxiety] without agoraphobia: Secondary | ICD-10-CM | POA: Diagnosis not present

## 2019-03-02 DIAGNOSIS — R072 Precordial pain: Secondary | ICD-10-CM | POA: Diagnosis not present

## 2019-03-02 DIAGNOSIS — F329 Major depressive disorder, single episode, unspecified: Secondary | ICD-10-CM | POA: Diagnosis not present

## 2019-03-02 DIAGNOSIS — F3163 Bipolar disorder, current episode mixed, severe, without psychotic features: Secondary | ICD-10-CM | POA: Diagnosis not present

## 2019-03-02 DIAGNOSIS — R0609 Other forms of dyspnea: Secondary | ICD-10-CM | POA: Diagnosis not present

## 2019-03-02 DIAGNOSIS — R197 Diarrhea, unspecified: Secondary | ICD-10-CM | POA: Diagnosis not present

## 2019-03-02 DIAGNOSIS — R0602 Shortness of breath: Secondary | ICD-10-CM | POA: Diagnosis not present

## 2019-03-02 DIAGNOSIS — R9431 Abnormal electrocardiogram [ECG] [EKG]: Secondary | ICD-10-CM | POA: Diagnosis not present

## 2019-03-03 DIAGNOSIS — R197 Diarrhea, unspecified: Secondary | ICD-10-CM | POA: Diagnosis not present

## 2019-03-03 DIAGNOSIS — I10 Essential (primary) hypertension: Secondary | ICD-10-CM | POA: Diagnosis present

## 2019-03-03 DIAGNOSIS — F329 Major depressive disorder, single episode, unspecified: Secondary | ICD-10-CM | POA: Diagnosis not present

## 2019-03-03 DIAGNOSIS — Z818 Family history of other mental and behavioral disorders: Secondary | ICD-10-CM | POA: Diagnosis not present

## 2019-03-03 DIAGNOSIS — Z20828 Contact with and (suspected) exposure to other viral communicable diseases: Secondary | ICD-10-CM | POA: Diagnosis not present

## 2019-03-03 DIAGNOSIS — F3163 Bipolar disorder, current episode mixed, severe, without psychotic features: Secondary | ICD-10-CM | POA: Diagnosis present

## 2019-03-03 DIAGNOSIS — F410 Panic disorder [episodic paroxysmal anxiety] without agoraphobia: Secondary | ICD-10-CM | POA: Diagnosis present

## 2019-03-03 DIAGNOSIS — E876 Hypokalemia: Secondary | ICD-10-CM | POA: Diagnosis present

## 2019-03-03 DIAGNOSIS — F1011 Alcohol abuse, in remission: Secondary | ICD-10-CM | POA: Diagnosis present

## 2019-03-03 DIAGNOSIS — Z1159 Encounter for screening for other viral diseases: Secondary | ICD-10-CM | POA: Diagnosis not present

## 2019-03-03 DIAGNOSIS — R0602 Shortness of breath: Secondary | ICD-10-CM | POA: Diagnosis not present

## 2019-03-03 DIAGNOSIS — F332 Major depressive disorder, recurrent severe without psychotic features: Secondary | ICD-10-CM | POA: Diagnosis not present

## 2019-03-03 DIAGNOSIS — F4310 Post-traumatic stress disorder, unspecified: Secondary | ICD-10-CM | POA: Diagnosis not present

## 2019-03-03 DIAGNOSIS — F17210 Nicotine dependence, cigarettes, uncomplicated: Secondary | ICD-10-CM | POA: Diagnosis present

## 2019-03-03 DIAGNOSIS — R112 Nausea with vomiting, unspecified: Secondary | ICD-10-CM | POA: Diagnosis not present

## 2019-03-03 DIAGNOSIS — R1013 Epigastric pain: Secondary | ICD-10-CM | POA: Diagnosis not present

## 2019-03-03 DIAGNOSIS — E782 Mixed hyperlipidemia: Secondary | ICD-10-CM | POA: Diagnosis present

## 2019-03-03 DIAGNOSIS — I4510 Unspecified right bundle-branch block: Secondary | ICD-10-CM | POA: Diagnosis not present

## 2019-03-03 DIAGNOSIS — R072 Precordial pain: Secondary | ICD-10-CM | POA: Diagnosis not present

## 2019-03-03 DIAGNOSIS — J45909 Unspecified asthma, uncomplicated: Secondary | ICD-10-CM | POA: Diagnosis present

## 2019-03-03 DIAGNOSIS — Z915 Personal history of self-harm: Secondary | ICD-10-CM | POA: Diagnosis not present

## 2019-03-03 DIAGNOSIS — R45851 Suicidal ideations: Secondary | ICD-10-CM | POA: Diagnosis present

## 2019-03-03 DIAGNOSIS — E871 Hypo-osmolality and hyponatremia: Secondary | ICD-10-CM | POA: Diagnosis present

## 2019-03-05 ENCOUNTER — Institutional Professional Consult (permissible substitution): Payer: Medicare Other | Admitting: Internal Medicine

## 2019-03-09 DIAGNOSIS — I110 Hypertensive heart disease with heart failure: Secondary | ICD-10-CM | POA: Diagnosis not present

## 2019-03-09 DIAGNOSIS — F17210 Nicotine dependence, cigarettes, uncomplicated: Secondary | ICD-10-CM | POA: Diagnosis not present

## 2019-03-09 DIAGNOSIS — I5031 Acute diastolic (congestive) heart failure: Secondary | ICD-10-CM | POA: Diagnosis not present

## 2019-03-13 DIAGNOSIS — S99911A Unspecified injury of right ankle, initial encounter: Secondary | ICD-10-CM | POA: Diagnosis not present

## 2019-03-13 DIAGNOSIS — M7989 Other specified soft tissue disorders: Secondary | ICD-10-CM | POA: Diagnosis not present

## 2019-03-13 DIAGNOSIS — X500XXA Overexertion from strenuous movement or load, initial encounter: Secondary | ICD-10-CM | POA: Diagnosis not present

## 2019-03-13 DIAGNOSIS — S82831A Other fracture of upper and lower end of right fibula, initial encounter for closed fracture: Secondary | ICD-10-CM | POA: Diagnosis not present

## 2019-03-14 DIAGNOSIS — M25571 Pain in right ankle and joints of right foot: Secondary | ICD-10-CM | POA: Diagnosis not present

## 2019-03-20 DIAGNOSIS — G8929 Other chronic pain: Secondary | ICD-10-CM | POA: Diagnosis not present

## 2019-03-20 DIAGNOSIS — F419 Anxiety disorder, unspecified: Secondary | ICD-10-CM | POA: Diagnosis not present

## 2019-03-20 DIAGNOSIS — Z720 Tobacco use: Secondary | ICD-10-CM | POA: Diagnosis not present

## 2019-03-20 DIAGNOSIS — E669 Obesity, unspecified: Secondary | ICD-10-CM | POA: Diagnosis not present

## 2019-03-20 DIAGNOSIS — R0781 Pleurodynia: Secondary | ICD-10-CM | POA: Diagnosis not present

## 2019-03-20 DIAGNOSIS — E785 Hyperlipidemia, unspecified: Secondary | ICD-10-CM | POA: Diagnosis not present

## 2019-03-20 DIAGNOSIS — F329 Major depressive disorder, single episode, unspecified: Secondary | ICD-10-CM | POA: Diagnosis not present

## 2019-03-20 DIAGNOSIS — R7303 Prediabetes: Secondary | ICD-10-CM | POA: Diagnosis not present

## 2019-03-20 DIAGNOSIS — K580 Irritable bowel syndrome with diarrhea: Secondary | ICD-10-CM | POA: Diagnosis not present

## 2019-03-20 DIAGNOSIS — I10 Essential (primary) hypertension: Secondary | ICD-10-CM | POA: Diagnosis not present

## 2019-03-20 DIAGNOSIS — J302 Other seasonal allergic rhinitis: Secondary | ICD-10-CM | POA: Diagnosis not present

## 2019-03-28 DIAGNOSIS — M25571 Pain in right ankle and joints of right foot: Secondary | ICD-10-CM | POA: Diagnosis not present

## 2019-04-03 DIAGNOSIS — F17210 Nicotine dependence, cigarettes, uncomplicated: Secondary | ICD-10-CM | POA: Diagnosis not present

## 2019-04-03 DIAGNOSIS — I5031 Acute diastolic (congestive) heart failure: Secondary | ICD-10-CM | POA: Diagnosis not present

## 2019-04-03 DIAGNOSIS — I110 Hypertensive heart disease with heart failure: Secondary | ICD-10-CM | POA: Diagnosis not present

## 2019-04-20 DIAGNOSIS — M25571 Pain in right ankle and joints of right foot: Secondary | ICD-10-CM | POA: Diagnosis not present

## 2019-04-30 DIAGNOSIS — M25571 Pain in right ankle and joints of right foot: Secondary | ICD-10-CM | POA: Diagnosis not present

## 2019-05-14 DIAGNOSIS — M25571 Pain in right ankle and joints of right foot: Secondary | ICD-10-CM | POA: Diagnosis not present

## 2019-06-12 DIAGNOSIS — M25571 Pain in right ankle and joints of right foot: Secondary | ICD-10-CM | POA: Diagnosis not present

## 2019-06-14 DIAGNOSIS — M25571 Pain in right ankle and joints of right foot: Secondary | ICD-10-CM | POA: Diagnosis not present

## 2019-06-18 DIAGNOSIS — G90521 Complex regional pain syndrome I of right lower limb: Secondary | ICD-10-CM | POA: Diagnosis not present

## 2019-06-18 DIAGNOSIS — M25571 Pain in right ankle and joints of right foot: Secondary | ICD-10-CM | POA: Diagnosis not present

## 2019-07-03 DIAGNOSIS — M25571 Pain in right ankle and joints of right foot: Secondary | ICD-10-CM | POA: Diagnosis not present

## 2019-07-03 DIAGNOSIS — G90529 Complex regional pain syndrome I of unspecified lower limb: Secondary | ICD-10-CM | POA: Diagnosis not present

## 2019-07-06 DIAGNOSIS — G90529 Complex regional pain syndrome I of unspecified lower limb: Secondary | ICD-10-CM | POA: Diagnosis not present

## 2019-07-06 DIAGNOSIS — M25571 Pain in right ankle and joints of right foot: Secondary | ICD-10-CM | POA: Diagnosis not present

## 2019-08-08 DIAGNOSIS — R030 Elevated blood-pressure reading, without diagnosis of hypertension: Secondary | ICD-10-CM | POA: Diagnosis not present

## 2019-08-08 DIAGNOSIS — G90521 Complex regional pain syndrome I of right lower limb: Secondary | ICD-10-CM | POA: Diagnosis not present

## 2019-08-14 DIAGNOSIS — R030 Elevated blood-pressure reading, without diagnosis of hypertension: Secondary | ICD-10-CM | POA: Diagnosis not present

## 2019-08-14 DIAGNOSIS — G90521 Complex regional pain syndrome I of right lower limb: Secondary | ICD-10-CM | POA: Diagnosis not present

## 2019-08-27 DIAGNOSIS — R293 Abnormal posture: Secondary | ICD-10-CM | POA: Diagnosis not present

## 2019-08-27 DIAGNOSIS — M545 Low back pain: Secondary | ICD-10-CM | POA: Diagnosis not present

## 2019-08-27 DIAGNOSIS — G90521 Complex regional pain syndrome I of right lower limb: Secondary | ICD-10-CM | POA: Diagnosis not present

## 2019-08-27 DIAGNOSIS — R262 Difficulty in walking, not elsewhere classified: Secondary | ICD-10-CM | POA: Diagnosis not present

## 2019-08-27 DIAGNOSIS — M62571 Muscle wasting and atrophy, not elsewhere classified, right ankle and foot: Secondary | ICD-10-CM | POA: Diagnosis not present

## 2019-08-27 DIAGNOSIS — M25571 Pain in right ankle and joints of right foot: Secondary | ICD-10-CM | POA: Diagnosis not present

## 2019-08-27 DIAGNOSIS — M6281 Muscle weakness (generalized): Secondary | ICD-10-CM | POA: Diagnosis not present

## 2019-08-29 ENCOUNTER — Other Ambulatory Visit (HOSPITAL_COMMUNITY): Payer: Self-pay | Admitting: Anesthesiology

## 2019-08-29 DIAGNOSIS — R262 Difficulty in walking, not elsewhere classified: Secondary | ICD-10-CM | POA: Diagnosis not present

## 2019-08-29 DIAGNOSIS — G90521 Complex regional pain syndrome I of right lower limb: Secondary | ICD-10-CM

## 2019-08-29 DIAGNOSIS — R293 Abnormal posture: Secondary | ICD-10-CM | POA: Diagnosis not present

## 2019-08-29 DIAGNOSIS — M545 Low back pain: Secondary | ICD-10-CM | POA: Diagnosis not present

## 2019-08-29 DIAGNOSIS — M62571 Muscle wasting and atrophy, not elsewhere classified, right ankle and foot: Secondary | ICD-10-CM | POA: Diagnosis not present

## 2019-08-29 DIAGNOSIS — M25571 Pain in right ankle and joints of right foot: Secondary | ICD-10-CM | POA: Diagnosis not present

## 2019-08-29 DIAGNOSIS — M6281 Muscle weakness (generalized): Secondary | ICD-10-CM | POA: Diagnosis not present

## 2019-08-30 ENCOUNTER — Telehealth: Payer: Self-pay | Admitting: *Deleted

## 2019-09-03 ENCOUNTER — Telehealth: Payer: Self-pay | Admitting: *Deleted

## 2019-09-03 DIAGNOSIS — R262 Difficulty in walking, not elsewhere classified: Secondary | ICD-10-CM | POA: Diagnosis not present

## 2019-09-03 DIAGNOSIS — M545 Low back pain: Secondary | ICD-10-CM | POA: Diagnosis not present

## 2019-09-03 DIAGNOSIS — M6281 Muscle weakness (generalized): Secondary | ICD-10-CM | POA: Diagnosis not present

## 2019-09-03 DIAGNOSIS — M62571 Muscle wasting and atrophy, not elsewhere classified, right ankle and foot: Secondary | ICD-10-CM | POA: Diagnosis not present

## 2019-09-03 DIAGNOSIS — G90521 Complex regional pain syndrome I of right lower limb: Secondary | ICD-10-CM | POA: Diagnosis not present

## 2019-09-03 DIAGNOSIS — R293 Abnormal posture: Secondary | ICD-10-CM | POA: Diagnosis not present

## 2019-09-03 DIAGNOSIS — M25571 Pain in right ankle and joints of right foot: Secondary | ICD-10-CM | POA: Diagnosis not present

## 2019-09-06 DIAGNOSIS — K580 Irritable bowel syndrome with diarrhea: Secondary | ICD-10-CM | POA: Diagnosis not present

## 2019-09-19 DIAGNOSIS — Z1159 Encounter for screening for other viral diseases: Secondary | ICD-10-CM | POA: Diagnosis not present

## 2019-10-04 DIAGNOSIS — Z79891 Long term (current) use of opiate analgesic: Secondary | ICD-10-CM | POA: Diagnosis not present

## 2019-10-04 DIAGNOSIS — G90521 Complex regional pain syndrome I of right lower limb: Secondary | ICD-10-CM | POA: Diagnosis not present

## 2019-11-02 DIAGNOSIS — Z131 Encounter for screening for diabetes mellitus: Secondary | ICD-10-CM | POA: Diagnosis not present

## 2019-11-02 DIAGNOSIS — F329 Major depressive disorder, single episode, unspecified: Secondary | ICD-10-CM | POA: Diagnosis not present

## 2019-11-02 DIAGNOSIS — Q829 Congenital malformation of skin, unspecified: Secondary | ICD-10-CM | POA: Diagnosis not present

## 2019-11-02 DIAGNOSIS — Z720 Tobacco use: Secondary | ICD-10-CM | POA: Diagnosis not present

## 2019-11-02 DIAGNOSIS — K580 Irritable bowel syndrome with diarrhea: Secondary | ICD-10-CM | POA: Diagnosis not present

## 2019-11-02 DIAGNOSIS — F419 Anxiety disorder, unspecified: Secondary | ICD-10-CM | POA: Diagnosis not present

## 2019-11-02 DIAGNOSIS — G8929 Other chronic pain: Secondary | ICD-10-CM | POA: Diagnosis not present

## 2019-11-02 DIAGNOSIS — I10 Essential (primary) hypertension: Secondary | ICD-10-CM | POA: Diagnosis not present

## 2019-11-02 DIAGNOSIS — Z136 Encounter for screening for cardiovascular disorders: Secondary | ICD-10-CM | POA: Diagnosis not present

## 2019-11-02 DIAGNOSIS — E785 Hyperlipidemia, unspecified: Secondary | ICD-10-CM | POA: Diagnosis not present

## 2019-11-02 DIAGNOSIS — Z0110 Encounter for examination of ears and hearing without abnormal findings: Secondary | ICD-10-CM | POA: Diagnosis not present

## 2019-11-02 DIAGNOSIS — Z0100 Encounter for examination of eyes and vision without abnormal findings: Secondary | ICD-10-CM | POA: Diagnosis not present

## 2019-11-07 DIAGNOSIS — Z1159 Encounter for screening for other viral diseases: Secondary | ICD-10-CM | POA: Diagnosis not present

## 2019-11-07 DIAGNOSIS — F4310 Post-traumatic stress disorder, unspecified: Secondary | ICD-10-CM | POA: Diagnosis not present

## 2019-11-12 ENCOUNTER — Other Ambulatory Visit: Payer: Self-pay | Admitting: Internal Medicine

## 2019-11-12 DIAGNOSIS — K621 Rectal polyp: Secondary | ICD-10-CM | POA: Diagnosis not present

## 2019-11-12 DIAGNOSIS — R197 Diarrhea, unspecified: Secondary | ICD-10-CM | POA: Diagnosis not present

## 2019-11-12 DIAGNOSIS — Z1231 Encounter for screening mammogram for malignant neoplasm of breast: Secondary | ICD-10-CM

## 2019-11-12 DIAGNOSIS — D120 Benign neoplasm of cecum: Secondary | ICD-10-CM | POA: Diagnosis not present

## 2019-11-12 DIAGNOSIS — K635 Polyp of colon: Secondary | ICD-10-CM | POA: Diagnosis not present

## 2019-11-12 LAB — HM COLONOSCOPY

## 2019-11-14 DIAGNOSIS — D120 Benign neoplasm of cecum: Secondary | ICD-10-CM | POA: Diagnosis not present

## 2019-11-14 DIAGNOSIS — F0634 Mood disorder due to known physiological condition with mixed features: Secondary | ICD-10-CM | POA: Diagnosis not present

## 2019-11-14 DIAGNOSIS — K621 Rectal polyp: Secondary | ICD-10-CM | POA: Diagnosis not present

## 2019-11-14 DIAGNOSIS — K635 Polyp of colon: Secondary | ICD-10-CM | POA: Diagnosis not present

## 2019-11-15 DIAGNOSIS — F0634 Mood disorder due to known physiological condition with mixed features: Secondary | ICD-10-CM | POA: Diagnosis not present

## 2019-11-16 ENCOUNTER — Ambulatory Visit: Payer: Medicare Other

## 2019-11-16 DIAGNOSIS — F0634 Mood disorder due to known physiological condition with mixed features: Secondary | ICD-10-CM | POA: Diagnosis not present

## 2019-11-17 DIAGNOSIS — F0634 Mood disorder due to known physiological condition with mixed features: Secondary | ICD-10-CM | POA: Diagnosis not present

## 2019-11-18 DIAGNOSIS — F0634 Mood disorder due to known physiological condition with mixed features: Secondary | ICD-10-CM | POA: Diagnosis not present

## 2019-11-30 DIAGNOSIS — F4310 Post-traumatic stress disorder, unspecified: Secondary | ICD-10-CM | POA: Diagnosis not present

## 2019-12-11 DIAGNOSIS — J0100 Acute maxillary sinusitis, unspecified: Secondary | ICD-10-CM | POA: Diagnosis not present

## 2019-12-11 DIAGNOSIS — Z20822 Contact with and (suspected) exposure to covid-19: Secondary | ICD-10-CM | POA: Diagnosis not present

## 2019-12-12 DIAGNOSIS — Z1159 Encounter for screening for other viral diseases: Secondary | ICD-10-CM | POA: Diagnosis not present

## 2019-12-21 ENCOUNTER — Ambulatory Visit
Admission: RE | Admit: 2019-12-21 | Discharge: 2019-12-21 | Disposition: A | Discharge status: Home / Self Care | Payer: Medicare Other | Source: Ambulatory Visit | Attending: Internal Medicine | Admitting: Internal Medicine

## 2019-12-21 ENCOUNTER — Other Ambulatory Visit: Payer: Self-pay | Admitting: Internal Medicine

## 2019-12-21 ENCOUNTER — Other Ambulatory Visit: Payer: Self-pay

## 2019-12-21 DIAGNOSIS — N6452 Nipple discharge: Secondary | ICD-10-CM

## 2019-12-21 DIAGNOSIS — N644 Mastodynia: Secondary | ICD-10-CM

## 2019-12-21 DIAGNOSIS — N63 Unspecified lump in unspecified breast: Secondary | ICD-10-CM

## 2019-12-21 DIAGNOSIS — Z1231 Encounter for screening mammogram for malignant neoplasm of breast: Secondary | ICD-10-CM

## 2019-12-28 DIAGNOSIS — L821 Other seborrheic keratosis: Secondary | ICD-10-CM | POA: Diagnosis not present

## 2019-12-28 DIAGNOSIS — L820 Inflamed seborrheic keratosis: Secondary | ICD-10-CM | POA: Diagnosis not present

## 2019-12-28 DIAGNOSIS — D492 Neoplasm of unspecified behavior of bone, soft tissue, and skin: Secondary | ICD-10-CM | POA: Diagnosis not present

## 2019-12-28 DIAGNOSIS — L729 Follicular cyst of the skin and subcutaneous tissue, unspecified: Secondary | ICD-10-CM | POA: Diagnosis not present

## 2020-01-07 DIAGNOSIS — G90521 Complex regional pain syndrome I of right lower limb: Secondary | ICD-10-CM | POA: Diagnosis not present

## 2020-01-09 ENCOUNTER — Other Ambulatory Visit: Payer: Self-pay | Admitting: Physician Assistant

## 2020-01-09 DIAGNOSIS — D229 Melanocytic nevi, unspecified: Secondary | ICD-10-CM | POA: Diagnosis not present

## 2020-01-10 ENCOUNTER — Other Ambulatory Visit: Payer: Medicare Other

## 2020-01-14 DIAGNOSIS — G90521 Complex regional pain syndrome I of right lower limb: Secondary | ICD-10-CM | POA: Diagnosis not present

## 2020-01-21 DIAGNOSIS — Z1152 Encounter for screening for COVID-19: Secondary | ICD-10-CM | POA: Diagnosis not present

## 2020-01-25 DIAGNOSIS — G90521 Complex regional pain syndrome I of right lower limb: Secondary | ICD-10-CM | POA: Diagnosis not present

## 2020-02-08 DIAGNOSIS — G90521 Complex regional pain syndrome I of right lower limb: Secondary | ICD-10-CM | POA: Diagnosis not present

## 2020-02-08 DIAGNOSIS — Z9689 Presence of other specified functional implants: Secondary | ICD-10-CM | POA: Diagnosis not present

## 2020-02-27 DIAGNOSIS — F339 Major depressive disorder, recurrent, unspecified: Secondary | ICD-10-CM | POA: Diagnosis not present

## 2020-05-01 DIAGNOSIS — Z9689 Presence of other specified functional implants: Secondary | ICD-10-CM | POA: Diagnosis not present

## 2020-05-01 DIAGNOSIS — G90521 Complex regional pain syndrome I of right lower limb: Secondary | ICD-10-CM | POA: Diagnosis not present

## 2020-05-22 DIAGNOSIS — Z720 Tobacco use: Secondary | ICD-10-CM | POA: Diagnosis not present

## 2020-05-22 DIAGNOSIS — I10 Essential (primary) hypertension: Secondary | ICD-10-CM | POA: Diagnosis not present

## 2020-05-22 DIAGNOSIS — G8929 Other chronic pain: Secondary | ICD-10-CM | POA: Diagnosis not present

## 2020-05-22 DIAGNOSIS — R197 Diarrhea, unspecified: Secondary | ICD-10-CM | POA: Diagnosis not present

## 2020-05-22 DIAGNOSIS — Z131 Encounter for screening for diabetes mellitus: Secondary | ICD-10-CM | POA: Diagnosis not present

## 2020-05-22 DIAGNOSIS — Z0001 Encounter for general adult medical examination with abnormal findings: Secondary | ICD-10-CM | POA: Diagnosis not present

## 2020-05-22 DIAGNOSIS — F329 Major depressive disorder, single episode, unspecified: Secondary | ICD-10-CM | POA: Diagnosis not present

## 2020-05-22 DIAGNOSIS — F419 Anxiety disorder, unspecified: Secondary | ICD-10-CM | POA: Diagnosis not present

## 2020-05-22 DIAGNOSIS — K580 Irritable bowel syndrome with diarrhea: Secondary | ICD-10-CM | POA: Diagnosis not present

## 2020-05-22 DIAGNOSIS — E785 Hyperlipidemia, unspecified: Secondary | ICD-10-CM | POA: Diagnosis not present

## 2020-06-18 DIAGNOSIS — I10 Essential (primary) hypertension: Secondary | ICD-10-CM | POA: Diagnosis not present

## 2020-06-18 DIAGNOSIS — G8929 Other chronic pain: Secondary | ICD-10-CM | POA: Diagnosis not present

## 2020-06-18 DIAGNOSIS — Z720 Tobacco use: Secondary | ICD-10-CM | POA: Diagnosis not present

## 2020-06-18 DIAGNOSIS — K580 Irritable bowel syndrome with diarrhea: Secondary | ICD-10-CM | POA: Diagnosis not present

## 2020-06-18 DIAGNOSIS — F419 Anxiety disorder, unspecified: Secondary | ICD-10-CM | POA: Diagnosis not present

## 2020-06-18 DIAGNOSIS — E785 Hyperlipidemia, unspecified: Secondary | ICD-10-CM | POA: Diagnosis not present

## 2020-06-18 DIAGNOSIS — F329 Major depressive disorder, single episode, unspecified: Secondary | ICD-10-CM | POA: Diagnosis not present

## 2020-06-23 DIAGNOSIS — M25571 Pain in right ankle and joints of right foot: Secondary | ICD-10-CM | POA: Diagnosis not present

## 2020-06-25 DIAGNOSIS — M25571 Pain in right ankle and joints of right foot: Secondary | ICD-10-CM | POA: Diagnosis not present

## 2020-06-25 DIAGNOSIS — G90521 Complex regional pain syndrome I of right lower limb: Secondary | ICD-10-CM | POA: Diagnosis not present

## 2020-07-09 DIAGNOSIS — K580 Irritable bowel syndrome with diarrhea: Secondary | ICD-10-CM | POA: Diagnosis not present

## 2020-07-09 DIAGNOSIS — I10 Essential (primary) hypertension: Secondary | ICD-10-CM | POA: Diagnosis not present

## 2020-07-09 DIAGNOSIS — E785 Hyperlipidemia, unspecified: Secondary | ICD-10-CM | POA: Diagnosis not present

## 2020-07-09 DIAGNOSIS — Z720 Tobacco use: Secondary | ICD-10-CM | POA: Diagnosis not present

## 2020-07-09 DIAGNOSIS — F329 Major depressive disorder, single episode, unspecified: Secondary | ICD-10-CM | POA: Diagnosis not present

## 2020-07-09 DIAGNOSIS — Z23 Encounter for immunization: Secondary | ICD-10-CM | POA: Diagnosis not present

## 2020-07-09 DIAGNOSIS — F419 Anxiety disorder, unspecified: Secondary | ICD-10-CM | POA: Diagnosis not present

## 2020-07-09 DIAGNOSIS — G8929 Other chronic pain: Secondary | ICD-10-CM | POA: Diagnosis not present

## 2020-07-14 DIAGNOSIS — G90521 Complex regional pain syndrome I of right lower limb: Secondary | ICD-10-CM | POA: Diagnosis not present

## 2020-07-14 DIAGNOSIS — M25571 Pain in right ankle and joints of right foot: Secondary | ICD-10-CM | POA: Diagnosis not present

## 2020-07-23 DIAGNOSIS — Z9689 Presence of other specified functional implants: Secondary | ICD-10-CM | POA: Diagnosis not present

## 2020-07-23 DIAGNOSIS — M25571 Pain in right ankle and joints of right foot: Secondary | ICD-10-CM | POA: Diagnosis not present

## 2020-07-23 DIAGNOSIS — G90521 Complex regional pain syndrome I of right lower limb: Secondary | ICD-10-CM | POA: Diagnosis not present

## 2020-08-29 DIAGNOSIS — Z9689 Presence of other specified functional implants: Secondary | ICD-10-CM | POA: Diagnosis not present

## 2020-09-05 DIAGNOSIS — Z9689 Presence of other specified functional implants: Secondary | ICD-10-CM | POA: Diagnosis not present

## 2020-09-05 DIAGNOSIS — T85840A Pain due to nervous system prosthetic devices, implants and grafts, initial encounter: Secondary | ICD-10-CM | POA: Diagnosis not present

## 2020-09-05 DIAGNOSIS — G90521 Complex regional pain syndrome I of right lower limb: Secondary | ICD-10-CM | POA: Diagnosis not present

## 2020-09-05 DIAGNOSIS — G894 Chronic pain syndrome: Secondary | ICD-10-CM | POA: Diagnosis not present

## 2020-09-17 DIAGNOSIS — M25371 Other instability, right ankle: Secondary | ICD-10-CM | POA: Diagnosis not present

## 2020-09-17 DIAGNOSIS — G90521 Complex regional pain syndrome I of right lower limb: Secondary | ICD-10-CM | POA: Diagnosis not present

## 2020-09-24 ENCOUNTER — Other Ambulatory Visit: Payer: Medicare Other

## 2020-09-24 DIAGNOSIS — Z20822 Contact with and (suspected) exposure to covid-19: Secondary | ICD-10-CM

## 2020-09-29 LAB — NOVEL CORONAVIRUS, NAA

## 2020-09-30 ENCOUNTER — Other Ambulatory Visit: Payer: Medicare Other

## 2021-03-09 ENCOUNTER — Ambulatory Visit: Payer: Medicare Other | Admitting: Cardiology

## 2021-03-09 ENCOUNTER — Other Ambulatory Visit: Payer: Self-pay

## 2021-03-09 ENCOUNTER — Encounter: Payer: Self-pay | Admitting: Cardiology

## 2021-03-09 VITALS — BP 98/60 | HR 108 | Temp 98.0°F | Resp 16 | Ht 62.0 in | Wt 167.0 lb

## 2021-03-09 DIAGNOSIS — R0600 Dyspnea, unspecified: Secondary | ICD-10-CM

## 2021-03-09 DIAGNOSIS — I517 Cardiomegaly: Secondary | ICD-10-CM

## 2021-03-09 DIAGNOSIS — R0609 Other forms of dyspnea: Secondary | ICD-10-CM

## 2021-03-09 NOTE — Progress Notes (Signed)
Patient referred by [name], MD for shortness of breath, cardiomegaly  Subjective:   [name], female    DOB: Apr 23, 1966, 55 y.o.   MRN: [id number]   Chief Complaint  Patient presents with   cardiomegaly   New Patient (Initial Visit)     HPI  55 y.o. Caucasian female with history of family h/o CAD, personal h/o severe major depression, prior suicide attempts, history of alcohol abuse, h/o COVID  Patient had [place] in 2021.  Since then, patient has had progressive worsening of shortness of breath, particularly worse in last 1 month.  She gets short of breath with minimal activity, such as walking from parking lot to our office.  She also reports leg swelling, right greater than left.  Patient also has experienced chest pain with exertion, improved with rest.  She underwent chest x-ray through her PCP that showed cardiomegaly and possible pericardial effusion.   On a separate note, patient used to undergo ECT treatments for her major depression.  She had a Port-A-Cath placed, which has not been used in last 2 years.  She reports pain associated with the Port-A-Cath and wants to know how she can get it removed.  Past Medical History:  Diagnosis Date   Anorexia    Anxiety    Depression    DJD (degenerative joint disease) of cervical spine    Endometriosis    ETOH abuse    sober 3 1/2 years   Migraines    PICC (peripherally inserted central catheter) in place    rt neck   Psychiatric pseudoseizure    Seizures ([place])      Past Surgical History:  Procedure Laterality Date   ABDOMINAL HYSTERECTOMY     ABDOMINAL SURGERY     APPENDECTOMY     CARPAL TUNNEL RELEASE     2010   CHOLECYSTECTOMY     ESOPHAGOGASTRODUODENOSCOPY N/A 10/06/2014   Procedure: ESOPHAGOGASTRODUODENOSCOPY (EGD);  Surgeon: Inda Castle, MD;  Location: [place];  Service: Endoscopy;  Laterality: N/A;   KNEE SURGERY     laproscopy     NASAL SINUS SURGERY       Social History    Tobacco Use  Smoking Status Every Day   Packs/day: 0.15   Years: 20.00   Pack years: 3.00   Types: Cigarettes  Smokeless Tobacco Never    Social History   Substance and Sexual Activity  Alcohol Use Not Currently     Family History  Problem Relation Age of Onset   Hypertension Mother    Hyperlipidemia Mother    Heart failure Father    Heart attack Father    Cancer Other      Current Outpatient Medications on File Prior to Visit  Medication Sig Dispense Refill   acetaminophen (TYLENOL) 500 MG tablet Take 500-1,000 mg by mouth every 6 (six) hours as needed for moderate pain.     amitriptyline (ELAVIL) 25 MG tablet Take 25 mg by mouth 2 (two) times daily.     amLODipine (NORVASC) 10 MG tablet Take 10 mg by mouth daily.     budesonide (PULMICORT) 0.25 MG/2ML nebulizer solution Take 2 mLs (0.25 mg total) by nebulization 2 (two) times daily. 60 mL 2   clonazePAM (KLONOPIN) 0.5 MG tablet Take 0.5 mg by mouth 3 (three) times daily.     diphenoxylate-atropine (LOMOTIL) 2.5-0.025 MG tablet Take 1 tablet by mouth 4 (four) times daily as needed for diarrhea or loose stools.     FLUoxetine (PROZAC)  20 MG capsule Take 1 capsule (20 mg total) by mouth daily. (Patient not taking: Reported on 05/24/2018)  3   fluticasone (FLONASE) 50 MCG/ACT nasal spray Place 1 spray into both nostrils daily as needed for allergies or rhinitis.     furosemide (LASIX) 20 MG tablet Take 1 tablet (20 mg total) by mouth daily. 30 tablet 11   methocarbamol (ROBAXIN) 500 MG tablet Take 1,000 mg by mouth 3 (three) times daily as needed for muscle spasms.     mirtazapine (REMERON) 15 MG tablet Take 15 mg by mouth at bedtime.     Multiple Vitamin (MULTIVITAMIN WITH MINERALS) TABS tablet Take 1 tablet by mouth daily.     QUEtiapine (SEROQUEL) 100 MG tablet Take 100 mg by mouth at bedtime.     rosuvastatin (CRESTOR) 20 MG tablet Take 20 mg by mouth at bedtime.     No current facility-administered medications on file  prior to visit.    Cardiovascular and other pertinent studies:  EKG 03/09/2021: Sinus tachycardia 102 bpm  Low voltage Poor R wave progression Nonspecific T-abnormality    Chest X-ray 03/03/2021: Marked cardiomegaly   Recent labs: Not available   Review of Systems  Cardiovascular:  Positive for chest pain and dyspnea on exertion. Negative for leg swelling, palpitations and syncope.  Respiratory:  Positive for shortness of breath.         Vitals:   03/09/21 1416  BP: 98/60  Pulse: (!) 108  Resp: 16  Temp: 98 F (36.7 C)  SpO2: 95%     Body mass index is 30.54 kg/m. Filed Weights   03/09/21 1416  Weight: 167 lb (75.8 kg)     Objective:   Physical Exam Vitals and nursing note reviewed.  Constitutional:      General: She is not in acute distress. Neck:     Vascular: No JVD.  Cardiovascular:     Rate and Rhythm: Regular rhythm. Tachycardia present.     Heart sounds: Normal heart sounds. No murmur heard. Pulmonary:     Effort: Pulmonary effort is normal.     Breath sounds: Normal breath sounds. No wheezing or rales.  Musculoskeletal:     Right lower leg: Edema (2+) present.     Left lower leg: Edema (1+) present.        Assessment & Recommendations:    54 y.o. Caucasian female with history of family h/o CAD, personal h/o severe major depression, prior suicide attempts, history of alcohol and nicotine dependence, h/o COVID  Shortness of breath, chest pain: Reported cardiomegaly on chest x-ray, images not available for my review.  This was a single view chest x-ray.  I will repeat a two-view chest x-ray and also obtain an echocardiogram.  If she does have a pericardial effusion, by history, it appears to be at least subacute, if not chronic.  I do not see any physical exam signs of tamponade.  I will obtain labs including CBC, BMP, ESR, CRP.  If there is no pericardial effusion, I will then obtain exercise nuclear stress test.  In the meantime, I have  reduced her amlodipine from 10 mg to 5 mg daily given her low normal blood pressure and bilateral leg edema.  Also, should she have pericardial effusion, it would be helpful to get her blood pressure running higher.  Further recommendations after above testing.  Thank you for referring the patient to us. Please feel free to contact with any questions.   Brice Potteiger J Yoona Ishii, MD Pager: [phone]   Office: [phone]

## 2021-03-09 NOTE — H&P (View-Only) (Signed)
Patient referred by [name], MD for shortness of breath, cardiomegaly  Subjective:   [name], female    DOB: May 10, 1966, 55 y.o.   MRN: [id number]   Chief Complaint  Patient presents with   cardiomegaly   New Patient (Initial Visit)     HPI  55 y.o. Caucasian female with history of family h/o CAD, personal h/o severe major depression, prior suicide attempts, history of alcohol abuse, h/o COVID  Patient had [place] in 2021.  Since then, patient has had progressive worsening of shortness of breath, particularly worse in last 1 month.  She gets short of breath with minimal activity, such as walking from parking lot to our office.  She also reports leg swelling, right greater than left.  Patient also has experienced chest pain with exertion, improved with rest.  She underwent chest x-ray through her PCP that showed cardiomegaly and possible pericardial effusion.   On a separate note, patient used to undergo ECT treatments for her major depression.  She had a Port-A-Cath placed, which has not been used in last 2 years.  She reports pain associated with the Port-A-Cath and wants to know how she can get it removed.  Past Medical History:  Diagnosis Date   Anorexia    Anxiety    Depression    DJD (degenerative joint disease) of cervical spine    Endometriosis    ETOH abuse    sober 3 1/2 years   Migraines    PICC (peripherally inserted central catheter) in place    rt neck   Psychiatric pseudoseizure    Seizures ([place])      Past Surgical History:  Procedure Laterality Date   ABDOMINAL HYSTERECTOMY     ABDOMINAL SURGERY     APPENDECTOMY     CARPAL TUNNEL RELEASE     2010   CHOLECYSTECTOMY     ESOPHAGOGASTRODUODENOSCOPY N/A 10/06/2014   Procedure: ESOPHAGOGASTRODUODENOSCOPY (EGD);  Surgeon: Inda Castle, MD;  Location: [place];  Service: Endoscopy;  Laterality: N/A;   KNEE SURGERY     laproscopy     NASAL SINUS SURGERY       Social History    Tobacco Use  Smoking Status Every Day   Packs/day: 0.15   Years: 20.00   Pack years: 3.00   Types: Cigarettes  Smokeless Tobacco Never    Social History   Substance and Sexual Activity  Alcohol Use Not Currently     Family History  Problem Relation Age of Onset   Hypertension Mother    Hyperlipidemia Mother    Heart failure Father    Heart attack Father    Cancer Other      Current Outpatient Medications on File Prior to Visit  Medication Sig Dispense Refill   acetaminophen (TYLENOL) 500 MG tablet Take 500-1,000 mg by mouth every 6 (six) hours as needed for moderate pain.     amitriptyline (ELAVIL) 25 MG tablet Take 25 mg by mouth 2 (two) times daily.     amLODipine (NORVASC) 10 MG tablet Take 10 mg by mouth daily.     budesonide (PULMICORT) 0.25 MG/2ML nebulizer solution Take 2 mLs (0.25 mg total) by nebulization 2 (two) times daily. 60 mL 2   clonazePAM (KLONOPIN) 0.5 MG tablet Take 0.5 mg by mouth 3 (three) times daily.     diphenoxylate-atropine (LOMOTIL) 2.5-0.025 MG tablet Take 1 tablet by mouth 4 (four) times daily as needed for diarrhea or loose stools.     FLUoxetine (PROZAC)  20 MG capsule Take 1 capsule (20 mg total) by mouth daily. (Patient not taking: Reported on 05/24/2018)  3   fluticasone (FLONASE) 50 MCG/ACT nasal spray Place 1 spray into both nostrils daily as needed for allergies or rhinitis.     furosemide (LASIX) 20 MG tablet Take 1 tablet (20 mg total) by mouth daily. 30 tablet 11   methocarbamol (ROBAXIN) 500 MG tablet Take 1,000 mg by mouth 3 (three) times daily as needed for muscle spasms.     mirtazapine (REMERON) 15 MG tablet Take 15 mg by mouth at bedtime.     Multiple Vitamin (MULTIVITAMIN WITH MINERALS) TABS tablet Take 1 tablet by mouth daily.     QUEtiapine (SEROQUEL) 100 MG tablet Take 100 mg by mouth at bedtime.     rosuvastatin (CRESTOR) 20 MG tablet Take 20 mg by mouth at bedtime.     No current facility-administered medications on file  prior to visit.    Cardiovascular and other pertinent studies:  EKG 03/09/2021: Sinus tachycardia 102 bpm  Low voltage Poor R wave progression Nonspecific T-abnormality    Chest X-ray 03/03/2021: Marked cardiomegaly   Recent labs: Not available   Review of Systems  Cardiovascular:  Positive for chest pain and dyspnea on exertion. Negative for leg swelling, palpitations and syncope.  Respiratory:  Positive for shortness of breath.         Vitals:   03/09/21 1416  BP: 98/60  Pulse: (!) 108  Resp: 16  Temp: 98 F (36.7 C)  SpO2: 95%     Body mass index is 30.54 kg/m. Filed Weights   03/09/21 1416  Weight: 167 lb (75.8 kg)     Objective:   Physical Exam Vitals and nursing note reviewed.  Constitutional:      General: She is not in acute distress. Neck:     Vascular: No JVD.  Cardiovascular:     Rate and Rhythm: Regular rhythm. Tachycardia present.     Heart sounds: Normal heart sounds. No murmur heard. Pulmonary:     Effort: Pulmonary effort is normal.     Breath sounds: Normal breath sounds. No wheezing or rales.  Musculoskeletal:     Right lower leg: Edema (2+) present.     Left lower leg: Edema (1+) present.        Assessment & Recommendations:    55 y.o. Caucasian female with history of family h/o CAD, personal h/o severe major depression, prior suicide attempts, history of alcohol and nicotine dependence, h/o COVID  Shortness of breath, chest pain: Reported cardiomegaly on chest x-ray, images not available for my review.  This was a single view chest x-ray.  I will repeat a two-view chest x-ray and also obtain an echocardiogram.  If she does have a pericardial effusion, by history, it appears to be at least subacute, if not chronic.  I do not see any physical exam signs of tamponade.  I will obtain labs including CBC, BMP, ESR, CRP.  If there is no pericardial effusion, I will then obtain exercise nuclear stress test.  In the meantime, I have  reduced her amlodipine from 10 mg to 5 mg daily given her low normal blood pressure and bilateral leg edema.  Also, should she have pericardial effusion, it would be helpful to get her blood pressure running higher.  Further recommendations after above testing.  Thank you for referring the patient to [place]. Please feel free to contact with any questions.   Nigel Mormon, MD Pager: [phone]  Office: [phone]

## 2021-03-12 ENCOUNTER — Ambulatory Visit: Payer: Medicare Other | Admitting: Cardiology

## 2021-03-12 ENCOUNTER — Other Ambulatory Visit: Payer: Self-pay

## 2021-03-12 ENCOUNTER — Ambulatory Visit: Payer: Medicare Other

## 2021-03-12 DIAGNOSIS — R0609 Other forms of dyspnea: Secondary | ICD-10-CM

## 2021-03-12 DIAGNOSIS — R0600 Dyspnea, unspecified: Secondary | ICD-10-CM

## 2021-03-12 DIAGNOSIS — I517 Cardiomegaly: Secondary | ICD-10-CM

## 2021-03-14 ENCOUNTER — Telehealth: Payer: Self-pay | Admitting: Cardiology

## 2021-03-14 DIAGNOSIS — R0600 Dyspnea, unspecified: Secondary | ICD-10-CM

## 2021-03-14 DIAGNOSIS — I314 Cardiac tamponade: Secondary | ICD-10-CM | POA: Diagnosis present

## 2021-03-14 DIAGNOSIS — R0609 Other forms of dyspnea: Secondary | ICD-10-CM

## 2021-03-14 DIAGNOSIS — I517 Cardiomegaly: Secondary | ICD-10-CM

## 2021-03-14 NOTE — Telephone Encounter (Addendum)
Reviewed outpatient echocardiogram performed 03/12/2021. Large pericardial effusion with tamponade physiology. Will arrange inpatient admission and pericardiocentesis.  Echocardiogram 03/12/2021:  1. The left ventricle has normal systolic function with an ejection  fraction of 60-65%. The cavity size was normal. There is mildly increased  left ventricular wall thickness. Left ventricular diastolic Doppler  parameters are consistent with impaired  relaxation. No evidence of left ventricular regional wall motion  abnormalities.   2. The right ventricle has normal systolic function. The cavity was  normal. There is no increase in right ventricular wall thickness.   3. Small circumerential pericardial effusion without tamponade.   4. No evidence of mitral valve stenosis.   5. The aortic valve is tricuspid. No stenosis of the aortic valve.   6. The aortic root and ascending aorta are normal in size and structure.   7. Normal IVC size. No complete TR doppler jet so unable to estimate PA  systolic pressure.    Nigel Mormon, MD Pager: [phone] Office: [phone]  Addendum: Bed control called the patient to electively admit her to [place] or [facility]. However, patient is unable to get admitted today for personal reasons and would like to get admitted tomorrow instead. I have left her a voicemail asking her to call 911 immediately, if she has further worsening of her shortness of breath, presyncope or syncope.   Nigel Mormon, MD Pager: [phone] Office: [phone]

## 2021-03-15 ENCOUNTER — Encounter (HOSPITAL_COMMUNITY): Payer: Self-pay | Admitting: Cardiology

## 2021-03-15 ENCOUNTER — Inpatient Hospital Stay (HOSPITAL_COMMUNITY): Payer: Medicare Other

## 2021-03-15 ENCOUNTER — Inpatient Hospital Stay (HOSPITAL_COMMUNITY)
Admission: AD | Admit: 2021-03-15 | Discharge: 2021-03-22 | DRG: 272 | Disposition: A | Discharge status: Home / Self Care | Payer: Medicare Other | Source: Ambulatory Visit | Attending: Cardiology | Admitting: Cardiology

## 2021-03-15 DIAGNOSIS — Z9151 Personal history of suicidal behavior: Secondary | ICD-10-CM

## 2021-03-15 DIAGNOSIS — Z91040 Latex allergy status: Secondary | ICD-10-CM

## 2021-03-15 DIAGNOSIS — Z8616 Personal history of COVID-19: Secondary | ICD-10-CM | POA: Diagnosis not present

## 2021-03-15 DIAGNOSIS — I3139 Other pericardial effusion (noninflammatory): Secondary | ICD-10-CM | POA: Diagnosis present

## 2021-03-15 DIAGNOSIS — Z8249 Family history of ischemic heart disease and other diseases of the circulatory system: Secondary | ICD-10-CM

## 2021-03-15 DIAGNOSIS — Z91041 Radiographic dye allergy status: Secondary | ICD-10-CM

## 2021-03-15 DIAGNOSIS — F329 Major depressive disorder, single episode, unspecified: Secondary | ICD-10-CM | POA: Diagnosis present

## 2021-03-15 DIAGNOSIS — F419 Anxiety disorder, unspecified: Secondary | ICD-10-CM | POA: Diagnosis present

## 2021-03-15 DIAGNOSIS — Z888 Allergy status to other drugs, medicaments and biological substances status: Secondary | ICD-10-CM

## 2021-03-15 DIAGNOSIS — Z83438 Family history of other disorder of lipoprotein metabolism and other lipidemia: Secondary | ICD-10-CM | POA: Diagnosis not present

## 2021-03-15 DIAGNOSIS — Z882 Allergy status to sulfonamides status: Secondary | ICD-10-CM

## 2021-03-15 DIAGNOSIS — F17210 Nicotine dependence, cigarettes, uncomplicated: Secondary | ICD-10-CM | POA: Diagnosis present

## 2021-03-15 DIAGNOSIS — I119 Hypertensive heart disease without heart failure: Secondary | ICD-10-CM | POA: Diagnosis present

## 2021-03-15 DIAGNOSIS — Z9889 Other specified postprocedural states: Secondary | ICD-10-CM

## 2021-03-15 DIAGNOSIS — F1010 Alcohol abuse, uncomplicated: Secondary | ICD-10-CM | POA: Diagnosis present

## 2021-03-15 DIAGNOSIS — Z9109 Other allergy status, other than to drugs and biological substances: Secondary | ICD-10-CM | POA: Diagnosis not present

## 2021-03-15 DIAGNOSIS — I314 Cardiac tamponade: Secondary | ICD-10-CM | POA: Diagnosis present

## 2021-03-15 DIAGNOSIS — Z20822 Contact with and (suspected) exposure to covid-19: Secondary | ICD-10-CM | POA: Diagnosis present

## 2021-03-15 DIAGNOSIS — R569 Unspecified convulsions: Secondary | ICD-10-CM | POA: Diagnosis present

## 2021-03-15 DIAGNOSIS — I5031 Acute diastolic (congestive) heart failure: Secondary | ICD-10-CM | POA: Diagnosis not present

## 2021-03-15 DIAGNOSIS — M1990 Unspecified osteoarthritis, unspecified site: Secondary | ICD-10-CM | POA: Diagnosis present

## 2021-03-15 DIAGNOSIS — I310 Chronic adhesive pericarditis: Secondary | ICD-10-CM | POA: Diagnosis present

## 2021-03-15 DIAGNOSIS — E876 Hypokalemia: Secondary | ICD-10-CM | POA: Diagnosis present

## 2021-03-15 DIAGNOSIS — J90 Pleural effusion, not elsewhere classified: Secondary | ICD-10-CM

## 2021-03-15 DIAGNOSIS — R000 Tachycardia, unspecified: Secondary | ICD-10-CM | POA: Diagnosis present

## 2021-03-15 DIAGNOSIS — Z886 Allergy status to analgesic agent status: Secondary | ICD-10-CM

## 2021-03-15 DIAGNOSIS — I313 Pericardial effusion (noninflammatory): Secondary | ICD-10-CM | POA: Diagnosis present

## 2021-03-15 DIAGNOSIS — Z91011 Allergy to milk products: Secondary | ICD-10-CM

## 2021-03-15 DIAGNOSIS — J939 Pneumothorax, unspecified: Secondary | ICD-10-CM

## 2021-03-15 DIAGNOSIS — Z881 Allergy status to other antibiotic agents status: Secondary | ICD-10-CM | POA: Diagnosis not present

## 2021-03-15 LAB — BASIC METABOLIC PANEL
Anion gap: 9 (ref 5–15)
BUN: 9 mg/dL (ref 6–20)
CO2: 29 mmol/L (ref 22–32)
Calcium: 9.3 mg/dL (ref 8.9–10.3)
Chloride: 100 mmol/L (ref 98–111)
Creatinine, Ser: 0.64 mg/dL (ref 0.44–1.00)
GFR, Estimated: >60 mL/min (ref >60)
Glucose, Bld: 73 mg/dL (ref 70–99)
Potassium: 2.8 mmol/L — ABNORMAL LOW (ref 3.5–5.1)
Sodium: 138 mmol/L (ref 135–145)

## 2021-03-15 LAB — CBC
HCT: 39 % (ref 36.0–46.0)
Hemoglobin: 12.4 g/dL (ref 12.0–15.0)
MCH: 28.8 pg (ref 26.0–34.0)
MCHC: 31.8 g/dL (ref 30.0–36.0)
MCV: 90.5 fL (ref 80.0–100.0)
Platelets: 364 10*3/uL (ref 150–400)
RBC: 4.31 MIL/uL (ref 3.87–5.11)
RDW: 14.9 % (ref 11.5–15.5)
WBC: 11.7 10*3/uL — ABNORMAL HIGH (ref 4.0–10.5)
nRBC: 0 % (ref 0.0–0.2)

## 2021-03-15 LAB — MRSA NEXT GEN BY PCR, NASAL: MRSA by PCR Next Gen: NOT DETECTED

## 2021-03-15 LAB — SARS CORONAVIRUS 2 (TAT 6-24 HRS): SARS Coronavirus 2: NEGATIVE

## 2021-03-15 LAB — C-REACTIVE PROTEIN: CRP: 1 mg/dL — ABNORMAL HIGH (ref <1.0)

## 2021-03-15 LAB — HIV ANTIBODY (ROUTINE TESTING W REFLEX): HIV Screen 4th Generation wRfx: NONREACTIVE

## 2021-03-15 LAB — SEDIMENTATION RATE: Sed Rate: 7 mm/hr (ref 0–22)

## 2021-03-15 MED ORDER — AMITRIPTYLINE HCL 50 MG PO TABS
50.0000 mg | ORAL_TABLET | Freq: Every day | ORAL | Status: DC
  Administered 2021-03-15 – 2021-03-21 (×7): 50 mg via ORAL
  Filled 2021-03-15 (×8): qty 1

## 2021-03-15 MED ORDER — METHOCARBAMOL 500 MG PO TABS
1000.0000 mg | ORAL_TABLET | Freq: Three times a day (TID) | ORAL | Status: DC | PRN
  Administered 2021-03-15 – 2021-03-22 (×16): 1000 mg via ORAL
  Filled 2021-03-15 (×16): qty 2

## 2021-03-15 MED ORDER — MIRTAZAPINE 7.5 MG PO TABS
15.0000 mg | ORAL_TABLET | Freq: Every day | ORAL | Status: DC
  Administered 2021-03-15 – 2021-03-21 (×7): 15 mg via ORAL
  Filled 2021-03-15 (×3): qty 2
  Filled 2021-03-15 (×4): qty 1

## 2021-03-15 MED ORDER — AMITRIPTYLINE HCL 25 MG PO TABS
25.0000 mg | ORAL_TABLET | Freq: Two times a day (BID) | ORAL | Status: DC
  Filled 2021-03-15: qty 1

## 2021-03-15 MED ORDER — SODIUM CHLORIDE 0.9% FLUSH
10.0000 mL | Freq: Two times a day (BID) | INTRAVENOUS | Status: DC
Start: 2021-03-15 — End: 2021-03-22
  Administered 2021-03-15 – 2021-03-22 (×12): 10 mL

## 2021-03-15 MED ORDER — ROSUVASTATIN CALCIUM 20 MG PO TABS
20.0000 mg | ORAL_TABLET | Freq: Every day | ORAL | Status: DC
  Administered 2021-03-15 – 2021-03-21 (×7): 20 mg via ORAL
  Filled 2021-03-15 (×7): qty 1

## 2021-03-15 MED ORDER — QUETIAPINE FUMARATE 100 MG PO TABS
100.0000 mg | ORAL_TABLET | Freq: Every day | ORAL | Status: DC
  Administered 2021-03-15 – 2021-03-21 (×7): 100 mg via ORAL
  Filled 2021-03-15 (×7): qty 1

## 2021-03-15 MED ORDER — CHLORHEXIDINE GLUCONATE CLOTH 2 % EX PADS
6.0000 | MEDICATED_PAD | Freq: Every day | CUTANEOUS | Status: DC
  Administered 2021-03-15 – 2021-03-22 (×6): 6 via TOPICAL

## 2021-03-15 MED ORDER — ENOXAPARIN SODIUM 40 MG/0.4ML IJ SOSY
40.0000 mg | PREFILLED_SYRINGE | INTRAMUSCULAR | Status: DC
  Administered 2021-03-15: 40 mg via SUBCUTANEOUS
  Filled 2021-03-15: qty 0.4

## 2021-03-15 MED ORDER — MOMETASONE FURO-FORMOTEROL FUM 200-5 MCG/ACT IN AERO
2.0000 | INHALATION_SPRAY | Freq: Two times a day (BID) | RESPIRATORY_TRACT | Status: DC
  Administered 2021-03-15 – 2021-03-22 (×13): 2 via RESPIRATORY_TRACT
  Filled 2021-03-15: qty 8.8

## 2021-03-15 MED ORDER — SODIUM CHLORIDE 0.9% FLUSH
10.0000 mL | INTRAVENOUS | Status: DC | PRN
  Administered 2021-03-22: 10 mL

## 2021-03-15 MED ORDER — POTASSIUM CHLORIDE CRYS ER 20 MEQ PO TBCR
40.0000 meq | EXTENDED_RELEASE_TABLET | Freq: Once | ORAL | Status: AC
  Administered 2021-03-15: 40 meq via ORAL
  Filled 2021-03-15: qty 2

## 2021-03-15 MED ORDER — BUSPIRONE HCL 15 MG PO TABS
30.0000 mg | ORAL_TABLET | Freq: Two times a day (BID) | ORAL | Status: DC
  Administered 2021-03-15 – 2021-03-22 (×13): 30 mg via ORAL
  Filled 2021-03-15 (×2): qty 2
  Filled 2021-03-15: qty 3
  Filled 2021-03-15: qty 2
  Filled 2021-03-15 (×3): qty 3
  Filled 2021-03-15: qty 2
  Filled 2021-03-15: qty 3
  Filled 2021-03-15: qty 2
  Filled 2021-03-15 (×3): qty 3
  Filled 2021-03-15: qty 2

## 2021-03-15 MED ORDER — DOCUSATE SODIUM 100 MG PO CAPS: 100.0000 mg | ORAL_CAPSULE | Freq: Two times a day (BID) | ORAL | Status: DC | PRN

## 2021-03-15 MED ORDER — AMLODIPINE BESYLATE 5 MG PO TABS: 5.0000 mg | ORAL_TABLET | Freq: Every day | ORAL | Status: DC

## 2021-03-15 MED ORDER — SODIUM CHLORIDE 0.9 % IV SOLN: INTRAVENOUS | Status: DC

## 2021-03-15 MED ORDER — HYDROCHLOROTHIAZIDE 25 MG PO TABS
25.0000 mg | ORAL_TABLET | Freq: Every day | ORAL | Status: DC
  Administered 2021-03-16 – 2021-03-22 (×5): 25 mg via ORAL
  Filled 2021-03-15 (×7): qty 1

## 2021-03-15 MED ORDER — HYDROCODONE-ACETAMINOPHEN 5-325 MG PO TABS: 1.0000 | ORAL_TABLET | Freq: Three times a day (TID) | ORAL | Status: DC | PRN

## 2021-03-15 MED ORDER — HYDROCODONE-ACETAMINOPHEN 5-325 MG PO TABS
1.0000 | ORAL_TABLET | ORAL | Status: DC | PRN
  Administered 2021-03-15 – 2021-03-22 (×30): 1 via ORAL
  Filled 2021-03-15 (×30): qty 1

## 2021-03-15 MED ORDER — TOPIRAMATE 25 MG PO TABS
25.0000 mg | ORAL_TABLET | Freq: Every day | ORAL | Status: DC
  Administered 2021-03-16 – 2021-03-17 (×2): 25 mg via ORAL
  Filled 2021-03-15 (×4): qty 1

## 2021-03-15 MED ORDER — HYDROCODONE-ACETAMINOPHEN 5-325 MG PO TABS: 1.0000 | ORAL_TABLET | Freq: Four times a day (QID) | ORAL | Status: DC | PRN

## 2021-03-15 MED ORDER — POLYETHYLENE GLYCOL 3350 17 G PO PACK
17.0000 g | PACK | Freq: Every day | ORAL | Status: DC | PRN
  Filled 2021-03-15: qty 1

## 2021-03-15 MED ORDER — ACETAMINOPHEN 500 MG PO TABS: 500.0000 mg | ORAL_TABLET | Freq: Four times a day (QID) | ORAL | Status: DC | PRN

## 2021-03-15 MED ORDER — POTASSIUM CHLORIDE 10 MEQ/50ML IV SOLN
10.0000 meq | INTRAVENOUS | Status: AC
  Administered 2021-03-15 (×4): 10 meq via INTRAVENOUS
  Filled 2021-03-15: qty 50

## 2021-03-15 NOTE — H&P (Signed)
[name] is an 55 y.o. female.   Chief Complaint: Dyspnea on exertion with pericardial effusion  HPI:   55 y.o. Caucasian female with history of family h/o CAD, personal h/o severe major depression, prior suicide attempts, history of alcohol abuse, h/o COVID in 2021.   Patient was last seen in our office by Dr. Virgina Jock 03/09/2021 with dyspnea on exertion with minimal activity as well as leg swelling. Previous chest x-ray was concerning for possible pericardial effusion. Therefore obtained echocardiogram outpatient which revealed large pericardial effusion with concern for tamponade physiology.  Patient was therefore recommended for direct admission to CVICU for pericardiocentesis.  Patient is seen and examined this afternoon.  She continues to have dyspnea on exertion and fatigue which have worsened over the last 6 days since her office visit with Dr. Virgina Jock.  She denies syncope or near syncope.  Denies chest pain, palpitations, dizziness.  Blood pressure is stable at 129/64 mmHg with heart rate 92 bpm at time of exam.   Past Medical History:  Diagnosis Date   Anorexia    Anxiety    Depression    DJD (degenerative joint disease) of cervical spine    Endometriosis    ETOH abuse    sober 3 1/2 years   Migraines    PICC (peripherally inserted central catheter) in place    rt neck   Psychiatric pseudoseizure    Seizures ([place])     Past Surgical History:  Procedure Laterality Date   ABDOMINAL HYSTERECTOMY     ABDOMINAL SURGERY     APPENDECTOMY     CARPAL TUNNEL RELEASE     2010   CHOLECYSTECTOMY     ESOPHAGOGASTRODUODENOSCOPY N/A 10/06/2014   Procedure: ESOPHAGOGASTRODUODENOSCOPY (EGD);  Surgeon: Inda Castle, MD;  Location: [facility];  Service: Endoscopy;  Laterality: N/A;   KNEE SURGERY     laproscopy     NASAL SINUS SURGERY      Family History  Problem Relation Age of Onset   Hypertension Mother    Hyperlipidemia Mother    Heart failure Father    Heart attack  Father    Cancer Other     Social History:  reports that she has been smoking cigarettes. She has a 3.00 pack-year smoking history. She has never used smokeless tobacco. She reports previous alcohol use. She reports previous drug use.  Allergies:  Allergies  Allergen Reactions   Aspirin Anaphylaxis   Doxycycline Anaphylaxis and Other (See Comments)    Joint damage also   Imitrex [Sumatriptan Base] Other (See Comments)    Severe hypertension   Iodinated Diagnostic Agents Anaphylaxis   Lidocaine Anaphylaxis    Pt states she does well with Marcaine/Bupivicaine without problem   Prednisone Other (See Comments)    She goes crazy   Sulfa Antibiotics Anaphylaxis   Sulfasalazine Anaphylaxis   Sumatriptan Other (See Comments)    Felt like she was "having a stroke", heart rate and blood pressure "went through the roof"   Tramadol Other (See Comments)    seizures   Levofloxacin Other (See Comments)    Joints hurt   Amoxicillin-Pot Clavulanate Other (See Comments)    Pt can't remember what type of reaction she had, but states her pharmacy has it listed in their allergy list.   Caffeine-Sodium Benzoate     Other reaction(s): Dizziness (intolerance)   Prochlorperazine Other (See Comments)    Tonic/clonic seizure   Latex Rash   Levofloxacin Rash and Other (See Comments)    Joints  hurt   Metrizamide Other (See Comments)   Nsaids Rash and Other (See Comments)    GI bleed   Tolmetin Rash    Review of Systems  Constitutional: Positive for malaise/fatigue.  Cardiovascular:  Positive for dyspnea on exertion. Negative for chest pain, near-syncope, orthopnea, palpitations, paroxysmal nocturnal dyspnea and syncope.  All other systems reviewed and are negative.   There were no vitals taken for this visit. There is no height or weight on file to calculate BMI.  Physical Exam Vitals reviewed.  Constitutional:      General: She is not in acute distress. HENT:     Head: Normocephalic and  atraumatic.     Right Ear: External ear normal.     Left Ear: External ear normal.     Nose: Nose normal.     Mouth/Throat:     Mouth: Mucous membranes are moist.  Eyes:     Extraocular Movements: Extraocular movements intact.     Conjunctiva/sclera: Conjunctivae normal.  Cardiovascular:     Rate and Rhythm: Normal rate and regular rhythm.     Pulses: Intact distal pulses.     Heart sounds: S1 normal and S2 normal. No murmur heard.   No gallop.     Comments: No evidence of pulsus paradoxus. Pulmonary:     Effort: Pulmonary effort is normal. No respiratory distress.     Breath sounds: No wheezing, rhonchi or rales.  Abdominal:     General: Bowel sounds are normal. There is no distension.     Palpations: Abdomen is soft.  Musculoskeletal:     Cervical back: Normal range of motion and neck supple.     Right lower leg: Edema (trace) present.     Left lower leg: No edema.  Skin:    General: Skin is warm and dry.  Neurological:     General: No focal deficit present.     Mental Status: She is alert and oriented to person, place, and time.  Psychiatric:        Mood and Affect: Mood normal.        Behavior: Behavior normal.    No results found for this or any previous visit (from the past 48 hour(s)).  Labs:   Lab Results  Component Value Date   WBC 8.3 01/31/2019   HGB 13.7 01/31/2019   HCT 40.5 01/31/2019   MCV 97.4 01/31/2019   PLT 294 01/31/2019   No results for input(s): NA, K, CL, CO2, BUN, CREATININE, CALCIUM, PROT, BILITOT, ALKPHOS, ALT, AST, GLUCOSE in the last 168 hours.  Invalid input(s): LABALBU  Lipid Panel     Component Value Date/Time   CHOL 174 10/12/2014 0630   TRIG 257 (H) 01/20/2015 0228   HDL 43 10/12/2014 0630   CHOLHDL 4.0 10/12/2014 0630   VLDL 26 10/12/2014 0630   LDLCALC 105 (H) 10/12/2014 0630    BNP (last 3 results) No results for input(s): BNP in the last 8760 hours.  HEMOGLOBIN A1C Lab Results  Component Value Date   HGBA1C 5.4  10/11/2014   MPG 108 10/11/2014    Cardiac Panel (last 3 results) No results for input(s): CKTOTAL, CKMB, RELINDX in the last 8760 hours.  Invalid input(s): TROPONINHS  Lab Results  Component Value Date   CKTOTAL 23 (L) 02/03/2019   CKMB 3.8 10/10/2010     TSH No results for input(s): TSH in the last 8760 hours.   Medications Prior to Admission  Medication Sig Dispense Refill   acetaminophen (TYLENOL)  500 MG tablet Take 500-1,000 mg by mouth every 6 (six) hours as needed for moderate pain.     amitriptyline (ELAVIL) 25 MG tablet Take 25 mg by mouth 2 (two) times daily.     amLODipine (NORVASC) 10 MG tablet Take 5 mg by mouth daily.     budesonide-formoterol (SYMBICORT) 160-4.5 MCG/ACT inhaler 2 puff(s)     busPIRone (BUSPAR) 30 MG tablet 1 tab(s)     hydrochlorothiazide (HYDRODIURIL) 25 MG tablet Take 1 tablet by mouth daily.     HYDROcodone-acetaminophen (NORCO/VICODIN) 5-325 MG tablet take one tablet po q 8-12 hours for chronic pain #75/30 day supply     methocarbamol (ROBAXIN) 500 MG tablet Take 1,000 mg by mouth 3 (three) times daily as needed for muscle spasms.     mirtazapine (REMERON) 15 MG tablet Take 15 mg by mouth at bedtime.     Multiple Vitamin (MULTIVITAMIN WITH MINERALS) TABS tablet Take 1 tablet by mouth daily.     QUEtiapine (SEROQUEL) 100 MG tablet Take 100 mg by mouth at bedtime.     rosuvastatin (CRESTOR) 20 MG tablet Take 20 mg by mouth at bedtime.        Current Facility-Administered Medications:    0.9 %  sodium chloride infusion, , Intravenous, Continuous, Prabhleen Montemayor C, PA-C   acetaminophen (TYLENOL) tablet 500-1,000 mg, 500-1,000 mg, Oral, Q6H PRN, Alvine Mostafa C, PA-C   amitriptyline (ELAVIL) tablet 25 mg, 25 mg, Oral, BID, Deliyah Muckle C, PA-C   amLODipine (NORVASC) tablet 5 mg, 5 mg, Oral, Daily, Ander Wamser C, PA-C   Chlorhexidine Gluconate Cloth 2 % PADS 6 each, 6 each, Topical, Daily, Patwardhan, Manish J, MD   docusate  sodium (COLACE) capsule 100 mg, 100 mg, Oral, BID PRN, Berk Pilot C, PA-C   enoxaparin (LOVENOX) injection 40 mg, 40 mg, Subcutaneous, Q24H, Ezel Vallone C, PA-C   hydrochlorothiazide (HYDRODIURIL) tablet 25 mg, 25 mg, Oral, Daily, Ragnar Waas C, PA-C   HYDROcodone-acetaminophen (NORCO/VICODIN) 5-325 MG per tablet 1 tablet, 1 tablet, Oral, Q8H PRN, Makari Sanko C, PA-C   methocarbamol (ROBAXIN) tablet 1,000 mg, 1,000 mg, Oral, TID PRN, Orphia Mctigue C, PA-C   mirtazapine (REMERON) tablet 15 mg, 15 mg, Oral, QHS, Girtie Wiersma C, PA-C   mometasone-formoterol (DULERA) 200-5 MCG/ACT inhaler 2 puff, 2 puff, Inhalation, BID, Coralyn Roselli C, PA-C   polyethylene glycol (MIRALAX / GLYCOLAX) packet 17 g, 17 g, Oral, Daily PRN, Marjani Kobel C, PA-C   QUEtiapine (SEROQUEL) tablet 100 mg, 100 mg, Oral, QHS, Kaamil Morefield C, PA-C   rosuvastatin (CRESTOR) tablet 20 mg, 20 mg, Oral, QHS, Johnell Bas C, PA-C   sodium chloride flush (NS) 0.9 % injection 10-40 mL, 10-40 mL, Intracatheter, Q12H, Patwardhan, Manish J, MD   sodium chloride flush (NS) 0.9 % injection 10-40 mL, 10-40 mL, Intracatheter, PRN, Patwardhan, Manish J, MD   There were no vitals filed for this visit. There is no height or weight on file to calculate BMI.   RADIOLOGY:  Chest X-ray 03/03/2021: Marked cardiomegaly No results found.  CARDIAC STUDIES:  EKG 03/09/2021: Sinus tachycardia 102 bpm Low voltage Poor R wave progression Nonspecific T-abnormality  Echocardiogram 03/12/2021:  1. The left ventricle has normal systolic function with an ejection  fraction of 60-65%. The cavity size was normal. There is mildly increased  left ventricular wall thickness. Left ventricular diastolic Doppler  parameters are consistent with impaired  relaxation. No evidence of left ventricular regional wall motion  abnormalities.   2. The right  ventricle has normal systolic function. The cavity was   normal. There is no increase in right ventricular wall thickness.   3. Small circumerential pericardial effusion without tamponade.   4. No evidence of mitral valve stenosis.   5. The aortic valve is tricuspid. No stenosis of the aortic valve.   6. The aortic root and ascending aorta are normal in size and structure.   7. Normal IVC size. No complete TR doppler jet so unable to estimate PA  systolic pressure. Upon further reviewed Dr. Virgina Jock felt pericardial effusion to be large with tamponade physiology.    Assessment/Plan  55 y.o. Caucasian female  with history of family h/o CAD, personal h/o severe major depression, prior suicide attempts, history of alcohol abuse, h/o COVID in 2021.  Who presented with dyspnea on exertion and outpatient echo revealed large pericardial effusion with tamponade physiology.  She was therefore admitted for elective pericardiocentesis.  Pericardial effusion Suspect pericardial effusion is likely chronic. Patient is hemodynamically stable with no evidence of pulsus paradoxus on exam at this time.  We will continue to monitor closely and plan for pericardiocentesis tomorrow by Dr. Virgina Jock. Brief discussed procedure indications, risks, benefits and patient wishes to proceed with pericardiocentesis. Continue cardiac telemetry Initiate fluids (normal saline) 50 cc/h. Strict I&Os    Dyspnea on exertion  Likely secondary to pericardial effusion.  Suspect this will improve post pericardiocentesis.  However if patient continues to have dyspnea on exertion could consider exercise nuclear stress test in the future to evaluate for underlying ischemia.  History of Covid 19 infection  Patient has had dyspnea on exertion since COVID-19 infection last year, which became worse over the last 2 months.  Patient may have component of long COVID symptoms contributing to dyspnea on exertion as well.  Patient has been vaccinated x3 for COVID-19.   Patient was seen in  collaboration with Dr. Virgina Jock. He also reviewed patient's chart and examined the patient. Dr. Virgina Jock is in agreement of the plan.    Hailey Berthold, PA-C 03/15/2021, 2:03 PM Office: [phone]

## 2021-03-16 ENCOUNTER — Inpatient Hospital Stay (HOSPITAL_COMMUNITY): Payer: Medicare Other

## 2021-03-16 ENCOUNTER — Inpatient Hospital Stay (HOSPITAL_COMMUNITY)
Admission: AD | Disposition: A | Discharge status: Home / Self Care | Payer: Self-pay | Source: Ambulatory Visit | Attending: Cardiology

## 2021-03-16 ENCOUNTER — Other Ambulatory Visit (HOSPITAL_COMMUNITY): Payer: Self-pay

## 2021-03-16 ENCOUNTER — Encounter (HOSPITAL_COMMUNITY): Payer: Self-pay | Admitting: Cardiology

## 2021-03-16 ENCOUNTER — Other Ambulatory Visit: Payer: Self-pay | Admitting: Cardiology

## 2021-03-16 DIAGNOSIS — I3139 Other pericardial effusion (noninflammatory): Secondary | ICD-10-CM

## 2021-03-16 DIAGNOSIS — I313 Pericardial effusion (noninflammatory): Secondary | ICD-10-CM

## 2021-03-16 HISTORY — PX: PERICARDIOCENTESIS: CATH118255

## 2021-03-16 LAB — CBC
HCT: 37.6 % (ref 36.0–46.0)
Hemoglobin: 11.9 g/dL — ABNORMAL LOW (ref 12.0–15.0)
MCH: 29.1 pg (ref 26.0–34.0)
MCHC: 31.6 g/dL (ref 30.0–36.0)
MCV: 91.9 fL (ref 80.0–100.0)
Platelets: 365 10*3/uL (ref 150–400)
RBC: 4.09 MIL/uL (ref 3.87–5.11)
RDW: 15.2 % (ref 11.5–15.5)
WBC: 8.8 10*3/uL (ref 4.0–10.5)
nRBC: 0 % (ref 0.0–0.2)

## 2021-03-16 LAB — ECHOCARDIOGRAM LIMITED
Height: 62 in
S' Lateral: 2.6 cm
Weight: 2588.8 oz

## 2021-03-16 LAB — BASIC METABOLIC PANEL
Anion gap: 9 (ref 5–15)
BUN: 7 mg/dL (ref 6–20)
CO2: 27 mmol/L (ref 22–32)
Calcium: 8.9 mg/dL (ref 8.9–10.3)
Chloride: 103 mmol/L (ref 98–111)
Creatinine, Ser: 0.74 mg/dL (ref 0.44–1.00)
GFR, Estimated: >60 mL/min (ref >60)
Glucose, Bld: 106 mg/dL — ABNORMAL HIGH (ref 70–99)
Potassium: 3.3 mmol/L — ABNORMAL LOW (ref 3.5–5.1)
Sodium: 139 mmol/L (ref 135–145)

## 2021-03-16 LAB — BODY FLUID CELL COUNT WITH DIFFERENTIAL
Eos, Fluid: 0 %
Lymphs, Fluid: 63 %
Monocyte-Macrophage-Serous Fluid: 30 % — ABNORMAL LOW (ref 50–90)
Neutrophil Count, Fluid: 7 % (ref 0–25)
Total Nucleated Cell Count, Fluid: 640 cu mm (ref 0–1000)

## 2021-03-16 LAB — LIPID PANEL
Cholesterol: 75 mg/dL (ref 0–200)
HDL: 25 mg/dL — ABNORMAL LOW (ref >40)
LDL Cholesterol: 27 mg/dL (ref 0–99)
Total CHOL/HDL Ratio: 3 RATIO
Triglycerides: 116 mg/dL (ref <150)
VLDL: 23 mg/dL (ref 0–40)

## 2021-03-16 SURGERY — PERICARDIOCENTESIS
Anesthesia: LOCAL

## 2021-03-16 MED ORDER — ONDANSETRON HCL 4 MG/2ML IJ SOLN
4.0000 mg | Freq: Four times a day (QID) | INTRAMUSCULAR | Status: DC | PRN
  Filled 2021-03-16: qty 2

## 2021-03-16 MED ORDER — SODIUM CHLORIDE 0.9 % IV SOLN: 250.0000 mL | INTRAVENOUS | Status: DC | PRN

## 2021-03-16 MED ORDER — COLCHICINE 0.6 MG PO TABS
0.6000 mg | ORAL_TABLET | Freq: Two times a day (BID) | ORAL | Status: DC
  Administered 2021-03-16 – 2021-03-17 (×4): 0.6 mg via ORAL
  Filled 2021-03-16 (×5): qty 1

## 2021-03-16 MED ORDER — COLCHICINE 0.6 MG PO TABS
0.6000 mg | ORAL_TABLET | Freq: Two times a day (BID) | ORAL | 2 refills | Status: DC
  Filled 2021-03-16: qty 60, 30d supply, fill #0

## 2021-03-16 MED ORDER — MELATONIN 5 MG PO TABS
5.0000 mg | ORAL_TABLET | Freq: Once | ORAL | Status: AC
  Administered 2021-03-16: 5 mg via ORAL
  Filled 2021-03-16: qty 1

## 2021-03-16 MED ORDER — BUPIVACAINE HCL (PF) 0.25 % IJ SOLN
INTRAMUSCULAR | Status: AC
  Filled 2021-03-16: qty 30

## 2021-03-16 MED ORDER — BUPIVACAINE HCL (PF) 0.25 % IJ SOLN
INTRAMUSCULAR | Status: DC | PRN
  Administered 2021-03-16: 10 mL

## 2021-03-16 MED ORDER — PROCAINAMIDE HCL 100 MG/ML IJ SOLN
INTRAMUSCULAR | Status: AC
  Filled 2021-03-16: qty 1

## 2021-03-16 MED ORDER — MIDAZOLAM HCL 2 MG/2ML IJ SOLN
INTRAMUSCULAR | Status: AC
  Filled 2021-03-16: qty 2

## 2021-03-16 MED ORDER — FENTANYL CITRATE (PF) 100 MCG/2ML IJ SOLN
INTRAMUSCULAR | Status: AC
  Filled 2021-03-16: qty 2

## 2021-03-16 MED ORDER — SODIUM CHLORIDE 0.9% FLUSH
3.0000 mL | INTRAVENOUS | Status: DC | PRN
  Administered 2021-03-20: 3 mL via INTRAVENOUS

## 2021-03-16 MED ORDER — POTASSIUM CHLORIDE CRYS ER 20 MEQ PO TBCR
40.0000 meq | EXTENDED_RELEASE_TABLET | Freq: Once | ORAL | Status: AC
  Administered 2021-03-16: 40 meq via ORAL
  Filled 2021-03-16: qty 2

## 2021-03-16 MED ORDER — SODIUM CHLORIDE 0.9% FLUSH: 3.0000 mL | Freq: Two times a day (BID) | INTRAVENOUS | Status: DC

## 2021-03-16 MED ORDER — SODIUM CHLORIDE 0.9% FLUSH
3.0000 mL | Freq: Two times a day (BID) | INTRAVENOUS | Status: DC
  Administered 2021-03-16 – 2021-03-22 (×7): 3 mL via INTRAVENOUS

## 2021-03-16 MED ORDER — ACETAMINOPHEN 325 MG PO TABS
650.0000 mg | ORAL_TABLET | ORAL | Status: DC | PRN
  Administered 2021-03-18: 650 mg via ORAL
  Filled 2021-03-16 (×2): qty 2

## 2021-03-16 MED ORDER — HEPARIN (PORCINE) IN NACL 1000-0.9 UT/500ML-% IV SOLN
INTRAVENOUS | Status: DC | PRN
Start: 2021-03-16 — End: 2021-03-16
  Administered 2021-03-16: 500 mL

## 2021-03-16 MED ORDER — MIDAZOLAM HCL 2 MG/2ML IJ SOLN
INTRAMUSCULAR | Status: DC | PRN
  Administered 2021-03-16 (×4): 1 mg via INTRAVENOUS

## 2021-03-16 MED ORDER — LABETALOL HCL 5 MG/ML IV SOLN: 10.0000 mg | INTRAVENOUS | Status: AC | PRN

## 2021-03-16 MED ORDER — POTASSIUM CHLORIDE 10 MEQ/50ML IV SOLN
10.0000 meq | INTRAVENOUS | Status: DC
  Filled 2021-03-16: qty 50

## 2021-03-16 MED ORDER — SODIUM CHLORIDE 0.9 % IV SOLN: INTRAVENOUS | Status: DC

## 2021-03-16 MED ORDER — HYDRALAZINE HCL 20 MG/ML IJ SOLN: 10.0000 mg | INTRAMUSCULAR | Status: AC | PRN

## 2021-03-16 MED ORDER — FENTANYL CITRATE (PF) 100 MCG/2ML IJ SOLN
INTRAMUSCULAR | Status: DC | PRN
  Administered 2021-03-16 (×3): 25 ug via INTRAVENOUS

## 2021-03-16 MED ORDER — SODIUM CHLORIDE 0.9% FLUSH: 3.0000 mL | INTRAVENOUS | Status: DC | PRN

## 2021-03-16 MED ORDER — HEPARIN (PORCINE) IN NACL 1000-0.9 UT/500ML-% IV SOLN
INTRAVENOUS | Status: AC
  Filled 2021-03-16: qty 500

## 2021-03-16 SURGICAL SUPPLY — 6 items
DRAIN HEMOVAC 1/8 X 5 (WOUND CARE) ×6 IMPLANT
GUIDEWIRE INQWIRE 1.5J.035X260 (WIRE) ×1 IMPLANT
INQWIRE 1.5J .035X260CM (WIRE) ×2
SHEATH PINNACLE 6F 10CM (SHEATH) ×2 IMPLANT
SHEATH PROBE COVER 6X72 (BAG) ×4 IMPLANT
TRAY PERICARDIOCENTESIS 6FX60 (TRAY / TRAY PROCEDURE) ×2 IMPLANT

## 2021-03-16 NOTE — Plan of Care (Signed)

## 2021-03-16 NOTE — Interval H&P Note (Signed)
History and Physical Interval Note:  03/16/2021 8:46 AM  [name]  has presented today for surgery, with the diagnosis of pericardial effusion.  The various methods of treatment have been discussed with the patient and family. After consideration of risks, benefits and other options for treatment, the patient has consented to  Procedure(s): PERICARDIOCENTESIS (N/A) as a surgical intervention.  The patient's history has been reviewed, patient examined, no change in status, stable for surgery.  I have reviewed the patient's chart and labs.  Questions were answered to the patient's satisfaction.     [place]

## 2021-03-16 NOTE — Progress Notes (Signed)
  Echocardiogram 2D Echocardiogram has been performed.  LIMA CHILLEMI 03/16/2021, 10:46 AM

## 2021-03-16 NOTE — Progress Notes (Signed)
Pericardial drain intact, but tubing got disconnected at the bag end. Replaced it with sterile precautions. Will leave the drain in place.  Limited echo in am. If no significant re-accumulation and minimal input overnight, will pull the drain.   Nigel Mormon, MD Pager: [phone] Office: [phone]

## 2021-03-17 ENCOUNTER — Inpatient Hospital Stay (HOSPITAL_COMMUNITY): Payer: Medicare Other

## 2021-03-17 DIAGNOSIS — I5031 Acute diastolic (congestive) heart failure: Secondary | ICD-10-CM

## 2021-03-17 DIAGNOSIS — I313 Pericardial effusion (noninflammatory): Secondary | ICD-10-CM

## 2021-03-17 LAB — PROTEIN, BODY FLUID (OTHER): Total Protein, Body Fluid Other: 5.9 g/dL

## 2021-03-17 LAB — BASIC METABOLIC PANEL
Anion gap: 15 (ref 5–15)
BUN: 7 mg/dL (ref 6–20)
CO2: 24 mmol/L (ref 22–32)
Calcium: 8.7 mg/dL — ABNORMAL LOW (ref 8.9–10.3)
Chloride: 96 mmol/L — ABNORMAL LOW (ref 98–111)
Creatinine, Ser: 0.58 mg/dL (ref 0.44–1.00)
GFR, Estimated: >60 mL/min (ref >60)
Glucose, Bld: 130 mg/dL — ABNORMAL HIGH (ref 70–99)
Potassium: 2.9 mmol/L — ABNORMAL LOW (ref 3.5–5.1)
Sodium: 135 mmol/L (ref 135–145)

## 2021-03-17 LAB — LD, BODY FLUID (OTHER): LD, Body Fluid: 339 IU/L

## 2021-03-17 LAB — GLUCOSE, BODY FLUID OTHER: Glucose, Body Fluid Other: 68 mg/dL

## 2021-03-17 LAB — PH, BODY FLUID: pH, Body Fluid: 7.4

## 2021-03-17 LAB — ECHOCARDIOGRAM LIMITED
Height: 62 in
S' Lateral: 2.4 cm
Weight: 2546.75 oz

## 2021-03-17 MED ORDER — POTASSIUM CHLORIDE CRYS ER 20 MEQ PO TBCR
40.0000 meq | EXTENDED_RELEASE_TABLET | Freq: Once | ORAL | Status: AC
  Administered 2021-03-17: 40 meq via ORAL
  Filled 2021-03-17: qty 2

## 2021-03-17 MED ORDER — FUROSEMIDE 10 MG/ML IJ SOLN
40.0000 mg | Freq: Once | INTRAMUSCULAR | Status: AC
  Administered 2021-03-17: 40 mg via INTRAVENOUS
  Filled 2021-03-17: qty 4

## 2021-03-17 MED ORDER — POTASSIUM CHLORIDE CRYS ER 20 MEQ PO TBCR
40.0000 meq | EXTENDED_RELEASE_TABLET | Freq: Every day | ORAL | Status: DC
  Administered 2021-03-17 – 2021-03-19 (×2): 40 meq via ORAL
  Filled 2021-03-17 (×2): qty 2

## 2021-03-17 MED ORDER — AMLODIPINE BESYLATE 5 MG PO TABS
5.0000 mg | ORAL_TABLET | Freq: Every day | ORAL | Status: DC
  Administered 2021-03-17: 5 mg via ORAL
  Filled 2021-03-17 (×2): qty 1

## 2021-03-17 MED ORDER — MELATONIN 5 MG PO TABS
5.0000 mg | ORAL_TABLET | Freq: Every day | ORAL | Status: DC
  Administered 2021-03-17 – 2021-03-21 (×5): 5 mg via ORAL
  Filled 2021-03-17 (×5): qty 1

## 2021-03-17 MED ORDER — POTASSIUM CHLORIDE CRYS ER 20 MEQ PO TBCR
40.0000 meq | EXTENDED_RELEASE_TABLET | Freq: Once | ORAL | Status: AC
  Filled 2021-03-17: qty 2

## 2021-03-17 MED FILL — Bupivacaine HCl Preservative Free (PF) Inj 0.25%: INTRAMUSCULAR | Qty: 30 | Status: AC

## 2021-03-17 NOTE — Progress Notes (Signed)
  Echocardiogram 2D Echocardiogram has been performed.  [name] 03/17/2021, 11:29 AM

## 2021-03-17 NOTE — Anesthesia Preprocedure Evaluation (Addendum)
Anesthesia Evaluation  Patient identified by MRN, date of birth, ID band Patient awake    Reviewed: Allergy & Precautions, NPO status , Patient's Chart, lab work & pertinent test results  Airway Mallampati: III  TM Distance: >3 FB Neck ROM: Full    Dental no notable dental hx.    Pulmonary Current Smoker and Patient abstained from smoking.,  Post covid lung issues   Pulmonary exam normal breath sounds clear to auscultation       Cardiovascular hypertension, Pt. on medications Normal cardiovascular exam Rhythm:Regular Rate:Normal  ECHO: 1. Left ventricular ejection fraction, by estimation, is 60 to 65%. The left ventricle has normal function.  2. Small circumferential pericardial effusion with mostly clear fluids but loculated stranding on RV free wall. No tamponade.  3. The inferior vena cava is normal in size with <50% respiratory variability, suggesting right atrial pressure of 8 mmHg.  4. Minimal increase in pericardial fluid compared to post  pericardiocentesis echcoardiogram images on 03/16/2021.    Neuro/Psych  Headaches, Seizures -, Well Controlled,  PSYCHIATRIC DISORDERS Anxiety Depression Bipolar Disorder Psychiatric pseudoseizure   GI/Hepatic negative GI ROS, (+)     substance abuse  ,   Endo/Other  hypokalemia  Renal/GU negative Renal ROS     Musculoskeletal  (+) Arthritis ,   Abdominal   Peds  Hematology HLD   Anesthesia Other Findings pericardial window  Reproductive/Obstetrics                            Anesthesia Physical Anesthesia Plan  ASA: 3  Anesthesia Plan: General   Post-op Pain Management:    Induction: Intravenous  PONV Risk Score and Plan: 2 and Ondansetron, Dexamethasone, Midazolam and Treatment may vary due to age or medical condition  Airway Management Planned: Oral ETT  Additional Equipment: Arterial line  Intra-op Plan:   Post-operative Plan:  Extubation in OR  Informed Consent: I have reviewed the patients History and Physical, chart, labs and discussed the procedure including the risks, benefits and alternatives for the proposed anesthesia with the patient or authorized representative who has indicated his/her understanding and acceptance.     Dental advisory given  Plan Discussed with: CRNA  Anesthesia Plan Comments:        Anesthesia Quick Evaluation

## 2021-03-17 NOTE — Consult Note (Addendum)
[address]       [place], [address]             [phone]        Sherrian Pla Drumright Medical Record #[id number] Date of Birth: May 03, 1966  Referring: Dr. Virgina Jock, MD Primary Care: [name], MD Primary Cardiologist:None  Chief Complaint:    Progressive shortness of breath  History of Present Illness:     This is a 55 year old female with a past medical history of severe major depression, alcohol abuse, previous suicide attempts, and COVID in 2021 who presented with worsening shortness of breath the past month. She states she gets short of breath with minimal activity, chest pain with exertion that improves with rest, and right greater than left LE swelling. Patient thought her shortness of breath was related to her previous COVID. She was seen by her PCP, Dr. Vista Lawman. Chest x ray showed cardiomegaly and possible pericardial effusion. Of note, CT of the chest done 02/01/2021 (she had shortness of breath) showed small to moderate pericardial effusion. Also on this admission, it was felt she had acute diastolic heart failure and she responded to Lasix.  Patient was then seen by Dr. Virgina Jock on 03/09/2021. An echo was done which showed LVEF 60-65%, small circumferential pericardial effusion without tamponade. Dr. Virgina Jock tried to direct admit the patient on 06/25 /2022 but for personal reasons, patient requested admission on 03/15/2021. Patient underwent a pericardiocentesis on 06/27 and 1590 cc of sero sanguinous fluid was removed. Follow up 2D echo was done 06/27 and showed LVEF 65-70% and a large circumferential pericardial effusion was decreased to trivial pericardial effusion. Repeat 2 D echo done today LVEF 60-65% and a small circumferential pericardial effusion and no tamponade. Consultation was requested with Dr. Kipp Brood for consideration of pericardial window. Of note, patient is on colchicine and Lasix. Cytology results from  pericardiocentesis are pending and culture from fluid shows no growth for 24 hours.  Current Activity/ Functional Status: Patient is independent with mobility/ambulation, transfers, ADL's, IADL's.   Zubrod Score: At the time of surgery this patient's most appropriate activity status/level should be described as: []     0    Normal activity, no symptoms [x]     1    Restricted in physical strenuous activity but ambulatory, able to do out light work. 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 Per patient, sober over 10 years   Migraines    PICC (peripherally inserted central catheter) in place    rt neck   Psychiatric pseudoseizure    Seizures ([place])  Past Surgical History:  Procedure Laterality Date   ABDOMINAL HYSTERECTOMY     ABDOMINAL SURGERY     APPENDECTOMY     CARPAL TUNNEL RELEASE     2010   CHOLECYSTECTOMY     ESOPHAGOGASTRODUODENOSCOPY N/A 10/06/2014   Procedure: ESOPHAGOGASTRODUODENOSCOPY (EGD);  Surgeon: Inda Castle, MD;  Location: [facility];  Service: Endoscopy;  Laterality: N/A;   KNEE SURGERY     laproscopy     NASAL SINUS SURGERY     PERICARDIOCENTESIS N/A 03/16/2021   Procedure: PERICARDIOCENTESIS;  Surgeon: Nigel Mormon, MD;  Location: [facility];  Service: Cardiovascular;  Laterality: N/A;    Social History   Tobacco Use  Smoking Status Every Day   Packs/day: 0.15   Years: 20.00   Pack years: 3.00   Types: Cigarettes   Smokeless Tobacco Never   Per patient recently quit cigarettes  Social History   Substance and Sexual Activity  Alcohol Use Not Currently   Family History: Father deceased at age 17 from MI Mother deceased at age 27, had dementia   Allergies  Allergen Reactions   Aspirin Anaphylaxis   Coconut Oil Rash   Doxycycline Anaphylaxis and Other (See Comments)    Joint damage also   Gabapentin     Other reaction(s): Abdominal Pain, GI Bleed   Imitrex [Sumatriptan Base] Other (See Comments)    Severe hypertension, all triptans   Iodinated Diagnostic Agents Anaphylaxis   Lactose Diarrhea    Other reaction(s): GI Upset (intolerance)   Lidocaine Anaphylaxis    Pt states she does well with Marcaine/Bupivicaine without problem   Prednisone Other (See Comments)    She goes crazy   Sulfa Antibiotics Anaphylaxis   Sulfasalazine Anaphylaxis   Sumatriptan Other (See Comments)    Felt like she was "having a stroke", heart rate and blood pressure "went through the roof"   Tramadol Other (See Comments)    seizures   Levofloxacin Other (See Comments)    Joints hurt   Amoxicillin-Pot Clavulanate Other (See Comments)    Pt can't remember what type of reaction she had, but states her pharmacy has it listed in their allergy list.   Caffeine-Sodium Benzoate     Other reaction(s): Dizziness (intolerance)   Other Other (See Comments)    Artificial sugar - migraine   Prochlorperazine Other (See Comments)    Tonic/clonic seizure   Latex Rash   Levofloxacin Rash and Other (See Comments)    Joints hurt   Metrizamide Other (See Comments)   Nsaids Rash and Other (See Comments)    GI bleed   Tolmetin Rash    Current Facility-Administered Medications  Medication Dose Route Frequency Provider Last Rate Last Admin   0.9 %  sodium chloride infusion  250 mL Intravenous PRN Patwardhan, Manish J, MD       acetaminophen (TYLENOL) tablet 650 mg  650 mg Oral Q4H PRN Patwardhan, Manish J, MD        amitriptyline (ELAVIL) tablet 50 mg  50 mg Oral QHS Patwardhan, Manish J, MD   50 mg at 03/16/21 2104   amLODipine (NORVASC) tablet 5 mg  5 mg Oral Daily Patwardhan, Manish J, MD   5 mg at 03/17/21 0944   busPIRone (BUSPAR) tablet 30 mg  30 mg Oral BID Patwardhan, Manish J, MD   30 mg at 03/17/21 0998   Chlorhexidine Gluconate Cloth 2 % PADS 6 each  6 each Topical Daily Patwardhan, Reynold Bowen, MD   6 each  at 03/17/21 0940   colchicine tablet 0.6 mg  0.6 mg Oral BID Nigel Mormon, MD   0.6 mg at 03/17/21 1025   docusate sodium (COLACE) capsule 100 mg  100 mg Oral BID PRN Cantwell, Celeste C, PA-C       hydrochlorothiazide (HYDRODIURIL) tablet 25 mg  25 mg Oral Daily Cantwell, Celeste C, PA-C   25 mg at 03/17/21 8527   HYDROcodone-acetaminophen (NORCO/VICODIN) 5-325 MG per tablet 1 tablet  1 tablet Oral Q4H PRN Nigel Mormon, MD   1 tablet at 03/17/21 1201   methocarbamol (ROBAXIN) tablet 1,000 mg  1,000 mg Oral TID PRN Cantwell, Celeste C, PA-C   1,000 mg at 03/17/21 0759   mirtazapine (REMERON) tablet 15 mg  15 mg Oral QHS Cantwell, Celeste C, PA-C   15 mg at 03/16/21 2104   mometasone-formoterol (DULERA) 200-5 MCG/ACT inhaler 2 puff  2 puff Inhalation BID Cantwell, Celeste C, PA-C   2 puff at 03/17/21 0805   ondansetron (ZOFRAN) injection 4 mg  4 mg Intravenous Q6H PRN Patwardhan, Manish J, MD       polyethylene glycol (MIRALAX / GLYCOLAX) packet 17 g  17 g Oral Daily PRN Cantwell, Celeste C, PA-C       potassium chloride SA (KLOR-CON) CR tablet 40 mEq  40 mEq Oral Once Lyndee Leo, RPH       potassium chloride SA (KLOR-CON) CR tablet 40 mEq  40 mEq Oral Daily Patwardhan, Manish J, MD   40 mEq at 03/17/21 1344   QUEtiapine (SEROQUEL) tablet 100 mg  100 mg Oral QHS Cantwell, Celeste C, PA-C   100 mg at 03/16/21 2104   rosuvastatin (CRESTOR) tablet 20 mg  20 mg Oral QHS Cantwell, Celeste C, PA-C   20 mg at 03/16/21 2104   sodium chloride flush (NS) 0.9 % injection 10-40 mL  10-40 mL  Intracatheter Q12H Patwardhan, Manish J, MD   10 mL at 03/17/21 0942   sodium chloride flush (NS) 0.9 % injection 10-40 mL  10-40 mL Intracatheter PRN Patwardhan, Manish J, MD       sodium chloride flush (NS) 0.9 % injection 3 mL  3 mL Intravenous Q12H Patwardhan, Manish J, MD   3 mL at 03/17/21 0942   sodium chloride flush (NS) 0.9 % injection 3 mL  3 mL Intravenous PRN Patwardhan, Manish J, MD       topiramate (TOPAMAX) tablet 25 mg  25 mg Oral Daily Patwardhan, Manish J, MD   25 mg at 03/17/21 7824    Medications Prior to Admission  Medication Sig Dispense Refill Last Dose   acetaminophen (TYLENOL) 500 MG tablet Take 500-1,000 mg by mouth every 6 (six) hours as needed for moderate pain.   Past Week   albuterol (VENTOLIN HFA) 108 (90 Base) MCG/ACT inhaler Inhale 2 puffs into the lungs every 6 (six) hours as needed for wheezing or shortness of breath.   Past Week   amitriptyline (ELAVIL) 25 MG tablet Take 50 mg by mouth at bedtime.   03/14/2021   amLODipine (NORVASC) 10 MG tablet Take 5 mg by mouth daily.   03/15/2021   budesonide-formoterol (SYMBICORT) 160-4.5 MCG/ACT inhaler Inhale 2 puffs into the lungs daily as needed (wheezing & SOB).   03/15/2021   busPIRone (BUSPAR) 30 MG tablet Take 30 mg by mouth 2 (two) times daily.   03/15/2021   diclofenac Sodium (VOLTAREN) 1 % GEL Apply 2 g topically 4 (four) times daily as needed (pain).  03/12/2021   hydrochlorothiazide (HYDRODIURIL) 25 MG tablet Take 25 mg by mouth daily.   03/15/2021   HYDROcodone-acetaminophen (NORCO/VICODIN) 5-325 MG tablet Take 1 tablet by mouth every 4 (four) hours as needed for moderate pain.   03/15/2021   methocarbamol (ROBAXIN) 500 MG tablet Take 1,000 mg by mouth 3 (three) times daily as needed for muscle spasms.   03/15/2021   mirtazapine (REMERON) 15 MG tablet Take 15 mg by mouth at bedtime.   03/14/2021   Multiple Vitamin (MULTIVITAMIN WITH MINERALS) TABS tablet Take 1 tablet by mouth daily.   03/15/2021   ondansetron  (ZOFRAN-ODT) 4 MG disintegrating tablet Take 4 mg by mouth every 8 (eight) hours as needed for nausea or vomiting.   03/13/2021   QUEtiapine (SEROQUEL) 100 MG tablet Take 100 mg by mouth at bedtime.   03/14/2021   rosuvastatin (CRESTOR) 20 MG tablet Take 20 mg by mouth at bedtime.   03/14/2021   topiramate (TOPAMAX) 25 MG tablet Take 25 mg by mouth daily.   03/14/2021    Family History  Problem Relation Age of Onset   Hypertension Mother    Hyperlipidemia Mother    Heart failure Father    Heart attack Father    Cancer Other    Review of Systems:   ROS    Cardiac Review of Systems: Y or  [ N ]= no  Chest Pain [ Y-with exertion and at admission] Exertional SOB  [  Y]     Lower Extremity Edema [  Y ]   Syncope  [  N]  Presyncope [  N ]  General Review of Systems: [Y] = yes [  ]N=no Constitional:anorexia [ Y ];nausea [ N ]; night sweats [  N]; fever [  N;                               Eye : Amaurosis fugax[ N ]; Resp: cough [ Just developed a dry cough ];  wheezing[  N];  hemoptysis[  N];  GI:  vomiting[  N];               Heme/Lymph:   anemia[ N ];  Neuro: TIA[  N];  stroke[ N ];  seizures[  N];     Psych:depression[ Y];   Endocrine: diabetes[  N];  t                Physical Exam: BP 122/79   Pulse 98   Temp 98.6 F (37 C)   Resp 15   Ht 5\' 2"  (1.575 m)   Wt 72.2 kg   SpO2 94%   BMI 29.11 kg/m    General appearance: alert, cooperative, and no distress Head: Normocephalic, without obvious abnormality, atraumatic Neck: supple, symmetrical, trachea midline Resp: clear to auscultation bilaterally Cardio: RRR Extremities: No LE edema. Pulses intact bilaterally Neurologic: Grossly normal  Diagnostic Studies & Laboratory data:     Recent Radiology Findings:   DG Chest 1 View  Result Date: 03/16/2021 CLINICAL DATA:  Pericardial effusion. EXAM: CHEST  1 VIEW COMPARISON:  March 15, 2021. FINDINGS: Cardiomediastinal silhouette is significantly smaller status post pericardial  drainage. Right internal jugular Port-A-Cath is unchanged in position. No pneumothorax is noted. Mild bibasilar subsegmental atelectasis is noted. Bony thorax is unremarkable. IMPRESSION: Cardiomediastinal silhouette is significantly smaller status post pericardial drainage. Electronically Signed   By: Marijo Conception M.D.   On: 03/16/2021 11:32  DG Chest 1 View  Result Date: 03/15/2021 CLINICAL DATA:  Chest pain, shortness of breath EXAM: CHEST  1 VIEW COMPARISON:  02/02/2019 FINDINGS: Right Port-A-Cath remains in place, unchanged. Marked enlargement of the cardiopericardial silhouette. No confluent opacities, effusions or edema. IMPRESSION: Marked enlargement of the cardiopericardial silhouette. Electronically Signed   By: Rolm Baptise M.D.   On: 03/15/2021 19:14   CARDIAC CATHETERIZATION  Result Date: 03/16/2021 Images from the original result were not included. Successful pericardiocentesis 1590 cc serosanguinous fluid Nigel Mormon, MD Pager: [phone] Office: [phone]   ECHOCARDIOGRAM LIMITED  Result Date: 03/17/2021    ECHOCARDIOGRAM LIMITED REPORT   Patient Name:   CLARE FENNIMORE Date of Exam: 03/17/2021 Medical Rec #:  [id number]  Height:       62.0 in Accession #:    [id number] Weight:       159.2 lb Date of Birth:  1966-02-03 BSA:          1.735 m Patient Age:    40 years   BP:           92/62 mmHg Patient Gender: F          HR:           93 bpm. Exam Location:  Inpatient Procedure: Limited Echo Indications:     Pericardial effusion  History:         Patient has prior history of Echocardiogram examinations, most                  recent 03/16/2021. Pericardial Disease, Signs/Symptoms:Dyspnea;                  Risk Factors:Current Smoker. Covid+, ETOH abuse.  Sonographer:     Dustin Flock RDCS Referring Phys:  [id number] [facility] J PATWARDHAN Diagnosing Phys: Vernell Leep MD IMPRESSIONS  1. Left ventricular ejection fraction, by estimation, is 60 to 65%. The left ventricle has  normal function.  2. Small circumferential pericardial effusion with mostly clear fluids but loculated stranding on RV free wall. No tamponade.  3. The inferior vena cava is normal in size with <50% respiratory variability, suggesting right atrial pressure of 8 mmHg.  4. Minimal increase in pericardial fluid compared to post pericardiocentesis echcoardiogram images on 03/16/2021. FINDINGS  Left Ventricle: Left ventricular ejection fraction, by estimation, is 60 to 65%. The left ventricle has normal function. Pericardium: Small circumferential pericardial effusion with mostly clear fluids but loculated stranding on RV free wall. No tamponade. Venous: The inferior vena cava is normal in size with less than 50% respiratory variability, suggesting right atrial pressure of 8 mmHg. LEFT VENTRICLE PLAX 2D LVIDd:         3.60 cm LVIDs:         2.40 cm LV PW:         0.80 cm LV IVS:        0.80 cm  Vernell Leep MD Electronically signed by Vernell Leep MD Signature Date/Time: 03/17/2021/12:10:21 PM    Final    ECHOCARDIOGRAM LIMITED  Result Date: 03/16/2021    ECHOCARDIOGRAM LIMITED REPORT   Patient Name:   IVORIE UPLINGER Date of Exam: 03/16/2021 Medical Rec #:  [id number]  Height:       62.0 in Accession #:    [id number] Weight:       165.3 lb Date of Birth:  11/05/1965 BSA:          1.763 m Patient Age:    61 years   BP:  116/72 mmHg Patient Gender: F          HR:           86 bpm. Exam Location:  Inpatient Procedure: Limited Echo, Cardiac Doppler, Limited Color Doppler and Saline            Contrast Bubble Study Indications:     Pericardial effusion I31.3  History:         Patient has prior history of Echocardiogram examinations, most                  recent 03/14/2021. Risk Factors:ETOH.  Sonographer:     Bernadene Person RDCS Referring Phys:  [id number] [facility] J PATWARDHAN Diagnosing Phys: Vernell Leep MD IMPRESSIONS  1. Left ventricular ejection fraction, by estimation, is 65 to 70%. The left ventricle  has normal function. The left ventricle has no regional wall motion abnormalities. Left ventricular diastolic function could not be evaluated.  2. Right ventricular systolic function is normal. The right ventricular size is normal. There is mildly elevated pulmonary artery systolic pressure.  3. Large circumferential pericardial effusion, reduced to trivial after pericardiocentesis. No hemodynamic compromise seen after pericardiocetnesis.  4. The mitral valve is grossly normal. No evidence of mitral valve regurgitation.  5. Tricuspid valve regurgitation is moderate.  6. The inferior vena cava is normal in size with greater than 50% respiratory variability, suggesting right atrial pressure of 3 mmHg. FINDINGS  Left Ventricle: Left ventricular ejection fraction, by estimation, is 65 to 70%. The left ventricle has normal function. The left ventricle has no regional wall motion abnormalities. There is no left ventricular hypertrophy. Left ventricular diastolic function could not be evaluated. Right Ventricle: The right ventricular size is normal. Right ventricular systolic function is normal. There is mildly elevated pulmonary artery systolic pressure. The tricuspid regurgitant velocity is 2.74 m/s, and with an assumed right atrial pressure of 3 mmHg, the estimated right ventricular systolic pressure is 94.4 mmHg. Left Atrium: Left atrial size was normal in size. Right Atrium: Right atrial size was normal in size. Pericardium: Large circumferential pericardial effusion, reduced to trivial after pericardiocentesis. No hemodynamic compromise seen after pericardiocetnesis. Mitral Valve: The mitral valve is grossly normal. Tricuspid Valve: The tricuspid valve is grossly normal. Tricuspid valve regurgitation is moderate. Aorta: The aortic root is normal in size and structure. Venous: The inferior vena cava is normal in size with greater than 50% respiratory variability, suggesting right atrial pressure of 3 mmHg. IAS/Shunts:  No atrial level shunt detected by color flow Doppler. LEFT VENTRICLE PLAX 2D LVIDd:         4.20 cm LVIDs:         2.60 cm LV PW:         0.90 cm LV IVS:        0.90 cm LVOT diam:     1.90 cm LVOT Area:     2.84 cm  LEFT ATRIUM         Index LA diam:    3.00 cm 1.70 cm/m   AORTA Ao Root diam: 3.10 cm TRICUSPID VALVE TR Peak grad:   30.0 mmHg TR Vmax:        274.00 cm/s  SHUNTS Systemic Diam: 1.90 cm Vernell Leep MD Electronically signed by Vernell Leep MD Signature Date/Time: 03/16/2021/11:30:31 AM    Final      I have independently reviewed the above radiologic studies and discussed with the patient   Recent Lab Findings: Lab Results  Component Value Date  WBC 8.8 03/16/2021   HGB 11.9 (L) 03/16/2021   HCT 37.6 03/16/2021   PLT 365 03/16/2021   GLUCOSE 130 (H) 03/17/2021   CHOL 75 03/16/2021   TRIG 116 03/16/2021   HDL 25 (L) 03/16/2021   LDLCALC 27 03/16/2021   ALT 34 02/03/2019   AST 18 02/03/2019   NA 135 03/17/2021   K 2.9 (L) 03/17/2021   CL 96 (L) 03/17/2021   CREATININE 0.58 03/17/2021   BUN 7 03/17/2021   CO2 24 03/17/2021   TSH 4.433 01/30/2019   INR 0.89 01/23/2015   HGBA1C 5.4 10/11/2014   Assessment / Plan:   Pericardial effusion-s/p pericardiocentesis 03/16/2021. As appears pericardial fluid is beginning to re accumulate would benefit from a pericardial window. Dr. Kipp Brood to evaluate. As for etiology of above, may be viral but await ANA and Rf;   I  spent 15 minutes counseling the patient face to face.   Lars Pinks PA-C 03/17/2021 2:16 PM  Agree with above. 55 year old female with multiple medical problems who presents with a large pericardial effusion.  She is undergone pericardiocentesis with release of 1.6 L of serosanguineous fluid.  Over the next day she continued to have 200 mL of output.  We have been consulted to assess the need for pericardial window.  She is tentatively scheduled for a right VATS pericardial window.  Deane Melick Bary Leriche

## 2021-03-17 NOTE — Progress Notes (Addendum)
Subjective:  No pain Breathing still labored  Objective:  Vital Signs in the last 24 hours: Temp:  [97.7 F (36.5 C)-99 F (37.2 C)] 98.6 F (37 C) (06/28 0808) Pulse Rate:  [93-103] 98 (06/28 0400) Resp:  [15-38] 15 (06/28 1144) BP: (92-150)/(61-79) 122/79 (06/28 1144) SpO2:  [89 %-97 %] 94 % (06/28 1144) Weight:  [72.2 kg] 72.2 kg (06/28 0454)  Intake/Output from previous day: 06/27 0701 - 06/28 0700 In: 1168.2 [P.O.:120; I.V.:1048.2] Out: 280 [Urine:700; Drains:235]  Physical Exam Vitals and nursing note reviewed.  Constitutional:      General: She is not in acute distress.    Appearance: She is well-developed.  HENT:     Head: Normocephalic and atraumatic.  Eyes:     Conjunctiva/sclera: Conjunctivae normal.     Pupils: Pupils are equal, round, and reactive to light.  Neck:     Vascular: No JVD.  Cardiovascular:     Rate and Rhythm: Regular rhythm. Tachycardia present.     Pulses: Normal pulses and intact distal pulses.     Heart sounds: No murmur heard. Pulmonary:     Effort: Pulmonary effort is normal.     Breath sounds: Normal breath sounds. No wheezing or rales.  Abdominal:     General: Bowel sounds are normal.     Palpations: Abdomen is soft.     Tenderness: There is no rebound.  Musculoskeletal:        General: No tenderness. Normal range of motion.     Right lower leg: No edema.     Left lower leg: No edema.  Lymphadenopathy:     Cervical: No cervical adenopathy.  Skin:    General: Skin is warm and dry.  Neurological:     Mental Status: She is alert and oriented to person, place, and time.     Cranial Nerves: No cranial nerve deficit.     Lab Results: BMP Recent Labs    03/15/21 1429 03/16/21 0525 03/17/21 0820  NA 138 139 135  K 2.8* 3.3* 2.9*  CL 100 103 96*  CO2 29 27 24   GLUCOSE 73 106* 130*  BUN 9 7 7   CREATININE 0.64 0.74 0.58  CALCIUM 9.3 8.9 8.7*  GFRNONAA >60 >60 >60    CBC Recent Labs  Lab 03/16/21 0525  WBC 8.8  RBC  4.09  HGB 11.9*  HCT 37.6  PLT 365  MCV 91.9  MCH 29.1  MCHC 31.6  RDW 15.2    HEMOGLOBIN A1C Lab Results  Component Value Date   HGBA1C 5.4 10/11/2014   MPG 108 10/11/2014    Cardiac Panel (last 3 results) No results for input(s): CKTOTAL, CKMB, TROPONINI, RELINDX in the last 8760 hours.  BNP (last 3 results) No results for input(s): BNP in the last 8760 hours.  TSH No results for input(s): TSH in the last 8760 hours.  Lipid Panel     Component Value Date/Time   CHOL 75 03/16/2021 0525   TRIG 116 03/16/2021 0525   HDL 25 (L) 03/16/2021 0525   CHOLHDL 3.0 03/16/2021 0525   VLDL 23 03/16/2021 0525   LDLCALC 27 03/16/2021 0525     Imaging: CXR 03/16/2021: Cardiomediastinal silhouette is significantly smaller status post pericardial drainage.  Cardiac Studies: Echocardiogram 03/17/2021:  1. Left ventricular ejection fraction, by estimation, is 60 to 65%. The  left ventricle has normal function.   2. Small circumferential pericardial effusion with mostly clear fluids  but loculated stranding on RV free wall. No tamponade.  3. The inferior vena cava is normal in size with <50% respiratory  variability, suggesting right atrial pressure of 8 mmHg.   4. Minimal increase in pericardial fluid compared to post  pericardiocentesis echcoardiogram images on 03/16/2021.   Echocardiogram 03/16/2021:  1. Left ventricular ejection fraction, by estimation, is 65 to 70%. The  left ventricle has normal function. The left ventricle has no regional  wall motion abnormalities. Left ventricular diastolic function could not  be evaluated.   2. Right ventricular systolic function is normal. The right ventricular  size is normal. There is mildly elevated pulmonary artery systolic  pressure.   3. Large circumferential pericardial effusion, reduced to trivial after  pericardiocentesis. No hemodynamic compromise seen after  pericardiocetnesis.   4. The mitral valve is grossly normal.  No evidence of mitral valve  regurgitation.   5. Tricuspid valve regurgitation is moderate.   6. The inferior vena cava is normal in size with greater than 50%  respiratory variability, suggesting right atrial pressure of 3 mmHg.   Assessment & Recommendations:  55 y.o. Caucasian female with history of family h/o CAD, personal h/o severe major depression, prior suicide attempts, history of alcohol abuse, h/o COVID (2021), recurrent pericardial effusion  Pericardial effusion: S/p pericardiocentesis (6/27) w/1.6 L serosangiunous transudative fluid. About a liter sent to cytology, not resulted yet. Drain output 245 cc/24 hrs Has small sized re-accumulation on echo this morning. She remains mildly tachycardic, but BP stable. Will consult CT surgery for pericardial window Recommend colchicine 0.6 mg bid to reduce recurrence. IV fluids stopped. Gave 40 mg lasix today.  Hypokalemia: K 2.9, even before lasix. This is chronic and recurrent. Etiology hyperaldosteronism vs RTA Outpatient workup. K replacement for now.   CRITICAL CARE Performed by: Vernell Leep   Total critical care time: 35 minutes   Critical care time was exclusive of separately billable procedures and treating other patients.   Critical care was necessary to treat or prevent imminent or life-threatening deterioration.   Critical care was time spent personally by me on the following activities: development of treatment plan with patient and/or surrogate as well as nursing, discussions with consultants, evaluation of patient's response to treatment, examination of patient, obtaining history from patient or surrogate, ordering and performing treatments and interventions, ordering and review of laboratory studies, ordering and review of radiographic studies, pulse oximetry and re-evaluation of patient's condition.      Nigel Mormon, MD Pager: [phone] Office: [phone]

## 2021-03-17 NOTE — Plan of Care (Signed)

## 2021-03-17 NOTE — Progress Notes (Signed)
Consent for thorcoscopy signed by patient and MD and placed in patient's chart.

## 2021-03-18 ENCOUNTER — Encounter (HOSPITAL_COMMUNITY)
Admission: AD | Disposition: A | Discharge status: Home / Self Care | Payer: Self-pay | Source: Ambulatory Visit | Attending: Cardiology

## 2021-03-18 ENCOUNTER — Inpatient Hospital Stay (HOSPITAL_COMMUNITY): Payer: Medicare Other | Admitting: Registered Nurse

## 2021-03-18 ENCOUNTER — Other Ambulatory Visit (HOSPITAL_COMMUNITY): Payer: Self-pay

## 2021-03-18 ENCOUNTER — Inpatient Hospital Stay (HOSPITAL_COMMUNITY): Payer: Medicare Other

## 2021-03-18 DIAGNOSIS — I313 Pericardial effusion (noninflammatory): Secondary | ICD-10-CM

## 2021-03-18 HISTORY — PX: VIDEO ASSISTED THORACOSCOPY: SHX5073

## 2021-03-18 LAB — BASIC METABOLIC PANEL
Anion gap: 8 (ref 5–15)
BUN: 9 mg/dL (ref 6–20)
CO2: 26 mmol/L (ref 22–32)
Calcium: 8.9 mg/dL (ref 8.9–10.3)
Chloride: 101 mmol/L (ref 98–111)
Creatinine, Ser: 0.83 mg/dL (ref 0.44–1.00)
GFR, Estimated: >60 mL/min (ref >60)
Glucose, Bld: 147 mg/dL — ABNORMAL HIGH (ref 70–99)
Potassium: 3.2 mmol/L — ABNORMAL LOW (ref 3.5–5.1)
Sodium: 135 mmol/L (ref 135–145)

## 2021-03-18 LAB — MAGNESIUM: Magnesium: 2.4 mg/dL (ref 1.7–2.4)

## 2021-03-18 LAB — CYTOLOGY - NON PAP

## 2021-03-18 SURGERY — VIDEO ASSISTED THORACOSCOPY
Anesthesia: General | Laterality: Right

## 2021-03-18 MED ORDER — MIDAZOLAM HCL 5 MG/5ML IJ SOLN
INTRAMUSCULAR | Status: DC | PRN
  Administered 2021-03-18: 2 mg via INTRAVENOUS

## 2021-03-18 MED ORDER — FENTANYL CITRATE (PF) 100 MCG/2ML IJ SOLN
INTRAMUSCULAR | Status: AC
  Administered 2021-03-18: 25 ug via INTRAVENOUS
  Filled 2021-03-18: qty 2

## 2021-03-18 MED ORDER — FENTANYL CITRATE (PF) 100 MCG/2ML IJ SOLN
12.5000 ug | INTRAMUSCULAR | Status: DC | PRN
  Administered 2021-03-18 – 2021-03-21 (×22): 25 ug via INTRAVENOUS
  Administered 2021-03-21: 12.5 ug via INTRAVENOUS
  Administered 2021-03-21 – 2021-03-22 (×5): 25 ug via INTRAVENOUS
  Filled 2021-03-18 (×29): qty 2

## 2021-03-18 MED ORDER — VANCOMYCIN HCL 1000 MG/200ML IV SOLN
1000.0000 mg | Freq: Two times a day (BID) | INTRAVENOUS | Status: AC
  Administered 2021-03-18: 1000 mg via INTRAVENOUS
  Filled 2021-03-18: qty 200

## 2021-03-18 MED ORDER — BISACODYL 5 MG PO TBEC
10.0000 mg | DELAYED_RELEASE_TABLET | Freq: Every day | ORAL | Status: DC
  Filled 2021-03-18: qty 2

## 2021-03-18 MED ORDER — DEXMEDETOMIDINE (PRECEDEX) IN NS 20 MCG/5ML (4 MCG/ML) IV SYRINGE
PREFILLED_SYRINGE | INTRAVENOUS | Status: AC
  Filled 2021-03-18: qty 5

## 2021-03-18 MED ORDER — LIDOCAINE 2% (20 MG/ML) 5 ML SYRINGE
INTRAMUSCULAR | Status: AC
  Filled 2021-03-18: qty 5

## 2021-03-18 MED ORDER — ROCURONIUM BROMIDE 10 MG/ML (PF) SYRINGE
PREFILLED_SYRINGE | INTRAVENOUS | Status: DC | PRN
  Administered 2021-03-18: 70 mg via INTRAVENOUS

## 2021-03-18 MED ORDER — 0.9 % SODIUM CHLORIDE (POUR BTL) OPTIME
TOPICAL | Status: DC | PRN
  Administered 2021-03-18: 2000 mL

## 2021-03-18 MED ORDER — BUPIVACAINE LIPOSOME 1.3 % IJ SUSP
INTRAMUSCULAR | Status: DC | PRN
  Administered 2021-03-18: 50 mL

## 2021-03-18 MED ORDER — TOPIRAMATE 25 MG PO TABS
25.0000 mg | ORAL_TABLET | Freq: Every day | ORAL | Status: DC
  Administered 2021-03-18 – 2021-03-22 (×5): 25 mg via ORAL
  Filled 2021-03-18 (×5): qty 1

## 2021-03-18 MED ORDER — FENTANYL CITRATE (PF) 250 MCG/5ML IJ SOLN
INTRAMUSCULAR | Status: AC
  Filled 2021-03-18: qty 5

## 2021-03-18 MED ORDER — ROCURONIUM BROMIDE 10 MG/ML (PF) SYRINGE
PREFILLED_SYRINGE | INTRAVENOUS | Status: AC
  Filled 2021-03-18: qty 10

## 2021-03-18 MED ORDER — ACETAMINOPHEN 10 MG/ML IV SOLN: 1000.0000 mg | Freq: Once | INTRAVENOUS | Status: DC | PRN

## 2021-03-18 MED ORDER — PHENYLEPHRINE HCL-NACL 10-0.9 MG/250ML-% IV SOLN
INTRAVENOUS | Status: DC | PRN
  Administered 2021-03-18: 25 ug/min via INTRAVENOUS

## 2021-03-18 MED ORDER — SODIUM CHLORIDE (PF) 0.9 % IJ SOLN
INTRAMUSCULAR | Status: AC
  Filled 2021-03-18: qty 10

## 2021-03-18 MED ORDER — BUPIVACAINE LIPOSOME 1.3 % IJ SUSP
INTRAMUSCULAR | Status: AC
  Filled 2021-03-18: qty 20

## 2021-03-18 MED ORDER — LACTATED RINGERS IV SOLN: INTRAVENOUS | Status: DC | PRN

## 2021-03-18 MED ORDER — SUGAMMADEX SODIUM 200 MG/2ML IV SOLN
INTRAVENOUS | Status: DC | PRN
  Administered 2021-03-18: 200 mg via INTRAVENOUS

## 2021-03-18 MED ORDER — FENTANYL CITRATE (PF) 100 MCG/2ML IJ SOLN
25.0000 ug | INTRAMUSCULAR | Status: DC | PRN
  Administered 2021-03-18: 50 ug via INTRAVENOUS

## 2021-03-18 MED ORDER — DEXAMETHASONE SODIUM PHOSPHATE 10 MG/ML IJ SOLN
INTRAMUSCULAR | Status: AC
  Filled 2021-03-18: qty 1

## 2021-03-18 MED ORDER — POTASSIUM CHLORIDE CRYS ER 20 MEQ PO TBCR
40.0000 meq | EXTENDED_RELEASE_TABLET | Freq: Four times a day (QID) | ORAL | Status: AC
  Administered 2021-03-18 (×2): 40 meq via ORAL
  Filled 2021-03-18: qty 2

## 2021-03-18 MED ORDER — CEFAZOLIN SODIUM-DEXTROSE 2-4 GM/100ML-% IV SOLN
2.0000 g | Freq: Once | INTRAVENOUS | Status: AC
  Administered 2021-03-18: 2 g via INTRAVENOUS
  Filled 2021-03-18: qty 100

## 2021-03-18 MED ORDER — AMLODIPINE BESYLATE 5 MG PO TABS
5.0000 mg | ORAL_TABLET | Freq: Every day | ORAL | Status: DC
  Administered 2021-03-18 – 2021-03-22 (×4): 5 mg via ORAL
  Filled 2021-03-18 (×4): qty 1

## 2021-03-18 MED ORDER — SENNOSIDES-DOCUSATE SODIUM 8.6-50 MG PO TABS
1.0000 | ORAL_TABLET | Freq: Every day | ORAL | Status: DC
  Administered 2021-03-18 – 2021-03-21 (×4): 1 via ORAL
  Filled 2021-03-18 (×4): qty 1

## 2021-03-18 MED ORDER — CEFAZOLIN SODIUM 1 G IJ SOLR
INTRAMUSCULAR | Status: AC
  Filled 2021-03-18: qty 20

## 2021-03-18 MED ORDER — POTASSIUM CHLORIDE CRYS ER 20 MEQ PO TBCR: 40.0000 meq | EXTENDED_RELEASE_TABLET | Freq: Four times a day (QID) | ORAL | Status: DC

## 2021-03-18 MED ORDER — BISACODYL 5 MG PO TBEC
10.0000 mg | DELAYED_RELEASE_TABLET | Freq: Every day | ORAL | Status: DC
  Administered 2021-03-18 – 2021-03-22 (×5): 10 mg via ORAL
  Filled 2021-03-18 (×4): qty 2

## 2021-03-18 MED ORDER — PROPOFOL 10 MG/ML IV BOLUS
INTRAVENOUS | Status: DC | PRN
  Administered 2021-03-18: 130 mg via INTRAVENOUS

## 2021-03-18 MED ORDER — PROPOFOL 10 MG/ML IV BOLUS
INTRAVENOUS | Status: AC
  Filled 2021-03-18: qty 20

## 2021-03-18 MED ORDER — SODIUM CHLORIDE 0.9 % IV SOLN: INTRAVENOUS | Status: DC | PRN

## 2021-03-18 MED ORDER — BUPIVACAINE HCL (PF) 0.5 % IJ SOLN
INTRAMUSCULAR | Status: AC
  Filled 2021-03-18: qty 30

## 2021-03-18 MED ORDER — COLCHICINE 0.6 MG PO TABS
0.6000 mg | ORAL_TABLET | Freq: Two times a day (BID) | ORAL | Status: DC
  Administered 2021-03-18 – 2021-03-22 (×8): 0.6 mg via ORAL
  Filled 2021-03-18 (×9): qty 1

## 2021-03-18 MED ORDER — ONDANSETRON HCL 4 MG/2ML IJ SOLN
INTRAMUSCULAR | Status: DC | PRN
  Administered 2021-03-18: 4 mg via INTRAVENOUS

## 2021-03-18 MED ORDER — ONDANSETRON HCL 4 MG/2ML IJ SOLN
INTRAMUSCULAR | Status: AC
  Filled 2021-03-18: qty 2

## 2021-03-18 MED ORDER — POTASSIUM CHLORIDE CRYS ER 20 MEQ PO TBCR
40.0000 meq | EXTENDED_RELEASE_TABLET | Freq: Two times a day (BID) | ORAL | 1 refills | Status: DC
  Filled 2021-03-18: qty 60, 15d supply, fill #0

## 2021-03-18 MED ORDER — MIDAZOLAM HCL 2 MG/2ML IJ SOLN
INTRAMUSCULAR | Status: AC
  Filled 2021-03-18: qty 2

## 2021-03-18 MED ORDER — DEXMEDETOMIDINE (PRECEDEX) IN NS 20 MCG/5ML (4 MCG/ML) IV SYRINGE
PREFILLED_SYRINGE | INTRAVENOUS | Status: DC | PRN
  Administered 2021-03-18: 12 ug via INTRAVENOUS
  Administered 2021-03-18: 8 ug via INTRAVENOUS

## 2021-03-18 MED ORDER — FENTANYL CITRATE (PF) 100 MCG/2ML IJ SOLN
INTRAMUSCULAR | Status: DC | PRN
  Administered 2021-03-18: 100 ug via INTRAVENOUS
  Administered 2021-03-18 (×3): 50 ug via INTRAVENOUS

## 2021-03-18 SURGICAL SUPPLY — 107 items
ADH SKN CLS APL DERMABOND .7 (GAUZE/BANDAGES/DRESSINGS) ×1
APL PRP STRL LF DISP 70% ISPRP (MISCELLANEOUS) ×1
APL SRG 22X2 LUM MLBL SLNT (VASCULAR PRODUCTS)
APL SRG 7X2 LUM MLBL SLNT (VASCULAR PRODUCTS)
APL SWBSTK 6 STRL LF DISP (MISCELLANEOUS) ×1
APPLICATOR COTTON TIP 6 STRL (MISCELLANEOUS) ×1 IMPLANT
APPLICATOR COTTON TIP 6IN STRL (MISCELLANEOUS) ×2 IMPLANT
APPLICATOR TIP COSEAL (VASCULAR PRODUCTS) IMPLANT
APPLICATOR TIP EXT COSEAL (VASCULAR PRODUCTS) IMPLANT
BLADE CLIPPER SURG (BLADE) IMPLANT
BLADE SURG 11 STRL SS (BLADE) ×2 IMPLANT
CANISTER SUCT 3000ML PPV (MISCELLANEOUS) ×2 IMPLANT
CATH THORACIC 28FR (CATHETERS) IMPLANT
CATH THORACIC 36FR (CATHETERS) IMPLANT
CATH THORACIC 36FR RT ANG (CATHETERS) IMPLANT
CHLORAPREP W/TINT 26 (MISCELLANEOUS) ×2 IMPLANT
CLEANER TIP ELECTROSURG 2X2 (MISCELLANEOUS) IMPLANT
CNTNR URN SCR LID CUP LEK RST (MISCELLANEOUS) ×1 IMPLANT
CONN ST 1/4X3/8  BEN (MISCELLANEOUS) ×2
CONN ST 1/4X3/8 BEN (MISCELLANEOUS) ×1 IMPLANT
CONN Y 3/8X3/8X3/8  BEN (MISCELLANEOUS)
CONN Y 3/8X3/8X3/8 BEN (MISCELLANEOUS) IMPLANT
CONT SPEC 4OZ STRL OR WHT (MISCELLANEOUS) ×2
COVER SURGICAL LIGHT HANDLE (MISCELLANEOUS) IMPLANT
DEFOGGER ANTIFOG KIT (MISCELLANEOUS) ×2 IMPLANT
DERMABOND ADVANCED (GAUZE/BANDAGES/DRESSINGS) ×1
DERMABOND ADVANCED .7 DNX12 (GAUZE/BANDAGES/DRESSINGS) ×1 IMPLANT
DISSECTOR BLUNT TIP ENDO 5MM (MISCELLANEOUS) IMPLANT
DRAIN CHANNEL 19F RND (DRAIN) ×2 IMPLANT
DRAIN CHANNEL 28F RND 3/8 FF (WOUND CARE) IMPLANT
DRAPE LAPAROSCOPIC ABDOMINAL (DRAPES) IMPLANT
DRAPE WARM FLUID 44X44 (DRAPES) ×2 IMPLANT
ELECT BLADE 4.0 EZ CLEAN MEGAD (MISCELLANEOUS) ×2
ELECT REM PT RETURN 9FT ADLT (ELECTROSURGICAL) ×2
ELECTRODE BLDE 4.0 EZ CLN MEGD (MISCELLANEOUS) ×1 IMPLANT
ELECTRODE REM PT RTRN 9FT ADLT (ELECTROSURGICAL) ×1 IMPLANT
EVACUATOR SILICONE 100CC (DRAIN) ×2 IMPLANT
GAUZE SPONGE 4X4 12PLY STRL (GAUZE/BANDAGES/DRESSINGS) ×2 IMPLANT
GLOVE SURG ENC MOIS LTX SZ7.5 (GLOVE) IMPLANT
GLOVE SURG POLYISO LF SZ7.5 (GLOVE) ×4 IMPLANT
GLOVE SURG UNDER POLY LF SZ6 (GLOVE) ×2 IMPLANT
GLOVE SURG UNDER POLY LF SZ6.5 (GLOVE) ×2 IMPLANT
GOWN STRL REUS W/ TWL LRG LVL3 (GOWN DISPOSABLE) ×3 IMPLANT
GOWN STRL REUS W/ TWL XL LVL3 (GOWN DISPOSABLE) ×1 IMPLANT
GOWN STRL REUS W/TWL LRG LVL3 (GOWN DISPOSABLE) ×6
GOWN STRL REUS W/TWL XL LVL3 (GOWN DISPOSABLE) ×2
KIT BASIN OR (CUSTOM PROCEDURE TRAY) ×2 IMPLANT
KIT SUCTION CATH 14FR (SUCTIONS) IMPLANT
KIT TURNOVER KIT B (KITS) ×2 IMPLANT
LIGASURE VESSEL 5MM BLUNT TIP (ELECTROSURGICAL) ×2 IMPLANT
NEEDLE 22X1 1/2 (OR ONLY) (NEEDLE) ×2 IMPLANT
NEEDLE HYPO 25GX1X1/2 BEV (NEEDLE) IMPLANT
NS IRRIG 1000ML POUR BTL (IV SOLUTION) ×4 IMPLANT
PACK CHEST (CUSTOM PROCEDURE TRAY) ×2 IMPLANT
PACK UNIVERSAL I (CUSTOM PROCEDURE TRAY) ×2 IMPLANT
PAD ARMBOARD 7.5X6 YLW CONV (MISCELLANEOUS) ×4 IMPLANT
PASSER SUT SWANSON 36MM LOOP (INSTRUMENTS) IMPLANT
SCISSORS LAP 5X35 DISP (ENDOMECHANICALS) IMPLANT
SEALANT PROGEL (MISCELLANEOUS) IMPLANT
SEALANT SURG COSEAL 4ML (VASCULAR PRODUCTS) IMPLANT
SEALANT SURG COSEAL 8ML (VASCULAR PRODUCTS) IMPLANT
SLEEVE ENDOPATH XCEL 5M (ENDOMECHANICALS) ×2 IMPLANT
STOPCOCK 4 WAY LG BORE MALE ST (IV SETS) IMPLANT
SUT PROLENE 3 0 SH DA (SUTURE) IMPLANT
SUT PROLENE 4 0 RB 1 (SUTURE)
SUT PROLENE 4-0 RB1 .5 CRCL 36 (SUTURE) IMPLANT
SUT SILK  1 MH (SUTURE) ×2
SUT SILK 1 MH (SUTURE) ×1 IMPLANT
SUT SILK 1 TIES 10X30 (SUTURE) IMPLANT
SUT SILK 2 0SH CR/8 30 (SUTURE) IMPLANT
SUT SILK 3 0SH CR/8 30 (SUTURE) IMPLANT
SUT VIC AB 1 CTX 18 (SUTURE) IMPLANT
SUT VIC AB 1 CTX 36 (SUTURE)
SUT VIC AB 1 CTX36XBRD ANBCTR (SUTURE) IMPLANT
SUT VIC AB 2-0 CT1 27 (SUTURE)
SUT VIC AB 2-0 CT1 TAPERPNT 27 (SUTURE) IMPLANT
SUT VIC AB 2-0 CTX 36 (SUTURE) IMPLANT
SUT VIC AB 3-0 SH 27 (SUTURE) ×2
SUT VIC AB 3-0 SH 27X BRD (SUTURE) ×1 IMPLANT
SUT VIC AB 3-0 X1 27 (SUTURE) IMPLANT
SUT VICRYL 0 UR6 27IN ABS (SUTURE) IMPLANT
SUT VICRYL 2 TP 1 (SUTURE) IMPLANT
SWAB COLLECTION DEVICE MRSA (MISCELLANEOUS) IMPLANT
SWAB CULTURE ESWAB REG 1ML (MISCELLANEOUS) IMPLANT
SYR 10ML LL (SYRINGE) IMPLANT
SYR 20ML LL LF (SYRINGE) IMPLANT
SYR 50ML LL SCALE MARK (SYRINGE) IMPLANT
SYR CONTROL 10ML LL (SYRINGE) ×2 IMPLANT
SYSTEM SAHARA CHEST DRAIN ATS (WOUND CARE) IMPLANT
TAPE CLOTH 4X10 WHT NS (GAUZE/BANDAGES/DRESSINGS) ×2 IMPLANT
TAPE CLOTH SURG 4X10 WHT LF (GAUZE/BANDAGES/DRESSINGS) ×2 IMPLANT
TAPE UMBILICAL COTTON 1/8X30 (MISCELLANEOUS) IMPLANT
TIP APPLICATOR SPRAY EXTEND 16 (VASCULAR PRODUCTS) IMPLANT
TOWEL GREEN STERILE (TOWEL DISPOSABLE) ×2 IMPLANT
TOWEL GREEN STERILE FF (TOWEL DISPOSABLE) ×2 IMPLANT
TRAP SPECIMEN MUCUS 40CC (MISCELLANEOUS) IMPLANT
TRAY CATH INTERMITTENT SS 16FR (CATHETERS) IMPLANT
TRAY FOLEY SLVR 14FR TEMP STAT (SET/KITS/TRAYS/PACK) IMPLANT
TRAY FOLEY SLVR 16FR LF STAT (SET/KITS/TRAYS/PACK) IMPLANT
TRAY FOLEY W/BAG SLVR 14FR LF (SET/KITS/TRAYS/PACK) ×4 IMPLANT
TROCAR XCEL 12X100 BLDLESS (ENDOMECHANICALS) IMPLANT
TROCAR XCEL BLADELESS 5X75MML (TROCAR) ×4 IMPLANT
TUBE CONNECTING 12X1/4 (SUCTIONS) ×2 IMPLANT
TUBING EXTENTION W/L.L. (IV SETS) IMPLANT
TUBING LAP HI FLOW INSUFFLATIO (TUBING) ×2 IMPLANT
TUNNELER SHEATH ON-Q 11GX8 DSP (PAIN MANAGEMENT) IMPLANT
WATER STERILE IRR 1000ML POUR (IV SOLUTION) ×4 IMPLANT

## 2021-03-18 NOTE — Plan of Care (Signed)

## 2021-03-18 NOTE — Brief Op Note (Signed)
03/18/2021  9:18 AM  PATIENT:  [name]  55 y.o. female  PRE-OPERATIVE DIAGNOSIS:  Recurrent pericardial effusion  POST-OPERATIVE DIAGNOSIS:  Recurrent pericardial effusion  PROCEDURE:  Right VIDEO ASSISTED THORACOSCOPY FOR PERICARDIAL WINDOW   SURGEON:  Surgeon(s) and Role:    Lightfoot, Lucile Crater, MD - Primary  PHYSICIAN ASSISTANT: Lars Pinks PA-C  ANESTHESIA:   general  EBL:  Minimal;per anesthesia record  BLOOD ADMINISTERED:none  DRAINS:  28 blake drain placed in there pericardial space    LOCAL MEDICATIONS USED:  OTHER Exparel  SPECIMEN:  Source of Specimen:  Pericardial biopsy  DISPOSITION OF SPECIMEN:  PATHOLOGY  COUNTS CORRECT:  YES  DICTATION: .Dragon Dictation  PLAN OF CARE: Admit to inpatient   PATIENT DISPOSITION:  PACU - hemodynamically stable.   Delay start of Pharmacological VTE agent (>24hrs) due to surgical blood loss or risk of bleeding: yes

## 2021-03-18 NOTE — Progress Notes (Signed)
Subjective:  Sore after pericardial window  355 CC drain ouptut  Objective:  Vital Signs in the last 24 hours: Temp:  [97 F (36.1 C)-98.8 F (37.1 C)] 98.4 F (36.9 C) (06/29 1500) Pulse Rate:  [92-99] 95 (06/29 1010) Resp:  [12-28] 18 (06/29 1600) BP: (107-141)/(66-87) 132/84 (06/29 1600) SpO2:  [89 %-96 %] 94 % (06/29 1600) Arterial Line BP: (122-144)/(65-72) 141/69 (06/29 1010) Weight:  [71.8 kg] 71.8 kg (06/29 0303)  Intake/Output from previous day: 06/28 0701 - 06/29 0700 In: 567 [P.O.:564; I.V.:3] Out: 1037 [Urine:1000; Drains:37]  Physical Exam Vitals and nursing note reviewed.  Constitutional:      General: She is not in acute distress.    Appearance: She is well-developed.  HENT:     Head: Normocephalic and atraumatic.  Eyes:     Conjunctiva/sclera: Conjunctivae normal.     Pupils: Pupils are equal, round, and reactive to light.  Neck:     Vascular: No JVD.  Cardiovascular:     Rate and Rhythm: Regular rhythm. Tachycardia present.     Pulses: Normal pulses and intact distal pulses.     Heart sounds: No murmur heard. Pulmonary:     Effort: Pulmonary effort is normal.     Breath sounds: Normal breath sounds. No wheezing or rales.  Chest:     Comments: Pericardial window with drain in place Abdominal:     General: Bowel sounds are normal.     Palpations: Abdomen is soft.     Tenderness: There is no rebound.  Musculoskeletal:        General: No tenderness. Normal range of motion.     Right lower leg: No edema.     Left lower leg: No edema.  Lymphadenopathy:     Cervical: No cervical adenopathy.  Skin:    General: Skin is warm and dry.  Neurological:     Mental Status: She is alert and oriented to person, place, and time.     Cranial Nerves: No cranial nerve deficit.     Lab Results: BMP Recent Labs    03/16/21 0525 03/17/21 0820 03/18/21 0626  NA 139 135 135  K 3.3* 2.9* 3.2*  CL 103 96* 101  CO2 27 24 26   GLUCOSE 106* 130* 147*  BUN 7 7  9   CREATININE 0.74 0.58 0.83  CALCIUM 8.9 8.7* 8.9  GFRNONAA >60 >60 >60     CBC Recent Labs  Lab 03/16/21 0525  WBC 8.8  RBC 4.09  HGB 11.9*  HCT 37.6  PLT 365  MCV 91.9  MCH 29.1  MCHC 31.6  RDW 15.2     HEMOGLOBIN A1C Lab Results  Component Value Date   HGBA1C 5.4 10/11/2014   MPG 108 10/11/2014    Lipid Panel     Component Value Date/Time   CHOL 75 03/16/2021 0525   TRIG 116 03/16/2021 0525   HDL 25 (L) 03/16/2021 0525   CHOLHDL 3.0 03/16/2021 0525   VLDL 23 03/16/2021 0525   LDLCALC 27 03/16/2021 0525     Imaging: CXR 03/16/2021: Cardiomediastinal silhouette is significantly smaller status post pericardial drainage.  Cardiac Studies: Echocardiogram 03/17/2021:  1. Left ventricular ejection fraction, by estimation, is 60 to 65%. The  left ventricle has normal function.   2. Small circumferential pericardial effusion with mostly clear fluids  but loculated stranding on RV free wall. No tamponade.   3. The inferior vena cava is normal in size with <50% respiratory  variability, suggesting right atrial pressure of  8 mmHg.   4. Minimal increase in pericardial fluid compared to post  pericardiocentesis echcoardiogram images on 03/16/2021.   Echocardiogram 03/16/2021:  1. Left ventricular ejection fraction, by estimation, is 65 to 70%. The  left ventricle has normal function. The left ventricle has no regional  wall motion abnormalities. Left ventricular diastolic function could not  be evaluated.   2. Right ventricular systolic function is normal. The right ventricular  size is normal. There is mildly elevated pulmonary artery systolic  pressure.   3. Large circumferential pericardial effusion, reduced to trivial after  pericardiocentesis. No hemodynamic compromise seen after  pericardiocetnesis.   4. The mitral valve is grossly normal. No evidence of mitral valve  regurgitation.   5. Tricuspid valve regurgitation is moderate.   6. The inferior vena  cava is normal in size with greater than 50%  respiratory variability, suggesting right atrial pressure of 3 mmHg.   Assessment & Recommendations:  55 y.o. Caucasian female with history of family h/o CAD, personal h/o severe major depression, prior suicide attempts, history of alcohol abuse, h/o COVID (2021), recurrent pericardial effusion  Pericardial effusion: S/p pericardiocentesis (6/27) w/1.6 L serosangiunous transudative fluid. Cytology showing reactive cells Drain output 355 cc since window placement this morning. Expect she will need another 2 nights stay  Hypokalemia: K 2.9, even before lasix. This is chronic and recurrent. Etiology hyperaldosteronism vs RTA Outpatient workup. K replacement for now.       Nigel Mormon, MD Pager: [phone] Office: [phone]

## 2021-03-18 NOTE — Anesthesia Procedure Notes (Signed)
Procedure Name: Intubation Date/Time: 03/18/2021 7:48 AM Performed by: Kyung Rudd, CRNA Pre-anesthesia Checklist: Patient identified, Emergency Drugs available, Suction available and Patient being monitored Patient Re-evaluated:Patient Re-evaluated prior to induction Oxygen Delivery Method: Circle system utilized Preoxygenation: Pre-oxygenation with 100% oxygen Induction Type: IV induction Ventilation: Mask ventilation without difficulty Laryngoscope Size: Mac and 3 Grade View: Grade I Endobronchial tube: Left, Double lumen EBT, EBT position confirmed by auscultation and EBT position confirmed by fiberoptic bronchoscope and 37 Fr Number of attempts: 1 Airway Equipment and Method: Stylet Placement Confirmation: ETT inserted through vocal cords under direct vision, positive ETCO2 and breath sounds checked- equal and bilateral Tube secured with: Tape Dental Injury: Teeth and Oropharynx as per pre-operative assessment

## 2021-03-18 NOTE — Plan of Care (Signed)
  Problem: Clinical Measurements: Goal: Respiratory complications will improve Outcome: Progressing Goal: Cardiovascular complication will be avoided Outcome: Progressing   Problem: Nutrition: Goal: Adequate nutrition will be maintained Outcome: Progressing   Problem: Coping: Goal: Level of anxiety will decrease Outcome: Progressing   Problem: Pain Managment: Goal: General experience of comfort will improve Outcome: Progressing   Problem: Safety: Goal: Ability to remain free from injury will improve Outcome: Progressing   Problem: Skin Integrity: Goal: Risk for impaired skin integrity will decrease Outcome: Progressing

## 2021-03-18 NOTE — Discharge Instructions (Signed)
Video-Assisted Thoracic Surgery, Care After What can I expect after the procedure? After the procedure, it is common to have: Some pain and soreness in your chest. Pain when you breathe in or cough. Trouble pooping (constipation). Tiredness. Trouble sleeping. Follow these instructions at home: Preventing lung infection  Take deep breaths and cough often. This helps clear mucus and opens your lungs. Doing this helps prevent lung infection (pneumonia). Use an incentive spirometer if told. This tool shows how much you fill your lungs with each breath. Coughing may hurt less if you try to support your chest. Try one of these when you cough: Hold a pillow on your chest. Place both hands flat on your cut or cuts from surgery (incisions). Do not smoke or use any products that contain nicotine or tobacco. If you need help quitting, ask your doctor. Stay away when people are smoking (avoid secondhand smoke).   Medicines Take over-the-counter and prescription medicines only as told by your doctor. If you have pain, take pain medicine before your pain gets very bad. This is important. Doing this will help you breathe and cough more comfortably. If you were prescribed an antibiotic medicine, take it as told by your doctor. Do not stop using the antibiotic even if you start to feel better. If told, take steps to prevent problems with pooping (constipation). You may need to: Drink enough fluid to keep your pee (urine) pale yellow. Take medicines. You will be told what medicines to take. Eat foods that are high in fiber. These include beans, whole grains, and fresh fruits and vegetables. Limit foods that are high in fat and sugar. These include fried or sweet foods. Ask your doctor if you should avoid driving or using machines while you are taking your medicine. Bathing Do not take baths, swim, or use a hot tub. Ask your doctor about taking showers or sponge baths. Caring for your incision from  surgery  Follow instructions from your doctor about how to take care of your incision. Make sure you: Wash your hands with soap and water for at least 20 seconds before and after you change your bandage. If you cannot use soap and water, use hand sanitizer. Change your bandage as told. Leave stitches, skin glue, or staples in place for at least 2 weeks. Leave tape strips alone unless you are told to take them off. You may trim the edges of the tape strips if they curl up. Keep your bandage dry until it is taken off. Check your incision every day for signs of infection. Check for: Redness, swelling, or more pain. Fluid or blood. Warmth. Pus or a bad smell.   Activity Avoid activities that use your chest muscles for at least 3-4 weeks. Do not lift anything that is heavier than 10 lb (4.5 kg), or the limit that you are told. Return to your normal activities when your doctor says that it is safe. Rest as told by your doctor. Get up to take short walks every 1 to 2 hours. Ask for help if you feel weak or unsteady. Do exercises as told by your doctor. General instructions If you were given a sedative during your procedure, do not drive or use machines until your doctor says that it is safe. A sedative is a medicine that helps you relax. If you have a chest tube, care for it as told. Do not travel by airplane during the 2 weeks after your chest tube is taken out, or until your doctor says that  this is safe. Keep all follow-up visits. Contact a doctor if: You have any of these signs of infection around an incision: Redness, swelling, or more pain. Fluid or blood. Warmth. Pus or a bad smell. You have a fever or chills. You feel like you may vomit or you vomit. Your pain does not get better with medicine. Get help right away if: You have chest pain. You have fast or uneven heartbeats. You get a rash. You are short of breath. You have trouble breathing. You are mixed up (confused). You  have trouble talking. You feel weak, light-headed, or dizzy. You faint. These symptoms may be an emergency. Get help right away. Call your local emergency services (911 in the [place] Do not wait to see if the symptoms will go away. Do not drive yourself to the hospital. Summary Take deep breaths and cough often. This helps clear mucus and opens your lungs. Doing this helps prevent lung infection (pneumonia). If you have pain, take pain medicine before your pain gets very bad. Check your incision from surgery every day for signs of infection. Contact a doctor if you have signs of infection. Return to your normal activities when your doctor says that it is safe. This information is not intended to replace advice given to you by your health care provider. Make sure you discuss any questions you have with your healthcare provider. Document Revised: 05/30/2020 Document Reviewed: 05/30/2020 Elsevier Patient Education  2022 [employer].

## 2021-03-18 NOTE — Op Note (Signed)
      [address]       [place] [address]             [phone]        03/18/2021  Patient:  [name] Pre-Op Dx: Pericardial effusion   Pericarditis   History of COVID-pneumonia Post-op Dx: Same Procedure: -Right video assisted thoracoscopy - Pericardial Window - Evacuation of pericardial effusion   Surgeon and Role:      * Robbin Escher, Lucile Crater, MD - Primary    *D. Tacy Dura, PA-C- assisting  Anesthesia  general EBL: Minimal  Blood Administration: None Specimen: Pericardial window  Drains: 14 F argyle chest tube in right chest Counts: correct   Indications: This is a 55 year old female who was admitted on 03/15/2021 with pericardial effusion.  She underwent drain placement on 03/16/2021 with evacuation 1.6 L of serosanguineous fluid.  She continued to have significant output over the course of the next day.  CTS was consulted to assist with management.  Findings: The pericardial sack was dilated and floppy.  A 3-1/2 x 3-1/2 cm pericardial window was created.  There were pericardial adhesions.  The thin ones were lysed to ensure free flow of the fluid into the pleural space.  The pericardial fluid was serous.  Operative Technique: After the risks, benefits and alternatives were thoroughly discussed, the patient was brought to the operative theatre.  Anesthesia was induced, and the patient was then placed in a lazy lateral decubitus position and was prepped and draped in normal sterile fashion.  An appropriate surgical pause was performed, and pre-operative antibiotics were dosed accordingly.  We began with 1cm incision in the anterior axillary line at the 4th intercostal space.  A 81mm trocar was then introduced.  The pleural cavity was then insufflated with Co2.  The 2 additional trocars were then introduced to triangulate the pericardium.  The pericardial sac was then incised, and a 3-1/2 x 3-1/2 cm window was created anterior to the phrenic nerve.  The  pericardial fluid was serous.  The pericardium was removed, and 18F blake drain was then passed into the pericardial sac.  We watch the remaining lobes re-expand.  The skin and soft tissue were closed with absorbable suture    The patient tolerated the procedure without any immediate complications, and was transferred to the PACU in stable condition.  Apolinar Bero Bary Leriche

## 2021-03-18 NOTE — Transfer of Care (Signed)
Immediate Anesthesia Transfer of Care Note  Patient: [name]  Procedure(s) Performed: VIDEO ASSISTED THORACOSCOPY FOR PERICARDIAL WINDOW (Right)  Patient Location: PACU  Anesthesia Type:General  Level of Consciousness: awake, alert  and oriented  Airway & Oxygen Therapy: Patient Spontanous Breathing and Patient connected to face mask oxygen  Post-op Assessment: Report given to RN, Post -op Vital signs reviewed and stable and Patient moving all extremities X 4  Post vital signs: Reviewed and stable  Last Vitals:  Vitals Value Taken Time  BP 113/74 03/18/21 0933  Temp 36.2 C 03/18/21 0933  Pulse 93 03/18/21 0940  Resp 14 03/18/21 0940  SpO2 94 % 03/18/21 0940  Vitals shown include unvalidated device data.  Last Pain:  Vitals:   03/18/21 0000  TempSrc: Oral  PainSc: 0-No pain      Patients Stated Pain Goal: 2 (79/44/46 1901)  Complications: No notable events documented.

## 2021-03-18 NOTE — H&P (Signed)
     [address]       [place] [address]             [phone]       No events  Vitals:   03/18/21 0500 03/18/21 0600  BP:    Pulse:    Resp: 15 20  Temp:    SpO2: (!) 89%    Alert NAD EWOB  OR today for R VATS, pericardial window.  Mariaha Ellington Bary Leriche

## 2021-03-18 NOTE — Anesthesia Procedure Notes (Signed)
Arterial Line Insertion Start/End6/29/2022 7:28 AM, 03/18/2021 7:32 AM Performed by: Janace Litten, CRNA, CRNA  Patient location: Pre-op. Preanesthetic checklist: patient identified, IV checked, site marked, risks and benefits discussed, surgical consent, monitors and equipment checked, pre-op evaluation and timeout performed Lidocaine 1% used for infiltration and patient sedated Right, radial was placed Catheter size: 20 G Hand hygiene performed , maximum sterile barriers used  and Seldinger technique used Allen's test indicative of satisfactory collateral circulation Attempts: 1 Procedure performed without using ultrasound guided technique. Following insertion, Biopatch and dressing applied. Post procedure assessment: normal  Patient tolerated the procedure well with no immediate complications.

## 2021-03-18 NOTE — Anesthesia Postprocedure Evaluation (Signed)
Anesthesia Post Note  Patient: [name]  Procedure(s) Performed: VIDEO ASSISTED THORACOSCOPY FOR PERICARDIAL WINDOW (Right)     Patient location during evaluation: PACU Anesthesia Type: General Level of consciousness: awake Pain management: pain level controlled Vital Signs Assessment: post-procedure vital signs reviewed and stable Respiratory status: spontaneous breathing, nonlabored ventilation, respiratory function stable and patient connected to nasal cannula oxygen Cardiovascular status: blood pressure returned to baseline and stable Postop Assessment: no apparent nausea or vomiting Anesthetic complications: no   No notable events documented.  Last Vitals:  Vitals:   03/18/21 1500 03/18/21 1600  BP:  132/84  Pulse:    Resp: (!) 28 18  Temp: 36.9 C   SpO2: 90% 94%    Last Pain:  Vitals:   03/18/21 1600  TempSrc:   PainSc: Asleep                 Malosi Hemstreet P Renso Swett

## 2021-03-19 ENCOUNTER — Encounter (HOSPITAL_COMMUNITY): Payer: Self-pay | Admitting: Thoracic Surgery (Cardiothoracic Vascular Surgery)

## 2021-03-19 ENCOUNTER — Inpatient Hospital Stay (HOSPITAL_COMMUNITY): Payer: Medicare Other

## 2021-03-19 LAB — CBC
HCT: 44.1 % (ref 36.0–46.0)
Hemoglobin: 13.8 g/dL (ref 12.0–15.0)
MCH: 28.4 pg (ref 26.0–34.0)
MCHC: 31.3 g/dL (ref 30.0–36.0)
MCV: 90.7 fL (ref 80.0–100.0)
Platelets: 394 10*3/uL (ref 150–400)
RBC: 4.86 MIL/uL (ref 3.87–5.11)
RDW: 14.3 % (ref 11.5–15.5)
WBC: 11.6 10*3/uL — ABNORMAL HIGH (ref 4.0–10.5)
nRBC: 0 % (ref 0.0–0.2)

## 2021-03-19 LAB — BASIC METABOLIC PANEL
Anion gap: 6 (ref 5–15)
BUN: 6 mg/dL (ref 6–20)
CO2: 26 mmol/L (ref 22–32)
Calcium: 9.1 mg/dL (ref 8.9–10.3)
Chloride: 104 mmol/L (ref 98–111)
Creatinine, Ser: 0.69 mg/dL (ref 0.44–1.00)
GFR, Estimated: >60 mL/min (ref >60)
Glucose, Bld: 121 mg/dL — ABNORMAL HIGH (ref 70–99)
Potassium: 4.5 mmol/L (ref 3.5–5.1)
Sodium: 136 mmol/L (ref 135–145)

## 2021-03-19 LAB — BODY FLUID CULTURE W GRAM STAIN: Culture: NO GROWTH

## 2021-03-19 LAB — SURGICAL PATHOLOGY

## 2021-03-19 NOTE — Progress Notes (Signed)
     [address]       ,[place] [address]             [phone]       Overall doing well Complains of incision pain >500 out from drain  Vitals:   03/19/21 1400 03/19/21 1616  BP: 128/80 (!) 123/94  Pulse:    Resp: (!) 22 (!) 26  Temp:    SpO2: 92% 92%   Alert NAD Sinus EWOB  POD 1 s/p R VATS pericardial window Will keep drain until < 221mls Ok for floor   [employer]

## 2021-03-19 NOTE — Progress Notes (Signed)
Subjective:  Patient remains sore this morning following pericardial window yesterday.  Blood pressure is soft be stable this morning  Heart rate better controlled, approximately 85-90 bpm at time of exam   560 CC drain output   Objective:  Vital Signs in the last 24 hours: Temp:  [97 F (36.1 C)-98.4 F (36.9 C)] 98 F (36.7 C) (06/30 0400) Pulse Rate:  [82-101] 82 (06/30 0400) Resp:  [12-28] 14 (06/30 0700) BP: (96-141)/(59-87) 105/70 (06/30 0700) SpO2:  [85 %-98 %] 92 % (06/30 0700) Arterial Line BP: (122-144)/(65-72) 141/69 (06/29 1010) Weight:  [72.2 kg] 72.2 kg (06/30 0405)  Intake/Output from previous day: 06/29 0701 - 06/30 0700 In: 1230.1 [I.V.:930; IV Piggyback:200.1] Out: 1395 [Urine:825; Drains:560; Blood:10]  Physical Exam Vitals and nursing note reviewed.  Constitutional:      General: She is not in acute distress.    Appearance: She is well-developed.  HENT:     Head: Normocephalic and atraumatic.  Eyes:     Conjunctiva/sclera: Conjunctivae normal.     Pupils: Pupils are equal, round, and reactive to light.  Neck:     Vascular: No JVD.  Cardiovascular:     Rate and Rhythm: Normal rate and regular rhythm.     Pulses: Normal pulses and intact distal pulses.     Heart sounds: No murmur heard. Pulmonary:     Effort: Pulmonary effort is normal.     Breath sounds: Normal breath sounds. No wheezing or rales.  Chest:     Comments: Pericardial window with drain in place Abdominal:     General: Bowel sounds are normal.     Palpations: Abdomen is soft.     Tenderness: There is no rebound.  Musculoskeletal:        General: No tenderness. Normal range of motion.     Right lower leg: No edema.     Left lower leg: No edema.  Lymphadenopathy:     Cervical: No cervical adenopathy.  Skin:    General: Skin is warm and dry.  Neurological:     Mental Status: She is alert and oriented to person, place, and time.     Cranial Nerves: No cranial nerve deficit.      Lab Results: BMP Recent Labs    03/17/21 0820 03/18/21 0626 03/19/21 0403  NA 135 135 136  K 2.9* 3.2* 4.5  CL 96* 101 104  CO2 24 26 26   GLUCOSE 130* 147* 121*  BUN 7 9 6   CREATININE 0.58 0.83 0.69  CALCIUM 8.7* 8.9 9.1  GFRNONAA >60 >60 >60     CBC Recent Labs  Lab 03/19/21 0403  WBC 11.6*  RBC 4.86  HGB 13.8  HCT 44.1  PLT 394  MCV 90.7  MCH 28.4  MCHC 31.3  RDW 14.3     HEMOGLOBIN A1C Lab Results  Component Value Date   HGBA1C 5.4 10/11/2014   MPG 108 10/11/2014    Lipid Panel     Component Value Date/Time   CHOL 75 03/16/2021 0525   TRIG 116 03/16/2021 0525   HDL 25 (L) 03/16/2021 0525   CHOLHDL 3.0 03/16/2021 0525   VLDL 23 03/16/2021 0525   LDLCALC 27 03/16/2021 0525     Imaging: CXR 03/19/2021:  PowerPort catheter and pericardial drainage catheter stable position. Stable cardiomegaly. No pulmonary venous congestion. Low lung volumes with bibasilar atelectasis/infiltrates again noted. Small left pleural effusion. No pneumothorax. Spinal cord stimulator stable position.  CXR 03/18/2021:  Pericardial drain placed. Progression of bibasilar  atelectasis and small effusions  CXR 03/16/2021: Cardiomediastinal silhouette is significantly smaller status post pericardial drainage.  Cardiac Studies: Echocardiogram 03/17/2021:  1. Left ventricular ejection fraction, by estimation, is 60 to 65%. The  left ventricle has normal function.   2. Small circumferential pericardial effusion with mostly clear fluids  but loculated stranding on RV free wall. No tamponade.   3. The inferior vena cava is normal in size with <50% respiratory  variability, suggesting right atrial pressure of 8 mmHg.   4. Minimal increase in pericardial fluid compared to post  pericardiocentesis echcoardiogram images on 03/16/2021.   Echocardiogram 03/16/2021:  1. Left ventricular ejection fraction, by estimation, is 65 to 70%. The  left ventricle has normal function.  The left ventricle has no regional  wall motion abnormalities. Left ventricular diastolic function could not  be evaluated.   2. Right ventricular systolic function is normal. The right ventricular  size is normal. There is mildly elevated pulmonary artery systolic  pressure.   3. Large circumferential pericardial effusion, reduced to trivial after  pericardiocentesis. No hemodynamic compromise seen after  pericardiocetnesis.   4. The mitral valve is grossly normal. No evidence of mitral valve  regurgitation.   5. Tricuspid valve regurgitation is moderate.   6. The inferior vena cava is normal in size with greater than 50%  respiratory variability, suggesting right atrial pressure of 3 mmHg.   Assessment & Recommendations:  55 y.o. Caucasian female with history of family h/o CAD, personal h/o severe major depression, prior suicide attempts, history of alcohol abuse, h/o COVID (2021), recurrent pericardial effusion  Pericardial effusion/Pericarditis: S/p pericardiocentesis (6/27) w/1.6 L serosangiunous transudative fluid. Cytology revealed reactive cells Drain output 560 cc since window placement yesterday morning Continue colchicine 0.6 mg p.o. BID  Discussed with surgical team, agree that patient is stable for transfer out of the ICU today. However will continue to monitor for additional 1-2 days.  Will continue to work closely with surgical team to determine when she is stable for discharge both from surgical and cardiology standpoint  Screening ANA and RF are pending    Hypokalemia: K 4.5 Continue K replacement while inpatient. Continue to monitor closely  This is chronic and recurrent.  Etiology hyperaldosteronism vs RTA. Recommend further outpatient workup.   Patient was seen in collaboration with Dr. Einar Gip. He also reviewed patient's chart and examined the patient. Dr. Einar Gip is in agreement of the plan.    Alethia Berthold, PA-C 03/19/2021, 7:52 AM Office:  [phone]

## 2021-03-20 ENCOUNTER — Other Ambulatory Visit (HOSPITAL_COMMUNITY): Payer: Self-pay

## 2021-03-20 LAB — CBC
HCT: 44.6 % (ref 36.0–46.0)
Hemoglobin: 14.1 g/dL (ref 12.0–15.0)
MCH: 28.3 pg (ref 26.0–34.0)
MCHC: 31.6 g/dL (ref 30.0–36.0)
MCV: 89.6 fL (ref 80.0–100.0)
Platelets: 430 10*3/uL — ABNORMAL HIGH (ref 150–400)
RBC: 4.98 MIL/uL (ref 3.87–5.11)
RDW: 14.2 % (ref 11.5–15.5)
WBC: 10.5 10*3/uL (ref 4.0–10.5)
nRBC: 0 % (ref 0.0–0.2)

## 2021-03-20 LAB — COMPREHENSIVE METABOLIC PANEL
ALT: 36 U/L (ref 0–44)
AST: 44 U/L — ABNORMAL HIGH (ref 15–41)
Albumin: 3.1 g/dL — ABNORMAL LOW (ref 3.5–5.0)
Alkaline Phosphatase: 79 U/L (ref 38–126)
Anion gap: 7 (ref 5–15)
BUN: 7 mg/dL (ref 6–20)
CO2: 29 mmol/L (ref 22–32)
Calcium: 9 mg/dL (ref 8.9–10.3)
Chloride: 100 mmol/L (ref 98–111)
Creatinine, Ser: 0.68 mg/dL (ref 0.44–1.00)
GFR, Estimated: >60 mL/min (ref >60)
Glucose, Bld: 118 mg/dL — ABNORMAL HIGH (ref 70–99)
Potassium: 3.4 mmol/L — ABNORMAL LOW (ref 3.5–5.1)
Sodium: 136 mmol/L (ref 135–145)
Total Bilirubin: 0.6 mg/dL (ref 0.3–1.2)
Total Protein: 6.3 g/dL — ABNORMAL LOW (ref 6.5–8.1)

## 2021-03-20 MED ORDER — ASPIRIN 325 MG PO TABS
325.0000 mg | ORAL_TABLET | Freq: Three times a day (TID) | ORAL | Status: DC
  Administered 2021-03-20 – 2021-03-21 (×4): 325 mg via ORAL
  Filled 2021-03-20 (×4): qty 1

## 2021-03-20 MED ORDER — PANTOPRAZOLE SODIUM 40 MG PO TBEC
40.0000 mg | DELAYED_RELEASE_TABLET | Freq: Every day | ORAL | Status: DC
  Administered 2021-03-20 – 2021-03-22 (×3): 40 mg via ORAL
  Filled 2021-03-20 (×3): qty 1

## 2021-03-20 MED ORDER — POTASSIUM CHLORIDE CRYS ER 20 MEQ PO TBCR
40.0000 meq | EXTENDED_RELEASE_TABLET | Freq: Two times a day (BID) | ORAL | Status: DC
  Administered 2021-03-20 – 2021-03-22 (×5): 40 meq via ORAL
  Filled 2021-03-20 (×5): qty 2

## 2021-03-20 NOTE — Progress Notes (Signed)
      [address]       [place] [address]             [phone]      2 Days Post-Op Procedure(s) (LRB): VIDEO ASSISTED THORACOSCOPY FOR PERICARDIAL WINDOW (Right) Subjective: Feels okay this morning, she did not sleep well.  Objective: Vital signs in last 24 hours: Temp:  [98 F (36.7 C)-98.7 F (37.1 C)] 98 F (36.7 C) (07/01 0759) Pulse Rate:  [94-110] 97 (07/01 0759) Cardiac Rhythm: Sinus tachycardia (06/30 2040) Resp:  [12-26] 18 (07/01 0759) BP: (115-146)/(71-94) 119/73 (07/01 0759) SpO2:  [89 %-94 %] 91 % (07/01 0759)     Intake/Output from previous day: 06/30 0701 - 07/01 0700 In: 430 [P.O.:420; I.V.:10] Out: 326 [Urine:1; Drains:325] Intake/Output this shift: No intake/output data recorded.  General appearance: alert, cooperative, and no distress Heart: regular rate and rhythm, S1, S2 normal, no murmur, click, rub or gallop Lungs: clear to auscultation bilaterally Abdomen: soft, non-tender; bowel sounds normal; no masses,  no organomegaly Extremities: extremities normal, atraumatic, no cyanosis or edema Wound: clean and dry  Lab Results: Recent Labs    03/19/21 0403 03/20/21 0315  WBC 11.6* 10.5  HGB 13.8 14.1  HCT 44.1 44.6  PLT 394 430*   BMET:  Recent Labs    03/19/21 0403 03/20/21 0315  NA 136 136  K 4.5 3.4*  CL 104 100  CO2 26 29  GLUCOSE 121* 118*  BUN 6 7  CREATININE 0.69 0.68  CALCIUM 9.1 9.0    PT/INR: No results for input(s): LABPROT, INR in the last 72 hours. ABG    Component Value Date/Time   PHART 7.510 (H) 01/31/2019 1605   HCO3 25.3 01/31/2019 1605   TCO2 30 01/22/2015 0343   ACIDBASEDEF 1.0 01/19/2015 2356   O2SAT 98.2 01/31/2019 1605   CBG (last 3)  No results for input(s): GLUCAP in the last 72 hours.  Assessment/Plan: S/P Procedure(s) (LRB): VIDEO ASSISTED THORACOSCOPY FOR PERICARDIAL WINDOW (Right)  S/P pericardial window POD 2. Drain output is 325cc/24 hours. Will wait until less than  250cc for removal.  Hypokalemia-will leave up to Cardiology for replacement Pain control- the patient states her pain was well controlled in the ICU but she got behind on her regimen with transfer.  Labs stable, WBC 10.5, H and H 14.1/44.6, creatinine 0.68 Surgical Path: Pericardium with inflammation consistent with pericarditis, no malignancy identified   Plan: CXR in the morning, Keep pericardial drain, encouraged ambulation around the unit, working on better pain control today.    LOS: 5 days    Elgie Collard 03/20/2021

## 2021-03-20 NOTE — Plan of Care (Signed)

## 2021-03-20 NOTE — Progress Notes (Signed)
Subjective:  Pain at incision site  325 CC drain ouptut/24 hrs  Objective:  Vital Signs in the last 24 hours: Temp:  [98 F (36.7 C)-98.7 F (37.1 C)] 98 F (36.7 C) (07/01 0759) Pulse Rate:  [94-110] 97 (07/01 0759) Resp:  [16-26] 18 (07/01 0759) BP: (115-146)/(71-94) 119/73 (07/01 0759) SpO2:  [89 %-95 %] 95 % (07/01 0817)  Intake/Output from previous day: 06/30 0701 - 07/01 0700 In: 430 [P.O.:420; I.V.:10] Out: 326 [Urine:1; Drains:325]  Physical Exam Vitals and nursing note reviewed.  Constitutional:      General: She is not in acute distress.    Appearance: She is well-developed.  HENT:     Head: Normocephalic and atraumatic.  Eyes:     Conjunctiva/sclera: Conjunctivae normal.     Pupils: Pupils are equal, round, and reactive to light.  Neck:     Vascular: No JVD.  Cardiovascular:     Rate and Rhythm: Regular rhythm. Tachycardia present.     Pulses: Normal pulses and intact distal pulses.     Heart sounds: No murmur heard. Pulmonary:     Effort: Pulmonary effort is normal.     Breath sounds: Normal breath sounds. No wheezing or rales.  Chest:     Comments: Pericardial window with drain in place Abdominal:     General: Bowel sounds are normal.     Palpations: Abdomen is soft.     Tenderness: There is no rebound.  Musculoskeletal:        General: No tenderness. Normal range of motion.     Right lower leg: No edema.     Left lower leg: No edema.  Lymphadenopathy:     Cervical: No cervical adenopathy.  Skin:    General: Skin is warm and dry.  Neurological:     Mental Status: She is alert and oriented to person, place, and time.     Cranial Nerves: No cranial nerve deficit.     Lab Results: BMP Recent Labs    03/18/21 0626 03/19/21 0403 03/20/21 0315  NA 135 136 136  K 3.2* 4.5 3.4*  CL 101 104 100  CO2 26 26 29   GLUCOSE 147* 121* 118*  BUN 9 6 7   CREATININE 0.83 0.69 0.68  CALCIUM 8.9 9.1 9.0  GFRNONAA >60 >60 >60     CBC Recent Labs   Lab 03/20/21 0315  WBC 10.5  RBC 4.98  HGB 14.1  HCT 44.6  PLT 430*  MCV 89.6  MCH 28.3  MCHC 31.6  RDW 14.2     HEMOGLOBIN A1C Lab Results  Component Value Date   HGBA1C 5.4 10/11/2014   MPG 108 10/11/2014    Lipid Panel     Component Value Date/Time   CHOL 75 03/16/2021 0525   TRIG 116 03/16/2021 0525   HDL 25 (L) 03/16/2021 0525   CHOLHDL 3.0 03/16/2021 0525   VLDL 23 03/16/2021 0525   LDLCALC 27 03/16/2021 0525     Imaging: CXR 03/16/2021: Cardiomediastinal silhouette is significantly smaller status post pericardial drainage.  Cardiac Studies: Echocardiogram 03/17/2021:  1. Left ventricular ejection fraction, by estimation, is 60 to 65%. The  left ventricle has normal function.   2. Small circumferential pericardial effusion with mostly clear fluids  but loculated stranding on RV free wall. No tamponade.   3. The inferior vena cava is normal in size with <50% respiratory  variability, suggesting right atrial pressure of 8 mmHg.   4. Minimal increase in pericardial fluid compared to post  pericardiocentesis echcoardiogram images on 03/16/2021.   Echocardiogram 03/16/2021:  1. Left ventricular ejection fraction, by estimation, is 65 to 70%. The  left ventricle has normal function. The left ventricle has no regional  wall motion abnormalities. Left ventricular diastolic function could not  be evaluated.   2. Right ventricular systolic function is normal. The right ventricular  size is normal. There is mildly elevated pulmonary artery systolic  pressure.   3. Large circumferential pericardial effusion, reduced to trivial after  pericardiocentesis. No hemodynamic compromise seen after  pericardiocetnesis.   4. The mitral valve is grossly normal. No evidence of mitral valve  regurgitation.   5. Tricuspid valve regurgitation is moderate.   6. The inferior vena cava is normal in size with greater than 50%  respiratory variability, suggesting right atrial  pressure of 3 mmHg.   Assessment & Recommendations:  55 y.o. Caucasian female with history of family h/o CAD, personal h/o severe major depression, prior suicide attempts, history of alcohol abuse, h/o COVID (2021), recurrent pericardial effusion  Pericardial effusion: S/p pericardiocentesis (6/27) w/1.6 L serosangiunous transudative fluid. Cytology showing reactive cells Drain output 325 cc/24 hr She reports history of GI bleeding with NSAIDs and reluctant to take it.  I started aspirin 325 mg every 8 hour. Continue colchicine 0.6 mg twice daily.  Hypokalemia: This is chronic and recurrent. Etiology hyperaldosteronism vs RTA Outpatient workup. K replacement for now.      Nigel Mormon, MD Pager: [phone] Office: [phone]

## 2021-03-20 NOTE — Care Management Important Message (Signed)
Important Message  Patient Details  Name: [name] MRN: [id number] Date of Birth: 06/10/1966   Medicare Important Message Given:  Yes - Important Message mailed due to current National Emergency  Verbal consent obtained due to current National Emergency  Relationship to patient: Self Contact Name: Trish Call Date: 03/20/21  Time: 1028 Phone: [phone] Outcome: No Answer/Busy Important Message mailed to: Patient address on file   Delorse Lek 03/20/2021, 10:28 AM

## 2021-03-21 ENCOUNTER — Inpatient Hospital Stay (HOSPITAL_COMMUNITY): Payer: Medicare Other

## 2021-03-21 MED ORDER — SPIRONOLACTONE 25 MG PO TABS
25.0000 mg | ORAL_TABLET | Freq: Every day | ORAL | Status: DC
  Administered 2021-03-21 – 2021-03-22 (×2): 25 mg via ORAL
  Filled 2021-03-21 (×2): qty 1

## 2021-03-21 MED ORDER — SUCRALFATE 1 GM/10ML PO SUSP
1.0000 g | Freq: Four times a day (QID) | ORAL | Status: DC
  Administered 2021-03-21 – 2021-03-22 (×3): 1 g via ORAL
  Filled 2021-03-21 (×3): qty 10

## 2021-03-21 MED ORDER — INDOMETHACIN ER 75 MG PO CPCR
75.0000 mg | ORAL_CAPSULE | Freq: Two times a day (BID) | ORAL | Status: DC
  Administered 2021-03-21 – 2021-03-22 (×2): 75 mg via ORAL
  Filled 2021-03-21 (×3): qty 1

## 2021-03-21 NOTE — Progress Notes (Signed)
Subjective:  Severe pain controlled with narcotics, 160 ml drain last 24 huors compared to 300+ yesterday.  She is chronically on pain medications for arthritis with oxycodone at home.   Objective:  Vital Signs in the last 24 hours: Temp:  [97.6 F (36.4 C)-98 F (36.7 C)] 98 F (36.7 C) (07/02 0835) Pulse Rate:  [87-98] 87 (07/02 0835) Resp:  [17-20] 19 (07/02 0835) BP: (113-131)/(75-87) 115/85 (07/02 1022) SpO2:  [91 %-93 %] 93 % (07/02 0835) Weight:  [68.4 kg] 68.4 kg (07/02 0445)  Intake/Output from previous day: 07/01 0701 - 07/02 0700 In: 726 [P.O.:720; I.V.:6] Out: 360 [Urine:200; Drains:160]  Physical Exam Vitals and nursing note reviewed.  Constitutional:      General: She is not in acute distress.    Appearance: She is well-developed.  HENT:     Head: Normocephalic and atraumatic.  Eyes:     Conjunctiva/sclera: Conjunctivae normal.     Pupils: Pupils are equal, round, and reactive to light.  Neck:     Vascular: No JVD.  Cardiovascular:     Rate and Rhythm: Regular rhythm. Tachycardia present.     Pulses: Normal pulses and intact distal pulses.     Heart sounds: No murmur heard.    Comments: No pericardial friction rub. Pulmonary:     Effort: Pulmonary effort is normal.     Breath sounds: Normal breath sounds. No wheezing or rales.  Chest:     Comments: Pericardial window with drain in place Abdominal:     General: Bowel sounds are normal.     Palpations: Abdomen is soft.     Tenderness: There is no rebound.  Musculoskeletal:        General: No tenderness. Normal range of motion.     Right lower leg: No edema.     Left lower leg: No edema.  Lymphadenopathy:     Cervical: No cervical adenopathy.  Skin:    General: Skin is warm and dry.  Neurological:     Mental Status: She is alert and oriented to person, place, and time.     Cranial Nerves: No cranial nerve deficit.   Lab Results: BMP Recent Labs    03/18/21 0626 03/19/21 0403 03/20/21 0315   NA 135 136 136  K 3.2* 4.5 3.4*  CL 101 104 100  CO2 26 26 29   GLUCOSE 147* 121* 118*  BUN 9 6 7   CREATININE 0.83 0.69 0.68  CALCIUM 8.9 9.1 9.0  GFRNONAA >60 >60 >60    CBC Recent Labs  Lab 03/20/21 0315  WBC 10.5  RBC 4.98  HGB 14.1  HCT 44.6  PLT 430*  MCV 89.6  MCH 28.3  MCHC 31.6  RDW 14.2    HEMOGLOBIN A1C Lab Results  Component Value Date   HGBA1C 5.4 10/11/2014   MPG 108 10/11/2014   Lipid Panel     Component Value Date/Time   CHOL 75 03/16/2021 0525   TRIG 116 03/16/2021 0525   HDL 25 (L) 03/16/2021 0525   CHOLHDL 3.0 03/16/2021 0525   VLDL 23 03/16/2021 0525   LDLCALC 27 03/16/2021 0525   Imaging: CXR 03/16/2021: Cardiomediastinal silhouette is significantly smaller status post pericardial drainage.  Cardiac Studies: Echocardiogram 03/17/2021:  1. Left ventricular ejection fraction, by estimation, is 60 to 65%. The  left ventricle has normal function.   2. Small circumferential pericardial effusion with mostly clear fluids  but loculated stranding on RV free wall. No tamponade.   3. The inferior vena cava  is normal in size with <50% respiratory  variability, suggesting right atrial pressure of 8 mmHg.   4. Minimal increase in pericardial fluid compared to post  pericardiocentesis echcoardiogram images on 03/16/2021.   Echocardiogram 03/16/2021:  1. Left ventricular ejection fraction, by estimation, is 65 to 70%. The  left ventricle has normal function. The left ventricle has no regional  wall motion abnormalities. Left ventricular diastolic function could not  be evaluated.   2. Right ventricular systolic function is normal. The right ventricular  size is normal. There is mildly elevated pulmonary artery systolic  pressure.   3. Large circumferential pericardial effusion, reduced to trivial after  pericardiocentesis. No hemodynamic compromise seen after  pericardiocetnesis.   4. The mitral valve is grossly normal. No evidence of mitral  valve  regurgitation.   5. Tricuspid valve regurgitation is moderate.   6. The inferior vena cava is normal in size with greater than 50%  respiratory variability, suggesting right atrial pressure of 3 mmHg.   Assessment & Recommendations:  55 y.o. Caucasian female with history of family h/o CAD, personal h/o severe major depression, prior suicide attempts, history of alcohol abuse, h/o COVID (2021), recurrent pericardial effusion  Pericardial effusion, most probably idiopathic or viral: S/p pericardiocentesis (6/27) w/1.6 L serosangiunous transudative fluid. Cytology showing reactive cells Drain output 325 cc/24 hr yesterday and 160 cc today.  Presently on aspirin 325 mg every 8 hour, colchicine 0.6 mg twice daily.  I discussed with the patient regarding use of indomethacin for anti-inflammatory effect, high-dose aspirin could also cause if not less probably equivalent GI side effects.  Patient is willing to try indomethacin, I will also start her on Carafate prophylactically.  She is presently on Protonix.  Hypokalemia: This is chronic and recurrent. Etiology hyperaldosteronism (Unlikely in view of normal BP and absence of any other signs) vs RTA Outpatient workup. Will add Aldactone for mild diuresis and also may potentially help with pericardial effusion and hypokalemia.   Dispo: Await TCTS recommendation for discharge. Probably tomorrow if now further significant drain.     Adrian Prows, MD, [facility] 03/21/2021, 11:17 AM Office: [phone] Fax: [phone] Pager: [phone]

## 2021-03-21 NOTE — Progress Notes (Addendum)
      [address]       [place] [address]             [phone]      3 Days Post-Op Procedure(s) (LRB): VIDEO ASSISTED THORACOSCOPY FOR PERICARDIAL WINDOW (Right)  Subjective:  No new complaints.  States she never knew how bad a chest tube could be.  Getting pain relief with medications  Objective: Vital signs in last 24 hours: Temp:  [97.6 F (36.4 C)-98 F (36.7 C)] 98 F (36.7 C) (07/02 0835) Pulse Rate:  [87-98] 87 (07/02 0835) Cardiac Rhythm: Normal sinus rhythm (07/02 0700) Resp:  [17-20] 19 (07/02 0835) BP: (113-131)/(75-87) 115/85 (07/02 1022) SpO2:  [91 %-93 %] 93 % (07/02 0835) Weight:  [68.4 kg] 68.4 kg (07/02 0445)  Intake/Output from previous day: 07/01 0701 - 07/02 0700 In: 726 [P.O.:720; I.V.:6] Out: 360 [Urine:200; Drains:160] Intake/Output this shift: Total I/O In: 240 [P.O.:240] Out: -   General appearance: alert, cooperative, and no distress Heart: regular rate and rhythm Wound: clean and dry  Lab Results: Recent Labs    03/19/21 0403 03/20/21 0315  WBC 11.6* 10.5  HGB 13.8 14.1  HCT 44.1 44.6  PLT 394 430*   BMET:  Recent Labs    03/19/21 0403 03/20/21 0315  NA 136 136  K 4.5 3.4*  CL 104 100  CO2 26 29  GLUCOSE 121* 118*  BUN 6 7  CREATININE 0.69 0.68  CALCIUM 9.1 9.0    PT/INR: No results for input(s): LABPROT, INR in the last 72 hours. ABG    Component Value Date/Time   PHART 7.510 (H) 01/31/2019 1605   HCO3 25.3 01/31/2019 1605   TCO2 30 01/22/2015 0343   ACIDBASEDEF 1.0 01/19/2015 2356   O2SAT 98.2 01/31/2019 1605   CBG (last 3)  No results for input(s): GLUCAP in the last 72 hours.  Assessment/Plan: S/P Procedure(s) (LRB): VIDEO ASSISTED THORACOSCOPY FOR PERICARDIAL WINDOW (Right)  JP drain- 160 cc output yesterday, all serous- improving, would leave drain in one more day if remains low, will d/c drain tomorrow Dispo- per primary   LOS: 6 days    Ellwood Handler,  PA-C 03/21/2021   Agree with above. Overall doing well. Will remove drain today. Continue ambulation and pulmonary toilet.  Tymere Depuy Bary Leriche

## 2021-03-21 NOTE — Plan of Care (Signed)
  Problem: Education: Goal: Knowledge of General Education information will improve Description: Including pain rating scale, medication(s)/side effects and non-pharmacologic comfort measures Outcome: Progressing   Problem: Health Behavior/Discharge Planning: Goal: Ability to manage health-related needs will improve Outcome: Progressing   Problem: Clinical Measurements: Goal: Ability to maintain clinical measurements within normal limits will improve Outcome: Progressing   Problem: Clinical Measurements: Goal: Will remain free from infection Outcome: Progressing   Problem: Clinical Measurements: Goal: Cardiovascular complication will be avoided Outcome: Progressing   Problem: Activity: Goal: Risk for activity intolerance will decrease Outcome: Progressing   Problem: Nutrition: Goal: Adequate nutrition will be maintained Outcome: Progressing   Problem: Pain Managment: Goal: General experience of comfort will improve Outcome: Progressing   Problem: Safety: Goal: Ability to remain free from injury will improve Outcome: Progressing   Problem: Skin Integrity: Goal: Risk for impaired skin integrity will decrease Outcome: Progressing

## 2021-03-22 ENCOUNTER — Inpatient Hospital Stay (HOSPITAL_COMMUNITY): Payer: Medicare Other

## 2021-03-22 LAB — BASIC METABOLIC PANEL
Anion gap: 8 (ref 5–15)
BUN: 12 mg/dL (ref 6–20)
CO2: 26 mmol/L (ref 22–32)
Calcium: 9 mg/dL (ref 8.9–10.3)
Chloride: 104 mmol/L (ref 98–111)
Creatinine, Ser: 0.7 mg/dL (ref 0.44–1.00)
GFR, Estimated: >60 mL/min (ref >60)
Glucose, Bld: 134 mg/dL — ABNORMAL HIGH (ref 70–99)
Potassium: 3.9 mmol/L (ref 3.5–5.1)
Sodium: 138 mmol/L (ref 135–145)

## 2021-03-22 MED ORDER — HEPARIN SOD (PORK) LOCK FLUSH 100 UNIT/ML IV SOLN
500.0000 [IU] | INTRAVENOUS | Status: AC | PRN
  Administered 2021-03-22: 500 [IU]
  Filled 2021-03-22: qty 5

## 2021-03-22 MED ORDER — COLCHICINE 0.6 MG PO TABS: 0.6000 mg | ORAL_TABLET | Freq: Two times a day (BID) | ORAL | 2 refills | Status: DC

## 2021-03-22 MED ORDER — SUCRALFATE 1 GM/10ML PO SUSP: 1.0000 g | Freq: Four times a day (QID) | ORAL | 0 refills | Status: DC

## 2021-03-22 MED ORDER — SPIRONOLACTONE 25 MG PO TABS: 25.0000 mg | ORAL_TABLET | Freq: Every day | ORAL | 2 refills | Status: DC

## 2021-03-22 MED ORDER — INDOMETHACIN ER 75 MG PO CPCR: 75.0000 mg | ORAL_CAPSULE | Freq: Two times a day (BID) | ORAL | 0 refills | Status: DC

## 2021-03-22 NOTE — Progress Notes (Signed)
Pt got discharged to home, discharge instructions provided and patient showed understanding to it, IV taken out,Telemonitor DC,pt left unit in wheelchair with all of the belongings accompanied with a friend. Medicines returned from Pharmacy to the pt.  Palma Holter, RN

## 2021-03-22 NOTE — Plan of Care (Signed)

## 2021-03-22 NOTE — Progress Notes (Signed)
     [address]       [place] [address]             [phone]       Feels better now that the chest tube is out  Vitals:   03/22/21 0730 03/22/21 0825  BP: (!) 124/91   Pulse: 90 90  Resp: 16 14  Temp: 98.1 F (36.7 C)   SpO2: 98% 98%   Alert NAD Sinus EWOB  55 yo female s/p R RATS pericardial window Will obtain CXR today Clear for discharge from a surgical standpoint  [name]

## 2021-03-22 NOTE — Discharge Summary (Signed)
Physician Discharge Summary  Patient ID: [name] MRN: [id number] DOB/AGE: 1966/03/17 55 y.o. Hailey Mccreedy, MD   Admit date: 03/15/2021 Discharge date: 03/22/2021  Primary Discharge Diagnosis 1.  Acute idiopathic pericardial effusion, SP percutaneous thoracentesis followed by video-assisted thoracoscopy for placement of pericardial window on 03/17/2021. 2.  Primary hypertension 3.  Hypokalemia related to use of hydrochlorothiazide for diuresis and hypertension.  Do not suspect any other secondary etiology.   Significant Diagnostic Studies:  Cardiac Studies:  EKG 03/17/2021: Normal sinus rhythm at rate of 1 1 2  bpm scratch that sinus tachycardia at the rate of 102 bpm, normal axis.  Incomplete right bundle branch block.  Anteroseptal infarct old.  Low-voltage complexes.   Echocardiogram 03/17/2021:  1. Left ventricular ejection fraction, by estimation, is 60 to 65%. The  left ventricle has normal function.   2. Small circumferential pericardial effusion with mostly clear fluids  but loculated stranding on RV free wall. No tamponade.   3. The inferior vena cava is normal in size with <50% respiratory  variability, suggesting right atrial pressure of 8 mmHg.   4. Minimal increase in pericardial fluid compared to post  pericardiocentesis echcoardiogram images on 03/16/2021.   Echocardiogram 03/16/2021:  1. Left ventricular ejection fraction, by estimation, is 65 to 70%. The  left ventricle has normal function. The left ventricle has no regional  wall motion abnormalities. Left ventricular diastolic function could not  be evaluated.   2. Right ventricular systolic function is normal. The right ventricular  size is normal. There is mildly elevated pulmonary artery systolic  pressure.   3. Large circumferential pericardial effusion, reduced to trivial after  pericardiocentesis. No hemodynamic compromise seen after  pericardiocetnesis.   4. The mitral valve is grossly normal. No  evidence of mitral valve  regurgitation.   5. Tricuspid valve regurgitation is moderate.   6. The inferior vena cava is normal in size with greater than 50%  respiratory variability, suggesting right atrial pressure of 3 mmHg.  CARDIAC CATHETERIZATION Result Date: 03/16/2021 Images from the original result were not included. Successful pericardiocentesis 1590 cc serosanguinous fluid  Radiology:  [facility] Chest Port 1 View Result Date: 03/22/2021 CLINICAL DATA:  55 year old female with shortness of breath. Postoperative day 4 status post pericardial window. History of COVID-19. EXAM: PORTABLE CHEST 1 VIEW COMPARISON:  Portable chest 03/21/2021 and earlier. FINDINGS: Portable AP semi upright view at 1026 hours. Stable right chest power port, accessed. Lower thoracic spinal stimulator again noted. Mildly improved lung volumes. Mediastinal contours remain within normal limits. No pneumothorax or pulmonary edema. No pleural effusion. Streaky bibasilar pulmonary opacity has improved since 03/19/2021 and now most resembles atelectasis. No acute osseous abnormality identified. Negative visible bowel gas pattern. IMPRESSION: 1. Improving lung volumes and decreasing bibasilar atelectasis since 03/19/2021. 2. Satisfactory right chest Port-A-Cath. No new cardiopulmonary abnormality. Electronically Signed   By: Genevie Ann M.D.   On: 03/22/2021 10:45   Hospital Course: [name] is a 55 y.o. female  patient Caucasian female patient with history of severe major depression and prior suicide attempts, history of alcohol abuse, degenerative joint disease and chronic pain medications with oxycodone at home, admitted to the hospital on 03/09/2021 for massive pericardial effusion noted on echocardiogram in the outpatient basis.  She had 3 tamponade physiology on 2D echocardiogram, underwent elective pericardiocentesis in the cardiac catheterization lab on 03/16/2021.  However due to reaccumulation of fluid, she underwent pericardial  window on 03/17/2021 by video-assisted lateral thoracotomy.  She was kept until no further  drain was evident, chest tubes were all withdrawn yesterday and this morning she is doing well with complete expansion of her lungs, pain is well controlled, blood pressure is also well controlled on present medications.  Recommendations on discharge: After discussions with the patient, she is agreeable to taking indomethacin, I have discontinued aspirin 325 mg 3 times daily dose and continued indomethacin 75 mg p.o. twice daily for 4 weeks, she will also take Carafate 1 g every 6 hours along with Protonix.  She is also started on colchicine, she has an appointment for follow-up in the office.  She would like to return back to work, I left it to the patient to decide when she can return to work otherwise she could certainly return to work in about a week or so.  Discharge Exam: Vitals with BMI 03/22/2021 03/22/2021 03/22/2021  Height - - -  Weight - - 150 lbs 2 oz  BMI - - 02.58  Systolic - 527 782  Diastolic - 91 83  Pulse 90 90 91  Some encounter information is confidential and restricted. Go to Review Flowsheets activity to see all data.    Physical Exam Vitals reviewed.  Constitutional:      Appearance: Normal appearance.  Neck:     Vascular: No carotid bruit or JVD.  Cardiovascular:     Rate and Rhythm: Normal rate and regular rhythm.     Pulses: Intact distal pulses.     Heart sounds: Normal heart sounds. No murmur heard.   No gallop.  Pulmonary:     Effort: Pulmonary effort is normal.     Breath sounds: Normal breath sounds.  Abdominal:     General: Bowel sounds are normal.     Palpations: Abdomen is soft.  Musculoskeletal:        General: Normal range of motion.  Skin:    Capillary Refill: Capillary refill takes less than 2 seconds.  Neurological:     General: No focal deficit present.     Mental Status: She is alert.   Labs:   Lab Results  Component Value Date   WBC 10.5  03/20/2021   HGB 14.1 03/20/2021   HCT 44.6 03/20/2021   MCV 89.6 03/20/2021   PLT 430 (H) 03/20/2021    Recent Labs  Lab 03/20/21 0315 03/22/21 0138  NA 136 138  K 3.4* 3.9  CL 100 104  CO2 29 26  BUN 7 12  CREATININE 0.68 0.70  CALCIUM 9.0 9.0  PROT 6.3*  --   BILITOT 0.6  --   ALKPHOS 79  --   ALT 36  --   AST 44*  --   GLUCOSE 118* 134*    Lipid Panel     Component Value Date/Time   CHOL 75 03/16/2021 0525   TRIG 116 03/16/2021 0525   HDL 25 (L) 03/16/2021 0525   CHOLHDL 3.0 03/16/2021 0525   VLDL 23 03/16/2021 0525   LDLCALC 27 03/16/2021 0525    BNP (last 3 results) No results for input(s): BNP in the last 8760 hours.  HEMOGLOBIN A1C Lab Results  Component Value Date   HGBA1C 5.4 10/11/2014   MPG 108 10/11/2014    Cardiac Panel   Cardiac Panel (last 3 results) No results for input(s): CKTOTAL, CKMB, TROPONINIHS, RELINDX in the last 72 hours.   TSH No results for input(s): TSH in the last 8760 hours.  FOLLOW UP PLANS AND APPOINTMENTS  Allergies as of 03/22/2021  Reactions   Coconut Oil Rash   Doxycycline Anaphylaxis, Other (See Comments)   Joint damage also   Gabapentin    Other reaction(s): Abdominal Pain, GI Bleed   Imitrex [sumatriptan Base] Other (See Comments)   Severe hypertension, all triptans   Iodinated Diagnostic Agents Anaphylaxis   Lactose Diarrhea   Other reaction(s): GI Upset (intolerance)   Lidocaine Anaphylaxis   Pt states she does well with Marcaine/Bupivicaine without problem   Prednisone Other (See Comments)   She goes crazy   Sulfa Antibiotics Anaphylaxis   Sulfasalazine Anaphylaxis   Sumatriptan Other (See Comments)   Felt like she was "having a stroke", heart rate and blood pressure "went through the roof"   Tramadol Other (See Comments)   seizures   Levofloxacin Other (See Comments)   Joints hurt   Amoxicillin-pot Clavulanate Other (See Comments)   Pt can't remember what type of reaction she had, but  states her pharmacy has it listed in their allergy list.   Caffeine-sodium Benzoate    Other reaction(s): Dizziness (intolerance)   Other Other (See Comments)   Artificial sugar - migraine   Prochlorperazine Other (See Comments)   Tonic/clonic seizure   Latex Rash   Levofloxacin Rash, Other (See Comments)   Joints hurt   Metrizamide Other (See Comments)   Nsaids Rash, Other (See Comments)   GI bleed   Tolmetin Rash        Medication List     TAKE these medications    acetaminophen 500 MG tablet Commonly known as: TYLENOL Take 500-1,000 mg by mouth every 6 (six) hours as needed for moderate pain.   albuterol 108 (90 Base) MCG/ACT inhaler Commonly known as: VENTOLIN HFA Inhale 2 puffs into the lungs every 6 (six) hours as needed for wheezing or shortness of breath.   amitriptyline 25 MG tablet Commonly known as: ELAVIL Take 50 mg by mouth at bedtime.   amLODipine 10 MG tablet Commonly known as: NORVASC Take 5 mg by mouth daily.   budesonide-formoterol 160-4.5 MCG/ACT inhaler Commonly known as: SYMBICORT Inhale 2 puffs into the lungs daily as needed (wheezing & SOB).   busPIRone 30 MG tablet Commonly known as: BUSPAR Take 30 mg by mouth 2 (two) times daily.   colchicine 0.6 MG tablet Take 1 tablet (0.6 mg total) by mouth 2 (two) times daily.   diclofenac Sodium 1 % Gel Commonly known as: VOLTAREN Apply 2 g topically 4 (four) times daily as needed (pain).   hydrochlorothiazide 25 MG tablet Commonly known as: HYDRODIURIL Take 25 mg by mouth daily.   HYDROcodone-acetaminophen 5-325 MG tablet Commonly known as: NORCO/VICODIN Take 1 tablet by mouth every 4 (four) hours as needed for moderate pain.   indomethacin 75 MG CR capsule Commonly known as: INDOCIN SR Take 1 capsule (75 mg total) by mouth 2 (two) times daily with a meal.   methocarbamol 500 MG tablet Commonly known as: ROBAXIN Take 1,000 mg by mouth 3 (three) times daily as needed for muscle  spasms.   mirtazapine 15 MG tablet Commonly known as: REMERON Take 15 mg by mouth at bedtime.   multivitamin with minerals Tabs tablet Take 1 tablet by mouth daily.   ondansetron 4 MG disintegrating tablet Commonly known as: ZOFRAN-ODT Take 4 mg by mouth every 8 (eight) hours as needed for nausea or vomiting.   QUEtiapine 100 MG tablet Commonly known as: SEROQUEL Take 100 mg by mouth at bedtime.   rosuvastatin 20 MG tablet Commonly known as: CRESTOR Take  20 mg by mouth at bedtime.   spironolactone 25 MG tablet Commonly known as: ALDACTONE Take 1 tablet (25 mg total) by mouth daily. Start taking on: March 23, 2021   sucralfate 1 GM/10ML suspension Commonly known as: CARAFATE Take 10 mLs (1 g total) by mouth every 6 (six) hours.   topiramate 25 MG tablet Commonly known as: TOPAMAX Take 25 mg by mouth daily.        Follow-up Information     Alethia Berthold, PA-C Follow up on 03/26/2021.   Specialty: Cardiology Why: Echocardiogram at 1 PM Office visit at 2:00 PM Contact information: [place] [phone] [phone]         Lajuana Matte, MD. Go on 03/27/2021.   Specialty: Cardiothoracic Surgery Why: Appointment time is at 3:00 pm Contact information: [address] [place] [address] [place], MD, [facility] 03/22/2021, 11:11 AM Office: [phone]

## 2021-03-26 ENCOUNTER — Ambulatory Visit: Payer: Medicare Other

## 2021-03-26 ENCOUNTER — Other Ambulatory Visit: Payer: Self-pay

## 2021-03-26 ENCOUNTER — Encounter: Payer: Self-pay | Admitting: Student

## 2021-03-26 ENCOUNTER — Ambulatory Visit: Payer: Medicare Other | Admitting: Student

## 2021-03-26 VITALS — BP 118/77 | HR 97 | Temp 97.1°F | Resp 16 | Ht 62.0 in | Wt 152.6 lb

## 2021-03-26 DIAGNOSIS — I313 Pericardial effusion (noninflammatory): Secondary | ICD-10-CM

## 2021-03-26 DIAGNOSIS — I3139 Other pericardial effusion (noninflammatory): Secondary | ICD-10-CM

## 2021-03-26 NOTE — Progress Notes (Signed)
Primary Physician/Referring:  [name], MD  Patient ID: [name], female    DOB: [birth year], 55 y.o.   MRN: [id number]  Chief Complaint  Patient presents with   Hospitalization Follow-up   HPI:    [name]  is a 55 y.o. Caucasian female with history of family h/o CAD, personal h/o severe major depression, prior suicide attempts, history of alcohol abuse, h/o COVID (2021), with large pericardial effusion with echocardiographic signs of tamponade physiology on outpatient echocardiogram (03/12/2021).  She was therefore advised for elective admission to the hospital for pericardiocentesis and management of pericardial effusion.    Patient was admitted 03/15/2021 - 03/22/2021.  During hospitalization patient underwent elective pericardiocentesis and cardiac Cath Lab on 03/16/2021 however due to reaccumulation of fluid she underwent pericardial window on 03/17/2021 by video-assisted lateral thoracotomy.  Patient remained inpatient until no further drainage was evident and chest tubes were removed.  Patient was discharged on indomethacin and colchicine, advised she may return to work as tolerated. Suspect etiology is viral pericarditis.  Patient now presents for follow-up after discharge.  Upon further questioning patient admits she has not taken indomethacin or colchicine since discharge as she has not picked these medications up from the pharmacy.  She also notably is unsure whether or not she is taking spironolactone.  Patient is feeling quite well and has noticed significant improvement of dyspnea.  She does continue to have mild pain at incision sites but overall is feeling well.  She has returned to working full-time and admits to some fatigue, but this has slowly been improving.  Patient's blood pressure is soft in the office.  She denies chest pain, dyspnea, dizziness, lightheadedness, syncope, near syncope.  She is presently doing incentive spirometry 3 times daily at home.  She has  follow-up scheduled with Dr. Kipp Brood tomorrow.  Past Medical History:  Diagnosis Date   Anorexia    Anxiety    Depression    DJD (degenerative joint disease) of cervical spine    Endometriosis    ETOH abuse    sober 3 1/2 years   Hyperlipidemia    Hypertension    Migraines    PICC (peripherally inserted central catheter) in place    rt neck   Psychiatric pseudoseizure    Seizures ([place])    Past Surgical History:  Procedure Laterality Date   ABDOMINAL HYSTERECTOMY     ABDOMINAL SURGERY     APPENDECTOMY     CARPAL TUNNEL RELEASE     2010   CHOLECYSTECTOMY     ESOPHAGOGASTRODUODENOSCOPY N/A 10/06/2014   Procedure: ESOPHAGOGASTRODUODENOSCOPY (EGD);  Surgeon: Inda Castle, MD;  Location: [facility];  Service: Endoscopy;  Laterality: N/A;   KNEE SURGERY     laproscopy     NASAL SINUS SURGERY     PERICARDIOCENTESIS N/A 03/16/2021   Procedure: PERICARDIOCENTESIS;  Surgeon: Nigel Mormon, MD;  Location: [facility];  Service: Cardiovascular;  Laterality: N/A;   VIDEO ASSISTED THORACOSCOPY Right 03/18/2021   Procedure: VIDEO ASSISTED THORACOSCOPY FOR PERICARDIAL WINDOW;  Surgeon: Lajuana Matte, MD;  Location: [facility];  Service: Thoracic;  Laterality: Right;  pericardial window   Family History  Problem Relation Age of Onset   Hypertension Mother    Hyperlipidemia Mother    Heart failure Father    Heart attack Father    Cancer Other    Hypertension Half-Sister    Hyperlipidemia Half-Sister    Hypertension Half-Sister    Hyperlipidemia Half-Sister  Social History   Tobacco Use   Smoking status: Former    Packs/day: 0.15    Years: 20.00    Pack years: 3.00    Types: Cigarettes   Smokeless tobacco: Never  Substance Use Topics   Alcohol use: Not Currently   Marital Status: Single   ROS  Review of Systems  Constitutional: Negative for malaise/fatigue and weight gain.  Cardiovascular:  Positive for chest pain (chest wall pain at incision  sites). Negative for claudication, leg swelling, near-syncope, orthopnea, palpitations, paroxysmal nocturnal dyspnea and syncope.  Respiratory:  Negative for shortness of breath.   Neurological:  Negative for dizziness.   Objective  Blood pressure 118/77, pulse 97, temperature (!) 97.1 F (36.2 C), temperature source Temporal, resp. rate 16, height 5\' 2"  (1.575 m), weight 152 lb 9.6 oz (69.2 kg), SpO2 96 %.  Vitals with BMI 03/26/2021 03/22/2021 03/22/2021  Height 5\' 2"  - -  Weight 152 lbs 10 oz - -  BMI 09.3 - -  Systolic 235 573 -  Diastolic 77 80 -  Pulse 97 88 90  Some encounter information is confidential and restricted. Go to Review Flowsheets activity to see all data.      Physical Exam Vitals reviewed.  Cardiovascular:     Rate and Rhythm: Normal rate and regular rhythm.     Pulses: Intact distal pulses.     Heart sounds: S1 normal and S2 normal. No murmur heard.   No gallop.  Pulmonary:     Effort: Pulmonary effort is normal. No respiratory distress.     Breath sounds: No wheezing, rhonchi or rales.  Musculoskeletal:     Right lower leg: No edema.     Left lower leg: No edema.  Skin:    Comments: Incision sites healing well  Neurological:     Mental Status: She is alert.    Laboratory examination:   Recent Labs    03/19/21 0403 03/20/21 0315 03/22/21 0138  NA 136 136 138  K 4.5 3.4* 3.9  CL 104 100 104  CO2 26 29 26   GLUCOSE 121* 118* 134*  BUN 6 7 12   CREATININE 0.69 0.68 0.70  CALCIUM 9.1 9.0 9.0  GFRNONAA >60 >60 >60   estimated creatinine clearance is 73.2 mL/min (by C-G formula based on SCr of 0.7 mg/dL).  CMP Latest Ref Rng & Units 03/22/2021 03/20/2021 03/19/2021  Glucose 70 - 99 mg/dL 134(H) 118(H) 121(H)  BUN 6 - 20 mg/dL 12 7 6   Creatinine 0.44 - 1.00 mg/dL 0.70 0.68 0.69  Sodium 135 - 145 mmol/L 138 136 136  Potassium 3.5 - 5.1 mmol/L 3.9 3.4(L) 4.5  Chloride 98 - 111 mmol/L 104 100 104  CO2 22 - 32 mmol/L 26 29 26   Calcium 8.9 - 10.3 mg/dL 9.0  9.0 9.1  Total Protein 6.5 - 8.1 g/dL - 6.3(L) -  Total Bilirubin 0.3 - 1.2 mg/dL - 0.6 -  Alkaline Phos 38 - 126 U/L - 79 -  AST 15 - 41 U/L - 44(H) -  ALT 0 - 44 U/L - 36 -   CBC Latest Ref Rng & Units 03/20/2021 03/19/2021 03/16/2021  WBC 4.0 - 10.5 K/uL 10.5 11.6(H) 8.8  Hemoglobin 12.0 - 15.0 g/dL 14.1 13.8 11.9(L)  Hematocrit 36.0 - 46.0 % 44.6 44.1 37.6  Platelets 150 - 400 K/uL 430(H) 394 365    Lipid Panel Recent Labs    03/16/21 0525  CHOL 75  TRIG 116  LDLCALC 27  VLDL 23  HDL 25*  CHOLHDL 3.0    HEMOGLOBIN A1C Lab Results  Component Value Date   HGBA1C 5.4 10/11/2014   MPG 108 10/11/2014   TSH No results for input(s): TSH in the last 8760 hours.  External labs:   None  Allergies   Allergies  Allergen Reactions   Coconut Oil Rash   Doxycycline Anaphylaxis and Other (See Comments)    Joint damage also   Gabapentin     Other reaction(s): Abdominal Pain, GI Bleed   Imitrex [Sumatriptan Base] Other (See Comments)    Severe hypertension, all triptans   Iodinated Diagnostic Agents Anaphylaxis   Lactose Diarrhea    Other reaction(s): GI Upset (intolerance)   Lidocaine Anaphylaxis    Pt states she does well with Marcaine/Bupivicaine without problem   Prednisone Other (See Comments)    She goes crazy   Sulfa Antibiotics Anaphylaxis   Sulfasalazine Anaphylaxis   Sumatriptan Other (See Comments)    Felt like she was "having a stroke", heart rate and blood pressure "went through the roof"   Tramadol Other (See Comments)    seizures   Levofloxacin Other (See Comments)    Joints hurt   Amoxicillin-Pot Clavulanate Other (See Comments)    Pt can't remember what type of reaction she had, but states her pharmacy has it listed in their allergy list.   Caffeine-Sodium Benzoate     Other reaction(s): Dizziness (intolerance)   Other Other (See Comments)    Artificial sugar - migraine   Prochlorperazine Other (See Comments)    Tonic/clonic seizure   Latex Rash    Levofloxacin Rash and Other (See Comments)    Joints hurt   Metrizamide Other (See Comments)   Nsaids Rash and Other (See Comments)    GI bleed   Tolmetin Rash      Medications Prior to Visit:   Outpatient Medications Prior to Visit  Medication Sig Dispense Refill   acetaminophen (TYLENOL) 500 MG tablet Take 500-1,000 mg by mouth every 6 (six) hours as needed for moderate pain.     albuterol (VENTOLIN HFA) 108 (90 Base) MCG/ACT inhaler Inhale 2 puffs into the lungs every 6 (six) hours as needed for wheezing or shortness of breath.     amitriptyline (ELAVIL) 25 MG tablet Take 50 mg by mouth at bedtime.     amLODipine (NORVASC) 10 MG tablet Take 10 mg by mouth daily.     budesonide-formoterol (SYMBICORT) 160-4.5 MCG/ACT inhaler Inhale 2 puffs into the lungs daily as needed (wheezing & SOB).     busPIRone (BUSPAR) 30 MG tablet Take 30 mg by mouth 2 (two) times daily.     diclofenac Sodium (VOLTAREN) 1 % GEL Apply 2 g topically 4 (four) times daily as needed (pain).     hydrochlorothiazide (HYDRODIURIL) 25 MG tablet Take 25 mg by mouth daily.     HYDROcodone-acetaminophen (NORCO/VICODIN) 5-325 MG tablet Take 1 tablet by mouth every 4 (four) hours as needed for moderate pain.     methocarbamol (ROBAXIN) 500 MG tablet Take 1,000 mg by mouth 3 (three) times daily as needed for muscle spasms.     mirtazapine (REMERON) 15 MG tablet Take 15 mg by mouth at bedtime.     Multiple Vitamin (MULTIVITAMIN WITH MINERALS) TABS tablet Take 1 tablet by mouth daily.     ondansetron (ZOFRAN-ODT) 4 MG disintegrating tablet Take 4 mg by mouth every 8 (eight) hours as needed for nausea or vomiting.     QUEtiapine (SEROQUEL) 100 MG tablet Take  100 mg by mouth at bedtime.     rosuvastatin (CRESTOR) 20 MG tablet Take 20 mg by mouth at bedtime.     sucralfate (CARAFATE) 1 GM/10ML suspension Take 10 mLs (1 g total) by mouth every 6 (six) hours. 420 mL 0   topiramate (TOPAMAX) 25 MG tablet Take 25 mg by mouth daily.      colchicine 0.6 MG tablet Take 1 tablet (0.6 mg total) by mouth 2 (two) times daily. (Patient not taking: Reported on 03/26/2021) 60 tablet 2   indomethacin (INDOCIN SR) 75 MG CR capsule Take 1 capsule (75 mg total) by mouth 2 (two) times daily with a meal. (Patient not taking: Reported on 03/26/2021) 60 capsule 0   spironolactone (ALDACTONE) 25 MG tablet Take 1 tablet (25 mg total) by mouth daily. (Patient not taking: Reported on 03/26/2021) 30 tablet 2   No facility-administered medications prior to visit.     Final Medications at End of Visit    Current Meds  Medication Sig   acetaminophen (TYLENOL) 500 MG tablet Take 500-1,000 mg by mouth every 6 (six) hours as needed for moderate pain.   albuterol (VENTOLIN HFA) 108 (90 Base) MCG/ACT inhaler Inhale 2 puffs into the lungs every 6 (six) hours as needed for wheezing or shortness of breath.   amitriptyline (ELAVIL) 25 MG tablet Take 50 mg by mouth at bedtime.   amLODipine (NORVASC) 10 MG tablet Take 10 mg by mouth daily.   budesonide-formoterol (SYMBICORT) 160-4.5 MCG/ACT inhaler Inhale 2 puffs into the lungs daily as needed (wheezing & SOB).   busPIRone (BUSPAR) 30 MG tablet Take 30 mg by mouth 2 (two) times daily.   diclofenac Sodium (VOLTAREN) 1 % GEL Apply 2 g topically 4 (four) times daily as needed (pain).   hydrochlorothiazide (HYDRODIURIL) 25 MG tablet Take 25 mg by mouth daily.   HYDROcodone-acetaminophen (NORCO/VICODIN) 5-325 MG tablet Take 1 tablet by mouth every 4 (four) hours as needed for moderate pain.   methocarbamol (ROBAXIN) 500 MG tablet Take 1,000 mg by mouth 3 (three) times daily as needed for muscle spasms.   mirtazapine (REMERON) 15 MG tablet Take 15 mg by mouth at bedtime.   Multiple Vitamin (MULTIVITAMIN WITH MINERALS) TABS tablet Take 1 tablet by mouth daily.   ondansetron (ZOFRAN-ODT) 4 MG disintegrating tablet Take 4 mg by mouth every 8 (eight) hours as needed for nausea or vomiting.   QUEtiapine (SEROQUEL) 100 MG  tablet Take 100 mg by mouth at bedtime.   rosuvastatin (CRESTOR) 20 MG tablet Take 20 mg by mouth at bedtime.   sucralfate (CARAFATE) 1 GM/10ML suspension Take 10 mLs (1 g total) by mouth every 6 (six) hours.   topiramate (TOPAMAX) 25 MG tablet Take 25 mg by mouth daily.     Radiology:   No results found. DG Chest Port 1 View Result Date: 03/22/2021  Portable AP semi upright view at 1026 hours. Stable right chest power port, accessed. Lower thoracic spinal stimulator again noted. Mildly improved lung volumes. Mediastinal contours remain within normal limits. No pneumothorax or pulmonary edema. No pleural effusion. Streaky bibasilar pulmonary opacity has improved since 03/19/2021 and now most resembles atelectasis. No acute osseous abnormality identified. Negative visible bowel gas pattern.   IMPRESSION:  1. Improving lung volumes and decreasing bibasilar atelectasis since 03/19/2021.  2. Satisfactory right chest Port-A-Cath. No new cardiopulmonary abnormality.  Cardiac Studies:   Echocardiogram 03/16/2021:  1. Left ventricular ejection fraction, by estimation, is 65 to 70%. The  left ventricle has normal  function. The left ventricle has no regional  wall motion abnormalities. Left ventricular diastolic function could not  be evaluated.   2. Right ventricular systolic function is normal. The right ventricular  size is normal. There is mildly elevated pulmonary artery systolic  pressure.   3. Large circumferential pericardial effusion, reduced to trivial after  pericardiocentesis. No hemodynamic compromise seen after  pericardiocetnesis.   4. The mitral valve is grossly normal. No evidence of mitral valve  regurgitation.   5. Tricuspid valve regurgitation is moderate.   6. The inferior vena cava is normal in size with greater than 50%  respiratory variability, suggesting right atrial pressure of 3 mmHg.   CARDIAC CATHETERIZATION 03/16/2021 Images from the original result were not  included. Successful pericardiocentesis 1590 cc serosanguinous fluid  Pericardial window by video-assisted thoracoscopy 03/17/2021  Echocardiogram 03/17/2021:  1. Left ventricular ejection fraction, by estimation, is 60 to 65%. The  left ventricle has normal function.   2. Small circumferential pericardial effusion with mostly clear fluids  but loculated stranding on RV free wall. No tamponade.   3. The inferior vena cava is normal in size with <50% respiratory  variability, suggesting right atrial pressure of 8 mmHg.   4. Minimal increase in pericardial fluid compared to post  pericardiocentesis echcoardiogram images on 03/16/2021.  EKG:   03/17/2021: Normal sinus rhythm at rate of 1 1 2  bpm scratch that sinus tachycardia at the rate of 102 bpm, normal axis.  Incomplete right bundle branch block.  Anteroseptal infarct old.  Low-voltage complexes.  Assessment     ICD-10-CM   1. Idiopathic pericardial effusion - s/p percardial window 03/17/21  I31.3 DG Chest 2 View       There are no discontinued medications.  No orders of the defined types were placed in this encounter.   Recommendations:   [name] is a 55 y.o. Caucasian female patient with history of severe major depression and prior suicide attempts, history of alcohol abuse, degenerative joint disease and chronic pain medications with oxycodone at home, admitted to the hospital on 03/15/2021 for massive pericardial effusion noted on echocardiogram in the outpatient basis.  Subsequently underwent elective pericardiocentesis followed by pericardial window on 03/17/2021 due to fluid reaccumulation.  Patient is feeling much improved since discharge from the hospital.  She has noticed significant improvement of dyspnea.  Her only complaint today is mild chest wall pain at incision sites.  Patient has not yet started indomethacin or colchicine.  Given patient's history of GI bleeds with NSAIDs and significant improvement of symptoms we will  hold off on initiation of indomethacin.  We will obtain chest x-ray and consider starting indomethacin if there is significant fluid accumulation.  Advised patient to be sure to start colchicine.  In regard to blood pressure, her blood pressure is soft in the office today although patient is asymptomatic.  She is unsure if she is taking spironolactone combination with amlodipine and hydrochlorothiazide.  Patient will carry her medications at home and notify the office whether or not she is taking spironolactone.  If she is not taking advised patient to hold off on initiating this given her soft blood pressure.  Further recommendations from guarding spironolactone pending patient phone call with medication reconciliation.  Follow-up in 3 months, sooner if needed, for pericardial effusion status post Pyreddy window.  Patient was seen in collaboration with Dr. Virgina Jock. He also reviewed patient's chart and examined the patient. Dr. Virgina Jock is in agreement of the plan.   During this  visit I reviewed and updated: Tobacco history  allergies medication reconciliation  medical history  surgical history  family history  social history.  This note was created using a voice recognition software as a result there may be grammatical errors inadvertently enclosed that do not reflect the nature of this encounter. Every attempt is made to correct such errors.   Alethia Berthold, PA-C 03/26/2021, 2:54 PM Office: [phone]

## 2021-03-27 ENCOUNTER — Ambulatory Visit (INDEPENDENT_AMBULATORY_CARE_PROVIDER_SITE_OTHER): Payer: Self-pay | Admitting: Thoracic Surgery (Cardiothoracic Vascular Surgery)

## 2021-03-27 VITALS — BP 126/80 | HR 100 | Resp 20 | Ht 62.0 in | Wt 152.0 lb

## 2021-03-27 DIAGNOSIS — Z9889 Other specified postprocedural states: Secondary | ICD-10-CM

## 2021-03-27 NOTE — Progress Notes (Signed)
      [address]       [place] [address]             [phone]        Carrissa Posa [facility] Medical Record #[id number] Date of Birth: 05/21/1966  Referring: Nigel Mormon, MD Primary Care: [name], MD Primary Cardiologist:None  Reason for visit:   follow-up  History of Present Illness:     Ms. Heeg is doing well.  She return to work on Tuesday.  She denied any significant pain.  There is some's small incisional pain that she experienced when she attempted to lift something heavy.  Physical Exam: BP 126/80 (BP Location: Right Arm, Patient Position: Sitting)   Pulse 100   Resp 20   Ht 5\' 2"  (1.575 m)   Wt 152 lb (68.9 kg)   SpO2 95% Comment: RA  BMI 27.80 kg/m   Alert NAD Incision clean.  Stitch removed Abdomen soft, ND No peripheral edema   Diagnostic Studies & Laboratory data:  Path: Pericardium demonstrated pericarditis.     Assessment / Plan:   55 year old female status post thoracoscopic pericardial window.  Biopsy of the pericardium only revealed pericarditis.  I will see her back in 1 month with a chest x-ray.   Lajuana Matte 03/27/2021 3:21 PM

## 2021-03-27 NOTE — Telephone Encounter (Signed)
From pt

## 2021-04-02 NOTE — Telephone Encounter (Signed)
No

## 2021-04-10 ENCOUNTER — Telehealth: Payer: Self-pay | Admitting: Student

## 2021-04-10 DIAGNOSIS — I313 Pericardial effusion (noninflammatory): Secondary | ICD-10-CM

## 2021-04-10 DIAGNOSIS — I3139 Other pericardial effusion (noninflammatory): Secondary | ICD-10-CM

## 2021-04-10 MED ORDER — INDOMETHACIN ER 75 MG PO CPCR: 75.0000 mg | ORAL_CAPSULE | Freq: Two times a day (BID) | ORAL | 3 refills | Status: DC

## 2021-04-10 NOTE — Telephone Encounter (Signed)
Left-sided chest pain over the last 2 days pleuritic in nature and associated with shortness of breath.  Discussed case with Dr. Virgina Jock who recommends patient's start indomethacin 75 mg twice daily with meals, obtain previously ordered chest x-ray, and schedule for an office follow-up.

## 2021-04-13 ENCOUNTER — Other Ambulatory Visit: Payer: Self-pay

## 2021-04-13 MED ORDER — INDOMETHACIN ER 75 MG PO CPCR: 75.0000 mg | ORAL_CAPSULE | Freq: Two times a day (BID) | ORAL | 3 refills | Status: DC

## 2021-04-13 NOTE — Telephone Encounter (Signed)
She never had the one MP or I ordered according to my records. She has had change in clinical status and needs one repeated, even if she had one done.

## 2021-04-16 ENCOUNTER — Ambulatory Visit: Payer: Medicare Other | Admitting: Cardiology

## 2021-04-16 NOTE — Telephone Encounter (Signed)
From patient.

## 2021-04-17 NOTE — Telephone Encounter (Signed)
Called and spoke with patient. She refuses to take NSAIDs due to history of GI bleeding. Denies shortness of breath. I also discussed the case with Dr. Virgina Jock. Suspect pain is post-operative as well as may be due to underlying pericarditis. Will obtain chest x-ray as previously ordered. Could consider limited echo if SOB develops. Counseled patient regarding signs and symptoms that would warrant more urgent evaluation, patient verbalized understanding. At this time will continue colchicine and continue to monitor.

## 2021-04-27 ENCOUNTER — Ambulatory Visit: Payer: Medicare Other | Admitting: Student

## 2021-04-27 NOTE — Progress Notes (Deleted)
Primary Physician/Referring:  [name], MD  Patient ID: [name], female    DOB: 24-Jul-1966, 55 y.o.   MRN: [id number]  No chief complaint on file.  HPI:    [name]  is a 55 y.o. Caucasian female with history of family h/o CAD, personal h/o severe major depression, prior suicide attempts, history of alcohol abuse, h/o COVID (2021), with large pericardial effusion with echocardiographic signs of tamponade physiology on outpatient echocardiogram (03/12/2021).  She was therefore advised for elective admission to the hospital for pericardiocentesis and management of pericardial effusion.    Patient was admitted 03/15/2021 - 03/22/2021.  During hospitalization patient underwent elective pericardiocentesis and cardiac Cath Lab on 03/16/2021 however due to reaccumulation of fluid she underwent pericardial window on 03/17/2021 by video-assisted lateral thoracotomy. Suspect etiology is viral pericarditis.  Patient has been taking colchicine, however due to history of GI bleeds with NSAID use she has never started indomethacin.  Patient is called the office multiple times with continued chest discomfort, however no shortness of breath.***  ***No chest x-ray done. Indomethocin? Pain? SOB?    Patient remained inpatient until no further drainage was evident and chest tubes were removed.  Patient was discharged on indomethacin and colchicine, advised she may return to work as tolerated.   Patient now presents for follow-up after discharge.  Upon further questioning patient admits she has not taken indomethacin or colchicine since discharge as she has not picked these medications up from the pharmacy.  She also notably is unsure whether or not she is taking spironolactone.  Patient is feeling quite well and has noticed significant improvement of dyspnea.  She does continue to have mild pain at incision sites but overall is feeling well.  She has returned to working full-time and admits to some  fatigue, but this has slowly been improving.  Patient's blood pressure is soft in the office.  She denies chest pain, dyspnea, dizziness, lightheadedness, syncope, near syncope.  She is presently doing incentive spirometry 3 times daily at home.  She has follow-up scheduled with Dr. Kipp Brood tomorrow.  Past Medical History:  Diagnosis Date   Anorexia    Anxiety    Depression    DJD (degenerative joint disease) of cervical spine    Endometriosis    ETOH abuse    sober 3 1/2 years   Hyperlipidemia    Hypertension    Migraines    PICC (peripherally inserted central catheter) in place    rt neck   Psychiatric pseudoseizure    Seizures ([place])    Past Surgical History:  Procedure Laterality Date   ABDOMINAL HYSTERECTOMY     ABDOMINAL SURGERY     APPENDECTOMY     CARPAL TUNNEL RELEASE     2010   CHOLECYSTECTOMY     ESOPHAGOGASTRODUODENOSCOPY N/A 10/06/2014   Procedure: ESOPHAGOGASTRODUODENOSCOPY (EGD);  Surgeon: Inda Castle, MD;  Location: [facility];  Service: Endoscopy;  Laterality: N/A;   KNEE SURGERY     laproscopy     NASAL SINUS SURGERY     PERICARDIOCENTESIS N/A 03/16/2021   Procedure: PERICARDIOCENTESIS;  Surgeon: Nigel Mormon, MD;  Location: [facility];  Service: Cardiovascular;  Laterality: N/A;   VIDEO ASSISTED THORACOSCOPY Right 03/18/2021   Procedure: VIDEO ASSISTED THORACOSCOPY FOR PERICARDIAL WINDOW;  Surgeon: Lajuana Matte, MD;  Location: [facility];  Service: Thoracic;  Laterality: Right;  pericardial window   Family History  Problem Relation Age of Onset   Hypertension Mother  Hyperlipidemia Mother    Heart failure Father    Heart attack Father    Cancer Other    Hypertension Half-Sister    Hyperlipidemia Half-Sister    Hypertension Half-Sister    Hyperlipidemia Half-Sister     Social History   Tobacco Use   Smoking status: Former    Packs/day: 0.15    Years: 20.00    Pack years: 3.00    Types: Cigarettes   Smokeless tobacco:  Never  Substance Use Topics   Alcohol use: Not Currently   Marital Status: Single   ROS  Review of Systems  Constitutional: Negative for malaise/fatigue and weight gain.  Cardiovascular:  Positive for chest pain (chest wall pain at incision sites). Negative for claudication, leg swelling, near-syncope, orthopnea, palpitations, paroxysmal nocturnal dyspnea and syncope.  Respiratory:  Negative for shortness of breath.   Neurological:  Negative for dizziness.   Objective  There were no vitals taken for this visit.  Vitals with BMI 03/27/2021 03/26/2021 03/22/2021  Height '5\' 2"'$  '5\' 2"'$  -  Weight 152 lbs 152 lbs 10 oz -  BMI [id number] -  Systolic [id number]  Diastolic 80 77 80  Pulse [id number] 97 88  Some encounter information is confidential and restricted. Go to Review Flowsheets activity to see all data.      Physical Exam Vitals reviewed.  Cardiovascular:     Rate and Rhythm: Normal rate and regular rhythm.     Pulses: Intact distal pulses.     Heart sounds: S1 normal and S2 normal. No murmur heard.   No gallop.  Pulmonary:     Effort: Pulmonary effort is normal. No respiratory distress.     Breath sounds: No wheezing, rhonchi or rales.  Musculoskeletal:     Right lower leg: No edema.     Left lower leg: No edema.  Skin:    Comments: Incision sites healing well  Neurological:     Mental Status: She is alert.    Laboratory examination:   Recent Labs    03/19/21 0403 03/20/21 0315 03/22/21 0138  NA 136 136 138  K 4.5 3.4* 3.9  CL 104 100 104  CO2 '26 29 26  '$ GLUCOSE 121* 118* 134*  BUN '6 7 12  '$ CREATININE 0.69 0.68 0.70  CALCIUM 9.1 9.0 9.0  GFRNONAA >60 >60 >60    CrCl cannot be calculated (Patient's most recent lab result is older than the maximum 21 days allowed.).  CMP Latest Ref Rng & Units 03/22/2021 03/20/2021 03/19/2021  Glucose 70 - 99 mg/dL 134(H) 118(H) 121(H)  BUN 6 - 20 mg/dL '12 7 6  '$ Creatinine 0.44 - 1.00 mg/dL 0.70 0.68 0.69  Sodium 135 - 145 mmol/L 138 136  136  Potassium 3.5 - 5.1 mmol/L 3.9 3.4(L) 4.5  Chloride 98 - 111 mmol/L 104 100 104  CO2 22 - 32 mmol/L '26 29 26  '$ Calcium 8.9 - 10.3 mg/dL 9.0 9.0 9.1  Total Protein 6.5 - 8.1 g/dL - 6.3(L) -  Total Bilirubin 0.3 - 1.2 mg/dL - 0.6 -  Alkaline Phos 38 - 126 U/L - 79 -  AST 15 - 41 U/L - 44(H) -  ALT 0 - 44 U/L - 36 -   CBC Latest Ref Rng & Units 03/20/2021 03/19/2021 03/16/2021  WBC 4.0 - 10.5 K/uL 10.5 11.6(H) 8.8  Hemoglobin 12.0 - 15.0 g/dL 14.1 13.8 11.9(L)  Hematocrit 36.0 - 46.0 % 44.6 44.1 37.6  Platelets 150 - 400 K/uL 430(H) 394 365  Lipid Panel Recent Labs    03/16/21 0525  CHOL 75  TRIG 116  LDLCALC 27  VLDL 23  HDL 25*  CHOLHDL 3.0     HEMOGLOBIN A1C Lab Results  Component Value Date   HGBA1C 5.4 10/11/2014   MPG 108 10/11/2014   TSH No results for input(s): TSH in the last 8760 hours.  External labs:   None  Allergies   Allergies  Allergen Reactions   Coconut Oil Rash   Doxycycline Anaphylaxis and Other (See Comments)    Joint damage also   Gabapentin     Other reaction(s): Abdominal Pain, GI Bleed   Imitrex [Sumatriptan Base] Other (See Comments)    Severe hypertension, all triptans   Iodinated Diagnostic Agents Anaphylaxis   Lactose Diarrhea    Other reaction(s): GI Upset (intolerance)   Lidocaine Anaphylaxis    Pt states she does well with Marcaine/Bupivicaine without problem   Prednisone Other (See Comments)    She goes crazy   Sulfa Antibiotics Anaphylaxis   Sulfasalazine Anaphylaxis   Sumatriptan Other (See Comments)    Felt like she was "having a stroke", heart rate and blood pressure "went through the roof"   Tramadol Other (See Comments)    seizures   Levofloxacin Other (See Comments)    Joints hurt   Amoxicillin-Pot Clavulanate Other (See Comments)    Pt can't remember what type of reaction she had, but states her pharmacy has it listed in their allergy list.   Caffeine-Sodium Benzoate     Other reaction(s): Dizziness  (intolerance)   Other Other (See Comments)    Artificial sugar - migraine   Prochlorperazine Other (See Comments)    Tonic/clonic seizure   Latex Rash   Levofloxacin Rash and Other (See Comments)    Joints hurt   Metrizamide Other (See Comments)   Nsaids Rash and Other (See Comments)    GI bleed   Tolmetin Rash      Medications Prior to Visit:   Outpatient Medications Prior to Visit  Medication Sig Dispense Refill   acetaminophen (TYLENOL) 500 MG tablet Take 500-1,000 mg by mouth every 6 (six) hours as needed for moderate pain.     albuterol (VENTOLIN HFA) 108 (90 Base) MCG/ACT inhaler Inhale 2 puffs into the lungs every 6 (six) hours as needed for wheezing or shortness of breath.     amitriptyline (ELAVIL) 25 MG tablet Take 50 mg by mouth at bedtime.     amLODipine (NORVASC) 10 MG tablet Take 10 mg by mouth daily.     busPIRone (BUSPAR) 30 MG tablet Take 30 mg by mouth 2 (two) times daily.     colchicine 0.6 MG tablet Take 1 tablet (0.6 mg total) by mouth 2 (two) times daily. 60 tablet 2   diclofenac Sodium (VOLTAREN) 1 % GEL Apply 2 g topically 4 (four) times daily as needed (pain).     hydrochlorothiazide (HYDRODIURIL) 25 MG tablet Take 25 mg by mouth daily.     HYDROcodone-acetaminophen (NORCO/VICODIN) 5-325 MG tablet Take 1 tablet by mouth every 4 (four) hours as needed for moderate pain.     indomethacin (INDOCIN SR) 75 MG CR capsule Take 1 capsule (75 mg total) by mouth 2 (two) times daily with a meal. 60 capsule 3   methocarbamol (ROBAXIN) 500 MG tablet Take 1,000 mg by mouth 3 (three) times daily as needed for muscle spasms.     mirtazapine (REMERON) 15 MG tablet Take 15 mg by mouth at bedtime.  Multiple Vitamin (MULTIVITAMIN WITH MINERALS) TABS tablet Take 1 tablet by mouth daily.     ondansetron (ZOFRAN-ODT) 4 MG disintegrating tablet Take 4 mg by mouth every 8 (eight) hours as needed for nausea or vomiting.     QUEtiapine (SEROQUEL) 100 MG tablet Take 100 mg by mouth  at bedtime.     rosuvastatin (CRESTOR) 20 MG tablet Take 20 mg by mouth at bedtime.     topiramate (TOPAMAX) 25 MG tablet Take 25 mg by mouth daily.     No facility-administered medications prior to visit.     Final Medications at End of Visit    No outpatient medications have been marked as taking for the 04/27/21 encounter (Appointment) with Rayetta Pigg, Harjas Biggins C, PA-C.     Radiology:   No results found. DG Chest Port 1 View Result Date: 03/22/2021  Portable AP semi upright view at 1026 hours. Stable right chest power port, accessed. Lower thoracic spinal stimulator again noted. Mildly improved lung volumes. Mediastinal contours remain within normal limits. No pneumothorax or pulmonary edema. No pleural effusion. Streaky bibasilar pulmonary opacity has improved since 03/19/2021 and now most resembles atelectasis. No acute osseous abnormality identified. Negative visible bowel gas pattern.   IMPRESSION:  1. Improving lung volumes and decreasing bibasilar atelectasis since 03/19/2021.  2. Satisfactory right chest Port-A-Cath. No new cardiopulmonary abnormality.  Cardiac Studies:   Echocardiogram 03/16/2021:  1. Left ventricular ejection fraction, by estimation, is 65 to 70%. The  left ventricle has normal function. The left ventricle has no regional  wall motion abnormalities. Left ventricular diastolic function could not  be evaluated.   2. Right ventricular systolic function is normal. The right ventricular  size is normal. There is mildly elevated pulmonary artery systolic  pressure.   3. Large circumferential pericardial effusion, reduced to trivial after  pericardiocentesis. No hemodynamic compromise seen after  pericardiocetnesis.   4. The mitral valve is grossly normal. No evidence of mitral valve  regurgitation.   5. Tricuspid valve regurgitation is moderate.   6. The inferior vena cava is normal in size with greater than 50%  respiratory variability, suggesting right atrial  pressure of 3 mmHg.   CARDIAC CATHETERIZATION 03/16/2021 Images from the original result were not included. Successful pericardiocentesis 1590 cc serosanguinous fluid  Pericardial window by video-assisted thoracoscopy 03/17/2021  Echocardiogram 03/17/2021:  1. Left ventricular ejection fraction, by estimation, is 60 to 65%. The  left ventricle has normal function.   2. Small circumferential pericardial effusion with mostly clear fluids  but loculated stranding on RV free wall. No tamponade.   3. The inferior vena cava is normal in size with <50% respiratory  variability, suggesting right atrial pressure of 8 mmHg.   4. Minimal increase in pericardial fluid compared to post  pericardiocentesis echcoardiogram images on 03/16/2021.  EKG:   03/17/2021: Normal sinus rhythm at rate of '1 1 2 '$ bpm scratch that sinus tachycardia at the rate of 102 bpm, normal axis.  Incomplete right bundle branch block.  Anteroseptal infarct old.  Low-voltage complexes.  Assessment   No diagnosis found.    There are no discontinued medications.  No orders of the defined types were placed in this encounter.   Recommendations:   [name] is a 55 y.o. Caucasian female with history of family h/o CAD, personal h/o severe major depression, prior suicide attempts, history of alcohol abuse, h/o COVID (2021), with large pericardial effusion with echocardiographic signs of tamponade physiology on outpatient echocardiogram (03/12/2021).  She was therefore advised  for elective admission to the hospital for pericardiocentesis and management of pericardial effusion.    Patient was admitted 03/15/2021 - 03/22/2021.  During hospitalization patient underwent elective pericardiocentesis and cardiac Cath Lab on 03/16/2021 however due to reaccumulation of fluid she underwent pericardial window on 03/17/2021 by video-assisted lateral thoracotomy. Suspect etiology is viral pericarditis.  Patient has been taking colchicine, however due to  history of GI bleeds with NSAID use she has never started indomethacin.  Patient is called the office multiple times with continued chest discomfort, however no shortness of breath.***  ***No chest x-ray done. Indomethocin? Pain? SOB?   ***  Patient is feeling much improved since discharge from the hospital.  She has noticed significant improvement of dyspnea.  Her only complaint today is mild chest wall pain at incision sites.  Patient has not yet started indomethacin or colchicine.  Given patient's history of GI bleeds with NSAIDs and significant improvement of symptoms we will hold off on initiation of indomethacin.  We will obtain chest x-ray and consider starting indomethacin if there is significant fluid accumulation.  Advised patient to be sure to start colchicine.  In regard to blood pressure, her blood pressure is soft in the office today although patient is asymptomatic.  She is unsure if she is taking spironolactone combination with amlodipine and hydrochlorothiazide.  Patient will carry her medications at home and notify the office whether or not she is taking spironolactone.  If she is not taking advised patient to hold off on initiating this given her soft blood pressure.  Further recommendations from guarding spironolactone pending patient phone call with medication reconciliation.  Follow-up in 3 months, sooner if needed, for pericardial effusion status post Pyreddy window.  Patient was seen in collaboration with Dr. Virgina Jock. He also reviewed patient's chart and examined the patient. Dr. Virgina Jock is in agreement of the plan.   During this visit I reviewed and updated: Tobacco history  allergies medication reconciliation  medical history  surgical history  family history  social history.  This note was created using a voice recognition software as a result there may be grammatical errors inadvertently enclosed that do not reflect the nature of this encounter. Every attempt  is made to correct such errors.   Alethia Berthold, PA-C 04/27/2021, 10:00 AM Office: [phone]

## 2021-04-29 ENCOUNTER — Other Ambulatory Visit: Payer: Self-pay | Admitting: Thoracic Surgery (Cardiothoracic Vascular Surgery)

## 2021-04-29 DIAGNOSIS — I313 Pericardial effusion (noninflammatory): Secondary | ICD-10-CM

## 2021-04-29 DIAGNOSIS — I3139 Other pericardial effusion (noninflammatory): Secondary | ICD-10-CM

## 2021-05-01 ENCOUNTER — Ambulatory Visit: Payer: Self-pay | Admitting: Thoracic Surgery (Cardiothoracic Vascular Surgery)

## 2021-06-25 NOTE — Progress Notes (Signed)
Primary Physician/Referring:  [name], MD  Patient ID: [name], female    DOB: 1966/03/08, 55 y.o.   MRN: [id number]  Chief Complaint  Patient presents with   pericarditis   Follow-up   HPI:    [name]  is a 55 y.o. Caucasian female with history of family h/o CAD, personal h/o severe major depression, prior suicide attempts, history of alcohol abuse, h/o COVID (2021), with large pericardial effusion with echocardiographic signs of tamponade physiology on outpatient echocardiogram (03/12/2021).  She was therefore advised for elective admission to the hospital for pericardiocentesis and management of pericardial effusion.    Patient was admitted 03/15/2021 - 03/22/2021.  During hospitalization patient underwent elective pericardiocentesis and cardiac Cath Lab on 03/16/2021 however due to reaccumulation of fluid she underwent pericardial window on 03/17/2021 by video-assisted lateral thoracotomy.  Patient remained inpatient until no further drainage was evident and chest tubes were removed.  Patient was discharged on indomethacin and colchicine.  However she never began indomethacin due to concerns over history of GI bleeding.  Patient presents for 64-month follow-up.  Since last office visit patient has discontinued Crestor as her lipid profile testing in June 2022 revealed lipids were under excellent control.  Patient wishes to remain off statin therapy at this time, however she does have repeat lipid profile testing scheduled for next month with PCP.  Patient is also inadvertently discontinued spironolactone.  Overall patient is feeling well.  She has had no shortness of breath or chest pain recently.   She denies chest pain, dyspnea, dizziness, lightheadedness, syncope, near syncope.  Past Medical History:  Diagnosis Date   Anorexia    Anxiety    Depression    DJD (degenerative joint disease) of cervical spine    Endometriosis    ETOH abuse    sober 3 1/2 years    Hyperlipidemia    Hypertension    Migraines    PICC (peripherally inserted central catheter) in place    rt neck   Psychiatric pseudoseizure    Seizures ([place])    Past Surgical History:  Procedure Laterality Date   ABDOMINAL HYSTERECTOMY     ABDOMINAL SURGERY     APPENDECTOMY     CARPAL TUNNEL RELEASE     2010   CHOLECYSTECTOMY     ESOPHAGOGASTRODUODENOSCOPY N/A 10/06/2014   Procedure: ESOPHAGOGASTRODUODENOSCOPY (EGD);  Surgeon: Inda Castle, MD;  Location: [facility];  Service: Endoscopy;  Laterality: N/A;   KNEE SURGERY     laproscopy     NASAL SINUS SURGERY     PERICARDIOCENTESIS N/A 03/16/2021   Procedure: PERICARDIOCENTESIS;  Surgeon: Nigel Mormon, MD;  Location: [facility];  Service: Cardiovascular;  Laterality: N/A;   VIDEO ASSISTED THORACOSCOPY Right 03/18/2021   Procedure: VIDEO ASSISTED THORACOSCOPY FOR PERICARDIAL WINDOW;  Surgeon: Lajuana Matte, MD;  Location: [facility];  Service: Thoracic;  Laterality: Right;  pericardial window   Family History  Problem Relation Age of Onset   Hypertension Mother    Hyperlipidemia Mother    Heart failure Father    Heart attack Father    Cancer Other    Hypertension Half-Sister    Hyperlipidemia Half-Sister    Hypertension Half-Sister    Hyperlipidemia Half-Sister     Social History   Tobacco Use   Smoking status: Former    Packs/day: 0.15    Years: 20.00    Pack years: 3.00    Types: Cigarettes   Smokeless tobacco: Never  Substance Use Topics  Alcohol use: Not Currently   Marital Status: Single   ROS  Review of Systems  Constitutional: Negative for malaise/fatigue and weight gain.  Cardiovascular:  Negative for chest pain (resolved), claudication, leg swelling, near-syncope, orthopnea, palpitations, paroxysmal nocturnal dyspnea and syncope.  Respiratory:  Negative for shortness of breath.   Neurological:  Negative for dizziness.   Objective  Blood pressure 137/86, pulse 100, temperature  97.8 F (36.6 C), temperature source Temporal, height 5\' 2"  (1.575 m), weight 163 lb (73.9 kg), SpO2 96 %.  Vitals with BMI 06/26/2021 03/27/2021 03/26/2021  Height 5\' 2"  5\' 2"  5\' 2"   Weight 163 lbs 152 lbs 152 lbs 10 oz  BMI 29.81 11.65 79.0  Systolic 383 338 329  Diastolic 86 80 77  Pulse 191 100 97  Some encounter information is confidential and restricted. Go to Review Flowsheets activity to see all data.      Physical Exam Vitals reviewed.  HENT:     Head: Normocephalic and atraumatic.  Cardiovascular:     Rate and Rhythm: Normal rate and regular rhythm.     Pulses: Intact distal pulses.     Heart sounds: S1 normal and S2 normal. No murmur heard.   No gallop.  Pulmonary:     Effort: Pulmonary effort is normal. No respiratory distress.     Breath sounds: No wheezing, rhonchi or rales.  Musculoskeletal:     Right lower leg: No edema.     Left lower leg: No edema.  Neurological:     Mental Status: She is alert.    Laboratory examination:   Recent Labs    03/19/21 0403 03/20/21 0315 03/22/21 0138  NA 136 136 138  K 4.5 3.4* 3.9  CL 104 100 104  CO2 26 29 26   GLUCOSE 121* 118* 134*  BUN 6 7 12   CREATININE 0.69 0.68 0.70  CALCIUM 9.1 9.0 9.0  GFRNONAA >60 >60 >60   CrCl cannot be calculated (Patient's most recent lab result is older than the maximum 21 days allowed.).  CMP Latest Ref Rng & Units 03/22/2021 03/20/2021 03/19/2021  Glucose 70 - 99 mg/dL 134(H) 118(H) 121(H)  BUN 6 - 20 mg/dL 12 7 6   Creatinine 0.44 - 1.00 mg/dL 0.70 0.68 0.69  Sodium 135 - 145 mmol/L 138 136 136  Potassium 3.5 - 5.1 mmol/L 3.9 3.4(L) 4.5  Chloride 98 - 111 mmol/L 104 100 104  CO2 22 - 32 mmol/L 26 29 26   Calcium 8.9 - 10.3 mg/dL 9.0 9.0 9.1  Total Protein 6.5 - 8.1 g/dL - 6.3(L) -  Total Bilirubin 0.3 - 1.2 mg/dL - 0.6 -  Alkaline Phos 38 - 126 U/L - 79 -  AST 15 - 41 U/L - 44(H) -  ALT 0 - 44 U/L - 36 -   CBC Latest Ref Rng & Units 03/20/2021 03/19/2021 03/16/2021  WBC 4.0 - 10.5 K/uL  10.5 11.6(H) 8.8  Hemoglobin 12.0 - 15.0 g/dL 14.1 13.8 11.9(L)  Hematocrit 36.0 - 46.0 % 44.6 44.1 37.6  Platelets 150 - 400 K/uL 430(H) 394 365    Lipid Panel Recent Labs    03/16/21 0525  CHOL 75  TRIG 116  LDLCALC 27  VLDL 23  HDL 25*  CHOLHDL 3.0    HEMOGLOBIN A1C Lab Results  Component Value Date   HGBA1C 5.4 10/11/2014   MPG 108 10/11/2014   TSH No results for input(s): TSH in the last 8760 hours.  External labs:   None  Allergies  Allergies  Allergen Reactions   Coconut Oil Rash   Doxycycline Anaphylaxis and Other (See Comments)    Joint damage also   Gabapentin     Other reaction(s): Abdominal Pain, GI Bleed   Imitrex [Sumatriptan Base] Other (See Comments)    Severe hypertension, all triptans   Iodinated Diagnostic Agents Anaphylaxis   Lactose Diarrhea    Other reaction(s): GI Upset (intolerance)   Lidocaine Anaphylaxis    Pt states she does well with Marcaine/Bupivicaine without problem   Prednisone Other (See Comments)    She goes crazy   Sulfa Antibiotics Anaphylaxis   Sulfasalazine Anaphylaxis   Sumatriptan Other (See Comments)    Felt like she was "having a stroke", heart rate and blood pressure "went through the roof"   Tramadol Other (See Comments)    seizures   Levofloxacin Other (See Comments)    Joints hurt   Amoxicillin-Pot Clavulanate Other (See Comments)    Pt can't remember what type of reaction she had, but states her pharmacy has it listed in their allergy list.   Caffeine-Sodium Benzoate     Other reaction(s): Dizziness (intolerance)   Other Other (See Comments)    Artificial sugar - migraine   Prochlorperazine Other (See Comments)    Tonic/clonic seizure   Latex Rash   Levofloxacin Rash and Other (See Comments)    Joints hurt   Metrizamide Other (See Comments)   Nsaids Rash and Other (See Comments)    GI bleed   Tolmetin Rash    Medications Prior to Visit:   Outpatient Medications Prior to Visit  Medication Sig  Dispense Refill   acetaminophen (TYLENOL) 500 MG tablet Take 500-1,000 mg by mouth every 6 (six) hours as needed for moderate pain.     albuterol (VENTOLIN HFA) 108 (90 Base) MCG/ACT inhaler Inhale 2 puffs into the lungs every 6 (six) hours as needed for wheezing or shortness of breath.     amitriptyline (ELAVIL) 25 MG tablet Take 50 mg by mouth at bedtime.     amLODipine (NORVASC) 10 MG tablet Take 10 mg by mouth daily.     buprenorphine (BUTRANS) 10 MCG/HR PTWK Place 1 patch onto the skin every 7 (seven) days.     busPIRone (BUSPAR) 30 MG tablet Take 30 mg by mouth 2 (two) times daily.     diclofenac Sodium (VOLTAREN) 1 % GEL Apply 2 g topically 4 (four) times daily as needed (pain).     hydrochlorothiazide (HYDRODIURIL) 25 MG tablet Take 25 mg by mouth daily.     Melatonin-Pyridoxine (MELATIN PO) Take by mouth.     methocarbamol (ROBAXIN) 500 MG tablet Take 1,000 mg by mouth 3 (three) times daily as needed for muscle spasms.     Multiple Vitamin (MULTIVITAMIN WITH MINERALS) TABS tablet Take 1 tablet by mouth daily.     ondansetron (ZOFRAN-ODT) 4 MG disintegrating tablet Take 4 mg by mouth every 8 (eight) hours as needed for nausea or vomiting.     QUEtiapine (SEROQUEL) 100 MG tablet Take 100 mg by mouth at bedtime.     topiramate (TOPAMAX) 25 MG tablet Take 25 mg by mouth daily.     colchicine 0.6 MG tablet Take 1 tablet (0.6 mg total) by mouth 2 (two) times daily. 60 tablet 2   HYDROcodone-acetaminophen (NORCO/VICODIN) 5-325 MG tablet Take 1 tablet by mouth every 4 (four) hours as needed for moderate pain.     mirtazapine (REMERON) 15 MG tablet Take 15 mg by mouth at bedtime.  indomethacin (INDOCIN SR) 75 MG CR capsule Take 1 capsule (75 mg total) by mouth 2 (two) times daily with a meal. 60 capsule 3   rosuvastatin (CRESTOR) 20 MG tablet Take 20 mg by mouth at bedtime.     No facility-administered medications prior to visit.   Final Medications at End of Visit    Current Meds   Medication Sig   acetaminophen (TYLENOL) 500 MG tablet Take 500-1,000 mg by mouth every 6 (six) hours as needed for moderate pain.   albuterol (VENTOLIN HFA) 108 (90 Base) MCG/ACT inhaler Inhale 2 puffs into the lungs every 6 (six) hours as needed for wheezing or shortness of breath.   amitriptyline (ELAVIL) 25 MG tablet Take 50 mg by mouth at bedtime.   amLODipine (NORVASC) 10 MG tablet Take 10 mg by mouth daily.   buprenorphine (BUTRANS) 10 MCG/HR PTWK Place 1 patch onto the skin every 7 (seven) days.   busPIRone (BUSPAR) 30 MG tablet Take 30 mg by mouth 2 (two) times daily.   diclofenac Sodium (VOLTAREN) 1 % GEL Apply 2 g topically 4 (four) times daily as needed (pain).   hydrochlorothiazide (HYDRODIURIL) 25 MG tablet Take 25 mg by mouth daily.   Melatonin-Pyridoxine (MELATIN PO) Take by mouth.   methocarbamol (ROBAXIN) 500 MG tablet Take 1,000 mg by mouth 3 (three) times daily as needed for muscle spasms.   Multiple Vitamin (MULTIVITAMIN WITH MINERALS) TABS tablet Take 1 tablet by mouth daily.   ondansetron (ZOFRAN-ODT) 4 MG disintegrating tablet Take 4 mg by mouth every 8 (eight) hours as needed for nausea or vomiting.   QUEtiapine (SEROQUEL) 100 MG tablet Take 100 mg by mouth at bedtime.   spironolactone (ALDACTONE) 25 MG tablet Take 0.5 tablets (12.5 mg total) by mouth daily.   topiramate (TOPAMAX) 25 MG tablet Take 25 mg by mouth daily.   [DISCONTINUED] colchicine 0.6 MG tablet Take 1 tablet (0.6 mg total) by mouth 2 (two) times daily.   [DISCONTINUED] HYDROcodone-acetaminophen (NORCO/VICODIN) 5-325 MG tablet Take 1 tablet by mouth every 4 (four) hours as needed for moderate pain.   [DISCONTINUED] mirtazapine (REMERON) 15 MG tablet Take 15 mg by mouth at bedtime.   Radiology:  No results found.  DG Chest Port 1 View Result Date: 03/22/2021  Portable AP semi upright view at 1026 hours. Stable right chest power port, accessed. Lower thoracic spinal stimulator again noted. Mildly  improved lung volumes. Mediastinal contours remain within normal limits. No pneumothorax or pulmonary edema. No pleural effusion. Streaky bibasilar pulmonary opacity has improved since 03/19/2021 and now most resembles atelectasis. No acute osseous abnormality identified. Negative visible bowel gas pattern.   IMPRESSION:  1. Improving lung volumes and decreasing bibasilar atelectasis since 03/19/2021.  2. Satisfactory right chest Port-A-Cath. No new cardiopulmonary abnormality.  Cardiac Studies:   Echocardiogram 03/16/2021:  1. Left ventricular ejection fraction, by estimation, is 65 to 70%. The  left ventricle has normal function. The left ventricle has no regional  wall motion abnormalities. Left ventricular diastolic function could not  be evaluated.   2. Right ventricular systolic function is normal. The right ventricular  size is normal. There is mildly elevated pulmonary artery systolic  pressure.   3. Large circumferential pericardial effusion, reduced to trivial after  pericardiocentesis. No hemodynamic compromise seen after  pericardiocetnesis.   4. The mitral valve is grossly normal. No evidence of mitral valve  regurgitation.   5. Tricuspid valve regurgitation is moderate.   6. The inferior vena cava is normal in  size with greater than 50%  respiratory variability, suggesting right atrial pressure of 3 mmHg.   CARDIAC CATHETERIZATION 03/16/2021 Images from the original result were not included. Successful pericardiocentesis 1590 cc serosanguinous fluid  Pericardial window by video-assisted thoracoscopy 03/17/2021  Echocardiogram 03/26/2021:  Normal LV systolic function with visual EF 60-65%. Left ventricle cavity  is normal in size. Normal global wall motion. Normal diastolic filling pattern, normal LAP.  Mild (Grade I) mitral regurgitation.  Small pericardial effusion, located posteriorly, without hemodynamic significance.  Compared to study dated 03/17/2021 no significant  change.   EKG:   03/17/2021: Normal sinus rhythm at rate of 1 1 2  bpm scratch that sinus tachycardia at the rate of 102 bpm, normal axis.  Incomplete right bundle branch block.  Anteroseptal infarct old.  Low-voltage complexes.  Assessment     ICD-10-CM   1. Idiopathic pericardial effusion - s/p percardial window 03/17/21  I31.39     2. Dyspnea on exertion  R06.09     3. Essential hypertension  I10        Medications Discontinued During This Encounter  Medication Reason   HYDROcodone-acetaminophen (NORCO/VICODIN) 5-325 MG tablet Error   indomethacin (INDOCIN SR) 75 MG CR capsule Error   mirtazapine (REMERON) 15 MG tablet Error   rosuvastatin (CRESTOR) 20 MG tablet Error   colchicine 0.6 MG tablet Discontinued by provider    Meds ordered this encounter  Medications   spironolactone (ALDACTONE) 25 MG tablet    Sig: Take 0.5 tablets (12.5 mg total) by mouth daily.    Dispense:  45 tablet    Refill:  3   Recommendations:   Kaneisha Ellenberger is a 55 y.o. Caucasian female patient with history of severe major depression and prior suicide attempts, history of alcohol abuse, degenerative joint disease and chronic pain medications with oxycodone at home, admitted to the hospital on 03/15/2021 for massive pericardial effusion noted on echocardiogram in the outpatient basis.  Subsequently underwent elective pericardiocentesis followed by pericardial window on 03/17/2021 due to fluid reaccumulation.  Patient was admitted 03/15/2021 - 03/22/2021.  During hospitalization patient underwent elective pericardiocentesis and cardiac Cath Lab on 03/16/2021 however due to reaccumulation of fluid she underwent pericardial window on 03/17/2021 by video-assisted lateral thoracotomy.  Patient remained inpatient until no further drainage was evident and chest tubes were removed.  Patient was discharged on indomethacin and colchicine.  However she never began indomethacin due to concerns over history of GI bleeding.   Patient presents for 61-month follow-up.   Patient is feeling well with resolution of chest pain and shortness of breath.  We will therefore taper off of colchicine, she will notify us if she has recurrence of chest pain or shortness of breath.  Patient may remain off Crestor at this time, however will defer further management of hyperlipidemia to PCP.  Patient's blood pressure is elevated in the office today, will resume spironolactone 12.5 mg.  Patient is scheduled for labs with PCP next month, recommend BMP following initiation of spironolactone.  Patient is otherwise stable from a cardiovascular standpoint.  Will defer hypertension and hyperlipidemia management to PCP.  Follow-up in 6 months, sooner if needed.    Alethia Berthold, PA-C 06/26/2021, 4:06 PM Office: [phone]

## 2021-06-26 ENCOUNTER — Encounter: Payer: Self-pay | Admitting: Student

## 2021-06-26 ENCOUNTER — Ambulatory Visit: Payer: Medicare Other | Admitting: Student

## 2021-06-26 ENCOUNTER — Other Ambulatory Visit: Payer: Self-pay

## 2021-06-26 VITALS — BP 137/86 | HR 100 | Temp 97.8°F | Ht 62.0 in | Wt 163.0 lb

## 2021-06-26 DIAGNOSIS — I3139 Other pericardial effusion (noninflammatory): Secondary | ICD-10-CM

## 2021-06-26 DIAGNOSIS — I10 Essential (primary) hypertension: Secondary | ICD-10-CM

## 2021-06-26 DIAGNOSIS — R0609 Other forms of dyspnea: Secondary | ICD-10-CM

## 2021-06-26 MED ORDER — SPIRONOLACTONE 25 MG PO TABS: 12.5000 mg | ORAL_TABLET | Freq: Every day | ORAL | 3 refills | Status: DC

## 2021-06-26 NOTE — Patient Instructions (Addendum)
Colchicine taper:  0.3 mg twice daily for 1 week  0.3 mg once daily for 1 week  Stop colchicine

## 2021-06-30 ENCOUNTER — Emergency Department (HOSPITAL_BASED_OUTPATIENT_CLINIC_OR_DEPARTMENT_OTHER): Payer: Medicare Other | Admitting: Radiology

## 2021-06-30 ENCOUNTER — Encounter (HOSPITAL_BASED_OUTPATIENT_CLINIC_OR_DEPARTMENT_OTHER): Payer: Self-pay | Admitting: Emergency Medicine

## 2021-06-30 ENCOUNTER — Other Ambulatory Visit: Payer: Self-pay

## 2021-06-30 ENCOUNTER — Emergency Department (HOSPITAL_BASED_OUTPATIENT_CLINIC_OR_DEPARTMENT_OTHER)
Admission: EM | Admit: 2021-06-30 | Discharge: 2021-06-30 | Disposition: A | Discharge status: Home / Self Care | Payer: Medicare Other | Attending: Emergency Medicine | Admitting: Emergency Medicine

## 2021-06-30 DIAGNOSIS — Z87891 Personal history of nicotine dependence: Secondary | ICD-10-CM | POA: Insufficient documentation

## 2021-06-30 DIAGNOSIS — I10 Essential (primary) hypertension: Secondary | ICD-10-CM | POA: Insufficient documentation

## 2021-06-30 DIAGNOSIS — X58XXXA Exposure to other specified factors, initial encounter: Secondary | ICD-10-CM | POA: Insufficient documentation

## 2021-06-30 DIAGNOSIS — Z91040 Latex allergy status: Secondary | ICD-10-CM | POA: Insufficient documentation

## 2021-06-30 DIAGNOSIS — S299XXA Unspecified injury of thorax, initial encounter: Secondary | ICD-10-CM | POA: Diagnosis present

## 2021-06-30 DIAGNOSIS — S2239XA Fracture of one rib, unspecified side, initial encounter for closed fracture: Secondary | ICD-10-CM | POA: Insufficient documentation

## 2021-06-30 DIAGNOSIS — Z79899 Other long term (current) drug therapy: Secondary | ICD-10-CM | POA: Insufficient documentation

## 2021-06-30 MED ORDER — CARISOPRODOL 350 MG PO TABS: 350.0000 mg | ORAL_TABLET | Freq: Three times a day (TID) | ORAL | 0 refills | Status: DC

## 2021-06-30 NOTE — ED Triage Notes (Signed)
Pt was pulling carpet with her sister when she heard and felt a pop in her left rib cage last night.  Pt reports minimal pain when at rest, but stabbing pain with movement.

## 2021-06-30 NOTE — Discharge Instructions (Addendum)
Use your incentive spirometer multiple times a day, at minimum at least twice. Take the soma as written for pain control at night. Take tylenol for breakthrough pain

## 2021-06-30 NOTE — ED Provider Notes (Signed)
[place] [facility] EMERGENCY DEPT Provider Note   CSN: [id number] Arrival date & time: 06/30/21  1250     History Chief Complaint  Patient presents with   Chest Pain    Rib injury    [name] is a 55 y.o. female.   Chest Pain Associated symptoms: no abdominal pain, no cough, no fever and no shortness of breath     Patient presents presents with left rib pain.  Started acutely yesterday when she was helping her sister pull carpet from the floor.  The pain is constant, is worsened by deep inhalation and coughing.  She has tried Tylenol with minimal relief.  Denies feeling short of breath or having any chest or back pain.  Past Medical History:  Diagnosis Date   Anorexia    Anxiety    Depression    DJD (degenerative joint disease) of cervical spine    Endometriosis    ETOH abuse    sober 3 1/2 years   Hyperlipidemia    Hypertension    Migraines    PICC (peripherally inserted central catheter) in place    rt neck   Psychiatric pseudoseizure    Seizures ([place])     Patient Active Problem List   Diagnosis Date Noted   Pericardial effusion 03/15/2021   Cardiac tamponade 03/14/2021   Cardiomegaly 03/09/2021   Dyspnea on exertion 01/30/2019   Abdominal pain    Suicide attempt ([place])    Amitriptyline overdose    Respiratory acidosis    Prolonged Q-T interval on ECG    Alcohol abuse    Depression    Anxiety state    Bipolar I disorder, most recent episode (or current) unspecified    Respiratory failure ([place])    Seizure-like activity ([place]) 12/11/2014   Suicide attempt by hanging [facility])    MDD (major depressive disorder), recurrent severe, without psychosis ([place]) 11/29/2014   Panic disorder 11/29/2014   Agoraphobia 11/29/2014   ARF (acute renal failure) ([facility]) 11/25/2014   Intentional acetaminophen overdose ([place]) 10/26/2014   Major depressive disorder, recurrent, severe without psychotic features (H[facility])    Insomnia    Chronic pain syndrome    Bipolar 1  disorder, mixed, severe ([place]) 10/09/2014   Acute blood loss anemia 10/07/2014   Liver failure, acute    GI bleed 10/06/2014   Duodenal ulcer hemorrhage 10/06/2014   Vomiting 10/05/2014   Acute hepatitis 10/05/2014   Acute renal insufficiency 10/05/2014   Seizure disorder ([place]) 10/05/2014   Hyperammonemia ([place]) 10/05/2014   Suicidal ideation 10/05/2014   Bipolar disorder ([place]) 10/01/2014   Major depression 10/01/2014   Nausea and vomiting 09/30/2014   Diarrhea 09/30/2014   SIRS (systemic inflammatory response syndrome) ([place]) 07/15/2014   Tylenol overdose 07/13/2014   Hypokalemia 07/13/2014    Past Surgical History:  Procedure Laterality Date   ABDOMINAL HYSTERECTOMY     ABDOMINAL SURGERY     APPENDECTOMY     CARPAL TUNNEL RELEASE     2010   CHOLECYSTECTOMY     ESOPHAGOGASTRODUODENOSCOPY N/A 10/06/2014   Procedure: ESOPHAGOGASTRODUODENOSCOPY (EGD);  Surgeon: Inda Castle, MD;  Location: [facility];  Service: Endoscopy;  Laterality: N/A;   KNEE SURGERY     laproscopy     NASAL SINUS SURGERY     PERICARDIOCENTESIS N/A 03/16/2021   Procedure: PERICARDIOCENTESIS;  Surgeon: Nigel Mormon, MD;  Location: [facility];  Service: Cardiovascular;  Laterality: N/A;   VIDEO ASSISTED THORACOSCOPY Right 03/18/2021   Procedure: VIDEO ASSISTED THORACOSCOPY FOR  PERICARDIAL WINDOW;  Surgeon: Lajuana Matte, MD;  Location[facility] OR;  Service: Thoracic;  Laterality: Right;  pericardial window     OB History   No obstetric history on file.     Family History  Problem Relation Age of Onset   Hypertension Mother    Hyperlipidemia Mother    Heart failure Father    Heart attack Father    Cancer Other    Hypertension Half-Sister    Hyperlipidemia Half-Sister    Hypertension Half-Sister    Hyperlipidemia Half-Sister     Social History   Tobacco Use   Smoking status: Former    Packs/day: 0.15    Years: 20.00    Pack years: 3.00    Types: Cigarettes   Smokeless  tobacco: Never  Vaping Use   Vaping Use: Never used  Substance Use Topics   Alcohol use: Not Currently   Drug use: Not Currently    Home Medications Prior to Admission medications   Medication Sig Start Date End Date Taking? Authorizing Provider  acetaminophen (TYLENOL) 500 MG tablet Take 500-1,000 mg by mouth every 6 (six) hours as needed for moderate pain.    [provider]  albuterol (VENTOLIN HFA) 108 (90 Base) MCG/ACT inhaler Inhale 2 puffs into the lungs every 6 (six) hours as needed for wheezing or shortness of breath.    [provider]  amitriptyline (ELAVIL) 25 MG tablet Take 50 mg by mouth at bedtime.    [provider]  amLODipine (NORVASC) 10 MG tablet Take 10 mg by mouth daily.    [provider]  buprenorphine (BUTRANS) 10 MCG/HR [place] 1 patch onto the skin every 7 (seven) days. 06/16/21   [provider]  busPIRone (BUSPAR) 30 MG tablet Take 30 mg by mouth 2 (two) times daily.    [provider]  diclofenac Sodium (VOLTAREN) 1 % GEL Apply 2 g topically 4 (four) times daily as needed (pain).    [provider]  hydrochlorothiazide (HYDRODIURIL) 25 MG tablet Take 25 mg by mouth daily. 03/06/19   [provider]  Melatonin-Pyridoxine (MELATIN PO) Take by mouth.    [provider]  methocarbamol (ROBAXIN) 500 MG tablet Take 1,000 mg by mouth 3 (three) times daily as needed for muscle spasms.    [provider]  Multiple Vitamin (MULTIVITAMIN WITH MINERALS) TABS tablet Take 1 tablet by mouth daily.    [provider]  ondansetron (ZOFRAN-ODT) 4 MG disintegrating tablet Take 4 mg by mouth every 8 (eight) hours as needed for nausea or vomiting.    [provider]  QUEtiapine (SEROQUEL) 100 MG tablet Take 100 mg by mouth at bedtime.    [provider]  spironolactone (ALDACTONE) 25 MG tablet Take 0.5 tablets (12.5 mg total) by mouth daily. 06/26/21 06/21/22  [place],  Celeste C, PA-C  topiramate (TOPAMAX) 25 MG tablet Take 25 mg by mouth daily.    [provider]  potassium chloride SA (KLOR-CON) 20 MEQ tablet Take 2 tablets (40 mEq total) by mouth 2 (two) times daily. 03/18/21 03/22/21  Nigel Mormon, MD    Allergies    Aspirin, Coconut oil, Doxycycline, Gabapentin, Imitrex [sumatriptan base], Iodinated diagnostic agents, Lactose, Lidocaine, Prednisone, Sulfa antibiotics, Sulfasalazine, Sumatriptan, Tramadol, Levofloxacin, Amoxicillin-pot clavulanate, Caffeine-sodium benzoate, Other, Prochlorperazine, Latex, Levofloxacin, Metrizamide, Nsaids, and Tolmetin  Review of Systems   Review of Systems  Constitutional:  Negative for fever.  Respiratory:  Negative for cough and shortness of breath.  Cardiovascular:  Positive for chest pain.       RIB pain  Gastrointestinal:  Negative for abdominal pain.   Physical Exam Updated Vital Signs BP (!) 154/93 (BP Location: Left Arm)   Pulse 95   Temp 98.2 F (36.8 C) (Oral) Comment: Pt stated that she was drinking OJ  Resp 16   Ht 5\' 2"  (1.575 m)   Wt 72.1 kg   SpO2 99%   BMI 29.08 kg/m   Physical Exam Vitals and nursing note reviewed. Exam conducted with a chaperone present.  Constitutional:      General: She is not in acute distress.    Appearance: Normal appearance.  HENT:     Head: Normocephalic and atraumatic.  Eyes:     General: No scleral icterus.    Extraocular Movements: Extraocular movements intact.     Pupils: Pupils are equal, round, and reactive to light.  Pulmonary:     Effort: Pulmonary effort is normal.     Comments: Resting comfortably, no adventitious lung sounds.  Lung sounds present in all quadrants bilaterally.  No tachypnea or accessory muscle use. Chest:     Chest wall: Tenderness present.     Comments: Reproducible chest wall pain to the left lower ribs. Skin:    Coloration: Skin is not jaundiced.  Neurological:     Mental Status: She is alert. Mental status  is at baseline.     Coordination: Coordination normal.    ED Results / Procedures / Treatments   Labs (all labs ordered are listed, but only abnormal results are displayed) Labs Reviewed - No data to display  EKG None  Radiology DG Ribs Unilateral W/Chest Left  Result Date: 06/30/2021 CLINICAL DATA:  Left rib pain with injury. EXAM: LEFT RIBS AND CHEST - 3+ VIEW COMPARISON:  03/22/2021 FINDINGS: Fracture is noted involving the anterior aspect of the left eighth rib. There is no evidence of pneumothorax or pleural effusion. Atelectasis versus scar identified within both lung bases. Right chest wall port a catheter noted with tip in the projection of the cavoatrial junction. Dorsal column stimulator is noted with leads terminating in the lower thoracic spine. Heart size and mediastinal contours are within normal limits. IMPRESSION: 1. Acute fracture of the anterior aspect of the left eighth rib. 2. Bibasilar atelectasis versus scar. Electronically Signed   By: Kerby Moors M.D.   On: 06/30/2021 13:55    Procedures Procedures   Medications Ordered in ED Medications - No data to display  ED Course  I have reviewed the triage vital signs and the nursing notes.  Pertinent labs & imaging results that were available during my care of the patient were reviewed by me and considered in my medical decision making (see chart for details).    MDM Rules/Calculators/A&P                           Vital stable, no adventitious lung sounds.  Doubt pneumothorax, reproducible chest wall pain consistent with injury to ribs or musculoskeletal pain.  We will proceed with radiograph.  Imaging notable for radiograph.  Patient does not have any signs of respiratory distress, she has an incentive spirometer at home.  Will discharge with supportive care instructions and outpatient follow-up.  Final Clinical Impression(s) / ED Diagnoses Final diagnoses:  Closed fracture of one rib, unspecified  laterality, initial encounter    Rx / DC Orders ED Discharge Orders     None  Sherrill Raring, PA-C 06/30/21 1438    Pattricia Boss, MD 07/01/21 [phone]

## 2021-12-10 ENCOUNTER — Other Ambulatory Visit: Payer: Self-pay | Admitting: Pain Medicine

## 2021-12-16 ENCOUNTER — Other Ambulatory Visit: Payer: Self-pay

## 2021-12-16 ENCOUNTER — Encounter (HOSPITAL_COMMUNITY): Payer: Self-pay | Admitting: Pain Medicine

## 2021-12-16 NOTE — Progress Notes (Signed)
PCP - Dr. Emilee Hero Bonsu ?Cardiologist - Dr. Virgina Jock ?EKG - 03/09/21 ?Chest x-ray - 03/28/21 ?ECHO -  ?Cardiac Cath -  ?CPAP -  ? ?ERAS Protcol - n/a ?COVID TEST- n/a ? ?Anesthesia review: YES ? ?------------- ? ?SDW INSTRUCTIONS: ? ?Your procedure is scheduled on Friday 3/31. Please report to [facility] Main Entrance "A" at 0530 A.M., and check in at the Admitting office. Call this number if you have problems the morning of surgery: [phone] ? ? ?Remember: Do not eat or drink after midnight the night before your surgery ? ?Medications to take morning of surgery with a sip of water include: ?amLODipine (NORVASC)  ?busPIRone (BUSPAR)  ?acetaminophen (TYLENOL)  if needed ?methocarbamol (ROBAXIN)  if needed ? ? ?As of today, STOP taking any Aspirin (unless otherwise instructed by your surgeon), Aleve, Naproxen, Ibuprofen, Motrin, Advil, Goody's, BC's, all herbal medications, fish oil, and all vitamins. ? ?  ?The Morning of Surgery ?Do not wear jewelry, make-up or nail polish. ?Do not wear lotions, powders, or perfumes, or deodorant ?Do not bring valuables to the hospital. ?[facility] is not responsible for any belongings or valuables. ? ?If you are a smoker, DO NOT Smoke 24 hours prior to surgery ? ?If you wear a CPAP at night please bring your mask the morning of surgery  ? ?Remember that you must have someone to transport you home after your surgery, and remain with you for 24 hours if you are discharged the same day. ? ?Please bring cases for contacts, glasses, hearing aids, dentures or bridgework because it cannot be worn into surgery.  ? ?Patients discharged the day of surgery will not be allowed to drive home.  ? ?Please shower the NIGHT BEFORE/MORNING OF SURGERY (use antibacterial soap like DIAL soap if possible). Wear comfortable clothes the morning of surgery. Oral Hygiene is also important to reduce your risk of infection.  Remember - BRUSH YOUR TEETH THE MORNING OF SURGERY WITH YOUR REGULAR  TOOTHPASTE ? ?Patient denies shortness of breath, fever, cough and chest pain.  ? ? ?   ? ?

## 2021-12-17 NOTE — Anesthesia Preprocedure Evaluation (Addendum)
Anesthesia Evaluation  ?Patient identified by MRN, date of birth, ID band ?Patient awake ? ? ? ?Reviewed: ?Allergy & Precautions, NPO status , Patient's Chart, lab work & pertinent test results ? ?Airway ?Mallampati: III ? ?TM Distance: >3 FB ?Neck ROM: Full ? ? ? Dental ?no notable dental hx. ?(+) Teeth Intact, Dental Advisory Given ?  ?Pulmonary ?neg pulmonary ROS, Patient abstained from smoking., former smoker,  ?Post covid lung issues ?  ?Pulmonary exam normal ?breath sounds clear to auscultation ? ? ? ? ? ? Cardiovascular ?hypertension, Pt. on medications ?negative cardio ROS ?Normal cardiovascular exam ?Rhythm:Regular Rate:Normal ? ?ECHO: ?1. Left ventricular ejection fraction, by estimation, is 60 to 65%. The left ventricle has normal function.  ??2. Small circumferential pericardial effusion with mostly clear fluids but loculated stranding on RV free wall. No tamponade.  ?3. The inferior vena cava is normal in size with <50% respiratory variability, suggesting right atrial pressure of 8 mmHg.  ?4. Minimal increase in pericardial fluid compared to post  ?pericardiocentesis echcoardiogram images on 03/16/2021.  ?  ?Neuro/Psych ? Headaches, Seizures -, Well Controlled,  PSYCHIATRIC DISORDERS Anxiety Depression Bipolar Disorder Psychiatric pseudoseizurenegative neurological ROS ? negative psych ROS  ? GI/Hepatic ?negative GI ROS, Neg liver ROS, (+)  ?  ? substance abuse ? ,   ?Endo/Other  ?negative endocrine ROShypokalemia ? Renal/GU ?negative Renal ROS  ?negative genitourinary ?  ?Musculoskeletal ?negative musculoskeletal ROS ?(+) Arthritis ,  ? Abdominal ?  ?Peds ?negative pediatric ROS ?(+)  Hematology ?negative hematology ROS ?(+) Blood dyscrasia, anemia , HLD   ?Anesthesia Other Findings ? ? Reproductive/Obstetrics ?negative OB ROS ? ?  ? ? ? ? ? ? ? ? ? ? ? ? ? ?  ?  ? ? ? ? ? ? ?Anesthesia Physical ? ?Anesthesia Plan ? ?ASA: 3 ? ?Anesthesia Plan: General  ? ?Post-op Pain  Management: Minimal or no pain anticipated  ? ?Induction: Intravenous ? ?PONV Risk Score and Plan: 2 and Ondansetron, Dexamethasone, Midazolam and Treatment may vary due to age or medical condition ? ?Airway Management Planned: Oral ETT and LMA ? ?Additional Equipment: None ? ?Intra-op Plan:  ? ?Post-operative Plan: Extubation in OR ? ?Informed Consent: I have reviewed the patients History and Physical, chart, labs and discussed the procedure including the risks, benefits and alternatives for the proposed anesthesia with the patient or authorized representative who has indicated his/her understanding and acceptance.  ? ? ? ?Dental advisory given ? ?Plan Discussed with: CRNA and Anesthesiologist ? ?Anesthesia Plan Comments:   ? ? ? ? ? ? ?Anesthesia Quick Evaluation ? ?

## 2021-12-18 ENCOUNTER — Ambulatory Visit (HOSPITAL_BASED_OUTPATIENT_CLINIC_OR_DEPARTMENT_OTHER): Payer: Medicare Other | Admitting: Anesthesiology

## 2021-12-18 ENCOUNTER — Encounter (HOSPITAL_COMMUNITY)
Admission: RE | Disposition: A | Discharge status: Home / Self Care | Payer: Self-pay | Source: Home / Self Care | Attending: Pain Medicine

## 2021-12-18 ENCOUNTER — Ambulatory Visit (HOSPITAL_COMMUNITY): Payer: Medicare Other

## 2021-12-18 ENCOUNTER — Encounter (HOSPITAL_COMMUNITY): Payer: Self-pay | Admitting: Pain Medicine

## 2021-12-18 ENCOUNTER — Other Ambulatory Visit: Payer: Self-pay

## 2021-12-18 ENCOUNTER — Ambulatory Visit (HOSPITAL_COMMUNITY): Payer: Medicare Other | Admitting: Anesthesiology

## 2021-12-18 ENCOUNTER — Ambulatory Visit (HOSPITAL_COMMUNITY)
Admission: RE | Admit: 2021-12-18 | Discharge: 2021-12-18 | Disposition: A | Discharge status: Home / Self Care | Payer: Medicare Other | Attending: Pain Medicine | Admitting: Pain Medicine

## 2021-12-18 DIAGNOSIS — F319 Bipolar disorder, unspecified: Secondary | ICD-10-CM | POA: Diagnosis not present

## 2021-12-18 DIAGNOSIS — F418 Other specified anxiety disorders: Secondary | ICD-10-CM

## 2021-12-18 DIAGNOSIS — I10 Essential (primary) hypertension: Secondary | ICD-10-CM | POA: Diagnosis not present

## 2021-12-18 DIAGNOSIS — Z462 Encounter for fitting and adjustment of other devices related to nervous system and special senses: Secondary | ICD-10-CM | POA: Insufficient documentation

## 2021-12-18 DIAGNOSIS — G40909 Epilepsy, unspecified, not intractable, without status epilepticus: Secondary | ICD-10-CM | POA: Diagnosis not present

## 2021-12-18 DIAGNOSIS — Z8616 Personal history of COVID-19: Secondary | ICD-10-CM | POA: Diagnosis not present

## 2021-12-18 DIAGNOSIS — Z79899 Other long term (current) drug therapy: Secondary | ICD-10-CM | POA: Diagnosis not present

## 2021-12-18 DIAGNOSIS — F419 Anxiety disorder, unspecified: Secondary | ICD-10-CM | POA: Insufficient documentation

## 2021-12-18 DIAGNOSIS — M1990 Unspecified osteoarthritis, unspecified site: Secondary | ICD-10-CM

## 2021-12-18 DIAGNOSIS — Z87891 Personal history of nicotine dependence: Secondary | ICD-10-CM | POA: Diagnosis not present

## 2021-12-18 DIAGNOSIS — G894 Chronic pain syndrome: Secondary | ICD-10-CM | POA: Diagnosis not present

## 2021-12-18 HISTORY — PX: SPINAL CORD STIMULATOR REMOVAL: SHX5379

## 2021-12-18 LAB — BASIC METABOLIC PANEL
Anion gap: 10 (ref 5–15)
BUN: 10 mg/dL (ref 6–20)
CO2: 29 mmol/L (ref 22–32)
Calcium: 9.8 mg/dL (ref 8.9–10.3)
Chloride: 95 mmol/L — ABNORMAL LOW (ref 98–111)
Creatinine, Ser: 0.81 mg/dL (ref 0.44–1.00)
GFR, Estimated: >60 mL/min (ref >60)
Glucose, Bld: 96 mg/dL (ref 70–99)
Potassium: 3.2 mmol/L — ABNORMAL LOW (ref 3.5–5.1)
Sodium: 134 mmol/L — ABNORMAL LOW (ref 135–145)

## 2021-12-18 LAB — CBC
HCT: 46.2 % — ABNORMAL HIGH (ref 36.0–46.0)
Hemoglobin: 16 g/dL — ABNORMAL HIGH (ref 12.0–15.0)
MCH: 30.9 pg (ref 26.0–34.0)
MCHC: 34.6 g/dL (ref 30.0–36.0)
MCV: 89.4 fL (ref 80.0–100.0)
Platelets: 353 10*3/uL (ref 150–400)
RBC: 5.17 MIL/uL — ABNORMAL HIGH (ref 3.87–5.11)
RDW: 12.7 % (ref 11.5–15.5)
WBC: 9.1 10*3/uL (ref 4.0–10.5)
nRBC: 0 % (ref 0.0–0.2)

## 2021-12-18 SURGERY — LUMBAR SPINAL CORD STIMULATOR REMOVAL
Anesthesia: General

## 2021-12-18 MED ORDER — MEPERIDINE HCL 25 MG/ML IJ SOLN: 6.2500 mg | INTRAMUSCULAR | Status: DC | PRN

## 2021-12-18 MED ORDER — CEFAZOLIN SODIUM-DEXTROSE 2-3 GM-%(50ML) IV SOLR
INTRAVENOUS | Status: DC | PRN
  Administered 2021-12-18: 2 g via INTRAVENOUS

## 2021-12-18 MED ORDER — ONDANSETRON HCL 4 MG/2ML IJ SOLN
INTRAMUSCULAR | Status: AC
  Filled 2021-12-18: qty 2

## 2021-12-18 MED ORDER — FENTANYL CITRATE (PF) 100 MCG/2ML IJ SOLN
25.0000 ug | INTRAMUSCULAR | Status: DC | PRN
  Administered 2021-12-18 (×2): 50 ug via INTRAVENOUS

## 2021-12-18 MED ORDER — ROCURONIUM BROMIDE 10 MG/ML (PF) SYRINGE
PREFILLED_SYRINGE | INTRAVENOUS | Status: DC | PRN
  Administered 2021-12-18: 50 mg via INTRAVENOUS

## 2021-12-18 MED ORDER — ACETAMINOPHEN 160 MG/5ML PO SOLN: 325.0000 mg | ORAL | Status: DC | PRN

## 2021-12-18 MED ORDER — BUPIVACAINE HCL 0.25 % IJ SOLN
INTRAMUSCULAR | Status: DC | PRN
  Administered 2021-12-18: 6 mL

## 2021-12-18 MED ORDER — HEPARIN SOD (PORK) LOCK FLUSH 100 UNIT/ML IV SOLN
500.0000 [IU] | INTRAVENOUS | Status: AC | PRN
  Administered 2021-12-18: 500 [IU]

## 2021-12-18 MED ORDER — LACTATED RINGERS IV SOLN: INTRAVENOUS | Status: DC

## 2021-12-18 MED ORDER — OXYCODONE HCL 5 MG PO TABS
5.0000 mg | ORAL_TABLET | Freq: Once | ORAL | Status: AC | PRN
  Administered 2021-12-18: 5 mg via ORAL

## 2021-12-18 MED ORDER — 0.9 % SODIUM CHLORIDE (POUR BTL) OPTIME
TOPICAL | Status: DC | PRN
  Administered 2021-12-18: 1000 mL

## 2021-12-18 MED ORDER — FENTANYL CITRATE (PF) 250 MCG/5ML IJ SOLN
INTRAMUSCULAR | Status: DC | PRN
  Administered 2021-12-18 (×2): 50 ug via INTRAVENOUS
  Administered 2021-12-18: 100 ug via INTRAVENOUS

## 2021-12-18 MED ORDER — MIDAZOLAM HCL 2 MG/2ML IJ SOLN
INTRAMUSCULAR | Status: AC
  Filled 2021-12-18: qty 2

## 2021-12-18 MED ORDER — ORAL CARE MOUTH RINSE: 15.0000 mL | Freq: Once | OROMUCOSAL | Status: AC

## 2021-12-18 MED ORDER — LIDOCAINE 2% (20 MG/ML) 5 ML SYRINGE
INTRAMUSCULAR | Status: AC
  Filled 2021-12-18: qty 5

## 2021-12-18 MED ORDER — MIDAZOLAM HCL 2 MG/2ML IJ SOLN
INTRAMUSCULAR | Status: DC | PRN
  Administered 2021-12-18: 2 mg via INTRAVENOUS

## 2021-12-18 MED ORDER — SUGAMMADEX SODIUM 200 MG/2ML IV SOLN
INTRAVENOUS | Status: DC | PRN
  Administered 2021-12-18: 300 mg via INTRAVENOUS

## 2021-12-18 MED ORDER — BUPIVACAINE HCL (PF) 0.25 % IJ SOLN
INTRAMUSCULAR | Status: AC
  Filled 2021-12-18: qty 30

## 2021-12-18 MED ORDER — DEXAMETHASONE SODIUM PHOSPHATE 10 MG/ML IJ SOLN
INTRAMUSCULAR | Status: AC
  Filled 2021-12-18: qty 1

## 2021-12-18 MED ORDER — OXYCODONE HCL 5 MG PO TABS
ORAL_TABLET | ORAL | Status: AC
  Filled 2021-12-18: qty 1

## 2021-12-18 MED ORDER — FENTANYL CITRATE (PF) 250 MCG/5ML IJ SOLN
INTRAMUSCULAR | Status: AC
  Filled 2021-12-18: qty 5

## 2021-12-18 MED ORDER — OXYCODONE HCL 5 MG/5ML PO SOLN: 5.0000 mg | Freq: Once | ORAL | Status: AC | PRN

## 2021-12-18 MED ORDER — FENTANYL CITRATE (PF) 100 MCG/2ML IJ SOLN
INTRAMUSCULAR | Status: AC
  Filled 2021-12-18: qty 2

## 2021-12-18 MED ORDER — PROPOFOL 10 MG/ML IV BOLUS
INTRAVENOUS | Status: DC | PRN
  Administered 2021-12-18: 150 mg via INTRAVENOUS

## 2021-12-18 MED ORDER — CHLORHEXIDINE GLUCONATE 0.12 % MT SOLN: 15.0000 mL | Freq: Once | OROMUCOSAL | Status: AC

## 2021-12-18 MED ORDER — DEXAMETHASONE SODIUM PHOSPHATE 10 MG/ML IJ SOLN
INTRAMUSCULAR | Status: DC | PRN
  Administered 2021-12-18: 10 mg via INTRAVENOUS

## 2021-12-18 MED ORDER — ONDANSETRON HCL 4 MG/2ML IJ SOLN
INTRAMUSCULAR | Status: DC | PRN
  Administered 2021-12-18: 4 mg via INTRAVENOUS

## 2021-12-18 MED ORDER — ACETAMINOPHEN 325 MG PO TABS: 325.0000 mg | ORAL_TABLET | ORAL | Status: DC | PRN

## 2021-12-18 MED ORDER — CHLORHEXIDINE GLUCONATE 0.12 % MT SOLN
OROMUCOSAL | Status: AC
  Administered 2021-12-18: 15 mL via OROMUCOSAL
  Filled 2021-12-18: qty 15

## 2021-12-18 MED ORDER — PROPOFOL 10 MG/ML IV BOLUS
INTRAVENOUS | Status: AC
  Filled 2021-12-18: qty 20

## 2021-12-18 SURGICAL SUPPLY — 54 items
BAG COUNTER SPONGE SURGICOUNT (BAG) ×2 IMPLANT
BAG DECANTER FOR FLEXI CONT (MISCELLANEOUS) ×2 IMPLANT
BENZOIN TINCTURE PRP APPL 2/3 (GAUZE/BANDAGES/DRESSINGS) IMPLANT
BINDER ABDOMINAL 12 ML 46-62 (SOFTGOODS) ×1 IMPLANT
BLADE CLIPPER SURG (BLADE) IMPLANT
CHLORAPREP W/TINT 26 (MISCELLANEOUS) ×2 IMPLANT
CLEANER TIP ELECTROSURG 2X2 (MISCELLANEOUS) ×3 IMPLANT
CLIP VESOCCLUDE SM WIDE 6/CT (CLIP) IMPLANT
DERMABOND ADVANCED (GAUZE/BANDAGES/DRESSINGS) ×1
DERMABOND ADVANCED .7 DNX12 (GAUZE/BANDAGES/DRESSINGS) ×1 IMPLANT
DRAPE C-ARM 42X72 X-RAY (DRAPES) ×2 IMPLANT
DRAPE C-ARMOR (DRAPES) ×1 IMPLANT
DRAPE LAPAROTOMY 100X72X124 (DRAPES) ×2 IMPLANT
DRAPE SURG 17X23 STRL (DRAPES) ×8 IMPLANT
DRSG OPSITE POSTOP 3X4 (GAUZE/BANDAGES/DRESSINGS) ×2 IMPLANT
ELECT REM PT RETURN 9FT ADLT (ELECTROSURGICAL) ×2
ELECTRODE REM PT RTRN 9FT ADLT (ELECTROSURGICAL) ×1 IMPLANT
GAUZE 4X4 16PLY ~~LOC~~+RFID DBL (SPONGE) ×2 IMPLANT
GLOVE EXAM NITRILE XL STR (GLOVE) IMPLANT
GLOVE EXAM NITRILE XS STR PU (GLOVE) IMPLANT
GLOVE SRG 8 PF TXTR STRL LF DI (GLOVE) ×1 IMPLANT
GLOVE SURG ENC MOIS LTX SZ8 (GLOVE) ×2 IMPLANT
GLOVE SURG UNDER POLY LF SZ7 (GLOVE) ×2 IMPLANT
GLOVE SURG UNDER POLY LF SZ8 (GLOVE) ×2
GOWN STRL REUS W/ TWL LRG LVL3 (GOWN DISPOSABLE) IMPLANT
GOWN STRL REUS W/ TWL XL LVL3 (GOWN DISPOSABLE) IMPLANT
GOWN STRL REUS W/TWL 2XL LVL3 (GOWN DISPOSABLE) IMPLANT
GOWN STRL REUS W/TWL LRG LVL3 (GOWN DISPOSABLE) ×4
GOWN STRL REUS W/TWL XL LVL3 (GOWN DISPOSABLE) ×2
KIT BASIN OR (CUSTOM PROCEDURE TRAY) ×2 IMPLANT
KIT TURNOVER KIT B (KITS) ×2 IMPLANT
NDL 18GX1X1/2 (RX/OR ONLY) (NEEDLE) IMPLANT
NDL HYPO 25X1 1.5 SAFETY (NEEDLE) ×1 IMPLANT
NEEDLE 18GX1X1/2 (RX/OR ONLY) (NEEDLE) IMPLANT
NEEDLE HYPO 25X1 1.5 SAFETY (NEEDLE) ×2 IMPLANT
NS IRRIG 1000ML POUR BTL (IV SOLUTION) ×2 IMPLANT
PACK LAMINECTOMY NEURO (CUSTOM PROCEDURE TRAY) ×2 IMPLANT
PAD ARMBOARD 7.5X6 YLW CONV (MISCELLANEOUS) ×7 IMPLANT
SPONGE SURGIFOAM ABS GEL SZ50 (HEMOSTASIS) IMPLANT
SPONGE T-LAP 4X18 ~~LOC~~+RFID (SPONGE) ×2 IMPLANT
STAPLER SKIN PROX WIDE 3.9 (STAPLE) ×2 IMPLANT
STRIP CLOSURE SKIN 1/2X4 (GAUZE/BANDAGES/DRESSINGS) IMPLANT
SUT MNCRL AB 4-0 PS2 18 (SUTURE) IMPLANT
SUT SILK 0 (SUTURE)
SUT SILK 0 MO-6 18XCR BRD 8 (SUTURE) ×1 IMPLANT
SUT SILK 0 TIES 10X30 (SUTURE) IMPLANT
SUT SILK 2 0 TIES 10X30 (SUTURE) IMPLANT
SUT VIC AB 2-0 CP2 18 (SUTURE) ×4 IMPLANT
SYR 10ML LL (SYRINGE) IMPLANT
SYR EPIDURAL 5ML GLASS (SYRINGE) ×1 IMPLANT
TOWEL GREEN STERILE (TOWEL DISPOSABLE) ×2 IMPLANT
TOWEL GREEN STERILE FF (TOWEL DISPOSABLE) ×2 IMPLANT
WATER STERILE IRR 1000ML POUR (IV SOLUTION) ×2 IMPLANT
YANKAUER SUCT BULB TIP NO VENT (SUCTIONS) ×2 IMPLANT

## 2021-12-18 NOTE — Discharge Instructions (Addendum)
Resume home meds ?Keep dressings dry, may sponge bath. ?Up as tolerated ?Call clinic for fevers or any signs of infection ?Resume normal diet ? ?

## 2021-12-18 NOTE — Op Note (Signed)
Preop Dx: Failed Spinal Cord Stimulatore ?Postop Dx: Same ? ?Procedure: Spinal cord stimulator and battery removal ? ?Surgeon: Davy Pique ? ?Anesthesia: General ? ?Procedure in detail: Patient presents for spinal cord stimulator removal.  Questions answered and consent obtained.  Port accessed by staff for IV.  Patient taken to OR, placed under general anesthesia, the carefully positioned prone. Port padded to avoid excessive pressure, all pressure points padded.  Sterile prep and drape in the usual fashion.  Time out performed.  Antibiotics given.  Flouroscopy used to identify anchor placement.  1 inch linear region above anchors anesthetized with 0.25% bupivacaine.  Incision made.  Anchors easily dissected out, cut from battery and removed. Leads noted to be intact.   Next, 2 inch linear region over battery anesthetized with 0.25% bupivacaine.  Incision made.  Battery easily removed.  Each pocked copiously irrigated.  Hemostasis achieved.  Pockets closed with 2-0 interrupted vicryls followed by staples and sterile dressings. ? ?Complications: None ? ?EBL: minimal ? ?Fluids: per anesthesia ? ?Disposition: PACU, then home.  Clinic followu p in 5 days for dressing change. ?

## 2021-12-18 NOTE — Anesthesia Procedure Notes (Signed)
Procedure Name: Intubation ?Date/Time: 12/18/2021 7:43 AM ?Performed by: Bryson Corona, CRNA ?Pre-anesthesia Checklist: Patient identified, Emergency Drugs available, Suction available and Patient being monitored ?Patient Re-evaluated:Patient Re-evaluated prior to induction ?Oxygen Delivery Method: Circle System Utilized ?Preoxygenation: Pre-oxygenation with 100% oxygen ?Induction Type: IV induction ?Ventilation: Mask ventilation without difficulty ?Laryngoscope Size: Mac and 3 ?Grade View: Grade I ?Tube type: Oral ?Tube size: 7.0 mm ?Number of attempts: 1 ?Airway Equipment and Method: Stylet and Oral airway ?Placement Confirmation: ETT inserted through vocal cords under direct vision, positive ETCO2 and breath sounds checked- equal and bilateral ?Secured at: 22 cm ?Tube secured with: Tape ?Dental Injury: Teeth and Oropharynx as per pre-operative assessment  ? ? ? ? ?

## 2021-12-18 NOTE — H&P (Signed)
Cc: I want my stimulator out ? ?HPI: ? ?Patient with spinal cord stimulator desiring removal due to lack of benefit. ? ?PMH: ?Cardiomegaly ?CRPS ?Migraines ? ?Surg: ?SCS implant ? ?ROS: ?As above. No fevers. ? ?FH:  ?[place] ? ?Soc: ?No smoking ? ?PE: ?Alert ?Limited mobility of right ankle, otherwise able to move rest of leg, 5/5 strength bilaterally ?Regular heart rate ?Regular breathing pattern ? ?A/P ?Patient with crps type 1 of right leg, desiring spinal cord stimulator removal ? ? Will remove device today, risks discussed (infection, inability to remove all of devise, risks of anesthesia) ?Patient will follow up in clinic in 5 days for dressing change. ? ? ?

## 2021-12-18 NOTE — Transfer of Care (Signed)
Immediate Anesthesia Transfer of Care Note ? ?Patient: [name] ? ?Procedure(s) Performed: Removal of spinal cord stimulator ? ?Patient Location: PACU ? ?Anesthesia Type:General ? ?Level of Consciousness: awake, alert  and oriented ? ?Airway & Oxygen Therapy: Patient Spontanous Breathing and Patient connected to face mask oxygen ? ?Post-op Assessment: Report given to RN and Post -op Vital signs reviewed and stable ? ?Post vital signs: Reviewed and stable ? ?Last Vitals:  ?Vitals Value Taken Time  ?BP 116/77 12/18/21 0845  ?Temp 36.6 ?C 12/18/21 0845  ?Pulse 83 12/18/21 0849  ?Resp 15 12/18/21 0849  ?SpO2 90 % 12/18/21 0849  ?Vitals shown include unvalidated device data. ? ?Last Pain:  ?Vitals:  ? 12/18/21 0611  ?TempSrc:   ?PainSc: 7   ?   ? ?Patients Stated Pain Goal: 2 (12/18/21 4650) ? ?Complications: No notable events documented. ?

## 2021-12-19 ENCOUNTER — Encounter (HOSPITAL_COMMUNITY): Payer: Self-pay | Admitting: Pain Medicine

## 2021-12-20 NOTE — Anesthesia Postprocedure Evaluation (Signed)
Anesthesia Post Note ? ?Patient: [name] ? ?Procedure(s) Performed: Removal of spinal cord stimulator ? ?  ? ?Patient location during evaluation: PACU ?Anesthesia Type: General ?Level of consciousness: awake and alert ?Pain management: pain level controlled ?Vital Signs Assessment: post-procedure vital signs reviewed and stable ?Respiratory status: spontaneous breathing, nonlabored ventilation, respiratory function stable and patient connected to nasal cannula oxygen ?Cardiovascular status: blood pressure returned to baseline and stable ?Postop Assessment: no apparent nausea or vomiting ?Anesthetic complications: no ? ? ?No notable events documented. ? ?Last Vitals:  ?Vitals:  ? 12/18/21 0900 12/18/21 0915  ?BP: 129/73 123/75  ?Pulse: 83 87  ?Resp: 15 15  ?Temp:  36.6 ?C  ?SpO2: 95% 92%  ?  ?Last Pain:  ?Vitals:  ? 12/18/21 0915  ?TempSrc:   ?PainSc: 2   ? ? ?  ?  ?  ?  ?  ?  ? ?Kassius Battiste ? ? ? ? ?

## 2021-12-25 ENCOUNTER — Ambulatory Visit: Payer: Medicare Other | Admitting: Student

## 2022-03-01 DIAGNOSIS — Z720 Tobacco use: Secondary | ICD-10-CM | POA: Diagnosis not present

## 2022-03-01 DIAGNOSIS — E785 Hyperlipidemia, unspecified: Secondary | ICD-10-CM | POA: Diagnosis not present

## 2022-03-01 DIAGNOSIS — K580 Irritable bowel syndrome with diarrhea: Secondary | ICD-10-CM | POA: Diagnosis not present

## 2022-03-01 DIAGNOSIS — I10 Essential (primary) hypertension: Secondary | ICD-10-CM | POA: Diagnosis not present

## 2022-03-01 DIAGNOSIS — G8929 Other chronic pain: Secondary | ICD-10-CM | POA: Diagnosis not present

## 2022-03-27 ENCOUNTER — Other Ambulatory Visit: Payer: Self-pay | Admitting: Student

## 2022-03-29 DIAGNOSIS — K580 Irritable bowel syndrome with diarrhea: Secondary | ICD-10-CM | POA: Diagnosis not present

## 2022-03-29 DIAGNOSIS — I10 Essential (primary) hypertension: Secondary | ICD-10-CM | POA: Diagnosis not present

## 2022-03-29 DIAGNOSIS — Z720 Tobacco use: Secondary | ICD-10-CM | POA: Diagnosis not present

## 2022-03-29 DIAGNOSIS — G8929 Other chronic pain: Secondary | ICD-10-CM | POA: Diagnosis not present

## 2022-03-29 DIAGNOSIS — E785 Hyperlipidemia, unspecified: Secondary | ICD-10-CM | POA: Diagnosis not present

## 2022-05-19 DIAGNOSIS — E785 Hyperlipidemia, unspecified: Secondary | ICD-10-CM | POA: Diagnosis not present

## 2022-05-19 DIAGNOSIS — J302 Other seasonal allergic rhinitis: Secondary | ICD-10-CM | POA: Diagnosis not present

## 2022-05-19 DIAGNOSIS — I10 Essential (primary) hypertension: Secondary | ICD-10-CM | POA: Diagnosis not present

## 2022-05-19 DIAGNOSIS — Z720 Tobacco use: Secondary | ICD-10-CM | POA: Diagnosis not present

## 2022-05-19 DIAGNOSIS — Z0000 Encounter for general adult medical examination without abnormal findings: Secondary | ICD-10-CM | POA: Diagnosis not present

## 2022-05-19 DIAGNOSIS — K580 Irritable bowel syndrome with diarrhea: Secondary | ICD-10-CM | POA: Diagnosis not present

## 2022-05-19 DIAGNOSIS — G8929 Other chronic pain: Secondary | ICD-10-CM | POA: Diagnosis not present

## 2022-05-27 DIAGNOSIS — G894 Chronic pain syndrome: Secondary | ICD-10-CM | POA: Diagnosis not present

## 2022-05-27 DIAGNOSIS — G90521 Complex regional pain syndrome I of right lower limb: Secondary | ICD-10-CM | POA: Diagnosis not present

## 2022-06-17 DIAGNOSIS — I10 Essential (primary) hypertension: Secondary | ICD-10-CM | POA: Diagnosis not present

## 2022-06-17 DIAGNOSIS — J302 Other seasonal allergic rhinitis: Secondary | ICD-10-CM | POA: Diagnosis not present

## 2022-06-17 DIAGNOSIS — Z0000 Encounter for general adult medical examination without abnormal findings: Secondary | ICD-10-CM | POA: Diagnosis not present

## 2022-06-17 DIAGNOSIS — R21 Rash and other nonspecific skin eruption: Secondary | ICD-10-CM | POA: Diagnosis not present

## 2022-06-17 DIAGNOSIS — G8929 Other chronic pain: Secondary | ICD-10-CM | POA: Diagnosis not present

## 2022-06-17 DIAGNOSIS — E785 Hyperlipidemia, unspecified: Secondary | ICD-10-CM | POA: Diagnosis not present

## 2022-06-17 DIAGNOSIS — Z720 Tobacco use: Secondary | ICD-10-CM | POA: Diagnosis not present

## 2022-06-17 DIAGNOSIS — K580 Irritable bowel syndrome with diarrhea: Secondary | ICD-10-CM | POA: Diagnosis not present

## 2022-06-17 DIAGNOSIS — R7303 Prediabetes: Secondary | ICD-10-CM | POA: Diagnosis not present

## 2022-06-24 ENCOUNTER — Other Ambulatory Visit: Payer: Self-pay | Admitting: Physician Assistant

## 2022-06-24 DIAGNOSIS — M19011 Primary osteoarthritis, right shoulder: Secondary | ICD-10-CM | POA: Diagnosis not present

## 2022-06-24 DIAGNOSIS — M25562 Pain in left knee: Secondary | ICD-10-CM | POA: Diagnosis not present

## 2022-06-24 DIAGNOSIS — M25511 Pain in right shoulder: Secondary | ICD-10-CM | POA: Diagnosis not present

## 2022-06-24 DIAGNOSIS — N63 Unspecified lump in unspecified breast: Secondary | ICD-10-CM

## 2022-06-24 DIAGNOSIS — M1712 Unilateral primary osteoarthritis, left knee: Secondary | ICD-10-CM | POA: Diagnosis not present

## 2022-06-24 DIAGNOSIS — N644 Mastodynia: Secondary | ICD-10-CM

## 2022-06-24 DIAGNOSIS — Z1231 Encounter for screening mammogram for malignant neoplasm of breast: Secondary | ICD-10-CM

## 2022-07-29 DIAGNOSIS — G8929 Other chronic pain: Secondary | ICD-10-CM | POA: Diagnosis not present

## 2022-07-29 DIAGNOSIS — K580 Irritable bowel syndrome with diarrhea: Secondary | ICD-10-CM | POA: Diagnosis not present

## 2022-07-29 DIAGNOSIS — E785 Hyperlipidemia, unspecified: Secondary | ICD-10-CM | POA: Diagnosis not present

## 2022-07-29 DIAGNOSIS — R7303 Prediabetes: Secondary | ICD-10-CM | POA: Diagnosis not present

## 2022-07-29 DIAGNOSIS — H1045 Other chronic allergic conjunctivitis: Secondary | ICD-10-CM | POA: Diagnosis not present

## 2022-07-29 DIAGNOSIS — Z720 Tobacco use: Secondary | ICD-10-CM | POA: Diagnosis not present

## 2022-07-29 DIAGNOSIS — I10 Essential (primary) hypertension: Secondary | ICD-10-CM | POA: Diagnosis not present

## 2022-07-29 DIAGNOSIS — J302 Other seasonal allergic rhinitis: Secondary | ICD-10-CM | POA: Diagnosis not present

## 2022-08-02 ENCOUNTER — Ambulatory Visit
Admission: RE | Admit: 2022-08-02 | Discharge: 2022-08-02 | Disposition: A | Discharge status: Home / Self Care | Payer: Medicare Other | Source: Ambulatory Visit | Attending: Physician Assistant | Admitting: Physician Assistant

## 2022-08-02 ENCOUNTER — Other Ambulatory Visit: Payer: Self-pay | Admitting: Physician Assistant

## 2022-08-02 DIAGNOSIS — N6320 Unspecified lump in the left breast, unspecified quadrant: Secondary | ICD-10-CM

## 2022-08-02 DIAGNOSIS — N644 Mastodynia: Secondary | ICD-10-CM

## 2022-08-02 DIAGNOSIS — N6011 Diffuse cystic mastopathy of right breast: Secondary | ICD-10-CM | POA: Diagnosis not present

## 2022-08-02 DIAGNOSIS — N63 Unspecified lump in unspecified breast: Secondary | ICD-10-CM

## 2022-08-02 DIAGNOSIS — N6321 Unspecified lump in the left breast, upper outer quadrant: Secondary | ICD-10-CM | POA: Diagnosis not present

## 2022-08-02 DIAGNOSIS — R92333 Mammographic heterogeneous density, bilateral breasts: Secondary | ICD-10-CM | POA: Diagnosis not present

## 2022-09-01 DIAGNOSIS — I10 Essential (primary) hypertension: Secondary | ICD-10-CM | POA: Diagnosis not present

## 2022-09-01 DIAGNOSIS — H04123 Dry eye syndrome of bilateral lacrimal glands: Secondary | ICD-10-CM | POA: Diagnosis not present

## 2022-09-01 DIAGNOSIS — J302 Other seasonal allergic rhinitis: Secondary | ICD-10-CM | POA: Diagnosis not present

## 2022-09-01 DIAGNOSIS — R7303 Prediabetes: Secondary | ICD-10-CM | POA: Diagnosis not present

## 2022-09-01 DIAGNOSIS — E785 Hyperlipidemia, unspecified: Secondary | ICD-10-CM | POA: Diagnosis not present

## 2022-09-01 DIAGNOSIS — G8929 Other chronic pain: Secondary | ICD-10-CM | POA: Diagnosis not present

## 2022-09-01 DIAGNOSIS — K580 Irritable bowel syndrome with diarrhea: Secondary | ICD-10-CM | POA: Diagnosis not present

## 2022-09-01 DIAGNOSIS — Z720 Tobacco use: Secondary | ICD-10-CM | POA: Diagnosis not present

## 2022-11-03 DIAGNOSIS — G8929 Other chronic pain: Secondary | ICD-10-CM | POA: Diagnosis not present

## 2022-11-03 DIAGNOSIS — R7303 Prediabetes: Secondary | ICD-10-CM | POA: Diagnosis not present

## 2022-11-03 DIAGNOSIS — J302 Other seasonal allergic rhinitis: Secondary | ICD-10-CM | POA: Diagnosis not present

## 2022-11-03 DIAGNOSIS — H04123 Dry eye syndrome of bilateral lacrimal glands: Secondary | ICD-10-CM | POA: Diagnosis not present

## 2022-11-03 DIAGNOSIS — E785 Hyperlipidemia, unspecified: Secondary | ICD-10-CM | POA: Diagnosis not present

## 2022-11-03 DIAGNOSIS — I10 Essential (primary) hypertension: Secondary | ICD-10-CM | POA: Diagnosis not present

## 2022-11-03 DIAGNOSIS — Z720 Tobacco use: Secondary | ICD-10-CM | POA: Diagnosis not present

## 2022-11-03 DIAGNOSIS — K580 Irritable bowel syndrome with diarrhea: Secondary | ICD-10-CM | POA: Diagnosis not present

## 2022-11-18 DIAGNOSIS — G90521 Complex regional pain syndrome I of right lower limb: Secondary | ICD-10-CM | POA: Diagnosis not present

## 2022-11-18 DIAGNOSIS — G894 Chronic pain syndrome: Secondary | ICD-10-CM | POA: Diagnosis not present

## 2022-12-22 DIAGNOSIS — K580 Irritable bowel syndrome with diarrhea: Secondary | ICD-10-CM | POA: Diagnosis not present

## 2022-12-22 DIAGNOSIS — D509 Iron deficiency anemia, unspecified: Secondary | ICD-10-CM | POA: Diagnosis not present

## 2022-12-22 DIAGNOSIS — R7303 Prediabetes: Secondary | ICD-10-CM | POA: Diagnosis not present

## 2022-12-22 DIAGNOSIS — E559 Vitamin D deficiency, unspecified: Secondary | ICD-10-CM | POA: Diagnosis not present

## 2022-12-22 DIAGNOSIS — I10 Essential (primary) hypertension: Secondary | ICD-10-CM | POA: Diagnosis not present

## 2022-12-22 DIAGNOSIS — J302 Other seasonal allergic rhinitis: Secondary | ICD-10-CM | POA: Diagnosis not present

## 2022-12-22 DIAGNOSIS — Z720 Tobacco use: Secondary | ICD-10-CM | POA: Diagnosis not present

## 2022-12-22 DIAGNOSIS — E785 Hyperlipidemia, unspecified: Secondary | ICD-10-CM | POA: Diagnosis not present

## 2022-12-22 DIAGNOSIS — G8929 Other chronic pain: Secondary | ICD-10-CM | POA: Diagnosis not present

## 2022-12-30 DIAGNOSIS — M1712 Unilateral primary osteoarthritis, left knee: Secondary | ICD-10-CM | POA: Diagnosis not present

## 2023-01-12 DIAGNOSIS — E785 Hyperlipidemia, unspecified: Secondary | ICD-10-CM | POA: Diagnosis not present

## 2023-01-12 DIAGNOSIS — R7303 Prediabetes: Secondary | ICD-10-CM | POA: Diagnosis not present

## 2023-01-12 DIAGNOSIS — I10 Essential (primary) hypertension: Secondary | ICD-10-CM | POA: Diagnosis not present

## 2023-01-13 DIAGNOSIS — G90521 Complex regional pain syndrome I of right lower limb: Secondary | ICD-10-CM | POA: Diagnosis not present

## 2023-01-13 DIAGNOSIS — G894 Chronic pain syndrome: Secondary | ICD-10-CM | POA: Diagnosis not present

## 2023-02-01 ENCOUNTER — Ambulatory Visit
Admission: RE | Admit: 2023-02-01 | Discharge: 2023-02-01 | Disposition: A | Discharge status: Home / Self Care | Payer: 59 | Source: Ambulatory Visit | Attending: Physician Assistant | Admitting: Physician Assistant

## 2023-02-01 ENCOUNTER — Ambulatory Visit
Admission: RE | Admit: 2023-02-01 | Discharge: 2023-02-01 | Disposition: A | Discharge status: Home / Self Care | Payer: Medicare Other | Source: Ambulatory Visit | Attending: Physician Assistant | Admitting: Physician Assistant

## 2023-02-01 DIAGNOSIS — N6320 Unspecified lump in the left breast, unspecified quadrant: Secondary | ICD-10-CM

## 2023-02-01 DIAGNOSIS — N644 Mastodynia: Secondary | ICD-10-CM | POA: Diagnosis not present

## 2023-02-01 DIAGNOSIS — N63 Unspecified lump in unspecified breast: Secondary | ICD-10-CM | POA: Diagnosis not present

## 2023-02-11 ENCOUNTER — Other Ambulatory Visit (HOSPITAL_BASED_OUTPATIENT_CLINIC_OR_DEPARTMENT_OTHER): Payer: Self-pay

## 2023-04-11 DIAGNOSIS — G894 Chronic pain syndrome: Secondary | ICD-10-CM | POA: Diagnosis not present

## 2023-04-11 DIAGNOSIS — G90521 Complex regional pain syndrome I of right lower limb: Secondary | ICD-10-CM | POA: Diagnosis not present

## 2023-06-02 DIAGNOSIS — G8929 Other chronic pain: Secondary | ICD-10-CM | POA: Diagnosis not present

## 2023-06-02 DIAGNOSIS — K580 Irritable bowel syndrome with diarrhea: Secondary | ICD-10-CM | POA: Diagnosis not present

## 2023-06-02 DIAGNOSIS — Z720 Tobacco use: Secondary | ICD-10-CM | POA: Diagnosis not present

## 2023-06-02 DIAGNOSIS — R7303 Prediabetes: Secondary | ICD-10-CM | POA: Diagnosis not present

## 2023-06-02 DIAGNOSIS — E785 Hyperlipidemia, unspecified: Secondary | ICD-10-CM | POA: Diagnosis not present

## 2023-06-02 DIAGNOSIS — J302 Other seasonal allergic rhinitis: Secondary | ICD-10-CM | POA: Diagnosis not present

## 2023-06-02 DIAGNOSIS — I10 Essential (primary) hypertension: Secondary | ICD-10-CM | POA: Diagnosis not present

## 2023-06-09 DIAGNOSIS — M79642 Pain in left hand: Secondary | ICD-10-CM | POA: Diagnosis not present

## 2023-06-16 DIAGNOSIS — I10 Essential (primary) hypertension: Secondary | ICD-10-CM | POA: Diagnosis not present

## 2023-06-16 DIAGNOSIS — R7303 Prediabetes: Secondary | ICD-10-CM | POA: Diagnosis not present

## 2023-06-16 DIAGNOSIS — K580 Irritable bowel syndrome with diarrhea: Secondary | ICD-10-CM | POA: Diagnosis not present

## 2023-06-16 DIAGNOSIS — R232 Flushing: Secondary | ICD-10-CM | POA: Diagnosis not present

## 2023-06-16 DIAGNOSIS — R0781 Pleurodynia: Secondary | ICD-10-CM | POA: Diagnosis not present

## 2023-06-16 DIAGNOSIS — J302 Other seasonal allergic rhinitis: Secondary | ICD-10-CM | POA: Diagnosis not present

## 2023-06-16 DIAGNOSIS — Z720 Tobacco use: Secondary | ICD-10-CM | POA: Diagnosis not present

## 2023-06-16 DIAGNOSIS — G8929 Other chronic pain: Secondary | ICD-10-CM | POA: Diagnosis not present

## 2023-06-16 DIAGNOSIS — E785 Hyperlipidemia, unspecified: Secondary | ICD-10-CM | POA: Diagnosis not present

## 2023-07-01 DIAGNOSIS — G90521 Complex regional pain syndrome I of right lower limb: Secondary | ICD-10-CM | POA: Diagnosis not present

## 2023-07-01 DIAGNOSIS — M5416 Radiculopathy, lumbar region: Secondary | ICD-10-CM | POA: Diagnosis not present

## 2023-07-07 DIAGNOSIS — R5383 Other fatigue: Secondary | ICD-10-CM | POA: Diagnosis not present

## 2023-07-07 DIAGNOSIS — R5381 Other malaise: Secondary | ICD-10-CM | POA: Diagnosis not present

## 2023-07-07 DIAGNOSIS — E559 Vitamin D deficiency, unspecified: Secondary | ICD-10-CM | POA: Diagnosis not present

## 2023-07-07 DIAGNOSIS — K580 Irritable bowel syndrome with diarrhea: Secondary | ICD-10-CM | POA: Diagnosis not present

## 2023-07-07 DIAGNOSIS — J302 Other seasonal allergic rhinitis: Secondary | ICD-10-CM | POA: Diagnosis not present

## 2023-07-07 DIAGNOSIS — R7303 Prediabetes: Secondary | ICD-10-CM | POA: Diagnosis not present

## 2023-07-07 DIAGNOSIS — I10 Essential (primary) hypertension: Secondary | ICD-10-CM | POA: Diagnosis not present

## 2023-07-07 DIAGNOSIS — R0602 Shortness of breath: Secondary | ICD-10-CM | POA: Diagnosis not present

## 2023-07-07 DIAGNOSIS — E785 Hyperlipidemia, unspecified: Secondary | ICD-10-CM | POA: Diagnosis not present

## 2023-07-07 DIAGNOSIS — Z720 Tobacco use: Secondary | ICD-10-CM | POA: Diagnosis not present

## 2023-07-14 DIAGNOSIS — R7303 Prediabetes: Secondary | ICD-10-CM | POA: Diagnosis not present

## 2023-07-14 DIAGNOSIS — R600 Localized edema: Secondary | ICD-10-CM | POA: Diagnosis not present

## 2023-07-14 DIAGNOSIS — I10 Essential (primary) hypertension: Secondary | ICD-10-CM | POA: Diagnosis not present

## 2023-07-19 ENCOUNTER — Ambulatory Visit: Payer: 59 | Attending: Nurse Practitioner | Admitting: Nurse Practitioner

## 2023-07-19 ENCOUNTER — Encounter: Payer: Self-pay | Admitting: Nurse Practitioner

## 2023-07-19 VITALS — BP 130/72 | HR 90 | Ht 62.0 in | Wt 161.8 lb

## 2023-07-19 DIAGNOSIS — I10 Essential (primary) hypertension: Secondary | ICD-10-CM | POA: Diagnosis not present

## 2023-07-19 DIAGNOSIS — R0609 Other forms of dyspnea: Secondary | ICD-10-CM

## 2023-07-19 DIAGNOSIS — I3139 Other pericardial effusion (noninflammatory): Secondary | ICD-10-CM

## 2023-07-19 DIAGNOSIS — R079 Chest pain, unspecified: Secondary | ICD-10-CM | POA: Diagnosis not present

## 2023-07-19 DIAGNOSIS — E782 Mixed hyperlipidemia: Secondary | ICD-10-CM | POA: Diagnosis not present

## 2023-07-19 DIAGNOSIS — R072 Precordial pain: Secondary | ICD-10-CM | POA: Diagnosis not present

## 2023-07-19 NOTE — Patient Instructions (Signed)
Medication Instructions:  Your physician recommends that you continue on your current medications as directed. Please refer to the Current Medication list given to you today.  *If you need a refill on your cardiac medications before your next appointment, please call your pharmacy*   Lab Work: CBC, BMET, BNP, ESR, CRP today  Testing/Procedures: Your physician has requested that you have an echocardiogram. Echocardiography is a painless test that uses sound waves to create images of your heart. It provides your doctor with information about the size and shape of your heart and how well your heart's chambers and valves are working. This procedure takes approximately one hour. There are no restrictions for this procedure. Please do NOT wear cologne, perfume, aftershave, or lotions (deodorant is allowed). Please arrive 15 minutes prior to your appointment time.  How to Prepare for Your Cardiac PET/CT Stress Test:  1. Please do not take these medications before your test:   Medications that may interfere with the cardiac pharmacological stress agent (ex. nitrates - including erectile dysfunction medications, isosorbide mononitrate- [please start to hold this medication the day before the test], tamulosin or beta-blockers) the day of the exam. (Erectile dysfunction medication should be held for at least 72 hrs prior to test) Theophylline containing medications for 12 hours. Dipyridamole 48 hours prior to the test. Your remaining medications may be taken with water.  2. Nothing to eat or drink, except water, 3 hours prior to arrival time.   NO caffeine/decaffeinated products, or chocolate 12 hours prior to arrival.  3. NO perfume, cologne or lotion on chest or abdomen area.          - FEMALES - Please avoid wearing dresses to this appointment.  4. Total time is 1 to 2 hours; you may want to bring reading material for the waiting time.  5. Please report to Radiology at the [facility]  Main Entrance 30 minutes early for your test.  [address] [place], [place] [address]  6. Please report to Radiology at [facility] Main Entrance, medical mall, 30 mins prior to your test.  [address]  [place], [place]  [phone]  Diabetic Preparation:  Hold oral medications. You may take NPH and Lantus insulin. Do not take Humalog or Humulin R (Regular Insulin) the day of your test. Check blood sugars prior to leaving the house. If able to eat breakfast prior to 3 hour fasting, you may take all medications, including your insulin, Do not worry if you miss your breakfast dose of insulin - start at your next meal. Patients who wear a continuous glucose monitor MUST remove the device prior to scanning.  IF YOU THINK YOU MAY BE PREGNANT, OR ARE NURSING PLEASE INFORM THE TECHNOLOGIST.  In preparation for your appointment, medication and supplies will be purchased.  Appointment availability is limited, so if you need to cancel or reschedule, please call the Radiology Department at [phone] [facility]) OR [phone] [facility])  24 hours in advance to avoid a cancellation fee of $100.00  What to Expect After you Arrive:  Once you arrive and check in for your appointment, you will be taken to a preparation room within the Radiology Department.  A technologist or Nurse will obtain your medical history, verify that you are correctly prepped for the exam, and explain the procedure.  Afterwards,  an IV will be started in your arm and electrodes will be placed on your skin for EKG monitoring during the stress portion of  the exam. Then you will be escorted to the PET/CT scanner.  There, staff will get you positioned on the scanner and obtain a blood pressure and EKG.  During the exam, you will continue to be connected to the EKG and blood pressure machines.  A small, safe amount of a radioactive tracer will be injected in your IV to obtain a series of pictures of  your heart along with an injection of a stress agent.    After your Exam:  It is recommended that you eat a meal and drink a caffeinated beverage to counter act any effects of the stress agent.  Drink plenty of fluids for the remainder of the day and urinate frequently for the first couple of hours after the exam.  Your doctor will inform you of your test results within 7-10 business days.  For more information and frequently asked questions, please visit our website : [email or web address]  For questions about your test or how to prepare for your test, please call: Cardiac Imaging Nurse Navigators Office: [phone]    Follow-Up: At [facility], you and your health needs are our priority.  As part of our continuing mission to provide you with exceptional heart care, we have created designated Provider Care Teams.  These Care Teams include your primary Cardiologist (physician) and Advanced Practice Providers (APPs -  Physician Assistants and Nurse Practitioners) who all work together to provide you with the care you need, when you need it.  We recommend signing up for the patient portal called "MyChart".  Sign up information is provided on this After Visit Summary.  MyChart is used to connect with patients for Virtual Visits (Telemedicine).  Patients are able to view lab/test results, encounter notes, upcoming appointments, etc.  Non-urgent messages can be sent to your provider as well.   To learn more about what you can do with MyChart, go to [email or web address].    Your next appointment:   6-8 week(s)  Provider:   Elder Negus, MD  or Bernadene Person NP

## 2023-07-19 NOTE — Progress Notes (Signed)
Office Visit    Patient Name: [name] Date of Encounter: 07/19/2023  Primary Care Provider:  Jackie Plum, MD Primary Cardiologist:  Elder Negus, MD  Chief Complaint    57 year old female with a history of pericardial effusion s/p pericardial window, family history of CAD, hypertension, hyperlipidemia, depression with prior suicide attempt, former EtOH, DDD with chronic pain syndrome, tobacco use, pseudoseizure, anorexia, and migraines who presents for follow-up related to chest pain and dyspnea on exertion.   Past Medical History    Past Medical History:  Diagnosis Date   Anorexia    Anxiety    Depression    DJD (degenerative joint disease) of cervical spine    Endometriosis    ETOH abuse    sober 3 1/2 years   Hyperlipidemia    Hypertension    Migraines    PICC (peripherally inserted central catheter) in place    rt neck   Psychiatric pseudoseizure    Seizures (HCC)    Past Surgical History:  Procedure Laterality Date   ABDOMINAL HYSTERECTOMY     ABDOMINAL SURGERY     APPENDECTOMY     CARPAL TUNNEL RELEASE     2010   CHOLECYSTECTOMY     ESOPHAGOGASTRODUODENOSCOPY N/A 10/06/2014   Procedure: ESOPHAGOGASTRODUODENOSCOPY (EGD);  Surgeon: Louis Meckel, MD;  Location: [facility] ENDOSCOPY;  Service: Endoscopy;  Laterality: N/A;   KNEE SURGERY     laproscopy     NASAL SINUS SURGERY     PERICARDIOCENTESIS N/A 03/16/2021   Procedure: PERICARDIOCENTESIS;  Surgeon: Elder Negus, MD;  Location: [facility] INVASIVE CV LAB;  Service: Cardiovascular;  Laterality: N/A;   SPINAL CORD STIMULATOR REMOVAL N/A 12/18/2021   Procedure: Removal of spinal cord stimulator;  Surgeon: Renaldo Fiddler, MD;  Location: [facility] OR;  Service: Neurosurgery;  Laterality: N/A;  RM 19   VIDEO ASSISTED THORACOSCOPY Right 03/18/2021   Procedure: VIDEO ASSISTED THORACOSCOPY FOR PERICARDIAL WINDOW;  Surgeon: Corliss Skains, MD;  Location: [facility] OR;  Service: Thoracic;  Laterality: Right;   pericardial window    Allergies  Allergies  Allergen Reactions   Aspirin Other (See Comments)    GI bleed.   Coconut (Cocos Nucifera) Rash   Doxycycline Anaphylaxis and Other (See Comments)    Joint damage also   Gabapentin     Other reaction(s): Abdominal Pain, GI Bleed   Imitrex [Sumatriptan Base] Other (See Comments)    Severe hypertension, all triptans   Iodinated Contrast Media Anaphylaxis   Lactose Diarrhea    Other reaction(s): GI Upset (intolerance)   Lidocaine Anaphylaxis    Pt states she does well with Marcaine/Bupivicaine without problem   Prednisone Other (See Comments)    She goes crazy   Sulfa Antibiotics Anaphylaxis   Sulfasalazine Anaphylaxis   Sumatriptan Other (See Comments)    Felt like she was "having a stroke", heart rate and blood pressure "went through the roof"   Levofloxacin Other (See Comments)    Joints hurt   Amoxicillin-Pot Clavulanate Other (See Comments)    Pt can't remember what type of reaction she had, but states her pharmacy has it listed in their allergy list.  12/18/21 Pt has tolerated Ancef in the past.   Caffeine-Sodium Benzoate     Other reaction(s): Dizziness (intolerance)   Other Other (See Comments)    Artificial sugar - migraine   Prochlorperazine Other (See Comments)    Tonic/clonic seizure   Latex Rash   Levofloxacin Rash and Other (See Comments)  Joints hurt   Metrizamide Other (See Comments)   Nsaids Rash and Other (See Comments)    GI bleed   Tolmetin Rash     Labs/Other Studies Reviewed    The following studies were reviewed today:  Cardiac Studies & Procedures   CARDIAC CATHETERIZATION  CARDIAC CATHETERIZATION 03/16/2021  Narrative Images from the original result were not included. Successful pericardiocentesis 1590 cc serosanguinous fluid   Elder Negus, MD Pager: [phone] Office: [phone]     ECHOCARDIOGRAM  PCV ECHOCARDIOGRAM COMPLETE 03/26/2021  Narrative Echocardiogram  03/26/2021: Normal LV systolic function with visual EF 60-65%. Left ventricle cavity is normal in size. Normal global wall motion. Normal diastolic filling pattern, normal LAP. Mild (Grade I) mitral regurgitation. Small pericardial effusion, located posteriorly, without hemodynamic significance. Compared to study dated 03/17/2021 no significant change.            Recent Labs: No results found for requested labs within last 365 days.  Recent Lipid Panel    Component Value Date/Time   CHOL 75 03/16/2021 0525   TRIG 116 03/16/2021 0525   HDL 25 (L) 03/16/2021 0525   CHOLHDL 3.0 03/16/2021 0525   VLDL 23 03/16/2021 0525   LDLCALC 27 03/16/2021 0525    History of Present Illness    57 year old female with the above past medical history including pericardial effusion s/p pericardial window, family history of CAD, hypertension, hyperlipidemia, depression with prior suicide attempt, former EtOH, DDD with chronic pain syndrome, pseudoseizure, tobacco use, anorexia, and migraines.  She was hospitalized in June 2022 in the setting of severe pericardial effusion with evidence of tamponade physiology noted on outpatient echocardiogram.  She subsequently underwent elective pericardiocentesis followed by pericardial window in the setting of recurrent effusion. She was last seen in the office on 06/26/2021 and was stable from a cardiac standpoint. She denied any chest pain or dyspnea.  She presents today for follow-up.  Since her last visit she has been stable overall from a cardiac standpoint, however, over the past 2 months she has noted progressive dyspnea on exertion, fatigue, decreased activity tolerance, chest pressure with exertion, orthopnea, swelling to her bilateral hands and ankles.  She denies any palpitations, dizziness, presyncope, syncope.  She continues to smoke. She is very frustrated by her symptoms and is concerned that she may have recurrent pericarditis.   Home Medications     Current Outpatient Medications  Medication Sig Dispense Refill   acetaminophen (TYLENOL) 500 MG tablet Take 500-1,000 mg by mouth every 6 (six) hours as needed for moderate pain.     amitriptyline (ELAVIL) 100 MG tablet Take 100 mg by mouth at bedtime.     amitriptyline (ELAVIL) 25 MG tablet Take 50 mg by mouth at bedtime.     amLODipine (NORVASC) 10 MG tablet Take 10 mg by mouth daily.     Apple Cider Vinegar 500 MG TABS Take 1,000 mg by mouth in the morning. Gummy     busPIRone (BUSPAR) 15 MG tablet Take 15 mg by mouth 2 (two) times daily.     busPIRone (BUSPAR) 30 MG tablet Take 30 mg by mouth 2 (two) times daily.     carisoprodol (SOMA) 350 MG tablet Take 1 tablet (350 mg total) by mouth 3 (three) times daily. 15 tablet 0   diclofenac Sodium (VOLTAREN) 1 % GEL Apply 2 g topically 4 (four) times daily as needed (pain).     dicyclomine (BENTYL) 20 MG tablet Take 20 mg by mouth 4 (four) times daily.  fluticasone (FLONASE) 50 MCG/ACT nasal spray Place 1 spray into both nostrils in the morning and at bedtime.     gabapentin (NEURONTIN) 300 MG capsule Take 300 mg by mouth at bedtime. Take 3 capsules     hydrochlorothiazide (HYDRODIURIL) 25 MG tablet Take 25 mg by mouth daily.     loperamide (IMODIUM) 2 MG capsule Take 2 mg by mouth as needed.     Melatonin-Pyridoxine (MELATIN PO) Take 20 mg by mouth at bedtime. 10 each gummy     methocarbamol (ROBAXIN) 750 MG tablet Take 1,500 mg by mouth 3 (three) times daily.     methocarbamol (ROBAXIN) 750 MG tablet Take 750 mg by mouth 3 (three) times daily. Take 2 twice daily     Multiple Vitamin (MULTIVITAMIN WITH MINERALS) TABS tablet Take 2 tablets by mouth daily. Woman 50+ advance     NUCYNTA ER 50 MG 12 hr tablet Take 50 mg by mouth. 2-3 times daily     Ondansetron 8 MG FILM Take 8 mg by mouth every 8 (eight) hours as needed for nausea or vomiting.     OVER THE COUNTER MEDICATION Apply 1 application. topically daily as needed (Migraine). Pepperment  oil     OZEMPIC, 2 MG/DOSE, 8 MG/3ML SOPN Inject 0.5 mg into the skin once a week.     Potassium 99 MG TABS Take 99 mg by mouth daily.     QUEtiapine (SEROQUEL) 300 MG tablet Take 300 mg by mouth at bedtime.     rosuvastatin (CRESTOR) 20 MG tablet Take 20 mg by mouth at bedtime.     spironolactone (ALDACTONE) 25 MG tablet TAKE 1/2 TABLET BY MOUTH EVERY DAY 45 tablet 3   tirzepatide (MOUNJARO) 10 MG/0.5ML Pen Inject 10 mg into the skin once a week.     topiramate (TOPAMAX) 25 MG tablet Take 25 mg by mouth daily as needed (Migraine).     buprenorphine (BUTRANS) 15 MCG/HR Place 1 patch onto the skin every 7 (seven) days. (Patient not taking: Reported on 07/19/2023)     No current facility-administered medications for this visit.     Review of Systems    She denies chest pain, palpitations, dyspnea, pnd, orthopnea, n, v, dizziness, syncope, edema, weight gain, or early satiety. All other systems reviewed and are otherwise negative except as noted above.   Physical Exam    VS:  BP 130/72 (BP Location: Left Arm, Patient Position: Sitting)   Pulse 90   Ht 5\' 2"  (1.575 m)   Wt 161 lb 12.8 oz (73.4 kg)   BMI 29.59 kg/m  GEN: Well nourished, well developed, in no acute distress. HEENT: normal. Neck: Supple, no JVD, carotid bruits, or masses. Cardiac: RRR, no murmurs, rubs, or gallops. No clubbing, cyanosis, edema.  Radials/DP/PT 2+ and equal bilaterally.  Respiratory:  Respirations regular and unlabored, clear to auscultation bilaterally. GI: Soft, nontender, nondistended, BS + x 4. MS: no deformity or atrophy. Skin: warm and dry, no rash. Neuro:  Strength and sensation are intact. Psych: Normal affect.  Accessory Clinical Findings    ECG personally reviewed by me today - EKG Interpretation Date/Time:  Tuesday July 19 2023 08:43:39 EDT Ventricular Rate:  90 PR Interval:  156 QRS Duration:  80 QT Interval:  368 QTC Calculation: 450 R Axis:   21  Text Interpretation: Normal  sinus rhythm Possible Left atrial enlargement When compared with ECG of 01-Feb-2019 18:49, No significant change was found Confirmed by Bernadene Person ([id number]) on 07/19/2023 8:52:03 AM  -  no acute changes.   Lab Results  Component Value Date   WBC 9.1 12/18/2021   HGB 16.0 (H) 12/18/2021   HCT 46.2 (H) 12/18/2021   MCV 89.4 12/18/2021   PLT 353 12/18/2021   Lab Results  Component Value Date   CREATININE 0.81 12/18/2021   BUN 10 12/18/2021   NA 134 (L) 12/18/2021   K 3.2 (L) 12/18/2021   CL 95 (L) 12/18/2021   CO2 29 12/18/2021   Lab Results  Component Value Date   ALT 36 03/20/2021   AST 44 (H) 03/20/2021   ALKPHOS 79 03/20/2021   BILITOT 0.6 03/20/2021   Lab Results  Component Value Date   CHOL 75 03/16/2021   HDL 25 (L) 03/16/2021   LDLCALC 27 03/16/2021   TRIG 116 03/16/2021   CHOLHDL 3.0 03/16/2021    Lab Results  Component Value Date   HGBA1C 5.4 10/11/2014    Assessment & Plan    1. History of pericardial effusion/precordial pain/dyspnea on exertion/fatigue: S/p elective pericardiocentesis followed by pericardial window in the setting of recurrent effusion. She notes a 39-month history of progressive dyspnea on exertion, fatigue, decreased activity tolerance, chest pressure with exertion, orthopnea, swelling to her bilateral hands and ankles.  She notes her symptoms are similar to her prior episode of pericarditis.  Generally euvolemic and well compensated on exam.  Her PCP decreased her amlodipine dosing and her swelling to her bilateral hands and ankles has improved.  Will check echocardiogram.  Will check CBC, BMET, BNP, ESR and CRP.  She unfortunately has a history of anaphylaxis with contrast dye.  Therefore, she is not an ideal candidate for coronary CT angiogram.  Through shared decision making, given symptoms, family history of CAD, will pursue cardiac PET stress test to rule out ischemia.  Reviewed ED precautions.  Continue amlodipine, HCTZ, Crestor.  Informed  Consent   Shared Decision Making/Informed Consent The risks [chest pain, shortness of breath, cardiac arrhythmias, dizziness, blood pressure fluctuations, myocardial infarction, stroke/transient ischemic attack, nausea, vomiting, allergic reaction, radiation exposure, metallic taste sensation and life-threatening complications (estimated to be 1 in 10,000)], benefits (risk stratification, diagnosing coronary artery disease, treatment guidance) and alternatives of a cardiac PET stress test were discussed in detail with Ms. Lovins and she agrees to proceed.    3. Hypertension: BP well controlled.  Amlodipine was recently decreased to 5 mg daily in the setting of swelling to hands and ankles bilaterally.  Continue current antihypertensive regimen.   4. Hyperlipidemia: LDL was 27 in 02/2021. No recent LDL on file.  Will request most recent lipid profile from PCP.  Continue Crestor.  5. Disposition: Follow-up in 6 to 8 weeks.      Joylene Grapes, NP 07/19/2023, 9:49 AM

## 2023-07-20 ENCOUNTER — Ambulatory Visit: Payer: 59 | Attending: Nurse Practitioner

## 2023-07-20 DIAGNOSIS — I3139 Other pericardial effusion (noninflammatory): Secondary | ICD-10-CM

## 2023-07-20 DIAGNOSIS — R0609 Other forms of dyspnea: Secondary | ICD-10-CM | POA: Diagnosis not present

## 2023-07-20 LAB — BASIC METABOLIC PANEL
BUN/Creatinine Ratio: 15 (ref 9–23)
BUN: 10 mg/dL (ref 6–24)
CO2: 29 mmol/L (ref 20–29)
Calcium: 10.7 mg/dL — ABNORMAL HIGH (ref 8.7–10.2)
Chloride: 96 mmol/L (ref 96–106)
Creatinine, Ser: 0.67 mg/dL (ref 0.57–1.00)
Glucose: 80 mg/dL (ref 70–99)
Potassium: 3.9 mmol/L (ref 3.5–5.2)
Sodium: 141 mmol/L (ref 134–144)
eGFR: 103 mL/min/{1.73_m2} (ref >59)

## 2023-07-20 LAB — ECHOCARDIOGRAM COMPLETE
AR max vel: 2.68 cm2
AV Area VTI: 2.85 cm2
AV Area mean vel: 2.7 cm2
AV Mean grad: 3 mm[Hg]
AV Peak grad: 5.4 mm[Hg]
Ao pk vel: 1.16 m/s
Area-P 1/2: 4.6 cm2
Calc EF: 55.3 %
S' Lateral: 2.6 cm
Single Plane A2C EF: 57.8 %
Single Plane A4C EF: 57.8 %

## 2023-07-20 LAB — CBC
Hematocrit: 47.8 % — ABNORMAL HIGH (ref 34.0–46.6)
Hemoglobin: 16 g/dL — ABNORMAL HIGH (ref 11.1–15.9)
MCH: 32.9 pg (ref 26.6–33.0)
MCHC: 33.5 g/dL (ref 31.5–35.7)
MCV: 98 fL — ABNORMAL HIGH (ref 79–97)
Platelets: 411 10*3/uL (ref 150–450)
RBC: 4.87 x10E6/uL (ref 3.77–5.28)
RDW: 13.5 % (ref 11.7–15.4)
WBC: 11.2 10*3/uL — ABNORMAL HIGH (ref 3.4–10.8)

## 2023-07-20 LAB — BRAIN NATRIURETIC PEPTIDE: BNP: 3.1 pg/mL (ref 0.0–100.0)

## 2023-07-20 LAB — SEDIMENTATION RATE: Sed Rate: 7 mm/h (ref 0–40)

## 2023-07-20 LAB — C-REACTIVE PROTEIN: CRP: 3 mg/L (ref 0–10)

## 2023-08-14 ENCOUNTER — Encounter (HOSPITAL_COMMUNITY): Payer: Self-pay

## 2023-08-16 ENCOUNTER — Encounter (HOSPITAL_COMMUNITY): Admission: RE | Admit: 2023-08-16 | Payer: 59 | Source: Ambulatory Visit

## 2023-09-01 ENCOUNTER — Ambulatory Visit: Payer: 59 | Admitting: Nurse Practitioner

## 2023-09-23 ENCOUNTER — Encounter (HOSPITAL_COMMUNITY): Payer: Self-pay

## 2023-09-28 ENCOUNTER — Encounter (HOSPITAL_COMMUNITY): Admission: RE | Admit: 2023-09-28 | Payer: 59 | Source: Ambulatory Visit

## 2023-09-30 DIAGNOSIS — G894 Chronic pain syndrome: Secondary | ICD-10-CM | POA: Diagnosis not present

## 2023-09-30 DIAGNOSIS — M51362 Other intervertebral disc degeneration, lumbar region with discogenic back pain and lower extremity pain: Secondary | ICD-10-CM | POA: Diagnosis not present

## 2023-09-30 DIAGNOSIS — G90521 Complex regional pain syndrome I of right lower limb: Secondary | ICD-10-CM | POA: Diagnosis not present

## 2023-10-05 DIAGNOSIS — M51362 Other intervertebral disc degeneration, lumbar region with discogenic back pain and lower extremity pain: Secondary | ICD-10-CM | POA: Diagnosis not present

## 2023-10-05 DIAGNOSIS — M5416 Radiculopathy, lumbar region: Secondary | ICD-10-CM | POA: Diagnosis not present

## 2023-10-17 ENCOUNTER — Encounter (HOSPITAL_COMMUNITY): Payer: Self-pay | Admitting: Cardiology

## 2023-10-25 ENCOUNTER — Emergency Department (HOSPITAL_COMMUNITY): Payer: 59

## 2023-10-25 ENCOUNTER — Other Ambulatory Visit: Payer: Self-pay

## 2023-10-25 ENCOUNTER — Encounter (HOSPITAL_COMMUNITY): Payer: Self-pay | Admitting: Emergency Medicine

## 2023-10-25 ENCOUNTER — Inpatient Hospital Stay (HOSPITAL_COMMUNITY)
Admission: EM | Admit: 2023-10-25 | Discharge: 2023-10-31 | DRG: 392 | Disposition: A | Discharge status: Home / Self Care | Payer: 59 | Attending: Internal Medicine | Admitting: Internal Medicine

## 2023-10-25 DIAGNOSIS — R569 Unspecified convulsions: Secondary | ICD-10-CM | POA: Diagnosis not present

## 2023-10-25 DIAGNOSIS — K5900 Constipation, unspecified: Secondary | ICD-10-CM | POA: Diagnosis not present

## 2023-10-25 DIAGNOSIS — K8689 Other specified diseases of pancreas: Secondary | ICD-10-CM | POA: Diagnosis present

## 2023-10-25 DIAGNOSIS — K838 Other specified diseases of biliary tract: Principal | ICD-10-CM | POA: Diagnosis present

## 2023-10-25 DIAGNOSIS — I110 Hypertensive heart disease with heart failure: Secondary | ICD-10-CM | POA: Diagnosis not present

## 2023-10-25 DIAGNOSIS — F410 Panic disorder [episodic paroxysmal anxiety] without agoraphobia: Secondary | ICD-10-CM | POA: Diagnosis present

## 2023-10-25 DIAGNOSIS — Z882 Allergy status to sulfonamides status: Secondary | ICD-10-CM

## 2023-10-25 DIAGNOSIS — Z90710 Acquired absence of both cervix and uterus: Secondary | ICD-10-CM

## 2023-10-25 DIAGNOSIS — G43909 Migraine, unspecified, not intractable, without status migrainosus: Secondary | ICD-10-CM | POA: Diagnosis present

## 2023-10-25 DIAGNOSIS — Z818 Family history of other mental and behavioral disorders: Secondary | ICD-10-CM

## 2023-10-25 DIAGNOSIS — G4700 Insomnia, unspecified: Secondary | ICD-10-CM | POA: Diagnosis not present

## 2023-10-25 DIAGNOSIS — Z888 Allergy status to other drugs, medicaments and biological substances status: Secondary | ICD-10-CM

## 2023-10-25 DIAGNOSIS — F32A Depression, unspecified: Secondary | ICD-10-CM | POA: Diagnosis not present

## 2023-10-25 DIAGNOSIS — E860 Dehydration: Secondary | ICD-10-CM | POA: Diagnosis not present

## 2023-10-25 DIAGNOSIS — I5032 Chronic diastolic (congestive) heart failure: Secondary | ICD-10-CM | POA: Diagnosis not present

## 2023-10-25 DIAGNOSIS — Z87892 Personal history of anaphylaxis: Secondary | ICD-10-CM

## 2023-10-25 DIAGNOSIS — Z91040 Latex allergy status: Secondary | ICD-10-CM

## 2023-10-25 DIAGNOSIS — Z8249 Family history of ischemic heart disease and other diseases of the circulatory system: Secondary | ICD-10-CM

## 2023-10-25 DIAGNOSIS — Z9152 Personal history of nonsuicidal self-harm: Secondary | ICD-10-CM

## 2023-10-25 DIAGNOSIS — K2970 Gastritis, unspecified, without bleeding: Secondary | ICD-10-CM | POA: Diagnosis present

## 2023-10-25 DIAGNOSIS — Z7985 Long-term (current) use of injectable non-insulin antidiabetic drugs: Secondary | ICD-10-CM

## 2023-10-25 DIAGNOSIS — R1013 Epigastric pain: Secondary | ICD-10-CM | POA: Diagnosis not present

## 2023-10-25 DIAGNOSIS — R112 Nausea with vomiting, unspecified: Secondary | ICD-10-CM | POA: Diagnosis not present

## 2023-10-25 DIAGNOSIS — Z720 Tobacco use: Secondary | ICD-10-CM | POA: Diagnosis present

## 2023-10-25 DIAGNOSIS — R110 Nausea: Secondary | ICD-10-CM | POA: Diagnosis not present

## 2023-10-25 DIAGNOSIS — Z9049 Acquired absence of other specified parts of digestive tract: Secondary | ICD-10-CM | POA: Diagnosis not present

## 2023-10-25 DIAGNOSIS — R0789 Other chest pain: Secondary | ICD-10-CM | POA: Diagnosis not present

## 2023-10-25 DIAGNOSIS — Z886 Allergy status to analgesic agent status: Secondary | ICD-10-CM

## 2023-10-25 DIAGNOSIS — G90521 Complex regional pain syndrome I of right lower limb: Secondary | ICD-10-CM | POA: Diagnosis not present

## 2023-10-25 DIAGNOSIS — K580 Irritable bowel syndrome with diarrhea: Principal | ICD-10-CM | POA: Diagnosis present

## 2023-10-25 DIAGNOSIS — M47812 Spondylosis without myelopathy or radiculopathy, cervical region: Secondary | ICD-10-CM | POA: Diagnosis present

## 2023-10-25 DIAGNOSIS — Z83438 Family history of other disorder of lipoprotein metabolism and other lipidemia: Secondary | ICD-10-CM

## 2023-10-25 DIAGNOSIS — G894 Chronic pain syndrome: Secondary | ICD-10-CM | POA: Diagnosis present

## 2023-10-25 DIAGNOSIS — Z800 Family history of malignant neoplasm of digestive organs: Secondary | ICD-10-CM

## 2023-10-25 DIAGNOSIS — E876 Hypokalemia: Secondary | ICD-10-CM | POA: Diagnosis not present

## 2023-10-25 DIAGNOSIS — Z91011 Allergy to milk products: Secondary | ICD-10-CM

## 2023-10-25 DIAGNOSIS — R937 Abnormal findings on diagnostic imaging of other parts of musculoskeletal system: Secondary | ICD-10-CM | POA: Diagnosis not present

## 2023-10-25 DIAGNOSIS — K219 Gastro-esophageal reflux disease without esophagitis: Secondary | ICD-10-CM | POA: Diagnosis not present

## 2023-10-25 DIAGNOSIS — F17210 Nicotine dependence, cigarettes, uncomplicated: Secondary | ICD-10-CM | POA: Diagnosis present

## 2023-10-25 DIAGNOSIS — Z880 Allergy status to penicillin: Secondary | ICD-10-CM

## 2023-10-25 DIAGNOSIS — K760 Fatty (change of) liver, not elsewhere classified: Secondary | ICD-10-CM | POA: Diagnosis not present

## 2023-10-25 DIAGNOSIS — R197 Diarrhea, unspecified: Secondary | ICD-10-CM | POA: Diagnosis not present

## 2023-10-25 DIAGNOSIS — F331 Major depressive disorder, recurrent, moderate: Secondary | ICD-10-CM | POA: Diagnosis not present

## 2023-10-25 DIAGNOSIS — Z8719 Personal history of other diseases of the digestive system: Secondary | ICD-10-CM

## 2023-10-25 DIAGNOSIS — F411 Generalized anxiety disorder: Secondary | ICD-10-CM | POA: Diagnosis present

## 2023-10-25 DIAGNOSIS — R140 Abdominal distension (gaseous): Secondary | ICD-10-CM | POA: Diagnosis not present

## 2023-10-25 DIAGNOSIS — E785 Hyperlipidemia, unspecified: Secondary | ICD-10-CM | POA: Diagnosis not present

## 2023-10-25 DIAGNOSIS — R079 Chest pain, unspecified: Secondary | ICD-10-CM | POA: Diagnosis not present

## 2023-10-25 DIAGNOSIS — N179 Acute kidney failure, unspecified: Secondary | ICD-10-CM | POA: Diagnosis present

## 2023-10-25 DIAGNOSIS — F1120 Opioid dependence, uncomplicated: Secondary | ICD-10-CM | POA: Diagnosis present

## 2023-10-25 DIAGNOSIS — R109 Unspecified abdominal pain: Secondary | ICD-10-CM | POA: Diagnosis not present

## 2023-10-25 DIAGNOSIS — Z809 Family history of malignant neoplasm, unspecified: Secondary | ICD-10-CM

## 2023-10-25 DIAGNOSIS — F4010 Social phobia, unspecified: Secondary | ICD-10-CM | POA: Diagnosis present

## 2023-10-25 DIAGNOSIS — Z9889 Other specified postprocedural states: Secondary | ICD-10-CM

## 2023-10-25 DIAGNOSIS — Z91018 Allergy to other foods: Secondary | ICD-10-CM

## 2023-10-25 DIAGNOSIS — Z79899 Other long term (current) drug therapy: Secondary | ICD-10-CM

## 2023-10-25 DIAGNOSIS — F502 Bulimia nervosa, unspecified: Secondary | ICD-10-CM | POA: Diagnosis not present

## 2023-10-25 DIAGNOSIS — R1010 Upper abdominal pain, unspecified: Secondary | ICD-10-CM | POA: Diagnosis not present

## 2023-10-25 DIAGNOSIS — D3502 Benign neoplasm of left adrenal gland: Secondary | ICD-10-CM | POA: Diagnosis present

## 2023-10-25 DIAGNOSIS — Z8711 Personal history of peptic ulcer disease: Secondary | ICD-10-CM

## 2023-10-25 DIAGNOSIS — Z91411 Personal history of adult psychological abuse: Secondary | ICD-10-CM

## 2023-10-25 DIAGNOSIS — Z884 Allergy status to anesthetic agent status: Secondary | ICD-10-CM

## 2023-10-25 DIAGNOSIS — E162 Hypoglycemia, unspecified: Secondary | ICD-10-CM | POA: Diagnosis not present

## 2023-10-25 DIAGNOSIS — Z91041 Radiographic dye allergy status: Secondary | ICD-10-CM

## 2023-10-25 DIAGNOSIS — Z9151 Personal history of suicidal behavior: Secondary | ICD-10-CM

## 2023-10-25 DIAGNOSIS — Z881 Allergy status to other antibiotic agents status: Secondary | ICD-10-CM

## 2023-10-25 LAB — URINALYSIS, W/ REFLEX TO CULTURE (INFECTION SUSPECTED)
Bilirubin Urine: NEGATIVE
Glucose, UA: NEGATIVE mg/dL
Hgb urine dipstick: NEGATIVE
Ketones, ur: NEGATIVE mg/dL
Leukocytes,Ua: NEGATIVE
Nitrite: NEGATIVE
Protein, ur: NEGATIVE mg/dL
Specific Gravity, Urine: 1.009 (ref 1.005–1.030)
pH: 6 (ref 5.0–8.0)

## 2023-10-25 LAB — PHOSPHORUS: Phosphorus: 3.9 mg/dL (ref 2.5–4.6)

## 2023-10-25 LAB — COMPREHENSIVE METABOLIC PANEL
ALT: 22 U/L (ref 0–44)
AST: 21 U/L (ref 15–41)
Albumin: 4.9 g/dL (ref 3.5–5.0)
Alkaline Phosphatase: 77 U/L (ref 38–126)
Anion gap: 16 — ABNORMAL HIGH (ref 5–15)
BUN: 10 mg/dL (ref 6–20)
CO2: 22 mmol/L (ref 22–32)
Calcium: 10.5 mg/dL — ABNORMAL HIGH (ref 8.9–10.3)
Chloride: 97 mmol/L — ABNORMAL LOW (ref 98–111)
Creatinine, Ser: 1.1 mg/dL — ABNORMAL HIGH (ref 0.44–1.00)
GFR, Estimated: 59 mL/min — ABNORMAL LOW (ref >60)
Glucose, Bld: 100 mg/dL — ABNORMAL HIGH (ref 70–99)
Potassium: 3.3 mmol/L — ABNORMAL LOW (ref 3.5–5.1)
Sodium: 135 mmol/L (ref 135–145)
Total Bilirubin: 0.6 mg/dL (ref 0.0–1.2)
Total Protein: 7.7 g/dL (ref 6.5–8.1)

## 2023-10-25 LAB — CBC
HCT: 52.1 % — ABNORMAL HIGH (ref 36.0–46.0)
Hemoglobin: 18.6 g/dL — ABNORMAL HIGH (ref 12.0–15.0)
MCH: 32.1 pg (ref 26.0–34.0)
MCHC: 35.7 g/dL (ref 30.0–36.0)
MCV: 89.8 fL (ref 80.0–100.0)
Platelets: 400 10*3/uL (ref 150–400)
RBC: 5.8 MIL/uL — ABNORMAL HIGH (ref 3.87–5.11)
RDW: 12.9 % (ref 11.5–15.5)
WBC: 14.3 10*3/uL — ABNORMAL HIGH (ref 4.0–10.5)
nRBC: 0 % (ref 0.0–0.2)

## 2023-10-25 LAB — RAPID URINE DRUG SCREEN, HOSP PERFORMED
Amphetamines: NOT DETECTED
Barbiturates: NOT DETECTED
Benzodiazepines: NOT DETECTED
Cocaine: NOT DETECTED
Opiates: POSITIVE — AB
Tetrahydrocannabinol: NOT DETECTED

## 2023-10-25 LAB — TROPONIN I (HIGH SENSITIVITY)
Troponin I (High Sensitivity): 4 ng/L (ref <18)
Troponin I (High Sensitivity): 6 ng/L (ref <18)

## 2023-10-25 LAB — OSMOLALITY, URINE: Osmolality, Ur: 220 mosm/kg — ABNORMAL LOW (ref 300–900)

## 2023-10-25 LAB — PROTIME-INR
INR: 0.9 (ref 0.8–1.2)
Prothrombin Time: 12.4 s (ref 11.4–15.2)

## 2023-10-25 LAB — OSMOLALITY: Osmolality: 291 mosm/kg (ref 275–295)

## 2023-10-25 LAB — I-STAT CG4 LACTIC ACID, ED: Lactic Acid, Venous: 1.4 mmol/L (ref 0.5–1.9)

## 2023-10-25 LAB — SODIUM, URINE, RANDOM: Sodium, Ur: 40 mmol/L

## 2023-10-25 LAB — CREATININE, URINE, RANDOM: Creatinine, Urine: 72 mg/dL

## 2023-10-25 LAB — MAGNESIUM: Magnesium: 2.2 mg/dL (ref 1.7–2.4)

## 2023-10-25 LAB — CK: Total CK: 54 U/L (ref 38–234)

## 2023-10-25 LAB — LIPASE, BLOOD: Lipase: 27 U/L (ref 11–51)

## 2023-10-25 MED ORDER — SODIUM CHLORIDE 0.9 % IV BOLUS
1000.0000 mL | Freq: Once | INTRAVENOUS | Status: AC
  Administered 2023-10-25: 1000 mL via INTRAVENOUS

## 2023-10-25 MED ORDER — ONDANSETRON HCL 4 MG/2ML IJ SOLN
4.0000 mg | Freq: Once | INTRAMUSCULAR | Status: AC
  Administered 2023-10-25: 4 mg via INTRAVENOUS
  Filled 2023-10-25: qty 2

## 2023-10-25 MED ORDER — GADOBUTROL 1 MMOL/ML IV SOLN
7.0000 mL | Freq: Once | INTRAVENOUS | Status: AC | PRN
  Administered 2023-10-25: 7 mL via INTRAVENOUS

## 2023-10-25 MED ORDER — LORAZEPAM 2 MG/ML IJ SOLN
2.0000 mg | Freq: Once | INTRAMUSCULAR | Status: AC
  Administered 2023-10-25: 2 mg via INTRAVENOUS
  Filled 2023-10-25: qty 1

## 2023-10-25 MED ORDER — FENTANYL CITRATE PF 50 MCG/ML IJ SOSY
50.0000 ug | PREFILLED_SYRINGE | Freq: Once | INTRAMUSCULAR | Status: AC
Start: 2023-10-25 — End: 2023-10-25
  Administered 2023-10-25: 50 ug via INTRAVENOUS
  Filled 2023-10-25: qty 1

## 2023-10-25 MED ORDER — MORPHINE SULFATE (PF) 4 MG/ML IV SOLN
4.0000 mg | Freq: Once | INTRAVENOUS | Status: AC
  Administered 2023-10-25: 4 mg via INTRAVENOUS
  Filled 2023-10-25: qty 1

## 2023-10-25 MED ORDER — POTASSIUM CHLORIDE 10 MEQ/100ML IV SOLN
10.0000 meq | INTRAVENOUS | Status: AC
  Administered 2023-10-25 (×2): 10 meq via INTRAVENOUS
  Filled 2023-10-25 (×2): qty 100

## 2023-10-25 MED ORDER — ONDANSETRON 4 MG PO TBDP
4.0000 mg | ORAL_TABLET | Freq: Once | ORAL | Status: AC
  Administered 2023-10-25: 4 mg via ORAL
  Filled 2023-10-25: qty 1

## 2023-10-25 NOTE — Assessment & Plan Note (Signed)
-   Spoke about importance of quitting spent 5 minutes discussing options for treatment, prior attempts at quitting, and dangers of smoking  -At this point patient is   interested in quitting  - declines nicotine patch   - nursing tobacco cessation protocol

## 2023-10-25 NOTE — ED Provider Notes (Signed)
 [facility] Provider Note   CSN: [id number] Arrival date & time: 10/25/23  1248     History  Chief Complaint  Patient presents with   Chest Pain    [name] is a 58 y.o. female.   Chest Pain 58 year old female history of depression, anxiety, hypertension, hyperlipidemia, migraines presenting for chest pain, vomiting, diarrhea.  She states for about a week she has had nonbloody vomiting and diarrhea which has improved.  He has history of IBS.  She has some generalized abdominal pain as well.  Today she woke up with some chest pain.  Substernal, nonradiating.  Some associated shortness of breath.  No radiation to her back.  No history of DVT or PE or recent travel.  No swelling in her legs.  No fevers or chills.     Home Medications Prior to Admission medications   Medication Sig Start Date End Date Taking? Authorizing Provider  acetaminophen  (TYLENOL ) 500 MG tablet Take 500-1,000 mg by mouth every 6 (six) hours as needed for moderate pain.    [provider]  amitriptyline  (ELAVIL ) 100 MG tablet Take 100 mg by mouth at bedtime. 06/30/23   [provider]  amitriptyline  (ELAVIL ) 25 MG tablet Take 50 mg by mouth at bedtime.    [provider]  amLODipine  (NORVASC ) 10 MG tablet Take 10 mg by mouth daily.    [provider]  Apple Cider Vinegar 500 MG TABS Take 1,000 mg by mouth in the morning. Gummy    [provider]  buprenorphine (BUTRANS) 15 MCG/HR Place 1 patch onto the skin every 7 (seven) days. Patient not taking: Reported on 07/19/2023 06/16/21   [provider]  busPIRone  (BUSPAR ) 15 MG tablet Take 15 mg by mouth 2 (two) times daily. 06/30/23   [provider]  busPIRone  (BUSPAR ) 30 MG tablet Take 30 mg by mouth 2 (two) times daily.    [provider]  carisoprodol  (SOMA ) 350 MG tablet Take 1 tablet (350 mg total) by mouth 3 (three) times daily. 06/30/21   Emelia Sluder, PA-C  diclofenac Sodium (VOLTAREN) 1 % GEL Apply 2 g topically 4 (four) times daily as needed (pain).    [provider]  dicyclomine  (BENTYL ) 20 MG tablet Take 20 mg by mouth 4 (four) times daily. 06/02/23   [provider]  fluticasone  (FLONASE ) 50 MCG/ACT nasal spray Place 1 spray into both nostrils in the morning and at bedtime. 03/13/19   [provider]  gabapentin  (NEURONTIN ) 300 MG capsule Take 300 mg by mouth at bedtime. Take 3 capsules 01/25/15   [provider]  hydrochlorothiazide  (HYDRODIURIL ) 25 MG tablet Take 25 mg by mouth daily. 03/06/19   [provider]  loperamide  (IMODIUM ) 2 MG capsule Take 2 mg by mouth as needed. 03/13/19   [provider]  Melatonin-Pyridoxine (MELATIN PO) Take 20 mg by mouth at bedtime. 10 each gummy    [provider]  methocarbamol  (ROBAXIN ) 750 MG tablet Take 1,500 mg by mouth 3 (three) times daily.    [provider]  methocarbamol  (ROBAXIN ) 750 MG tablet Take 750 mg by mouth 3 (three) times daily. Take 2 twice daily    [provider]  Multiple Vitamin (MULTIVITAMIN WITH MINERALS) TABS tablet Take 2 tablets by mouth daily. Woman 50+ advance    [provider]  NUCYNTA  ER 50 MG 12 hr tablet Take 50 mg by mouth. 2-3 times daily 07/01/23   [provider]  Ondansetron  8 MG FILM Take 8 mg by mouth every 8 (eight) hours as needed for nausea or vomiting.    [provider]  OVER THE COUNTER MEDICATION Apply 1 application. topically daily as needed (Migraine). Pepperment oil    [provider]  OZEMPIC, 2 MG/DOSE, 8 MG/3ML SOPN Inject 0.5 mg into the skin once a week. 09/23/22   [provider]  Potassium 99 MG TABS Take 99 mg by mouth daily.    [provider]  QUEtiapine  (SEROQUEL ) 300 MG tablet Take 300 mg by mouth at bedtime.    [provider]  rosuvastatin  (CRESTOR ) 20 MG tablet Take 20 mg by mouth at bedtime.     [provider]  spironolactone  (ALDACTONE ) 25 MG tablet TAKE 1/2 TABLET BY MOUTH EVERY DAY 03/29/22   Cantwell, Celeste C, PA-C  tirzepatide [facility]) 10 MG/0.5ML Pen Inject 10 mg into the skin once a week.    [provider]  topiramate  (TOPAMAX ) 25 MG tablet Take 25 mg by mouth daily as needed (Migraine).    [provider]  potassium chloride  SA (KLOR-CON ) 20 MEQ tablet Take 2 tablets (40 mEq total) by mouth 2 (two) times daily. 03/18/21 03/22/21  Patwardhan, Newman PARAS, MD      Allergies    Aspirin , Coconut (cocos nucifera), Doxycycline, Gabapentin , Imitrex [sumatriptan base], Iodinated contrast media, Lactose, Lidocaine , Prednisone , Sulfa antibiotics, Sulfasalazine, Sumatriptan, Levofloxacin, Amoxicillin-pot clavulanate, Caffeine -sodium benzoate, Other, Prochlorperazine , Latex, Levofloxacin, Metrizamide, Nsaids, and Tolmetin    Review of Systems   Review of Systems  Cardiovascular:  Positive for chest pain.  Review of systems completed and notable as per HPI.  ROS otherwise negative.   Physical Exam Updated Vital Signs BP (!) 136/92 (BP Location: Left Arm)   Pulse 99   Temp 97.9 F (36.6 C) (Oral)   Resp 14   Wt 73 kg   SpO2 100%   BMI 29.44 kg/m  Physical Exam Vitals and nursing note reviewed.  Constitutional:      General: She is not in acute distress.    Appearance: She is well-developed.  HENT:     Head: Normocephalic and atraumatic.     Mouth/Throat:     Mouth: Mucous membranes are moist.     Pharynx: Oropharynx is clear.  Eyes:     Extraocular Movements: Extraocular movements intact.     Conjunctiva/sclera: Conjunctivae normal.     Pupils: Pupils are equal, round, and reactive to light.  Cardiovascular:     Rate and Rhythm: Normal rate and regular rhythm.     Pulses: Normal pulses.     Heart sounds: Normal heart sounds. No murmur heard. Pulmonary:     Effort: Pulmonary effort is normal. No respiratory distress.     Breath sounds: Normal  breath sounds.  Abdominal:     Palpations: Abdomen is soft.     Tenderness: There is abdominal tenderness. There is no guarding or rebound.  Musculoskeletal:        General: No swelling.     Cervical back: Neck supple.     Right lower leg: No edema.     Left lower leg: No edema.  Skin:    General: Skin is warm and dry.     Capillary Refill: Capillary refill takes less than 2 seconds.  Neurological:     General: No focal deficit present.     Mental Status: She is alert and oriented to person, place, and time. Mental status is at baseline.  Psychiatric:        Mood and Affect: Mood normal.     ED Results / Procedures / Treatments   Labs (all labs ordered are listed, but only abnormal results are displayed) Labs Reviewed  CBC - Abnormal; Notable for the following components:      Result Value   WBC 14.3 (*)    RBC 5.80 (*)    Hemoglobin 18.6 (*)    HCT 52.1 (*)    All other components within normal limits  COMPREHENSIVE METABOLIC PANEL - Abnormal; Notable for the following components:   Potassium 3.3 (*)    Chloride 97 (*)    Glucose, Bld 100 (*)    Creatinine, Ser 1.10 (*)    Calcium  10.5 (*)    GFR, Estimated 59 (*)    Anion gap 16 (*)    All other components within normal limits  CULTURE, BLOOD (ROUTINE X 2)  CULTURE, BLOOD (ROUTINE X 2)  PROTIME-INR  URINALYSIS, W/ REFLEX TO CULTURE (INFECTION SUSPECTED)  LIPASE, BLOOD  I-STAT CG4 LACTIC ACID, ED  TROPONIN I (HIGH SENSITIVITY)  TROPONIN I (HIGH SENSITIVITY)    EKG EKG Interpretation Date/Time:  Tuesday October 25 2023 13:29:50 EST Ventricular Rate:  111 PR Interval:  158 QRS Duration:  70 QT Interval:  334 QTC Calculation: 454 R Axis:   93  Text Interpretation: Sinus tachycardia Rightward axis Borderline ECG When compared with ECG of 19-Jul-2023 08:43, PREVIOUS ECG IS PRESENT Confirmed by Nicholaus Dolphin [phone]) on 10/25/2023 3:36:41 PM  Radiology DG Chest 2 View Result Date: 10/25/2023 CLINICAL DATA:   Chest pain and pressure. EXAM: CHEST - 2 VIEW COMPARISON:  Chest radiograph dated 06/30/2021. FINDINGS: Right-sided Port-A-Cath with tip at the cavoatrial junction. No focal consolidation, pleural effusion, or pneumothorax. The cardiac silhouette is within normal limits. No acute osseous pathology. IMPRESSION: No active cardiopulmonary disease. Electronically Signed   By: Vanetta Chou M.D.   On: 10/25/2023 15:16    Procedures Procedures    Medications Ordered in ED Medications  ondansetron  (ZOFRAN -ODT) disintegrating tablet 4 mg (4 mg Oral Given 10/25/23 1609)  sodium chloride  0.9 % bolus 1,000 mL (1,000 mLs Intravenous New Bag/Given 10/25/23 1609)  fentaNYL  (SUBLIMAZE ) injection 50 mcg (50 mcg Intravenous Given 10/25/23 1606)    ED Course/ Medical Decision Making/ A&P                                 Medical Decision Making Amount and/or Complexity of Data Reviewed Labs: ordered. Radiology: ordered.  Risk Prescription drug management.   Medical Decision Making:   Janda Cargo is a 58 y.o. female who presented to the ED today with vomiting, diarrhea, chest pain.  All signs reviewed.  On exam she is overall well-appearing.  She had a week of symptoms consistent likely gastroenteritis.  She is slightly tender on exam, evaluate CT scan especially given leukocytosis for possible appendicitis, cholecystitis, diverticulitis.  Clinically not obstructed.  She has had some nonspecific atypical chest pain as well.  Nonexertional.  She has had some associate shortness of breath but no pleuritic pain.  Suspect her mild tachycardia is related to dehydration I have lower suspicion for PE.  She reported history of prior pericardial effusion, I did a bedside ultrasound which did not show any significant pericardial effusion.  Will hydrate, obtain CT scan and reevaluate.  Her first troponin is normal with nonischemic EKG, I lower concern for ACS.  Patient placed on continuous vitals and telemetry  monitoring while in ED which was reviewed periodically.  Reviewed and confirmed nursing documentation for past medical history, family history, social history.  Reassessment and Plan:   Patient remained stable.  Handoff given to Dr. Albertina with plan to reassess and follow-up remainder of workup.   Patient's presentation is most consistent with acute complicated illness / injury requiring diagnostic workup.           Final Clinical Impression(s) / ED Diagnoses Final diagnoses:  Atypical chest pain    Rx / DC Orders ED Discharge Orders     None         Nicholaus [name], MD 10/25/23 [phone]

## 2023-10-25 NOTE — H&P (Signed)
 [name] FMW:[id number] DOB: May 10, 1966 DOA: 10/25/2023     PCP: Rosalea Rosina SAILOR, PA   Outpatient Specialists:   CARDS:   Dr. Newman JINNY Lawrence, MD  Gi pt is trying to change to [facility]  Patient arrived to ER on 10/25/23 at 1248 Referred by Attending Kommor, Lum, MD   Patient coming from:    home Lives alone,       Chief Complaint:   Chief Complaint  Patient presents with   Chest Pain    HPI: [name] is a 58 y.o. female with medical history significant of   Cardiomegaly, migraine, crps type 1 of right leg, depression, HLD, HTNpseudosezures    Presented with  N/V/D Coming from home with n/v/D x 3 days Also endorsing CP since last night mre of an epigastric pain   No fever no cough No improvement with nitroglycerin No SOB   No fever no chills Does not feel sick states her diarrhea is from IBS    Denies significant ETOH intake   Does  smoke  but interested in quitting  half a pack  Lab Results  Component Value Date   SARSCOV2NAA NEGATIVE 03/15/2021   SARSCOV2NAA Comment 09/24/2020   SARSCOV2NAA NEGATIVE 01/30/2019    Regarding pertinent Chronic problems:    Hyperlipidemia - no on statins   Lipid Panel     Component Value Date/Time   CHOL 75 03/16/2021 0525   TRIG 116 03/16/2021 0525   HDL 25 (L) 03/16/2021 0525   CHOLHDL 3.0 03/16/2021 0525   VLDL 23 03/16/2021 0525   LDLCALC 27 03/16/2021 0525     HTN on norvasc , hctz, spironolactone    chronic CHF diastolic  - last echo  Recent Results (from the past 56199 hours)  ECHOCARDIOGRAM COMPLETE   Collection Time: 07/20/23  9:01 AM  Result Value   AR max vel 2.68   AV Peak grad 5.4   Ao pk vel 1.16   S' Lateral 2.60   Area-P 1/2 4.60   AV Area VTI 2.85   AV Mean grad 3.0   Single Plane A4C EF 57.8   Single Plane A2C EF 57.8   Calc EF 55.3   AV Area mean vel 2.70   Est EF 60 - 65%   Narrative      ECHOCARDIOGRAM REPORT     IMPRESSIONS   1. Left ventricular ejection fraction, by  estimation, is 60 to 65%. The left ventricle has normal function. The left ventricle has no regional wall motion abnormalities. Left ventricular diastolic parameters are consistent with Grade I diastolic  dysfunction (impaired relaxation).  2. Right ventricular systolic function is normal. The right ventricular size is normal. There is normal pulmonary artery systolic pressure. The estimated right ventricular systolic pressure is 22.3 mmHg.  3. The mitral valve is normal in structure. No evidence of mitral valve regurgitation. No evidence of mitral stenosis.  4. The aortic valve has an indeterminant number of cusps. Aortic valve regurgitation is not visualized. No aortic stenosis is present.  5. The inferior vena cava is normal in size with greater than 50% respiratory variability, suggesting right atrial pressure of 3 mmHg.              psuedoSeizure DO - now resolved     While in ER:         Lab Orders         Culture, blood (Routine x 2)         CBC  Comprehensive metabolic panel         Protime-INR         Urinalysis, w/ Reflex to Culture (Infection Suspected) -Urine, Clean Catch         Lipase, blood         CK         Magnesium          TSH         Phosphorus         Creatinine, urine, random         Osmolality, urine         Osmolality         Sodium, urine, random         Prealbumin        CXR -  NON acute  CTabd/pelvis -  1. Biliary dilatation extending all the way to the ampullary region, with mild distal pancreatic duct dilatation. Although some of this may be a physiologic response to prior cholecystectomy, this biliary prominence was not present on 02/03/2015 despite that also being a post cholecystectomy examination. Possibility of obstruction in the vicinity of the ampulla or distal pancreatic head is not excluded. Consider MRI abdomen with and without contrast with special attention to the pancreas ampulla, to include MRCP for  further characterization.    Following Medications were ordered in ER: Medications  gadobutrol  (GADAVIST ) 1 MMOL/ML injection 7 mL (has no administration in time range)  ondansetron  (ZOFRAN -ODT) disintegrating tablet 4 mg (4 mg Oral Given 10/25/23 1609)  sodium chloride  0.9 % bolus 1,000 mL (0 mLs Intravenous Stopped 10/25/23 1730)  fentaNYL  (SUBLIMAZE ) injection 50 mcg (50 mcg Intravenous Given 10/25/23 1606)  morphine  (PF) 4 MG/ML injection 4 mg (4 mg Intravenous Given 10/25/23 2004)  ondansetron  (ZOFRAN ) injection 4 mg (4 mg Intravenous Given 10/25/23 2004)  LORazepam  (ATIVAN ) injection 2 mg (2 mg Intravenous Given 10/25/23 2104)    _______________________________________________________ ER Provider Called:    [facility]  Dr. San  They Recommend admit to medicine   Will see in AM   after MRCP     ED Triage Vitals  Encounter Vitals Group     BP 10/25/23 1252 110/83     Systolic BP Percentile --      Diastolic BP Percentile --      Pulse Rate 10/25/23 1252 (!) 107     Resp 10/25/23 1252 20     Temp 10/25/23 1252 98.2 F (36.8 C)     Temp Source 10/25/23 1252 Oral     SpO2 10/25/23 1252 96 %     Weight 10/25/23 1254 160 lb 15 oz (73 kg)     Height --      Head Circumference --      Peak Flow --      Pain Score 10/25/23 1253 7     Pain Loc --      Pain Education --      Exclude from Growth Chart --   UFJK(75)@     _________________________________________ Significant initial  Findings: Abnormal Labs Reviewed  CBC - Abnormal; Notable for the following components:      Result Value   WBC 14.3 (*)    RBC 5.80 (*)    Hemoglobin 18.6 (*)    HCT 52.1 (*)    All other components within normal limits  COMPREHENSIVE METABOLIC PANEL - Abnormal; Notable for the following components:   Potassium 3.3 (*)    Chloride 97 (*)    Glucose, Bld  100 (*)    Creatinine, Ser 1.10 (*)    Calcium  10.5 (*)    GFR, Estimated 59 (*)    Anion gap 16 (*)    All other components within normal  limits  URINALYSIS, W/ REFLEX TO CULTURE (INFECTION SUSPECTED) - Abnormal; Notable for the following components:   Bacteria, UA RARE (*)    All other components within normal limits      _________________________ Troponin   Cardiac Panel (last 3 results) Recent Labs    10/25/23 1404 10/25/23 1950  TROPONINIHS 4 6     ECG: Ordered Personally reviewed and interpreted by me showing: HR : 111 Rhythm: Sinus tachycardia Rightward axis Borderline ECG QTC 454  BNP (last 3 results) Recent Labs    07/19/23 1014  BNP 3.1     COVID-19 Labs  No results for input(s): DDIMER, FERRITIN, LDH, CRP in the last 72 hours.  Lab Results  Component Value Date   SARSCOV2NAA NEGATIVE 03/15/2021   SARSCOV2NAA Comment 09/24/2020   SARSCOV2NAA NEGATIVE 01/30/2019    The recent clinical data is shown below. Vitals:   10/25/23 1830 10/25/23 1900 10/25/23 1957 10/25/23 2000  BP: (!) 131/95 (!) 132/92  (!) 140/94  Pulse: 88 83  95  Resp: 15 16  (!) 27  Temp:   97.8 F (36.6 C)   TempSrc:   Oral   SpO2: 100% 97%  100%  Weight:        WBC     Component Value Date/Time   WBC 14.3 (H) 10/25/2023 1404   LYMPHSABS 4.3 (H) 01/30/2019 2005   MONOABS 0.6 01/30/2019 2005   EOSABS 0.1 01/30/2019 2005   BASOSABS 0.1 01/30/2019 2005    Lactic Acid, Venous    Component Value Date/Time   LATICACIDVEN 1.4 10/25/2023 1416    Procalcitonin   Ordered      UA   no evidence of UTI      Urine analysis:    Component Value Date/Time   COLORURINE YELLOW 10/25/2023 1717   APPEARANCEUR CLEAR 10/25/2023 1717   LABSPEC 1.009 10/25/2023 1717   PHURINE 6.0 10/25/2023 1717   GLUCOSEU NEGATIVE 10/25/2023 1717   HGBUR NEGATIVE 10/25/2023 1717   BILIRUBINUR NEGATIVE 10/25/2023 1717   BILIRUBINUR neg 05/18/2012 1336   KETONESUR NEGATIVE 10/25/2023 1717   PROTEINUR NEGATIVE 10/25/2023 1717   UROBILINOGEN 0.2 01/20/2015 0200   NITRITE NEGATIVE 10/25/2023 1717   LEUKOCYTESUR NEGATIVE  10/25/2023 1717    __________________________________________________________ Recent Labs  Lab 10/25/23 1404  NA 135  K 3.3*  CO2 22  GLUCOSE 100*  BUN 10  CREATININE 1.10*  CALCIUM  10.5*    Cr   Up from baseline see below Lab Results  Component Value Date   CREATININE 1.10 (H) 10/25/2023   CREATININE 0.67 07/19/2023   CREATININE 0.81 12/18/2021    Recent Labs  Lab 10/25/23 1404  AST 21  ALT 22  ALKPHOS 77  BILITOT 0.6  PROT 7.7  ALBUMIN  4.9   Lab Results  Component Value Date   CALCIUM  10.5 (H) 10/25/2023   PHOS 4.9 (H) 01/30/2019    Plt: Lab Results  Component Value Date   PLT 400 10/25/2023    Recent Labs  Lab 10/25/23 1404  WBC 14.3*  HGB 18.6*  HCT 52.1*  MCV 89.8  PLT 400    HG/HCT Up from baseline see below    Component Value Date/Time   HGB 18.6 (H) 10/25/2023 1404   HGB 16.0 (H) 07/19/2023 1014  HCT 52.1 (H) 10/25/2023 1404   HCT 47.8 (H) 07/19/2023 1014   MCV 89.8 10/25/2023 1404   MCV 98 (H) 07/19/2023 1014    Recent Labs  Lab 10/25/23 1258  LIPASE 27   No results for input(s): AMMONIA in the last 168 hours.    .lab  _______________________________________________ Hospitalist was called for admission for   epigastric pain and biliary dilatation     The following Work up has been ordered so far:  Orders Placed This Encounter  Procedures   Culture, blood (Routine x 2)   DG Chest 2 View   CT ABDOMEN PELVIS WO CONTRAST   MR ABDOMEN MRCP W WO CONTAST   CBC   Comprehensive metabolic panel   Protime-INR   Urinalysis, w/ Reflex to Culture (Infection Suspected) -Urine, Clean Catch   Lipase, blood   CK   Magnesium    TSH   Phosphorus   Creatinine, urine, random   Osmolality, urine   Osmolality   Sodium, urine, random   Prealbumin   Document Height and Actual Weight   Notify physician (specify)  Specify: Notify provider for possible Code Sepsis   Consult to gastroenterology   Consult for [facility]  Admission   ED EKG     OTHER Significant initial  Findings:  labs showing:     DM  labs:  HbA1C: No results for input(s): HGBA1C in the last 8760 hours.     CBG (last 3)  No results for input(s): GLUCAP in the last 72 hours.        Cultures:    Component Value Date/Time   SDES FLUID 03/16/2021 0850   SPECREQUEST NONE 03/16/2021 0850   CULT  03/16/2021 0850    NO GROWTH 3 DAYS Performed at [facility], [address] [place], [place] [address]    REPTSTATUS 03/19/2021 FINAL 03/16/2021 0850     Radiological Exams on Admission: CT ABDOMEN PELVIS WO CONTRAST Result Date: 10/25/2023 CLINICAL DATA:  Acute abdominal pain EXAM: CT ABDOMEN AND PELVIS WITHOUT CONTRAST TECHNIQUE: Multidetector CT imaging of the abdomen and pelvis was performed following the standard protocol without IV contrast. RADIATION DOSE REDUCTION: This exam was performed according to the departmental dose-optimization program which includes automated exposure control, adjustment of the mA and/or kV according to patient size and/or use of iterative reconstruction technique. COMPARISON:  02/03/2015 FINDINGS: Lower chest: Unremarkable Hepatobiliary: Cholecystectomy. Common hepatic duct 1.5 cm in diameter on image 22 series 3, formerly about 0.8 cm. Common bile duct 1.1 cm in diameter on image 30 series 3, formerly 0.5 cm. There is potentially some mild intrahepatic biliary dilatation as well. The biliary dilatation extends all the way to the ampullary region. Pancreas: Mild dorsal pancreatic duct dilatation distally, up to 4 mm in diameter on image 54 series 6. Spleen: Unremarkable Adrenals/Urinary Tract: 1.5 by 1.8 cm mass of the medial limb of the left adrenal gland with internal density -1 Hounsfield units, compatible with adrenal adenoma. No further imaging workup of this lesion is indicated. Otherwise unremarkable. Stomach/Bowel: Unremarkable Vascular/Lymphatic: Atherosclerosis is present, including  aortoiliac atherosclerotic disease. Reproductive: Uterus absent. Other: No supplemental non-categorized findings. Musculoskeletal: Disc bulges at L3-4 and L4-5. IMPRESSION: 1. Biliary dilatation extending all the way to the ampullary region, with mild distal pancreatic duct dilatation. Although some of this may be a physiologic response to prior cholecystectomy, this biliary prominence was not present on 02/03/2015 despite that also being a post cholecystectomy examination. Possibility of obstruction in the vicinity of the  ampulla or distal pancreatic head is not excluded. Consider MRI abdomen with and without contrast with special attention to the pancreas ampulla, to include MRCP for further characterization. 2. Benign left adrenal adenoma. No further imaging workup of this lesion is indicated. 3. Disc bulges at L3-4 and L4-5. 4. Aortic atherosclerosis. Electronically Signed   By: Ryan Salvage M.D.   On: 10/25/2023 18:38   DG Chest 2 View Result Date: 10/25/2023 CLINICAL DATA:  Chest pain and pressure. EXAM: CHEST - 2 VIEW COMPARISON:  Chest radiograph dated 06/30/2021. FINDINGS: Right-sided Port-A-Cath with tip at the cavoatrial junction. No focal consolidation, pleural effusion, or pneumothorax. The cardiac silhouette is within normal limits. No acute osseous pathology. IMPRESSION: No active cardiopulmonary disease. Electronically Signed   By: Vanetta Chou M.D.   On: 10/25/2023 15:16   _______________________________________________________________________________________________________ Latest  Blood pressure (!) 140/94, pulse 95, temperature 97.8 F (36.6 C), temperature source Oral, resp. rate (!) 27, weight 73 kg, SpO2 100%.   Vitals  labs and radiology finding personally reviewed  Review of Systems:    Pertinent positives include:   abdominal pain, nausea, vomiting, diarrhea,   Constitutional:  No weight loss, night sweats, Fevers, chills, fatigue, weight loss  HEENT:  No  headaches, Difficulty swallowing,Tooth/dental problems,Sore throat,  No sneezing, itching, ear ache, nasal congestion, post nasal drip,  Cardio-vascular:  No chest pain, Orthopnea, PND, anasarca, dizziness, palpitations.no Bilateral lower extremity swelling  GI:  No heartburn, indigestion,change in bowel habits, loss of appetite, melena, blood in stool, hematemesis Resp:  no shortness of breath at rest. No dyspnea on exertion, No excess mucus, no productive cough, No non-productive cough, No coughing up of blood.No change in color of mucus.No wheezing. Skin:  no rash or lesions. No jaundice GU:  no dysuria, change in color of urine, no urgency or frequency. No straining to urinate.  No flank pain.  Musculoskeletal:  No joint pain or no joint swelling. No decreased range of motion. No back pain.  Psych:  No change in mood or affect. No depression or anxiety. No memory loss.  Neuro: no localizing neurological complaints, no tingling, no weakness, no double vision, no gait abnormality, no slurred speech, no confusion  All systems reviewed and apart from HOPI all are negative _______________________________________________________________________________________________ Past Medical History:   Past Medical History:  Diagnosis Date   Anorexia    Anxiety    Depression    DJD (degenerative joint disease) of cervical spine    Endometriosis    ETOH abuse    sober 3 1/2 years   Hyperlipidemia    Hypertension    Migraines    PICC (peripherally inserted central catheter) in place    rt neck   Psychiatric pseudoseizure    Seizures (HCC)       Past Surgical History:  Procedure Laterality Date   ABDOMINAL HYSTERECTOMY     ABDOMINAL SURGERY     APPENDECTOMY     CARPAL TUNNEL RELEASE     2010   CHOLECYSTECTOMY     ESOPHAGOGASTRODUODENOSCOPY N/A 10/06/2014   Procedure: ESOPHAGOGASTRODUODENOSCOPY (EGD);  Surgeon: Lamar JONETTA Aho, MD;  Location: [facility] ENDOSCOPY;  Service: Endoscopy;   Laterality: N/A;   KNEE SURGERY     laproscopy     NASAL SINUS SURGERY     PERICARDIOCENTESIS N/A 03/16/2021   Procedure: PERICARDIOCENTESIS;  Surgeon: Elmira Newman PARAS, MD;  Location: [facility] INVASIVE CV LAB;  Service: Cardiovascular;  Laterality: N/A;   SPINAL CORD STIMULATOR REMOVAL N/A 12/18/2021   Procedure:  Removal of spinal cord stimulator;  Surgeon: Darlis Deatrice RAMAN, MD;  Location: [facility] OR;  Service: Neurosurgery;  Laterality: N/A;  RM 19   VIDEO ASSISTED THORACOSCOPY Right 03/18/2021   Procedure: VIDEO ASSISTED THORACOSCOPY FOR PERICARDIAL WINDOW;  Surgeon: Shyrl Linnie KIDD, MD;  Location: [facility] OR;  Service: Thoracic;  Laterality: Right;  pericardial window    Social History:  Ambulatory   independently      reports that she has been smoking cigarettes. She started smoking about 21 years ago. She has a 3 pack-year smoking history. She has never used smokeless tobacco. She reports that she does not currently use alcohol. She reports that she does not currently use drugs.     Family History:  Family History  Problem Relation Age of Onset   Hypertension Mother    Hyperlipidemia Mother    Heart failure Father    Heart attack Father    Cancer Other    Hypertension Half-Sister    Hyperlipidemia Half-Sister    Hypertension Half-Sister    Hyperlipidemia Half-Sister    ______________________________________________________________________________________________ Allergies: Allergies  Allergen Reactions   Aspirin  Other (See Comments)    GI bleed.   Coconut (Cocos Nucifera) Rash   Doxycycline Anaphylaxis and Other (See Comments)    Joint damage also   Gabapentin      Other reaction(s): Abdominal Pain, GI Bleed   Imitrex [Sumatriptan Base] Other (See Comments)    Severe hypertension, all triptans   Iodinated Contrast Media Anaphylaxis   Lactose Diarrhea    Other reaction(s): GI Upset (intolerance)   Lidocaine  Anaphylaxis    Pt states she does well with Marcaine /Bupivicaine  without problem   Prednisone  Other (See Comments)    She goes crazy   Sulfa Antibiotics Anaphylaxis   Sulfasalazine Anaphylaxis   Sumatriptan Other (See Comments)    Felt like she was having a stroke, heart rate and blood pressure went through the roof   Levofloxacin Other (See Comments)    Joints hurt   Amoxicillin-Pot Clavulanate Other (See Comments)    Pt can't remember what type of reaction she had, but states her pharmacy has it listed in their allergy list.  12/18/21 Pt has tolerated Ancef  in the past.   Caffeine -Sodium Benzoate     Other reaction(s): Dizziness (intolerance)   Other Other (See Comments)    Artificial sugar - migraine   Prochlorperazine  Other (See Comments)    Tonic/clonic seizure   Latex Rash   Levofloxacin Rash and Other (See Comments)    Joints hurt   Metrizamide Other (See Comments)   Nsaids Rash and Other (See Comments)    GI bleed   Tolmetin Rash     Prior to Admission medications   Medication Sig Start Date End Date Taking? Authorizing Provider  acetaminophen  (TYLENOL ) 500 MG tablet Take 500-1,000 mg by mouth every 6 (six) hours as needed for moderate pain.    [provider]  amitriptyline  (ELAVIL ) 100 MG tablet Take 100 mg by mouth at bedtime. 06/30/23   [provider]  amitriptyline  (ELAVIL ) 25 MG tablet Take 50 mg by mouth at bedtime.    [provider]  amLODipine  (NORVASC ) 10 MG tablet Take 10 mg by mouth daily.    [provider]  Apple Cider Vinegar 500 MG TABS Take 1,000 mg by mouth in the morning. Gummy    [provider]  buprenorphine (BUTRANS) 15 MCG/HR Place 1 patch onto the skin every 7 (seven) days. Patient not taking: Reported on 07/19/2023 06/16/21  [provider]  busPIRone  (BUSPAR ) 15 MG tablet Take 15 mg by mouth 2 (two) times daily. 06/30/23   [provider]  busPIRone  (BUSPAR ) 30 MG tablet Take 30 mg by mouth 2 (two) times daily.    [provider]   carisoprodol  (SOMA ) 350 MG tablet Take 1 tablet (350 mg total) by mouth 3 (three) times daily. 06/30/21   Emelia Sluder, PA-C  diclofenac Sodium (VOLTAREN) 1 % GEL Apply 2 g topically 4 (four) times daily as needed (pain).    [provider]  dicyclomine  (BENTYL ) 20 MG tablet Take 20 mg by mouth 4 (four) times daily. 06/02/23   [provider]  fluticasone  (FLONASE ) 50 MCG/ACT nasal spray Place 1 spray into both nostrils in the morning and at bedtime. 03/13/19   [provider]  gabapentin  (NEURONTIN ) 300 MG capsule Take 300 mg by mouth at bedtime. Take 3 capsules 01/25/15   [provider]  hydrochlorothiazide  (HYDRODIURIL ) 25 MG tablet Take 25 mg by mouth daily. 03/06/19   [provider]  loperamide  (IMODIUM ) 2 MG capsule Take 2 mg by mouth as needed. 03/13/19   [provider]  Melatonin-Pyridoxine (MELATIN PO) Take 20 mg by mouth at bedtime. 10 each gummy    [provider]  methocarbamol  (ROBAXIN ) 750 MG tablet Take 1,500 mg by mouth 3 (three) times daily.    [provider]  methocarbamol  (ROBAXIN ) 750 MG tablet Take 750 mg by mouth 3 (three) times daily. Take 2 twice daily    [provider]  Multiple Vitamin (MULTIVITAMIN WITH MINERALS) TABS tablet Take 2 tablets by mouth daily. Woman 50+ advance    [provider]  NUCYNTA  ER 50 MG 12 hr tablet Take 50 mg by mouth. 2-3 times daily 07/01/23   [provider]  Ondansetron  8 MG FILM Take 8 mg by mouth every 8 (eight) hours as needed for nausea or vomiting.    [provider]  OVER THE COUNTER MEDICATION Apply 1 application. topically daily as needed (Migraine). Pepperment oil    [provider]  OZEMPIC, 2 MG/DOSE, 8 MG/3ML SOPN Inject 0.5 mg into the skin once a week. 09/23/22   [provider]  Potassium 99 MG TABS Take 99 mg by mouth daily.    [provider]  QUEtiapine  (SEROQUEL ) 300 MG tablet Take 300 mg by mouth  at bedtime.    [provider]  rosuvastatin  (CRESTOR ) 20 MG tablet Take 20 mg by mouth at bedtime.    [provider]  spironolactone  (ALDACTONE ) 25 MG tablet TAKE 1/2 TABLET BY MOUTH EVERY DAY 03/29/22   Cantwell, Celeste C, PA-C  tirzepatide [facility]) 10 MG/0.5ML Pen Inject 10 mg into the skin once a week.    [provider]  topiramate  (TOPAMAX ) 25 MG tablet Take 25 mg by mouth daily as needed (Migraine).    [provider]  potassium chloride  SA (KLOR-CON ) 20 MEQ tablet Take 2 tablets (40 mEq total) by mouth 2 (two) times daily. 03/18/21 03/22/21  Elmira Newman PARAS, MD    ___________________________________________________________________________________________________ Physical Exam:    10/25/2023    8:00 PM 10/25/2023    7:00 PM 10/25/2023    6:30 PM  Vitals with BMI  Systolic 140 132 868  Diastolic 94 92 95  Pulse 95 83 88     1. General:  in No  Acute distress   well   -appearing 2. Psychological: Alert and   Oriented 3. Head/ENT: Dry Mucous Membranes  Head Non traumatic, neck supple                       Poor Dentition 4. SKIN:  decreased Skin turgor,  Skin clean Dry and intact no rash    5. Heart: Regular rate and rhythm no  Murmur, no Rub or gallop 6. Lungs:  no wheezes or crackles   7. Abdomen: Soft,  non-tender, Non distended   obese  bowel sounds present 8. Lower extremities: no clubbing, cyanosis, no  edema 9. Neurologically Grossly intact, moving all 4 extremities equally  10. MSK: Normal range of motion    Chart has been reviewed  ______________________________________________________________________________________________  Assessment/Plan  58 y.o. female with medical history significant of   Cardiomegaly, migraine, crps type 1 of right leg, depression, HLD, HTNpseudosezures   Admitted for epigastric pain and biliary dilatation    Present on Admission:  Hypokalemia  Diarrhea  Dilation of biliary  tract  Dehydration  AKI (acute kidney injury) (HCC)  Depression  Tobacco abuse     Hypokalemia - will replace electrolytes and repeat  check Mg, phos and Ca level and replace as needed Monitor on telemetry   Lab Results  Component Value Date   K 3.3 (L) 10/25/2023     Lab Results  Component Value Date   CREATININE 1.10 (H) 10/25/2023   Lab Results  Component Value Date   MG 2.4 03/18/2021   Lab Results  Component Value Date   CALCIUM  10.5 (H) 10/25/2023   PHOS 4.9 (H) 01/30/2019     Diarrhea Order gastric panel  Seizure-like activity (HCC) Pseudoseizures stable  Dilation of biliary tract  [facility] consult MRCP pending  Dehydration Will rehydrate and follow fluid status  AKI (acute kidney injury) (HCC) In the setting of dehydration   Depression Behavioral health consult  Tobacco abuse  - Spoke about importance of quitting spent 5 minutes discussing options for treatment, prior attempts at quitting, and dangers of smoking  -At this point patient is    interested in quitting  - declines nicotine patch   - nursing tobacco cessation protocol    Other plan as per orders.  DVT prophylaxis:  SCD        Code Status:    Code Status: Prior FULL CODE  as per patient   I had personally discussed CODE STATUS with patient   ACP   none   Family Communication:   Family not at  Bedside    Diet npo   Disposition Plan:    Following barriers for discharge:                                                       Will need consultants to evaluate patient prior to discharge                         Behavioral health  consulted                    Consults called:  [facility]   Treatment Team:  Cirigliano, Vito V, DO  Admission status:  ED Disposition     ED Disposition  Admit   Condition  --   Comment  Hospital Area: [facility] [[id number]]  Level of  Care: Telemetry Medical [104]  May place patient in observation at [facility] or  [facility] if equivalent level of care is available:: No  Covid Evaluation: Asymptomatic - no recent exposure (last 10 days) testing not required  Diagnosis: Dilation of biliary tract [269592]  Admitting Physician: Adisson Deak [[id number]]  Attending Physician: Braelynn Benning [[id number]]          Obs    Level of care     tele  For 12H    Lab Results  Component Value Date   SARSCOV2NAA NEGATIVE 03/15/2021      Tanicka Bisaillon 10/25/2023, 11:17 PM    [facility]     after 2 AM please page floor coverage PA If 7AM-7PM, please contact the day team taking care of the patient using [email or web address]

## 2023-10-25 NOTE — ED Provider Notes (Signed)
  Physical Exam  BP (!) 140/94   Pulse 95   Temp 97.8 F (36.6 C) (Oral)   Resp (!) 27   Wt 73 kg   SpO2 100%   BMI 29.44 kg/m   Physical Exam Vitals and nursing note reviewed.  Constitutional:      General: She is not in acute distress.    Appearance: She is well-developed.  HENT:     Head: Normocephalic and atraumatic.  Eyes:     Conjunctiva/sclera: Conjunctivae normal.  Cardiovascular:     Rate and Rhythm: Normal rate and regular rhythm.     Heart sounds: No murmur heard. Pulmonary:     Effort: Pulmonary effort is normal. No respiratory distress.     Breath sounds: Normal breath sounds.  Abdominal:     Palpations: Abdomen is soft.     Tenderness: There is abdominal tenderness.  Musculoskeletal:        General: No swelling.     Cervical back: Neck supple.  Skin:    General: Skin is warm and dry.     Capillary Refill: Capillary refill takes less than 2 seconds.  Neurological:     Mental Status: She is alert.  Psychiatric:        Mood and Affect: Mood normal.     Procedures  Procedures  ED Course / MDM    Medical Decision Making Amount and/or Complexity of Data Reviewed Labs: ordered. Radiology: ordered.  Risk Prescription drug management.   Patient received in handoff.  Right upper quadrant pain pending completion of imaging studies.  CT imaging concerning for new severe biliary dilatation of unknown origin.  Patient with persistent right upper quadrant abdominal pain nausea and vomiting requiring additional pain medications in the ED.  Spoke with [facility] gastroenterology is recommending MRCP.  Patient require hospital admission for completion of MRI imaging and persistent nausea vomiting abdominal pain.       Albertina Dixon, MD 10/25/23 2141

## 2023-10-25 NOTE — Assessment & Plan Note (Signed)
Pseudoseizures stable

## 2023-10-25 NOTE — Assessment & Plan Note (Signed)
Will rehydrate and follow fluid status °

## 2023-10-25 NOTE — ED Notes (Signed)
Pt currently still in MRI at this time

## 2023-10-25 NOTE — Assessment & Plan Note (Signed)
-   will replace electrolytes and repeat  check Mg, phos and Ca level and replace as needed Monitor on telemetry   Lab Results  Component Value Date   K 3.3 (L) 10/25/2023     Lab Results  Component Value Date   CREATININE 1.10 (H) 10/25/2023   Lab Results  Component Value Date   MG 2.4 03/18/2021   Lab Results  Component Value Date   CALCIUM  10.5 (H) 10/25/2023   PHOS 4.9 (H) 01/30/2019

## 2023-10-25 NOTE — Assessment & Plan Note (Signed)
 Behavioral health consult

## 2023-10-25 NOTE — ED Triage Notes (Signed)
Pt presents via EMS from home for N/V/D x 3 days, CP since last night. Denies fever, cough EMS provided nitroglycerin x 1 without relief  H/o IBS and anxiety  She has  a port 138/98, 96%RA, 20RR, 90s bpm

## 2023-10-25 NOTE — ED Notes (Signed)
 Called ED lab tech, advised pt is a very difficulty stick, pt has orders for VBG and needs a light green and dark green top for added on labs, lab stated they will come get labs in a few, Rachel lab tech advised will be able to obtain current labs along with the morning labs as well.

## 2023-10-25 NOTE — ED Notes (Signed)
 Patient transported to MRI

## 2023-10-25 NOTE — ED Provider Triage Note (Signed)
 Emergency Medicine Provider Triage Evaluation Note  [name] , a 58 y.o. female  was evaluated in triage.  Pt complains of chest pain.  Patient states she has had 3 days of nausea, vomiting, diarrhea all nonbloody.  She has history of IBS.  She also has developed some chest pain today.  No significant shortness of breath.  No fever or cough.  Mild epigastric abdominal pain.  She has been anxious  Review of Systems  Positive: Chest pain, vomiting, diarrhea Negative: Shortness of breath, fever  Physical Exam  BP 110/83 (BP Location: Left Arm)   Pulse (!) 107   Temp 98.2 F (36.8 C) (Oral)   Resp 20   Wt 73 kg   SpO2 96%   BMI 29.44 kg/m  Gen:   Awake, no distress   Resp:  Normal effort, clear lungs and heart sounds MSK:   Moves extremities without difficulty  Other:  Mild epigastric tenderness  Medical Decision Making  Medically screening exam initiated at 1:30 PM.  Appropriate orders placed.  [name] was informed that the remainder of the evaluation will be completed by another provider, this initial triage assessment does not replace that evaluation, and the importance of remaining in the ED until their evaluation is complete.  Patient here with chest pain, vomiting, diarrhea.  EKG nonischemic, chest pain is atypical will evaluate chest x-ray, troponin.  She has mild epigastric tenderness but no other tenderness including no tenderness of the appendix or gallbladder.  Will obtain lab workup, treat symptomatically.   [name] DEL, MD 10/25/23 1332

## 2023-10-25 NOTE — Assessment & Plan Note (Signed)
In the setting of dehydration.

## 2023-10-25 NOTE — Subjective & Objective (Addendum)
Coming from home with n/v/D x 3 days Also endorsing CP since last night mre of an epigastric pain   No fever no cough No improvement with nitroglycerin No SOB

## 2023-10-25 NOTE — Assessment & Plan Note (Signed)
Apprecaite [facility] consult MRCP pending

## 2023-10-25 NOTE — Assessment & Plan Note (Signed)
Order gastric panel ? ?

## 2023-10-25 NOTE — ED Notes (Signed)
Pt has return from MRI, NAD noted, pt reports is feeling better after ativan given in MRI, pt palced back on monitoring devices.

## 2023-10-26 ENCOUNTER — Observation Stay (HOSPITAL_COMMUNITY): Payer: 59

## 2023-10-26 DIAGNOSIS — E860 Dehydration: Secondary | ICD-10-CM | POA: Diagnosis not present

## 2023-10-26 DIAGNOSIS — K219 Gastro-esophageal reflux disease without esophagitis: Secondary | ICD-10-CM

## 2023-10-26 DIAGNOSIS — F331 Major depressive disorder, recurrent, moderate: Secondary | ICD-10-CM | POA: Diagnosis not present

## 2023-10-26 DIAGNOSIS — I5032 Chronic diastolic (congestive) heart failure: Secondary | ICD-10-CM | POA: Diagnosis present

## 2023-10-26 DIAGNOSIS — N179 Acute kidney failure, unspecified: Secondary | ICD-10-CM | POA: Diagnosis not present

## 2023-10-26 DIAGNOSIS — K8689 Other specified diseases of pancreas: Secondary | ICD-10-CM | POA: Diagnosis present

## 2023-10-26 DIAGNOSIS — E876 Hypokalemia: Secondary | ICD-10-CM | POA: Diagnosis not present

## 2023-10-26 DIAGNOSIS — R079 Chest pain, unspecified: Secondary | ICD-10-CM

## 2023-10-26 DIAGNOSIS — R1010 Upper abdominal pain, unspecified: Secondary | ICD-10-CM | POA: Diagnosis not present

## 2023-10-26 DIAGNOSIS — K580 Irritable bowel syndrome with diarrhea: Secondary | ICD-10-CM | POA: Diagnosis not present

## 2023-10-26 DIAGNOSIS — R569 Unspecified convulsions: Secondary | ICD-10-CM

## 2023-10-26 DIAGNOSIS — F1120 Opioid dependence, uncomplicated: Secondary | ICD-10-CM | POA: Diagnosis present

## 2023-10-26 DIAGNOSIS — R0789 Other chest pain: Secondary | ICD-10-CM | POA: Diagnosis present

## 2023-10-26 DIAGNOSIS — F32A Depression, unspecified: Secondary | ICD-10-CM | POA: Diagnosis present

## 2023-10-26 DIAGNOSIS — K3189 Other diseases of stomach and duodenum: Secondary | ICD-10-CM | POA: Diagnosis not present

## 2023-10-26 DIAGNOSIS — K760 Fatty (change of) liver, not elsewhere classified: Secondary | ICD-10-CM | POA: Diagnosis present

## 2023-10-26 DIAGNOSIS — R112 Nausea with vomiting, unspecified: Secondary | ICD-10-CM | POA: Diagnosis present

## 2023-10-26 DIAGNOSIS — R937 Abnormal findings on diagnostic imaging of other parts of musculoskeletal system: Secondary | ICD-10-CM | POA: Diagnosis present

## 2023-10-26 DIAGNOSIS — K2970 Gastritis, unspecified, without bleeding: Secondary | ICD-10-CM | POA: Diagnosis not present

## 2023-10-26 DIAGNOSIS — F4010 Social phobia, unspecified: Secondary | ICD-10-CM | POA: Diagnosis present

## 2023-10-26 DIAGNOSIS — R1013 Epigastric pain: Secondary | ICD-10-CM | POA: Diagnosis not present

## 2023-10-26 DIAGNOSIS — E785 Hyperlipidemia, unspecified: Secondary | ICD-10-CM | POA: Diagnosis present

## 2023-10-26 DIAGNOSIS — D3502 Benign neoplasm of left adrenal gland: Secondary | ICD-10-CM | POA: Diagnosis present

## 2023-10-26 DIAGNOSIS — R197 Diarrhea, unspecified: Secondary | ICD-10-CM | POA: Diagnosis not present

## 2023-10-26 DIAGNOSIS — F410 Panic disorder [episodic paroxysmal anxiety] without agoraphobia: Secondary | ICD-10-CM | POA: Diagnosis present

## 2023-10-26 DIAGNOSIS — G894 Chronic pain syndrome: Secondary | ICD-10-CM | POA: Diagnosis present

## 2023-10-26 DIAGNOSIS — F411 Generalized anxiety disorder: Secondary | ICD-10-CM | POA: Diagnosis present

## 2023-10-26 DIAGNOSIS — K5900 Constipation, unspecified: Secondary | ICD-10-CM | POA: Diagnosis not present

## 2023-10-26 DIAGNOSIS — F502 Bulimia nervosa, unspecified: Secondary | ICD-10-CM | POA: Diagnosis not present

## 2023-10-26 DIAGNOSIS — K31A19 Gastric intestinal metaplasia without dysplasia, unspecified site: Secondary | ICD-10-CM | POA: Diagnosis not present

## 2023-10-26 DIAGNOSIS — Z720 Tobacco use: Secondary | ICD-10-CM | POA: Diagnosis not present

## 2023-10-26 DIAGNOSIS — F17210 Nicotine dependence, cigarettes, uncomplicated: Secondary | ICD-10-CM | POA: Diagnosis present

## 2023-10-26 DIAGNOSIS — K838 Other specified diseases of biliary tract: Secondary | ICD-10-CM | POA: Diagnosis not present

## 2023-10-26 DIAGNOSIS — G90521 Complex regional pain syndrome I of right lower limb: Secondary | ICD-10-CM | POA: Diagnosis present

## 2023-10-26 DIAGNOSIS — I110 Hypertensive heart disease with heart failure: Secondary | ICD-10-CM | POA: Diagnosis present

## 2023-10-26 DIAGNOSIS — G4700 Insomnia, unspecified: Secondary | ICD-10-CM | POA: Diagnosis present

## 2023-10-26 LAB — VITAMIN B12: Vitamin B-12: 204 pg/mL (ref 180–914)

## 2023-10-26 LAB — I-STAT VENOUS BLOOD GAS, ED
Acid-Base Excess: 3 mmol/L — ABNORMAL HIGH (ref 0.0–2.0)
Bicarbonate: 26.7 mmol/L (ref 20.0–28.0)
Calcium, Ion: 1.09 mmol/L — ABNORMAL LOW (ref 1.15–1.40)
HCT: 44 % (ref 36.0–46.0)
Hemoglobin: 15 g/dL (ref 12.0–15.0)
O2 Saturation: 90 %
Potassium: 3 mmol/L — ABNORMAL LOW (ref 3.5–5.1)
Sodium: 138 mmol/L (ref 135–145)
TCO2: 28 mmol/L (ref 22–32)
pCO2, Ven: 38.9 mm[Hg] — ABNORMAL LOW (ref 44–60)
pH, Ven: 7.445 — ABNORMAL HIGH (ref 7.25–7.43)
pO2, Ven: 55 mm[Hg] — ABNORMAL HIGH (ref 32–45)

## 2023-10-26 LAB — CBC
HCT: 43 % (ref 36.0–46.0)
Hemoglobin: 15.2 g/dL — ABNORMAL HIGH (ref 12.0–15.0)
MCH: 31.8 pg (ref 26.0–34.0)
MCHC: 35.3 g/dL (ref 30.0–36.0)
MCV: 90 fL (ref 80.0–100.0)
Platelets: 321 10*3/uL (ref 150–400)
RBC: 4.78 MIL/uL (ref 3.87–5.11)
RDW: 13.1 % (ref 11.5–15.5)
WBC: 8.3 10*3/uL (ref 4.0–10.5)
nRBC: 0 % (ref 0.0–0.2)

## 2023-10-26 LAB — ETHANOL: Alcohol, Ethyl (B): <10 mg/dL (ref <10)

## 2023-10-26 LAB — BLOOD GAS, VENOUS
Acid-Base Excess: 2.6 mmol/L — ABNORMAL HIGH (ref 0.0–2.0)
Bicarbonate: 26.3 mmol/L (ref 20.0–28.0)
Drawn by: 59174
O2 Saturation: 89.5 %
Patient temperature: 36.8
pCO2, Ven: 37 mm[Hg] — ABNORMAL LOW (ref 44–60)
pH, Ven: 7.46 — ABNORMAL HIGH (ref 7.25–7.43)
pO2, Ven: 54 mm[Hg] — ABNORMAL HIGH (ref 32–45)

## 2023-10-26 LAB — ECHOCARDIOGRAM COMPLETE
AR max vel: 2.26 cm2
AV Peak grad: 6.5 mm[Hg]
Ao pk vel: 1.27 m/s
Area-P 1/2: 4.31 cm2
Height: 62 in
S' Lateral: 2.7 cm
Weight: 2479.73 [oz_av]

## 2023-10-26 LAB — COMPREHENSIVE METABOLIC PANEL
ALT: 25 U/L (ref 0–44)
AST: 23 U/L (ref 15–41)
Albumin: 3.5 g/dL (ref 3.5–5.0)
Alkaline Phosphatase: 60 U/L (ref 38–126)
Anion gap: 9 (ref 5–15)
BUN: 11 mg/dL (ref 6–20)
CO2: 26 mmol/L (ref 22–32)
Calcium: 9 mg/dL (ref 8.9–10.3)
Chloride: 103 mmol/L (ref 98–111)
Creatinine, Ser: 0.9 mg/dL (ref 0.44–1.00)
GFR, Estimated: >60 mL/min (ref >60)
Glucose, Bld: 88 mg/dL (ref 70–99)
Potassium: 2.9 mmol/L — ABNORMAL LOW (ref 3.5–5.1)
Sodium: 138 mmol/L (ref 135–145)
Total Bilirubin: 0.9 mg/dL (ref 0.0–1.2)
Total Protein: 6 g/dL — ABNORMAL LOW (ref 6.5–8.1)

## 2023-10-26 LAB — PHOSPHORUS: Phosphorus: 3.5 mg/dL (ref 2.5–4.6)

## 2023-10-26 LAB — D-DIMER, QUANTITATIVE: D-Dimer, Quant: <0.27 ug{FEU}/mL (ref 0.00–0.50)

## 2023-10-26 LAB — FOLATE: Folate: 8.3 ng/mL (ref >5.9)

## 2023-10-26 LAB — PREALBUMIN: Prealbumin: 34 mg/dL (ref 18–38)

## 2023-10-26 LAB — HIV ANTIBODY (ROUTINE TESTING W REFLEX): HIV Screen 4th Generation wRfx: NONREACTIVE

## 2023-10-26 LAB — POTASSIUM: Potassium: 2.8 mmol/L — ABNORMAL LOW (ref 3.5–5.1)

## 2023-10-26 LAB — MAGNESIUM: Magnesium: 2 mg/dL (ref 1.7–2.4)

## 2023-10-26 LAB — TSH: TSH: 0.502 u[IU]/mL (ref 0.350–4.500)

## 2023-10-26 MED ORDER — SODIUM CHLORIDE 0.9% FLUSH: 10.0000 mL | INTRAVENOUS | Status: DC | PRN

## 2023-10-26 MED ORDER — FAMOTIDINE 20 MG PO TABS: 20.0000 mg | ORAL_TABLET | Freq: Every day | ORAL | Status: DC

## 2023-10-26 MED ORDER — FENTANYL CITRATE PF 50 MCG/ML IJ SOSY
12.5000 ug | PREFILLED_SYRINGE | INTRAMUSCULAR | Status: DC | PRN
  Administered 2023-10-26 – 2023-10-29 (×27): 50 ug via INTRAVENOUS
  Administered 2023-10-30: 25 ug via INTRAVENOUS
  Administered 2023-10-30 – 2023-10-31 (×6): 50 ug via INTRAVENOUS
  Filled 2023-10-26 (×34): qty 1

## 2023-10-26 MED ORDER — BUSPIRONE HCL 5 MG PO TABS
30.0000 mg | ORAL_TABLET | Freq: Two times a day (BID) | ORAL | Status: DC
  Administered 2023-10-26: 30 mg via ORAL
  Filled 2023-10-26: qty 6

## 2023-10-26 MED ORDER — POTASSIUM CHLORIDE CRYS ER 20 MEQ PO TBCR
40.0000 meq | EXTENDED_RELEASE_TABLET | Freq: Once | ORAL | Status: AC
  Administered 2023-10-26: 40 meq via ORAL
  Filled 2023-10-26: qty 2

## 2023-10-26 MED ORDER — BUSPIRONE HCL 5 MG PO TABS: 15.0000 mg | ORAL_TABLET | Freq: Two times a day (BID) | ORAL | Status: DC

## 2023-10-26 MED ORDER — PANTOPRAZOLE SODIUM 40 MG PO TBEC
40.0000 mg | DELAYED_RELEASE_TABLET | Freq: Every day | ORAL | Status: DC
  Administered 2023-10-27 – 2023-10-31 (×5): 40 mg via ORAL
  Filled 2023-10-26 (×5): qty 1

## 2023-10-26 MED ORDER — TAPENTADOL HCL ER 50 MG PO TB12
50.0000 mg | ORAL_TABLET | Freq: Two times a day (BID) | ORAL | Status: DC
  Administered 2023-10-26 – 2023-10-31 (×11): 50 mg via ORAL
  Filled 2023-10-26 (×11): qty 1

## 2023-10-26 MED ORDER — POTASSIUM CHLORIDE 10 MEQ/100ML IV SOLN
10.0000 meq | INTRAVENOUS | Status: AC
  Administered 2023-10-26 (×2): 10 meq via INTRAVENOUS
  Filled 2023-10-26 (×2): qty 100

## 2023-10-26 MED ORDER — ONDANSETRON HCL 4 MG/2ML IJ SOLN
4.0000 mg | Freq: Four times a day (QID) | INTRAMUSCULAR | Status: DC | PRN
  Administered 2023-10-26 – 2023-10-31 (×6): 4 mg via INTRAVENOUS
  Filled 2023-10-26 (×6): qty 2

## 2023-10-26 MED ORDER — CHOLECALCIFEROL 10 MCG (400 UNIT) PO TABS
400.0000 [IU] | ORAL_TABLET | Freq: Every day | ORAL | Status: DC
  Administered 2023-10-26 – 2023-10-31 (×6): 400 [IU] via ORAL
  Filled 2023-10-26 (×6): qty 1

## 2023-10-26 MED ORDER — DICYCLOMINE HCL 20 MG PO TABS: 40.0000 mg | ORAL_TABLET | Freq: Four times a day (QID) | ORAL | Status: DC | PRN

## 2023-10-26 MED ORDER — AMLODIPINE BESYLATE 5 MG PO TABS
5.0000 mg | ORAL_TABLET | Freq: Every day | ORAL | Status: DC
  Administered 2023-10-26 – 2023-10-31 (×6): 5 mg via ORAL
  Filled 2023-10-26 (×6): qty 1

## 2023-10-26 MED ORDER — LAMOTRIGINE 25 MG PO TABS
25.0000 mg | ORAL_TABLET | Freq: Every day | ORAL | Status: DC
  Administered 2023-10-26 – 2023-10-31 (×6): 25 mg via ORAL
  Filled 2023-10-26 (×6): qty 1

## 2023-10-26 MED ORDER — AMLODIPINE BESYLATE 10 MG PO TABS
10.0000 mg | ORAL_TABLET | Freq: Every day | ORAL | Status: DC
  Filled 2023-10-26: qty 1

## 2023-10-26 MED ORDER — BUSPIRONE HCL 5 MG PO TABS
15.0000 mg | ORAL_TABLET | Freq: Three times a day (TID) | ORAL | Status: DC
  Administered 2023-10-26 – 2023-10-31 (×14): 15 mg via ORAL
  Filled 2023-10-26 (×14): qty 3

## 2023-10-26 MED ORDER — ROSUVASTATIN CALCIUM 20 MG PO TABS
20.0000 mg | ORAL_TABLET | Freq: Every day | ORAL | Status: DC
  Administered 2023-10-26 – 2023-10-30 (×5): 20 mg via ORAL
  Filled 2023-10-26 (×5): qty 1

## 2023-10-26 MED ORDER — METHOCARBAMOL 750 MG PO TABS
750.0000 mg | ORAL_TABLET | Freq: Three times a day (TID) | ORAL | Status: DC | PRN
  Administered 2023-10-26 – 2023-10-30 (×6): 750 mg via ORAL
  Filled 2023-10-26 (×6): qty 1

## 2023-10-26 MED ORDER — ONDANSETRON HCL 4 MG PO TABS
4.0000 mg | ORAL_TABLET | Freq: Four times a day (QID) | ORAL | Status: DC | PRN
  Administered 2023-10-27 – 2023-10-31 (×4): 4 mg via ORAL
  Filled 2023-10-26 (×4): qty 1

## 2023-10-26 MED ORDER — MELATONIN 5 MG PO TABS
10.0000 mg | ORAL_TABLET | Freq: Every day | ORAL | Status: DC
  Administered 2023-10-27 – 2023-10-30 (×5): 10 mg via ORAL
  Filled 2023-10-26 (×5): qty 2

## 2023-10-26 MED ORDER — SODIUM CHLORIDE 0.9 % IV SOLN: INTRAVENOUS | Status: AC

## 2023-10-26 MED ORDER — SODIUM CHLORIDE 0.9% FLUSH
10.0000 mL | Freq: Two times a day (BID) | INTRAVENOUS | Status: DC
  Administered 2023-10-27 – 2023-10-28 (×3): 10 mL
  Administered 2023-10-30: 20 mL

## 2023-10-26 MED ORDER — TAPENTADOL HCL 50 MG PO TABS
50.0000 mg | ORAL_TABLET | Freq: Three times a day (TID) | ORAL | Status: DC | PRN
  Administered 2023-10-27 – 2023-10-31 (×5): 50 mg via ORAL
  Filled 2023-10-26 (×6): qty 1

## 2023-10-26 MED ORDER — GABAPENTIN 300 MG PO CAPS
300.0000 mg | ORAL_CAPSULE | Freq: Every day | ORAL | Status: DC
  Administered 2023-10-26 – 2023-10-30 (×5): 300 mg via ORAL
  Filled 2023-10-26 (×5): qty 1

## 2023-10-26 MED ORDER — CHLORHEXIDINE GLUCONATE CLOTH 2 % EX PADS
6.0000 | MEDICATED_PAD | Freq: Every day | CUTANEOUS | Status: DC
  Administered 2023-10-26 – 2023-10-31 (×6): 6 via TOPICAL

## 2023-10-26 NOTE — Progress Notes (Signed)
Echocardiogram 2D Echocardiogram has been performed.  Lucendia Herrlich 10/26/2023, 4:03 PM

## 2023-10-26 NOTE — Progress Notes (Signed)
 Second bag of K had to be stopped due to burning. MD notified.

## 2023-10-26 NOTE — Care Management Obs Status (Signed)
 MEDICARE OBSERVATION STATUS NOTIFICATION   Patient Details  Name: Bellah Alia MRN: [id number] Date of Birth: 10-11-1965   Medicare Observation Status Notification Given:  Yes    Terre Ferri, RN 10/26/2023, 10:47 AM

## 2023-10-26 NOTE — Consult Note (Addendum)
 [facility] Psychiatric Consult Initial  Patient Name: .[name]  MRN: [id number]  DOB: Nov 23, 1965  Consult Order details:  Dr. Constantino   Mode of Visit: In person    Psychiatry Consult Evaluation  Service Date: October 26, 2023 LOS:  LOS: 0 days  Chief Complaint I can feel myself spiralling  Primary Psychiatric Diagnoses  Treatment resistant depression 2.  Generalized anxiety disorder with panic attacks 3.  Bulimia   Assessment  [name] is a 58 y.o. female admitted: Medically for 10/25/2023 12:48 PM for N+V. She carries the psychiatric diagnoses of depression, anxiety and remote EtOH abuse and has a past medical history of  DJD, endometriosis, HLD, HTN, migraines, pseudoseizure and seizure.   Her current presentation of several months of worsening of low mood, re-emergence of chornic SI, etc is most consistent with known diagnosis of depression.  Feel seasonal component as wellShe meets criteria for GAD with panic attacks and bulimia (early remission) based on clinical interview.  Current outpatient psychotropic medications include amitriptyline  100 mg, buspirone  15 mg BID and historically she has had a fair response to these medications - usually keep her at mild/mod depression in summer with exacerbations every winter. She was compliant with medications prior to admission as evidenced by pt report, fill hx3. On initial examination, patient was quite pleasant and a good historian. She was unwilling to consider medications that would require monitoring (ie low dose lithium, thyroid  hormone) but very open to other modalities. Please see plan below for detailed recommendations.   Diagnoses:  Active Hospital problems: Principal Problem:   Dilation of biliary tract Active Problems:   Hypokalemia   Diarrhea   Seizure-like activity (HCC)   Depression   Dehydration   AKI (acute kidney injury) (HCC)   Tobacco abuse    Plan   ## Psychiatric Medication  Recommendations:  -- currently on buspirone  30 mg BID --> 15 TID (home dose 15 BID) -- also on amitriptyline  100 mg (backed off bc of TD)  -- s vitamin D (last lab low w/ PCP outside [facility] system) -- s lamotrigine  25 mg with plan to uptitrate to ?100-150 mg? As outpt.  -- melatonin   Discussed proper use of sun lamp in AM  ## Medical Decision Making Capacity: Not specifically addressed in this encounter  ## Further Work-up:  -- vit B12, folate (organic contributers to depression)  -- most recent EKG on 2/4 had QtC of 454 -- Pertinent labwork reviewed earlier this admission includes: TSH wnl, pt's cell phone had EMR of PCP - vit D low at 28  From pt's outside records: Vit D low at 28 mg within last year   ## Disposition:-- There are no psychiatric contraindications to discharge at this time Have asked SW to refer to PHP or IOP closer to dc  ## Behavioral / Environmental: - No specific recommendations at this time.     ## Safety and Observation Level:  - Based on my clinical evaluation, I estimate the patient to be at low risk of self harm in the current setting. - At this time, we recommend  routine. This decision is based on my review of the chart including patient's history and current presentation, interview of the patient, mental status examination, and consideration of suicide risk including evaluating suicidal ideation, plan, intent, suicidal or self-harm behaviors, risk factors, and protective factors. This judgment is based on our ability to directly address suicide risk, implement suicide prevention strategies, and develop a safety plan while the patient  is in the clinical setting. Please contact our team if there is a concern that risk level has changed.  CSSR Risk Category:C-SSRS RISK CATEGORY: No Risk  Suicide Risk Assessment: Patient has following modifiable risk factors for suicide: under treated depression  and lack of access to outpatient mental health resources,  which we are addressing by referring to therapy, changing med regimen . Patient has following non-modifiable or demographic risk factors for suicide: history of suicide attempt, history of self harm behavior, and psychiatric hospitalization Patient has the following protective factors against suicide: Cultural, spiritual, or religious beliefs that discourage suicide, devotion to family, last suicide attempt > 5 years ago  Thank you for this consult request. Recommendations have been communicated to the primary team.  We will continue to follow at this time.   [name] [name]       History of Present Illness  Relevant Aspects of [facility] Course:  Admitted on 10/25/2023 for GI distress. They endrosed hx depression.   Patient Report:  Patient endorses long history of struggles with mental illness.  Had several suicide attempts in 2015/2016 culminating in a large overdose on Tylenol  which required a month to recover from medically and ultimately resulted in 6-month hospitalization to [facility].  Since discharge, she has done overall much better and has not had a suicide attempt since that time - fear of going back to [place] as well as suicide attempts in families large motivator. Many personal losses [phone], did better 2020-now. Tends to be mild/mod depressed in summer, severely depressed in winter. Main goal is a life I find worth living which she has every summer. Chronic passive suicidality, no active thoughts/plan. Was hoping to discuss medication changes that might help with mood sooner than the season changing. Not open to discussing anything she might need ongoing labwork for (ie lithium, thyroid , antipsychotics, etc). Open to plan as above discussed r/b/se of lamotrigine  inc rash and need for compliance. No allergy to gabapentin  - was unable to remove this.   Strong seasonal component. Never tried light therapy.   Psych ROS:  Depression: low mood, low motivation (difficulty  getting out of bed most days), usually eating too little w/ binges (on semaglutide), sleep is pretty good - at least 6 hours a night, concentration OK,  chronic suicidal thoughts w/o intent or plan.  Anxiety:  paralyzing fear, social anxiety, has some panic attacks (recently 2-3/week) but background anxiety is main problem.  Mania (lifetime and current): denies Psychosis: (lifetime and current): debues  Collateral information:  None today  ROS - GI distress. Had some burninig in arm when IV ran out before K+  Psychiatric and Social History  Psychiatric History:  Information collected from pt, medical record  Prev Dx/Sx: depression, anxiety. Remote hx bulimia (couple weeks ago) Reports long history of fairly treatmetn resistant depression. Last suicidea ttempt (of 10+) in 2016 which was followed by 6 month hospitalizaiton at [facility]. Dx w/ bipolar in 2010s pt states to get access to addl meds denies hx mania.  Current Psych Provider: Mojeed Akintayo q2-3  Home Meds (current):  buspirone  15 TID Amitriptyline  100 mg at bedtime - apparently had TD on higher dose of this.   Previous Med Trials:  prozac  20 mg Numerous other med trials - basically every antidepressant, most commonly used antipsychotics   On allergy list:  Gabapentin  (abd pain, GI bleed) - pt denies - tried to remove  Therapy: not currently   Prior Psych Hospitalization: numerous. Notably got several sessions  of ECT in [place] system in 2020. Stopped working. Approved for TMS at one point but no-showed.  Prior Self Harm: 10+ lifetime suicide attempts mostly on Tylenol  Prior Violence: no  Family Psych History: aunt bipolar, older sx w/ mood instability, brother who is a monster Family Hx suicide: uncle completed, nephew completed, dad attempted  Social History:  Developmental Hx: deferred Educational Hx: 2 years college major undecided Occupational Hx: disability for depression Legal Hx: deferred Living Situation:  with dog  Spiritual Hx: agnostic Access to weapons/lethal means: no, denies large stockpiles meds  Substance History Alcohol: 2 days a year - priorly significant Type of alcohol: margaritas at [employer] Last Drink deferred Number of drinks per day deferred History of alcohol withdrawal seizuresL I think I probably did History of DT's: pt unsure Tobacco: 4 cigarettes/day declined patch Illicit drugs: denies Prescription drug abuse: denied Rehab hx: no  Exam Findings  Physical Exam:  Vital Signs:  Temp:  [97.8 F (36.6 C)-98.8 F (37.1 C)] 98.2 F (36.8 C) (02/05 0804) Pulse Rate:  [81-107] 87 (02/05 0903) Resp:  [14-27] 16 (02/05 0315) BP: (108-140)/(66-96) 115/66 (02/05 0903) SpO2:  [93 %-100 %] 97 % (02/05 0903) Weight:  [70.3 kg-73 kg] 70.3 kg (02/05 0315) Blood pressure 115/66, pulse 87, temperature 98.2 F (36.8 C), temperature source Oral, resp. rate 16, height 5' 2 (1.575 m), weight 70.3 kg, SpO2 97%. Body mass index is 28.35 kg/m.  Physical Exam HENT:     Head: Normocephalic.  Eyes:     Conjunctiva/sclera: Conjunctivae normal.  Pulmonary:     Effort: Pulmonary effort is normal.  Neurological:     Mental Status: She is alert and oriented to person, place, and time.     Mental Status Exam: General Appearance: Fairly Groomed - hair brushed, earrings in, light makeup.   Orientation:  Full (Time, Place, and Person)  Memory:  Immediate;   Good Recent;   Good Remote;   Good  Concentration:  Concentration: Good  Recall:  Good  Attention  Good  Eye Contact:  Good  Speech:  Clear and Coherent  Language:  Good  Volume:  Normal  Mood: Depressed  Affect:  Congruent - brightened v briefly   Thought Process:  Coherent  Thought Content:  Logical and devoid of delusions/paranoia  Suicidal Thoughts:  No  Homicidal Thoughts:  No  Judgement:  Fair  Insight:  Fair  Psychomotor Activity:  Normal  Akathisia:  No  Fund of Knowledge:  Good   No perceptural  disturbances endorsed/observed  Assets:  Communication Skills Desire for Improvement Financial Resources/Insurance Housing Leisure Time  Cognition:  WNL  ADL's:  not formally assessed  AIMS (if indicated):        Other History   These have been pulled in through the EMR, reviewed, and updated if appropriate.  Family History:  The patient's family history includes Cancer in an other family member; Heart attack in her father; Heart failure in her father; Hyperlipidemia in her half-sister, half-sister, and mother; Hypertension in her half-sister, half-sister, and mother.  Medical History: Past Medical History:  Diagnosis Date   Anorexia    Anxiety    Depression    DJD (degenerative joint disease) of cervical spine    Endometriosis    ETOH abuse    sober 3 1/2 years   Hyperlipidemia    Hypertension    Migraines    PICC (peripherally inserted central catheter) in place    rt neck   Psychiatric pseudoseizure  Seizures (HCC)     Surgical History: Past Surgical History:  Procedure Laterality Date   ABDOMINAL HYSTERECTOMY     ABDOMINAL SURGERY     APPENDECTOMY     CARPAL TUNNEL RELEASE     2010   CHOLECYSTECTOMY     ESOPHAGOGASTRODUODENOSCOPY N/A 10/06/2014   Procedure: ESOPHAGOGASTRODUODENOSCOPY (EGD);  Surgeon: Lamar JONETTA Aho, MD;  Location: [facility] ENDOSCOPY;  Service: Endoscopy;  Laterality: N/A;   KNEE SURGERY     laproscopy     NASAL SINUS SURGERY     PERICARDIOCENTESIS N/A 03/16/2021   Procedure: PERICARDIOCENTESIS;  Surgeon: Elmira Newman PARAS, MD;  Location: [facility] INVASIVE CV LAB;  Service: Cardiovascular;  Laterality: N/A;   SPINAL CORD STIMULATOR REMOVAL N/A 12/18/2021   Procedure: Removal of spinal cord stimulator;  Surgeon: Darlis Deatrice RAMAN, MD;  Location: [facility] OR;  Service: Neurosurgery;  Laterality: N/A;  RM 19   VIDEO ASSISTED THORACOSCOPY Right 03/18/2021   Procedure: VIDEO ASSISTED THORACOSCOPY FOR PERICARDIAL WINDOW;  Surgeon: Shyrl Linnie KIDD, MD;   Location: [facility] OR;  Service: Thoracic;  Laterality: Right;  pericardial window     Medications:   Current Facility-Administered Medications:    0.9 %  sodium chloride  infusion, , Intravenous, Continuous, Doutova, Anastassia, MD, Last Rate: 100 mL/hr at 10/26/23 0316, New Bag at 10/26/23 0316   amLODipine  (NORVASC ) tablet 5 mg, 5 mg, Oral, Daily, Akula, Vijaya, MD, 5 mg at 10/26/23 9096   busPIRone  (BUSPAR ) tablet 30 mg, 30 mg, Oral, BID, Doutova, Anastassia, MD, 30 mg at 10/26/23 0811   fentaNYL  (SUBLIMAZE ) injection 12.5-50 mcg, 12.5-50 mcg, Intravenous, Q2H PRN, Doutova, Anastassia, MD, 50 mcg at 10/26/23 9197   gabapentin  (NEURONTIN ) capsule 300 mg, 300 mg, Oral, QHS, Doutova, Anastassia, MD   methocarbamol  (ROBAXIN ) tablet 750 mg, 750 mg, Oral, Q8H PRN, Akula, Vijaya, MD, 750 mg at 10/26/23 0903   ondansetron  (ZOFRAN ) tablet 4 mg, 4 mg, Oral, Q6H PRN **OR** ondansetron  (ZOFRAN ) injection 4 mg, 4 mg, Intravenous, Q6H PRN, Doutova, Anastassia, MD   potassium chloride  10 mEq in 100 mL IVPB, 10 mEq, Intravenous, Q1 Hr x 4, Akula, Vijaya, MD   rosuvastatin  (CRESTOR ) tablet 20 mg, 20 mg, Oral, QHS, Doutova, Anastassia, MD   tapentadol  (NUCYNTA ) 12 hr tablet 50 mg, 50 mg, Oral, Q12H, Doutova, Anastassia, MD, 50 mg at 10/26/23 0903  Allergies: Allergies  Allergen Reactions   Aspirin  Other (See Comments)    GI bleed.   Coconut (Cocos Nucifera) Rash   Doxycycline Anaphylaxis and Other (See Comments)    Joint damage also   Gabapentin      Other reaction(s): Abdominal Pain, GI Bleed   Imitrex [Sumatriptan Base] Other (See Comments)    Severe hypertension, all triptans   Iodinated Contrast Media Anaphylaxis   Lactose Diarrhea    Other reaction(s): GI Upset (intolerance)   Lidocaine  Anaphylaxis    Pt states she does well with Marcaine /Bupivicaine without problem   Prednisone  Other (See Comments)    She goes crazy   Sulfa Antibiotics Anaphylaxis   Sulfasalazine Anaphylaxis   Sumatriptan  Other (See Comments)    Felt like she was having a stroke, heart rate and blood pressure went through the roof   Levofloxacin Other (See Comments)    Joints hurt   Amoxicillin-Pot Clavulanate Other (See Comments)    Pt can't remember what type of reaction she had, but states her pharmacy has it listed in their allergy list.  12/18/21 Pt has tolerated Ancef  in the past.   Caffeine -Sodium Benzoate  Other reaction(s): Dizziness (intolerance)   Other Other (See Comments)    Artificial sugar - migraine   Prochlorperazine  Other (See Comments)    Tonic/clonic seizure   Latex Rash   Levofloxacin Rash and Other (See Comments)    Joints hurt   Metrizamide Other (See Comments)   Nsaids Rash and Other (See Comments)    GI bleed   Tolmetin Rash    Rollene A Carrell Rahmani

## 2023-10-26 NOTE — Plan of Care (Signed)
 Stool sample still pending.   Problem: Health Behavior/Discharge Planning: Goal: Ability to manage health-related needs will improve Outcome: Progressing   Problem: Clinical Measurements: Goal: Ability to maintain clinical measurements within normal limits will improve Outcome: Progressing Goal: Will remain free from infection Outcome: Progressing Goal: Diagnostic test results will improve Outcome: Progressing Goal: Respiratory complications will improve Outcome: Progressing Goal: Cardiovascular complication will be avoided Outcome: Progressing   Problem: Education: Goal: Knowledge of General Education information will improve Description: Including pain rating scale, medication(s)/side effects and non-pharmacologic comfort measures Outcome: Progressing

## 2023-10-26 NOTE — ED Notes (Signed)
 Floor notified of patient transport to unit.

## 2023-10-26 NOTE — TOC Initial Note (Signed)
 Transition of Care (TOC) - Initial/Assessment Note   Spoke to patient at bedside. Patient from home alone. Has sister. Patient was not using any medical equipment prior to admission.     Transition of Care Department [facility])  will continue to monitor patient advancement through interdisciplinary progression rounds. If new patient transition needs arise, please place a TOC consult.   Patient Details  Name: [name] MRN: [id number] Date of Birth: 1966-05-02  Transition of Care [facility]) CM/SW Contact:    Stephane Powell Jansky, RN Phone Number: 10/26/2023, 10:50 AM  Clinical Narrative:                   Expected Discharge Plan: Home/Self Care Barriers to Discharge: Continued Medical Work up   Patient Goals and CMS Choice Patient states their goals for this hospitalization and ongoing recovery are:: to return to home          Expected Discharge Plan and Services In-house Referral: NA Discharge Planning Services: CM Consult Post Acute Care Choice: NA Living arrangements for the past 2 months: Apartment                 DME Arranged: N/A DME Agency: NA       HH Arranged: NA HH Agency: NA        Prior Living Arrangements/Services Living arrangements for the past 2 months: Apartment Lives with:: Self Patient language and need for interpreter reviewed:: Yes Do you feel safe going back to the place where you live?: Yes      Need for Family Participation in Patient Care: Yes (Comment) Care giver support system in place?: Yes (comment)   Criminal Activity/Legal Involvement Pertinent to Current Situation/Hospitalization: No - Comment as needed  Activities of Daily Living   ADL Screening (condition at time of admission) Independently performs ADLs?: Yes (appropriate for developmental age) Is the patient deaf or have difficulty hearing?: No Does the patient have difficulty seeing, even when wearing glasses/contacts?: No Does the patient have difficulty concentrating,  remembering, or making decisions?: No  Permission Sought/Granted   Permission granted to share information with : No              Emotional Assessment Appearance:: Appears stated age Attitude/Demeanor/Rapport: Engaged Affect (typically observed): Accepting Orientation: : Oriented to Self, Oriented to Place, Oriented to  Time, Oriented to Situation Alcohol / Substance Use: Not Applicable Psych Involvement: No (comment)  Admission diagnosis:  Atypical chest pain [R07.89] Dilation of biliary tract [K83.8] Patient Active Problem List   Diagnosis Date Noted   Dilation of biliary tract 10/25/2023   Dehydration 10/25/2023   AKI (acute kidney injury) (HCC) 10/25/2023   Tobacco abuse 10/25/2023   Pericardial effusion 03/15/2021   Cardiac tamponade 03/14/2021   Cardiomegaly 03/09/2021   Dyspnea on exertion 01/30/2019   Abdominal pain    Suicide attempt (HCC)    Amitriptyline  overdose    Respiratory acidosis    Prolonged Q-T interval on ECG    Alcohol abuse    Depression    Anxiety state    Bipolar I disorder (HCC)    Respiratory failure (HCC)    Seizure-like activity (HCC) 12/11/2014   Suicide attempt by hanging [facility])    MDD (major depressive disorder), recurrent severe, without psychosis (HCC) 11/29/2014   Panic disorder 11/29/2014   Agoraphobia 11/29/2014   ARF (acute renal failure) (HCC) 11/25/2014   Intentional acetaminophen  overdose (HCC) 10/26/2014   Major depressive disorder, recurrent, severe without psychotic features (HCC)    Insomnia  Chronic pain syndrome    Bipolar 1 disorder, mixed, severe (HCC) 10/09/2014   Acute blood loss anemia 10/07/2014   Liver failure, acute    GI bleed 10/06/2014   Duodenal ulcer hemorrhage 10/06/2014   Vomiting 10/05/2014   Acute hepatitis 10/05/2014   Acute renal insufficiency 10/05/2014   Seizure disorder (HCC) 10/05/2014   Hyperammonemia (HCC) 10/05/2014   Suicidal ideation 10/05/2014   Bipolar disorder (HCC) 10/01/2014    Major depression 10/01/2014   Nausea and vomiting 09/30/2014   Diarrhea 09/30/2014   SIRS (systemic inflammatory response syndrome) (HCC) 07/15/2014   Tylenol  overdose 07/13/2014   Hypokalemia 07/13/2014   PCP:  Rosalea Rosina SAILOR, PA Pharmacy:   [facility] [address] [place] [address] Phone: [phone] Fax: [phone]  [facility] [id number] [place], [place] - [address] [place] [address] Phone: [phone] Fax: [phone]  [id number] [place], [place] - [address] AT [facility] OF [address] [place] [address] Phone: [phone] Fax: [phone]     Social Drivers of Health (SDOH) Social History: SDOH Screenings   Food Insecurity: Patient Declined (10/26/2023)  Housing: Patient Declined (10/26/2023)  Transportation Needs: Patient Declined (10/26/2023)  Utilities: Patient Declined (10/26/2023)  Tobacco Use: High Risk (10/25/2023)   SDOH Interventions:     Readmission Risk Interventions     No data to display
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
## 2023-10-26 NOTE — Discharge Instructions (Signed)
 ? ?  ? ?  Outpatient Psychiatry and Counseling ? ?Therapeutic Alternatives: Mobile Crisis Management:  [phone] ? ?[facility] ([facility], adult education)         ?[address] ?[place], [place] [address] ?[phone] ? ?[facility] [employer] sliding scale fee and walk in schedule: M-F 8am-12pm/1pm-3pm ?[address] ?[place], [place] [address] ?[phone] ? ?[facility]/ Intensive Outpatient Therapy Program ?[address] ?[place], [place] [address] ?[phone] ? ?[facility]   [facility] ?[address]   [address] ?[place], [place] [address]    [place], [place] [address] ?[phone]     [phone] ? ?[facility] ?[address] [facility] [address]  [address] ?[place] [address]    [place] [address] ?[phone]     [phone] ? ?Andee Poles, MD    [facility] ?[address]    [address] ?[place] [address]    [place] [address] ?[phone]       [phone] ? ?[facility]   [facility] ?[address]   [address] ?[place], [place] ?[phone]     [phone] ? ?[facility]    [employer] ?[address]    Eulogio Ditch, MD ?[place], [place]                  [address] ?[phone]     [place], [place] [address] ?[phone] ?[facility]: (Spanish speakin) ?[phone] ? ?[facility]    [facility] ?[address]    [address] ?[place], [place] [address]    [place], [place] [address] ?[phone]     [phone]  ?

## 2023-10-26 NOTE — Progress Notes (Signed)
 Mobility Specialist Progress Note:   10/26/23 1009  Mobility  Activity Ambulated independently in hallway  Level of Assistance Independent after set-up  Assistive Device None  Distance Ambulated (ft) 550 ft  Activity Response Tolerated well  Mobility Referral Yes  Mobility visit 1 Mobility  Mobility Specialist Start Time (ACUTE ONLY) 0945  Mobility Specialist Stop Time (ACUTE ONLY) 1000  Mobility Specialist Time Calculation (min) (ACUTE ONLY) 15 min   During Mobility: 99 HR , 90%-95% SpO2 RA  Pt received in bed, agreeable to mobility. C/o general weakness d/t not eating, otherwise asx throughout. VSS on RA. Pt returned to bed with call bell in reach and all needs met.   Brown Husband  Mobility Specialist Please contact via [employer] office at [phone]
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
## 2023-10-26 NOTE — Progress Notes (Signed)
 PT Cancellation Note  Patient Details Name: [name] MRN: [id number] DOB: 1966-08-25   Cancelled Treatment:    Reason Eval/Treat Not Completed: PT screened, no needs identified, will sign off Pt ambulated unit independently with mobility specialist. Pt endorses feeling weaker than baseline in setting of symptoms, but denies issues with balance or mobility.   Aleck Daring, PT, DPT Acute Rehabilitation Services Office [phone]    Alayne ONEIDA Daring 10/26/2023, 12:33 PM

## 2023-10-26 NOTE — Progress Notes (Signed)
 Nurse requested Mobility Specialist to perform oxygen saturation test with pt which includes removing pt from oxygen both at rest and while ambulating.  Below are the results from that testing.     Patient Saturations on Room Air at Rest = spO2 93%  Patient Saturations on Room Air while Ambulating = sp02 90% .  Rested and performed pursed lip breathing for 1 minute with sp02 at 95%.  At end of testing pt left in room on RA    Reported results to nurse.

## 2023-10-26 NOTE — Plan of Care (Signed)

## 2023-10-26 NOTE — Progress Notes (Signed)
 [facility]                                                                               [name], is a 58 y.o. female, DOB - [birth year], FMW:[id number] Admit date - 10/25/2023    Outpatient Primary MD for the patient is Rosalea Rosina SAILOR, PA  LOS - 0  days    Brief summary    [name] is a 58 y.o. female with medical history significant of   migraine,depression, HLD, PUD, IBS, hypertension, ongoing tobacco use, pseudoseizures, admitted for nausea, vomiting , diarrhea and abdominal pain.  Assessment & Plan    Assessment and Plan:   Nausea, vomiting, diarrhea and abdominal pain in the setting of IBS As per the patient, she has chronic diarrhea from IBS.  She also reports her diarrhea worsens with seasonal worsening of depression.  No BM since this morning. Nausea has improved with anti emetics.  Abdominal pain is persistent.  MRCP done, showing dilated intra and extrahepatic biliary ducts.  GI consulted for recommendations.  Starting her on clears and slowly advance as tolerated.  Get GI panel to rule out infectious causes.    H/o PUD, duodenal ulcer in the past Continue with PPI.    IBS Recommend outpatient follow up with GI.    Hypokalemia:  Replaced   Hypertension Well controlled BP parameters.    Hyperlipidemia Resume crestor .    Anxiety and depression.  Psychiatry consulted.  No suicidal ideations. Get vit b12 and folic acid  levels.    Pseudoseizures:  Continue with gabapentin .   Estimated body mass index is 28.35 kg/m as calculated from the following:   Height as of this encounter: 5' 2 (1.575 m).   Weight as of this encounter: 70.3 kg.  Code Status: full code.  DVT Prophylaxis:  SCDs Start: 10/26/23 0307   Level of Care: Level of care: Telemetry Medical Family Communication: none at bedside.   Disposition Plan:     Remains inpatient appropriate:  ongoing abd pain.   Procedures:  MRCP  Consultants:    Psychiatry gastroenterology  Antimicrobials:   Anti-infectives (From admission, onward)    None        Medications  Scheduled Meds:  amLODipine   5 mg Oral Daily   busPIRone   15 mg Oral TID   cholecalciferol   400 Units Oral Daily   famotidine   20 mg Oral Daily   gabapentin   300 mg Oral QHS   lamoTRIgine   25 mg Oral Daily   rosuvastatin   20 mg Oral QHS   tapentadol   50 mg Oral Q12H   Continuous Infusions:  potassium chloride  10 mEq (10/26/23 1222)   PRN Meds:.fentaNYL  (SUBLIMAZE ) injection, methocarbamol , ondansetron  **OR** ondansetron  (ZOFRAN ) IV    Subjective:   [name] was seen and examined today. No chest pain or sob. Some abdomina pain.    Objective:   Vitals:   10/26/23 0315 10/26/23 0804 10/26/23 0903 10/26/23 1200  BP: 137/74  115/66 135/76  Pulse: 99  87 89  Resp: 16     Temp: 98.8 F (37.1 C) 98.2 F (36.8 C)    TempSrc: Oral Oral    SpO2: 94%  97% 96%  Weight: 70.3 kg     Height: 5' 2 (1.575 m)       Intake/Output Summary (Last 24 hours) at 10/26/2023 1350 Last data filed at 10/26/2023 0517 Gross per 24 hour  Intake 201.7 ml  Output --  Net 201.7 ml   Filed Weights   10/25/23 1254 10/26/23 0315  Weight: 73 kg 70.3 kg     Exam General exam: Appears calm and comfortable  Respiratory system: Clear to auscultation. Respiratory effort normal. Cardiovascular system: S1 & S2 heard, RRR. No JVD, Gastrointestinal system: Abdomen is nondistended, soft and nontender.  Central nervous system: Alert and oriented. Grossly non focal Extremities: no pedal edema. Skin: No rashes,  Psychiatry: Mood & affect appropriate.    Data Reviewed:  I have personally reviewed following labs and imaging studies   CBC Lab Results  Component Value Date   WBC 8.3 10/26/2023   RBC 4.78 10/26/2023   HGB 15.2 (H) 10/26/2023   HCT 43.0 10/26/2023   MCV 90.0 10/26/2023   MCH 31.8 10/26/2023   PLT 321 10/26/2023   MCHC 35.3 10/26/2023   RDW 13.1  10/26/2023   LYMPHSABS 4.3 (H) 01/30/2019   MONOABS 0.6 01/30/2019   EOSABS 0.1 01/30/2019   BASOSABS 0.1 01/30/2019     Last metabolic panel Lab Results  Component Value Date   NA 138 10/26/2023   K 2.9 (L) 10/26/2023   CL 103 10/26/2023   CO2 26 10/26/2023   BUN 11 10/26/2023   CREATININE 0.90 10/26/2023   GLUCOSE 88 10/26/2023   GFRNONAA >60 10/26/2023   GFRAA >60 02/05/2019   CALCIUM  9.0 10/26/2023   PHOS 3.5 10/26/2023   PROT 6.0 (L) 10/26/2023   ALBUMIN  3.5 10/26/2023   BILITOT 0.9 10/26/2023   ALKPHOS 60 10/26/2023   AST 23 10/26/2023   ALT 25 10/26/2023   ANIONGAP 9 10/26/2023    CBG (last 3)  No results for input(s): GLUCAP in the last 72 hours.    Coagulation Profile: Recent Labs  Lab 10/25/23 1404  INR 0.9     Radiology Studies: MR ABDOMEN MRCP W WO CONTAST Result Date: 10/26/2023 CLINICAL DATA:  Right upper quadrant abdominal pain. Biliary disease suspected. EXAM: MRI ABDOMEN WITHOUT AND WITH CONTRAST (INCLUDING MRCP) TECHNIQUE: Multiplanar multisequence MR imaging of the abdomen was performed both before and after the administration of intravenous contrast. Heavily T2-weighted images of the biliary and pancreatic ducts were obtained, and three-dimensional MRCP images were rendered by post processing. CONTRAST:  7mL GADAVIST  GADOBUTROL  1 MMOL/ML IV SOLN COMPARISON:  CT scan from earlier same day. FINDINGS: Lower chest: Lower chest unremarkable. Hepatobiliary: Heterogeneous perfusion of liver parenchyma on early postcontrast imaging without a discrete mass lesion evident. Loss of signal intensity in the liver parenchyma on out of phase T1 imaging is compatible with steatosis. As noted on CT, there is marked intrahepatic biliary duct dilatation with dilatation of the extrahepatic biliary tree. Common duct measures on the order of 16 mm diameter. Common bile duct in the head of the pancreas measures 13 mm diameter with common bile duct dilatation extending into  the ampulla. MRCP imaging shows no evidence for choledocholithiasis. No obstructing lesion is discernible at the ampulla. No wall thickening or hyperenhancement associated with the extrahepatic biliary system. Pancreas: As noted on CT imaging, there is mild distention of the main pancreatic duct through the head of the pancreas. No evidence for mass lesion in the pancreas no evidence for restricted diffusion in the head of  the pancreas. Spleen:  No splenomegaly. No suspicious focal mass lesion. Adrenals/Urinary Tract: Right adrenal gland unremarkable. 17 mm left adrenal nodule shows loss of signal intensity on out of phase T1 imaging consistent with adenoma. No hydronephrosis or enhancing renal mass lesion. Stomach/Bowel: Stomach is unremarkable. No gastric wall thickening. No evidence of outlet obstruction. Duodenum is normally positioned as is the ligament of Treitz. No small bowel or colonic dilatation within the visualized abdomen. Vascular/Lymphatic: No abdominal aortic aneurysm. No abdominal lymphadenopathy Other:  No intraperitoneal free fluid. Musculoskeletal: No focal suspicious marrow enhancement within the visualized bony anatomy. 3 mm focus of T2 hyperintensity with hyperenhancement is identified in the left psoas muscle (see postcontrast image 71 of series 1502), nonspecific but most likely benign. IMPRESSION: 1. Marked intrahepatic and extrahepatic biliary duct dilatation with dilatation of the main pancreatic duct through the head of the pancreas. No evidence for choledocholithiasis or obstructing lesion at the ampulla. No wall thickening or hyperenhancement associated with the extrahepatic biliary system. ERCP may prove helpful to further evaluate. 2. 3-4 mm focus of hyperenhancement identified in the left psoas muscle, potentially a vascular malformation or nerve sheath tumor, but likely benign. Follow-up MRI in 3-6 months could be used to ensure stability as clinically warranted. 3. Hepatic  steatosis. 4. 17 mm left adrenal adenoma. Electronically Signed   By: Camellia Candle M.D.   On: 10/26/2023 08:37   CT ABDOMEN PELVIS WO CONTRAST Result Date: 10/25/2023 CLINICAL DATA:  Acute abdominal pain EXAM: CT ABDOMEN AND PELVIS WITHOUT CONTRAST TECHNIQUE: Multidetector CT imaging of the abdomen and pelvis was performed following the standard protocol without IV contrast. RADIATION DOSE REDUCTION: This exam was performed according to the departmental dose-optimization program which includes automated exposure control, adjustment of the mA and/or kV according to patient size and/or use of iterative reconstruction technique. COMPARISON:  02/03/2015 FINDINGS: Lower chest: Unremarkable Hepatobiliary: Cholecystectomy. Common hepatic duct 1.5 cm in diameter on image 22 series 3, formerly about 0.8 cm. Common bile duct 1.1 cm in diameter on image 30 series 3, formerly 0.5 cm. There is potentially some mild intrahepatic biliary dilatation as well. The biliary dilatation extends all the way to the ampullary region. Pancreas: Mild dorsal pancreatic duct dilatation distally, up to 4 mm in diameter on image 54 series 6. Spleen: Unremarkable Adrenals/Urinary Tract: 1.5 by 1.8 cm mass of the medial limb of the left adrenal gland with internal density -1 Hounsfield units, compatible with adrenal adenoma. No further imaging workup of this lesion is indicated. Otherwise unremarkable. Stomach/Bowel: Unremarkable Vascular/Lymphatic: Atherosclerosis is present, including aortoiliac atherosclerotic disease. Reproductive: Uterus absent. Other: No supplemental non-categorized findings. Musculoskeletal: Disc bulges at L3-4 and L4-5. IMPRESSION: 1. Biliary dilatation extending all the way to the ampullary region, with mild distal pancreatic duct dilatation. Although some of this may be a physiologic response to prior cholecystectomy, this biliary prominence was not present on 02/03/2015 despite that also being a post cholecystectomy  examination. Possibility of obstruction in the vicinity of the ampulla or distal pancreatic head is not excluded. Consider MRI abdomen with and without contrast with special attention to the pancreas ampulla, to include MRCP for further characterization. 2. Benign left adrenal adenoma. No further imaging workup of this lesion is indicated. 3. Disc bulges at L3-4 and L4-5. 4. Aortic atherosclerosis. Electronically Signed   By: Ryan Salvage M.D.   On: 10/25/2023 18:38   DG Chest 2 View Result Date: 10/25/2023 CLINICAL DATA:  Chest pain and pressure. EXAM: CHEST - 2 VIEW COMPARISON:  Chest radiograph dated 06/30/2021. FINDINGS: Right-sided Port-A-Cath with tip at the cavoatrial junction. No focal consolidation, pleural effusion, or pneumothorax. The cardiac silhouette is within normal limits. No acute osseous pathology. IMPRESSION: No active cardiopulmonary disease. Electronically Signed   By: Vanetta Chou M.D.   On: 10/25/2023 15:16       Elgie Butter M.D. [facility] 10/26/2023, 1:50 PM  Available via Epic secure chat 7am-7pm After 7 pm, please refer to night coverage provider listed on amion.

## 2023-10-27 DIAGNOSIS — F331 Major depressive disorder, recurrent, moderate: Secondary | ICD-10-CM | POA: Diagnosis not present

## 2023-10-27 DIAGNOSIS — K838 Other specified diseases of biliary tract: Secondary | ICD-10-CM | POA: Diagnosis not present

## 2023-10-27 DIAGNOSIS — N179 Acute kidney failure, unspecified: Secondary | ICD-10-CM | POA: Diagnosis not present

## 2023-10-27 DIAGNOSIS — F410 Panic disorder [episodic paroxysmal anxiety] without agoraphobia: Secondary | ICD-10-CM

## 2023-10-27 DIAGNOSIS — F411 Generalized anxiety disorder: Secondary | ICD-10-CM

## 2023-10-27 DIAGNOSIS — R569 Unspecified convulsions: Secondary | ICD-10-CM | POA: Diagnosis not present

## 2023-10-27 DIAGNOSIS — E876 Hypokalemia: Secondary | ICD-10-CM | POA: Diagnosis not present

## 2023-10-27 DIAGNOSIS — R1013 Epigastric pain: Secondary | ICD-10-CM | POA: Diagnosis not present

## 2023-10-27 DIAGNOSIS — K580 Irritable bowel syndrome with diarrhea: Secondary | ICD-10-CM | POA: Diagnosis not present

## 2023-10-27 DIAGNOSIS — F502 Bulimia nervosa, unspecified: Secondary | ICD-10-CM | POA: Diagnosis not present

## 2023-10-27 DIAGNOSIS — R112 Nausea with vomiting, unspecified: Secondary | ICD-10-CM | POA: Diagnosis not present

## 2023-10-27 LAB — COMPREHENSIVE METABOLIC PANEL
ALT: 21 U/L (ref 0–44)
AST: 20 U/L (ref 15–41)
Albumin: 3.5 g/dL (ref 3.5–5.0)
Alkaline Phosphatase: 60 U/L (ref 38–126)
Anion gap: 10 (ref 5–15)
BUN: 5 mg/dL — ABNORMAL LOW (ref 6–20)
CO2: 28 mmol/L (ref 22–32)
Calcium: 9 mg/dL (ref 8.9–10.3)
Chloride: 100 mmol/L (ref 98–111)
Creatinine, Ser: 0.68 mg/dL (ref 0.44–1.00)
GFR, Estimated: >60 mL/min (ref >60)
Glucose, Bld: 85 mg/dL (ref 70–99)
Potassium: 3.3 mmol/L — ABNORMAL LOW (ref 3.5–5.1)
Sodium: 138 mmol/L (ref 135–145)
Total Bilirubin: 0.7 mg/dL (ref 0.0–1.2)
Total Protein: 6.1 g/dL — ABNORMAL LOW (ref 6.5–8.1)

## 2023-10-27 LAB — CBC WITH DIFFERENTIAL/PLATELET
Abs Immature Granulocytes: 0.02 10*3/uL (ref 0.00–0.07)
Basophils Absolute: 0 10*3/uL (ref 0.0–0.1)
Basophils Relative: 1 %
Eosinophils Absolute: 0.2 10*3/uL (ref 0.0–0.5)
Eosinophils Relative: 2 %
HCT: 40.9 % (ref 36.0–46.0)
Hemoglobin: 14.5 g/dL (ref 12.0–15.0)
Immature Granulocytes: 0 %
Lymphocytes Relative: 38 %
Lymphs Abs: 3.1 10*3/uL (ref 0.7–4.0)
MCH: 32.1 pg (ref 26.0–34.0)
MCHC: 35.5 g/dL (ref 30.0–36.0)
MCV: 90.5 fL (ref 80.0–100.0)
Monocytes Absolute: 0.7 10*3/uL (ref 0.1–1.0)
Monocytes Relative: 8 %
Neutro Abs: 4.2 10*3/uL (ref 1.7–7.7)
Neutrophils Relative %: 51 %
Platelets: 316 10*3/uL (ref 150–400)
RBC: 4.52 MIL/uL (ref 3.87–5.11)
RDW: 13 % (ref 11.5–15.5)
WBC: 8.2 10*3/uL (ref 4.0–10.5)
nRBC: 0 % (ref 0.0–0.2)

## 2023-10-27 LAB — GASTROINTESTINAL PANEL BY PCR, STOOL (REPLACES STOOL CULTURE)

## 2023-10-27 LAB — C DIFFICILE QUICK SCREEN W PCR REFLEX
C Diff antigen: NEGATIVE
C Diff interpretation: NOT DETECTED
C Diff toxin: NEGATIVE

## 2023-10-27 MED ORDER — CALCIUM CARBONATE ANTACID 500 MG PO CHEW
1.0000 | CHEWABLE_TABLET | Freq: Once | ORAL | Status: AC
  Administered 2023-10-27: 200 mg via ORAL
  Filled 2023-10-27: qty 1

## 2023-10-27 MED ORDER — POTASSIUM CHLORIDE CRYS ER 20 MEQ PO TBCR
40.0000 meq | EXTENDED_RELEASE_TABLET | Freq: Once | ORAL | Status: AC
  Administered 2023-10-27: 40 meq via ORAL
  Filled 2023-10-27: qty 2

## 2023-10-27 NOTE — Progress Notes (Signed)
   10/27/23 0000  Provider Notification  Date Provider Notified 10/27/23  Time Provider Notified 0000  Method of Notification Page  Notification Reason Other (Comment) (Patient  will like to have more pain medication pain scale of 9/10- 10/10 and something for diarrhea.)  Provider response Other (Comment) (waiting for GI panel to come before adding antidiarrheal for IBS)

## 2023-10-27 NOTE — Progress Notes (Signed)
 [facility]                                                                               [name], is a 58 y.o. female, DOB - 12-Apr-1966, FMW:[id number] Admit date - 10/25/2023    Outpatient Primary MD for the patient is Hailey Rosina SAILOR, PA  LOS - 1  days    Brief summary    [name] is a 58 y.o. female with medical history significant of   migraine,depression, HLD, PUD, IBS, hypertension, ongoing tobacco use, pseudoseizures, admitted for nausea, vomiting , diarrhea and abdominal pain.  Assessment & Plan    Assessment and Plan:   Nausea, vomiting, diarrhea and abdominal pain in the setting of IBS As per the patient, she has chronic diarrhea from IBS.  She also reports her diarrhea worsens with seasonal worsening of depression.  Continues to have diarrhea, one episode of vomiting and some nausea.  MRCP done, showing dilated intra and extrahepatic biliary ducts.  GI consulted , suggested the dilated intra and extrahepatic ducts probably a post cholecystectomy state and chronic narcotic use.  Starting her on clears and slowly advance as tolerated.  Get GI panel to rule out infectious causes. Pending.    H/o PUD, duodenal ulcer in the past Continue with PPI.    IBS Recommend outpatient follow up with GI.    Hypokalemia:  Replaced , recheck levels in am.    Hypertension Optimal BP parameters.  Continue with norvasc  5 mg daily.    Hyperlipidemia Resume crestor .    Anxiety and depression.  Psychiatry consulted.  No suicidal ideations.  Vit b12 204 , low normal.  Folate wnl.  Continue with Buspar  15 mg TID, LAMICTAL   25 mg daily,    Pseudoseizures:  Continue with gabapentin  300 mg daily at bedtime.     Estimated body mass index is 28.35 kg/m as calculated from the following:   Height as of this encounter: 5' 2 (1.575 m).   Weight as of this encounter: 70.3 kg.  Code Status: full code.  DVT Prophylaxis:  SCDs Start: 10/26/23  0307   Level of Care: Level of care: Med-Surg Family Communication: none at bedside.   Disposition Plan:     Remains inpatient appropriate:  ongoing diarrhea.   Procedures:  MRCP  Consultants:   Psychiatry gastroenterology  Antimicrobials:   Anti-infectives (From admission, onward)    None        Medications  Scheduled Meds:  amLODipine   5 mg Oral Daily   busPIRone   15 mg Oral TID   Chlorhexidine  Gluconate Cloth  6 each Topical Daily   cholecalciferol   400 Units Oral Daily   gabapentin   300 mg Oral QHS   lamoTRIgine   25 mg Oral Daily   melatonin  10 mg Oral QHS   pantoprazole   40 mg Oral Q0600   rosuvastatin   20 mg Oral QHS   sodium chloride  flush  10-40 mL Intracatheter Q12H   tapentadol   50 mg Oral Q12H   Continuous Infusions:   PRN Meds:.dicyclomine , fentaNYL  (SUBLIMAZE ) injection, methocarbamol , ondansetron  **OR** ondansetron  (ZOFRAN ) IV, sodium chloride  flush, tapentadol     Subjective:   [name] was  seen and examined today. Reports feeling hollow.  No chest pain , still having diarrhea, some improvement in nausea and vomiting.   Objective:   Vitals:   10/26/23 1200 10/26/23 2022 10/27/23 0532 10/27/23 0900  BP: 135/76 132/83 129/72 127/80  Pulse: 89 100 73 90  Resp:  20  19  Temp:  99.2 F (37.3 C) 98 F (36.7 C) 97.9 F (36.6 C)  TempSrc:  Oral Oral Oral  SpO2: 96% 96% 96% 100%  Weight:      Height:        Intake/Output Summary (Last 24 hours) at 10/27/2023 1124 Last data filed at 10/27/2023 0644 Gross per 24 hour  Intake 960 ml  Output --  Net 960 ml   Filed Weights   10/25/23 1254 10/26/23 0315  Weight: 73 kg 70.3 kg     Exam General exam: Appears calm and comfortable  Respiratory system: Clear to auscultation. Respiratory effort normal. Cardiovascular system: S1 & S2 heard, RRR.  Gastrointestinal system: Abdomen is nondistended, soft and nontender.  Central nervous system: Alert and oriented. No focal neurological  deficits. Extremities: Symmetric 5 x 5 power. Skin: No rashes, Psychiatry: flat affect     Data Reviewed:  I have personally reviewed following labs and imaging studies   CBC Lab Results  Component Value Date   WBC 8.2 10/27/2023   RBC 4.52 10/27/2023   HGB 14.5 10/27/2023   HCT 40.9 10/27/2023   MCV 90.5 10/27/2023   MCH 32.1 10/27/2023   PLT 316 10/27/2023   MCHC 35.5 10/27/2023   RDW 13.0 10/27/2023   LYMPHSABS 3.1 10/27/2023   MONOABS 0.7 10/27/2023   EOSABS 0.2 10/27/2023   BASOSABS 0.0 10/27/2023     Last metabolic panel Lab Results  Component Value Date   NA 138 10/27/2023   K 3.3 (L) 10/27/2023   CL 100 10/27/2023   CO2 28 10/27/2023   BUN 5 (L) 10/27/2023   CREATININE 0.68 10/27/2023   GLUCOSE 85 10/27/2023   GFRNONAA >60 10/27/2023   GFRAA >60 02/05/2019   CALCIUM  9.0 10/27/2023   PHOS 3.5 10/26/2023   PROT 6.1 (L) 10/27/2023   ALBUMIN  3.5 10/27/2023   BILITOT 0.7 10/27/2023   ALKPHOS 60 10/27/2023   AST 20 10/27/2023   ALT 21 10/27/2023   ANIONGAP 10 10/27/2023    CBG (last 3)  No results for input(s): GLUCAP in the last 72 hours.    Coagulation Profile: Recent Labs  Lab 10/25/23 1404  INR 0.9     Radiology Studies: ECHOCARDIOGRAM COMPLETE Result Date: 10/26/2023    ECHOCARDIOGRAM REPORT   Patient Name:   [name] Date of Exam: 10/26/2023 Medical Rec #:  [id number]  Height:       62.0 in Accession #:    [id number] Weight:       155.0 lb Date of Birth:  August 12, 1966 BSA:          1.715 m Patient Age:    57 years   BP:           135/76 mmHg Patient Gender: F          HR:           70 bpm. Exam Location:  Inpatient Procedure: 2D Echo, Cardiac Doppler and Color Doppler Indications:     Chest Pain R07.9  History:         Patient has prior history of Echocardiogram examinations, most  recent 07/20/2023. Cardiomegaly, Signs/Symptoms:Dyspnea; Risk                  Factors:Current Smoker.  Sonographer:     Thea Norlander RCS  Referring Phys:  VERGIE ALES DOUTOVA Diagnosing Phys: Vina Gull MD IMPRESSIONS  1. Left ventricular ejection fraction, by estimation, is 60 to 65%. The left ventricle has normal function. The left ventricle has no regional wall motion abnormalities. Left ventricular diastolic parameters were normal.  2. Right ventricular systolic function is normal. The right ventricular size is normal.  3. The mitral valve is normal in structure. Trivial mitral valve regurgitation.  4. The aortic valve is tricuspid. Aortic valve regurgitation is not visualized. Comparison(s): The left ventricular function is unchanged. FINDINGS  Left Ventricle: Left ventricular ejection fraction, by estimation, is 60 to 65%. The left ventricle has normal function. The left ventricle has no regional wall motion abnormalities. The left ventricular internal cavity size was normal in size. There is  no left ventricular hypertrophy. Left ventricular diastolic parameters were normal. Right Ventricle: The right ventricular size is normal. Right vetricular wall thickness was not assessed. Right ventricular systolic function is normal. Left Atrium: Left atrial size was normal in size. Right Atrium: Right atrial size was normal in size. Pericardium: There is no evidence of pericardial effusion. Mitral Valve: The mitral valve is normal in structure. Trivial mitral valve regurgitation. Tricuspid Valve: The tricuspid valve is normal in structure. Tricuspid valve regurgitation is trivial. Aortic Valve: The aortic valve is tricuspid. Aortic valve regurgitation is not visualized. Aortic valve peak gradient measures 6.5 mmHg. Pulmonic Valve: The pulmonic valve was normal in structure. Pulmonic valve regurgitation is not visualized. Aorta: The aortic root and ascending aorta are structurally normal, with no evidence of dilitation. IAS/Shunts: No atrial level shunt detected by color flow Doppler.  LEFT VENTRICLE PLAX 2D LVIDd:         3.80 cm   Diastology LVIDs:          2.70 cm   LV e' medial:    9.57 cm/s LV PW:         1.00 cm   LV E/e' medial:  9.3 LV IVS:        0.90 cm   LV e' lateral:   6.53 cm/s LVOT diam:     2.00 cm   LV E/e' lateral: 13.7 LV SV:         55 LV SV Index:   32 LVOT Area:     3.14 cm  RIGHT VENTRICLE             IVC RV S prime:     14.30 cm/s  IVC diam: 1.50 cm TAPSE (M-mode): 1.6 cm LEFT ATRIUM             Index        RIGHT ATRIUM           Index LA diam:        3.20 cm 1.87 cm/m   RA Area:     14.70 cm LA Vol (A2C):   46.2 ml 26.93 ml/m  RA Volume:   35.30 ml  20.58 ml/m LA Vol (A4C):   34.9 ml 20.35 ml/m LA Biplane Vol: 40.4 ml 23.55 ml/m  AORTIC VALVE AV Area (Vmax): 2.26 cm AV Vmax:        127.00 cm/s AV Peak Grad:   6.5 mmHg LVOT Vmax:      91.30 cm/s LVOT Vmean:     60.700  cm/s LVOT VTI:       0.174 m  AORTA Ao Root diam: 2.90 cm Ao Asc diam:  3.00 cm MITRAL VALVE               TRICUSPID VALVE MV Area (PHT): 4.31 cm    TR Peak grad:   15.1 mmHg MV Decel Time: 176 msec    TR Vmax:        194.00 cm/s MV E velocity: 89.20 cm/s MV A velocity: 78.20 cm/s  SHUNTS MV E/A ratio:  1.14        Systemic VTI:  0.17 m                            Systemic Diam: 2.00 cm Vina Gull MD Electronically signed by Vina Gull MD Signature Date/Time: 10/26/2023/4:58:25 PM    Final (Updated)    MR ABDOMEN MRCP W WO CONTAST Result Date: 10/26/2023 CLINICAL DATA:  Right upper quadrant abdominal pain. Biliary disease suspected. EXAM: MRI ABDOMEN WITHOUT AND WITH CONTRAST (INCLUDING MRCP) TECHNIQUE: Multiplanar multisequence MR imaging of the abdomen was performed both before and after the administration of intravenous contrast. Heavily T2-weighted images of the biliary and pancreatic ducts were obtained, and three-dimensional MRCP images were rendered by post processing. CONTRAST:  7mL GADAVIST  GADOBUTROL  1 MMOL/ML IV SOLN COMPARISON:  CT scan from earlier same day. FINDINGS: Lower chest: Lower chest unremarkable. Hepatobiliary: Heterogeneous perfusion of liver  parenchyma on early postcontrast imaging without a discrete mass lesion evident. Loss of signal intensity in the liver parenchyma on out of phase T1 imaging is compatible with steatosis. As noted on CT, there is marked intrahepatic biliary duct dilatation with dilatation of the extrahepatic biliary tree. Common duct measures on the order of 16 mm diameter. Common bile duct in the head of the pancreas measures 13 mm diameter with common bile duct dilatation extending into the ampulla. MRCP imaging shows no evidence for choledocholithiasis. No obstructing lesion is discernible at the ampulla. No wall thickening or hyperenhancement associated with the extrahepatic biliary system. Pancreas: As noted on CT imaging, there is mild distention of the main pancreatic duct through the head of the pancreas. No evidence for mass lesion in the pancreas no evidence for restricted diffusion in the head of the pancreas. Spleen:  No splenomegaly. No suspicious focal mass lesion. Adrenals/Urinary Tract: Right adrenal gland unremarkable. 17 mm left adrenal nodule shows loss of signal intensity on out of phase T1 imaging consistent with adenoma. No hydronephrosis or enhancing renal mass lesion. Stomach/Bowel: Stomach is unremarkable. No gastric wall thickening. No evidence of outlet obstruction. Duodenum is normally positioned as is the ligament of Treitz. No small bowel or colonic dilatation within the visualized abdomen. Vascular/Lymphatic: No abdominal aortic aneurysm. No abdominal lymphadenopathy Other:  No intraperitoneal free fluid. Musculoskeletal: No focal suspicious marrow enhancement within the visualized bony anatomy. 3 mm focus of T2 hyperintensity with hyperenhancement is identified in the left psoas muscle (see postcontrast image 71 of series 1502), nonspecific but most likely benign. IMPRESSION: 1. Marked intrahepatic and extrahepatic biliary duct dilatation with dilatation of the main pancreatic duct through the head of  the pancreas. No evidence for choledocholithiasis or obstructing lesion at the ampulla. No wall thickening or hyperenhancement associated with the extrahepatic biliary system. ERCP may prove helpful to further evaluate. 2. 3-4 mm focus of hyperenhancement identified in the left psoas muscle, potentially a vascular malformation or nerve sheath tumor, but likely benign. Follow-up  MRI in 3-6 months could be used to ensure stability as clinically warranted. 3. Hepatic steatosis. 4. 17 mm left adrenal adenoma. Electronically Signed   By: Camellia Candle M.D.   On: 10/26/2023 08:37   CT ABDOMEN PELVIS WO CONTRAST Result Date: 10/25/2023 CLINICAL DATA:  Acute abdominal pain EXAM: CT ABDOMEN AND PELVIS WITHOUT CONTRAST TECHNIQUE: Multidetector CT imaging of the abdomen and pelvis was performed following the standard protocol without IV contrast. RADIATION DOSE REDUCTION: This exam was performed according to the departmental dose-optimization program which includes automated exposure control, adjustment of the mA and/or kV according to patient size and/or use of iterative reconstruction technique. COMPARISON:  02/03/2015 FINDINGS: Lower chest: Unremarkable Hepatobiliary: Cholecystectomy. Common hepatic duct 1.5 cm in diameter on image 22 series 3, formerly about 0.8 cm. Common bile duct 1.1 cm in diameter on image 30 series 3, formerly 0.5 cm. There is potentially some mild intrahepatic biliary dilatation as well. The biliary dilatation extends all the way to the ampullary region. Pancreas: Mild dorsal pancreatic duct dilatation distally, up to 4 mm in diameter on image 54 series 6. Spleen: Unremarkable Adrenals/Urinary Tract: 1.5 by 1.8 cm mass of the medial limb of the left adrenal gland with internal density -1 Hounsfield units, compatible with adrenal adenoma. No further imaging workup of this lesion is indicated. Otherwise unremarkable. Stomach/Bowel: Unremarkable Vascular/Lymphatic: Atherosclerosis is present,  including aortoiliac atherosclerotic disease. Reproductive: Uterus absent. Other: No supplemental non-categorized findings. Musculoskeletal: Disc bulges at L3-4 and L4-5. IMPRESSION: 1. Biliary dilatation extending all the way to the ampullary region, with mild distal pancreatic duct dilatation. Although some of this may be a physiologic response to prior cholecystectomy, this biliary prominence was not present on 02/03/2015 despite that also being a post cholecystectomy examination. Possibility of obstruction in the vicinity of the ampulla or distal pancreatic head is not excluded. Consider MRI abdomen with and without contrast with special attention to the pancreas ampulla, to include MRCP for further characterization. 2. Benign left adrenal adenoma. No further imaging workup of this lesion is indicated. 3. Disc bulges at L3-4 and L4-5. 4. Aortic atherosclerosis. Electronically Signed   By: Ryan Salvage M.D.   On: 10/25/2023 18:38   DG Chest 2 View Result Date: 10/25/2023 CLINICAL DATA:  Chest pain and pressure. EXAM: CHEST - 2 VIEW COMPARISON:  Chest radiograph dated 06/30/2021. FINDINGS: Right-sided Port-A-Cath with tip at the cavoatrial junction. No focal consolidation, pleural effusion, or pneumothorax. The cardiac silhouette is within normal limits. No acute osseous pathology. IMPRESSION: No active cardiopulmonary disease. Electronically Signed   By: Vanetta Chou M.D.   On: 10/25/2023 15:16       Elgie Butter M.D. [facility] 10/27/2023, 11:24 AM  Available via Epic secure chat 7am-7pm After 7 pm, please refer to night coverage provider listed on amion.

## 2023-10-27 NOTE — Progress Notes (Addendum)
 Daily Progress Note  DOA: 10/25/2023 Hospital Day: 3   Chief Complaint: N/V/D/ upper abdominal pain, IBS, dilated bile duct   ASSESSMENT    Brief Narrative:  [name] is a 58 y.o. year old female with a history of history of duodenal ulcer ( 2016), chronic pain with opioid dependence, IBS, HLD, HTN, tobacco abuse, pseudoseizure, migraines . Admitted with mid upper abdominal pain, nausea, vomiting and diarrhea. Incidental findings of biliary duct dilation. GI saw in consult 86/5  58 yo female admitted with mid upper abdominal pain / nausea / vomiting, acute on chronic diarrhea with volume depletion. P Possibly had gastroenteritis exacerbating her IBS-D.  GI path panel negative. Today:  No vomiting. Still has upper abdominal discomfort. No diarrhea since 7 am but taking very little of her clear liquid diet. CBC, CMET okay.  C. difficile pending  Hypokalemia K+ 3.3  Chronic IBS-D Symptoms gets worse depending on season. Manages with imodium  and bentyl    Biliary duct dilation but normal LFTs. Etiology ? Probably incidental finding and unrelated to GI symptoms  MRCP demonstrating post cholecystectomy,  intra / extrahepatic biliary duct dilation and mild PD dilation without evidence for evidence for choledocholithiasis or obstructing lesion at the ampulla or a pancreas lesion.  Today:  LFTs remain normal   Left psoas muscle abnormality on MRI -   3-4 mm focus of hyperenhancement identified in the left psoas muscle, potentially a vascular malformation or nerve sheath tumor, but likely benign    GERD Worsening symptoms on Ozempic over the last year. Tums help   Remote history of GI bleed  / duodenal ulcers likely 2/2 to NSAIDS   [facility] of colon cancer in two paternal uncles in their 58's.    Principal Problem:   Dilation of biliary tract Active Problems:   Hypokalemia   Acute diarrhea   Seizure-like activity (HCC)   Depression   Dehydration   AKI (acute kidney injury)  (HCC)   Tobacco abuse   Nausea & vomiting   PLAN   --Awaiting C. Difficile.  Sample not yet collected as her last bowel movement was around 7 this morning after vegetable broth.  -- Will try her on a soft diet.  Recommended she pick bland items from menu. --Biliary findings on imaging was reviewed with our biliary endoscopist and may be consistent with postcholecystectomy state and chronic narcotic use. Reassuring that LFTs are normal and there are no obstructing lesions. Will continue monitoring liver function tests in the outpatient setting. No need for ERCP or EUS at this time point.    Subjective   Had vegetable broth for breakfast then had a bout of diarrhea.  No bowel movement since around 7 AM.  Will submits stool for C. difficile when she has another bout of diarrhea.  Still has some mid upper abdominal discomfort.  Overall she just feels poor   Objective    Recent Labs    10/25/23 1404 10/26/23 0023 10/26/23 0654 10/27/23 0500  WBC 14.3*  --  8.3 8.2  HGB 18.6* 15.0 15.2* 14.5  HCT 52.1* 44.0 43.0 40.9  PLT 400  --  321 316   BMET Recent Labs    10/25/23 1404 10/26/23 0023 10/26/23 0654 10/26/23 2102 10/27/23 0500  NA 135 138 138  --  138  K 3.3* 3.0* 2.9* 2.8* 3.3*  CL 97*  --  103  --  100  CO2 22  --  26  --  28  GLUCOSE 100*  --  88  --  85  BUN 10  --  11  --  5*  CREATININE 1.10*  --  0.90  --  0.68  CALCIUM  10.5*  --  9.0  --  9.0   LFT Recent Labs    10/27/23 0500  PROT 6.1*  ALBUMIN  3.5  AST 20  ALT 21  ALKPHOS 60  BILITOT 0.7   PT/INR Recent Labs    10/25/23 1404  LABPROT 12.4  INR 0.9     Imaging:  ECHOCARDIOGRAM COMPLETE    ECHOCARDIOGRAM REPORT       Patient Name:   [name] Date of Exam: 10/26/2023 Medical Rec #:  [id number]  Height:       62.0 in Accession #:    [id number] Weight:       155.0 lb Date of Birth:  [birth year] BSA:          1.715 m Patient Age:    57 years   BP:           135/76 mmHg Patient Gender: F           HR:           70 bpm. Exam Location:  Inpatient  Procedure: 2D Echo, Cardiac Doppler and Color Doppler  Indications:     Chest Pain R07.9   History:         Patient has prior history of Echocardiogram examinations, most                  recent 07/20/2023. Cardiomegaly, Signs/Symptoms:Dyspnea; Risk                  Factors:Current Smoker.   Sonographer:     Thea Norlander RCS Referring Phys:  VERGIE ALES DOUTOVA Diagnosing Phys: Vina Gull MD  IMPRESSIONS   1. Left ventricular ejection fraction, by estimation, is 60 to 65%. The left ventricle has normal function. The left ventricle has no regional wall motion abnormalities. Left ventricular diastolic parameters were normal.  2. Right ventricular systolic function is normal. The right ventricular size is normal.  3. The mitral valve is normal in structure. Trivial mitral valve regurgitation.  4. The aortic valve is tricuspid. Aortic valve regurgitation is not visualized.  Comparison(s): The left ventricular function is unchanged.  FINDINGS  Left Ventricle: Left ventricular ejection fraction, by estimation, is 60 to 65%. The left ventricle has normal function. The left ventricle has no regional wall motion abnormalities. The left ventricular internal cavity size was normal in size. There is  no left ventricular hypertrophy. Left ventricular diastolic parameters were normal.  Right Ventricle: The right ventricular size is normal. Right vetricular wall thickness was not assessed. Right ventricular systolic function is normal.  Left Atrium: Left atrial size was normal in size.  Right Atrium: Right atrial size was normal in size.  Pericardium: There is no evidence of pericardial effusion.  Mitral Valve: The mitral valve is normal in structure. Trivial mitral valve regurgitation.  Tricuspid Valve: The tricuspid valve is normal in structure. Tricuspid valve regurgitation is trivial.  Aortic Valve: The aortic valve is  tricuspid. Aortic valve regurgitation is not visualized. Aortic valve peak gradient measures 6.5 mmHg.  Pulmonic Valve: The pulmonic valve was normal in structure. Pulmonic valve regurgitation is not visualized.  Aorta: The aortic root and ascending aorta are structurally normal, with no evidence of dilitation.  IAS/Shunts: No atrial level shunt detected by color flow Doppler.    LEFT VENTRICLE PLAX 2D LVIDd:  3.80 cm   Diastology LVIDs:         2.70 cm   LV e' medial:    9.57 cm/s LV PW:         1.00 cm   LV E/e' medial:  9.3 LV IVS:        0.90 cm   LV e' lateral:   6.53 cm/s LVOT diam:     2.00 cm   LV E/e' lateral: 13.7 LV SV:         55 LV SV Index:   32 LVOT Area:     3.14 cm    RIGHT VENTRICLE             IVC RV S prime:     14.30 cm/s  IVC diam: 1.50 cm TAPSE (M-mode): 1.6 cm  LEFT ATRIUM             Index        RIGHT ATRIUM           Index LA diam:        3.20 cm 1.87 cm/m   RA Area:     14.70 cm LA Vol (A2C):   46.2 ml 26.93 ml/m  RA Volume:   35.30 ml  20.58 ml/m LA Vol (A4C):   34.9 ml 20.35 ml/m LA Biplane Vol: 40.4 ml 23.55 ml/m  AORTIC VALVE AV Area (Vmax): 2.26 cm AV Vmax:        127.00 cm/s AV Peak Grad:   6.5 mmHg LVOT Vmax:      91.30 cm/s LVOT Vmean:     60.700 cm/s LVOT VTI:       0.174 m   AORTA Ao Root diam: 2.90 cm Ao Asc diam:  3.00 cm  MITRAL VALVE               TRICUSPID VALVE MV Area (PHT): 4.31 cm    TR Peak grad:   15.1 mmHg MV Decel Time: 176 msec    TR Vmax:        194.00 cm/s MV E velocity: 89.20 cm/s MV A velocity: 78.20 cm/s  SHUNTS MV E/A ratio:  1.14        Systemic VTI:  0.17 m                            Systemic Diam: 2.00 cm  Vina Gull MD Electronically signed by Vina Gull MD Signature Date/Time: 10/26/2023/4:58:25 PM      Final (Updated)   MR ABDOMEN MRCP W WO CONTAST CLINICAL DATA:  Right upper quadrant abdominal pain. Biliary disease suspected.  EXAM: MRI ABDOMEN WITHOUT AND WITH CONTRAST  (INCLUDING MRCP)  TECHNIQUE: Multiplanar multisequence MR imaging of the abdomen was performed both before and after the administration of intravenous contrast. Heavily T2-weighted images of the biliary and pancreatic ducts were obtained, and three-dimensional MRCP images were rendered by post processing.  CONTRAST:  7mL GADAVIST  GADOBUTROL  1 MMOL/ML IV SOLN  COMPARISON:  CT scan from earlier same day.  FINDINGS: Lower chest: Lower chest unremarkable.  Hepatobiliary: Heterogeneous perfusion of liver parenchyma on early postcontrast imaging without a discrete mass lesion evident. Loss of signal intensity in the liver parenchyma on out of phase T1 imaging is compatible with steatosis. As noted on CT, there is marked intrahepatic biliary duct dilatation with dilatation of the extrahepatic biliary tree. Common duct measures on the order of 16 mm diameter. Common bile duct in the head  of the pancreas measures 13 mm diameter with common bile duct dilatation extending into the ampulla. MRCP imaging shows no evidence for choledocholithiasis. No obstructing lesion is discernible at the ampulla. No wall thickening or hyperenhancement associated with the extrahepatic biliary system.  Pancreas: As noted on CT imaging, there is mild distention of the main pancreatic duct through the head of the pancreas. No evidence for mass lesion in the pancreas no evidence for restricted diffusion in the head of the pancreas.  Spleen:  No splenomegaly. No suspicious focal mass lesion.  Adrenals/Urinary Tract: Right adrenal gland unremarkable. 17 mm left adrenal nodule shows loss of signal intensity on out of phase T1 imaging consistent with adenoma. No hydronephrosis or enhancing renal mass lesion.  Stomach/Bowel: Stomach is unremarkable. No gastric wall thickening. No evidence of outlet obstruction. Duodenum is normally positioned as is the ligament of Treitz. No small bowel or colonic  dilatation within the visualized abdomen.  Vascular/Lymphatic: No abdominal aortic aneurysm. No abdominal lymphadenopathy  Other:  No intraperitoneal free fluid.  Musculoskeletal: No focal suspicious marrow enhancement within the visualized bony anatomy. 3 mm focus of T2 hyperintensity with hyperenhancement is identified in the left psoas muscle (see postcontrast image 71 of series 1502), nonspecific but most likely benign.  IMPRESSION: 1. Marked intrahepatic and extrahepatic biliary duct dilatation with dilatation of the main pancreatic duct through the head of the pancreas. No evidence for choledocholithiasis or obstructing lesion at the ampulla. No wall thickening or hyperenhancement associated with the extrahepatic biliary system. ERCP may prove helpful to further evaluate. 2. 3-4 mm focus of hyperenhancement identified in the left psoas muscle, potentially a vascular malformation or nerve sheath tumor, but likely benign. Follow-up MRI in 3-6 months could be used to ensure stability as clinically warranted. 3. Hepatic steatosis. 4. 17 mm left adrenal adenoma.  Electronically Signed   By: Camellia Candle M.D.   On: 10/26/2023 08:37     Scheduled inpatient medications:   amLODipine   5 mg Oral Daily   busPIRone   15 mg Oral TID   Chlorhexidine  Gluconate Cloth  6 each Topical Daily   cholecalciferol   400 Units Oral Daily   gabapentin   300 mg Oral QHS   lamoTRIgine   25 mg Oral Daily   melatonin  10 mg Oral QHS   pantoprazole   40 mg Oral Q0600   rosuvastatin   20 mg Oral QHS   sodium chloride  flush  10-40 mL Intracatheter Q12H   tapentadol   50 mg Oral Q12H   Continuous inpatient infusions:  PRN inpatient medications: dicyclomine , fentaNYL  (SUBLIMAZE ) injection, methocarbamol , ondansetron  **OR** ondansetron  (ZOFRAN ) IV, sodium chloride  flush, tapentadol   Vital signs in last 24 hours: Temp:  [97.9 F (36.6 C)-99.2 F (37.3 C)] 98.1 F (36.7 C) (02/06 1431) Pulse Rate:   [73-100] 84 (02/06 1431) Resp:  [19-20] 19 (02/06 1431) BP: (122-132)/(72-83) 122/78 (02/06 1431) SpO2:  [92 %-100 %] 92 % (02/06 1431) Last BM Date : 10/26/23  Intake/Output Summary (Last 24 hours) at 10/27/2023 1535 Last data filed at 10/27/2023 1300 Gross per 24 hour  Intake 1060 ml  Output --  Net 1060 ml    Intake/Output from previous day: 02/05 0701 - 02/06 0700 In: 960 [P.O.:960] Out: -  Intake/Output this shift: Total I/O In: 340 [P.O.:340] Out: -    Physical Exam:  General: Alert female in NAD Heart:  Regular rate and rhythm.  Pulmonary: Normal respiratory effort Abdomen: Soft, nondistended, nontender. Normal bowel sounds. Extremities: No lower extremity edema  Neurologic: Alert  and oriented Psych: Pleasant. Cooperative. Insight appears normal.      LOS: 1 day   Vina Dasen ,NP 10/27/2023, 3:35 PM   I have taken history, reviewed the chart and examined the patient. I performed a substantive portion of this encounter, including complete performance of at least one of the key components, in conjunction with the APP. I agree with the Advanced Practitioner's note, impression and recommendations.   [name] reports that her diarrhea is unchanged today.  Continues to have frequent, loose watery stools.  Endorses abdominal discomfort and upset after consuming chicken broth yesterday.  Tolerated some Jell-O and Italian ice today.  Expresses concern regarding understanding the cause of her diarrhea and generalized abdominal discomfort.  On exam she is alert and in no distress Abdomen is soft and tender to palpation in the epigastrium without rebound or guarding  GI pathogen panel is negative Liver enzymes were repeated and are normal  Today we recommended obtaining C. difficile which is pending.  Reviewed that other etiologies of her diarrhea could include forms of SIBO or bile acid malabsorption related to postcholecystectomy state.  A form of postinfectious enteritis  and exacerbation of IBS-D is not entirely excluded as I informed [name] that not all pathogen's are known and may not be picked up on pathogen panels.  If her symptoms are persisting despite negative laboratory workup reviewed that we could consider EGD/colonoscopy for small bowel and colon biopsies.  Inocente Hausen, MD [facility]

## 2023-10-27 NOTE — Progress Notes (Signed)
...............  03  0

## 2023-10-27 NOTE — Consult Note (Signed)
 [facility] Psychiatric Consult Initial  Patient Name: .[name]  MRN: [id number]  DOB: Mar 18, 1966  Consult Order details:  Dr. Constantino   Mode of Visit: In person    Psychiatry Consult Evaluation  Service Date: October 27, 2023 LOS:  LOS: 1 day  Chief Complaint I can feel myself spiralling  Primary Psychiatric Diagnoses  Treatment resistant depression 2.  Generalized anxiety disorder with panic attacks 3.  Bulimia   Assessment  [name] is a 58 y.o. female admitted: Medically for 10/25/2023 12:48 PM for N+V. She carries the psychiatric diagnoses of depression, anxiety and remote EtOH abuse and has a past medical history of  DJD, endometriosis, HLD, HTN, migraines, pseudoseizure and seizure.   Her current presentation of several months of worsening of low mood, re-emergence of chornic SI, etc is most consistent with known diagnosis of depression.  Feel seasonal component as wellShe meets criteria for GAD with panic attacks and bulimia (early remission) based on clinical interview.  Current outpatient psychotropic medications include amitriptyline  100 mg, buspirone  15 mg BID and historically she has had a fair response to these medications - usually keep her at mild/mod depression in summer with exacerbations every winter. She was compliant with medications prior to admission as evidenced by pt report, fill hx3. On initial examination, patient was quite pleasant and a good historian. She was unwilling to consider medications that would require monitoring (ie low dose lithium, thyroid  hormone) but very open to other modalities. Please see plan below for detailed recommendations.   Diagnoses:  Active Hospital problems: Principal Problem:   Dilation of biliary tract Active Problems:   Hypokalemia   Acute diarrhea   Seizure-like activity (HCC)   Depression   Dehydration   AKI (acute kidney injury) (HCC)   Tobacco abuse   Nausea & vomiting    Plan   ## Psychiatric  Medication Recommendations:  -- currently on buspirone  15 TID (increased from home dose 15 BID) -- also on amitriptyline  100 mg (backed off bc of TD)  -- s vitamin D (last lab low w/ PCP outside [facility] system) -- s lamotrigine  25 mg with plan to uptitrate to ?100-150 mg? As outpt.  -- melatonin   Discussed proper use of sun lamp in AM  ## Medical Decision Making Capacity: Not specifically addressed in this encounter  ## Further Work-up:  -- vit B12, folate (organic contributers to depression)  -- most recent EKG on 2/4 had QtC of 454 -- Pertinent labwork reviewed earlier this admission includes: TSH wnl, pt's cell phone had EMR of PCP - vit D low at 28  From pt's outside records: Vit D low at 28 mg within last year   ## Disposition:-- There are no psychiatric contraindications to discharge at this time Have asked SW to refer to PHP or IOP closer to dc  ## Behavioral / Environmental: - No specific recommendations at this time.     ## Safety and Observation Level:  - Based on my clinical evaluation, I estimate the patient to be at low risk of self harm in the current setting. - At this time, we recommend  routine. This decision is based on my review of the chart including patient's history and current presentation, interview of the patient, mental status examination, and consideration of suicide risk including evaluating suicidal ideation, plan, intent, suicidal or self-harm behaviors, risk factors, and protective factors. This judgment is based on our ability to directly address suicide risk, implement suicide prevention strategies, and develop a safety  plan while the patient is in the clinical setting. Please contact our team if there is a concern that risk level has changed.  CSSR Risk Category:C-SSRS RISK CATEGORY: No Risk  Suicide Risk Assessment: Patient has following modifiable risk factors for suicide: under treated depression  and lack of access to outpatient mental health  resources, which we are addressing by referring to therapy, changing med regimen . Patient has following non-modifiable or demographic risk factors for suicide: history of suicide attempt, history of self harm behavior, and psychiatric hospitalization Patient has the following protective factors against suicide: Cultural, spiritual, or religious beliefs that discourage suicide, devotion to family, last suicide attempt > 5 years ago  Thank you for this consult request. Recommendations have been communicated to the primary team.  We will continue to follow at this time.   Elinor Kea, MD       History of Present Illness  Relevant Aspects of [facility] Course:  Admitted on 10/25/2023 for GI distress. They endrosed hx depression.   Patient Report:  Patient endorses long history of struggles with mental illness.  Had several suicide attempts in 2015/2016 culminating in a large overdose on Tylenol  which required a month to recover from medically and ultimately resulted in 46-month hospitalization to [facility].  Since discharge, she has done overall much better and has not had a suicide attempt since that time - fear of going back to [place] as well as suicide attempts in families large motivator. Many personal losses [phone], did better 2020-now. Tends to be mild/mod depressed in summer, severely depressed in winter. Main goal is a life I find worth living which she has every summer. Chronic passive suicidality, no active thoughts/plan. Was hoping to discuss medication changes that might help with mood sooner than the season changing. Not open to discussing anything she might need ongoing labwork for (ie lithium, thyroid , antipsychotics, etc). Open to plan as above discussed r/b/se of lamotrigine  inc rash and need for compliance. No allergy to gabapentin  - was unable to remove this.   Strong seasonal component. Never tried light therapy.   Feb 6: Patient reported ok mood. She is apprehensive of  the workup and if they are able to find answers. Feels that her depression and anxiety are directly related to her IBS.   We discussed med trials so far. She tried ECT in 2016 with good effect at that time. Referral for TMS was declined by insurance company per her report. Never discussed Spravato or Auvelity.  She thinks that her home dose of Buspar  is 15 mg BID (but could not confirm). She understands that she is currently on 15 mg TID.   Psych ROS:  Depression: low mood, low motivation (difficulty getting out of bed most days), usually eating too little w/ binges (on semaglutide), sleep is pretty good - at least 6 hours a night, concentration OK,  chronic suicidal thoughts w/o intent or plan.  Anxiety:  paralyzing fear, social anxiety, has some panic attacks (recently 2-3/week) but background anxiety is main problem.  Mania (lifetime and current): denies Psychosis: (lifetime and current): debues  Collateral information:  None today  ROS - GI distress. Had some burninig in arm when IV ran out before K+  Psychiatric and Social History  Psychiatric History:  Information collected from pt, medical record  Prev Dx/Sx: depression, anxiety. Remote hx bulimia (couple weeks ago) Reports long history of fairly treatmetn resistant depression. Last suicidea ttempt (of 10+) in 2016 which was followed by 6 month hospitalizaiton  at [facility]. Dx w/ bipolar in 2010s pt states to get access to addl meds denies hx mania.  Current Psych Provider: Mojeed Akintayo q2-3  Home Meds (current):  buspirone  15 TID Amitriptyline  100 mg at bedtime - apparently had TD on higher dose of this.   Previous Med Trials:  prozac  20 mg Numerous other med trials - basically every antidepressant, most commonly used antipsychotics   On allergy list:  Gabapentin  (abd pain, GI bleed) - pt denies - tried to remove  Therapy: not currently   Prior Psych Hospitalization: numerous. Notably got several sessions of ECT in [facility]  system in 2020. Stopped working. Approved for TMS at one point but no-showed.  Prior Self Harm: 10+ lifetime suicide attempts mostly on Tylenol  Prior Violence: no  Family Psych History: aunt bipolar, older sx w/ mood instability, brother who is a monster Family Hx suicide: uncle completed, nephew completed, dad attempted  Social History:  Developmental Hx: deferred Educational Hx: 2 years college major undecided Occupational Hx: disability for depression Legal Hx: deferred Living Situation: with dog  Spiritual Hx: agnostic Access to weapons/lethal means: no, denies large stockpiles meds  Substance History Alcohol: 2 days a year - priorly significant Type of alcohol: margaritas at [employer] Last Drink deferred Number of drinks per day deferred History of alcohol withdrawal seizuresL I think I probably did History of DT's: pt unsure Tobacco: 4 cigarettes/day declined patch Illicit drugs: denies Prescription drug abuse: denied Rehab hx: no  Exam Findings  Physical Exam:  Vital Signs:  Temp:  [97.9 F (36.6 C)-99.2 F (37.3 C)] 98.1 F (36.7 C) (02/06 1431) Pulse Rate:  [73-100] 84 (02/06 1431) Resp:  [19-20] 19 (02/06 1431) BP: (122-132)/(72-83) 122/78 (02/06 1431) SpO2:  [92 %-100 %] 92 % (02/06 1431) Blood pressure 122/78, pulse 84, temperature 98.1 F (36.7 C), temperature source Oral, resp. rate 19, height 5' 2 (1.575 m), weight 70.3 kg, SpO2 92%. Body mass index is 28.35 kg/m.  Physical Exam HENT:     Head: Normocephalic.  Eyes:     Conjunctiva/sclera: Conjunctivae normal.  Pulmonary:     Effort: Pulmonary effort is normal.  Neurological:     Mental Status: She is alert and oriented to person, place, and time.     Mental Status Exam: General Appearance: Fairly Groomed - hair brushed, earrings in, light makeup.   Orientation:  Full (Time, Place, and Person)  Memory:  Immediate;   Good Recent;   Good Remote;   Good  Concentration:  Concentration:  Good  Recall:  Good  Attention  Good  Eye Contact:  Good  Speech:  Clear and Coherent  Language:  Good  Volume:  Normal  Mood: Depressed  Affect:  Congruent - brightened v briefly   Thought Process:  Coherent  Thought Content:  Logical and devoid of delusions/paranoia  Suicidal Thoughts:  No  Homicidal Thoughts:  No  Judgement:  Fair  Insight:  Fair  Psychomotor Activity:  Normal  Akathisia:  No  Fund of Knowledge:  Good   No perceptural disturbances endorsed/observed  Assets:  Communication Skills Desire for Improvement Financial Resources/Insurance Housing Leisure Time  Cognition:  WNL  ADL's:  not formally assessed  AIMS (if indicated):        Other History   These have been pulled in through the EMR, reviewed, and updated if appropriate.  Family History:  The patient's family history includes Cancer in an other family member; Heart attack in her father; Heart failure in her  father; Hyperlipidemia in her half-sister, half-sister, and mother; Hypertension in her half-sister, half-sister, and mother.  Medical History: Past Medical History:  Diagnosis Date   Anorexia    Anxiety    Depression    DJD (degenerative joint disease) of cervical spine    Endometriosis    ETOH abuse    sober 3 1/2 years   Hyperlipidemia    Hypertension    Migraines    PICC (peripherally inserted central catheter) in place    rt neck   Psychiatric pseudoseizure    Seizures (HCC)     Surgical History: Past Surgical History:  Procedure Laterality Date   ABDOMINAL HYSTERECTOMY     ABDOMINAL SURGERY     APPENDECTOMY     CARPAL TUNNEL RELEASE     2010   CHOLECYSTECTOMY     ESOPHAGOGASTRODUODENOSCOPY N/A 10/06/2014   Procedure: ESOPHAGOGASTRODUODENOSCOPY (EGD);  Surgeon: Lamar JONETTA Aho, MD;  Location[facility] ENDOSCOPY;  Service: Endoscopy;  Laterality: N/A;   KNEE SURGERY     laproscopy     NASAL SINUS SURGERY     PERICARDIOCENTESIS N/A 03/16/2021   Procedure: PERICARDIOCENTESIS;   Surgeon: Elmira Newman PARAS, MD;  Location: [facility] INVASIVE CV LAB;  Service: Cardiovascular;  Laterality: N/A;   SPINAL CORD STIMULATOR REMOVAL N/A 12/18/2021   Procedure: Removal of spinal cord stimulator;  Surgeon: Darlis Deatrice RAMAN, MD;  Location: [facility] OR;  Service: Neurosurgery;  Laterality: N/A;  RM 19   VIDEO ASSISTED THORACOSCOPY Right 03/18/2021   Procedure: VIDEO ASSISTED THORACOSCOPY FOR PERICARDIAL WINDOW;  Surgeon: Shyrl Linnie KIDD, MD;  Location: [facility] OR;  Service: Thoracic;  Laterality: Right;  pericardial window     Medications:   Current Facility-Administered Medications:    amLODipine  (NORVASC ) tablet 5 mg, 5 mg, Oral, Daily, Akula, Vijaya, MD, 5 mg at 10/27/23 1009   busPIRone  (BUSPAR ) tablet 15 mg, 15 mg, Oral, TID, Cinderella, Margaret A, 15 mg at 10/27/23 1010   Chlorhexidine  Gluconate Cloth 2 % PADS 6 each, 6 each, Topical, Daily, Akula, Vijaya, MD, 6 each at 10/27/23 1013   cholecalciferol  (VITAMIN D3) 10 MCG (400 UNIT) tablet 400 Units, 400 Units, Oral, Daily, Cinderella, Margaret A, 400 Units at 10/27/23 1012   dicyclomine  (BENTYL ) tablet 40 mg, 40 mg, Oral, QID PRN, Akula, Vijaya, MD   fentaNYL  (SUBLIMAZE ) injection 12.5-50 mcg, 12.5-50 mcg, Intravenous, Q2H PRN, Doutova, Anastassia, MD, 50 mcg at 10/27/23 1333   gabapentin  (NEURONTIN ) capsule 300 mg, 300 mg, Oral, QHS, Doutova, Anastassia, MD, 300 mg at 10/26/23 2239   lamoTRIgine  (LAMICTAL ) tablet 25 mg, 25 mg, Oral, Daily, Cinderella, Margaret A, 25 mg at 10/27/23 1009   melatonin tablet 10 mg, 10 mg, Oral, QHS, Akula, Vijaya, MD, 10 mg at 10/27/23 0023   methocarbamol  (ROBAXIN ) tablet 750 mg, 750 mg, Oral, Q8H PRN, Akula, Vijaya, MD, 750 mg at 10/27/23 1009   ondansetron  (ZOFRAN ) tablet 4 mg, 4 mg, Oral, Q6H PRN, 4 mg at 10/27/23 1420 **OR** ondansetron  (ZOFRAN ) injection 4 mg, 4 mg, Intravenous, Q6H PRN, Doutova, Anastassia, MD, 4 mg at 10/26/23 1159   pantoprazole  (PROTONIX ) EC tablet 40 mg, 40 mg, Oral, Q0600, Akula,  Vijaya, MD, 40 mg at 10/27/23 9356   rosuvastatin  (CRESTOR ) tablet 20 mg, 20 mg, Oral, QHS, Doutova, Anastassia, MD, 20 mg at 10/26/23 2238   sodium chloride  flush (NS) 0.9 % injection 10-40 mL, 10-40 mL, Intracatheter, Q12H, Akula, Vijaya, MD, 10 mL at 10/27/23 1013   sodium chloride  flush (NS) 0.9 % injection 10-40 mL, 10-40  mL, Intracatheter, PRN, Akula, Vijaya, MD   tapentadol  (NUCYNTA ) 12 hr tablet 50 mg, 50 mg, Oral, Q12H, Doutova, Anastassia, MD, 50 mg at 10/27/23 1009   tapentadol  (NUCYNTA ) tablet 50 mg, 50 mg, Oral, TID PRN, Akula, Vijaya, MD, 50 mg at 10/27/23 1420  Allergies: Allergies  Allergen Reactions   Aspirin  Other (See Comments)    GI bleed.   Coconut (Cocos Nucifera) Rash   Doxycycline Anaphylaxis and Other (See Comments)    Joint damage also   Gabapentin      Other reaction(s): Abdominal Pain, GI Bleed   Imitrex [Sumatriptan Base] Other (See Comments)    Severe hypertension, all triptans   Iodinated Contrast Media Anaphylaxis   Lactose Diarrhea    Other reaction(s): GI Upset (intolerance)   Lidocaine  Anaphylaxis    Pt states she does well with Marcaine /Bupivicaine without problem   Prednisone  Other (See Comments)    She goes crazy   Sulfa Antibiotics Anaphylaxis   Sulfasalazine Anaphylaxis   Sumatriptan Other (See Comments)    Felt like she was having a stroke, heart rate and blood pressure went through the roof   Levofloxacin Other (See Comments)    Joints hurt   Amoxicillin-Pot Clavulanate Other (See Comments)    Pt can't remember what type of reaction she had, but states her pharmacy has it listed in their allergy list.  12/18/21 Pt has tolerated Ancef  in the past.   Caffeine -Sodium Benzoate     Other reaction(s): Dizziness (intolerance)   Other Other (See Comments)    Artificial sugar - migraine   Prochlorperazine  Other (See Comments)    Tonic/clonic seizure   Latex Rash   Levofloxacin Rash and Other (See Comments)    Joints hurt   Metrizamide  Other (See Comments)   Nsaids Rash and Other (See Comments)    GI bleed   Tolmetin Rash    Elinor Kea, MD

## 2023-10-28 DIAGNOSIS — K580 Irritable bowel syndrome with diarrhea: Secondary | ICD-10-CM | POA: Diagnosis not present

## 2023-10-28 DIAGNOSIS — E876 Hypokalemia: Secondary | ICD-10-CM | POA: Diagnosis not present

## 2023-10-28 DIAGNOSIS — R1013 Epigastric pain: Secondary | ICD-10-CM | POA: Diagnosis not present

## 2023-10-28 DIAGNOSIS — R112 Nausea with vomiting, unspecified: Secondary | ICD-10-CM | POA: Diagnosis not present

## 2023-10-28 DIAGNOSIS — R569 Unspecified convulsions: Secondary | ICD-10-CM | POA: Diagnosis not present

## 2023-10-28 DIAGNOSIS — K838 Other specified diseases of biliary tract: Secondary | ICD-10-CM | POA: Diagnosis not present

## 2023-10-28 DIAGNOSIS — N179 Acute kidney failure, unspecified: Secondary | ICD-10-CM | POA: Diagnosis not present

## 2023-10-28 LAB — BASIC METABOLIC PANEL
Anion gap: 10 (ref 5–15)
BUN: 6 mg/dL (ref 6–20)
CO2: 29 mmol/L (ref 22–32)
Calcium: 9.5 mg/dL (ref 8.9–10.3)
Chloride: 101 mmol/L (ref 98–111)
Creatinine, Ser: 0.77 mg/dL (ref 0.44–1.00)
GFR, Estimated: >60 mL/min (ref >60)
Glucose, Bld: 78 mg/dL (ref 70–99)
Potassium: 3.4 mmol/L — ABNORMAL LOW (ref 3.5–5.1)
Sodium: 140 mmol/L (ref 135–145)

## 2023-10-28 MED ORDER — VITAMIN B-12 100 MCG PO TABS
500.0000 ug | ORAL_TABLET | Freq: Every day | ORAL | Status: DC
  Administered 2023-10-28 – 2023-10-31 (×3): 500 ug via ORAL
  Filled 2023-10-28 (×2): qty 5

## 2023-10-28 MED ORDER — AMITRIPTYLINE HCL 50 MG PO TABS
100.0000 mg | ORAL_TABLET | Freq: Every day | ORAL | Status: DC
  Administered 2023-10-28 – 2023-10-30 (×3): 100 mg via ORAL
  Filled 2023-10-28 (×3): qty 2

## 2023-10-28 MED ORDER — DICYCLOMINE HCL 20 MG PO TABS
20.0000 mg | ORAL_TABLET | Freq: Three times a day (TID) | ORAL | Status: DC
  Administered 2023-10-28 – 2023-10-29 (×5): 20 mg via ORAL
  Filled 2023-10-28 (×7): qty 1

## 2023-10-28 MED ORDER — SIMETHICONE 80 MG PO CHEW
80.0000 mg | CHEWABLE_TABLET | Freq: Four times a day (QID) | ORAL | Status: DC | PRN
  Administered 2023-10-28 – 2023-10-29 (×3): 80 mg via ORAL
  Filled 2023-10-28 (×3): qty 1

## 2023-10-28 NOTE — Progress Notes (Signed)
 [facility]                                                                               [name], is a 58 y.o. female, DOB - March 30, 1966, FMW:[id number] Admit date - 10/25/2023    Outpatient Primary MD for the patient is [name], [name]  LOS - 2  days    Brief summary    [name] is a 58 y.o. female with medical history significant of   migraine,depression, HLD, PUD, IBS, hypertension, ongoing tobacco use, pseudoseizures, admitted for nausea, vomiting , diarrhea and abdominal pain.  Assessment & Plan    Assessment and Plan:   Nausea, vomiting, diarrhea and abdominal pain in the setting of IBS As per the patient, she has chronic diarrhea from IBS.  She also reports her diarrhea worsens with seasonal worsening of depression.  Improvement in her nausea, vomiting and diarrhea.  MRCP done, showing dilated intra and extrahepatic biliary ducts.  GI consulted , suggested the dilated intra and extrahepatic ducts probably a post cholecystectomy state and chronic narcotic use.  Starting her on clears and currently on regular diet.  GI panel is negative   H/o PUD, duodenal ulcer in the past Continue with PPI.    IBS Recommend outpatient follow up with GI.    Hypokalemia:  Replaced ,repeat levels are pending.    Hypertension Optimal BP parameters.  Continue with norvasc  5 mg daily.    Hyperlipidemia Resume crestor .    Anxiety and depression.  Psychiatry consulted.  No suicidal ideations.  Vit b12 204 , low normal. Vitamin b12 supplementation will be added.  Folate wnl.  Continue with Buspar  15 mg TID, LAMICTAL   25 mg daily, amitriptyline  added today.    Pseudoseizures:  Continue with gabapentin  300 mg daily at bedtime.     Estimated body mass index is 28.35 kg/m as calculated from the following:   Height as of this encounter: 5' 2 (1.575 m).   Weight as of this encounter: 70.3 kg.  Code Status: full code.  DVT Prophylaxis:  SCDs  Start: 10/26/23 0307   Level of Care: Level of care: Med-Surg Family Communication: none at bedside.   Disposition Plan:     Remains inpatient appropriate:  ongoing diarrhea.   Procedures:  MRCP  Consultants:   Psychiatry gastroenterology  Antimicrobials:   Anti-infectives (From admission, onward)    None        Medications  Scheduled Meds:  amitriptyline   100 mg Oral QHS   amLODipine   5 mg Oral Daily   busPIRone   15 mg Oral TID   Chlorhexidine  Gluconate Cloth  6 each Topical Daily   cholecalciferol   400 Units Oral Daily   dicyclomine   20 mg Oral TID AC & HS   gabapentin   300 mg Oral QHS   lamoTRIgine   25 mg Oral Daily   melatonin  10 mg Oral QHS   pantoprazole   40 mg Oral Q0600   rosuvastatin   20 mg Oral QHS   sodium chloride  flush  10-40 mL Intracatheter Q12H   tapentadol   50 mg Oral Q12H   Continuous Infusions:   PRN Meds:.dicyclomine , fentaNYL  (SUBLIMAZE ) injection, methocarbamol , ondansetron  **  OR** ondansetron  (ZOFRAN ) IV, simethicone , sodium chloride  flush, tapentadol     Subjective:   [name] was seen and examined today. She reports feeling better .   Objective:   Vitals:   10/27/23 1431 10/27/23 1940 10/28/23 0508 10/28/23 0700  BP: 122/78 136/84 120/89 120/82  Pulse: 84 73 93 83  Resp: 19 16 20 19   Temp: 98.1 F (36.7 C) 97.8 F (36.6 C) 98.2 F (36.8 C) 98.5 F (36.9 C)  TempSrc: Oral Oral Oral Oral  SpO2: 92% 100% 99% 99%  Weight:      Height:        Intake/Output Summary (Last 24 hours) at 10/28/2023 1439 Last data filed at 10/28/2023 1300 Gross per 24 hour  Intake 960 ml  Output --  Net 960 ml   Filed Weights   10/25/23 1254 10/26/23 0315  Weight: 73 kg 70.3 kg     Exam General exam: Appears calm and comfortable  Respiratory system: Clear to auscultation. Respiratory effort normal. Cardiovascular system: S1 & S2 heard, RRR. Gastrointestinal system: Abdomen is nondistended, soft and nontender.  Central nervous system:  Alert and oriented. No focal neurological deficits. Extremities: Symmetric 5 x 5 power. Skin: No rashes, lesions or ulcers Psychiatry:  Mood & affect appropriate.     Data Reviewed:  I have personally reviewed following labs and imaging studies   CBC Lab Results  Component Value Date   WBC 8.2 10/27/2023   RBC 4.52 10/27/2023   HGB 14.5 10/27/2023   HCT 40.9 10/27/2023   MCV 90.5 10/27/2023   MCH 32.1 10/27/2023   PLT 316 10/27/2023   MCHC 35.5 10/27/2023   RDW 13.0 10/27/2023   LYMPHSABS 3.1 10/27/2023   MONOABS 0.7 10/27/2023   EOSABS 0.2 10/27/2023   BASOSABS 0.0 10/27/2023     Last metabolic panel Lab Results  Component Value Date   NA 138 10/27/2023   K 3.3 (L) 10/27/2023   CL 100 10/27/2023   CO2 28 10/27/2023   BUN 5 (L) 10/27/2023   CREATININE 0.68 10/27/2023   GLUCOSE 85 10/27/2023   GFRNONAA >60 10/27/2023   GFRAA >60 02/05/2019   CALCIUM  9.0 10/27/2023   PHOS 3.5 10/26/2023   PROT 6.1 (L) 10/27/2023   ALBUMIN  3.5 10/27/2023   BILITOT 0.7 10/27/2023   ALKPHOS 60 10/27/2023   AST 20 10/27/2023   ALT 21 10/27/2023   ANIONGAP 10 10/27/2023    CBG (last 3)  No results for input(s): GLUCAP in the last 72 hours.    Coagulation Profile: Recent Labs  Lab 10/25/23 1404  INR 0.9     Radiology Studies: ECHOCARDIOGRAM COMPLETE Result Date: 10/26/2023    ECHOCARDIOGRAM REPORT   Patient Name:   [name] Date of Exam: 10/26/2023 Medical Rec #:  [id number]  Height:       62.0 in Accession #:    [id number] Weight:       155.0 lb Date of Birth:  1966-03-11 BSA:          1.715 m Patient Age:    57 years   BP:           135/76 mmHg Patient Gender: F          HR:           70 bpm. Exam Location:  Inpatient Procedure: 2D Echo, Cardiac Doppler and Color Doppler Indications:     Chest Pain R07.9  History:         Patient has prior history of  Echocardiogram examinations, most                  recent 07/20/2023. Cardiomegaly, Signs/Symptoms:Dyspnea; Risk                   Factors:Current Smoker.  Sonographer:     Thea Norlander RCS Referring Phys:  VERGIE ALES DOUTOVA Diagnosing Phys: Vina Gull MD IMPRESSIONS  1. Left ventricular ejection fraction, by estimation, is 60 to 65%. The left ventricle has normal function. The left ventricle has no regional wall motion abnormalities. Left ventricular diastolic parameters were normal.  2. Right ventricular systolic function is normal. The right ventricular size is normal.  3. The mitral valve is normal in structure. Trivial mitral valve regurgitation.  4. The aortic valve is tricuspid. Aortic valve regurgitation is not visualized. Comparison(s): The left ventricular function is unchanged. FINDINGS  Left Ventricle: Left ventricular ejection fraction, by estimation, is 60 to 65%. The left ventricle has normal function. The left ventricle has no regional wall motion abnormalities. The left ventricular internal cavity size was normal in size. There is  no left ventricular hypertrophy. Left ventricular diastolic parameters were normal. Right Ventricle: The right ventricular size is normal. Right vetricular wall thickness was not assessed. Right ventricular systolic function is normal. Left Atrium: Left atrial size was normal in size. Right Atrium: Right atrial size was normal in size. Pericardium: There is no evidence of pericardial effusion. Mitral Valve: The mitral valve is normal in structure. Trivial mitral valve regurgitation. Tricuspid Valve: The tricuspid valve is normal in structure. Tricuspid valve regurgitation is trivial. Aortic Valve: The aortic valve is tricuspid. Aortic valve regurgitation is not visualized. Aortic valve peak gradient measures 6.5 mmHg. Pulmonic Valve: The pulmonic valve was normal in structure. Pulmonic valve regurgitation is not visualized. Aorta: The aortic root and ascending aorta are structurally normal, with no evidence of dilitation. IAS/Shunts: No atrial level shunt detected by color flow  Doppler.  LEFT VENTRICLE PLAX 2D LVIDd:         3.80 cm   Diastology LVIDs:         2.70 cm   LV e' medial:    9.57 cm/s LV PW:         1.00 cm   LV E/e' medial:  9.3 LV IVS:        0.90 cm   LV e' lateral:   6.53 cm/s LVOT diam:     2.00 cm   LV E/e' lateral: 13.7 LV SV:         55 LV SV Index:   32 LVOT Area:     3.14 cm  RIGHT VENTRICLE             IVC RV S prime:     14.30 cm/s  IVC diam: 1.50 cm TAPSE (M-mode): 1.6 cm LEFT ATRIUM             Index        RIGHT ATRIUM           Index LA diam:        3.20 cm 1.87 cm/m   RA Area:     14.70 cm LA Vol (A2C):   46.2 ml 26.93 ml/m  RA Volume:   35.30 ml  20.58 ml/m LA Vol (A4C):   34.9 ml 20.35 ml/m LA Biplane Vol: 40.4 ml 23.55 ml/m  AORTIC VALVE AV Area (Vmax): 2.26 cm AV Vmax:        127.00 cm/s AV Peak Grad:  6.5 mmHg LVOT Vmax:      91.30 cm/s LVOT Vmean:     60.700 cm/s LVOT VTI:       0.174 m  AORTA Ao Root diam: 2.90 cm Ao Asc diam:  3.00 cm MITRAL VALVE               TRICUSPID VALVE MV Area (PHT): 4.31 cm    TR Peak grad:   15.1 mmHg MV Decel Time: 176 msec    TR Vmax:        194.00 cm/s MV E velocity: 89.20 cm/s MV A velocity: 78.20 cm/s  SHUNTS MV E/A ratio:  1.14        Systemic VTI:  0.17 m                            Systemic Diam: 2.00 cm Vina Gull MD Electronically signed by Vina Gull MD Signature Date/Time: 10/26/2023/4:58:25 PM    Final (Updated)        Elgie Butter M.D. [facility] 10/28/2023, 2:39 PM  Available via Epic secure chat 7am-7pm After 7 pm, please refer to night coverage provider listed on amion.

## 2023-10-28 NOTE — Care Management Important Message (Signed)
 Important Message  Patient Details  Name: [name] MRN: [id number] Date of Birth: May 23, 1966   Important Message Given:  Yes - Medicare IM     Janith Melnick 10/28/2023, 12:12 PM

## 2023-10-28 NOTE — Consult Note (Addendum)
 [facility] Consult Follow-up  Patient Name: .[name]  MRN: [id number]  DOB: 11/03/1965  Consult Order details:  Dr. Constantino   Mode of Visit: In person    Psychiatry Consult Evaluation  Service Date: October 28, 2023 LOS:  LOS: 2 days  Chief Complaint I can feel myself spiralling  Primary Psychiatric Diagnoses  Treatment resistant depression 2.  Generalized anxiety disorder with panic attacks 3.  Bulimia   Assessment  [name] is a 58 y.o. female admitted: Medically for 10/25/2023 12:48 PM for N+V. She carries the psychiatric diagnoses of depression, anxiety and remote EtOH abuse and has a past medical history of  DJD, endometriosis, HLD, HTN, migraines, pseudoseizure and seizure.   Her current presentation of several months of worsening of low mood, re-emergence of chornic SI, etc is most consistent with known diagnosis of depression.  Feel seasonal component as wellShe meets criteria for GAD with panic attacks and bulimia (early remission) based on clinical interview.  Current outpatient psychotropic medications include amitriptyline  100 mg, buspirone  15 mg BID and historically she has had a fair response to these medications - usually keep her at mild/mod depression in summer with exacerbations every winter. She was compliant with medications prior to admission as evidenced by pt report, fill hx3. On initial examination, patient was quite pleasant and a good historian. She was unwilling to consider medications that would require monitoring (ie low dose lithium, thyroid  hormone) but very open to other modalities. Please see plan below for detailed recommendations.   On reassessment today, patient presents slightly anxious but pleasant and open to conversation. She continues to express frustration about her Gastrointestinal symptoms. Although there are no acute safety concerns or acute psychotic symptoms noted at this time, however her frustration about her  medical health is negatively impacting her mental health. Patient is tolerating Lamotrigine  well without report of side effect. Insomnia remains a problem with 3 hr of sleep last night. There has been concern for TD with higher dose of Amitriptyline  in the past ( at 300 mg per patient's report), but patient has since been stabilized on Amitriptyline  100 mg at home. She will be restarted on Amitriptyline  to help manage her insomnia. Patient will be seen over the weekend with the possibility of signing off.   Diagnoses:  Active Hospital problems: Principal Problem:   Dilation of biliary tract Active Problems:   Hypokalemia   Acute diarrhea   Seizure-like activity (HCC)   Depression   Dehydration   AKI (acute kidney injury) (HCC)   Tobacco abuse   Nausea & vomiting   Abdominal pain, epigastric    Plan   ## Psychiatric Medication Recommendations:  -- currently on buspirone  15 TID (increased from home dose 15 BID) -- Restart home medication Amitriptyline  100 MG PO QHS  -- Continue vitamin D (last lab low w/ PCP outside [facility] system) -- Continue lamotrigine  25 mg with plan to uptitrate to ?100-150 mg? As outpt.  -- Continue melatonin 10 mg qhs  Discussed proper use of sun lamp in AM  ## Medical Decision Making Capacity: Not specifically addressed in this encounter  ## Further Work-up:  -- vit B12, folate (organic contributers to depression)  -- most recent EKG on 2/4 had QtC of 454 -- Pertinent labwork reviewed earlier this admission includes: TSH wnl, pt's cell phone had EMR of PCP - vit D low at 28  From pt's outside records: Vit D low at 28 mg within last year   ## Disposition:-- There  are no psychiatric contraindications to discharge at this time Have asked SW to refer to PHP or IOP closer to dc  ## Behavioral / Environmental: - No specific recommendations at this time.     ## Safety and Observation Level:  - Based on my clinical evaluation, I estimate the patient to be  at low risk of self harm in the current setting. - At this time, we recommend  routine. This decision is based on my review of the chart including patient's history and current presentation, interview of the patient, mental status examination, and consideration of suicide risk including evaluating suicidal ideation, plan, intent, suicidal or self-harm behaviors, risk factors, and protective factors. This judgment is based on our ability to directly address suicide risk, implement suicide prevention strategies, and develop a safety plan while the patient is in the clinical setting. Please contact our team if there is a concern that risk level has changed.  CSSR Risk Category:C-SSRS RISK CATEGORY: No Risk  Suicide Risk Assessment: Patient has following modifiable risk factors for suicide: under treated depression  and lack of access to outpatient mental health resources, which we are addressing by referring to therapy, changing med regimen . Patient has following non-modifiable or demographic risk factors for suicide: history of suicide attempt, history of self harm behavior, and psychiatric hospitalization Patient has the following protective factors against suicide: Cultural, spiritual, or religious beliefs that discourage suicide, devotion to family, last suicide attempt > 5 years ago  Thank you for this consult request. Recommendations have been communicated to the primary team.  We will continue to follow at this time.   LAWERENCE GUT, NP       History of Present Illness  Relevant Aspects of [facility] Course:  Admitted on 10/25/2023 for GI distress. They endrosed hx depression.   Patient Report:  Feb 7: Patient describes her mood as frustrated because of the ongoing gastrointestinal issues she is experiencing. She expresses disappointment on how her health is been managed by the medicine team. Feels they should have tested her for C-diff. Patient also reports difficulty sleeping and  only got 3 hrs of sleep last night. She reports that she was taking Amitriptyline  100 mg along with Melatonin for the management of insomnia, however she has not been taking that she she arrived to the hospital. She denies SI/HI, stating I don't even have energy for that. Patient is frustrated about about being in the hospital and would like to return home soon.   Psych ROS:  Depression: low mood, low motivation (difficulty getting out of bed most days), usually eating too little w/ binges (on semaglutide), sleep is pretty good - at least 6 hours a night, concentration OK,  chronic suicidal thoughts w/o intent or plan.  Anxiety:  paralyzing fear, social anxiety, has some panic attacks (recently 2-3/week) but background anxiety is main problem.  Mania (lifetime and current): denies Psychosis: (lifetime and current): denies  Collateral information:  None today  ROS - GI distress. Constant diarrhea and abdominal cramps.   Psychiatric and Social History  Psychiatric History:  Information collected from pt, medical record  Prev Dx/Sx: depression, anxiety. Remote hx bulimia (couple weeks ago) Reports long history of fairly treatmetn resistant depression. Last suicidea ttempt (of 10+) in 2016 which was followed by 6 month hospitalizaiton at [facility]. Dx w/ bipolar in 2010s pt states to get access to addl meds denies hx mania.  Current Psych Provider: Mojeed Akintayo q2-3  Home Meds (current):  buspirone  15 TID  Amitriptyline  100 mg at bedtime - apparently had TD on higher dose of this.   Previous Med Trials:  prozac  20 mg Numerous other med trials - basically every antidepressant, most commonly used antipsychotics   On allergy list:  Gabapentin  (abd pain, GI bleed) - pt denies - tried to remove  Therapy: not currently   Prior Psych Hospitalization: numerous. Notably got several sessions of ECT in [place] system in 2020. Stopped working. Approved for TMS at one point but no-showed.  Prior  Self Harm: 10+ lifetime suicide attempts mostly on Tylenol  Prior Violence: no  Family Psych History: aunt bipolar, older sx w/ mood instability, brother who is a monster Family Hx suicide: uncle completed, nephew completed, dad attempted  Social History:  Developmental Hx: deferred Educational Hx: 2 years college major undecided Occupational Hx: disability for depression Legal Hx: deferred Living Situation: with dog  Spiritual Hx: agnostic Access to weapons/lethal means: no, denies large stockpiles meds  Substance History Alcohol: 2 days a year - priorly significant Type of alcohol: margaritas at [employer] Last Drink deferred Number of drinks per day deferred History of alcohol withdrawal seizuresL I think I probably did History of DT's: pt unsure Tobacco: 4 cigarettes/day declined patch Illicit drugs: denies Prescription drug abuse: denied Rehab hx: no  Exam Findings  Physical Exam:  Vital Signs:  Temp:  [97.8 F (36.6 C)-98.5 F (36.9 C)] 98.5 F (36.9 C) (02/07 0700) Pulse Rate:  [73-93] 83 (02/07 0700) Resp:  [16-20] 19 (02/07 0700) BP: (120-136)/(78-89) 120/82 (02/07 0700) SpO2:  [92 %-100 %] 99 % (02/07 0700) Blood pressure 120/82, pulse 83, temperature 98.5 F (36.9 C), temperature source Oral, resp. rate 19, height 5' 2 (1.575 m), weight 70.3 kg, SpO2 99%. Body mass index is 28.35 kg/m.  Physical Exam HENT:     Head: Normocephalic.  Eyes:     Conjunctiva/sclera: Conjunctivae normal.  Pulmonary:     Effort: Pulmonary effort is normal.  Neurological:     Mental Status: She is alert and oriented to person, place, and time.     Mental Status Exam: General Appearance: Fairly Groomed - hair brushed, earrings in.   Orientation:  Full (Time, Place, and Person)  Memory:  Immediate;   Good Recent;   Good Remote;   Good  Concentration:  Concentration: Good  Recall:  Good  Attention  Good  Eye Contact:  Good  Speech:  Clear and Coherent  Language:   Good  Volume:  Normal  Mood: Frustrated  Affect:  Congruent with mood  Thought Process:  Coherent  Thought Content:  Logical and devoid of delusions/paranoia  Suicidal Thoughts:  No  Homicidal Thoughts:  No  Judgement:  Fair  Insight:  Fair  Psychomotor Activity:  Normal  Akathisia:  No  Fund of Knowledge:  Good   No perceptural disturbances endorsed/observed  Assets:  Communication Skills Desire for Improvement Financial Resources/Insurance Housing Leisure Time  Cognition:  WNL  ADL's:  not formally assessed  AIMS (if indicated):        Other History   These have been pulled in through the EMR, reviewed, and updated if appropriate.  Family History:  The patient's family history includes Cancer in an other family member; Heart attack in her father; Heart failure in her father; Hyperlipidemia in her half-sister, half-sister, and mother; Hypertension in her half-sister, half-sister, and mother.  Medical History: Past Medical History:  Diagnosis Date   Anorexia    Anxiety    Depression  DJD (degenerative joint disease) of cervical spine    Endometriosis    ETOH abuse    sober 3 1/2 years   Hyperlipidemia    Hypertension    Migraines    PICC (peripherally inserted central catheter) in place    rt neck   Psychiatric pseudoseizure    Seizures (HCC)     Surgical History: Past Surgical History:  Procedure Laterality Date   ABDOMINAL HYSTERECTOMY     ABDOMINAL SURGERY     APPENDECTOMY     CARPAL TUNNEL RELEASE     2010   CHOLECYSTECTOMY     ESOPHAGOGASTRODUODENOSCOPY N/A 10/06/2014   Procedure: ESOPHAGOGASTRODUODENOSCOPY (EGD);  Surgeon: Lamar JONETTA Aho, MD;  Location: [facility] ENDOSCOPY;  Service: Endoscopy;  Laterality: N/A;   KNEE SURGERY     laproscopy     NASAL SINUS SURGERY     PERICARDIOCENTESIS N/A 03/16/2021   Procedure: PERICARDIOCENTESIS;  Surgeon: Elmira Newman PARAS, MD;  Location: [facility] INVASIVE CV LAB;  Service: Cardiovascular;  Laterality: N/A;    SPINAL CORD STIMULATOR REMOVAL N/A 12/18/2021   Procedure: Removal of spinal cord stimulator;  Surgeon: Darlis Deatrice RAMAN, MD;  Location: [facility] OR;  Service: Neurosurgery;  Laterality: N/A;  RM 19   VIDEO ASSISTED THORACOSCOPY Right 03/18/2021   Procedure: VIDEO ASSISTED THORACOSCOPY FOR PERICARDIAL WINDOW;  Surgeon: Shyrl Linnie KIDD, MD;  Location: [facility] OR;  Service: Thoracic;  Laterality: Right;  pericardial window     Medications:   Current Facility-Administered Medications:    amitriptyline  (ELAVIL ) tablet 100 mg, 100 mg, Oral, QHS, Chonda Baney, NP   amLODipine  (NORVASC ) tablet 5 mg, 5 mg, Oral, Daily, Akula, Vijaya, MD, 5 mg at 10/28/23 0954   busPIRone  (BUSPAR ) tablet 15 mg, 15 mg, Oral, TID, Cinderella, Margaret A, 15 mg at 10/28/23 9046   Chlorhexidine  Gluconate Cloth 2 % PADS 6 each, 6 each, Topical, Daily, Akula, Vijaya, MD, 6 each at 10/28/23 0955   cholecalciferol  (VITAMIN D3) 10 MCG (400 UNIT) tablet 400 Units, 400 Units, Oral, Daily, Cinderella, Margaret A, 400 Units at 10/28/23 9045   dicyclomine  (BENTYL ) tablet 20 mg, 20 mg, Oral, TID AC & HS, McGreal, Inocente HERO, MD   dicyclomine  (BENTYL ) tablet 40 mg, 40 mg, Oral, QID PRN, Akula, Vijaya, MD   fentaNYL  (SUBLIMAZE ) injection 12.5-50 mcg, 12.5-50 mcg, Intravenous, Q2H PRN, Doutova, Anastassia, MD, 50 mcg at 10/28/23 9045   gabapentin  (NEURONTIN ) capsule 300 mg, 300 mg, Oral, QHS, Doutova, Anastassia, MD, 300 mg at 10/27/23 2200   lamoTRIgine  (LAMICTAL ) tablet 25 mg, 25 mg, Oral, Daily, Cinderella, Margaret A, 25 mg at 10/28/23 0954   melatonin tablet 10 mg, 10 mg, Oral, QHS, Akula, Vijaya, MD, 10 mg at 10/27/23 2200   methocarbamol  (ROBAXIN ) tablet 750 mg, 750 mg, Oral, Q8H PRN, Akula, Vijaya, MD, 750 mg at 10/28/23 0954   ondansetron  (ZOFRAN ) tablet 4 mg, 4 mg, Oral, Q6H PRN, 4 mg at 10/27/23 1420 **OR** ondansetron  (ZOFRAN ) injection 4 mg, 4 mg, Intravenous, Q6H PRN, Doutova, Anastassia, MD, 4 mg at 10/27/23 2159   pantoprazole   (PROTONIX ) EC tablet 40 mg, 40 mg, Oral, Q0600, Akula, Vijaya, MD, 40 mg at 10/28/23 0509   rosuvastatin  (CRESTOR ) tablet 20 mg, 20 mg, Oral, QHS, Doutova, Anastassia, MD, 20 mg at 10/27/23 2201   sodium chloride  flush (NS) 0.9 % injection 10-40 mL, 10-40 mL, Intracatheter, Q12H, Akula, Vijaya, MD, 10 mL at 10/28/23 0956   sodium chloride  flush (NS) 0.9 % injection 10-40 mL, 10-40 mL, Intracatheter, PRN, Akula, Vijaya, MD  tapentadol  (NUCYNTA ) 12 hr tablet 50 mg, 50 mg, Oral, Q12H, Doutova, Anastassia, MD, 50 mg at 10/28/23 0954   tapentadol  (NUCYNTA ) tablet 50 mg, 50 mg, Oral, TID PRN, Akula, Vijaya, MD, 50 mg at 10/27/23 1420  Allergies: Allergies  Allergen Reactions   Aspirin  Other (See Comments)    GI bleed.   Coconut (Cocos Nucifera) Rash   Doxycycline Anaphylaxis and Other (See Comments)    Joint damage also   Gabapentin      Other reaction(s): Abdominal Pain, GI Bleed   Imitrex [Sumatriptan Base] Other (See Comments)    Severe hypertension, all triptans   Iodinated Contrast Media Anaphylaxis   Lactose Diarrhea    Other reaction(s): GI Upset (intolerance)   Lidocaine  Anaphylaxis    Pt states she does well with Marcaine /Bupivicaine without problem   Prednisone  Other (See Comments)    She goes crazy   Sulfa Antibiotics Anaphylaxis   Sulfasalazine Anaphylaxis   Sumatriptan Other (See Comments)    Felt like she was having a stroke, heart rate and blood pressure went through the roof   Levofloxacin Other (See Comments)    Joints hurt   Amoxicillin-Pot Clavulanate Other (See Comments)    Pt can't remember what type of reaction she had, but states her pharmacy has it listed in their allergy list.  12/18/21 Pt has tolerated Ancef  in the past.   Caffeine -Sodium Benzoate     Other reaction(s): Dizziness (intolerance)   Other Other (See Comments)    Artificial sugar - migraine   Prochlorperazine  Other (See Comments)    Tonic/clonic seizure   Latex Rash   Levofloxacin Rash and  Other (See Comments)    Joints hurt   Metrizamide Other (See Comments)   Nsaids Rash and Other (See Comments)    GI bleed   Tolmetin Rash    Graeme Menees, NP

## 2023-10-28 NOTE — Plan of Care (Signed)
  Problem: Clinical Measurements: Goal: Will remain free from infection Outcome: Progressing   Problem: Clinical Measurements: Goal: Respiratory complications will improve Outcome: Progressing   Problem: Clinical Measurements: Goal: Cardiovascular complication will be avoided Outcome: Progressing   Problem: Nutrition: Goal: Adequate nutrition will be maintained Outcome: Progressing   Problem: Coping: Goal: Level of anxiety will decrease Outcome: Progressing   Problem: Elimination: Goal: Will not experience complications related to bowel motility Outcome: Progressing   Problem: Elimination: Goal: Will not experience complications related to urinary retention Outcome: Progressing   Problem: Pain Managment: Goal: General experience of comfort will improve and/or be controlled Outcome: Progressing   Problem: Safety: Goal: Ability to remain free from injury will improve Outcome: Progressing   Problem: Skin Integrity: Goal: Risk for impaired skin integrity will decrease Outcome: Progressing

## 2023-10-28 NOTE — Progress Notes (Signed)
 Daily Progress Note  DOA: 10/25/2023 Hospital Day: 4  Chief Complaint: N/V/D/ upper abdominal pain, IBS ASSESSMENT    Brief Narrative:  [name] is a 58 y.o. year old female with a history of duodenal ulcer ( 2016), chronic pain with opioid dependence, IBS, HLD, HTN, tobacco abuse, pseudoseizure, migraines . Admitted with mid upper abdominal pain, nausea, vomiting and diarrhea. Incidental findings of biliary duct dilation. GI saw in consult 14/5    58 yo female admitted with mid upper abdominal pain / nausea / vomiting, acute on chronic diarrhea with volume depletion.  Possibly had gastroenteritis which has exacerbating her underlying IBS-D.  GI path panel negative. C-diff negative   Hypokalemia Improving, K+ 3.4 today   Chronic IBS-D Symptoms gets worse depending on season. Manages with imodium  and bentyl    Biliary duct dilation but normal LFTs. Etiology ? Probably incidental finding and unrelated to GI symptoms  MRCP demonstrating post cholecystectomy,  intra / extrahepatic biliary duct dilation and mild PD dilation without evidence for evidence for choledocholithiasis or obstructing lesion at the ampulla or a pancreas lesion. LFTs remain normal   Left psoas muscle abnormality on MRI -   3-4 mm focus of hyperenhancement identified in the left psoas muscle, potentially a vascular malformation or nerve sheath tumor, but likely benign    GERD  Remote history of GI bleed  / duodenal ulcers likely 2/2 to NSAIDS   [facility] of colon cancer in two paternal uncles in their 76's.     Principal Problem:   Dilation of biliary tract Active Problems:   Hypokalemia   Acute diarrhea   Seizure-like activity (HCC)   Depression   Dehydration   AKI (acute kidney injury) (HCC)   Tobacco abuse   Nausea & vomiting   Abdominal pain, epigastric   PLAN   --Await fecal calprotectin --Changing Bentyl  to scheduled dose of 20 mg TID. Will d/c prn dose  Subjective    Still having upper  abdominal discomfort and nausea with minimal PO intake. Still having loose BMs   Objective    Recent Labs    10/25/23 1404 10/26/23 0023 10/26/23 0654 10/27/23 0500  WBC 14.3*  --  8.3 8.2  HGB 18.6* 15.0 15.2* 14.5  HCT 52.1* 44.0 43.0 40.9  PLT 400  --  321 316   BMET Recent Labs    10/25/23 1404 10/26/23 0023 10/26/23 0654 10/26/23 2102 10/27/23 0500  NA 135 138 138  --  138  K 3.3* 3.0* 2.9* 2.8* 3.3*  CL 97*  --  103  --  100  CO2 22  --  26  --  28  GLUCOSE 100*  --  88  --  85  BUN 10  --  11  --  5*  CREATININE 1.10*  --  0.90  --  0.68  CALCIUM  10.5*  --  9.0  --  9.0   LFT Recent Labs    10/27/23 0500  PROT 6.1*  ALBUMIN  3.5  AST 20  ALT 21  ALKPHOS 60  BILITOT 0.7   PT/INR Recent Labs    10/25/23 1404  LABPROT 12.4  INR 0.9     Imaging:  MR 3D Recon At Scanner CLINICAL DATA:  Right upper quadrant abdominal pain. Biliary disease suspected.  EXAM: MRI ABDOMEN WITHOUT AND WITH CONTRAST (INCLUDING MRCP)  TECHNIQUE: Multiplanar multisequence MR imaging of the abdomen was performed both before and after the administration of intravenous contrast. Heavily T2-weighted images of the  biliary and pancreatic ducts were obtained, and three-dimensional MRCP images were rendered by post processing.  CONTRAST:  7mL GADAVIST  GADOBUTROL  1 MMOL/ML IV SOLN  COMPARISON:  CT scan from earlier same day.  FINDINGS: Lower chest: Lower chest unremarkable.  Hepatobiliary: Heterogeneous perfusion of liver parenchyma on early postcontrast imaging without a discrete mass lesion evident. Loss of signal intensity in the liver parenchyma on out of phase T1 imaging is compatible with steatosis. As noted on CT, there is marked intrahepatic biliary duct dilatation with dilatation of the extrahepatic biliary tree. Common duct measures on the order of 16 mm diameter. Common bile duct in the head of the pancreas measures 13 mm diameter with common bile duct  dilatation extending into the ampulla. MRCP imaging shows no evidence for choledocholithiasis. No obstructing lesion is discernible at the ampulla. No wall thickening or hyperenhancement associated with the extrahepatic biliary system.  Pancreas: As noted on CT imaging, there is mild distention of the main pancreatic duct through the head of the pancreas. No evidence for mass lesion in the pancreas no evidence for restricted diffusion in the head of the pancreas.  Spleen:  No splenomegaly. No suspicious focal mass lesion.  Adrenals/Urinary Tract: Right adrenal gland unremarkable. 17 mm left adrenal nodule shows loss of signal intensity on out of phase T1 imaging consistent with adenoma. No hydronephrosis or enhancing renal mass lesion.  Stomach/Bowel: Stomach is unremarkable. No gastric wall thickening. No evidence of outlet obstruction. Duodenum is normally positioned as is the ligament of Treitz. No small bowel or colonic dilatation within the visualized abdomen.  Vascular/Lymphatic: No abdominal aortic aneurysm. No abdominal lymphadenopathy  Other:  No intraperitoneal free fluid.  Musculoskeletal: No focal suspicious marrow enhancement within the visualized bony anatomy. 3 mm focus of T2 hyperintensity with hyperenhancement is identified in the left psoas muscle (see postcontrast image 71 of series 1502), nonspecific but most likely benign.  IMPRESSION: 1. Marked intrahepatic and extrahepatic biliary duct dilatation with dilatation of the main pancreatic duct through the head of the pancreas. No evidence for choledocholithiasis or obstructing lesion at the ampulla. No wall thickening or hyperenhancement associated with the extrahepatic biliary system. ERCP may prove helpful to further evaluate. 2. 3-4 mm focus of hyperenhancement identified in the left psoas muscle, potentially a vascular malformation or nerve sheath tumor, but likely benign. Follow-up MRI in 3-6 months  could be used to ensure stability as clinically warranted. 3. Hepatic steatosis. 4. 17 mm left adrenal adenoma.  Electronically Signed   By: Camellia Candle M.D.   On: 10/27/2023 07:33     Scheduled inpatient medications:   amitriptyline   100 mg Oral QHS   amLODipine   5 mg Oral Daily   busPIRone   15 mg Oral TID   Chlorhexidine  Gluconate Cloth  6 each Topical Daily   cholecalciferol   400 Units Oral Daily   dicyclomine   20 mg Oral TID AC & HS   gabapentin   300 mg Oral QHS   lamoTRIgine   25 mg Oral Daily   melatonin  10 mg Oral QHS   pantoprazole   40 mg Oral Q0600   rosuvastatin   20 mg Oral QHS   sodium chloride  flush  10-40 mL Intracatheter Q12H   tapentadol   50 mg Oral Q12H   Continuous inpatient infusions:  PRN inpatient medications: dicyclomine , fentaNYL  (SUBLIMAZE ) injection, methocarbamol , ondansetron  **OR** ondansetron  (ZOFRAN ) IV, sodium chloride  flush, tapentadol   Vital signs in last 24 hours: Temp:  [97.8 F (36.6 C)-98.5 F (36.9 C)] 98.5 F (36.9 C) (  02/07 0700) Pulse Rate:  [73-93] 83 (02/07 0700) Resp:  [16-20] 19 (02/07 0700) BP: (120-136)/(78-89) 120/82 (02/07 0700) SpO2:  [92 %-100 %] 99 % (02/07 0700) Last BM Date : 10/27/23  Intake/Output Summary (Last 24 hours) at 10/28/2023 1213 Last data filed at 10/28/2023 0510 Gross per 24 hour  Intake 820 ml  Output --  Net 820 ml    Intake/Output from previous day: 02/06 0701 - 02/07 0700 In: 820 [P.O.:820] Out: -  Intake/Output this shift: No intake/output data recorded.   Physical Exam:  General: in NAD Heart:  Regular rate and rhythm.  Pulmonary: Normal respiratory effort Abdomen: Soft, nondistended, nontender. Normal bowel sounds. Extremities: No lower extremity edema  Neurologic: Alert and oriented Psych: Pleasant. Cooperative. Emotional today     LOS: 2 days   Vina Dasen ,NP 10/28/2023, 12:13 PM

## 2023-10-28 NOTE — Progress Notes (Signed)
 Mobility Specialist Progress Note:   10/28/23 1036  Mobility  Activity Ambulated independently in hallway  Level of Assistance Independent after set-up  Assistive Device None  Distance Ambulated (ft) 550 ft  Activity Response Tolerated well  Mobility Referral Yes  Mobility visit 1 Mobility  Mobility Specialist Start Time (ACUTE ONLY) 1015  Mobility Specialist Stop Time (ACUTE ONLY) 1025  Mobility Specialist Time Calculation (min) (ACUTE ONLY) 10 min   Pt received in bed, agreeable to mobility. C/o slight irritability d/t diet, otherwise asx throughout. VSS. No physical assistance needed during ambulation. Pt returned to bed with all needs met.   Brown Husband  Mobility Specialist Please contact via [employer] office at [phone]

## 2023-10-29 ENCOUNTER — Other Ambulatory Visit (HOSPITAL_COMMUNITY): Payer: Self-pay

## 2023-10-29 DIAGNOSIS — E876 Hypokalemia: Secondary | ICD-10-CM | POA: Diagnosis not present

## 2023-10-29 DIAGNOSIS — K5900 Constipation, unspecified: Secondary | ICD-10-CM

## 2023-10-29 DIAGNOSIS — K838 Other specified diseases of biliary tract: Secondary | ICD-10-CM | POA: Diagnosis not present

## 2023-10-29 DIAGNOSIS — R112 Nausea with vomiting, unspecified: Secondary | ICD-10-CM | POA: Diagnosis not present

## 2023-10-29 DIAGNOSIS — R1013 Epigastric pain: Secondary | ICD-10-CM | POA: Diagnosis not present

## 2023-10-29 DIAGNOSIS — R569 Unspecified convulsions: Secondary | ICD-10-CM | POA: Diagnosis not present

## 2023-10-29 DIAGNOSIS — N179 Acute kidney failure, unspecified: Secondary | ICD-10-CM | POA: Diagnosis not present

## 2023-10-29 MED ORDER — PANTOPRAZOLE SODIUM 40 MG PO TBEC
40.0000 mg | DELAYED_RELEASE_TABLET | Freq: Every day | ORAL | 2 refills | Status: DC
  Filled 2023-10-29: qty 30, 30d supply, fill #0

## 2023-10-29 MED ORDER — ONDANSETRON HCL 8 MG PO TABS
8.0000 mg | ORAL_TABLET | Freq: Three times a day (TID) | ORAL | 1 refills | Status: AC | PRN
  Filled 2023-10-29: qty 20, 7d supply, fill #0

## 2023-10-29 MED ORDER — CYANOCOBALAMIN 500 MCG PO TABS
500.0000 ug | ORAL_TABLET | Freq: Every day | ORAL | 2 refills | Status: AC
  Filled 2023-10-29: qty 30, 30d supply, fill #0

## 2023-10-29 MED ORDER — METOCLOPRAMIDE HCL 5 MG PO TABS
5.0000 mg | ORAL_TABLET | Freq: Three times a day (TID) | ORAL | Status: DC
  Administered 2023-10-29 – 2023-10-31 (×7): 5 mg via ORAL
  Filled 2023-10-29 (×7): qty 1

## 2023-10-29 MED ORDER — BUSPIRONE HCL 15 MG PO TABS
15.0000 mg | ORAL_TABLET | Freq: Three times a day (TID) | ORAL | 2 refills | Status: DC
  Filled 2023-10-29: qty 90, 30d supply, fill #0

## 2023-10-29 MED ORDER — GABAPENTIN 300 MG PO CAPS
300.0000 mg | ORAL_CAPSULE | Freq: Every day | ORAL | 2 refills | Status: DC
  Filled 2023-10-29: qty 30, 30d supply, fill #0

## 2023-10-29 MED ORDER — SIMETHICONE 80 MG PO CHEW
80.0000 mg | CHEWABLE_TABLET | Freq: Four times a day (QID) | ORAL | 0 refills | Status: AC | PRN
  Filled 2023-10-29: qty 30, 8d supply, fill #0

## 2023-10-29 MED ORDER — LAMOTRIGINE 25 MG PO TABS
25.0000 mg | ORAL_TABLET | Freq: Every day | ORAL | 2 refills | Status: DC
  Filled 2023-10-29: qty 30, 30d supply, fill #0

## 2023-10-29 MED ORDER — CHOLECALCIFEROL 10 MCG (400 UNIT) PO TABS
400.0000 [IU] | ORAL_TABLET | Freq: Every day | ORAL | 0 refills | Status: DC
  Filled 2023-10-29: qty 30, 30d supply, fill #0

## 2023-10-29 MED ORDER — POTASSIUM CHLORIDE CRYS ER 20 MEQ PO TBCR
40.0000 meq | EXTENDED_RELEASE_TABLET | Freq: Once | ORAL | Status: AC
  Administered 2023-10-29: 40 meq via ORAL
  Filled 2023-10-29: qty 2

## 2023-10-29 MED ORDER — SODIUM CHLORIDE 0.9 % IV SOLN: INTRAVENOUS | Status: AC | PRN

## 2023-10-29 MED ORDER — HYDROCORTISONE 1 % EX CREA
1.0000 | TOPICAL_CREAM | Freq: Three times a day (TID) | CUTANEOUS | Status: DC | PRN
  Administered 2023-10-29: 1 via TOPICAL
  Filled 2023-10-29: qty 28

## 2023-10-29 MED ORDER — DICYCLOMINE HCL 10 MG PO CAPS
10.0000 mg | ORAL_CAPSULE | Freq: Three times a day (TID) | ORAL | Status: DC
  Administered 2023-10-29: 10 mg via ORAL
  Filled 2023-10-29 (×7): qty 1

## 2023-10-29 MED ORDER — METHOCARBAMOL 750 MG PO TABS
750.0000 mg | ORAL_TABLET | Freq: Three times a day (TID) | ORAL | 2 refills | Status: DC | PRN
  Filled 2023-10-29: qty 90, 30d supply, fill #0

## 2023-10-29 NOTE — Progress Notes (Signed)
 Daily Progress Note  DOA: 10/25/2023 Hospital Day: 5  Chief Complaint: N/V/D/ upper abdominal pain, IBS ASSESSMENT    Brief Narrative:  Kirstyn Lean is a 58 y.o. year old female with a history of duodenal ulcer ( 2016), chronic pain with opioid dependence, IBS, HLD, HTN, tobacco abuse, pseudoseizure, migraines . Admitted with mid upper abdominal pain, nausea, vomiting and diarrhea. Incidental findings of biliary duct dilation. GI saw in consult 23/5    58 yo female admitted with mid upper abdominal pain / nausea / vomiting, acute on chronic diarrhea with volume depletion.  Possibly had gastroenteritis which has exacerbating her underlying IBS-D.  GI path panel negative. C-diff negative.  Reports the diarrhea has resolved on 10/29/2023.    In light of her ongoing abdominal pain and history of duodenal ulcers discussed possibility of EGD which she is interested in   Hypokalemia Improving, K+ 3.4 yesterday - no repeat today   Chronic IBS-D Symptoms gets worse depending on season. Manages with imodium  and bentyl .  Bentyl  20 mg p.o. 4 times daily started 10/28/2023 -reports constipation and vomiting today.   Biliary duct dilation but normal LFTs. Etiology ? Probably incidental finding and unrelated to GI symptoms  MRCP demonstrating post cholecystectomy,  intra / extrahepatic biliary duct dilation and mild PD dilation without evidence for evidence for choledocholithiasis or obstructing lesion at the ampulla or a pancreas lesion. LFTs remain normal   Left psoas muscle abnormality on MRI -   3-4 mm focus of hyperenhancement identified in the left psoas muscle, potentially a vascular malformation or nerve sheath tumor, but likely benign    GERD  Remote history of GI bleed  / duodenal ulcers likely 2/2 to NSAIDS   [facility] of colon cancer in two paternal uncles in their 71's.     Principal Problem:   Dilation of biliary tract Active Problems:   Hypokalemia   Acute diarrhea   Seizure-like  activity (HCC)   Depression   Dehydration   AKI (acute kidney injury) (HCC)   Tobacco abuse   Nausea & vomiting   Abdominal pain, epigastric   PLAN   --Await fecal calprotectin --Changing Bentyl  from 20 mg p.o. 4 times daily to 10 mg p.o. 4 times daily --Start Reglan  5 mg p.o. 4 times daily --EGD 10/30/2023  Subjective    -- Continues to experience ongoing mid epigastric abdominal discomfort -- Diarrhea has abated -reports some constipation -- Has had nausea and vomiting today and reports difficulty with oral intake  Objective    Recent Labs    10/27/23 0500  WBC 8.2  HGB 14.5  HCT 40.9  PLT 316   BMET Recent Labs    10/26/23 2102 10/27/23 0500 10/28/23 0908  NA  --  138 140  K 2.8* 3.3* 3.4*  CL  --  100 101  CO2  --  28 29  GLUCOSE  --  85 78  BUN  --  5* 6  CREATININE  --  0.68 0.77  CALCIUM   --  9.0 9.5   LFT Recent Labs    10/27/23 0500  PROT 6.1*  ALBUMIN  3.5  AST 20  ALT 21  ALKPHOS 60  BILITOT 0.7   PT/INR No results for input(s): LABPROT, INR in the last 72 hours.    Imaging:  MR 3D Recon At Scanner CLINICAL DATA:  Right upper quadrant abdominal pain. Biliary disease suspected.  EXAM: MRI ABDOMEN WITHOUT AND WITH CONTRAST (INCLUDING MRCP)  TECHNIQUE: Multiplanar multisequence MR  imaging of the abdomen was performed both before and after the administration of intravenous contrast. Heavily T2-weighted images of the biliary and pancreatic ducts were obtained, and three-dimensional MRCP images were rendered by post processing.  CONTRAST:  7mL GADAVIST  GADOBUTROL  1 MMOL/ML IV SOLN  COMPARISON:  CT scan from earlier same day.  FINDINGS: Lower chest: Lower chest unremarkable.  Hepatobiliary: Heterogeneous perfusion of liver parenchyma on early postcontrast imaging without a discrete mass lesion evident. Loss of signal intensity in the liver parenchyma on out of phase T1 imaging is compatible with steatosis. As noted on CT,  there is marked intrahepatic biliary duct dilatation with dilatation of the extrahepatic biliary tree. Common duct measures on the order of 16 mm diameter. Common bile duct in the head of the pancreas measures 13 mm diameter with common bile duct dilatation extending into the ampulla. MRCP imaging shows no evidence for choledocholithiasis. No obstructing lesion is discernible at the ampulla. No wall thickening or hyperenhancement associated with the extrahepatic biliary system.  Pancreas: As noted on CT imaging, there is mild distention of the main pancreatic duct through the head of the pancreas. No evidence for mass lesion in the pancreas no evidence for restricted diffusion in the head of the pancreas.  Spleen:  No splenomegaly. No suspicious focal mass lesion.  Adrenals/Urinary Tract: Right adrenal gland unremarkable. 17 mm left adrenal nodule shows loss of signal intensity on out of phase T1 imaging consistent with adenoma. No hydronephrosis or enhancing renal mass lesion.  Stomach/Bowel: Stomach is unremarkable. No gastric wall thickening. No evidence of outlet obstruction. Duodenum is normally positioned as is the ligament of Treitz. No small bowel or colonic dilatation within the visualized abdomen.  Vascular/Lymphatic: No abdominal aortic aneurysm. No abdominal lymphadenopathy  Other:  No intraperitoneal free fluid.  Musculoskeletal: No focal suspicious marrow enhancement within the visualized bony anatomy. 3 mm focus of T2 hyperintensity with hyperenhancement is identified in the left psoas muscle (see postcontrast image 71 of series 1502), nonspecific but most likely benign.  IMPRESSION: 1. Marked intrahepatic and extrahepatic biliary duct dilatation with dilatation of the main pancreatic duct through the head of the pancreas. No evidence for choledocholithiasis or obstructing lesion at the ampulla. No wall thickening or hyperenhancement associated with the  extrahepatic biliary system. ERCP may prove helpful to further evaluate. 2. 3-4 mm focus of hyperenhancement identified in the left psoas muscle, potentially a vascular malformation or nerve sheath tumor, but likely benign. Follow-up MRI in 3-6 months could be used to ensure stability as clinically warranted. 3. Hepatic steatosis. 4. 17 mm left adrenal adenoma.  Electronically Signed   By: Camellia Candle M.D.   On: 10/27/2023 07:33     Scheduled inpatient medications:   amitriptyline   100 mg Oral QHS   amLODipine   5 mg Oral Daily   busPIRone   15 mg Oral TID   Chlorhexidine  Gluconate Cloth  6 each Topical Daily   cholecalciferol   400 Units Oral Daily   dicyclomine   10 mg Oral TID AC & HS   gabapentin   300 mg Oral QHS   lamoTRIgine   25 mg Oral Daily   melatonin  10 mg Oral QHS   metoCLOPramide   5 mg Oral TID AC   pantoprazole   40 mg Oral Q0600   rosuvastatin   20 mg Oral QHS   sodium chloride  flush  10-40 mL Intracatheter Q12H   tapentadol   50 mg Oral Q12H   vitamin B-12  500 mcg Oral Daily   Continuous inpatient infusions:  sodium chloride  10 mL/hr at 10/29/23 1446   PRN inpatient medications: sodium chloride , fentaNYL  (SUBLIMAZE ) injection, methocarbamol , ondansetron  **OR** ondansetron  (ZOFRAN ) IV, simethicone , sodium chloride  flush, tapentadol   Vital signs in last 24 hours: Temp:  [98 F (36.7 C)-99.6 F (37.6 C)] 98.1 F (36.7 C) (02/08 0845) Pulse Rate:  [78-99] 82 (02/08 0845) Resp:  [16-18] 17 (02/08 0845) BP: (115-127)/(83-85) 115/84 (02/08 0845) SpO2:  [94 %-97 %] 97 % (02/08 0845) Last BM Date : 10/28/23 No intake or output data in the 24 hours ending 10/29/23 1451   Intake/Output from previous day: 02/07 0701 - 02/08 0700 In: 480 [P.O.:480] Out: -  Intake/Output this shift: No intake/output data recorded.   Physical Exam:  General: in NAD Heart:  Regular rate and rhythm.  Pulmonary: Normal respiratory effort Abdomen: Soft, nondistended, nontender.  Normal bowel sounds. Extremities: No lower extremity edema  Neurologic: Alert and oriented Psych: Pleasant. Cooperative. Emotional today     LOS: 3 days   Inocente CHRISTELLA Hausen ,NP 10/29/2023, 2:51 PM

## 2023-10-29 NOTE — Progress Notes (Signed)
 [facility]                                                                               Narda Fundora, is a 58 y.o. female, DOB - 1966-03-07, FMW:[id number] Admit date - 10/25/2023    Outpatient Primary MD for the patient is Rosalea Rosina SAILOR, PA  LOS - 3  days    Brief summary    [name] is a 58 y.o. female with medical history significant of   migraine,depression, HLD, PUD, IBS, hypertension, ongoing tobacco use, pseudoseizures, admitted for nausea, vomiting , diarrhea and abdominal pain.  Assessment & Plan    Assessment and Plan:   Nausea, vomiting, diarrhea and abdominal pain in the setting of IBS As per the patient, she has chronic diarrhea from IBS.  She also reports her diarrhea worsens with seasonal worsening of depression.  Improvement in her nausea, vomiting and diarrhea. GI on board, plan for EGD in am.  MRCP done, showing dilated intra and extrahepatic biliary ducts.  GI consulted , suggested the dilated intra and extrahepatic ducts probably a post cholecystectomy state and chronic narcotic use.  Starting her on clears and currently on regular diet.  GI panel is negative   H/o PUD, duodenal ulcer in the past Continue with PPI.    IBS Recommend outpatient follow up with GI.    Hypokalemia:  Replaced , repeat levels are low. Replaced this am.  Repeat BMP in the morning.    Hypertension Optimal BP parameters.  Continue with norvasc  5 mg daily.    Hyperlipidemia Resume crestor .    Anxiety and depression.  Psychiatry consulted.  No suicidal ideations.  Vit b12 204 , low normal. Vitamin b12 supplementation will be added.  Folate wnl.  Continue with Buspar  15 mg TID, LAMICTAL   25 mg daily, amitriptyline  added today.  No changes in meds.    Pseudoseizures:  Continue with gabapentin  300 mg daily at bedtime.     Estimated body mass index is 28.35 kg/m as calculated from the following:   Height as of this encounter: 5' 2 (1.575  m).   Weight as of this encounter: 70.3 kg.  Code Status: full code.  DVT Prophylaxis:  SCDs Start: 10/26/23 0307   Level of Care: Level of care: Med-Surg Family Communication: none at bedside.   Disposition Plan:     Remains inpatient appropriate:  ongoing diarrhea.   Procedures:  MRCP  Consultants:   Psychiatry gastroenterology  Antimicrobials:   Anti-infectives (From admission, onward)    None        Medications  Scheduled Meds:  amitriptyline   100 mg Oral QHS   amLODipine   5 mg Oral Daily   busPIRone   15 mg Oral TID   Chlorhexidine  Gluconate Cloth  6 each Topical Daily   cholecalciferol   400 Units Oral Daily   dicyclomine   10 mg Oral TID AC & HS   gabapentin   300 mg Oral QHS   lamoTRIgine   25 mg Oral Daily   melatonin  10 mg Oral QHS   metoCLOPramide   5 mg Oral TID AC   pantoprazole   40 mg Oral Q0600   rosuvastatin   20 mg Oral  QHS   sodium chloride  flush  10-40 mL Intracatheter Q12H   tapentadol   50 mg Oral Q12H   vitamin B-12  500 mcg Oral Daily   Continuous Infusions:   PRN Meds:.fentaNYL  (SUBLIMAZE ) injection, methocarbamol , ondansetron  **OR** ondansetron  (ZOFRAN ) IV, simethicone , sodium chloride  flush, tapentadol     Subjective:   [name] was seen and examined today. Reports feeling much better, but still has nausea. No vomiting.   Objective:   Vitals:   10/28/23 1443 10/28/23 2020 10/29/23 0356 10/29/23 0845  BP: 136/84 127/83 116/85 115/84  Pulse: 79 78 99 82  Resp: 18 16 18 17   Temp: 98.2 F (36.8 C) 98 F (36.7 C) 99.6 F (37.6 C) 98.1 F (36.7 C)  TempSrc:   Oral Oral  SpO2: 100% 94% 95% 97%  Weight:      Height:       No intake or output data in the 24 hours ending 10/29/23 1316  Filed Weights   10/25/23 1254 10/26/23 0315  Weight: 73 kg 70.3 kg     Exam General exam: Appears calm and comfortable  Respiratory system: Clear to auscultation. Respiratory effort normal. Cardiovascular system: S1 & S2 heard, RRR. No  JVD,  Gastrointestinal system: Abdomen is nondistended, soft and non tender Central nervous system: Alert and oriented.  Extremities: no pedal edema.  Skin: No rashes,  Psychiatry:  Mood & affect appropriate.      Data Reviewed:  I have personally reviewed following labs and imaging studies   CBC Lab Results  Component Value Date   WBC 8.2 10/27/2023   RBC 4.52 10/27/2023   HGB 14.5 10/27/2023   HCT 40.9 10/27/2023   MCV 90.5 10/27/2023   MCH 32.1 10/27/2023   PLT 316 10/27/2023   MCHC 35.5 10/27/2023   RDW 13.0 10/27/2023   LYMPHSABS 3.1 10/27/2023   MONOABS 0.7 10/27/2023   EOSABS 0.2 10/27/2023   BASOSABS 0.0 10/27/2023     Last metabolic panel Lab Results  Component Value Date   NA 140 10/28/2023   K 3.4 (L) 10/28/2023   CL 101 10/28/2023   CO2 29 10/28/2023   BUN 6 10/28/2023   CREATININE 0.77 10/28/2023   GLUCOSE 78 10/28/2023   GFRNONAA >60 10/28/2023   GFRAA >60 02/05/2019   CALCIUM  9.5 10/28/2023   PHOS 3.5 10/26/2023   PROT 6.1 (L) 10/27/2023   ALBUMIN  3.5 10/27/2023   BILITOT 0.7 10/27/2023   ALKPHOS 60 10/27/2023   AST 20 10/27/2023   ALT 21 10/27/2023   ANIONGAP 10 10/28/2023    CBG (last 3)  No results for input(s): GLUCAP in the last 72 hours.    Coagulation Profile: Recent Labs  Lab 10/25/23 1404  INR 0.9     Radiology Studies: No results found.      Elgie Butter M.D. [facility] 10/29/2023, 1:16 PM  Available via [employer] secure chat 7am-7pm After 7 pm, please refer to night coverage provider listed on amion.

## 2023-10-29 NOTE — Consult Note (Addendum)
 [facility] Consult Follow-up  Patient Name: .[name]  MRN: [id number]  DOB: Aug 13, 1966  Consult Order details:  Dr. Constantino   Mode of Visit: In person    Psychiatry Consult Evaluation  Service Date: October 29, 2023 LOS:  LOS: 3 days  Chief Complaint I can feel myself spiralling  Primary Psychiatric Diagnoses  Treatment resistant depression 2.  Generalized anxiety disorder with panic attacks 3.  Bulimia   Assessment  [name] is a 58 y.o. female admitted: Medically for 10/25/2023 12:48 PM for N+V. She carries the psychiatric diagnoses of depression, anxiety and remote EtOH abuse and has a past medical history of  DJD, endometriosis, HLD, HTN, migraines, pseudoseizure and seizure.   Her current presentation of several months of worsening of low mood, re-emergence of chornic SI, etc is most consistent with known diagnosis of depression.  Feel seasonal component as wellShe meets criteria for GAD with panic attacks and bulimia (early remission) based on clinical interview.  Current outpatient psychotropic medications include amitriptyline  100 mg, buspirone  15 mg BID and historically she has had a fair response to these medications - usually keep her at mild/mod depression in summer with exacerbations every winter. She was compliant with medications prior to admission as evidenced by pt report, fill hx3. On initial examination, patient was quite pleasant and a good historian. She was unwilling to consider medications that would require monitoring (ie low dose lithium, thyroid  hormone) but very open to other modalities. Please see plan below for detailed recommendations.   On reassessment today, patient presents less anxious than she was yesterday. She was able to get a better sleep with the Amitriptyline  (5.5 hrs of sleep). She is also experiencing improvement in her GI symptoms and expresses readiness to be discharged, however she is concerned that her symptoms way  worsen when she gets home. Patient offered support and encouragement. No acute psychiatric concerns noted at this time. No medication side effect reported. Patient would like to explore TMS treatment and she was advised to discuss this with her outpatient provider when discharged. Will like to sign off on patient at this time as she no longer require acute psychiatric need.   Diagnoses:  Active Hospital problems: Principal Problem:   Dilation of biliary tract Active Problems:   Hypokalemia   Acute diarrhea   Seizure-like activity (HCC)   Depression   Dehydration   AKI (acute kidney injury) (HCC)   Tobacco abuse   Nausea & vomiting   Abdominal pain, epigastric    Plan   ## Psychiatric Medication Recommendations:  -- currently on buspirone  15 TID (increased from home dose 15 BID) -- Restart home medication Amitriptyline  100 MG PO QHS  -- Continue vitamin D (last lab low w/ PCP outside [facility] system) -- Continue lamotrigine  25 mg with plan to uptitrate to ?100-150 mg? As outpt.  -- Continue melatonin 10 mg at bedtime -- F/u with your outpatient provider to discuss TMS treatment fro depression.  -- Will signoff at this time. Please re consult psychiatry as needed.   Discussed proper use of sun lamp in AM  ## Medical Decision Making Capacity: Not specifically addressed in this encounter  ## Further Work-up:  -- vit B12, folate (organic contributers to depression)  -- most recent EKG on 2/4 had QtC of 454 -- Pertinent labwork reviewed earlier this admission includes: TSH wnl, pt's cell phone had EMR of PCP - vit D low at 28  From pt's outside records: Vit D low at 28  mg within last year   ## Disposition:-- There are no psychiatric contraindications to discharge at this time Have asked SW to refer to PHP or IOP closer to dc  ## Behavioral / Environmental: - No specific recommendations at this time.     ## Safety and Observation Level:  - Based on my clinical evaluation, I  estimate the patient to be at low risk of self harm in the current setting. - At this time, we recommend  routine. This decision is based on my review of the chart including patient's history and current presentation, interview of the patient, mental status examination, and consideration of suicide risk including evaluating suicidal ideation, plan, intent, suicidal or self-harm behaviors, risk factors, and protective factors. This judgment is based on our ability to directly address suicide risk, implement suicide prevention strategies, and develop a safety plan while the patient is in the clinical setting. Please contact our team if there is a concern that risk level has changed.  CSSR Risk Category:C-SSRS RISK CATEGORY: No Risk  Suicide Risk Assessment: Patient has following modifiable risk factors for suicide: under treated depression  and lack of access to outpatient mental health resources, which we are addressing by referring to therapy, changing med regimen . Patient has following non-modifiable or demographic risk factors for suicide: history of suicide attempt, history of self harm behavior, and psychiatric hospitalization Patient has the following protective factors against suicide: Cultural, spiritual, or religious beliefs that discourage suicide, devotion to family, last suicide attempt > 5 years ago  Thank you for this consult request. Recommendations have been communicated to the primary team.  We signoff at this time. Please re consult psychiatry as needed.   LAWERENCE GUT, NP       History of Present Illness  Relevant Aspects of [facility] Course:  Admitted on 10/25/2023 for GI distress. They endrosed hx depression.   Patient Report:  Feb 8: Patient describes her mood as so so this morning. She reports a better sleep with the help of Amitriptyline  given last night. Patient reports eating a turkey sandwich last night, a hard boiled egg this morning and well tolerated. She  last had diarrhea yesterday afternoon. She experienced mild abdominal cramping which had since resolved. She states that she is ready to be discharged home although she is worried that her GI symptoms may become worse. She is interested in TMS treatment and she has being informed to discuss this with Dr. Akintayo in outpatient. Patient denies SI/HI/AVH.   Psych ROS:  Depression: low mood, low motivation (difficulty getting out of bed most days), usually eating too little w/ binges (on semaglutide), sleep is pretty good - at least 6 hours a night, concentration OK,  chronic suicidal thoughts w/o intent or plan.  Anxiety:  paralyzing fear, social anxiety, has some panic attacks (recently 2-3/week) but background anxiety is main problem.  Mania (lifetime and current): denies Psychosis: (lifetime and current): denies  Collateral information:  None today  Review of Systems  Psychiatric/Behavioral:  Positive for depression. Negative for hallucinations, memory loss, substance abuse and suicidal ideas. The patient is nervous/anxious. The patient does not have insomnia.    - GI distress. Constant diarrhea and abdominal cramps.   Psychiatric and Social History  Psychiatric History:  Information collected from pt, medical record  Prev Dx/Sx: depression, anxiety. Remote hx bulimia (couple weeks ago) Reports long history of fairly treatmetn resistant depression. Last suicidea ttempt (of 10+) in 2016 which was followed by 6 month hospitalizaiton at  [facility]. Dx w/ bipolar in 2010s pt states to get access to addl meds denies hx mania.  Current Psych Provider: Mojeed Akintayo q2-3  Home Meds (current):  buspirone  15 TID Amitriptyline  100 mg at bedtime - apparently had TD on higher dose of this.   Previous Med Trials:  prozac  20 mg Numerous other med trials - basically every antidepressant, most commonly used antipsychotics   On allergy list:  Gabapentin  (abd pain, GI bleed) - pt denies - tried to  remove  Therapy: not currently   Prior Psych Hospitalization: numerous. Notably got several sessions of ECT in [place] system in 2020. Stopped working. Approved for TMS at one point but no-showed.  Prior Self Harm: 10+ lifetime suicide attempts mostly on Tylenol  Prior Violence: no  Family Psych History: aunt bipolar, older sx w/ mood instability, brother who is a monster Family Hx suicide: uncle completed, nephew completed, dad attempted  Social History:  Developmental Hx: deferred Educational Hx: 2 years college major undecided Occupational Hx: disability for depression Legal Hx: deferred Living Situation: with dog  Spiritual Hx: agnostic Access to weapons/lethal means: no, denies large stockpiles meds  Substance History Alcohol: 2 days a year - priorly significant Type of alcohol: margaritas at [employer] Last Drink deferred Number of drinks per day deferred History of alcohol withdrawal seizuresL I think I probably did History of DT's: pt unsure Tobacco: 4 cigarettes/day declined patch Illicit drugs: denies Prescription drug abuse: denied Rehab hx: no  Exam Findings  Physical Exam:  Vital Signs:  Temp:  [98 F (36.7 C)-99.6 F (37.6 C)] 98.1 F (36.7 C) (02/08 0845) Pulse Rate:  [78-99] 82 (02/08 0845) Resp:  [16-18] 17 (02/08 0845) BP: (115-136)/(83-85) 115/84 (02/08 0845) SpO2:  [94 %-100 %] 97 % (02/08 0845) Blood pressure 115/84, pulse 82, temperature 98.1 F (36.7 C), temperature source Oral, resp. rate 17, height 5' 2 (1.575 m), weight 70.3 kg, SpO2 97%. Body mass index is 28.35 kg/m.  Physical Exam HENT:     Head: Normocephalic.  Eyes:     Conjunctiva/sclera: Conjunctivae normal.  Pulmonary:     Effort: Pulmonary effort is normal.  Neurological:     Mental Status: She is alert and oriented to person, place, and time.    - GI distress. Constant diarrhea and abdominal cramps.   Mental Status Exam: General Appearance: Fairly Groomed - hair  brushed, earrings in.   Orientation:  Full (Time, Place, and Person)  Memory:  Immediate;   Good Recent;   Good Remote;   Good  Concentration:  Concentration: Good  Recall:  Good  Attention  Good  Eye Contact:  Good  Speech:  Clear and Coherent  Language:  Good  Volume:  Normal  Mood: So, so  Affect:  Congruent with mood  Thought Process:  Coherent  Thought Content:  Logical and devoid of delusions/paranoia  Suicidal Thoughts:  No  Homicidal Thoughts:  No  Judgement:  Fair  Insight:  Fair  Psychomotor Activity:  Normal  Akathisia:  No  Fund of Knowledge:  Good   No perceptural disturbances endorsed/observed  Assets:  Communication Skills Desire for Improvement Financial Resources/Insurance Housing Leisure Time  Cognition:  WNL  ADL's:  not formally assessed  AIMS (if indicated):        Other History   These have been pulled in through the EMR, reviewed, and updated if appropriate.  Family History:  The patient's family history includes Cancer in an other family member; Heart attack in her father;  Heart failure in her father; Hyperlipidemia in her half-sister, half-sister, and mother; Hypertension in her half-sister, half-sister, and mother.  Medical History: Past Medical History:  Diagnosis Date   Anorexia    Anxiety    Depression    DJD (degenerative joint disease) of cervical spine    Endometriosis    ETOH abuse    sober 3 1/2 years   Hyperlipidemia    Hypertension    Migraines    PICC (peripherally inserted central catheter) in place    rt neck   Psychiatric pseudoseizure    Seizures (HCC)     Surgical History: Past Surgical History:  Procedure Laterality Date   ABDOMINAL HYSTERECTOMY     ABDOMINAL SURGERY     APPENDECTOMY     CARPAL TUNNEL RELEASE     2010   CHOLECYSTECTOMY     ESOPHAGOGASTRODUODENOSCOPY N/A 10/06/2014   Procedure: ESOPHAGOGASTRODUODENOSCOPY (EGD);  Surgeon: Lamar JONETTA Aho, MD;  Location: [facility] ENDOSCOPY;  Service: Endoscopy;   Laterality: N/A;   KNEE SURGERY     laproscopy     NASAL SINUS SURGERY     PERICARDIOCENTESIS N/A 03/16/2021   Procedure: PERICARDIOCENTESIS;  Surgeon: Elmira Newman PARAS, MD;  Location: [facility] INVASIVE CV LAB;  Service: Cardiovascular;  Laterality: N/A;   SPINAL CORD STIMULATOR REMOVAL N/A 12/18/2021   Procedure: Removal of spinal cord stimulator;  Surgeon: Darlis Deatrice RAMAN, MD;  Location: [facility] OR;  Service: Neurosurgery;  Laterality: N/A;  RM 19   VIDEO ASSISTED THORACOSCOPY Right 03/18/2021   Procedure: VIDEO ASSISTED THORACOSCOPY FOR PERICARDIAL WINDOW;  Surgeon: Shyrl Linnie KIDD, MD;  Location: [facility] OR;  Service: Thoracic;  Laterality: Right;  pericardial window     Medications:   Current Facility-Administered Medications:    amitriptyline  (ELAVIL ) tablet 100 mg, 100 mg, Oral, QHS, Jazaria Jarecki, NP, 100 mg at 10/28/23 2203   amLODipine  (NORVASC ) tablet 5 mg, 5 mg, Oral, Daily, Akula, Vijaya, MD, 5 mg at 10/29/23 0932   busPIRone  (BUSPAR ) tablet 15 mg, 15 mg, Oral, TID, Cinderella, Margaret A, 15 mg at 10/29/23 9068   Chlorhexidine  Gluconate Cloth 2 % PADS 6 each, 6 each, Topical, Daily, Akula, Vijaya, MD, 6 each at 10/29/23 1037   cholecalciferol  (VITAMIN D3) 10 MCG (400 UNIT) tablet 400 Units, 400 Units, Oral, Daily, Cinderella, Margaret A, 400 Units at 10/29/23 9068   dicyclomine  (BENTYL ) capsule 10 mg, 10 mg, Oral, TID AC & HS, McGreal, Inocente HERO, MD   fentaNYL  (SUBLIMAZE ) injection 12.5-50 mcg, 12.5-50 mcg, Intravenous, Q2H PRN, Doutova, Anastassia, MD, 50 mcg at 10/29/23 1324   gabapentin  (NEURONTIN ) capsule 300 mg, 300 mg, Oral, QHS, Doutova, Anastassia, MD, 300 mg at 10/28/23 2203   lamoTRIgine  (LAMICTAL ) tablet 25 mg, 25 mg, Oral, Daily, Cinderella, Margaret A, 25 mg at 10/29/23 0931   melatonin tablet 10 mg, 10 mg, Oral, QHS, Akula, Vijaya, MD, 10 mg at 10/28/23 2202   methocarbamol  (ROBAXIN ) tablet 750 mg, 750 mg, Oral, Q8H PRN, Akula, Vijaya, MD, 750 mg at 10/28/23 0954    metoCLOPramide  (REGLAN ) tablet 5 mg, 5 mg, Oral, TID AC, Suzann Inocente HERO, MD, 5 mg at 10/29/23 1328   ondansetron  (ZOFRAN ) tablet 4 mg, 4 mg, Oral, Q6H PRN, 4 mg at 10/28/23 1424 **OR** ondansetron  (ZOFRAN ) injection 4 mg, 4 mg, Intravenous, Q6H PRN, Doutova, Anastassia, MD, 4 mg at 10/29/23 1042   pantoprazole  (PROTONIX ) EC tablet 40 mg, 40 mg, Oral, Q0600, Akula, Vijaya, MD, 40 mg at 10/29/23 0534   rosuvastatin  (CRESTOR ) tablet 20 mg,  20 mg, Oral, QHS, Doutova, Anastassia, MD, 20 mg at 10/28/23 2202   simethicone  (MYLICON) chewable tablet 80 mg, 80 mg, Oral, QID PRN, Akula, Vijaya, MD, 80 mg at 10/29/23 0751   sodium chloride  flush (NS) 0.9 % injection 10-40 mL, 10-40 mL, Intracatheter, Q12H, Akula, Vijaya, MD, 10 mL at 10/28/23 2206   sodium chloride  flush (NS) 0.9 % injection 10-40 mL, 10-40 mL, Intracatheter, PRN, Akula, Vijaya, MD   tapentadol  (NUCYNTA ) 12 hr tablet 50 mg, 50 mg, Oral, Q12H, Doutova, Anastassia, MD, 50 mg at 10/29/23 0932   tapentadol  (NUCYNTA ) tablet 50 mg, 50 mg, Oral, TID PRN, Akula, Vijaya, MD, 50 mg at 10/28/23 1437   vitamin B-12 (CYANOCOBALAMIN ) tablet 500 mcg, 500 mcg, Oral, Daily, Akula, Vijaya, MD, 500 mcg at 10/29/23 0932  Allergies: Allergies  Allergen Reactions   Aspirin  Other (See Comments)    GI bleed.   Coconut (Cocos Nucifera) Rash   Doxycycline Anaphylaxis and Other (See Comments)    Joint damage also   Gabapentin      Other reaction(s): Abdominal Pain, GI Bleed   Imitrex [Sumatriptan Base] Other (See Comments)    Severe hypertension, all triptans   Iodinated Contrast Media Anaphylaxis   Lactose Diarrhea    Other reaction(s): GI Upset (intolerance)   Lidocaine  Anaphylaxis    Pt states she does well with Marcaine /Bupivicaine without problem   Prednisone  Other (See Comments)    She goes crazy   Sulfa Antibiotics Anaphylaxis   Sulfasalazine Anaphylaxis   Sumatriptan Other (See Comments)    Felt like she was having a stroke, heart rate and  blood pressure went through the roof   Levofloxacin Other (See Comments)    Joints hurt   Amoxicillin-Pot Clavulanate Other (See Comments)    Pt can't remember what type of reaction she had, but states her pharmacy has it listed in their allergy list.  12/18/21 Pt has tolerated Ancef  in the past.   Caffeine -Sodium Benzoate     Other reaction(s): Dizziness (intolerance)   Other Other (See Comments)    Artificial sugar - migraine   Prochlorperazine  Other (See Comments)    Tonic/clonic seizure   Latex Rash   Levofloxacin Rash and Other (See Comments)    Joints hurt   Metrizamide Other (See Comments)   Nsaids Rash and Other (See Comments)    GI bleed   Tolmetin Rash    Jennavecia Schwier, NP

## 2023-10-29 NOTE — Plan of Care (Signed)

## 2023-10-30 ENCOUNTER — Encounter (HOSPITAL_COMMUNITY): Payer: Self-pay | Admitting: Internal Medicine

## 2023-10-30 ENCOUNTER — Inpatient Hospital Stay (HOSPITAL_COMMUNITY): Payer: 59 | Admitting: Certified Registered Nurse Anesthetist

## 2023-10-30 ENCOUNTER — Encounter (HOSPITAL_COMMUNITY)
Admission: EM | Disposition: A | Discharge status: Home / Self Care | Payer: Self-pay | Source: Home / Self Care | Attending: Internal Medicine

## 2023-10-30 DIAGNOSIS — R112 Nausea with vomiting, unspecified: Secondary | ICD-10-CM | POA: Diagnosis not present

## 2023-10-30 DIAGNOSIS — Z8719 Personal history of other diseases of the digestive system: Secondary | ICD-10-CM

## 2023-10-30 DIAGNOSIS — R569 Unspecified convulsions: Secondary | ICD-10-CM | POA: Diagnosis not present

## 2023-10-30 DIAGNOSIS — N179 Acute kidney failure, unspecified: Secondary | ICD-10-CM | POA: Diagnosis not present

## 2023-10-30 DIAGNOSIS — E876 Hypokalemia: Secondary | ICD-10-CM | POA: Diagnosis not present

## 2023-10-30 DIAGNOSIS — K838 Other specified diseases of biliary tract: Secondary | ICD-10-CM | POA: Diagnosis not present

## 2023-10-30 DIAGNOSIS — R1013 Epigastric pain: Secondary | ICD-10-CM | POA: Diagnosis not present

## 2023-10-30 HISTORY — PX: ESOPHAGOGASTRODUODENOSCOPY (EGD) WITH PROPOFOL: SHX5813

## 2023-10-30 HISTORY — PX: BIOPSY: SHX5522

## 2023-10-30 LAB — BASIC METABOLIC PANEL
Anion gap: 11 (ref 5–15)
BUN: 10 mg/dL (ref 6–20)
CO2: 29 mmol/L (ref 22–32)
Calcium: 9.2 mg/dL (ref 8.9–10.3)
Chloride: 102 mmol/L (ref 98–111)
Creatinine, Ser: 0.75 mg/dL (ref 0.44–1.00)
GFR, Estimated: >60 mL/min (ref >60)
Glucose, Bld: 97 mg/dL (ref 70–99)
Potassium: 3.5 mmol/L (ref 3.5–5.1)
Sodium: 142 mmol/L (ref 135–145)

## 2023-10-30 LAB — CULTURE, BLOOD (ROUTINE X 2)
Culture: NO GROWTH
Culture: NO GROWTH

## 2023-10-30 LAB — GLUCOSE, CAPILLARY
Glucose-Capillary: 62 mg/dL — ABNORMAL LOW (ref 70–99)
Glucose-Capillary: 74 mg/dL (ref 70–99)

## 2023-10-30 SURGERY — ESOPHAGOGASTRODUODENOSCOPY (EGD) WITH PROPOFOL
Anesthesia: Monitor Anesthesia Care

## 2023-10-30 MED ORDER — METOCLOPRAMIDE HCL 5 MG PO TABS: 5.0000 mg | ORAL_TABLET | Freq: Three times a day (TID) | ORAL | 0 refills | Status: AC

## 2023-10-30 MED ORDER — LAMOTRIGINE 25 MG PO TABS: 25.0000 mg | ORAL_TABLET | Freq: Every day | ORAL | 2 refills | Status: AC

## 2023-10-30 MED ORDER — FENTANYL CITRATE (PF) 100 MCG/2ML IJ SOLN
INTRAMUSCULAR | Status: AC
  Administered 2023-10-30: 25 ug via INTRAVENOUS
  Filled 2023-10-30: qty 2

## 2023-10-30 MED ORDER — OXYCODONE HCL 5 MG/5ML PO SOLN
5.0000 mg | Freq: Once | ORAL | Status: DC | PRN
Start: 2023-10-30 — End: 2023-10-30

## 2023-10-30 MED ORDER — DICYCLOMINE HCL 10 MG PO CAPS: 10.0000 mg | ORAL_CAPSULE | Freq: Three times a day (TID) | ORAL | 0 refills | Status: AC

## 2023-10-30 MED ORDER — BUSPIRONE HCL 15 MG PO TABS: 15.0000 mg | ORAL_TABLET | Freq: Three times a day (TID) | ORAL | 2 refills | Status: AC

## 2023-10-30 MED ORDER — CHOLECALCIFEROL 10 MCG (400 UNIT) PO TABS: 400.0000 [IU] | ORAL_TABLET | Freq: Every day | ORAL | 0 refills | Status: AC

## 2023-10-30 MED ORDER — PROPOFOL 500 MG/50ML IV EMUL
INTRAVENOUS | Status: DC | PRN
  Administered 2023-10-30: 180 ug/kg/min via INTRAVENOUS

## 2023-10-30 MED ORDER — METHOCARBAMOL 750 MG PO TABS: 750.0000 mg | ORAL_TABLET | Freq: Three times a day (TID) | ORAL | 2 refills | Status: AC | PRN

## 2023-10-30 MED ORDER — PROPOFOL 10 MG/ML IV BOLUS
INTRAVENOUS | Status: DC | PRN
  Administered 2023-10-30 (×2): 20 mg via INTRAVENOUS

## 2023-10-30 MED ORDER — PANTOPRAZOLE SODIUM 20 MG PO TBEC: 20.0000 mg | DELAYED_RELEASE_TABLET | Freq: Two times a day (BID) | ORAL | 0 refills | Status: DC

## 2023-10-30 MED ORDER — FENTANYL CITRATE (PF) 100 MCG/2ML IJ SOLN
25.0000 ug | INTRAMUSCULAR | Status: DC | PRN
  Administered 2023-10-30: 25 ug via INTRAVENOUS

## 2023-10-30 MED ORDER — OXYCODONE HCL 5 MG PO TABS: 5.0000 mg | ORAL_TABLET | Freq: Once | ORAL | Status: DC | PRN

## 2023-10-30 MED ORDER — DEXTROSE-SODIUM CHLORIDE 5-0.9 % IV SOLN: INTRAVENOUS | Status: AC

## 2023-10-30 SURGICAL SUPPLY — 14 items

## 2023-10-30 NOTE — Anesthesia Postprocedure Evaluation (Signed)
 Anesthesia Post Note  Patient: Chauntelle Azpeitia  Procedure(s) Performed: ESOPHAGOGASTRODUODENOSCOPY (EGD) WITH PROPOFOL  BIOPSY     Patient location during evaluation: PACU Anesthesia Type: MAC Level of consciousness: awake and alert Pain management: pain level controlled Vital Signs Assessment: post-procedure vital signs reviewed and stable Respiratory status: spontaneous breathing, nonlabored ventilation, respiratory function stable and patient connected to nasal cannula oxygen Cardiovascular status: stable and blood pressure returned to baseline Postop Assessment: no apparent nausea or vomiting Anesthetic complications: no   No notable events documented.  Last Vitals:  Vitals:   10/30/23 1100 10/30/23 1115  BP: 117/71 127/74  Pulse: 84 72  Resp: 19 12  Temp:    SpO2: 96% 97%    Last Pain:  Vitals:   10/30/23 1100  TempSrc:   PainSc: 2                  Lynwood MARLA Cornea

## 2023-10-30 NOTE — Plan of Care (Signed)

## 2023-10-30 NOTE — Transfer of Care (Signed)
 Immediate Anesthesia Transfer of Care Note  Patient: [name]  Procedure(s) Performed: ESOPHAGOGASTRODUODENOSCOPY (EGD) WITH PROPOFOL  BIOPSY  Patient Location: PACU  Anesthesia Type:MAC  Level of Consciousness: awake, alert , and oriented  Airway & Oxygen Therapy: Patient Spontanous Breathing  Post-op Assessment: Report given to RN and Post -op Vital signs reviewed and stable  Post vital signs: Reviewed and stable  Last Vitals:  Vitals Value Taken Time  BP    Temp    Pulse 88 10/30/23 1030  Resp 15 10/30/23 1030  SpO2 97 % 10/30/23 1030  Vitals shown include unfiled device data.  Last Pain:  Vitals:   10/30/23 0954  TempSrc: Temporal  PainSc: 4       Patients Stated Pain Goal: 2 (10/30/23 0933)  Complications: No notable events documented.

## 2023-10-30 NOTE — Plan of Care (Signed)
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
## 2023-10-30 NOTE — Op Note (Signed)
 [facility] Patient Name: [name] Procedure Date : 10/30/2023 MRN: [id number] Attending MD: Inocente Hausen , MD, [id number] Date of Birth: May 19, 1966 CSN: [id number] Age: 58 Admit Type: Inpatient Procedure:                Upper GI endoscopy Indications:              Epigastric abdominal pain, Nausea with vomiting,                            Personal history of peptic ulcer disease -duodenal                            ulcers on EGD in 2016 Providers:                Inocente Hausen, MD, Collene Edu, RN, Farris Southgate,                            Technician Referring MD:              Medicines:                Monitored Anesthesia Care Complications:            No immediate complications. Estimated blood loss:                            Minimal. Estimated Blood Loss:     Estimated blood loss was minimal. Procedure:                Pre-Anesthesia Assessment:                           - Prior to the procedure, a History and Physical                            was performed, and patient medications and                            allergies were reviewed. The patient's tolerance of                            previous anesthesia was also reviewed. The risks                            and benefits of the procedure and the sedation                            options and risks were discussed with the patient.                            All questions were answered, and informed consent                            was obtained. Prior Anticoagulants: The patient has  taken no anticoagulant or antiplatelet agents. ASA                            Grade Assessment: II - A patient with mild systemic                            disease. After reviewing the risks and benefits,                            the patient was deemed in satisfactory condition to                            undergo the procedure.                           After obtaining informed consent, the  endoscope was                            passed under direct vision. Throughout the                            procedure, the patient's blood pressure, pulse, and                            oxygen saturations were monitored continuously. The                            [id number]-H190 ([id number] Olympus endoscope was introduced                            through the mouth, and advanced to the second part                            of duodenum. The upper GI endoscopy was                            accomplished without difficulty. The patient                            tolerated the procedure well. Scope In: Scope Out: Findings:      The examined esophagus was normal.      Scattered mild inflammation characterized by erythema was found in the       gastric antrum. Biopsies were taken with a cold forceps for Helicobacter       pylori testing.      The cardia (on retroflexion) and gastric fundus (on retroflexion) were       normal.      The duodenal bulb and second portion of the duodenum were normal. Impression:               - Normal esophagus.                           - Gastritis. Biopsied.                           -  Normal cardia and gastric fundus.                           - Normal duodenal bulb and second portion of the                            duodenum. Recommendation:           - Return patient to hospital ward for ongoing care.                           - Await pathology results.                           - In setting of gastritis consider increasing                            pantoprazole  to 20 mg p.o. twice daily x 30 days                           - Can continue dicyclomine  10 mg p.o. 4 times daily                            scheduled or dose reduce if this is felt to be                            contributing to constipation                           - Encourage p.o. intake                           - Results discussed with patient Procedure Code(s):        --- Professional  ---                           [phone], Esophagogastroduodenoscopy, flexible,                            transoral; with biopsy, single or multiple Diagnosis Code(s):        --- Professional ---                           K29.70, Gastritis, unspecified, without bleeding                           R10.13, Epigastric pain                           R11.2, Nausea with vomiting, unspecified                           Z87.11, Personal history of peptic ulcer disease CPT copyright 2022 American [employer] Association. All rights reserved. The codes documented in this report are preliminary and upon coder review may  be revised to meet current compliance requirements. Inocente Hausen, MD 10/30/2023 10:31:11 AM This  report has been signed electronically. Number of Addenda: 0

## 2023-10-30 NOTE — Progress Notes (Signed)
 [facility]                                                                               [name], is a 58 y.o. female, DOB - [birth year], FMW:[id number] Admit date - 10/25/2023    Outpatient Primary MD for the patient is Hailey Rosina SAILOR, PA  LOS - 4  days    Brief summary    [name] is a 58 y.o. female with medical history significant of   migraine,depression, HLD, PUD, IBS, hypertension, ongoing tobacco use, pseudoseizures, admitted for nausea, vomiting , diarrhea and abdominal pain.  Assessment & Plan    Assessment and Plan:   Nausea, vomiting, diarrhea and abdominal pain in the setting of IBS As per the patient, she has chronic diarrhea from IBS.  She also reports her diarrhea worsens with seasonal worsening of depression.  Improvement in her nausea, vomiting and diarrhea. GI on board, plan for EGD in am. EGD showing gastritis. Biopsies taken.  Increase Protonix  20 mg BID.  MRCP done, showing dilated intra and extrahepatic biliary ducts.  GI consulted , suggested the dilated intra and extrahepatic ducts probably a post cholecystectomy state and chronic narcotic use.  GI panel is negative   H/o PUD, duodenal ulcer in the past Continue with PPI.    IBS Recommend outpatient follow up with GI.    Hypokalemia:  Replaced , repeat levels are low. Replaced this am.  Repeat BMP in the morning.    Hypertension Optimal BP parameters.  Continue with norvasc  5 mg daily.    Hyperlipidemia Resume crestor .    Anxiety and depression.  Psychiatry consulted.  No suicidal ideations.  Vit b12 204 , low normal. Vitamin b12 supplementation will be added.  Folate wnl.  Continue with Buspar  15 mg TID, LAMICTAL   25 mg daily, amitriptyline  added today.  No changes in meds.    Pseudoseizures:  Continue with gabapentin  300 mg daily at bedtime.   Hypoglycemia Started on dextrose  fluids.  Monitor overnight.     Estimated body mass index is 28.35  kg/m as calculated from the following:   Height as of this encounter: 5' 2 (1.575 m).   Weight as of this encounter: 70.3 kg.  Code Status: full code.  DVT Prophylaxis:  SCDs Start: 10/26/23 0307   Level of Care: Level of care: Med-Surg Family Communication: none at bedside.   Disposition Plan:     Remains inpatient appropriate:  possible d/c in am.   Procedures:  MRCP EGD  Consultants:   Psychiatry gastroenterology  Antimicrobials:   Anti-infectives (From admission, onward)    None        Medications  Scheduled Meds:  amitriptyline   100 mg Oral QHS   amLODipine   5 mg Oral Daily   busPIRone   15 mg Oral TID   Chlorhexidine  Gluconate Cloth  6 each Topical Daily   cholecalciferol   400 Units Oral Daily   dicyclomine   10 mg Oral TID AC & HS   gabapentin   300 mg Oral QHS   lamoTRIgine   25 mg Oral Daily   melatonin  10 mg Oral QHS   metoCLOPramide   5 mg Oral TID AC  pantoprazole   40 mg Oral Q0600   rosuvastatin   20 mg Oral QHS   tapentadol   50 mg Oral Q12H   vitamin B-12  500 mcg Oral Daily   Continuous Infusions:  dextrose  5 % and 0.9 % NaCl      PRN Meds:.fentaNYL  (SUBLIMAZE ) injection, hydrocortisone  cream, methocarbamol , ondansetron  **OR** ondansetron  (ZOFRAN ) IV, simethicone , tapentadol     Subjective:   [name] was seen and examined today. No chest pain . EGD today.   Objective:   Vitals:   10/30/23 1100 10/30/23 1115 10/30/23 1639 10/30/23 1700  BP: 117/71 127/74 (!) 94/57 112/68  Pulse: 84 72 80 78  Resp: 19 12 17    Temp:   98.5 F (36.9 C)   TempSrc:   Oral   SpO2: 96% 97% 96%   Weight:      Height:        Intake/Output Summary (Last 24 hours) at 10/30/2023 1814 Last data filed at 10/30/2023 1500 Gross per 24 hour  Intake 752.33 ml  Output --  Net 752.33 ml    Filed Weights   10/25/23 1254 10/26/23 0315  Weight: 73 kg 70.3 kg     Exam General exam: Appears calm and comfortable  Respiratory system: Clear to auscultation.  Respiratory effort normal. Cardiovascular system: S1 & S2 heard, RRR. Gastrointestinal system: Abdomen is nondistended, soft and nontender. Central nervous system: Alert and oriented. No focal neurological deficits. Extremities: Symmetric 5 x 5 power. Skin: No rashes,  Psychiatry: Mood & affect appropriate.       Data Reviewed:  I have personally reviewed following labs and imaging studies   CBC Lab Results  Component Value Date   WBC 8.2 10/27/2023   RBC 4.52 10/27/2023   HGB 14.5 10/27/2023   HCT 40.9 10/27/2023   MCV 90.5 10/27/2023   MCH 32.1 10/27/2023   PLT 316 10/27/2023   MCHC 35.5 10/27/2023   RDW 13.0 10/27/2023   LYMPHSABS 3.1 10/27/2023   MONOABS 0.7 10/27/2023   EOSABS 0.2 10/27/2023   BASOSABS 0.0 10/27/2023     Last metabolic panel Lab Results  Component Value Date   NA 142 10/30/2023   K 3.5 10/30/2023   CL 102 10/30/2023   CO2 29 10/30/2023   BUN 10 10/30/2023   CREATININE 0.75 10/30/2023   GLUCOSE 97 10/30/2023   GFRNONAA >60 10/30/2023   GFRAA >60 02/05/2019   CALCIUM  9.2 10/30/2023   PHOS 3.5 10/26/2023   PROT 6.1 (L) 10/27/2023   ALBUMIN  3.5 10/27/2023   BILITOT 0.7 10/27/2023   ALKPHOS 60 10/27/2023   AST 20 10/27/2023   ALT 21 10/27/2023   ANIONGAP 11 10/30/2023    CBG (last 3)  Recent Labs    10/30/23 1751  GLUCAP 62*      Coagulation Profile: Recent Labs  Lab 10/25/23 1404  INR 0.9     Radiology Studies: No results found.      [name] M.D. [facility] 10/30/2023, 6:14 PM  Available via Epic secure chat 7am-7pm After 7 pm, please refer to night coverage provider listed on amion.

## 2023-10-30 NOTE — Anesthesia Preprocedure Evaluation (Signed)
 Anesthesia Evaluation  Patient identified by MRN, date of birth, ID band Patient awake    Reviewed: Allergy & Precautions, NPO status , Patient's Chart, lab work & pertinent test results, reviewed documented beta blocker date and time   History of Anesthesia Complications Negative for: history of anesthetic complications  Airway Mallampati: III  TM Distance: >3 FB Neck ROM: Limited    Dental no notable dental hx.    Pulmonary neg COPD, Current Smoker and Patient abstained from smoking., neg PE   breath sounds clear to auscultation       Cardiovascular hypertension, (-) CAD, (-) Past MI, (-) Cardiac Stents and (-) CABG  Rhythm:Regular Rate:Normal     Neuro/Psych  Headaches, Seizures -, Well Controlled,  PSYCHIATRIC DISORDERS Anxiety Depression Bipolar Disorder      GI/Hepatic PUD,,,(+) Hepatitis -  Endo/Other    Renal/GU ARFRenal disease     Musculoskeletal  (+) Arthritis ,    Abdominal   Peds  Hematology  (+) Blood dyscrasia, anemia   Anesthesia Other Findings   Reproductive/Obstetrics                             Anesthesia Physical Anesthesia Plan  ASA: 2  Anesthesia Plan: MAC   Post-op Pain Management:    Induction: Intravenous  PONV Risk Score and Plan: 2 and Ondansetron  and Dexamethasone   Airway Management Planned:   Additional Equipment:   Intra-op Plan:   Post-operative Plan:   Informed Consent: I have reviewed the patients History and Physical, chart, labs and discussed the procedure including the risks, benefits and alternatives for the proposed anesthesia with the patient or authorized representative who has indicated his/her understanding and acceptance.     Dental advisory given  Plan Discussed with: CRNA  Anesthesia Plan Comments:        Anesthesia Quick Evaluation

## 2023-10-31 ENCOUNTER — Encounter (HOSPITAL_COMMUNITY): Payer: Self-pay | Admitting: Pediatrics

## 2023-10-31 ENCOUNTER — Telehealth: Payer: Self-pay | Admitting: *Deleted

## 2023-10-31 DIAGNOSIS — F331 Major depressive disorder, recurrent, moderate: Secondary | ICD-10-CM

## 2023-10-31 DIAGNOSIS — K2970 Gastritis, unspecified, without bleeding: Secondary | ICD-10-CM | POA: Diagnosis not present

## 2023-10-31 DIAGNOSIS — R1010 Upper abdominal pain, unspecified: Secondary | ICD-10-CM

## 2023-10-31 DIAGNOSIS — R112 Nausea with vomiting, unspecified: Secondary | ICD-10-CM | POA: Diagnosis not present

## 2023-10-31 DIAGNOSIS — E876 Hypokalemia: Secondary | ICD-10-CM | POA: Diagnosis not present

## 2023-10-31 DIAGNOSIS — K838 Other specified diseases of biliary tract: Secondary | ICD-10-CM | POA: Diagnosis not present

## 2023-10-31 DIAGNOSIS — N179 Acute kidney failure, unspecified: Secondary | ICD-10-CM | POA: Diagnosis not present

## 2023-10-31 DIAGNOSIS — R197 Diarrhea, unspecified: Secondary | ICD-10-CM | POA: Diagnosis not present

## 2023-10-31 LAB — HEMOGLOBIN A1C
Hgb A1c MFr Bld: 5.6 % (ref 4.8–5.6)
Mean Plasma Glucose: 114.02 mg/dL

## 2023-10-31 MED ORDER — HEPARIN SOD (PORK) LOCK FLUSH 100 UNIT/ML IV SOLN
500.0000 [IU] | INTRAVENOUS | Status: AC | PRN
  Administered 2023-10-31: 500 [IU]

## 2023-10-31 NOTE — Plan of Care (Signed)

## 2023-10-31 NOTE — Telephone Encounter (Addendum)
-----   Message from Alfonza Iles sent at 10/31/2023 11:35 AM EST ----- Regarding: Follow-up office visit This patient was seen by Dr. Yvone Herd while in the hospital this past week with an acute gastroenteritis and IBS she needs an office follow-up with Dr. Yvone Herd it is okay if that is 8 weeks out or more please contact the patient with appointment details -thank you

## 2023-10-31 NOTE — Progress Notes (Signed)
 Patient ID: [name], female   DOB: [birth year], 58 y.o.   MRN: [id number]    Progress Note   Subjective   Day # 5 CC; acute illness with nausea vomiting diarrhea abdominal pain/history IBS  EGD yesterday-mild gastritis, biopsies pending, otherwise unremarkable  Infectious workup negative No new labs today  Patient sitting up in bed, had just eaten solid food for breakfast, says she still has some nausea but took Zofran  so hoping to keep it down.  She has not had any diarrhea over the past couple of days and in fact had not had any bowel movements so not using Bentyl  on a regular basis. She feels that she has made significant improvement since admission, hoping she can be discharged soon     Objective   Vital signs in last 24 hours: Temp:  [97.5 F (36.4 C)-99 F (37.2 C)] 98.1 F (36.7 C) (02/10 0830) Pulse Rate:  [77-83] 83 (02/10 0830) Resp:  [17] 17 (02/10 0830) BP: (94-125)/(57-82) 122/81 (02/10 0830) SpO2:  [94 %-98 %] 94 % (02/10 0830) Last BM Date : 10/29/23 General:   Older white female in NAD Heart:  Regular rate and rhythm; no murmurs Lungs: Respirations even and unlabored, lungs CTA bilaterally Abdomen:  Soft, no focal tenderness nondistended. Normal bowel sounds. Extremities:  Without edema. Neurologic:  Alert and oriented,  grossly normal neurologically. Psych:  Cooperative. Normal mood and affect.  Intake/Output from previous day: 02/09 0701 - 02/10 0700 In: 1509.3 [P.O.:740; I.V.:769.3] Out: -  Intake/Output this shift: No intake/output data recorded.  Lab Results: No results for input(s): "WBC", "HGB", "HCT", "PLT" in the last 72 hours. BMET Recent Labs    10/30/23 0308  NA 142  K 3.5  CL 102  CO2 29  GLUCOSE 97  BUN 10  CREATININE 0.75  CALCIUM  9.2   LFT No results for input(s): "PROT", "ALBUMIN ", "AST", "ALT", "ALKPHOS", "BILITOT", "BILIDIR", "IBILI" in the last 72 hours. PT/INR No results for input(s): "LABPROT", "INR" in the last 72  hours.      Assessment / Plan:    #73 58 year old whitete female with history of remote duodenal ulcer, chronic pain syndrome with opioid dependence, IBS, pseudoseizures and migraines admitted with upper abdominal pain nausea vomiting and diarrhea-ill for about 48 hours prior to admission  GI path panel and C. difficile quick screen both negative Symptoms felt very consistent with an acute gastroenteritis, probably viral exacerbating underlying IBS-D  EGD yesterday with mild gastritis, biopsies pending  #2 hypokalemia-corrected #3 ductal dilation inpatient status postcholecystectomy, MRCP this admission without any evidence of choledocholithiasis or obstructing lesion.  Felt consistent with postcholecystectomy state  Plan; continue soft diet as tolerates Supportive management with antiemetics, continue PPI/Protonix  twice daily x 1 month then once daily thereafter Continue dicyclomine  10 mg every 6 hours as needed per abdominal pain/cramping  Patient would like to be established with Dr. Yvone Herd, we will arrange for an outpatient follow-up  GI will sign off, available if needed, hopefully she can be discharged later today or in a.m.    Principal Problem:   Dilation of biliary tract Active Problems:   Hypokalemia   Acute diarrhea   Seizure-like activity (HCC)   Depression   Dehydration   AKI (acute kidney injury) (HCC)   Tobacco abuse   Nausea & vomiting   Abdominal pain, epigastric   History of duodenal ulcer     LOS: 5 days   Chrisy Hillebrand PA-C 10/31/2023, 11:27 AM

## 2023-10-31 NOTE — Telephone Encounter (Signed)
 Left message for patient to call back. She has been scheduled to see Dr Yvone Herd on 01/02/24 at 230 pm.

## 2023-10-31 NOTE — Discharge Summary (Signed)
 Physician Discharge Summary   Patient: [name] MRN: [id number] DOB: 1966/04/07  Admit date:     10/25/2023  Discharge date: 10/31/23  Discharge Physician: Feliciana Horn   PCP: Dianah Fort, PA   Recommendations at discharge:  Please follow up with PCp in one week.  Please follow up with Gastroenterology as recommended.  Please follow up with psychiatry as scheduled.  Please check cbc and bmp in one week.   Discharge Diagnoses: Principal Problem:   Dilation of biliary tract Active Problems:   Hypokalemia   Acute diarrhea   Seizure-like activity (HCC)   Depression   Dehydration   AKI (acute kidney injury) (HCC)   Tobacco abuse   Nausea & vomiting   Abdominal pain, epigastric   History of duodenal ulcer    Hospital Course: [name] is a 58 y.o. female with medical history significant of migraine,depression, HLD, PUD, IBS, hypertension, ongoing tobacco use, pseudoseizures, admitted for nausea, vomiting , diarrhea and abdominal pain.   Assessment and Plan:  Nausea, vomiting, diarrhea and abdominal pain in the setting of IBS As per the patient, she has chronic diarrhea from IBS.  She also reports her diarrhea worsens with seasonal worsening of depression.  Improvement in her nausea, vomiting and diarrhea. GI on board, plan for EGD in am. EGD showing gastritis. Biopsies taken.  Increase Protonix  20 mg BID.  MRCP done, showing dilated intra and extrahepatic biliary ducts.  GI consulted , suggested the dilated intra and extrahepatic ducts probably a post cholecystectomy state and chronic narcotic use.  GI panel is negative     H/o PUD, duodenal ulcer in the past Continue with PPI.      IBS Recommend outpatient follow up with GI.      Hypokalemia:  Replaced      Hypertension Optimal BP parameters.  Continue with norvasc  5 mg daily.      Hyperlipidemia Resume crestor .      Anxiety and depression.  Psychiatry consulted.  No suicidal ideations.  Vit  b12 204 , low normal. Vitamin b12 supplementation will be added.  Folate wnl.  Continue with Buspar  15 mg TID, LAMICTAL   25 mg daily, amitriptyline  added  No changes in meds.      Pseudoseizures:  Continue with gabapentin  300 mg daily at bedtime.    Hypoglycemia Resolved.        Estimated body mass index is 28.35 kg/m as calculated from the following:   Height as of this encounter: 5\' 2"  (1.575 m).   Weight as of this encounter: 70.3 kg.     Consultants: gastroenterology Psychiatry.  Procedures performed: EGD  Disposition: Home Diet recommendation:  Discharge Diet Orders (From admission, onward)     Start     Ordered   10/31/23 0000  Diet - low sodium heart healthy        10/31/23 1159   10/30/23 0000  Diet - low sodium heart healthy        10/30/23 1653           Regular diet DISCHARGE MEDICATION: Allergies as of 10/31/2023       Reactions   Aspirin  Other (See Comments)   GI bleed.   Coconut (cocos Nucifera) Rash   Doxycycline Anaphylaxis, Other (See Comments)   Joint damage also   Gabapentin     Other reaction(s): Abdominal Pain, GI Bleed   Imitrex [sumatriptan Base] Other (See Comments)   Severe hypertension, all triptans   Iodinated Contrast Media Anaphylaxis   Lactose Diarrhea  Other reaction(s): GI Upset (intolerance)   Lidocaine  Anaphylaxis   Pt states she does well with Marcaine /Bupivicaine without problem   Prednisone  Other (See Comments)   She goes crazy   Sulfa Antibiotics Anaphylaxis   Sulfasalazine Anaphylaxis   Sumatriptan Other (See Comments)   Felt like she was "having a stroke", heart rate and blood pressure "went through the roof"   Levofloxacin Other (See Comments)   Joints hurt   Amoxicillin-pot Clavulanate Other (See Comments)   Pt can't remember what type of reaction she had, but states her pharmacy has it listed in their allergy list. 12/18/21 Pt has tolerated Ancef  in the past.   Caffeine -sodium Benzoate    Other reaction(s):  Dizziness (intolerance)   Other Other (See Comments)   Artificial sugar - migraine   Prochlorperazine  Other (See Comments)   Tonic/clonic seizure   Latex Rash   Levofloxacin Rash, Other (See Comments)   Joints hurt   Metrizamide Other (See Comments)   Nsaids Rash, Other (See Comments)   GI bleed   Tolmetin Rash        Medication List     STOP taking these medications    dicyclomine  20 MG tablet Commonly known as: BENTYL  Replaced by: dicyclomine  10 MG capsule       TAKE these medications    acetaminophen  500 MG tablet Commonly known as: TYLENOL  Take 1,000 mg by mouth every 6 (six) hours as needed for moderate pain (pain score 4-6) or mild pain (pain score 1-3).   amitriptyline  100 MG tablet Commonly known as: ELAVIL  Take 100 mg by mouth at bedtime.   amLODipine  10 MG tablet Commonly known as: NORVASC  Take 5 mg by mouth daily.   busPIRone  15 MG tablet Commonly known as: BUSPAR  Take 1 tablet (15 mg total) by mouth 3 (three) times daily. What changed:  medication strength how much to take when to take this Another medication with the same name was removed. Continue taking this medication, and follow the directions you see here.   cholecalciferol  10 MCG (400 UNIT) Tabs tablet Commonly known as: VITAMIN D3 Take 1 tablet (400 Units total) by mouth daily.   cyanocobalamin  500 MCG tablet Commonly known as: VITAMIN B12 Take 1 tablet (500 mcg total) by mouth daily.   diclofenac Sodium 1 % Gel Commonly known as: VOLTAREN Apply 1 Application topically as needed (pain).   dicyclomine  10 MG capsule Commonly known as: BENTYL  Take 1 capsule (10 mg total) by mouth 4 (four) times daily -  before meals and at bedtime. Replaces: dicyclomine  20 MG tablet   fluticasone  50 MCG/ACT nasal spray Commonly known as: FLONASE  Place 1 spray into both nostrils 3 (three) times daily as needed for allergies.   gabapentin  300 MG capsule Commonly known as: NEURONTIN  Take 1  capsule (300 mg total) by mouth at bedtime. What changed: how much to take   hydrochlorothiazide  25 MG tablet Commonly known as: HYDRODIURIL  Take 25 mg by mouth daily.   lamoTRIgine  25 MG tablet Commonly known as: LAMICTAL  Take 1 tablet (25 mg total) by mouth daily.   loperamide  2 MG capsule Commonly known as: IMODIUM  Take 2-4 mg by mouth as needed for diarrhea or loose stools.   Melatonin 10 MG Tabs Take 10 mg by mouth at bedtime.   methocarbamol  750 MG tablet Commonly known as: ROBAXIN  Take 1 tablet (750 mg total) by mouth every 8 (eight) hours as needed for muscle spasms. What changed:  how much to take when to take this  reasons to take this   metoCLOPramide  5 MG tablet Commonly known as: REGLAN  Take 1 tablet (5 mg total) by mouth 3 (three) times daily before meals.   multivitamin with minerals Tabs tablet Take 1 tablet by mouth daily. Woman 50+ advance   tapentadol  50 MG tablet Commonly known as: NUCYNTA  Take 50 mg by mouth 3 (three) times daily as needed for moderate pain (pain score 4-6) or severe pain (pain score 7-10).   Nucynta  ER 50 MG 12 hr tablet Generic drug: tapentadol  Take 50 mg by mouth in the morning and at bedtime.   ondansetron  8 MG tablet Commonly known as: ZOFRAN  Take 1 tablet (8 mg total) by mouth 3 (three) times daily as needed.   OVER THE COUNTER MEDICATION Apply 1 application. topically daily as needed (Migraine). Pepperment oil   Ozempic (2 MG/DOSE) 8 MG/3ML Sopn Generic drug: Semaglutide (2 MG/DOSE) Inject 2 mg into the skin once a week.   pantoprazole  20 MG tablet Commonly known as: PROTONIX  Take 1 tablet (20 mg total) by mouth 2 (two) times daily.   rosuvastatin  20 MG tablet Commonly known as: CRESTOR  Take 20 mg by mouth at bedtime.   simethicone  80 MG chewable tablet Commonly known as: MYLICON Chew 1 tablet (80 mg total) by mouth 4 (four) times daily as needed for flatulence.        Follow-up Information     Dianah Fort, [place]. Schedule an appointment as soon as possible for a visit in 1 week(s).   Specialty: Physician Assistant Contact information: [address] [place] [address] [phone]                Discharge Exam: Filed Weights   10/25/23 1254 10/26/23 0315  Weight: 73 kg 70.3 kg   General exam: Appears calm and comfortable  Respiratory system: Clear to auscultation. Respiratory effort normal. Cardiovascular system: S1 & S2 heard, RRR.  Gastrointestinal system: Abdomen is nondistended, soft and nontender.  Central nervous system: Alert and oriented. No focal neurological deficits. Extremities: Symmetric 5 x 5 power. Skin: No rashes,  Psychiatry:  Mood & affect appropriate.    Condition at discharge: fair  The results of significant diagnostics from this hospitalization (including imaging, microbiology, ancillary and laboratory) are listed below for reference.   Imaging Studies: MR 3D Recon At Scanner Result Date: 10/27/2023 CLINICAL DATA:  Right upper quadrant abdominal pain. Biliary disease suspected. EXAM: MRI ABDOMEN WITHOUT AND WITH CONTRAST (INCLUDING MRCP) TECHNIQUE: Multiplanar multisequence MR imaging of the abdomen was performed both before and after the administration of intravenous contrast. Heavily T2-weighted images of the biliary and pancreatic ducts were obtained, and three-dimensional MRCP images were rendered by post processing. CONTRAST:  7mL GADAVIST  GADOBUTROL  1 MMOL/ML IV SOLN COMPARISON:  CT scan from earlier same day. FINDINGS: Lower chest: Lower chest unremarkable. Hepatobiliary: Heterogeneous perfusion of liver parenchyma on early postcontrast imaging without a discrete mass lesion evident. Loss of signal intensity in the liver parenchyma on out of phase T1 imaging is compatible with steatosis. As noted on CT, there is marked intrahepatic biliary duct dilatation with dilatation of the extrahepatic biliary tree. Common duct measures on the order of  16 mm diameter. Common bile duct in the head of the pancreas measures 13 mm diameter with common bile duct dilatation extending into the ampulla. MRCP imaging shows no evidence for choledocholithiasis. No obstructing lesion is discernible at the ampulla. No wall thickening or hyperenhancement associated with the extrahepatic biliary system. Pancreas: As noted  on CT imaging, there is mild distention of the main pancreatic duct through the head of the pancreas. No evidence for mass lesion in the pancreas no evidence for restricted diffusion in the head of the pancreas. Spleen:  No splenomegaly. No suspicious focal mass lesion. Adrenals/Urinary Tract: Right adrenal gland unremarkable. 17 mm left adrenal nodule shows loss of signal intensity on out of phase T1 imaging consistent with adenoma. No hydronephrosis or enhancing renal mass lesion. Stomach/Bowel: Stomach is unremarkable. No gastric wall thickening. No evidence of outlet obstruction. Duodenum is normally positioned as is the ligament of Treitz. No small bowel or colonic dilatation within the visualized abdomen. Vascular/Lymphatic: No abdominal aortic aneurysm. No abdominal lymphadenopathy Other:  No intraperitoneal free fluid. Musculoskeletal: No focal suspicious marrow enhancement within the visualized bony anatomy. 3 mm focus of T2 hyperintensity with hyperenhancement is identified in the left psoas muscle (see postcontrast image 71 of series 1502), nonspecific but most likely benign. IMPRESSION: 1. Marked intrahepatic and extrahepatic biliary duct dilatation with dilatation of the main pancreatic duct through the head of the pancreas. No evidence for choledocholithiasis or obstructing lesion at the ampulla. No wall thickening or hyperenhancement associated with the extrahepatic biliary system. ERCP may prove helpful to further evaluate. 2. 3-4 mm focus of hyperenhancement identified in the left psoas muscle, potentially a vascular malformation or nerve  sheath tumor, but likely benign. Follow-up MRI in 3-6 months could be used to ensure stability as clinically warranted. 3. Hepatic steatosis. 4. 17 mm left adrenal adenoma. Electronically Signed   By: Donnal Fusi M.D.   On: 10/27/2023 07:33   ECHOCARDIOGRAM COMPLETE Result Date: 10/26/2023    ECHOCARDIOGRAM REPORT   Patient Name:   BRIELYN BARBETTA Date of Exam: 10/26/2023 Medical Rec #:  [id number]  Height:       62.0 in Accession #:    [id number] Weight:       155.0 lb Date of Birth:  01-16-1966 BSA:          1.715 m Patient Age:    57 years   BP:           135/76 mmHg Patient Gender: F          HR:           70 bpm. Exam Location:  Inpatient Procedure: 2D Echo, Cardiac Doppler and Color Doppler Indications:     Chest Pain R07.9  History:         Patient has prior history of Echocardiogram examinations, most                  recent 07/20/2023. Cardiomegaly, Signs/Symptoms:Dyspnea; Risk                  Factors:Current Smoker.  Sonographer:     Terrilee Few RCS Referring Phys:  Leanna Promise DOUTOVA Diagnosing Phys: Ola Berger MD IMPRESSIONS  1. Left ventricular ejection fraction, by estimation, is 60 to 65%. The left ventricle has normal function. The left ventricle has no regional wall motion abnormalities. Left ventricular diastolic parameters were normal.  2. Right ventricular systolic function is normal. The right ventricular size is normal.  3. The mitral valve is normal in structure. Trivial mitral valve regurgitation.  4. The aortic valve is tricuspid. Aortic valve regurgitation is not visualized. Comparison(s): The left ventricular function is unchanged. FINDINGS  Left Ventricle: Left ventricular ejection fraction, by estimation, is 60 to 65%. The left ventricle has normal function. The left ventricle has no regional wall motion  abnormalities. The left ventricular internal cavity size was normal in size. There is  no left ventricular hypertrophy. Left ventricular diastolic parameters were normal. Right  Ventricle: The right ventricular size is normal. Right vetricular wall thickness was not assessed. Right ventricular systolic function is normal. Left Atrium: Left atrial size was normal in size. Right Atrium: Right atrial size was normal in size. Pericardium: There is no evidence of pericardial effusion. Mitral Valve: The mitral valve is normal in structure. Trivial mitral valve regurgitation. Tricuspid Valve: The tricuspid valve is normal in structure. Tricuspid valve regurgitation is trivial. Aortic Valve: The aortic valve is tricuspid. Aortic valve regurgitation is not visualized. Aortic valve peak gradient measures 6.5 mmHg. Pulmonic Valve: The pulmonic valve was normal in structure. Pulmonic valve regurgitation is not visualized. Aorta: The aortic root and ascending aorta are structurally normal, with no evidence of dilitation. IAS/Shunts: No atrial level shunt detected by color flow Doppler.  LEFT VENTRICLE PLAX 2D LVIDd:         3.80 cm   Diastology LVIDs:         2.70 cm   LV e' medial:    9.57 cm/s LV PW:         1.00 cm   LV E/e' medial:  9.3 LV IVS:        0.90 cm   LV e' lateral:   6.53 cm/s LVOT diam:     2.00 cm   LV E/e' lateral: 13.7 LV SV:         55 LV SV Index:   32 LVOT Area:     3.14 cm  RIGHT VENTRICLE             IVC RV S prime:     14.30 cm/s  IVC diam: 1.50 cm TAPSE (M-mode): 1.6 cm LEFT ATRIUM             Index        RIGHT ATRIUM           Index LA diam:        3.20 cm 1.87 cm/m   RA Area:     14.70 cm LA Vol (A2C):   46.2 ml 26.93 ml/m  RA Volume:   35.30 ml  20.58 ml/m LA Vol (A4C):   34.9 ml 20.35 ml/m LA Biplane Vol: 40.4 ml 23.55 ml/m  AORTIC VALVE AV Area (Vmax): 2.26 cm AV Vmax:        127.00 cm/s AV Peak Grad:   6.5 mmHg LVOT Vmax:      91.30 cm/s LVOT Vmean:     60.700 cm/s LVOT VTI:       0.174 m  AORTA Ao Root diam: 2.90 cm Ao Asc diam:  3.00 cm MITRAL VALVE               TRICUSPID VALVE MV Area (PHT): 4.31 cm    TR Peak grad:   15.1 mmHg MV Decel Time: 176 msec    TR  Vmax:        194.00 cm/s MV E velocity: 89.20 cm/s MV A velocity: 78.20 cm/s  SHUNTS MV E/A ratio:  1.14        Systemic VTI:  0.17 m                            Systemic Diam: 2.00 cm Ola Berger MD Electronically signed by Ola Berger MD Signature Date/Time: 10/26/2023/4:58:25 PM    Final (  Updated)    MR ABDOMEN MRCP W WO CONTAST Result Date: 10/26/2023 CLINICAL DATA:  Right upper quadrant abdominal pain. Biliary disease suspected. EXAM: MRI ABDOMEN WITHOUT AND WITH CONTRAST (INCLUDING MRCP) TECHNIQUE: Multiplanar multisequence MR imaging of the abdomen was performed both before and after the administration of intravenous contrast. Heavily T2-weighted images of the biliary and pancreatic ducts were obtained, and three-dimensional MRCP images were rendered by post processing. CONTRAST:  7mL GADAVIST  GADOBUTROL  1 MMOL/ML IV SOLN COMPARISON:  CT scan from earlier same day. FINDINGS: Lower chest: Lower chest unremarkable. Hepatobiliary: Heterogeneous perfusion of liver parenchyma on early postcontrast imaging without a discrete mass lesion evident. Loss of signal intensity in the liver parenchyma on out of phase T1 imaging is compatible with steatosis. As noted on CT, there is marked intrahepatic biliary duct dilatation with dilatation of the extrahepatic biliary tree. Common duct measures on the order of 16 mm diameter. Common bile duct in the head of the pancreas measures 13 mm diameter with common bile duct dilatation extending into the ampulla. MRCP imaging shows no evidence for choledocholithiasis. No obstructing lesion is discernible at the ampulla. No wall thickening or hyperenhancement associated with the extrahepatic biliary system. Pancreas: As noted on CT imaging, there is mild distention of the main pancreatic duct through the head of the pancreas. No evidence for mass lesion in the pancreas no evidence for restricted diffusion in the head of the pancreas. Spleen:  No splenomegaly. No suspicious focal mass  lesion. Adrenals/Urinary Tract: Right adrenal gland unremarkable. 17 mm left adrenal nodule shows loss of signal intensity on out of phase T1 imaging consistent with adenoma. No hydronephrosis or enhancing renal mass lesion. Stomach/Bowel: Stomach is unremarkable. No gastric wall thickening. No evidence of outlet obstruction. Duodenum is normally positioned as is the ligament of Treitz. No small bowel or colonic dilatation within the visualized abdomen. Vascular/Lymphatic: No abdominal aortic aneurysm. No abdominal lymphadenopathy Other:  No intraperitoneal free fluid. Musculoskeletal: No focal suspicious marrow enhancement within the visualized bony anatomy. 3 mm focus of T2 hyperintensity with hyperenhancement is identified in the left psoas muscle (see postcontrast image 71 of series 1502), nonspecific but most likely benign. IMPRESSION: 1. Marked intrahepatic and extrahepatic biliary duct dilatation with dilatation of the main pancreatic duct through the head of the pancreas. No evidence for choledocholithiasis or obstructing lesion at the ampulla. No wall thickening or hyperenhancement associated with the extrahepatic biliary system. ERCP may prove helpful to further evaluate. 2. 3-4 mm focus of hyperenhancement identified in the left psoas muscle, potentially a vascular malformation or nerve sheath tumor, but likely benign. Follow-up MRI in 3-6 months could be used to ensure stability as clinically warranted. 3. Hepatic steatosis. 4. 17 mm left adrenal adenoma. Electronically Signed   By: Donnal Fusi M.D.   On: 10/26/2023 08:37   CT ABDOMEN PELVIS WO CONTRAST Result Date: 10/25/2023 CLINICAL DATA:  Acute abdominal pain EXAM: CT ABDOMEN AND PELVIS WITHOUT CONTRAST TECHNIQUE: Multidetector CT imaging of the abdomen and pelvis was performed following the standard protocol without IV contrast. RADIATION DOSE REDUCTION: This exam was performed according to the departmental dose-optimization program which  includes automated exposure control, adjustment of the mA and/or kV according to patient size and/or use of iterative reconstruction technique. COMPARISON:  02/03/2015 FINDINGS: Lower chest: Unremarkable Hepatobiliary: Cholecystectomy. Common hepatic duct 1.5 cm in diameter on image 22 series 3, formerly about 0.8 cm. Common bile duct 1.1 cm in diameter on image 30 series 3, formerly 0.5 cm. There is  potentially some mild intrahepatic biliary dilatation as well. The biliary dilatation extends all the way to the ampullary region. Pancreas: Mild dorsal pancreatic duct dilatation distally, up to 4 mm in diameter on image 54 series 6. Spleen: Unremarkable Adrenals/Urinary Tract: 1.5 by 1.8 cm mass of the medial limb of the left adrenal gland with internal density -1 Hounsfield units, compatible with adrenal adenoma. No further imaging workup of this lesion is indicated. Otherwise unremarkable. Stomach/Bowel: Unremarkable Vascular/Lymphatic: Atherosclerosis is present, including aortoiliac atherosclerotic disease. Reproductive: Uterus absent. Other: No supplemental non-categorized findings. Musculoskeletal: Disc bulges at L3-4 and L4-5. IMPRESSION: 1. Biliary dilatation extending all the way to the ampullary region, with mild distal pancreatic duct dilatation. Although some of this may be a physiologic response to prior cholecystectomy, this biliary prominence was not present on 02/03/2015 despite that also being a post cholecystectomy examination. Possibility of obstruction in the vicinity of the ampulla or distal pancreatic head is not excluded. Consider MRI abdomen with and without contrast with special attention to the pancreas ampulla, to include MRCP for further characterization. 2. Benign left adrenal adenoma. No further imaging workup of this lesion is indicated. 3. Disc bulges at L3-4 and L4-5. 4. Aortic atherosclerosis. Electronically Signed   By: Freida Jes M.D.   On: 10/25/2023 18:38   DG Chest 2  View Result Date: 10/25/2023 CLINICAL DATA:  Chest pain and pressure. EXAM: CHEST - 2 VIEW COMPARISON:  Chest radiograph dated 06/30/2021. FINDINGS: Right-sided Port-A-Cath with tip at the cavoatrial junction. No focal consolidation, pleural effusion, or pneumothorax. The cardiac silhouette is within normal limits. No acute osseous pathology. IMPRESSION: No active cardiopulmonary disease. Electronically Signed   By: Angus Bark M.D.   On: 10/25/2023 15:16    Microbiology: Results for orders placed or performed during the hospital encounter of 10/25/23  Culture, blood (Routine x 2)     Status: None   Collection Time: 10/25/23  1:00 PM   Specimen: BLOOD RIGHT HAND  Result Value Ref Range Status   Specimen Description BLOOD RIGHT HAND  Final   Special Requests   Final    BOTTLES DRAWN AEROBIC AND ANAEROBIC Blood Culture results may not be optimal due to an inadequate volume of blood received in culture bottles   Culture   Final    NO GROWTH 5 DAYS Performed at [facility], [address] [place], [place] [address]    Report Status 10/30/2023 FINAL  Final  Culture, blood (Routine x 2)     Status: None   Collection Time: 10/25/23  7:50 PM   Specimen: BLOOD RIGHT WRIST  Result Value Ref Range Status   Specimen Description BLOOD RIGHT WRIST  Final   Special Requests   Final    BOTTLES DRAWN AEROBIC ONLY Blood Culture results may not be optimal due to an inadequate volume of blood received in culture bottles   Culture   Final    NO GROWTH 5 DAYS Performed at [facility], [address], [place], [place] [address]    Report Status 10/30/2023 FINAL  Final  Gastrointestinal Panel by PCR , Stool     Status: None   Collection Time: 10/25/23  9:33 PM   Specimen: Rectum; Stool  Result Value Ref Range Status   Campylobacter species NOT DETECTED NOT DETECTED Final   Plesimonas shigelloides NOT DETECTED NOT DETECTED Final   Salmonella species NOT DETECTED NOT DETECTED Final    Yersinia enterocolitica NOT DETECTED NOT DETECTED Final   Vibrio  species NOT DETECTED NOT DETECTED Final   Vibrio cholerae NOT DETECTED NOT DETECTED Final   Enteroaggregative E coli (EAEC) NOT DETECTED NOT DETECTED Final   Enteropathogenic E coli (EPEC) NOT DETECTED NOT DETECTED Final   Enterotoxigenic E coli (ETEC) NOT DETECTED NOT DETECTED Final   Shiga like toxin producing E coli (STEC) NOT DETECTED NOT DETECTED Final   Shigella/Enteroinvasive E coli (EIEC) NOT DETECTED NOT DETECTED Final   Cryptosporidium NOT DETECTED NOT DETECTED Final   Cyclospora cayetanensis NOT DETECTED NOT DETECTED Final   Entamoeba histolytica NOT DETECTED NOT DETECTED Final   Giardia lamblia NOT DETECTED NOT DETECTED Final   Adenovirus F40/41 NOT DETECTED NOT DETECTED Final   Astrovirus NOT DETECTED NOT DETECTED Final   Norovirus GI/GII NOT DETECTED NOT DETECTED Final   Rotavirus A NOT DETECTED NOT DETECTED Final   Sapovirus (I, II, IV, and V) NOT DETECTED NOT DETECTED Final    Comment: Performed at [facility], [address] [place], [place] [address]  C Difficile Quick Screen w PCR reflex     Status: None   Collection Time: 10/27/23  1:54 PM   Specimen: STOOL  Result Value Ref Range Status   C Diff antigen NEGATIVE NEGATIVE Final   C Diff toxin NEGATIVE NEGATIVE Final   C Diff interpretation No C. difficile detected.  Final    Comment: Performed at [facility], [address] [place], [place] [address]    Labs: CBC: Recent Labs  Lab 10/25/23 1404 10/26/23 0023 10/26/23 0654 10/27/23 0500  WBC 14.3*  --  8.3 8.2  NEUTROABS  --   --   --  4.2  HGB 18.6* 15.0 15.2* 14.5  HCT 52.1* 44.0 43.0 40.9  MCV 89.8  --  90.0 90.5  PLT 400  --  321 316   Basic Metabolic Panel: Recent Labs  Lab 10/25/23 1404 10/25/23 1950 10/26/23 0023 10/26/23 0654 10/26/23 2102 10/27/23 0500 10/28/23 0908 10/30/23 0308  NA 135  --  138 138  --  138 140 142  K 3.3*  --  3.0* 2.9* 2.8* 3.3*  3.4* 3.5  CL 97*  --   --  103  --  100 101 102  CO2 22  --   --  26  --  28 29 29   GLUCOSE 100*  --   --  88  --  85 78 97  BUN 10  --   --  11  --  5* 6 10  CREATININE 1.10*  --   --  0.90  --  0.68 0.77 0.75  CALCIUM  10.5*  --   --  9.0  --  9.0 9.5 9.2  MG  --  2.2  --  2.0  --   --   --   --   PHOS  --  3.9  --  3.5  --   --   --   --    Liver Function Tests: Recent Labs  Lab 10/25/23 1404 10/26/23 0654 10/27/23 0500  AST 21 23 20   ALT 22 25 21   ALKPHOS 77 60 60  BILITOT 0.6 0.9 0.7  PROT 7.7 6.0* 6.1*  ALBUMIN  4.9 3.5 3.5   CBG: Recent Labs  Lab 10/30/23 1751 10/30/23 1923  GLUCAP 62* 74    Discharge time spent: 40 minutes.   Signed: Feliciana Horn, MD [facility] 10/31/2023

## 2023-11-01 ENCOUNTER — Encounter: Payer: Self-pay | Admitting: Pediatrics

## 2023-11-01 LAB — SURGICAL PATHOLOGY

## 2023-11-01 NOTE — Telephone Encounter (Signed)
I have spoken to patient about appointment. She has changed appointment to 01/02/24 at 4 pm to better accommodate her schedule.

## 2023-11-15 DIAGNOSIS — J302 Other seasonal allergic rhinitis: Secondary | ICD-10-CM | POA: Diagnosis not present

## 2023-11-15 DIAGNOSIS — G8929 Other chronic pain: Secondary | ICD-10-CM | POA: Diagnosis not present

## 2023-11-15 DIAGNOSIS — Z720 Tobacco use: Secondary | ICD-10-CM | POA: Diagnosis not present

## 2023-11-15 DIAGNOSIS — E785 Hyperlipidemia, unspecified: Secondary | ICD-10-CM | POA: Diagnosis not present

## 2023-11-15 DIAGNOSIS — R7303 Prediabetes: Secondary | ICD-10-CM | POA: Diagnosis not present

## 2023-11-15 DIAGNOSIS — K580 Irritable bowel syndrome with diarrhea: Secondary | ICD-10-CM | POA: Diagnosis not present

## 2023-11-15 DIAGNOSIS — I10 Essential (primary) hypertension: Secondary | ICD-10-CM | POA: Diagnosis not present

## 2023-11-24 ENCOUNTER — Ambulatory Visit: Payer: 59 | Attending: Cardiology | Admitting: Cardiology

## 2023-11-24 DIAGNOSIS — M51362 Other intervertebral disc degeneration, lumbar region with discogenic back pain and lower extremity pain: Secondary | ICD-10-CM | POA: Diagnosis not present

## 2023-11-25 ENCOUNTER — Encounter: Payer: Self-pay | Admitting: Cardiology

## 2023-12-01 DIAGNOSIS — M170 Bilateral primary osteoarthritis of knee: Secondary | ICD-10-CM | POA: Diagnosis not present

## 2023-12-01 DIAGNOSIS — M25562 Pain in left knee: Secondary | ICD-10-CM | POA: Diagnosis not present

## 2023-12-09 DIAGNOSIS — G90521 Complex regional pain syndrome I of right lower limb: Secondary | ICD-10-CM | POA: Diagnosis not present

## 2023-12-12 ENCOUNTER — Encounter (HOSPITAL_COMMUNITY): Payer: Self-pay

## 2023-12-13 ENCOUNTER — Telehealth (HOSPITAL_COMMUNITY): Payer: Self-pay | Admitting: Emergency Medicine

## 2023-12-13 NOTE — Telephone Encounter (Signed)
Attempted to call patient regarding upcoming cardiac PET appointment. Left message on voicemail with name and callback number Aidan Moten RN Navigator Cardiac Imaging [facility] [phone] Office [phone] Cell  

## 2023-12-14 ENCOUNTER — Encounter (HOSPITAL_COMMUNITY): Admission: RE | Admit: 2023-12-14 | Payer: 59 | Source: Ambulatory Visit

## 2024-01-01 NOTE — Progress Notes (Addendum)
 [facility] Return Visit   Referring Provider Dianah Fort, PA [address] [place],  [place] [address]  Primary Care Provider Dianah Fort, [place]  Patient Profile: [name] is a 58 y.o. female with a past medical history noteworthy for anxiety, depression, endometriosis, DJD, HTN, HLD, seizures, migraines, chronic regional pain syndrome, opioid dependence, pericardial effusion/cardiac tamponade due to COVID who returns to the [facility] for follow-up of the problem(s) noted below.  Problem List: IBS-D GERD History of peptic ulcer disease-duodenal ulcers on EGD 2016 Biliary duct dilation status post cholecystectomy Family history of colorectal cancer in 2 second-degree relatives-2 paternal uncles in their 71s Hepatic steatosis  History of Present Illness   [name] was last seen during inpatient hospitalization to 2025   Current GI Meds  Dicyclomine  20 mg p.o. 4 times daily Imodium  2 mg p.o. 4 times daily Reglan  5 mg p.o. 3 times daily before meals Pantoprazole  20 mg p.o. twice daily Ondansetron  8 mg p.o. 4 times daily as needed for nausea Also on: Amitriptyline  100 mg p.o. nightly and gabapentin  600 mg p.o. 3 times daily for other conditions  Interval History  -- [name] was hospitalized at [facility] 10/25/2023 - 10/31/2023 for symptoms of nausea, vomiting, diarrhea, abdominal pain and electrolyte abnormalities -- Stool studies negative for enteric pathogens -- CT and MR imaging showed biliary ductal dilatation to the ampullary region with mild distal pancreatic duct dilation -reviewed with advanced endoscopy and felt to be postcholecystectomy changes -- EGD showed mild gastritis without evidence of H. pylori infection -- Managed conservatively with IV fluids, ondansetron , PPI; started dicyclomine   -- At today's visit, [name] reports that her acute symptoms leading to hospitalization have resolved and she is back to baseline  symptoms -- Reports daily abdominal pain and cramping, particularly after meals -- Has 1-9 bowel movements a day that are typically loose in nature -- On occasion has nocturnal bowel movements depending upon what she eats -- Avoids [place] ice's, coffee and caffeine  -- On dicyclomine  20 mg p.o. 4 times daily + 4 Imodium  daily -- Despite the use of antispasmodics and antidiarrheals has still had episodes of diarrhea -- Experiences nausea approximately twice a week -- Symptoms of back quality of life and limit her ability to go out to eat  -- Reports that her symptoms are often linked to stress and she has been under stress related to a potential future move -- Sees her psychiatrist every 6 weeks and is attempting to connect with a therapist  -- MRCP disclosed evidence of hepatic steatosis; LFTs normal  -- Believes her last colonoscopy was performed in 2019/2020 and was normal -- Has family history of colorectal cancer in 2 second-degree relatives-2 paternal uncles both in them 21s  Last colonoscopy: 10/30/2023 -mild gastritis Last endoscopy: Patient reports colonoscopy around 2019/2020 by Dr. Elsie Halo - normal  Last Abd CT/CTE/MRE: CTAP 10/2023 -biliary ductal dilation; no bowel abnormalities  GI Review of Symptoms Significant for abdominal pain, cramping, diarrhea and nausea. Otherwise negative.  General Review of Systems  Review of systems is significant for the pertinent positives and negatives as listed per the HPI.  Full ROS is otherwise negative.  Past Medical History   Past Medical History:  Diagnosis Date   Anorexia    Anxiety    Depression    DJD (degenerative joint disease) of cervical spine    Endometriosis    ETOH abuse    sober 3 1/2 years   Hyperlipidemia  Hypertension    Migraines    PICC (peripherally inserted central catheter) in place    rt neck   Psychiatric pseudoseizure    Seizures [facility]      Past Surgical History   Past Surgical History:   Procedure Laterality Date   ABDOMINAL HYSTERECTOMY     ABDOMINAL SURGERY     APPENDECTOMY     BIOPSY  10/30/2023   Procedure: BIOPSY;  Surgeon: Truddie Furrow, MD;  Location: [facility] ENDOSCOPY;  Service: Gastroenterology;;   CARPAL TUNNEL RELEASE     2010   CHOLECYSTECTOMY     ESOPHAGOGASTRODUODENOSCOPY N/A 10/06/2014   Procedure: ESOPHAGOGASTRODUODENOSCOPY (EGD);  Surgeon: Claudette Cue, MD;  Location: [facility] ENDOSCOPY;  Service: Endoscopy;  Laterality: N/A;   ESOPHAGOGASTRODUODENOSCOPY (EGD) WITH PROPOFOL  N/A 10/30/2023   Procedure: ESOPHAGOGASTRODUODENOSCOPY (EGD) WITH PROPOFOL ;  Surgeon: Truddie Furrow, MD;  Location: [facility] ENDOSCOPY;  Service: Gastroenterology;  Laterality: N/A;   KNEE SURGERY     laproscopy     NASAL SINUS SURGERY     PERICARDIOCENTESIS N/A 03/16/2021   Procedure: PERICARDIOCENTESIS;  Surgeon: Cody Das, MD;  Location: [facility] INVASIVE CV LAB;  Service: Cardiovascular;  Laterality: N/A;   SPINAL CORD STIMULATOR REMOVAL N/A 12/18/2021   Procedure: Removal of spinal cord stimulator;  Surgeon: Annis Kinder, MD;  Location: [facility] OR;  Service: Neurosurgery;  Laterality: N/A;  RM 19   VIDEO ASSISTED THORACOSCOPY Right 03/18/2021   Procedure: VIDEO ASSISTED THORACOSCOPY FOR PERICARDIAL WINDOW;  Surgeon: Hilarie Lovely, MD;  Location: [facility] OR;  Service: Thoracic;  Laterality: Right;  pericardial window     Allergies and Medications   Allergies  Allergen Reactions   Aspirin  Other (See Comments)    GI bleed.   Coconut (Cocos Nucifera) Rash   Doxycycline Anaphylaxis and Other (See Comments)    Joint damage also   Gabapentin      Other reaction(s): Abdominal Pain, GI Bleed   Imitrex [Sumatriptan Base] Other (See Comments)    Severe hypertension, all triptans   Iodinated Contrast Media Anaphylaxis   Lactose Diarrhea    Other reaction(s): GI Upset (intolerance)   Lidocaine  Anaphylaxis    Pt states she does well with Marcaine /Bupivicaine without problem   Prednisone   Other (See Comments)    She goes crazy   Sulfa Antibiotics Anaphylaxis   Sulfasalazine Anaphylaxis   Sumatriptan Other (See Comments)    Felt like she was "having a stroke", heart rate and blood pressure "went through the roof"   Levofloxacin Other (See Comments)    Joints hurt   Amoxicillin-Pot Clavulanate Other (See Comments)    Pt can't remember what type of reaction she had, but states her pharmacy has it listed in their allergy list.  12/18/21 Pt has tolerated Ancef  in the past.   Caffeine -Sodium Benzoate     Other reaction(s): Dizziness (intolerance)   Other Other (See Comments)    Artificial sugar - migraine   Prochlorperazine  Other (See Comments)    Tonic/clonic seizure   Latex Rash   Levofloxacin Rash and Other (See Comments)    Joints hurt   Metrizamide Other (See Comments)   Nsaids Rash and Other (See Comments)    GI bleed   Tolmetin Rash    Current Meds  Medication Sig   acetaminophen  (TYLENOL ) 500 MG tablet Take 1,000 mg by mouth every 6 (six) hours as needed for moderate pain (pain score 4-6) or mild pain (pain score 1-3).   amitriptyline  (ELAVIL ) 100 MG tablet Take 100 mg  by mouth at bedtime.   amLODipine  (NORVASC ) 10 MG tablet Take 5 mg by mouth daily.   busPIRone  (BUSPAR ) 15 MG tablet Take 1 tablet (15 mg total) by mouth 3 (three) times daily.   cholecalciferol  (VITAMIN D3) 10 MCG (400 UNIT) TABS tablet Take 1 tablet (400 Units total) by mouth daily.   colestipol  (COLESTID ) 1 g tablet Take 2 tablets (2 g total) by mouth 2 (two) times daily.   cyanocobalamin  (VITAMIN B12) 500 MCG tablet Take 1 tablet (500 mcg total) by mouth daily.   diclofenac Sodium (VOLTAREN) 1 % GEL Apply 1 Application topically as needed (pain).   dicyclomine  (BENTYL ) 10 MG capsule Take 1 capsule (10 mg total) by mouth 4 (four) times daily -  before meals and at bedtime.   fluticasone  (FLONASE ) 50 MCG/ACT nasal spray Place 1 spray into both nostrils 3 (three) times daily as needed for  allergies.   gabapentin  (NEURONTIN ) 300 MG capsule Take 2 capsules by mouth 3 (three) times daily.   glycopyrrolate  (ROBINUL ) 1 MG tablet Take 1 tablet (1 mg total) by mouth 2 (two) times daily.   hydrochlorothiazide  (HYDRODIURIL ) 25 MG tablet Take 25 mg by mouth daily.   lamoTRIgine  (LAMICTAL ) 25 MG tablet Take 1 tablet (25 mg total) by mouth daily.   loperamide  (IMODIUM ) 2 MG capsule Take 2-4 mg by mouth as needed for diarrhea or loose stools.   Melatonin 10 MG TABS Take 10 mg by mouth at bedtime.   methocarbamol  (ROBAXIN ) 750 MG tablet Take 1 tablet (750 mg total) by mouth every 8 (eight) hours as needed for muscle spasms. (Patient taking differently: Take 1,500 mg by mouth every 8 (eight) hours as needed for muscle spasms.)   metoCLOPramide  (REGLAN ) 5 MG tablet Take 1 tablet (5 mg total) by mouth 3 (three) times daily before meals.   Multiple Vitamin (MULTIVITAMIN WITH MINERALS) TABS tablet Take 1 tablet by mouth daily. Woman 50+ advance   NUCYNTA  ER 50 MG 12 hr tablet Take 50 mg by mouth in the morning and at bedtime.   ondansetron  (ZOFRAN ) 8 MG tablet Take 1 tablet (8 mg total) by mouth 3 (three) times daily as needed.   OVER THE COUNTER MEDICATION Apply 1 application. topically daily as needed (Migraine). Pepperment oil   OZEMPIC, 2 MG/DOSE, 8 MG/3ML SOPN Inject 2 mg into the skin once a week.   pantoprazole  (PROTONIX ) 20 MG tablet Take 1 tablet (20 mg total) by mouth 2 (two) times daily.   simethicone  (MYLICON) 80 MG chewable tablet Chew 1 tablet (80 mg total) by mouth 4 (four) times daily as needed for flatulence.   tapentadol  (NUCYNTA ) 50 MG tablet Take 50 mg by mouth 3 (three) times daily as needed for moderate pain (pain score 4-6) or severe pain (pain score 7-10).    Family History   Family History  Problem Relation Age of Onset   Hypertension Mother    Hyperlipidemia Mother    Heart failure Father    Heart attack Father    Cancer Other    Hypertension Half-Sister     Hyperlipidemia Half-Sister    Hypertension Half-Sister    Hyperlipidemia Half-Sister     Social History   Social History   Tobacco Use   Smoking status: Every Day    Current packs/day: 0.00    Average packs/day: 0.2 packs/day for 20.0 years (3.0 ttl pk-yrs)    Types: Cigarettes    Start date: 12/14/2001    Last attempt to quit: 12/14/2021  Years since quitting: 2.0   Smokeless tobacco: Never   Tobacco comments:    07/19/2023 patient smokes about 5 cigarettes daily  Vaping Use   Vaping status: Never Used  Substance Use Topics   Alcohol use: Not Currently    Comment: sober since 2011   Drug use: Not Currently   Jesseca reports that she has been smoking cigarettes. She started smoking about 22 years ago. She has a 3 pack-year smoking history. She has never used smokeless tobacco. She reports that she does not currently use alcohol. She reports that she does not currently use drugs.  Vital Signs and Physical Examination   Vitals:   01/02/24 1540  BP: 116/80  Pulse: 100   Body mass index is 30.87 kg/m. Weight: 163 lb 6 oz (74.1 kg)  General: Well developed, well nourished, no acute distress Head: Normocephalic and atraumatic Eyes: Sclerae anicteric, EOMI Lungs: Clear throughout to auscultation Heart: Regular rate and rhythm; No murmurs, rubs or bruits Abdomen: Soft, tender to palpation in bilateral lower quadrants, left greater than right, and non distended. No masses, hepatosplenomegaly or hernias noted. Normal Bowel sounds Rectal: Deferred Musculoskeletal: Symmetrical with no gross deformities   Review of Data  The following data was reviewed at the time of this encounter:  Laboratory Studies      Latest Ref Rng & Units 10/27/2023    5:00 AM 10/26/2023    6:54 AM 10/26/2023   12:23 AM  CBC  WBC 4.0 - 10.5 K/uL 8.2  8.3    Hemoglobin 12.0 - 15.0 g/dL 16.1  09.6  04.5   Hematocrit 36.0 - 46.0 % 40.9  43.0  44.0   Platelets 150 - 400 K/uL 316  321      Lab Results   Component Value Date   LIPASE 27 10/25/2023      Latest Ref Rng & Units 10/30/2023    3:08 AM 10/28/2023    9:08 AM 10/27/2023    5:00 AM  CMP  Glucose 70 - 99 mg/dL 97  78  85   BUN 6 - 20 mg/dL 10  6  5    Creatinine 0.44 - 1.00 mg/dL 4.09  8.11  9.14   Sodium 135 - 145 mmol/L 142  140  138   Potassium 3.5 - 5.1 mmol/L 3.5  3.4  3.3   Chloride 98 - 111 mmol/L 102  101  100   CO2 22 - 32 mmol/L 29  29  28    Calcium  8.9 - 10.3 mg/dL 9.2  9.5  9.0   Total Protein 6.5 - 8.1 g/dL   6.1   Total Bilirubin 0.0 - 1.2 mg/dL   0.7   Alkaline Phos 38 - 126 U/L   60   AST 15 - 41 U/L   20   ALT 0 - 44 U/L   21    Lab Results  Component Value Date   TSH 0.502 10/26/2023   Folate 8.3  Imaging Studies  MRI/MRCP 10/27/2023 1. Marked intrahepatic and extrahepatic biliary duct dilatation with dilatation of the main pancreatic duct through the head of the pancreas. No evidence for choledocholithiasis or obstructing lesion at the ampulla. No wall thickening or hyperenhancement associated with the extrahepatic biliary system. ERCP may prove helpful to further evaluate. 2. 3-4 mm focus of hyperenhancement identified in the left psoas muscle, potentially a vascular malformation or nerve sheath tumor, but likely benign. Follow-up MRI in 3-6 months could be used to ensure stability as clinically warranted.  3. Hepatic steatosis. 4. 17 mm left adrenal adenoma.  CTAP 10/25/2023 1. Biliary dilatation extending all the way to the ampullary region, with mild distal pancreatic duct dilatation. Although some of this may be a physiologic response to prior cholecystectomy, this biliary prominence was not present on 02/03/2015 despite that also being a post cholecystectomy examination. Possibility of obstruction in the vicinity of the ampulla or distal pancreatic head is not excluded. Consider MRI abdomen with and without contrast with special attention to the pancreas ampulla, to include MRCP for  further characterization. 2. Benign left adrenal adenoma. No further imaging workup of this lesion is indicated. 3. Disc bulges at L3-4 and L4-5. 4. Aortic atherosclerosis.      GI Procedures and Studies  EGD 10/2023 Mild gastritis, otherwise normal  Colonoscopy 2019/2020 - per pt report Normal  EGD 09/2014 3 ulcers in the duodenal bulb, largest measuring 1 cm, o/w normal  Clinical Impression  It is my clinical impression that Ms. Kresse is a 58 y.o. female with;  IBS-D GERD History of peptic ulcer disease-duodenal ulcers on EGD 2016 Biliary duct dilation status post cholecystectomy Family history of colorectal cancer in 2 second-degree relatives-2 paternal uncles in their 76s Hepatic steatosis  Ms. Ilg carries a diagnosis of IBS-D and was recently hospitalized at [facility] for symptoms of abdominal pain, nausea, vomiting, diarrhea and electrolyte abnormalities.  Stool studies were negative for infectious pathogens but it was suspected that she may have incurred an enteric pathogen with resultant exacerbation of her IBS-D.  EGD during her hospitalization showed H. pylori negative gastritis.  Abdominal imaging did not show any bowel abnormalities but there was biliary ductal dilation on both CT and MRCP.  These findings were reviewed with one of our advanced endoscopist who felt that the changes were most consistent with postcholecystectomy anatomy.  She presents to the office today for follow-up of her abdominal pain and diarrhea diagnosed as presumptive IBS-D.  She has been followed by gastroenterologists in the past although we do not have all of her records to review regarding her prior workup.  Thyroid  studies and folate performed in the hospital were normal.  It does not appear she has had serologic testing for celiac disease in the recent past and this could be considered.  She is currently managing her symptoms with dicyclomine  and Imodium .  We discussed that some of her  symptomatology could be related to bile acid diarrhea secondary to cholecystectomy and offered a trial of bile acid sequestrants which she is interested in.  Also suggested that we could consider transitioning from dicyclomine  to glycopyrrolate  to see if it makes a difference which she is also amenable to.  Noteworthy that for other conditions she is currently taking amitriptyline , gabapentin  and Nucynta  but has abdominal pain despite these modalities.  Solena is very in tune with her body and has been careful to modify her diet to avoid food triggers.  She also acknowledges that stress contributes to some of her GI symptoms and is actively engaged in mental health care.  Plan  Discontinue dicyclomine  and trial glycopyrrolate  1 mg p.o. twice daily.  If needed this can be dose escalated to 2 mg p.o. twice daily Start Colestid  2 g p.o. twice daily.  This can be further dose escalated as needed. Continue Imodium  as needed If diarrhea persists also consider trial of Lomotil Consider celiac testing in the future Continue metoclopramide  5 mg p.o. 4 times daily Continue ondansetron  8 mg p.o. every 8 hours as needed  for nausea Request outside records from [facility], particularly last colonoscopy report Monitor liver enzymes in the setting of hepatic steatosis -pending clinical course can consider need for evaluation of chronic hepatitides Continue multidisciplinary care engaging mental health care providers  Notation made that Hoye has difficult IV access and has a port because of this.  When need arises for her to have procedures review with staff procedure locations where port can be accessed.  Planned Follow Up 2-4 months  The patient or caregiver verbalized understanding of the material covered, with no barriers to understanding. All questions were answered. Patient or caregiver is agreeable with the plan outlined above.    It was a pleasure to see [name].  If you have any questions or  concerns regarding this evaluation, do not hesitate to contact me.  Eugenia Hess, MD [facility]   I spent total of 45 minutes in both face-to-face (20 min interview) and non-face-to-face activities (chart review, coordination of care, clinical documentation), excluding procedures performed, for the visit on the date of this encounter.  ADDENDUM: Outside records obtained from [facility]  Colonoscopy 11/12/2019 Three 3-6 mm polyps in the cecum, sigmoid and rectum; random biopsies performed for microscopic colitis Path: Tubular adenoma, hyperplastic polyp x 2; normal colonic mucosa without microscopic colitis Appropriate interval for next colonoscopy 7 years-2028

## 2024-01-02 ENCOUNTER — Encounter: Payer: Self-pay | Admitting: Pediatrics

## 2024-01-02 ENCOUNTER — Ambulatory Visit (INDEPENDENT_AMBULATORY_CARE_PROVIDER_SITE_OTHER): Payer: 59 | Admitting: Pediatrics

## 2024-01-02 ENCOUNTER — Ambulatory Visit: Payer: 59 | Admitting: Pediatrics

## 2024-01-02 VITALS — BP 116/80 | HR 100 | Ht 61.0 in | Wt 163.4 lb

## 2024-01-02 DIAGNOSIS — K219 Gastro-esophageal reflux disease without esophagitis: Secondary | ICD-10-CM | POA: Diagnosis not present

## 2024-01-02 DIAGNOSIS — K838 Other specified diseases of biliary tract: Secondary | ICD-10-CM | POA: Diagnosis not present

## 2024-01-02 DIAGNOSIS — Z800 Family history of malignant neoplasm of digestive organs: Secondary | ICD-10-CM | POA: Diagnosis not present

## 2024-01-02 DIAGNOSIS — K580 Irritable bowel syndrome with diarrhea: Secondary | ICD-10-CM

## 2024-01-02 DIAGNOSIS — Z8711 Personal history of peptic ulcer disease: Secondary | ICD-10-CM | POA: Diagnosis not present

## 2024-01-02 DIAGNOSIS — Z9049 Acquired absence of other specified parts of digestive tract: Secondary | ICD-10-CM

## 2024-01-02 DIAGNOSIS — K760 Fatty (change of) liver, not elsewhere classified: Secondary | ICD-10-CM

## 2024-01-02 MED ORDER — COLESTIPOL HCL 1 G PO TABS: 2.0000 g | ORAL_TABLET | Freq: Two times a day (BID) | ORAL | 3 refills | Status: DC

## 2024-01-02 MED ORDER — GLYCOPYRROLATE 1 MG PO TABS: 1.0000 mg | ORAL_TABLET | Freq: Two times a day (BID) | ORAL | 3 refills | Status: AC

## 2024-01-02 NOTE — Patient Instructions (Signed)
 We have sent the following medications to your pharmacy for you to pick up at your convenience: Colestid 2g twice a day  Glycopyrrolate 1mg  twice a day  Thank you for entrusting me with your care and for choosing [facility], Dr. Eugenia Hess   _______________________________________________________  If your blood pressure at your visit was 140/90 or greater, please contact your primary care physician to follow up on this.  _______________________________________________________  If you are age 46 or older, your body mass index should be between 23-30. Your Body mass index is 30.87 kg/m. If this is out of the aforementioned range listed, please consider follow up with your Primary Care Provider.  If you are age 15 or younger, your body mass index should be between 19-25. Your Body mass index is 30.87 kg/m. If this is out of the aformentioned range listed, please consider follow up with your Primary Care Provider.   ________________________________________________________  The [facility] providers would like to encourage you to use MYCHART to communicate with providers for non-urgent requests or questions.  Due to long hold times on the telephone, sending your provider a message by [facility] may be a faster and more efficient way to get a response.  Please allow 48 business hours for a response.  Please remember that this is for non-urgent requests.  _______________________________________________________

## 2024-01-02 NOTE — Progress Notes (Signed)
   01/02/2024  Patient ID: [name], female   DOB: December 05, 1965, 58 y.o.   MRN: [id number]  Contacted patient regarding medication adherence from a quality report for [facility]. The patient failed MAD in 2024.     Per [facility] and payor portal fill history: Rosuvastatin 20 mg - last filled 10/08/23 for a 90-day supply.  Ozempic 2 mg - last filled 09/15/23 for a 28-day supply.     I will follow up for adherence monitoring and to address GLP-1 nonadherence. Patient may need medication assistance, will call patient to follow-up on PAP.   Thank you for allowing pharmacy to be a part of this patient's care.    Livia Riffle, PharmD Clinical Pharmacist  [phone]

## 2024-01-04 ENCOUNTER — Telehealth: Payer: Self-pay

## 2024-01-04 DIAGNOSIS — M1712 Unilateral primary osteoarthritis, left knee: Secondary | ICD-10-CM | POA: Diagnosis not present

## 2024-01-04 NOTE — Telephone Encounter (Signed)
-----   Message from Truddie Furrow sent at 01/03/2024  8:16 PM EDT ----- Regarding: [facility] I saw Ms. Hnat in the office this week.  She was previously a patient of [facility].  Please contact [facility] to obtain her prior records -clinic notes, imaging, endoscopic procedures and pathology.  Thanks,  Haskell Linker

## 2024-01-05 NOTE — Telephone Encounter (Signed)
 Records request sent to [facility].

## 2024-01-24 DIAGNOSIS — M1712 Unilateral primary osteoarthritis, left knee: Secondary | ICD-10-CM | POA: Diagnosis not present

## 2024-01-26 ENCOUNTER — Encounter: Payer: Self-pay | Admitting: Pediatrics

## 2024-01-31 ENCOUNTER — Other Ambulatory Visit: Payer: Self-pay | Admitting: Pediatrics

## 2024-01-31 DIAGNOSIS — K580 Irritable bowel syndrome with diarrhea: Secondary | ICD-10-CM | POA: Diagnosis not present

## 2024-01-31 DIAGNOSIS — R7303 Prediabetes: Secondary | ICD-10-CM | POA: Diagnosis not present

## 2024-01-31 DIAGNOSIS — G8929 Other chronic pain: Secondary | ICD-10-CM | POA: Diagnosis not present

## 2024-01-31 DIAGNOSIS — Z720 Tobacco use: Secondary | ICD-10-CM | POA: Diagnosis not present

## 2024-01-31 DIAGNOSIS — E785 Hyperlipidemia, unspecified: Secondary | ICD-10-CM | POA: Diagnosis not present

## 2024-01-31 DIAGNOSIS — J302 Other seasonal allergic rhinitis: Secondary | ICD-10-CM | POA: Diagnosis not present

## 2024-01-31 DIAGNOSIS — Z0001 Encounter for general adult medical examination with abnormal findings: Secondary | ICD-10-CM | POA: Diagnosis not present

## 2024-01-31 DIAGNOSIS — I10 Essential (primary) hypertension: Secondary | ICD-10-CM | POA: Diagnosis not present

## 2024-02-29 DIAGNOSIS — G90521 Complex regional pain syndrome I of right lower limb: Secondary | ICD-10-CM | POA: Diagnosis not present

## 2024-02-29 DIAGNOSIS — M5416 Radiculopathy, lumbar region: Secondary | ICD-10-CM | POA: Diagnosis not present

## 2024-03-01 DIAGNOSIS — I10 Essential (primary) hypertension: Secondary | ICD-10-CM | POA: Diagnosis not present

## 2024-03-01 DIAGNOSIS — R7303 Prediabetes: Secondary | ICD-10-CM | POA: Diagnosis not present

## 2024-03-01 DIAGNOSIS — Z0000 Encounter for general adult medical examination without abnormal findings: Secondary | ICD-10-CM | POA: Diagnosis not present

## 2024-03-01 DIAGNOSIS — K580 Irritable bowel syndrome with diarrhea: Secondary | ICD-10-CM | POA: Diagnosis not present

## 2024-03-01 DIAGNOSIS — G8929 Other chronic pain: Secondary | ICD-10-CM | POA: Diagnosis not present

## 2024-03-01 DIAGNOSIS — E785 Hyperlipidemia, unspecified: Secondary | ICD-10-CM | POA: Diagnosis not present

## 2024-03-01 DIAGNOSIS — J302 Other seasonal allergic rhinitis: Secondary | ICD-10-CM | POA: Diagnosis not present

## 2024-03-01 DIAGNOSIS — Z720 Tobacco use: Secondary | ICD-10-CM | POA: Diagnosis not present

## 2024-03-07 DIAGNOSIS — M51362 Other intervertebral disc degeneration, lumbar region with discogenic back pain and lower extremity pain: Secondary | ICD-10-CM | POA: Diagnosis not present

## 2024-03-19 ENCOUNTER — Other Ambulatory Visit (HOSPITAL_COMMUNITY): Payer: Self-pay

## 2024-03-30 ENCOUNTER — Other Ambulatory Visit: Payer: Self-pay | Admitting: Gastroenterology

## 2024-04-04 ENCOUNTER — Ambulatory Visit: Admitting: Gastroenterology

## 2024-04-25 ENCOUNTER — Other Ambulatory Visit (HOSPITAL_COMMUNITY): Payer: Self-pay

## 2024-04-30 NOTE — Progress Notes (Deleted)
 Chief Complaint: follow-up abdominal pain, diarrhea Primary GI Doctor:Dr. Suzann  Patient Profile: [name] is a 58 y.o. female with a past medical history noteworthy for anxiety, depression, endometriosis, DJD, HTN, HLD, seizures, migraines, chronic regional pain syndrome, opioid dependence, pericardial effusion/cardiac tamponade due to COVID who returns to the [facility] for follow-up of the problem(s) noted below.   Problem List: IBS-D GERD History of peptic ulcer disease-duodenal ulcers on EGD 2016 Biliary duct dilation status post cholecystectomy Family history of colorectal cancer in 2 second-degree relatives-2 paternal uncles in their 73s Hepatic steatosis  History of Present Illness   [name] was last seen in GI office on 01/02/24 by Dr. Suzann.  Current GI Meds  Dicyclomine  20 mg p.o. 4 times daily Imodium  2 mg p.o. 4 times daily Reglan  5 mg p.o. 3 times daily before meals Pantoprazole  20 mg p.o. twice daily Ondansetron  8 mg p.o. 4 times daily as needed for nausea Also on: Amitriptyline  100 mg p.o. nightly and gabapentin  600 mg p.o. 3 times daily for other conditions   Interval History  -- [place] was hospitalized at [facility] 10/25/2023 - 10/31/2023 for symptoms of nausea, vomiting, diarrhea, abdominal pain and electrolyte abnormalities -- Stool studies negative for enteric pathogens -- CT and MR imaging showed biliary ductal dilatation to the ampullary region with mild distal pancreatic duct dilation -reviewed with advanced endoscopy and felt to be postcholecystectomy changes -- EGD showed mild gastritis without evidence of H. pylori infection -- Managed conservatively with IV fluids, ondansetron , PPI; started dicyclomine   At today's visit, follow-up on abdominal pain and diarrhea.  Discontinue dicyclomine  and trial glycopyrrolate  1 mg p.o. twice daily.  If needed this can be dose escalated to 2 mg p.o. twice daily Start Colestid  2 g p.o. twice  daily.  This can be further dose escalated as needed. Continue Imodium  as needed If diarrhea persists also consider trial of Lomotil Consider celiac testing in the future Continue metoclopramide  5 mg p.o. 4 times daily Continue ondansetron  8 mg p.o. every 8 hours as needed for nausea Request outside records from [place] [facility], particularly last colonoscopy report Monitor liver enzymes in the setting of hepatic steatosis -pending clinical course can consider need for evaluation of chronic hepatitides Continue multidisciplinary care engaging mental health care providers   Patient admits/denies nausea, vomiting, or weight loss  Patient admits/denies altered bowel habits Patient admits/denies abdominal pain Patient admits/denies rectal bleeding  Last endoscopy: 10/30/2023 -mild gastritis Last colonoscopy: colonoscopy w/ [facility] performed in February 2021. This showed 3 polyps -1 polyp was a tubular adenoma which is a precancerous type of polyp. The other 2 polyps were hyperplastic polyps that are entirely benign and have no malignant potential. Recall 7 years.***   Last Abd CT/CTE/MRE: CTAP 10/2023 -biliary ductal dilation; no bowel abnormalities    Wt Readings from Last 3 Encounters:  01/02/24 163 lb 6 oz (74.1 kg)  10/26/23 154 lb 15.7 oz (70.3 kg)  07/19/23 161 lb 12.8 oz (73.4 kg)      Past Medical History:  Diagnosis Date   Anorexia    Anxiety    Depression    DJD (degenerative joint disease) of cervical spine    Endometriosis    ETOH abuse    sober 3 1/2 years   Hyperlipidemia    Hypertension    Migraines    PICC (peripherally inserted central catheter) in place    rt neck   Psychiatric pseudoseizure    Seizures (HCC)  Past Surgical History:  Procedure Laterality Date   ABDOMINAL HYSTERECTOMY     ABDOMINAL SURGERY     APPENDECTOMY     BIOPSY  10/30/2023   Procedure: BIOPSY;  Surgeon: Suzann Inocente HERO, MD;  Location: [facility] ENDOSCOPY;   Service: Gastroenterology;;   CARPAL TUNNEL RELEASE     2010   CHOLECYSTECTOMY     ESOPHAGOGASTRODUODENOSCOPY N/A 10/06/2014   Procedure: ESOPHAGOGASTRODUODENOSCOPY (EGD);  Surgeon: Lamar JONETTA Aho, MD;  Location: [facility] ENDOSCOPY;  Service: Endoscopy;  Laterality: N/A;   ESOPHAGOGASTRODUODENOSCOPY (EGD) WITH PROPOFOL  N/A 10/30/2023   Procedure: ESOPHAGOGASTRODUODENOSCOPY (EGD) WITH PROPOFOL ;  Surgeon: Suzann Inocente HERO, MD;  Location: [facility] ENDOSCOPY;  Service: Gastroenterology;  Laterality: N/A;   KNEE SURGERY     laproscopy     NASAL SINUS SURGERY     PERICARDIOCENTESIS N/A 03/16/2021   Procedure: PERICARDIOCENTESIS;  Surgeon: Elmira Newman PARAS, MD;  Location: [facility] INVASIVE CV LAB;  Service: Cardiovascular;  Laterality: N/A;   SPINAL CORD STIMULATOR REMOVAL N/A 12/18/2021   Procedure: Removal of spinal cord stimulator;  Surgeon: Darlis Deatrice RAMAN, MD;  Location: [facility] OR;  Service: Neurosurgery;  Laterality: N/A;  RM 19   VIDEO ASSISTED THORACOSCOPY Right 03/18/2021   Procedure: VIDEO ASSISTED THORACOSCOPY FOR PERICARDIAL WINDOW;  Surgeon: Shyrl Linnie KIDD, MD;  Location: [facility] OR;  Service: Thoracic;  Laterality: Right;  pericardial window    Current Outpatient Medications  Medication Sig Dispense Refill   acetaminophen  (TYLENOL ) 500 MG tablet Take 1,000 mg by mouth every 6 (six) hours as needed for moderate pain (pain score 4-6) or mild pain (pain score 1-3).     amitriptyline  (ELAVIL ) 100 MG tablet Take 100 mg by mouth at bedtime.     amLODipine  (NORVASC ) 10 MG tablet Take 5 mg by mouth daily.     busPIRone  (BUSPAR ) 15 MG tablet Take 1 tablet (15 mg total) by mouth 3 (three) times daily. 90 tablet 2   cholecalciferol  (VITAMIN D3) 10 MCG (400 UNIT) TABS tablet Take 1 tablet (400 Units total) by mouth daily. 30 tablet 0   colestipol  (COLESTID ) 1 g tablet TAKE 2 TABLETS BY MOUTH 2 TIMES DAILY. 360 tablet 1   cyanocobalamin  (VITAMIN B12) 500 MCG tablet Take 1 tablet (500 mcg total) by mouth daily. 30  tablet 2   diclofenac Sodium (VOLTAREN) 1 % GEL Apply 1 Application topically as needed (pain).     dicyclomine  (BENTYL ) 10 MG capsule Take 1 capsule (10 mg total) by mouth 4 (four) times daily -  before meals and at bedtime. 120 capsule 0   fluticasone  (FLONASE ) 50 MCG/ACT nasal spray Place 1 spray into both nostrils 3 (three) times daily as needed for allergies.     gabapentin  (NEURONTIN ) 300 MG capsule Take 2 capsules by mouth 3 (three) times daily.     glycopyrrolate  (ROBINUL ) 1 MG tablet Take 1 tablet (1 mg total) by mouth 2 (two) times daily. 60 tablet 3   hydrochlorothiazide  (HYDRODIURIL ) 25 MG tablet Take 25 mg by mouth daily.     lamoTRIgine  (LAMICTAL ) 25 MG tablet Take 1 tablet (25 mg total) by mouth daily. 30 tablet 2   loperamide  (IMODIUM ) 2 MG capsule Take 2-4 mg by mouth as needed for diarrhea or loose stools.     Melatonin 10 MG TABS Take 10 mg by mouth at bedtime.     methocarbamol  (ROBAXIN ) 750 MG tablet Take 1 tablet (750 mg total) by mouth every 8 (eight) hours as needed for muscle spasms. (Patient taking differently: Take  1,500 mg by mouth every 8 (eight) hours as needed for muscle spasms.) 90 tablet 2   metoCLOPramide  (REGLAN ) 5 MG tablet Take 1 tablet (5 mg total) by mouth 3 (three) times daily before meals. 90 tablet 0   Multiple Vitamin (MULTIVITAMIN WITH MINERALS) TABS tablet Take 1 tablet by mouth daily. Woman 50+ advance     NUCYNTA  ER 50 MG 12 hr tablet Take 50 mg by mouth in the morning and at bedtime.     ondansetron  (ZOFRAN ) 8 MG tablet Take 1 tablet (8 mg total) by mouth 3 (three) times daily as needed. 20 tablet 1   OVER THE COUNTER MEDICATION Apply 1 application. topically daily as needed (Migraine). Pepperment oil     OZEMPIC, 2 MG/DOSE, 8 MG/3ML SOPN Inject 2 mg into the skin once a week.     pantoprazole  (PROTONIX ) 20 MG tablet TAKE 1 TABLET BY MOUTH TWICE A DAY 60 tablet 2   simethicone  (MYLICON) 80 MG chewable tablet Chew 1 tablet (80 mg total) by mouth 4  (four) times daily as needed for flatulence. 30 tablet 0   tapentadol  (NUCYNTA ) 50 MG tablet Take 50 mg by mouth 3 (three) times daily as needed for moderate pain (pain score 4-6) or severe pain (pain score 7-10).     No current facility-administered medications for this visit.    Allergies as of 05/01/2024 - Review Complete 01/02/2024  Allergen Reaction Noted   Aspirin  Other (See Comments) 09/01/2011   Coconut (cocos nucifera) Rash 03/15/2021   Doxycycline Anaphylaxis and Other (See Comments) 09/01/2011   Gabapentin   03/15/2021   Imitrex [sumatriptan base] Other (See Comments) 09/01/2011   Iodinated contrast media Anaphylaxis 11/04/2013   Lactose Diarrhea 03/03/2019   Lidocaine  Anaphylaxis 09/30/2014   Prednisone  Other (See Comments) 09/01/2011   Sulfa antibiotics Anaphylaxis 09/01/2011   Sulfasalazine Anaphylaxis 09/30/2014   Sumatriptan Other (See Comments) 09/30/2014   Levofloxacin Other (See Comments) 09/01/2011   Amoxicillin-pot clavulanate Other (See Comments) 07/12/2016   Caffeine -sodium benzoate  07/10/2018   Other Other (See Comments) 03/15/2021   Prochlorperazine  Other (See Comments) 07/24/2015   Latex Rash 09/01/2011   Levofloxacin Rash and Other (See Comments) 09/30/2014   Metrizamide Other (See Comments) 09/30/2014   Nsaids Rash and Other (See Comments) 09/01/2011   Tolmetin Rash 09/30/2014    Family History  Problem Relation Age of Onset   Hypertension Mother    Hyperlipidemia Mother    Heart failure Father    Heart attack Father    Cancer Other    Hypertension Half-Sister    Hyperlipidemia Half-Sister    Hypertension Half-Sister    Hyperlipidemia Half-Sister     Review of Systems:    Constitutional: No weight loss, fever, chills, weakness or fatigue HEENT: Eyes: No change in vision               Ears, Nose, Throat:  No change in hearing or congestion Skin: No rash or itching Cardiovascular: No chest pain, chest pressure or palpitations   Respiratory:  No SOB or cough Gastrointestinal: See HPI and otherwise negative Genitourinary: No dysuria or change in urinary frequency Neurological: No headache, dizziness or syncope Musculoskeletal: No new muscle or joint pain Hematologic: No bleeding or bruising Psychiatric: No history of depression or anxiety    Physical Exam:  Vital signs: There were no vitals taken for this visit.  Constitutional:   Pleasant *** female/female appears to be in NAD, Well developed, Well nourished, alert and cooperative Eyes:   PEERL, EOMI.  No icterus. Conjunctiva pink. Neck:  Supple Throat: Oral cavity and pharynx without inflammation, swelling or lesion.  Respiratory: Respirations even and unlabored. Lungs clear to auscultation bilaterally.   No wheezes, crackles, or rhonchi.  Cardiovascular: Normal S1, S2. Regular rate and rhythm. No peripheral edema, cyanosis or pallor.  Gastrointestinal:  Soft, nondistended, nontender. No rebound or guarding. Normal bowel sounds. No appreciable masses or hepatomegaly. Rectal:  Not performed.  Anoscopy: Msk:  Symmetrical without gross deformities. Without edema, no deformity or joint abnormality.  Neurologic:  Alert and  oriented x4;  grossly normal neurologically.  Skin:   Dry and intact without significant lesions or rashes.  RELEVANT LABS AND IMAGING: CBC    Latest Ref Rng & Units 10/27/2023    5:00 AM 10/26/2023    6:54 AM 10/26/2023   12:23 AM  CBC  WBC 4.0 - 10.5 K/uL 8.2  8.3    Hemoglobin 12.0 - 15.0 g/dL 85.4  84.7  84.9   Hematocrit 36.0 - 46.0 % 40.9  43.0  44.0   Platelets 150 - 400 K/uL 316  321       CMP     Latest Ref Rng & Units 10/30/2023    3:08 AM 10/28/2023    9:08 AM 10/27/2023    5:00 AM  CMP  Glucose 70 - 99 mg/dL 97  78  85   BUN 6 - 20 mg/dL 10  6  5    Creatinine 0.44 - 1.00 mg/dL 9.24  9.22  9.31   Sodium 135 - 145 mmol/L 142  140  138   Potassium 3.5 - 5.1 mmol/L 3.5  3.4  3.3   Chloride 98 - 111 mmol/L 102  101  100   CO2 22 - 32 mmol/L 29   29  28    Calcium  8.9 - 10.3 mg/dL 9.2  9.5  9.0   Total Protein 6.5 - 8.1 g/dL   6.1   Total Bilirubin 0.0 - 1.2 mg/dL   0.7   Alkaline Phos 38 - 126 U/L   60   AST 15 - 41 U/L   20   ALT 0 - 44 U/L   21      Lab Results  Component Value Date   TSH 0.502 10/26/2023   Imaging Studies  MRI/MRCP 10/27/2023 1. Marked intrahepatic and extrahepatic biliary duct dilatation with dilatation of the main pancreatic duct through the head of the pancreas. No evidence for choledocholithiasis or obstructing lesion at the ampulla. No wall thickening or hyperenhancement associated with the extrahepatic biliary system. ERCP Raahil Ong prove helpful to further evaluate. 2. 3-4 mm focus of hyperenhancement identified in the left psoas muscle, potentially a vascular malformation or nerve sheath tumor, but likely benign. Follow-up MRI in 3-6 months could be used to ensure stability as clinically warranted. 3. Hepatic steatosis. 4. 17 mm left adrenal adenoma.   CTAP 10/25/2023 1. Biliary dilatation extending all the way to the ampullary region, with mild distal pancreatic duct dilatation. Although some of this Sarahann Horrell be a physiologic response to prior cholecystectomy, this biliary prominence was not present on 02/03/2015 despite that also being a post cholecystectomy examination. Possibility of obstruction in the vicinity of the ampulla or distal pancreatic head is not excluded. Consider MRI abdomen with and without contrast with special attention to the pancreas ampulla, to include MRCP for further characterization. 2. Benign left adrenal adenoma. No further imaging workup of this lesion is indicated. 3. Disc bulges at L3-4 and L4-5. 4. Aortic atherosclerosis.  GI Procedures and Studies  EGD 10/2023 Mild gastritis, otherwise normal   Colonoscopy 2019/2020 - per pt report Normal   EGD 09/2014 3 ulcers in the duodenal bulb, largest measuring 1 cm, o/w normal  Assessment: 1. ***  Plan: 1.  ***   Thank you for the courtesy of this consult. Please call me with any questions or concerns.   Jase Reep, FNP-C [facility] 04/30/2024, 9:48 PM  Cc: Rosalea Rosina SAILOR, PA

## 2024-05-01 ENCOUNTER — Ambulatory Visit: Admitting: Gastroenterology

## 2024-05-11 ENCOUNTER — Other Ambulatory Visit: Payer: Self-pay | Admitting: Pediatrics

## 2024-05-17 DIAGNOSIS — G90521 Complex regional pain syndrome I of right lower limb: Secondary | ICD-10-CM | POA: Diagnosis not present

## 2024-09-23 ENCOUNTER — Other Ambulatory Visit (HOSPITAL_COMMUNITY): Payer: Self-pay
